# Patient Record
Sex: Female | Born: 1935 | Race: White | Hispanic: No | Smoking: Never smoker
Health system: Southern US, Community
[De-identification: ages and names within clinical notes are randomized; demographics above are authoritative.]

## PROBLEM LIST (undated history)

## (undated) DIAGNOSIS — R350 Frequency of micturition: Secondary | ICD-10-CM

## (undated) DIAGNOSIS — M51379 Other intervertebral disc degeneration, lumbosacral region without mention of lumbar back pain or lower extremity pain: Secondary | ICD-10-CM

## (undated) DIAGNOSIS — M810 Age-related osteoporosis without current pathological fracture: Secondary | ICD-10-CM

## (undated) DIAGNOSIS — R42 Dizziness and giddiness: Secondary | ICD-10-CM

## (undated) DIAGNOSIS — IMO0001 Reserved for inherently not codable concepts without codable children: Secondary | ICD-10-CM

## (undated) DIAGNOSIS — J309 Allergic rhinitis, unspecified: Secondary | ICD-10-CM

## (undated) DIAGNOSIS — M549 Dorsalgia, unspecified: Secondary | ICD-10-CM

## (undated) DIAGNOSIS — R55 Syncope and collapse: Secondary | ICD-10-CM

## (undated) DIAGNOSIS — I10 Essential (primary) hypertension: Secondary | ICD-10-CM

## (undated) DIAGNOSIS — R51 Headache: Secondary | ICD-10-CM

## (undated) DIAGNOSIS — K5732 Diverticulitis of large intestine without perforation or abscess without bleeding: Secondary | ICD-10-CM

## (undated) DIAGNOSIS — M6530 Trigger finger, unspecified finger: Secondary | ICD-10-CM

## (undated) DIAGNOSIS — E785 Hyperlipidemia, unspecified: Secondary | ICD-10-CM

## (undated) DIAGNOSIS — N3289 Other specified disorders of bladder: Secondary | ICD-10-CM

## (undated) DIAGNOSIS — M1990 Unspecified osteoarthritis, unspecified site: Secondary | ICD-10-CM

## (undated) DIAGNOSIS — R778 Other specified abnormalities of plasma proteins: Secondary | ICD-10-CM

## (undated) DIAGNOSIS — G4730 Sleep apnea, unspecified: Secondary | ICD-10-CM

## (undated) DIAGNOSIS — I700 Atherosclerosis of aorta: Secondary | ICD-10-CM

## (undated) DIAGNOSIS — M5137 Other intervertebral disc degeneration, lumbosacral region: Secondary | ICD-10-CM

## (undated) DIAGNOSIS — J3489 Other specified disorders of nose and nasal sinuses: Secondary | ICD-10-CM

## (undated) DIAGNOSIS — F341 Dysthymic disorder: Secondary | ICD-10-CM

## (undated) HISTORY — DX: Headache: R51

## (undated) HISTORY — DX: Reserved for inherently not codable concepts without codable children: IMO0001

## (undated) HISTORY — PX: CARDIAC CATHETERIZATION: SHX172

## (undated) HISTORY — PX: ADENOIDECTOMY: SUR15

## (undated) HISTORY — PX: CHOLECYSTECTOMY: SHX55

## (undated) HISTORY — PX: CATARACT EXTRACTION W/ INTRAOCULAR LENS IMPLANT & ANTERIOR VITRECTOMY, BILATERAL: SHX1310

## (undated) HISTORY — PX: OTHER SURGICAL HISTORY: SHX169

## (undated) HISTORY — DX: Other specified abnormalities of plasma proteins: R77.8

## (undated) HISTORY — DX: Other specified disorders of nose and nasal sinuses: J34.89

## (undated) HISTORY — PX: BREAST BIOPSY: SHX20

## (undated) HISTORY — PX: ROTATOR CUFF REPAIR: SHX139

## (undated) HISTORY — PX: TONSILLECTOMY: SUR1361

## (undated) HISTORY — PX: CARPAL TUNNEL RELEASE: SHX101

## (undated) HISTORY — DX: Diverticulitis of large intestine without perforation or abscess without bleeding: K57.32

## (undated) HISTORY — DX: Sleep apnea, unspecified: G47.30

## (undated) HISTORY — DX: Allergic rhinitis, unspecified: J30.9

## (undated) HISTORY — PX: TEMPORAL ARTERY BIOPSY / LIGATION: SUR132

## (undated) HISTORY — DX: Dizziness and giddiness: R42

## (undated) HISTORY — DX: Hyperlipidemia, unspecified: E78.5

## (undated) HISTORY — DX: Other specified disorders of bladder: N32.89

## (undated) HISTORY — DX: Other intervertebral disc degeneration, lumbosacral region: M51.37

## (undated) HISTORY — PX: ABDOMINAL HYSTERECTOMY: SHX81

## (undated) HISTORY — DX: Trigger finger, unspecified finger: M65.30

## (undated) HISTORY — DX: Other intervertebral disc degeneration, lumbosacral region without mention of lumbar back pain or lower extremity pain: M51.379

## (undated) HISTORY — DX: Dysthymic disorder: F34.1

## (undated) HISTORY — DX: Essential (primary) hypertension: I10

## (undated) HISTORY — PX: BREAST EXCISIONAL BIOPSY: SUR124

## (undated) HISTORY — DX: Frequency of micturition: R35.0

## (undated) HISTORY — PX: TUBAL LIGATION: SHX77

## (undated) HISTORY — DX: Atherosclerosis of aorta: I70.0

## (undated) HISTORY — PX: APPENDECTOMY: SHX54

## (undated) HISTORY — DX: Age-related osteoporosis without current pathological fracture: M81.0

---

## 2004-04-18 ENCOUNTER — Other Ambulatory Visit: Payer: Self-pay

## 2005-05-07 ENCOUNTER — Ambulatory Visit: Payer: Self-pay | Admitting: Anesthesiology

## 2005-06-27 ENCOUNTER — Ambulatory Visit: Payer: Self-pay | Admitting: Unknown Physician Specialty

## 2005-06-27 DIAGNOSIS — Z86010 Personal history of colonic polyps: Secondary | ICD-10-CM | POA: Insufficient documentation

## 2005-08-12 ENCOUNTER — Ambulatory Visit: Payer: Self-pay | Admitting: Anesthesiology

## 2005-10-31 ENCOUNTER — Ambulatory Visit: Payer: Self-pay | Admitting: Internal Medicine

## 2005-11-04 ENCOUNTER — Ambulatory Visit: Payer: Self-pay | Admitting: Anesthesiology

## 2006-01-28 ENCOUNTER — Ambulatory Visit: Payer: Self-pay | Admitting: Anesthesiology

## 2006-02-03 ENCOUNTER — Ambulatory Visit: Payer: Self-pay | Admitting: Anesthesiology

## 2006-02-12 ENCOUNTER — Ambulatory Visit: Payer: Self-pay | Admitting: Anesthesiology

## 2006-02-26 ENCOUNTER — Ambulatory Visit: Payer: Self-pay | Admitting: Anesthesiology

## 2006-04-22 ENCOUNTER — Ambulatory Visit: Payer: Self-pay | Admitting: Anesthesiology

## 2006-07-14 ENCOUNTER — Ambulatory Visit: Payer: Self-pay | Admitting: Anesthesiology

## 2006-09-07 ENCOUNTER — Ambulatory Visit: Payer: Self-pay | Admitting: Anesthesiology

## 2006-10-22 ENCOUNTER — Ambulatory Visit: Payer: Self-pay | Admitting: Anesthesiology

## 2006-11-03 ENCOUNTER — Ambulatory Visit: Payer: Self-pay | Admitting: Anesthesiology

## 2007-02-18 ENCOUNTER — Ambulatory Visit: Payer: Self-pay | Admitting: Anesthesiology

## 2007-04-13 ENCOUNTER — Ambulatory Visit: Payer: Self-pay | Admitting: Anesthesiology

## 2007-07-22 ENCOUNTER — Ambulatory Visit: Payer: Self-pay | Admitting: Urology

## 2007-09-20 ENCOUNTER — Ambulatory Visit: Payer: Self-pay | Admitting: Anesthesiology

## 2007-10-12 ENCOUNTER — Ambulatory Visit: Payer: Self-pay | Admitting: Anesthesiology

## 2007-11-03 ENCOUNTER — Ambulatory Visit: Payer: Self-pay | Admitting: Anesthesiology

## 2007-12-01 ENCOUNTER — Ambulatory Visit: Payer: Self-pay | Admitting: Anesthesiology

## 2007-12-16 ENCOUNTER — Ambulatory Visit: Payer: Self-pay | Admitting: Anesthesiology

## 2007-12-22 ENCOUNTER — Ambulatory Visit: Payer: Self-pay | Admitting: Unknown Physician Specialty

## 2008-01-17 ENCOUNTER — Ambulatory Visit: Payer: Self-pay | Admitting: Anesthesiology

## 2008-02-09 ENCOUNTER — Encounter: Payer: Self-pay | Admitting: Anesthesiology

## 2008-02-16 ENCOUNTER — Encounter: Payer: Self-pay | Admitting: Anesthesiology

## 2008-04-18 ENCOUNTER — Ambulatory Visit: Payer: Self-pay | Admitting: Anesthesiology

## 2008-08-22 ENCOUNTER — Encounter: Payer: Self-pay | Admitting: General Practice

## 2008-09-17 ENCOUNTER — Encounter: Payer: Self-pay | Admitting: General Practice

## 2008-09-27 ENCOUNTER — Ambulatory Visit: Payer: Self-pay | Admitting: Anesthesiology

## 2009-01-10 ENCOUNTER — Ambulatory Visit: Payer: Self-pay | Admitting: Unknown Physician Specialty

## 2009-01-23 ENCOUNTER — Ambulatory Visit: Payer: Self-pay | Admitting: Anesthesiology

## 2009-01-24 ENCOUNTER — Ambulatory Visit: Payer: Self-pay | Admitting: Unknown Physician Specialty

## 2009-04-10 ENCOUNTER — Encounter: Payer: Self-pay | Admitting: General Practice

## 2009-04-17 ENCOUNTER — Encounter: Payer: Self-pay | Admitting: General Practice

## 2009-05-29 ENCOUNTER — Ambulatory Visit: Payer: Self-pay | Admitting: Anesthesiology

## 2009-07-04 ENCOUNTER — Ambulatory Visit: Payer: Self-pay | Admitting: Anesthesiology

## 2009-08-14 ENCOUNTER — Ambulatory Visit: Payer: Self-pay | Admitting: Anesthesiology

## 2009-09-11 ENCOUNTER — Ambulatory Visit: Payer: Self-pay | Admitting: Anesthesiology

## 2009-12-06 ENCOUNTER — Ambulatory Visit: Payer: Self-pay | Admitting: Anesthesiology

## 2010-01-09 ENCOUNTER — Ambulatory Visit: Payer: Self-pay | Admitting: Unknown Physician Specialty

## 2010-01-17 ENCOUNTER — Ambulatory Visit: Payer: Self-pay | Admitting: Unknown Physician Specialty

## 2010-01-31 ENCOUNTER — Ambulatory Visit: Payer: Self-pay | Admitting: General Practice

## 2010-02-08 ENCOUNTER — Ambulatory Visit: Payer: Self-pay | Admitting: General Practice

## 2010-12-25 ENCOUNTER — Ambulatory Visit: Payer: Self-pay | Admitting: Anesthesiology

## 2011-01-23 ENCOUNTER — Ambulatory Visit: Payer: Self-pay | Admitting: Anesthesiology

## 2011-01-31 ENCOUNTER — Ambulatory Visit: Payer: Self-pay | Admitting: Unknown Physician Specialty

## 2011-02-12 ENCOUNTER — Ambulatory Visit: Payer: Self-pay | Admitting: Anesthesiology

## 2011-02-27 ENCOUNTER — Ambulatory Visit: Payer: Self-pay | Admitting: Anesthesiology

## 2011-07-26 ENCOUNTER — Ambulatory Visit: Payer: Self-pay | Admitting: Neurology

## 2011-08-27 ENCOUNTER — Emergency Department: Payer: Self-pay | Admitting: Internal Medicine

## 2011-09-10 ENCOUNTER — Ambulatory Visit: Payer: Self-pay | Admitting: Pain Medicine

## 2011-09-14 ENCOUNTER — Ambulatory Visit: Payer: Self-pay | Admitting: Pain Medicine

## 2011-09-18 ENCOUNTER — Ambulatory Visit: Payer: Self-pay | Admitting: Pain Medicine

## 2011-10-06 ENCOUNTER — Ambulatory Visit: Payer: Self-pay | Admitting: Pain Medicine

## 2011-10-22 ENCOUNTER — Ambulatory Visit: Payer: Self-pay | Admitting: Pain Medicine

## 2011-10-23 ENCOUNTER — Ambulatory Visit: Payer: Self-pay | Admitting: Pain Medicine

## 2011-11-05 ENCOUNTER — Ambulatory Visit: Payer: Self-pay | Admitting: Pain Medicine

## 2011-11-06 ENCOUNTER — Ambulatory Visit: Payer: Self-pay | Admitting: Pain Medicine

## 2011-11-24 ENCOUNTER — Ambulatory Visit: Payer: Self-pay | Admitting: Pain Medicine

## 2011-11-24 DIAGNOSIS — G8929 Other chronic pain: Secondary | ICD-10-CM | POA: Diagnosis not present

## 2011-11-24 DIAGNOSIS — M4120 Other idiopathic scoliosis, site unspecified: Secondary | ICD-10-CM | POA: Diagnosis not present

## 2011-11-24 DIAGNOSIS — K219 Gastro-esophageal reflux disease without esophagitis: Secondary | ICD-10-CM | POA: Diagnosis not present

## 2011-11-24 DIAGNOSIS — Z7982 Long term (current) use of aspirin: Secondary | ICD-10-CM | POA: Diagnosis not present

## 2011-11-24 DIAGNOSIS — M19019 Primary osteoarthritis, unspecified shoulder: Secondary | ICD-10-CM | POA: Diagnosis not present

## 2011-11-24 DIAGNOSIS — D649 Anemia, unspecified: Secondary | ICD-10-CM | POA: Diagnosis not present

## 2011-11-24 DIAGNOSIS — G4733 Obstructive sleep apnea (adult) (pediatric): Secondary | ICD-10-CM | POA: Diagnosis not present

## 2011-11-24 DIAGNOSIS — M5106 Intervertebral disc disorders with myelopathy, lumbar region: Secondary | ICD-10-CM | POA: Diagnosis not present

## 2011-11-24 DIAGNOSIS — M79609 Pain in unspecified limb: Secondary | ICD-10-CM | POA: Diagnosis not present

## 2011-11-24 DIAGNOSIS — M13819 Other specified arthritis, unspecified shoulder: Secondary | ICD-10-CM | POA: Diagnosis not present

## 2011-11-24 DIAGNOSIS — Z9889 Other specified postprocedural states: Secondary | ICD-10-CM | POA: Diagnosis not present

## 2011-11-24 DIAGNOSIS — F329 Major depressive disorder, single episode, unspecified: Secondary | ICD-10-CM | POA: Diagnosis not present

## 2011-11-24 DIAGNOSIS — K279 Peptic ulcer, site unspecified, unspecified as acute or chronic, without hemorrhage or perforation: Secondary | ICD-10-CM | POA: Diagnosis not present

## 2011-11-24 DIAGNOSIS — M5000 Cervical disc disorder with myelopathy, unspecified cervical region: Secondary | ICD-10-CM | POA: Diagnosis not present

## 2011-11-24 DIAGNOSIS — I10 Essential (primary) hypertension: Secondary | ICD-10-CM | POA: Diagnosis not present

## 2011-11-24 DIAGNOSIS — IMO0002 Reserved for concepts with insufficient information to code with codable children: Secondary | ICD-10-CM | POA: Diagnosis not present

## 2011-11-24 DIAGNOSIS — Z6372 Alcoholism and drug addiction in family: Secondary | ICD-10-CM | POA: Diagnosis not present

## 2011-11-24 DIAGNOSIS — M545 Low back pain, unspecified: Secondary | ICD-10-CM | POA: Diagnosis not present

## 2011-11-24 DIAGNOSIS — M25519 Pain in unspecified shoulder: Secondary | ICD-10-CM | POA: Diagnosis not present

## 2011-11-24 DIAGNOSIS — Z79899 Other long term (current) drug therapy: Secondary | ICD-10-CM | POA: Diagnosis not present

## 2011-11-24 DIAGNOSIS — M542 Cervicalgia: Secondary | ICD-10-CM | POA: Diagnosis not present

## 2011-12-09 DIAGNOSIS — R1310 Dysphagia, unspecified: Secondary | ICD-10-CM | POA: Diagnosis not present

## 2011-12-09 DIAGNOSIS — J029 Acute pharyngitis, unspecified: Secondary | ICD-10-CM | POA: Diagnosis not present

## 2011-12-09 DIAGNOSIS — M129 Arthropathy, unspecified: Secondary | ICD-10-CM | POA: Diagnosis not present

## 2011-12-09 DIAGNOSIS — I10 Essential (primary) hypertension: Secondary | ICD-10-CM | POA: Diagnosis not present

## 2011-12-15 DIAGNOSIS — M531 Cervicobrachial syndrome: Secondary | ICD-10-CM | POA: Diagnosis not present

## 2012-02-20 ENCOUNTER — Ambulatory Visit: Payer: Self-pay | Admitting: Family Medicine

## 2012-02-20 DIAGNOSIS — Z1231 Encounter for screening mammogram for malignant neoplasm of breast: Secondary | ICD-10-CM | POA: Diagnosis not present

## 2012-02-20 DIAGNOSIS — R928 Other abnormal and inconclusive findings on diagnostic imaging of breast: Secondary | ICD-10-CM | POA: Diagnosis not present

## 2012-03-10 DIAGNOSIS — E559 Vitamin D deficiency, unspecified: Secondary | ICD-10-CM | POA: Diagnosis not present

## 2012-03-10 DIAGNOSIS — E785 Hyperlipidemia, unspecified: Secondary | ICD-10-CM | POA: Diagnosis not present

## 2012-03-10 DIAGNOSIS — I10 Essential (primary) hypertension: Secondary | ICD-10-CM | POA: Diagnosis not present

## 2012-03-10 DIAGNOSIS — IMO0001 Reserved for inherently not codable concepts without codable children: Secondary | ICD-10-CM | POA: Diagnosis not present

## 2012-03-10 DIAGNOSIS — R252 Cramp and spasm: Secondary | ICD-10-CM | POA: Diagnosis not present

## 2012-03-10 DIAGNOSIS — R51 Headache: Secondary | ICD-10-CM | POA: Diagnosis not present

## 2012-03-10 DIAGNOSIS — R9389 Abnormal findings on diagnostic imaging of other specified body structures: Secondary | ICD-10-CM | POA: Diagnosis not present

## 2012-03-22 ENCOUNTER — Encounter: Payer: Self-pay | Admitting: Family Medicine

## 2012-03-22 DIAGNOSIS — M542 Cervicalgia: Secondary | ICD-10-CM | POA: Diagnosis not present

## 2012-03-22 DIAGNOSIS — IMO0001 Reserved for inherently not codable concepts without codable children: Secondary | ICD-10-CM | POA: Diagnosis not present

## 2012-03-22 DIAGNOSIS — M2560 Stiffness of unspecified joint, not elsewhere classified: Secondary | ICD-10-CM | POA: Diagnosis not present

## 2012-03-23 ENCOUNTER — Ambulatory Visit: Payer: Self-pay | Admitting: Family Medicine

## 2012-03-23 DIAGNOSIS — M47812 Spondylosis without myelopathy or radiculopathy, cervical region: Secondary | ICD-10-CM | POA: Diagnosis not present

## 2012-03-23 DIAGNOSIS — M5030 Other cervical disc degeneration, unspecified cervical region: Secondary | ICD-10-CM | POA: Diagnosis not present

## 2012-03-26 DIAGNOSIS — M19049 Primary osteoarthritis, unspecified hand: Secondary | ICD-10-CM | POA: Diagnosis not present

## 2012-03-26 DIAGNOSIS — G5600 Carpal tunnel syndrome, unspecified upper limb: Secondary | ICD-10-CM | POA: Diagnosis not present

## 2012-03-26 DIAGNOSIS — M19039 Primary osteoarthritis, unspecified wrist: Secondary | ICD-10-CM | POA: Diagnosis not present

## 2012-03-30 ENCOUNTER — Ambulatory Visit: Payer: Self-pay | Admitting: Family Medicine

## 2012-03-30 DIAGNOSIS — R937 Abnormal findings on diagnostic imaging of other parts of musculoskeletal system: Secondary | ICD-10-CM | POA: Diagnosis not present

## 2012-03-30 DIAGNOSIS — G5630 Lesion of radial nerve, unspecified upper limb: Secondary | ICD-10-CM | POA: Diagnosis not present

## 2012-03-30 DIAGNOSIS — E559 Vitamin D deficiency, unspecified: Secondary | ICD-10-CM | POA: Diagnosis not present

## 2012-03-30 DIAGNOSIS — G5600 Carpal tunnel syndrome, unspecified upper limb: Secondary | ICD-10-CM | POA: Diagnosis not present

## 2012-03-30 DIAGNOSIS — M899 Disorder of bone, unspecified: Secondary | ICD-10-CM | POA: Diagnosis not present

## 2012-03-30 DIAGNOSIS — M542 Cervicalgia: Secondary | ICD-10-CM | POA: Diagnosis not present

## 2012-03-30 DIAGNOSIS — M810 Age-related osteoporosis without current pathological fracture: Secondary | ICD-10-CM | POA: Diagnosis not present

## 2012-04-08 DIAGNOSIS — G4733 Obstructive sleep apnea (adult) (pediatric): Secondary | ICD-10-CM | POA: Diagnosis not present

## 2012-04-17 ENCOUNTER — Encounter: Payer: Self-pay | Admitting: Family Medicine

## 2012-04-22 DIAGNOSIS — Z961 Presence of intraocular lens: Secondary | ICD-10-CM | POA: Diagnosis not present

## 2012-04-22 DIAGNOSIS — H2510 Age-related nuclear cataract, unspecified eye: Secondary | ICD-10-CM | POA: Diagnosis not present

## 2012-05-26 DIAGNOSIS — R142 Eructation: Secondary | ICD-10-CM | POA: Diagnosis not present

## 2012-05-26 DIAGNOSIS — I10 Essential (primary) hypertension: Secondary | ICD-10-CM | POA: Diagnosis not present

## 2012-05-26 DIAGNOSIS — Z86010 Personal history of colonic polyps: Secondary | ICD-10-CM | POA: Diagnosis not present

## 2012-05-26 DIAGNOSIS — R198 Other specified symptoms and signs involving the digestive system and abdomen: Secondary | ICD-10-CM | POA: Diagnosis not present

## 2012-05-26 DIAGNOSIS — R141 Gas pain: Secondary | ICD-10-CM | POA: Diagnosis not present

## 2012-05-26 DIAGNOSIS — E785 Hyperlipidemia, unspecified: Secondary | ICD-10-CM | POA: Diagnosis not present

## 2012-05-26 DIAGNOSIS — R143 Flatulence: Secondary | ICD-10-CM | POA: Diagnosis not present

## 2012-05-31 ENCOUNTER — Ambulatory Visit: Payer: Self-pay | Admitting: Family Medicine

## 2012-05-31 DIAGNOSIS — R141 Gas pain: Secondary | ICD-10-CM | POA: Diagnosis not present

## 2012-05-31 DIAGNOSIS — R143 Flatulence: Secondary | ICD-10-CM | POA: Diagnosis not present

## 2012-08-13 DIAGNOSIS — M76899 Other specified enthesopathies of unspecified lower limb, excluding foot: Secondary | ICD-10-CM | POA: Diagnosis not present

## 2012-08-30 DIAGNOSIS — F341 Dysthymic disorder: Secondary | ICD-10-CM | POA: Diagnosis not present

## 2012-08-30 DIAGNOSIS — M5137 Other intervertebral disc degeneration, lumbosacral region: Secondary | ICD-10-CM | POA: Diagnosis not present

## 2012-08-30 DIAGNOSIS — I10 Essential (primary) hypertension: Secondary | ICD-10-CM | POA: Diagnosis not present

## 2012-08-30 DIAGNOSIS — E785 Hyperlipidemia, unspecified: Secondary | ICD-10-CM | POA: Diagnosis not present

## 2012-08-31 DIAGNOSIS — M25559 Pain in unspecified hip: Secondary | ICD-10-CM | POA: Diagnosis not present

## 2012-08-31 DIAGNOSIS — IMO0001 Reserved for inherently not codable concepts without codable children: Secondary | ICD-10-CM | POA: Diagnosis not present

## 2012-08-31 DIAGNOSIS — M6281 Muscle weakness (generalized): Secondary | ICD-10-CM | POA: Diagnosis not present

## 2012-08-31 DIAGNOSIS — R262 Difficulty in walking, not elsewhere classified: Secondary | ICD-10-CM | POA: Diagnosis not present

## 2012-09-02 DIAGNOSIS — L821 Other seborrheic keratosis: Secondary | ICD-10-CM | POA: Diagnosis not present

## 2012-09-02 DIAGNOSIS — D1800 Hemangioma unspecified site: Secondary | ICD-10-CM | POA: Diagnosis not present

## 2012-09-02 DIAGNOSIS — L723 Sebaceous cyst: Secondary | ICD-10-CM | POA: Diagnosis not present

## 2012-09-09 DIAGNOSIS — Z23 Encounter for immunization: Secondary | ICD-10-CM | POA: Diagnosis not present

## 2012-11-30 DIAGNOSIS — E785 Hyperlipidemia, unspecified: Secondary | ICD-10-CM | POA: Diagnosis not present

## 2012-11-30 DIAGNOSIS — M542 Cervicalgia: Secondary | ICD-10-CM | POA: Diagnosis not present

## 2012-11-30 DIAGNOSIS — J309 Allergic rhinitis, unspecified: Secondary | ICD-10-CM | POA: Diagnosis not present

## 2012-11-30 DIAGNOSIS — I10 Essential (primary) hypertension: Secondary | ICD-10-CM | POA: Diagnosis not present

## 2012-11-30 DIAGNOSIS — R198 Other specified symptoms and signs involving the digestive system and abdomen: Secondary | ICD-10-CM | POA: Diagnosis not present

## 2012-12-07 DIAGNOSIS — M542 Cervicalgia: Secondary | ICD-10-CM | POA: Diagnosis not present

## 2012-12-07 DIAGNOSIS — M899 Disorder of bone, unspecified: Secondary | ICD-10-CM | POA: Diagnosis not present

## 2012-12-07 DIAGNOSIS — E785 Hyperlipidemia, unspecified: Secondary | ICD-10-CM | POA: Diagnosis not present

## 2012-12-07 DIAGNOSIS — F341 Dysthymic disorder: Secondary | ICD-10-CM | POA: Diagnosis not present

## 2012-12-07 DIAGNOSIS — M949 Disorder of cartilage, unspecified: Secondary | ICD-10-CM | POA: Diagnosis not present

## 2012-12-13 ENCOUNTER — Ambulatory Visit: Payer: Self-pay | Admitting: Family Medicine

## 2012-12-13 DIAGNOSIS — M47812 Spondylosis without myelopathy or radiculopathy, cervical region: Secondary | ICD-10-CM | POA: Diagnosis not present

## 2012-12-13 DIAGNOSIS — M542 Cervicalgia: Secondary | ICD-10-CM | POA: Diagnosis not present

## 2012-12-13 DIAGNOSIS — M5020 Other cervical disc displacement, unspecified cervical region: Secondary | ICD-10-CM | POA: Diagnosis not present

## 2012-12-21 DIAGNOSIS — R110 Nausea: Secondary | ICD-10-CM | POA: Diagnosis not present

## 2012-12-21 DIAGNOSIS — K5900 Constipation, unspecified: Secondary | ICD-10-CM | POA: Diagnosis not present

## 2012-12-21 DIAGNOSIS — Z86010 Personal history of colonic polyps: Secondary | ICD-10-CM | POA: Diagnosis not present

## 2012-12-29 ENCOUNTER — Ambulatory Visit: Payer: Self-pay | Admitting: Pain Medicine

## 2012-12-29 DIAGNOSIS — M5030 Other cervical disc degeneration, unspecified cervical region: Secondary | ICD-10-CM | POA: Diagnosis not present

## 2012-12-29 DIAGNOSIS — M4712 Other spondylosis with myelopathy, cervical region: Secondary | ICD-10-CM | POA: Diagnosis not present

## 2012-12-29 DIAGNOSIS — G8929 Other chronic pain: Secondary | ICD-10-CM | POA: Diagnosis not present

## 2012-12-29 DIAGNOSIS — Z6379 Other stressful life events affecting family and household: Secondary | ICD-10-CM | POA: Diagnosis not present

## 2012-12-29 DIAGNOSIS — G894 Chronic pain syndrome: Secondary | ICD-10-CM | POA: Diagnosis not present

## 2012-12-29 DIAGNOSIS — K589 Irritable bowel syndrome without diarrhea: Secondary | ICD-10-CM | POA: Diagnosis not present

## 2012-12-29 DIAGNOSIS — G4733 Obstructive sleep apnea (adult) (pediatric): Secondary | ICD-10-CM | POA: Diagnosis not present

## 2012-12-29 DIAGNOSIS — M5412 Radiculopathy, cervical region: Secondary | ICD-10-CM | POA: Diagnosis not present

## 2012-12-29 DIAGNOSIS — K219 Gastro-esophageal reflux disease without esophagitis: Secondary | ICD-10-CM | POA: Diagnosis not present

## 2012-12-29 DIAGNOSIS — Z7982 Long term (current) use of aspirin: Secondary | ICD-10-CM | POA: Diagnosis not present

## 2012-12-29 DIAGNOSIS — K279 Peptic ulcer, site unspecified, unspecified as acute or chronic, without hemorrhage or perforation: Secondary | ICD-10-CM | POA: Diagnosis not present

## 2012-12-29 DIAGNOSIS — M1990 Unspecified osteoarthritis, unspecified site: Secondary | ICD-10-CM | POA: Diagnosis not present

## 2012-12-29 DIAGNOSIS — M5137 Other intervertebral disc degeneration, lumbosacral region: Secondary | ICD-10-CM | POA: Diagnosis not present

## 2012-12-29 DIAGNOSIS — M4120 Other idiopathic scoliosis, site unspecified: Secondary | ICD-10-CM | POA: Diagnosis not present

## 2012-12-29 DIAGNOSIS — M4716 Other spondylosis with myelopathy, lumbar region: Secondary | ICD-10-CM | POA: Diagnosis not present

## 2012-12-29 DIAGNOSIS — Z862 Personal history of diseases of the blood and blood-forming organs and certain disorders involving the immune mechanism: Secondary | ICD-10-CM | POA: Diagnosis not present

## 2012-12-29 DIAGNOSIS — F329 Major depressive disorder, single episode, unspecified: Secondary | ICD-10-CM | POA: Diagnosis not present

## 2012-12-29 DIAGNOSIS — I10 Essential (primary) hypertension: Secondary | ICD-10-CM | POA: Diagnosis not present

## 2012-12-29 DIAGNOSIS — M542 Cervicalgia: Secondary | ICD-10-CM | POA: Diagnosis not present

## 2012-12-29 DIAGNOSIS — Z9889 Other specified postprocedural states: Secondary | ICD-10-CM | POA: Diagnosis not present

## 2013-01-04 DIAGNOSIS — M531 Cervicobrachial syndrome: Secondary | ICD-10-CM | POA: Diagnosis not present

## 2013-01-06 ENCOUNTER — Ambulatory Visit: Payer: Self-pay | Admitting: Pain Medicine

## 2013-01-06 DIAGNOSIS — F411 Generalized anxiety disorder: Secondary | ICD-10-CM | POA: Diagnosis not present

## 2013-01-06 DIAGNOSIS — M5412 Radiculopathy, cervical region: Secondary | ICD-10-CM | POA: Diagnosis not present

## 2013-01-06 DIAGNOSIS — F331 Major depressive disorder, recurrent, moderate: Secondary | ICD-10-CM | POA: Diagnosis not present

## 2013-01-18 DIAGNOSIS — M531 Cervicobrachial syndrome: Secondary | ICD-10-CM | POA: Diagnosis not present

## 2013-01-24 ENCOUNTER — Ambulatory Visit: Payer: Self-pay | Admitting: Unknown Physician Specialty

## 2013-01-24 DIAGNOSIS — Z8371 Family history of colonic polyps: Secondary | ICD-10-CM | POA: Diagnosis not present

## 2013-01-24 DIAGNOSIS — K2970 Gastritis, unspecified, without bleeding: Secondary | ICD-10-CM | POA: Diagnosis not present

## 2013-01-24 DIAGNOSIS — Z79899 Other long term (current) drug therapy: Secondary | ICD-10-CM | POA: Diagnosis not present

## 2013-01-24 DIAGNOSIS — Z885 Allergy status to narcotic agent status: Secondary | ICD-10-CM | POA: Diagnosis not present

## 2013-01-24 DIAGNOSIS — D649 Anemia, unspecified: Secondary | ICD-10-CM | POA: Diagnosis not present

## 2013-01-24 DIAGNOSIS — E785 Hyperlipidemia, unspecified: Secondary | ICD-10-CM | POA: Diagnosis not present

## 2013-01-24 DIAGNOSIS — Z7982 Long term (current) use of aspirin: Secondary | ICD-10-CM | POA: Diagnosis not present

## 2013-01-24 DIAGNOSIS — Z860100 Personal history of colon polyps, unspecified: Secondary | ICD-10-CM | POA: Diagnosis not present

## 2013-01-24 DIAGNOSIS — K449 Diaphragmatic hernia without obstruction or gangrene: Secondary | ICD-10-CM | POA: Diagnosis not present

## 2013-01-24 DIAGNOSIS — G4730 Sleep apnea, unspecified: Secondary | ICD-10-CM | POA: Diagnosis not present

## 2013-01-24 DIAGNOSIS — D126 Benign neoplasm of colon, unspecified: Secondary | ICD-10-CM | POA: Diagnosis not present

## 2013-01-24 DIAGNOSIS — Z881 Allergy status to other antibiotic agents status: Secondary | ICD-10-CM | POA: Diagnosis not present

## 2013-01-24 DIAGNOSIS — K219 Gastro-esophageal reflux disease without esophagitis: Secondary | ICD-10-CM | POA: Diagnosis not present

## 2013-01-24 DIAGNOSIS — I10 Essential (primary) hypertension: Secondary | ICD-10-CM | POA: Diagnosis not present

## 2013-01-24 DIAGNOSIS — M549 Dorsalgia, unspecified: Secondary | ICD-10-CM | POA: Diagnosis not present

## 2013-01-24 DIAGNOSIS — R198 Other specified symptoms and signs involving the digestive system and abdomen: Secondary | ICD-10-CM | POA: Diagnosis not present

## 2013-01-24 DIAGNOSIS — F329 Major depressive disorder, single episode, unspecified: Secondary | ICD-10-CM | POA: Diagnosis not present

## 2013-01-24 DIAGNOSIS — K5900 Constipation, unspecified: Secondary | ICD-10-CM | POA: Diagnosis not present

## 2013-01-24 DIAGNOSIS — Z09 Encounter for follow-up examination after completed treatment for conditions other than malignant neoplasm: Secondary | ICD-10-CM | POA: Diagnosis not present

## 2013-01-24 DIAGNOSIS — Z888 Allergy status to other drugs, medicaments and biological substances status: Secondary | ICD-10-CM | POA: Diagnosis not present

## 2013-01-24 DIAGNOSIS — K589 Irritable bowel syndrome without diarrhea: Secondary | ICD-10-CM | POA: Diagnosis not present

## 2013-01-24 DIAGNOSIS — R110 Nausea: Secondary | ICD-10-CM | POA: Diagnosis not present

## 2013-01-24 DIAGNOSIS — F411 Generalized anxiety disorder: Secondary | ICD-10-CM | POA: Diagnosis not present

## 2013-01-24 DIAGNOSIS — K5730 Diverticulosis of large intestine without perforation or abscess without bleeding: Secondary | ICD-10-CM | POA: Diagnosis not present

## 2013-01-24 DIAGNOSIS — K2940 Chronic atrophic gastritis without bleeding: Secondary | ICD-10-CM | POA: Diagnosis not present

## 2013-01-24 DIAGNOSIS — Z886 Allergy status to analgesic agent status: Secondary | ICD-10-CM | POA: Diagnosis not present

## 2013-01-26 LAB — PATHOLOGY REPORT

## 2013-01-27 ENCOUNTER — Ambulatory Visit: Payer: Self-pay | Admitting: Pain Medicine

## 2013-01-27 DIAGNOSIS — IMO0002 Reserved for concepts with insufficient information to code with codable children: Secondary | ICD-10-CM | POA: Diagnosis not present

## 2013-01-28 DIAGNOSIS — F411 Generalized anxiety disorder: Secondary | ICD-10-CM | POA: Diagnosis not present

## 2013-01-28 DIAGNOSIS — F331 Major depressive disorder, recurrent, moderate: Secondary | ICD-10-CM | POA: Diagnosis not present

## 2013-02-09 ENCOUNTER — Ambulatory Visit: Payer: Self-pay | Admitting: Pain Medicine

## 2013-02-09 DIAGNOSIS — M5030 Other cervical disc degeneration, unspecified cervical region: Secondary | ICD-10-CM | POA: Diagnosis not present

## 2013-02-09 DIAGNOSIS — M542 Cervicalgia: Secondary | ICD-10-CM | POA: Diagnosis not present

## 2013-02-09 DIAGNOSIS — M4716 Other spondylosis with myelopathy, lumbar region: Secondary | ICD-10-CM | POA: Diagnosis not present

## 2013-02-09 DIAGNOSIS — F329 Major depressive disorder, single episode, unspecified: Secondary | ICD-10-CM | POA: Diagnosis not present

## 2013-02-09 DIAGNOSIS — G894 Chronic pain syndrome: Secondary | ICD-10-CM | POA: Diagnosis not present

## 2013-02-09 DIAGNOSIS — I10 Essential (primary) hypertension: Secondary | ICD-10-CM | POA: Diagnosis not present

## 2013-02-09 DIAGNOSIS — M5137 Other intervertebral disc degeneration, lumbosacral region: Secondary | ICD-10-CM | POA: Diagnosis not present

## 2013-02-09 DIAGNOSIS — M4712 Other spondylosis with myelopathy, cervical region: Secondary | ICD-10-CM | POA: Diagnosis not present

## 2013-02-09 DIAGNOSIS — M4120 Other idiopathic scoliosis, site unspecified: Secondary | ICD-10-CM | POA: Diagnosis not present

## 2013-02-09 DIAGNOSIS — K219 Gastro-esophageal reflux disease without esophagitis: Secondary | ICD-10-CM | POA: Diagnosis not present

## 2013-02-09 DIAGNOSIS — G8929 Other chronic pain: Secondary | ICD-10-CM | POA: Diagnosis not present

## 2013-02-09 DIAGNOSIS — Z862 Personal history of diseases of the blood and blood-forming organs and certain disorders involving the immune mechanism: Secondary | ICD-10-CM | POA: Diagnosis not present

## 2013-02-09 DIAGNOSIS — Z6379 Other stressful life events affecting family and household: Secondary | ICD-10-CM | POA: Diagnosis not present

## 2013-02-09 DIAGNOSIS — M5412 Radiculopathy, cervical region: Secondary | ICD-10-CM | POA: Diagnosis not present

## 2013-02-09 DIAGNOSIS — K279 Peptic ulcer, site unspecified, unspecified as acute or chronic, without hemorrhage or perforation: Secondary | ICD-10-CM | POA: Diagnosis not present

## 2013-02-09 DIAGNOSIS — G4733 Obstructive sleep apnea (adult) (pediatric): Secondary | ICD-10-CM | POA: Diagnosis not present

## 2013-02-14 DIAGNOSIS — K2940 Chronic atrophic gastritis without bleeding: Secondary | ICD-10-CM | POA: Diagnosis not present

## 2013-02-14 DIAGNOSIS — Z86010 Personal history of colonic polyps: Secondary | ICD-10-CM | POA: Diagnosis not present

## 2013-02-14 DIAGNOSIS — F411 Generalized anxiety disorder: Secondary | ICD-10-CM | POA: Diagnosis not present

## 2013-02-14 DIAGNOSIS — F331 Major depressive disorder, recurrent, moderate: Secondary | ICD-10-CM | POA: Diagnosis not present

## 2013-02-25 ENCOUNTER — Ambulatory Visit: Payer: Self-pay | Admitting: Family Medicine

## 2013-02-25 DIAGNOSIS — Z1231 Encounter for screening mammogram for malignant neoplasm of breast: Secondary | ICD-10-CM | POA: Diagnosis not present

## 2013-02-25 DIAGNOSIS — R928 Other abnormal and inconclusive findings on diagnostic imaging of breast: Secondary | ICD-10-CM | POA: Diagnosis not present

## 2013-02-28 DIAGNOSIS — G44229 Chronic tension-type headache, not intractable: Secondary | ICD-10-CM | POA: Diagnosis not present

## 2013-02-28 DIAGNOSIS — G4733 Obstructive sleep apnea (adult) (pediatric): Secondary | ICD-10-CM | POA: Diagnosis not present

## 2013-02-28 DIAGNOSIS — M531 Cervicobrachial syndrome: Secondary | ICD-10-CM | POA: Diagnosis not present

## 2013-03-07 DIAGNOSIS — I10 Essential (primary) hypertension: Secondary | ICD-10-CM | POA: Diagnosis not present

## 2013-03-07 DIAGNOSIS — K219 Gastro-esophageal reflux disease without esophagitis: Secondary | ICD-10-CM | POA: Diagnosis not present

## 2013-03-07 DIAGNOSIS — E785 Hyperlipidemia, unspecified: Secondary | ICD-10-CM | POA: Diagnosis not present

## 2013-03-18 DIAGNOSIS — F331 Major depressive disorder, recurrent, moderate: Secondary | ICD-10-CM | POA: Diagnosis not present

## 2013-03-18 DIAGNOSIS — F411 Generalized anxiety disorder: Secondary | ICD-10-CM | POA: Diagnosis not present

## 2013-04-12 DIAGNOSIS — J309 Allergic rhinitis, unspecified: Secondary | ICD-10-CM | POA: Diagnosis not present

## 2013-04-12 DIAGNOSIS — F341 Dysthymic disorder: Secondary | ICD-10-CM | POA: Diagnosis not present

## 2013-04-12 DIAGNOSIS — R6884 Jaw pain: Secondary | ICD-10-CM | POA: Diagnosis not present

## 2013-04-14 DIAGNOSIS — J3489 Other specified disorders of nose and nasal sinuses: Secondary | ICD-10-CM | POA: Diagnosis not present

## 2013-04-14 DIAGNOSIS — J301 Allergic rhinitis due to pollen: Secondary | ICD-10-CM | POA: Diagnosis not present

## 2013-04-14 DIAGNOSIS — K1120 Sialoadenitis, unspecified: Secondary | ICD-10-CM | POA: Diagnosis not present

## 2013-04-21 DIAGNOSIS — L509 Urticaria, unspecified: Secondary | ICD-10-CM | POA: Diagnosis not present

## 2013-04-21 DIAGNOSIS — K1120 Sialoadenitis, unspecified: Secondary | ICD-10-CM | POA: Diagnosis not present

## 2013-04-22 DIAGNOSIS — F411 Generalized anxiety disorder: Secondary | ICD-10-CM | POA: Diagnosis not present

## 2013-04-22 DIAGNOSIS — F331 Major depressive disorder, recurrent, moderate: Secondary | ICD-10-CM | POA: Diagnosis not present

## 2013-05-02 DIAGNOSIS — K1120 Sialoadenitis, unspecified: Secondary | ICD-10-CM | POA: Diagnosis not present

## 2013-05-02 DIAGNOSIS — J301 Allergic rhinitis due to pollen: Secondary | ICD-10-CM | POA: Diagnosis not present

## 2013-05-23 DIAGNOSIS — K1120 Sialoadenitis, unspecified: Secondary | ICD-10-CM | POA: Diagnosis not present

## 2013-05-23 DIAGNOSIS — J301 Allergic rhinitis due to pollen: Secondary | ICD-10-CM | POA: Diagnosis not present

## 2013-06-01 ENCOUNTER — Ambulatory Visit: Payer: Self-pay | Admitting: Otolaryngology

## 2013-06-01 DIAGNOSIS — K1120 Sialoadenitis, unspecified: Secondary | ICD-10-CM | POA: Diagnosis not present

## 2013-06-03 DIAGNOSIS — H43819 Vitreous degeneration, unspecified eye: Secondary | ICD-10-CM | POA: Diagnosis not present

## 2013-06-15 DIAGNOSIS — F329 Major depressive disorder, single episode, unspecified: Secondary | ICD-10-CM | POA: Diagnosis not present

## 2013-06-15 DIAGNOSIS — F331 Major depressive disorder, recurrent, moderate: Secondary | ICD-10-CM | POA: Diagnosis not present

## 2013-06-15 DIAGNOSIS — F411 Generalized anxiety disorder: Secondary | ICD-10-CM | POA: Diagnosis not present

## 2013-06-15 DIAGNOSIS — M531 Cervicobrachial syndrome: Secondary | ICD-10-CM | POA: Diagnosis not present

## 2013-06-17 DIAGNOSIS — K1120 Sialoadenitis, unspecified: Secondary | ICD-10-CM | POA: Diagnosis not present

## 2013-06-17 DIAGNOSIS — J301 Allergic rhinitis due to pollen: Secondary | ICD-10-CM | POA: Diagnosis not present

## 2013-06-21 DIAGNOSIS — N3289 Other specified disorders of bladder: Secondary | ICD-10-CM | POA: Diagnosis not present

## 2013-06-21 DIAGNOSIS — R300 Dysuria: Secondary | ICD-10-CM | POA: Diagnosis not present

## 2013-06-21 DIAGNOSIS — R350 Frequency of micturition: Secondary | ICD-10-CM | POA: Diagnosis not present

## 2013-06-21 DIAGNOSIS — N390 Urinary tract infection, site not specified: Secondary | ICD-10-CM | POA: Diagnosis not present

## 2013-07-04 DIAGNOSIS — R350 Frequency of micturition: Secondary | ICD-10-CM | POA: Diagnosis not present

## 2013-07-06 DIAGNOSIS — K219 Gastro-esophageal reflux disease without esophagitis: Secondary | ICD-10-CM | POA: Diagnosis not present

## 2013-07-06 DIAGNOSIS — I10 Essential (primary) hypertension: Secondary | ICD-10-CM | POA: Diagnosis not present

## 2013-07-06 DIAGNOSIS — M542 Cervicalgia: Secondary | ICD-10-CM | POA: Diagnosis not present

## 2013-07-06 DIAGNOSIS — E785 Hyperlipidemia, unspecified: Secondary | ICD-10-CM | POA: Diagnosis not present

## 2013-08-11 DIAGNOSIS — G5600 Carpal tunnel syndrome, unspecified upper limb: Secondary | ICD-10-CM | POA: Diagnosis not present

## 2013-08-11 DIAGNOSIS — R51 Headache: Secondary | ICD-10-CM | POA: Diagnosis not present

## 2013-08-11 DIAGNOSIS — M531 Cervicobrachial syndrome: Secondary | ICD-10-CM | POA: Diagnosis not present

## 2013-08-29 DIAGNOSIS — R350 Frequency of micturition: Secondary | ICD-10-CM | POA: Diagnosis not present

## 2013-09-01 DIAGNOSIS — Z23 Encounter for immunization: Secondary | ICD-10-CM | POA: Diagnosis not present

## 2013-09-02 DIAGNOSIS — M67919 Unspecified disorder of synovium and tendon, unspecified shoulder: Secondary | ICD-10-CM | POA: Diagnosis not present

## 2013-09-02 DIAGNOSIS — M19049 Primary osteoarthritis, unspecified hand: Secondary | ICD-10-CM | POA: Diagnosis not present

## 2013-09-02 DIAGNOSIS — M1710 Unilateral primary osteoarthritis, unspecified knee: Secondary | ICD-10-CM | POA: Diagnosis not present

## 2013-09-02 DIAGNOSIS — IMO0002 Reserved for concepts with insufficient information to code with codable children: Secondary | ICD-10-CM | POA: Diagnosis not present

## 2013-09-14 ENCOUNTER — Ambulatory Visit: Payer: Self-pay | Admitting: Pain Medicine

## 2013-09-14 DIAGNOSIS — M4716 Other spondylosis with myelopathy, lumbar region: Secondary | ICD-10-CM | POA: Diagnosis not present

## 2013-09-14 DIAGNOSIS — M5030 Other cervical disc degeneration, unspecified cervical region: Secondary | ICD-10-CM | POA: Diagnosis not present

## 2013-09-14 DIAGNOSIS — K279 Peptic ulcer, site unspecified, unspecified as acute or chronic, without hemorrhage or perforation: Secondary | ICD-10-CM | POA: Diagnosis not present

## 2013-09-14 DIAGNOSIS — M5137 Other intervertebral disc degeneration, lumbosacral region: Secondary | ICD-10-CM | POA: Diagnosis not present

## 2013-09-14 DIAGNOSIS — M542 Cervicalgia: Secondary | ICD-10-CM | POA: Diagnosis not present

## 2013-09-14 DIAGNOSIS — M4712 Other spondylosis with myelopathy, cervical region: Secondary | ICD-10-CM | POA: Diagnosis not present

## 2013-09-14 DIAGNOSIS — IMO0001 Reserved for inherently not codable concepts without codable children: Secondary | ICD-10-CM | POA: Diagnosis not present

## 2013-09-14 DIAGNOSIS — Z862 Personal history of diseases of the blood and blood-forming organs and certain disorders involving the immune mechanism: Secondary | ICD-10-CM | POA: Diagnosis not present

## 2013-09-14 DIAGNOSIS — K589 Irritable bowel syndrome without diarrhea: Secondary | ICD-10-CM | POA: Diagnosis not present

## 2013-09-14 DIAGNOSIS — Z79899 Other long term (current) drug therapy: Secondary | ICD-10-CM | POA: Diagnosis not present

## 2013-09-14 DIAGNOSIS — Z6379 Other stressful life events affecting family and household: Secondary | ICD-10-CM | POA: Diagnosis not present

## 2013-09-14 DIAGNOSIS — G894 Chronic pain syndrome: Secondary | ICD-10-CM | POA: Diagnosis not present

## 2013-09-14 DIAGNOSIS — K219 Gastro-esophageal reflux disease without esophagitis: Secondary | ICD-10-CM | POA: Diagnosis not present

## 2013-09-14 DIAGNOSIS — G8929 Other chronic pain: Secondary | ICD-10-CM | POA: Diagnosis not present

## 2013-09-14 DIAGNOSIS — M4120 Other idiopathic scoliosis, site unspecified: Secondary | ICD-10-CM | POA: Diagnosis not present

## 2013-09-14 DIAGNOSIS — G4733 Obstructive sleep apnea (adult) (pediatric): Secondary | ICD-10-CM | POA: Diagnosis not present

## 2013-09-14 DIAGNOSIS — M19019 Primary osteoarthritis, unspecified shoulder: Secondary | ICD-10-CM | POA: Diagnosis not present

## 2013-09-14 DIAGNOSIS — M5412 Radiculopathy, cervical region: Secondary | ICD-10-CM | POA: Diagnosis not present

## 2013-09-14 DIAGNOSIS — I10 Essential (primary) hypertension: Secondary | ICD-10-CM | POA: Diagnosis not present

## 2013-09-14 DIAGNOSIS — Z7982 Long term (current) use of aspirin: Secondary | ICD-10-CM | POA: Diagnosis not present

## 2013-09-14 DIAGNOSIS — F329 Major depressive disorder, single episode, unspecified: Secondary | ICD-10-CM | POA: Diagnosis not present

## 2013-09-28 DIAGNOSIS — D485 Neoplasm of uncertain behavior of skin: Secondary | ICD-10-CM | POA: Diagnosis not present

## 2013-09-28 DIAGNOSIS — L819 Disorder of pigmentation, unspecified: Secondary | ICD-10-CM | POA: Diagnosis not present

## 2013-09-28 DIAGNOSIS — D234 Other benign neoplasm of skin of scalp and neck: Secondary | ICD-10-CM | POA: Diagnosis not present

## 2013-09-28 DIAGNOSIS — L570 Actinic keratosis: Secondary | ICD-10-CM | POA: Diagnosis not present

## 2013-09-28 DIAGNOSIS — L821 Other seborrheic keratosis: Secondary | ICD-10-CM | POA: Diagnosis not present

## 2013-11-04 DIAGNOSIS — I10 Essential (primary) hypertension: Secondary | ICD-10-CM | POA: Diagnosis not present

## 2013-11-04 DIAGNOSIS — K219 Gastro-esophageal reflux disease without esophagitis: Secondary | ICD-10-CM | POA: Diagnosis not present

## 2013-11-04 DIAGNOSIS — E785 Hyperlipidemia, unspecified: Secondary | ICD-10-CM | POA: Diagnosis not present

## 2013-11-14 DIAGNOSIS — J040 Acute laryngitis: Secondary | ICD-10-CM | POA: Diagnosis not present

## 2013-11-14 DIAGNOSIS — J0100 Acute maxillary sinusitis, unspecified: Secondary | ICD-10-CM | POA: Diagnosis not present

## 2013-11-18 DIAGNOSIS — L570 Actinic keratosis: Secondary | ICD-10-CM | POA: Diagnosis not present

## 2013-12-08 ENCOUNTER — Ambulatory Visit: Payer: Self-pay | Admitting: Urology

## 2013-12-08 DIAGNOSIS — N952 Postmenopausal atrophic vaginitis: Secondary | ICD-10-CM | POA: Diagnosis not present

## 2013-12-08 DIAGNOSIS — N3289 Other specified disorders of bladder: Secondary | ICD-10-CM | POA: Diagnosis not present

## 2013-12-08 DIAGNOSIS — K5900 Constipation, unspecified: Secondary | ICD-10-CM | POA: Diagnosis not present

## 2014-01-11 DIAGNOSIS — J0100 Acute maxillary sinusitis, unspecified: Secondary | ICD-10-CM | POA: Diagnosis not present

## 2014-01-18 ENCOUNTER — Ambulatory Visit: Payer: Self-pay | Admitting: Pain Medicine

## 2014-01-18 DIAGNOSIS — Z7982 Long term (current) use of aspirin: Secondary | ICD-10-CM | POA: Diagnosis not present

## 2014-01-18 DIAGNOSIS — Z862 Personal history of diseases of the blood and blood-forming organs and certain disorders involving the immune mechanism: Secondary | ICD-10-CM | POA: Diagnosis not present

## 2014-01-18 DIAGNOSIS — G5600 Carpal tunnel syndrome, unspecified upper limb: Secondary | ICD-10-CM | POA: Diagnosis not present

## 2014-01-18 DIAGNOSIS — M542 Cervicalgia: Secondary | ICD-10-CM | POA: Diagnosis not present

## 2014-01-18 DIAGNOSIS — F3289 Other specified depressive episodes: Secondary | ICD-10-CM | POA: Diagnosis not present

## 2014-01-18 DIAGNOSIS — G8929 Other chronic pain: Secondary | ICD-10-CM | POA: Diagnosis not present

## 2014-01-18 DIAGNOSIS — M5030 Other cervical disc degeneration, unspecified cervical region: Secondary | ICD-10-CM | POA: Diagnosis not present

## 2014-01-18 DIAGNOSIS — K589 Irritable bowel syndrome without diarrhea: Secondary | ICD-10-CM | POA: Diagnosis not present

## 2014-01-18 DIAGNOSIS — K279 Peptic ulcer, site unspecified, unspecified as acute or chronic, without hemorrhage or perforation: Secondary | ICD-10-CM | POA: Diagnosis not present

## 2014-01-18 DIAGNOSIS — K219 Gastro-esophageal reflux disease without esophagitis: Secondary | ICD-10-CM | POA: Diagnosis not present

## 2014-01-18 DIAGNOSIS — G894 Chronic pain syndrome: Secondary | ICD-10-CM | POA: Diagnosis not present

## 2014-01-18 DIAGNOSIS — R51 Headache: Secondary | ICD-10-CM | POA: Diagnosis not present

## 2014-01-18 DIAGNOSIS — M5412 Radiculopathy, cervical region: Secondary | ICD-10-CM | POA: Diagnosis not present

## 2014-01-18 DIAGNOSIS — IMO0001 Reserved for inherently not codable concepts without codable children: Secondary | ICD-10-CM | POA: Diagnosis not present

## 2014-01-18 DIAGNOSIS — M4712 Other spondylosis with myelopathy, cervical region: Secondary | ICD-10-CM | POA: Diagnosis not present

## 2014-01-18 DIAGNOSIS — M4120 Other idiopathic scoliosis, site unspecified: Secondary | ICD-10-CM | POA: Diagnosis not present

## 2014-01-18 DIAGNOSIS — I10 Essential (primary) hypertension: Secondary | ICD-10-CM | POA: Diagnosis not present

## 2014-01-18 DIAGNOSIS — F329 Major depressive disorder, single episode, unspecified: Secondary | ICD-10-CM | POA: Diagnosis not present

## 2014-01-18 DIAGNOSIS — M4716 Other spondylosis with myelopathy, lumbar region: Secondary | ICD-10-CM | POA: Diagnosis not present

## 2014-01-18 DIAGNOSIS — Z79899 Other long term (current) drug therapy: Secondary | ICD-10-CM | POA: Diagnosis not present

## 2014-01-18 DIAGNOSIS — M1990 Unspecified osteoarthritis, unspecified site: Secondary | ICD-10-CM | POA: Diagnosis not present

## 2014-01-18 DIAGNOSIS — M5137 Other intervertebral disc degeneration, lumbosacral region: Secondary | ICD-10-CM | POA: Diagnosis not present

## 2014-01-18 DIAGNOSIS — G4733 Obstructive sleep apnea (adult) (pediatric): Secondary | ICD-10-CM | POA: Diagnosis not present

## 2014-01-18 DIAGNOSIS — Z6379 Other stressful life events affecting family and household: Secondary | ICD-10-CM | POA: Diagnosis not present

## 2014-01-18 DIAGNOSIS — M19019 Primary osteoarthritis, unspecified shoulder: Secondary | ICD-10-CM | POA: Diagnosis not present

## 2014-01-31 ENCOUNTER — Ambulatory Visit: Payer: Self-pay | Admitting: Pain Medicine

## 2014-01-31 DIAGNOSIS — M5137 Other intervertebral disc degeneration, lumbosacral region: Secondary | ICD-10-CM | POA: Diagnosis not present

## 2014-01-31 DIAGNOSIS — M48061 Spinal stenosis, lumbar region without neurogenic claudication: Secondary | ICD-10-CM | POA: Diagnosis not present

## 2014-01-31 DIAGNOSIS — M5126 Other intervertebral disc displacement, lumbar region: Secondary | ICD-10-CM | POA: Diagnosis not present

## 2014-02-08 DIAGNOSIS — L678 Other hair color and hair shaft abnormalities: Secondary | ICD-10-CM | POA: Diagnosis not present

## 2014-02-08 DIAGNOSIS — L738 Other specified follicular disorders: Secondary | ICD-10-CM | POA: Diagnosis not present

## 2014-02-08 DIAGNOSIS — L819 Disorder of pigmentation, unspecified: Secondary | ICD-10-CM | POA: Diagnosis not present

## 2014-02-08 DIAGNOSIS — L570 Actinic keratosis: Secondary | ICD-10-CM | POA: Diagnosis not present

## 2014-02-08 DIAGNOSIS — R21 Rash and other nonspecific skin eruption: Secondary | ICD-10-CM | POA: Diagnosis not present

## 2014-02-09 ENCOUNTER — Ambulatory Visit: Payer: Self-pay | Admitting: Pain Medicine

## 2014-02-09 DIAGNOSIS — IMO0002 Reserved for concepts with insufficient information to code with codable children: Secondary | ICD-10-CM | POA: Diagnosis not present

## 2014-02-27 ENCOUNTER — Ambulatory Visit: Payer: Self-pay | Admitting: Family Medicine

## 2014-02-27 DIAGNOSIS — R51 Headache: Secondary | ICD-10-CM | POA: Diagnosis not present

## 2014-02-27 DIAGNOSIS — F430 Acute stress reaction: Secondary | ICD-10-CM | POA: Diagnosis not present

## 2014-02-27 DIAGNOSIS — Z1231 Encounter for screening mammogram for malignant neoplasm of breast: Secondary | ICD-10-CM | POA: Diagnosis not present

## 2014-03-01 ENCOUNTER — Ambulatory Visit: Payer: Self-pay | Admitting: Pain Medicine

## 2014-03-01 DIAGNOSIS — F3289 Other specified depressive episodes: Secondary | ICD-10-CM | POA: Diagnosis not present

## 2014-03-01 DIAGNOSIS — Z862 Personal history of diseases of the blood and blood-forming organs and certain disorders involving the immune mechanism: Secondary | ICD-10-CM | POA: Diagnosis not present

## 2014-03-01 DIAGNOSIS — M5137 Other intervertebral disc degeneration, lumbosacral region: Secondary | ICD-10-CM | POA: Diagnosis not present

## 2014-03-01 DIAGNOSIS — G8929 Other chronic pain: Secondary | ICD-10-CM | POA: Diagnosis not present

## 2014-03-01 DIAGNOSIS — Z5181 Encounter for therapeutic drug level monitoring: Secondary | ICD-10-CM | POA: Diagnosis not present

## 2014-03-01 DIAGNOSIS — M5030 Other cervical disc degeneration, unspecified cervical region: Secondary | ICD-10-CM | POA: Diagnosis not present

## 2014-03-01 DIAGNOSIS — M4716 Other spondylosis with myelopathy, lumbar region: Secondary | ICD-10-CM | POA: Diagnosis not present

## 2014-03-01 DIAGNOSIS — M5412 Radiculopathy, cervical region: Secondary | ICD-10-CM | POA: Diagnosis not present

## 2014-03-01 DIAGNOSIS — M1610 Unilateral primary osteoarthritis, unspecified hip: Secondary | ICD-10-CM | POA: Diagnosis not present

## 2014-03-01 DIAGNOSIS — Z6379 Other stressful life events affecting family and household: Secondary | ICD-10-CM | POA: Diagnosis not present

## 2014-03-01 DIAGNOSIS — M4712 Other spondylosis with myelopathy, cervical region: Secondary | ICD-10-CM | POA: Diagnosis not present

## 2014-03-01 DIAGNOSIS — K219 Gastro-esophageal reflux disease without esophagitis: Secondary | ICD-10-CM | POA: Diagnosis not present

## 2014-03-01 DIAGNOSIS — G894 Chronic pain syndrome: Secondary | ICD-10-CM | POA: Diagnosis not present

## 2014-03-01 DIAGNOSIS — M169 Osteoarthritis of hip, unspecified: Secondary | ICD-10-CM | POA: Diagnosis not present

## 2014-03-01 DIAGNOSIS — M542 Cervicalgia: Secondary | ICD-10-CM | POA: Diagnosis not present

## 2014-03-01 DIAGNOSIS — Z7982 Long term (current) use of aspirin: Secondary | ICD-10-CM | POA: Diagnosis not present

## 2014-03-01 DIAGNOSIS — K279 Peptic ulcer, site unspecified, unspecified as acute or chronic, without hemorrhage or perforation: Secondary | ICD-10-CM | POA: Diagnosis not present

## 2014-03-01 DIAGNOSIS — Z79899 Other long term (current) drug therapy: Secondary | ICD-10-CM | POA: Diagnosis not present

## 2014-03-01 DIAGNOSIS — K589 Irritable bowel syndrome without diarrhea: Secondary | ICD-10-CM | POA: Diagnosis not present

## 2014-03-01 DIAGNOSIS — G4733 Obstructive sleep apnea (adult) (pediatric): Secondary | ICD-10-CM | POA: Diagnosis not present

## 2014-03-01 DIAGNOSIS — F329 Major depressive disorder, single episode, unspecified: Secondary | ICD-10-CM | POA: Diagnosis not present

## 2014-03-01 DIAGNOSIS — IMO0001 Reserved for inherently not codable concepts without codable children: Secondary | ICD-10-CM | POA: Diagnosis not present

## 2014-03-01 DIAGNOSIS — M4120 Other idiopathic scoliosis, site unspecified: Secondary | ICD-10-CM | POA: Diagnosis not present

## 2014-03-02 ENCOUNTER — Ambulatory Visit: Payer: Self-pay | Admitting: Pain Medicine

## 2014-03-02 DIAGNOSIS — M5137 Other intervertebral disc degeneration, lumbosacral region: Secondary | ICD-10-CM | POA: Diagnosis not present

## 2014-03-02 DIAGNOSIS — M47817 Spondylosis without myelopathy or radiculopathy, lumbosacral region: Secondary | ICD-10-CM | POA: Diagnosis not present

## 2014-03-02 DIAGNOSIS — G894 Chronic pain syndrome: Secondary | ICD-10-CM | POA: Diagnosis not present

## 2014-03-02 LAB — BASIC METABOLIC PANEL
Anion Gap: 3 — ABNORMAL LOW (ref 7–16)
BUN: 19 mg/dL — AB (ref 7–18)
CALCIUM: 9 mg/dL (ref 8.5–10.1)
CHLORIDE: 104 mmol/L (ref 98–107)
CO2: 30 mmol/L (ref 21–32)
Creatinine: 0.72 mg/dL (ref 0.60–1.30)
EGFR (African American): >60
EGFR (Non-African Amer.): >60
GLUCOSE: 97 mg/dL (ref 65–99)
OSMOLALITY: 276 (ref 275–301)
Potassium: 4.1 mmol/L (ref 3.5–5.1)
Sodium: 137 mmol/L (ref 136–145)

## 2014-03-02 LAB — HEPATIC FUNCTION PANEL A (ARMC)
ALBUMIN: 3.4 g/dL (ref 3.4–5.0)
ALK PHOS: 80 U/L
BILIRUBIN TOTAL: 0.3 mg/dL (ref 0.2–1.0)
SGOT(AST): 29 U/L (ref 15–37)
SGPT (ALT): 32 U/L (ref 12–78)
Total Protein: 7 g/dL (ref 6.4–8.2)

## 2014-03-02 LAB — MAGNESIUM: Magnesium: 1.9 mg/dL

## 2014-03-06 DIAGNOSIS — I10 Essential (primary) hypertension: Secondary | ICD-10-CM | POA: Diagnosis not present

## 2014-03-06 DIAGNOSIS — E785 Hyperlipidemia, unspecified: Secondary | ICD-10-CM | POA: Diagnosis not present

## 2014-03-06 DIAGNOSIS — K219 Gastro-esophageal reflux disease without esophagitis: Secondary | ICD-10-CM | POA: Diagnosis not present

## 2014-03-24 DIAGNOSIS — Z6832 Body mass index (BMI) 32.0-32.9, adult: Secondary | ICD-10-CM | POA: Diagnosis not present

## 2014-03-24 DIAGNOSIS — M549 Dorsalgia, unspecified: Secondary | ICD-10-CM | POA: Diagnosis not present

## 2014-05-26 ENCOUNTER — Ambulatory Visit: Payer: Self-pay | Admitting: Pain Medicine

## 2014-05-26 DIAGNOSIS — Z7982 Long term (current) use of aspirin: Secondary | ICD-10-CM | POA: Diagnosis not present

## 2014-05-26 DIAGNOSIS — G4733 Obstructive sleep apnea (adult) (pediatric): Secondary | ICD-10-CM | POA: Diagnosis not present

## 2014-05-26 DIAGNOSIS — K219 Gastro-esophageal reflux disease without esophagitis: Secondary | ICD-10-CM | POA: Diagnosis not present

## 2014-05-26 DIAGNOSIS — Z6379 Other stressful life events affecting family and household: Secondary | ICD-10-CM | POA: Diagnosis not present

## 2014-05-26 DIAGNOSIS — K279 Peptic ulcer, site unspecified, unspecified as acute or chronic, without hemorrhage or perforation: Secondary | ICD-10-CM | POA: Diagnosis not present

## 2014-05-26 DIAGNOSIS — M542 Cervicalgia: Secondary | ICD-10-CM | POA: Diagnosis not present

## 2014-05-26 DIAGNOSIS — M4716 Other spondylosis with myelopathy, lumbar region: Secondary | ICD-10-CM | POA: Diagnosis not present

## 2014-05-26 DIAGNOSIS — M5412 Radiculopathy, cervical region: Secondary | ICD-10-CM | POA: Diagnosis not present

## 2014-05-26 DIAGNOSIS — M5137 Other intervertebral disc degeneration, lumbosacral region: Secondary | ICD-10-CM | POA: Diagnosis not present

## 2014-05-26 DIAGNOSIS — F3289 Other specified depressive episodes: Secondary | ICD-10-CM | POA: Diagnosis not present

## 2014-05-26 DIAGNOSIS — G894 Chronic pain syndrome: Secondary | ICD-10-CM | POA: Diagnosis not present

## 2014-05-26 DIAGNOSIS — M4120 Other idiopathic scoliosis, site unspecified: Secondary | ICD-10-CM | POA: Diagnosis not present

## 2014-05-26 DIAGNOSIS — K589 Irritable bowel syndrome without diarrhea: Secondary | ICD-10-CM | POA: Diagnosis not present

## 2014-05-26 DIAGNOSIS — Z79899 Other long term (current) drug therapy: Secondary | ICD-10-CM | POA: Diagnosis not present

## 2014-05-26 DIAGNOSIS — M4712 Other spondylosis with myelopathy, cervical region: Secondary | ICD-10-CM | POA: Diagnosis not present

## 2014-05-26 DIAGNOSIS — G5600 Carpal tunnel syndrome, unspecified upper limb: Secondary | ICD-10-CM | POA: Diagnosis not present

## 2014-05-26 DIAGNOSIS — M5030 Other cervical disc degeneration, unspecified cervical region: Secondary | ICD-10-CM | POA: Diagnosis not present

## 2014-05-26 DIAGNOSIS — I10 Essential (primary) hypertension: Secondary | ICD-10-CM | POA: Diagnosis not present

## 2014-05-26 DIAGNOSIS — Z862 Personal history of diseases of the blood and blood-forming organs and certain disorders involving the immune mechanism: Secondary | ICD-10-CM | POA: Diagnosis not present

## 2014-05-26 DIAGNOSIS — F329 Major depressive disorder, single episode, unspecified: Secondary | ICD-10-CM | POA: Diagnosis not present

## 2014-05-26 DIAGNOSIS — G8929 Other chronic pain: Secondary | ICD-10-CM | POA: Diagnosis not present

## 2014-05-26 DIAGNOSIS — M19019 Primary osteoarthritis, unspecified shoulder: Secondary | ICD-10-CM | POA: Diagnosis not present

## 2014-05-31 DIAGNOSIS — F411 Generalized anxiety disorder: Secondary | ICD-10-CM | POA: Diagnosis not present

## 2014-06-07 DIAGNOSIS — F411 Generalized anxiety disorder: Secondary | ICD-10-CM | POA: Diagnosis not present

## 2014-06-09 DIAGNOSIS — H2510 Age-related nuclear cataract, unspecified eye: Secondary | ICD-10-CM | POA: Diagnosis not present

## 2014-06-09 DIAGNOSIS — Z961 Presence of intraocular lens: Secondary | ICD-10-CM | POA: Diagnosis not present

## 2014-06-15 DIAGNOSIS — F411 Generalized anxiety disorder: Secondary | ICD-10-CM | POA: Diagnosis not present

## 2014-06-21 ENCOUNTER — Emergency Department: Payer: Self-pay | Admitting: Emergency Medicine

## 2014-06-21 DIAGNOSIS — Z880 Allergy status to penicillin: Secondary | ICD-10-CM | POA: Diagnosis not present

## 2014-06-21 DIAGNOSIS — F411 Generalized anxiety disorder: Secondary | ICD-10-CM | POA: Diagnosis not present

## 2014-06-21 DIAGNOSIS — F3289 Other specified depressive episodes: Secondary | ICD-10-CM | POA: Diagnosis not present

## 2014-06-21 DIAGNOSIS — I10 Essential (primary) hypertension: Secondary | ICD-10-CM | POA: Diagnosis not present

## 2014-06-21 DIAGNOSIS — R42 Dizziness and giddiness: Secondary | ICD-10-CM | POA: Diagnosis not present

## 2014-06-21 DIAGNOSIS — F329 Major depressive disorder, single episode, unspecified: Secondary | ICD-10-CM | POA: Diagnosis not present

## 2014-06-21 DIAGNOSIS — R51 Headache: Secondary | ICD-10-CM | POA: Diagnosis not present

## 2014-06-21 LAB — CBC
HCT: 36.7 % (ref 35.0–47.0)
HGB: 12.7 g/dL (ref 12.0–16.0)
MCH: 30.6 pg (ref 26.0–34.0)
MCHC: 34.7 g/dL (ref 32.0–36.0)
MCV: 88 fL (ref 80–100)
Platelet: 205 10*3/uL (ref 150–440)
RBC: 4.16 10*6/uL (ref 3.80–5.20)
RDW: 13.3 % (ref 11.5–14.5)
WBC: 6.5 10*3/uL (ref 3.6–11.0)

## 2014-06-21 LAB — TROPONIN I: Troponin-I: <0.02 ng/mL

## 2014-06-21 LAB — COMPREHENSIVE METABOLIC PANEL
ALBUMIN: 3.5 g/dL (ref 3.4–5.0)
ALK PHOS: 69 U/L
ANION GAP: 8 (ref 7–16)
BUN: 19 mg/dL — ABNORMAL HIGH (ref 7–18)
Bilirubin,Total: 0.4 mg/dL (ref 0.2–1.0)
CHLORIDE: 107 mmol/L (ref 98–107)
CREATININE: 0.83 mg/dL (ref 0.60–1.30)
Calcium, Total: 8.8 mg/dL (ref 8.5–10.1)
Co2: 25 mmol/L (ref 21–32)
EGFR (Non-African Amer.): >60
GLUCOSE: 142 mg/dL — AB (ref 65–99)
OSMOLALITY: 284 (ref 275–301)
POTASSIUM: 3.7 mmol/L (ref 3.5–5.1)
SGOT(AST): 23 U/L (ref 15–37)
SGPT (ALT): 33 U/L
Sodium: 140 mmol/L (ref 136–145)
Total Protein: 7.1 g/dL (ref 6.4–8.2)

## 2014-06-27 DIAGNOSIS — J309 Allergic rhinitis, unspecified: Secondary | ICD-10-CM | POA: Diagnosis not present

## 2014-06-27 DIAGNOSIS — R350 Frequency of micturition: Secondary | ICD-10-CM | POA: Diagnosis not present

## 2014-06-27 DIAGNOSIS — I700 Atherosclerosis of aorta: Secondary | ICD-10-CM | POA: Diagnosis not present

## 2014-06-27 DIAGNOSIS — R42 Dizziness and giddiness: Secondary | ICD-10-CM | POA: Diagnosis not present

## 2014-06-29 DIAGNOSIS — F411 Generalized anxiety disorder: Secondary | ICD-10-CM | POA: Diagnosis not present

## 2014-07-06 ENCOUNTER — Encounter: Payer: Self-pay | Admitting: *Deleted

## 2014-07-13 ENCOUNTER — Encounter (INDEPENDENT_AMBULATORY_CARE_PROVIDER_SITE_OTHER): Payer: Self-pay

## 2014-07-13 ENCOUNTER — Ambulatory Visit (INDEPENDENT_AMBULATORY_CARE_PROVIDER_SITE_OTHER): Payer: Medicare Other | Admitting: Cardiovascular Disease

## 2014-07-13 ENCOUNTER — Encounter: Payer: Self-pay | Admitting: Cardiovascular Disease

## 2014-07-13 VITALS — BP 121/71 | HR 76 | Ht 60.0 in | Wt 166.5 lb

## 2014-07-13 DIAGNOSIS — M79609 Pain in unspecified limb: Secondary | ICD-10-CM | POA: Diagnosis not present

## 2014-07-13 DIAGNOSIS — I700 Atherosclerosis of aorta: Secondary | ICD-10-CM | POA: Diagnosis not present

## 2014-07-13 DIAGNOSIS — I739 Peripheral vascular disease, unspecified: Secondary | ICD-10-CM

## 2014-07-13 DIAGNOSIS — M79606 Pain in leg, unspecified: Secondary | ICD-10-CM

## 2014-07-13 DIAGNOSIS — R0602 Shortness of breath: Secondary | ICD-10-CM | POA: Diagnosis not present

## 2014-07-13 DIAGNOSIS — M79604 Pain in right leg: Secondary | ICD-10-CM | POA: Insufficient documentation

## 2014-07-13 NOTE — Assessment & Plan Note (Signed)
She was found to have visible aortic calcifications on plain x-ray. Thus, it is important to ensure that she does not have abdominal aortic aneurysm. I requested duplex for evaluation.

## 2014-07-13 NOTE — Patient Instructions (Signed)
Your physician has requested that you have a lower or upper extremity arterial duplex. This test is an ultrasound of the arteries in the legs or arms. It looks at arterial blood flow in the legs and arms. Allow one hour for Lower and Upper Arterial scans. There are no restrictions or special instructions  Your physician has requested that you have an abdominal aorta duplex. During this test, an ultrasound is used to evaluate the aorta. Allow 30 minutes for this exam. Do not eat after midnight the day before and avoid carbonated beverages   Your physician recommends that you schedule a follow-up appointment in:  As needed with Dr. Fletcher Anon

## 2014-07-13 NOTE — Assessment & Plan Note (Signed)
She describes cramping in both legs worse on the right side. Pulses are relatively normal and his symptoms are overall atypical for claudication. I requested lower extremity arterial Doppler. If she has no aneurysm and no evidence of obstructive disease, no further workup is recommended. She is already on good medical therapy for her risk factors. She has no symptoms suggestive of angina.

## 2014-07-13 NOTE — Progress Notes (Signed)
Primary care physician: Dr. Jacqualine Code  HPI  This is a pleasant 80 year old female who was referred for evaluation of an abnormal x-ray which showed calcifications of the abdominal aorta. She has no previous cardiac history and no previous history of aortic aneurysm or peripheral arterial disease. She has chronic medical conditions that include hyperlipidemia, hypertension and GERD. She reports having cardiac catheterization in 2013 which was unremarkable. She has strong family history of coronary artery disease but not aortic aneurysm. She denies any chest discomfort although she endorses chronic exertional dyspnea with no recent worsening. She reports chronic bilateral leg cramps both at rest and with activities. Sometimes it forces her to stop after a few blocks. She has significant knee pain.  Allergies  Allergen Reactions  . Amoxicillin   . Clindamycin/Lincomycin   . Doxycycline   . Levaquin [Levofloxacin In D5w]   . Phenergan [Promethazine Hcl]   . Singulair [Montelukast]   . Vicodin [Hydrocodone-Acetaminophen]   . Zocor [Simvastatin]      Current Outpatient Prescriptions on File Prior to Visit  Medication Sig Dispense Refill  . acetaminophen (TYLENOL) 500 MG tablet Take 500 mg by mouth 3 (three) times daily as needed.      Marland Kitchen aspirin 81 MG tablet Take 81 mg by mouth daily.      Marland Kitchen atorvastatin (LIPITOR) 20 MG tablet Take 20 mg by mouth daily.      . Calcium Carbonate-Vitamin D 600-200 MG-UNIT CAPS Take by mouth 2 (two) times daily.      . cholecalciferol (VITAMIN D) 1000 UNITS tablet Take 1,000 Units by mouth daily.      Marland Kitchen docusate sodium (COLACE) 100 MG capsule Take 100 mg by mouth as needed for mild constipation.      . DULoxetine (CYMBALTA) 60 MG capsule Take 60 mg by mouth daily.      . fluticasone (FLONASE) 50 MCG/ACT nasal spray Place 2 sprays into both nostrils daily.      Marland Kitchen gabapentin (NEURONTIN) 300 MG capsule Take 300 mg by mouth at bedtime.      Marland Kitchen  lisinopril-hydrochlorothiazide (PRINZIDE,ZESTORETIC) 20-25 MG per tablet Take 1 tablet by mouth daily.      . Magnesium Gluconate 250 MG TABS Take by mouth daily.      . Omega-3 Fatty Acids (FISH OIL) 1200 MG CAPS Take by mouth daily.      Marland Kitchen omeprazole (PRILOSEC) 20 MG capsule Take 20 mg by mouth daily.      . phenazopyridine (PYRIDIUM) 200 MG tablet Take 200 mg by mouth as needed for pain.      Marland Kitchen Potassium Gluconate 595 MG CAPS Take by mouth daily.      . ranitidine (ZANTAC) 150 MG tablet Take 150 mg by mouth as needed for heartburn.      . traMADol (ULTRAM) 50 MG tablet Take 50 mg by mouth 2 (two) times daily as needed.       No current facility-administered medications on file prior to visit.     Past Medical History  Diagnosis Date  . Allergic rhinitis, cause unspecified   . Urinary frequency   . Dizziness and giddiness   . Atherosclerosis of aorta   . Unspecified essential hypertension   . Other and unspecified hyperlipidemia   . Degeneration of lumbar or lumbosacral intervertebral disc   . Unspecified sleep apnea   . Allergic rhinitis, cause unspecified   . Headache(784.0)   . Dysthymic disorder   . Myalgia and myositis, unspecified   . Osteoporosis, unspecified   .  Trigger finger (acquired)   . Other specified disorders of bladder   . Diverticulitis of colon (without mention of hemorrhage)   . Other diseases of nasal cavity and sinuses(478.19)      Past Surgical History  Procedure Laterality Date  . Carpal tunnel release    . Tonsillectomy    . Abdominal hysterectomy    . Temporal artery biopsy / ligation    . Adenoidectomy    . R/o lymph nodes; right axilla    . Arthroscopic knee surgery    . Cardiac catheterization      [facility]     Family History  Problem Relation Age of Onset  . Heart disease Mother   . Heart disease Father   . Heart disease Brother      History   Social History  . Marital Status: Married    Spouse Name: N/A    Number of Children:  N/A  . Years of Education: N/A   Occupational History  . Not on file.   Social History Main Topics  . Smoking status: Never Smoker   . Smokeless tobacco: Not on file  . Alcohol Use: No  . Drug Use: No  . Sexual Activity: Not on file   Other Topics Concern  . Not on file   Social History Narrative  . No narrative on file     ROS A 10 point review of system was performed. It is negative other than that mentioned in the history of present illness.   PHYSICAL EXAM   BP 121/71  Pulse 76  Ht 5' (1.524 m)  Wt 166 lb 8 oz (75.524 kg)  BMI 32.52 kg/m2 Constitutional: She is oriented to person, place, and time. She appears well-developed and well-nourished. No distress.  HENT: No nasal discharge.  Head: Normocephalic and atraumatic.  Eyes: Pupils are equal and round. No discharge.  Neck: Normal range of motion. Neck supple. No JVD present. No thyromegaly present.  Cardiovascular: Normal rate, regular rhythm, normal heart sounds. Exam reveals no gallop and no friction rub. No murmur heard.  Pulmonary/Chest: Effort normal and breath sounds normal. No stridor. No respiratory distress. She has no wheezes. She has no rales. She exhibits no tenderness.  Abdominal: Soft. Bowel sounds are normal. She exhibits no distension. There is no tenderness. There is no rebound and no guarding.  Musculoskeletal: Normal range of motion. She exhibits no edema and no tenderness.  Neurological: She is alert and oriented to person, place, and time. Coordination normal.  Skin: Skin is warm and dry. No rash noted. She is not diaphoretic. No erythema. No pallor.  Psychiatric: She has a normal mood and affect. Her behavior is normal. Judgment and thought content normal.  Vascular: Femoral pulses are normal bilaterally. Dorsalis pedis: Palpable on both sides. Posterior tibial is nonpalpable.   [id number]  Rhythm  WITHIN NORMAL LIMITS   ASSESSMENT AND PLAN

## 2014-07-31 DIAGNOSIS — F411 Generalized anxiety disorder: Secondary | ICD-10-CM | POA: Diagnosis not present

## 2014-08-01 DIAGNOSIS — K219 Gastro-esophageal reflux disease without esophagitis: Secondary | ICD-10-CM | POA: Diagnosis not present

## 2014-08-01 DIAGNOSIS — E785 Hyperlipidemia, unspecified: Secondary | ICD-10-CM | POA: Diagnosis not present

## 2014-08-01 DIAGNOSIS — Z23 Encounter for immunization: Secondary | ICD-10-CM | POA: Diagnosis not present

## 2014-08-01 DIAGNOSIS — I10 Essential (primary) hypertension: Secondary | ICD-10-CM | POA: Diagnosis not present

## 2014-08-10 ENCOUNTER — Encounter (INDEPENDENT_AMBULATORY_CARE_PROVIDER_SITE_OTHER): Payer: Medicare Other

## 2014-08-10 DIAGNOSIS — I70219 Atherosclerosis of native arteries of extremities with intermittent claudication, unspecified extremity: Secondary | ICD-10-CM | POA: Diagnosis not present

## 2014-08-10 DIAGNOSIS — I700 Atherosclerosis of aorta: Secondary | ICD-10-CM | POA: Diagnosis not present

## 2014-08-10 DIAGNOSIS — I70229 Atherosclerosis of native arteries of extremities with rest pain, unspecified extremity: Secondary | ICD-10-CM | POA: Diagnosis not present

## 2014-08-10 DIAGNOSIS — I739 Peripheral vascular disease, unspecified: Secondary | ICD-10-CM

## 2014-08-18 ENCOUNTER — Ambulatory Visit: Payer: Self-pay | Admitting: Pain Medicine

## 2014-08-18 DIAGNOSIS — M542 Cervicalgia: Secondary | ICD-10-CM | POA: Diagnosis not present

## 2014-08-18 DIAGNOSIS — Z79899 Other long term (current) drug therapy: Secondary | ICD-10-CM | POA: Diagnosis not present

## 2014-08-18 DIAGNOSIS — G894 Chronic pain syndrome: Secondary | ICD-10-CM | POA: Diagnosis not present

## 2014-08-18 DIAGNOSIS — M5137 Other intervertebral disc degeneration, lumbosacral region: Secondary | ICD-10-CM | POA: Diagnosis not present

## 2014-08-18 DIAGNOSIS — M5412 Radiculopathy, cervical region: Secondary | ICD-10-CM | POA: Diagnosis not present

## 2014-08-29 DIAGNOSIS — R301 Vesical tenesmus: Secondary | ICD-10-CM | POA: Diagnosis not present

## 2014-08-29 DIAGNOSIS — R350 Frequency of micturition: Secondary | ICD-10-CM | POA: Diagnosis not present

## 2014-10-18 DIAGNOSIS — M5481 Occipital neuralgia: Secondary | ICD-10-CM | POA: Diagnosis not present

## 2014-11-03 DIAGNOSIS — M5481 Occipital neuralgia: Secondary | ICD-10-CM | POA: Diagnosis not present

## 2014-12-14 DIAGNOSIS — M545 Low back pain: Secondary | ICD-10-CM | POA: Diagnosis not present

## 2014-12-14 DIAGNOSIS — Z79899 Other long term (current) drug therapy: Secondary | ICD-10-CM | POA: Diagnosis not present

## 2014-12-19 DIAGNOSIS — E785 Hyperlipidemia, unspecified: Secondary | ICD-10-CM | POA: Diagnosis not present

## 2014-12-19 DIAGNOSIS — I10 Essential (primary) hypertension: Secondary | ICD-10-CM | POA: Diagnosis not present

## 2014-12-19 DIAGNOSIS — K219 Gastro-esophageal reflux disease without esophagitis: Secondary | ICD-10-CM | POA: Diagnosis not present

## 2015-01-08 DIAGNOSIS — H65111 Acute and subacute allergic otitis media (mucoid) (sanguinous) (serous), right ear: Secondary | ICD-10-CM | POA: Diagnosis not present

## 2015-01-08 DIAGNOSIS — R42 Dizziness and giddiness: Secondary | ICD-10-CM | POA: Diagnosis not present

## 2015-01-08 DIAGNOSIS — H9201 Otalgia, right ear: Secondary | ICD-10-CM | POA: Diagnosis not present

## 2015-01-08 DIAGNOSIS — J3489 Other specified disorders of nose and nasal sinuses: Secondary | ICD-10-CM | POA: Diagnosis not present

## 2015-01-08 DIAGNOSIS — H6981 Other specified disorders of Eustachian tube, right ear: Secondary | ICD-10-CM | POA: Diagnosis not present

## 2015-01-08 DIAGNOSIS — J019 Acute sinusitis, unspecified: Secondary | ICD-10-CM | POA: Diagnosis not present

## 2015-01-08 DIAGNOSIS — R51 Headache: Secondary | ICD-10-CM | POA: Diagnosis not present

## 2015-01-30 DIAGNOSIS — R42 Dizziness and giddiness: Secondary | ICD-10-CM | POA: Diagnosis not present

## 2015-01-30 DIAGNOSIS — H906 Mixed conductive and sensorineural hearing loss, bilateral: Secondary | ICD-10-CM | POA: Diagnosis not present

## 2015-02-02 DIAGNOSIS — R42 Dizziness and giddiness: Secondary | ICD-10-CM | POA: Diagnosis not present

## 2015-02-05 DIAGNOSIS — F4542 Pain disorder with related psychological factors: Secondary | ICD-10-CM | POA: Diagnosis not present

## 2015-02-09 ENCOUNTER — Encounter
Admit: 2015-02-09 | Disposition: A | Discharge status: Home / Self Care | Payer: Self-pay | Attending: Otolaryngology | Admitting: Otolaryngology

## 2015-02-09 DIAGNOSIS — R262 Difficulty in walking, not elsewhere classified: Secondary | ICD-10-CM | POA: Diagnosis not present

## 2015-02-09 DIAGNOSIS — R42 Dizziness and giddiness: Secondary | ICD-10-CM | POA: Diagnosis not present

## 2015-02-16 ENCOUNTER — Encounter
Admit: 2015-02-16 | Disposition: A | Discharge status: Home / Self Care | Payer: Self-pay | Attending: Otolaryngology | Admitting: Otolaryngology

## 2015-02-16 DIAGNOSIS — R42 Dizziness and giddiness: Secondary | ICD-10-CM | POA: Diagnosis not present

## 2015-02-16 DIAGNOSIS — R262 Difficulty in walking, not elsewhere classified: Secondary | ICD-10-CM | POA: Diagnosis not present

## 2015-02-23 DIAGNOSIS — R262 Difficulty in walking, not elsewhere classified: Secondary | ICD-10-CM | POA: Diagnosis not present

## 2015-02-23 DIAGNOSIS — R42 Dizziness and giddiness: Secondary | ICD-10-CM | POA: Diagnosis not present

## 2015-03-01 DIAGNOSIS — M4800 Spinal stenosis, site unspecified: Secondary | ICD-10-CM | POA: Diagnosis not present

## 2015-03-01 DIAGNOSIS — G4733 Obstructive sleep apnea (adult) (pediatric): Secondary | ICD-10-CM | POA: Diagnosis not present

## 2015-03-01 DIAGNOSIS — R413 Other amnesia: Secondary | ICD-10-CM | POA: Diagnosis not present

## 2015-03-01 DIAGNOSIS — M5481 Occipital neuralgia: Secondary | ICD-10-CM | POA: Diagnosis not present

## 2015-03-02 DIAGNOSIS — R42 Dizziness and giddiness: Secondary | ICD-10-CM | POA: Diagnosis not present

## 2015-03-02 DIAGNOSIS — R262 Difficulty in walking, not elsewhere classified: Secondary | ICD-10-CM | POA: Diagnosis not present

## 2015-03-07 ENCOUNTER — Ambulatory Visit
Admit: 2015-03-07 | Disposition: A | Discharge status: Home / Self Care | Payer: Self-pay | Attending: Family Medicine | Admitting: Family Medicine

## 2015-03-07 DIAGNOSIS — M8589 Other specified disorders of bone density and structure, multiple sites: Secondary | ICD-10-CM | POA: Diagnosis not present

## 2015-03-07 DIAGNOSIS — Z13820 Encounter for screening for osteoporosis: Secondary | ICD-10-CM | POA: Diagnosis not present

## 2015-03-07 DIAGNOSIS — M8580 Other specified disorders of bone density and structure, unspecified site: Secondary | ICD-10-CM | POA: Diagnosis not present

## 2015-03-07 DIAGNOSIS — Z1231 Encounter for screening mammogram for malignant neoplasm of breast: Secondary | ICD-10-CM | POA: Diagnosis not present

## 2015-03-07 LAB — HM MAMMOGRAPHY

## 2015-03-07 LAB — HM DEXA SCAN

## 2015-03-15 DIAGNOSIS — M4800 Spinal stenosis, site unspecified: Secondary | ICD-10-CM | POA: Diagnosis not present

## 2015-03-15 DIAGNOSIS — E859 Amyloidosis, unspecified: Secondary | ICD-10-CM | POA: Diagnosis not present

## 2015-03-15 DIAGNOSIS — M810 Age-related osteoporosis without current pathological fracture: Secondary | ICD-10-CM | POA: Diagnosis not present

## 2015-03-15 DIAGNOSIS — E559 Vitamin D deficiency, unspecified: Secondary | ICD-10-CM | POA: Diagnosis not present

## 2015-03-23 ENCOUNTER — Encounter: Payer: Private Health Insurance - Indemnity | Admitting: Physical Therapy

## 2015-03-28 DIAGNOSIS — D649 Anemia, unspecified: Secondary | ICD-10-CM | POA: Insufficient documentation

## 2015-03-28 DIAGNOSIS — F419 Anxiety disorder, unspecified: Secondary | ICD-10-CM

## 2015-03-28 DIAGNOSIS — M1990 Unspecified osteoarthritis, unspecified site: Secondary | ICD-10-CM | POA: Insufficient documentation

## 2015-03-28 DIAGNOSIS — K219 Gastro-esophageal reflux disease without esophagitis: Secondary | ICD-10-CM | POA: Insufficient documentation

## 2015-03-28 DIAGNOSIS — M8580 Other specified disorders of bone density and structure, unspecified site: Secondary | ICD-10-CM | POA: Insufficient documentation

## 2015-03-28 DIAGNOSIS — I6350 Cerebral infarction due to unspecified occlusion or stenosis of unspecified cerebral artery: Secondary | ICD-10-CM | POA: Insufficient documentation

## 2015-03-28 DIAGNOSIS — M542 Cervicalgia: Secondary | ICD-10-CM | POA: Insufficient documentation

## 2015-03-28 DIAGNOSIS — K598 Other specified functional intestinal disorders: Secondary | ICD-10-CM

## 2015-03-28 DIAGNOSIS — G8929 Other chronic pain: Secondary | ICD-10-CM | POA: Insufficient documentation

## 2015-03-28 DIAGNOSIS — K5730 Diverticulosis of large intestine without perforation or abscess without bleeding: Secondary | ICD-10-CM | POA: Insufficient documentation

## 2015-03-28 DIAGNOSIS — F329 Major depressive disorder, single episode, unspecified: Secondary | ICD-10-CM | POA: Insufficient documentation

## 2015-03-28 DIAGNOSIS — M810 Age-related osteoporosis without current pathological fracture: Secondary | ICD-10-CM | POA: Insufficient documentation

## 2015-03-28 DIAGNOSIS — F324 Major depressive disorder, single episode, in partial remission: Secondary | ICD-10-CM | POA: Insufficient documentation

## 2015-03-28 DIAGNOSIS — E7849 Other hyperlipidemia: Secondary | ICD-10-CM | POA: Insufficient documentation

## 2015-03-28 DIAGNOSIS — M5136 Other intervertebral disc degeneration, lumbar region: Secondary | ICD-10-CM | POA: Insufficient documentation

## 2015-03-28 DIAGNOSIS — M51369 Other intervertebral disc degeneration, lumbar region without mention of lumbar back pain or lower extremity pain: Secondary | ICD-10-CM | POA: Insufficient documentation

## 2015-03-28 DIAGNOSIS — Z86010 Personal history of colonic polyps: Secondary | ICD-10-CM | POA: Insufficient documentation

## 2015-03-28 DIAGNOSIS — I10 Essential (primary) hypertension: Secondary | ICD-10-CM | POA: Insufficient documentation

## 2015-03-28 DIAGNOSIS — J3489 Other specified disorders of nose and nasal sinuses: Secondary | ICD-10-CM | POA: Insufficient documentation

## 2015-03-28 DIAGNOSIS — F32A Depression, unspecified: Secondary | ICD-10-CM | POA: Insufficient documentation

## 2015-03-28 DIAGNOSIS — R9389 Abnormal findings on diagnostic imaging of other specified body structures: Secondary | ICD-10-CM | POA: Insufficient documentation

## 2015-03-28 DIAGNOSIS — M9979 Connective tissue and disc stenosis of intervertebral foramina of abdomen and other regions: Secondary | ICD-10-CM | POA: Insufficient documentation

## 2015-03-28 DIAGNOSIS — Z8739 Personal history of other diseases of the musculoskeletal system and connective tissue: Secondary | ICD-10-CM | POA: Insufficient documentation

## 2015-03-28 DIAGNOSIS — M6530 Trigger finger, unspecified finger: Secondary | ICD-10-CM | POA: Insufficient documentation

## 2015-03-28 DIAGNOSIS — G4730 Sleep apnea, unspecified: Secondary | ICD-10-CM | POA: Insufficient documentation

## 2015-03-28 DIAGNOSIS — E559 Vitamin D deficiency, unspecified: Secondary | ICD-10-CM | POA: Insufficient documentation

## 2015-03-28 DIAGNOSIS — K589 Irritable bowel syndrome without diarrhea: Secondary | ICD-10-CM | POA: Insufficient documentation

## 2015-03-28 DIAGNOSIS — J329 Chronic sinusitis, unspecified: Secondary | ICD-10-CM | POA: Insufficient documentation

## 2015-03-28 DIAGNOSIS — J309 Allergic rhinitis, unspecified: Secondary | ICD-10-CM | POA: Insufficient documentation

## 2015-03-28 DIAGNOSIS — K259 Gastric ulcer, unspecified as acute or chronic, without hemorrhage or perforation: Secondary | ICD-10-CM | POA: Insufficient documentation

## 2015-03-28 DIAGNOSIS — R519 Headache, unspecified: Secondary | ICD-10-CM | POA: Insufficient documentation

## 2015-03-28 DIAGNOSIS — M4800 Spinal stenosis, site unspecified: Secondary | ICD-10-CM | POA: Insufficient documentation

## 2015-03-28 DIAGNOSIS — R51 Headache: Secondary | ICD-10-CM

## 2015-03-28 DIAGNOSIS — K5989 Other specified functional intestinal disorders: Secondary | ICD-10-CM | POA: Insufficient documentation

## 2015-05-09 ENCOUNTER — Ambulatory Visit (INDEPENDENT_AMBULATORY_CARE_PROVIDER_SITE_OTHER): Payer: Medicare Other | Admitting: Family Medicine

## 2015-05-09 ENCOUNTER — Telehealth: Payer: Self-pay | Admitting: Family Medicine

## 2015-05-09 ENCOUNTER — Encounter: Payer: Self-pay | Admitting: Family Medicine

## 2015-05-09 VITALS — BP 114/68 | HR 84 | Temp 98.4°F | Resp 16 | Wt 176.0 lb

## 2015-05-09 DIAGNOSIS — G2581 Restless legs syndrome: Secondary | ICD-10-CM | POA: Insufficient documentation

## 2015-05-09 DIAGNOSIS — N3000 Acute cystitis without hematuria: Secondary | ICD-10-CM | POA: Diagnosis not present

## 2015-05-09 DIAGNOSIS — D126 Benign neoplasm of colon, unspecified: Secondary | ICD-10-CM | POA: Insufficient documentation

## 2015-05-09 DIAGNOSIS — E7849 Other hyperlipidemia: Secondary | ICD-10-CM | POA: Insufficient documentation

## 2015-05-09 DIAGNOSIS — E782 Mixed hyperlipidemia: Secondary | ICD-10-CM | POA: Insufficient documentation

## 2015-05-09 MED ORDER — CIPROFLOXACIN HCL 500 MG PO TABS
500.0000 mg | ORAL_TABLET | Freq: Two times a day (BID) | ORAL | Status: DC
Start: 2015-05-09 — End: 2015-05-30

## 2015-05-09 NOTE — Telephone Encounter (Signed)
Pt would like to speak with a nurse about her bladder spasms. Thanks TNP

## 2015-05-09 NOTE — Progress Notes (Addendum)
Todays Provider: Lelon Huh, MD Todays Date: 05/09/2015   Patient ID: Helen Ramsey, female    DOB: [birth year], 81 y.o.   MRN: [id number]   Subjective:     Urinary Tract Infection  This is a recurrent problem. The current episode started in the past 7 days. The problem occurs every urination. The quality of the pain is described as burning. There has been no fever. Associated symptoms include urgency. Pertinent negatives include no chills, discharge, frequency, hematuria, hesitancy, nausea or vomiting. Her past medical history is significant for recurrent UTIs.    Patient Active Problem List   Diagnosis Date Noted  . Adenomatous colon polyp 05/09/2015  . HLD (hyperlipidemia) 05/09/2015  . Restless leg 05/09/2015  . Abnormal magnetic resonance imaging study 03/28/2015  . Allergic rhinitis 03/28/2015  . Absolute anemia 03/28/2015  . Anxiety and depression 03/28/2015  . Apnea, sleep 03/28/2015  . Arthritis 03/28/2015  . Cervical pain 03/28/2015  . Chronic headache 03/28/2015  . History of colonic polyps 03/28/2015  . H/O: osteoarthritis 03/28/2015  . Clinical depression 03/28/2015  . Diverticulosis of colon 03/28/2015  . DDD (degenerative disc disease), lumbar 03/28/2015  . Acid reflux 03/28/2015  . 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 Essential hypertension 03/28/2015  . Adaptive colitis 03/28/2015  . Narrowing of intervertebral disc space 03/28/2015  . Osteopenia 03/28/2015  . OP (osteoporosis) 03/28/2015  . Nasal septal perforation 03/28/2015  . Recurrent sinus infections 03/28/2015  . Spinal stenosis 03/28/2015  . Gastric ulcer 03/28/2015  . Cerebral artery occlusion with cerebral infarction 03/28/2015  . Acquired trigger finger 03/28/2015  . Avitaminosis D 03/28/2015  . Aortic atherosclerosis 07/13/2014  . Leg pain 07/13/2014  . H/O adenomatous polyp of colon 06/27/2005   Family History  Problem Relation Age of Onset  . Heart  disease Mother   . Depression Mother   . Colon polyps Mother   . Heart disease Father   . Heart disease Brother   . Heart disease Brother    History   Social History  . Marital Status: Married    Spouse Name: N/A  . Number of Children: N/A  . Years of Education: N/A   Occupational History  . Not on file.   Social History Main Topics  . Smoking status: Never Smoker   . Smokeless tobacco: Never Used  . Alcohol Use: No  . Drug Use: No  . Sexual Activity: Not on file   Other Topics Concern  . Not on file   Social History Narrative   Past Surgical History  Procedure Laterality Date  . Carpal tunnel release    . Tonsillectomy    . Abdominal hysterectomy    . Temporal artery biopsy / ligation    . Adenoidectomy    . R/o lymph nodes; right axilla    . Arthroscopic knee surgery    . Cardiac catheterization      [facility]  . Appendectomy    . Cholecystectomy     Previous Medications   ACETAMINOPHEN (TYLENOL) 500 MG TABLET    Take 500 mg by mouth 3 (three) times daily as needed.   ALENDRONATE (FOSAMAX) 70 MG TABLET    Take by mouth.   ASPIRIN 81 MG TABLET    Take 81 mg by mouth daily.   ATORVASTATIN (LIPITOR) 20 MG TABLET    Take 20 mg by mouth daily.   AZELASTINE (ASTELIN) 0.1 % NASAL SPRAY    Place 1 spray into both nostrils as needed  for rhinitis. Use in each nostril as directed   CALCIUM CARBONATE-VITAMIN D 600-200 MG-UNIT CAPS    Take by mouth 2 (two) times daily.   CETIRIZINE (ZYRTEC) 10 MG TABLET    Take by mouth.   CHOLECALCIFEROL (VITAMIN D) 1000 UNITS TABLET    Take 1,000 Units by mouth daily.   CRANBERRY 425 MG CAPS    Take by mouth 2 (two) times daily.   DOCUSATE SODIUM (COLACE) 100 MG CAPSULE    Take 100 mg by mouth as needed for mild constipation.   DULOXETINE (CYMBALTA) 60 MG CAPSULE    Take 60 mg by mouth daily.   FLUTICASONE (FLONASE) 50 MCG/ACT NASAL SPRAY    Place 2 sprays into both nostrils daily.   GABAPENTIN (NEURONTIN) 300 MG CAPSULE    Take 300 mg by  mouth at bedtime.   LISINOPRIL-HYDROCHLOROTHIAZIDE (PRINZIDE,ZESTORETIC) 20-25 MG PER TABLET    Take 1 tablet by mouth daily.   MAGNESIUM GLUCONATE 250 MG TABS    Take by mouth daily.   MECLIZINE (ANTIVERT) 25 MG TABLET    Take 25 mg by mouth as needed.    NITROFURANTOIN, MACROCRYSTAL-MONOHYDRATE, (MACROBID) 100 MG CAPSULE    Take 100 mg by mouth as needed.    OMEGA-3 FATTY ACIDS (FISH OIL) 1200 MG CAPS    Take by mouth daily.   OMEPRAZOLE (PRILOSEC) 20 MG CAPSULE    Take 20 mg by mouth daily.   PHENAZOPYRIDINE (PYRIDIUM) 200 MG TABLET    Take 200 mg by mouth as needed for pain.   POTASSIUM GLUCONATE 595 MG CAPS    Take by mouth daily.   RANITIDINE (ZANTAC) 150 MG TABLET    Take 150 mg by mouth as needed for heartburn.   SALINE NASAL SPRAY NA    Place into the nose as needed.   TRAMADOL (ULTRAM) 50 MG TABLET    Take 50 mg by mouth 2 (two) times daily as needed.    Allergies  Allergen Reactions  . Amoxicillin   . Clindamycin/Lincomycin   . Doxycycline   . Levaquin [Levofloxacin In D5w]   . Mirabegron Nausea And Vomiting  . Nsaids Other (See Comments)    GI upset  . Phenergan [Promethazine Hcl]   . Singulair [Montelukast]   . Vicodin [Hydrocodone-Acetaminophen]   . Zocor [Simvastatin]   . Zoloft  [Sertraline] Other (See Comments)  . Ampicillin Rash  . Penicillins Rash    Ampicillin, Clindamycin, Doxycycline Levaquin-severe headache, muscle and joint aches, weak   BP 114/68 mmHg  Pulse 84  Temp(Src) 98.4 F (36.9 C) (Oral)  Resp 16  Wt 176 lb (79.833 kg)    Review of Systems  Constitutional: Positive for fatigue. Negative for fever, chills, diaphoresis, activity change, appetite change and unexpected weight change.  Gastrointestinal: Negative for nausea, vomiting, abdominal pain, diarrhea, constipation, blood in stool, abdominal distention, anal bleeding and rectal pain.  Endocrine: Positive for polyuria. Negative for cold intolerance, heat intolerance, polydipsia and  polyphagia.  Genitourinary: Positive for dysuria, urgency and pelvic pain. Negative for hesitancy, frequency, hematuria, vaginal bleeding, vaginal discharge, difficulty urinating and vaginal pain.  Musculoskeletal: Positive for back pain.       Objective:   Physical Exam   General Appearance:    Alert, cooperative, no distress  Eyes:    PERRL, conjunctiva/corneas clear, EOM's intact       Lungs:     Clear to auscultation bilaterally, respirations unlabored  Heart:    Regular rate and rhythm  Neurologic:  Awake, alert, oriented x 3. No apparent focal neurological           defect.   Abd:   Mild suprapubic tenderness. Nov CVAT     U/a Moderate LE. Nitrate Negatice   Results for orders placed or performed in visit on 05/09/15  Urine culture  Result Value Ref Range   Urine Culture, Routine Final report (A)    Result 1 Escherichia coli (A)    ANTIMICROBIAL SUSCEPTIBILITY Comment        Assessment & Plan:   1. Acute cystitis without hematuria  - Urine culture - ciprofloxacin (CIPRO) 500 MG tablet; Take 1 tablet (500 mg total) by mouth 2 (two) times daily.  Dispense: 14 tablet; Refill: 0

## 2015-05-09 NOTE — Telephone Encounter (Signed)
Pt made appointment for today.   Thanks,   -Mickel Baas

## 2015-05-11 ENCOUNTER — Telehealth: Payer: Self-pay | Admitting: *Deleted

## 2015-05-11 LAB — URINE CULTURE

## 2015-05-11 MED ORDER — NITROFURANTOIN MACROCRYSTAL 100 MG PO CAPS: 100.0000 mg | ORAL_CAPSULE | Freq: Two times a day (BID) | ORAL | Status: DC

## 2015-05-11 NOTE — Telephone Encounter (Signed)
-----   Message from Birdie Sons, MD sent at 05/11/2015 11:36 AM EDT ----- Please advise patient UTI is resistant to cipro. Need to change to nitrofurantoin 100mg  one tablet twice a day. X 10 days

## 2015-05-30 ENCOUNTER — Encounter: Payer: Self-pay | Admitting: Family Medicine

## 2015-05-30 ENCOUNTER — Ambulatory Visit (INDEPENDENT_AMBULATORY_CARE_PROVIDER_SITE_OTHER): Payer: Medicare Other | Admitting: Family Medicine

## 2015-05-30 VITALS — BP 112/50 | HR 80 | Temp 98.0°F | Resp 16 | Ht 60.25 in | Wt 174.0 lb

## 2015-05-30 DIAGNOSIS — F329 Major depressive disorder, single episode, unspecified: Secondary | ICD-10-CM

## 2015-05-30 DIAGNOSIS — M5136 Other intervertebral disc degeneration, lumbar region: Secondary | ICD-10-CM | POA: Diagnosis not present

## 2015-05-30 DIAGNOSIS — G4730 Sleep apnea, unspecified: Secondary | ICD-10-CM | POA: Diagnosis not present

## 2015-05-30 DIAGNOSIS — M4800 Spinal stenosis, site unspecified: Secondary | ICD-10-CM

## 2015-05-30 DIAGNOSIS — F32A Depression, unspecified: Secondary | ICD-10-CM

## 2015-05-30 MED ORDER — GABAPENTIN 300 MG PO CAPS: 300.0000 mg | ORAL_CAPSULE | Freq: Two times a day (BID) | ORAL | Status: DC

## 2015-05-30 MED ORDER — OMEPRAZOLE 20 MG PO CPDR: 20.0000 mg | DELAYED_RELEASE_CAPSULE | Freq: Every day | ORAL | Status: DC

## 2015-05-30 MED ORDER — LISINOPRIL-HYDROCHLOROTHIAZIDE 20-25 MG PO TABS: 1.0000 | ORAL_TABLET | Freq: Every day | ORAL | Status: DC

## 2015-05-30 MED ORDER — DULOXETINE HCL 30 MG PO CPEP: 30.0000 mg | ORAL_CAPSULE | Freq: Every day | ORAL | Status: DC

## 2015-05-30 MED ORDER — ATORVASTATIN CALCIUM 20 MG PO TABS: 20.0000 mg | ORAL_TABLET | Freq: Every day | ORAL | Status: DC

## 2015-05-30 NOTE — Progress Notes (Signed)
Subjective:    Patient ID: Helen Ramsey, female    DOB: 06-30-1933, 81 y.o.   MRN: [id number]  Hypertension This is a chronic problem. The problem is unchanged. The problem is controlled (120s/ 50-70). Associated symptoms include anxiety, blurred vision (has eye appointment scheduled), headaches, malaise/fatigue, neck pain, orthopnea (sleep apnea, being F/B neurology), peripheral edema and shortness of breath. Pertinent negatives include no chest pain or palpitations. Risk factors for coronary artery disease include dyslipidemia, obesity and stress. Treatments tried: Lisinopril-HCTZ 20-25 mg. The current treatment provides moderate improvement. Compliance problems include exercise (Pt has difficulty exercising to chronic pain).   Hyperlipidemia This is a chronic problem. The problem is controlled. Associated symptoms include shortness of breath. Pertinent negatives include no chest pain. Current antihyperlipidemic treatment includes statins (Atorvastatin 20 mg). The current treatment provides moderate improvement of lipids.   Labs checked on 12/19/2014 by Dr. Jacqualine Code. Results: Total: 186 Triglycerides: 125 HDL: 48 LDL: 113  Back Pain Pt reports that she was put on Cymbalta by the pain clinic for back pain. Pt decreased the dose on this medication since LOV, and pt is requesting to D/C Cymbalta altogether. But is not sure she should. Is having more crying spells. Will stay at current does for now.   As for her magnesium,  Thinks it has helped her cramps.   Having some trouble with her CPAP machine.  Does not use it. Has talked to Dr. Manuella Ghazi about it. May need another sleep study. Last was in 2003.  Waiting to have another set up. Will let me know if does not work out.   As for headaches, has occipital neuralgia. Treated by Dr. Manuella Ghazi.   Review of Systems  Constitutional: Positive for malaise/fatigue, activity change and fatigue. Negative for fever, chills, diaphoresis, appetite change and  unexpected weight change.  Eyes: Positive for blurred vision (has eye appointment scheduled).  Respiratory: Positive for apnea and shortness of breath.   Cardiovascular: Positive for orthopnea (sleep apnea, being F/B neurology) and leg swelling. Negative for chest pain and palpitations.  Musculoskeletal: Positive for back pain and neck pain.  Neurological: Positive for headaches.  Psychiatric/Behavioral: Positive for dysphoric mood. The patient is nervous/anxious.    BP 112/50 mmHg  Pulse 80  Temp(Src) 98 F (36.7 C) (Oral)  Resp 16  Ht 5' 0.25" (1.53 m)  Wt 174 lb (78.926 kg)  BMI 33.72 kg/m2   Patient Active Problem List   Diagnosis Date Noted  . Adenomatous colon polyp 05/09/2015  . HLD (hyperlipidemia) 05/09/2015  . Restless leg 05/09/2015  . Abnormal magnetic resonance imaging study 03/28/2015  . Allergic rhinitis 03/28/2015  . Absolute anemia 03/28/2015  . Anxiety and depression 03/28/2015  . Apnea, sleep 03/28/2015  . Arthritis 03/28/2015  . Cervical pain 03/28/2015  . Chronic headache 03/28/2015  . History of colonic polyps 03/28/2015  . H/O: osteoarthritis 03/28/2015  . Clinical depression 03/28/2015  . Diverticulosis of colon 03/28/2015  . DDD (degenerative disc disease), lumbar 03/28/2015  . Acid reflux 03/28/2015  . 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 Essential hypertension 03/28/2015  . Adaptive colitis 03/28/2015  . Narrowing of intervertebral disc space 03/28/2015  . Osteopenia 03/28/2015  . OP (osteoporosis) 03/28/2015  . Nasal septal perforation 03/28/2015  . Recurrent sinus infections 03/28/2015  . Spinal stenosis 03/28/2015  . Gastric ulcer 03/28/2015  . Cerebral artery occlusion with cerebral infarction 03/28/2015  . Acquired trigger finger 03/28/2015  . Avitaminosis D 03/28/2015  . Aortic atherosclerosis 07/13/2014  .  Leg pain 07/13/2014  . H/O adenomatous polyp of colon 06/27/2005   Past Medical History  Diagnosis  Date  . Allergic rhinitis, cause unspecified   . Urinary frequency   . Dizziness and giddiness   . Atherosclerosis of aorta   . Unspecified essential hypertension   . Other and unspecified hyperlipidemia   . Degeneration of lumbar or lumbosacral intervertebral disc   . Unspecified sleep apnea   . Allergic rhinitis, cause unspecified   . Headache(784.0)   . Dysthymic disorder   . Myalgia and myositis, unspecified   . Osteoporosis, unspecified   . Trigger finger (acquired)   . Other specified disorders of bladder   . Diverticulitis of colon (without mention of hemorrhage)   . Other diseases of nasal cavity and sinuses(478.19)    Current Outpatient Prescriptions on File Prior to Visit  Medication Sig  . acetaminophen (TYLENOL) 500 MG tablet Take 500 mg by mouth 3 (three) times daily as needed.  Marland Kitchen alendronate (FOSAMAX) 70 MG tablet Take by mouth.  Marland Kitchen aspirin 81 MG tablet Take 81 mg by mouth daily.  Marland Kitchen atorvastatin (LIPITOR) 20 MG tablet Take 20 mg by mouth daily.  Marland Kitchen azelastine (ASTELIN) 0.1 % nasal spray Place 1 spray into both nostrils as needed for rhinitis. Use in each nostril as directed  . Calcium Carbonate-Vitamin D 600-200 MG-UNIT CAPS Take by mouth 2 (two) times daily.  . cetirizine (ZYRTEC) 10 MG tablet Take by mouth.  . cholecalciferol (VITAMIN D) 1000 UNITS tablet Take 1,000 Units by mouth daily.  . Cranberry 425 MG CAPS Take by mouth 2 (two) times daily.  Marland Kitchen docusate sodium (COLACE) 100 MG capsule Take 100 mg by mouth as needed for mild constipation.  . DULoxetine (CYMBALTA) 60 MG capsule Take 60 mg by mouth daily.  . fluticasone (FLONASE) 50 MCG/ACT nasal spray Place 2 sprays into both nostrils daily.  Marland Kitchen gabapentin (NEURONTIN) 300 MG capsule Take 300 mg by mouth at bedtime.  Marland Kitchen lisinopril-hydrochlorothiazide (PRINZIDE,ZESTORETIC) 20-25 MG per tablet Take 1 tablet by mouth daily.  . Magnesium Gluconate 250 MG TABS Take by mouth daily.  . meclizine (ANTIVERT) 25 MG tablet Take  25 mg by mouth as needed.   . Omega-3 Fatty Acids (FISH OIL) 1200 MG CAPS Take by mouth daily.  Marland Kitchen omeprazole (PRILOSEC) 20 MG capsule Take 20 mg by mouth daily.  . Potassium Gluconate 595 MG CAPS Take by mouth daily.  . ranitidine (ZANTAC) 150 MG tablet Take 150 mg by mouth as needed for heartburn.  . traMADol (ULTRAM) 50 MG tablet Take 50 mg by mouth 2 (two) times daily as needed.   No current facility-administered medications on file prior to visit.   Allergies  Allergen Reactions  . Amoxicillin   . Clindamycin/Lincomycin   . Doxycycline   . Levaquin [Levofloxacin In D5w]   . Mirabegron Nausea And Vomiting  . Nsaids Other (See Comments)    GI upset  . Phenergan [Promethazine Hcl]   . Singulair [Montelukast]   . Vicodin [Hydrocodone-Acetaminophen]   . Zocor [Simvastatin]   . Zoloft  [Sertraline] Other (See Comments)  . Ampicillin Rash  . Penicillins Rash    Ampicillin, Clindamycin, Doxycycline Levaquin-severe headache, muscle and joint aches, weak   Past Surgical History  Procedure Laterality Date  . Carpal tunnel release    . Tonsillectomy    . Abdominal hysterectomy    . Temporal artery biopsy / ligation    . Adenoidectomy    . R/o lymph nodes; right  axilla    . Arthroscopic knee surgery    . Cardiac catheterization      [facility]  . Appendectomy    . Cholecystectomy     History   Social History  . Marital Status: Widowed    Spouse Name: N/A  . Number of Children: N/A  . Years of Education: N/A   Occupational History  . Not on file.   Social History Main Topics  . Smoking status: Never Smoker   . Smokeless tobacco: Never Used  . Alcohol Use: No  . Drug Use: No  . Sexual Activity: Not on file   Other Topics Concern  . Not on file   Social History Narrative   Family History  Problem Relation Age of Onset  . Heart disease Mother   . Depression Mother   . Colon polyps Mother   . Heart disease Father   . Heart disease Brother   . Heart disease Brother        Objective:   Physical Exam  Constitutional: She is oriented to person, place, and time. She appears well-developed and well-nourished.  Neurological: She is alert and oriented to person, place, and time.  Psychiatric: She has a normal mood and affect. Her behavior is normal. Judgment and thought content normal.  BP 112/50 mmHg  Pulse 80  Temp(Src) 98 F (36.7 C) (Oral)  Resp 16  Ht 5' 0.25" (1.53 m)  Wt 174 lb (78.926 kg)  BMI 33.72 kg/m2     Assessment & Plan:   1. Sleep apnea Awaiting sleep study. Needs to restart CPAP.   2. Spinal stenosis, unspecified spinal region Continue Ultram and Duloxetine at lower dose.   3. DDD (degenerative disc disease), lumbar Improved some. Thinking about Dr. Sharlet Salina.  4. Clinical depression Will continue Duloxetine at 30 mg a day. More emotional.  Will not decrease.   5. Refilled several meds to day and reviewed medication at length today.   Margarita Rana, MD

## 2015-06-06 ENCOUNTER — Ambulatory Visit: Payer: Medicare Other | Attending: Neurology

## 2015-06-06 DIAGNOSIS — G4733 Obstructive sleep apnea (adult) (pediatric): Secondary | ICD-10-CM | POA: Diagnosis not present

## 2015-06-08 DIAGNOSIS — G4733 Obstructive sleep apnea (adult) (pediatric): Secondary | ICD-10-CM | POA: Diagnosis not present

## 2015-06-27 ENCOUNTER — Other Ambulatory Visit: Payer: Self-pay

## 2015-06-27 DIAGNOSIS — M1990 Unspecified osteoarthritis, unspecified site: Secondary | ICD-10-CM

## 2015-06-27 DIAGNOSIS — J309 Allergic rhinitis, unspecified: Secondary | ICD-10-CM

## 2015-06-27 MED ORDER — FLUTICASONE PROPIONATE 50 MCG/ACT NA SUSP: 2.0000 | Freq: Every day | NASAL | Status: DC

## 2015-06-27 MED ORDER — TRAMADOL HCL 50 MG PO TABS: 50.0000 mg | ORAL_TABLET | Freq: Two times a day (BID) | ORAL | Status: DC | PRN

## 2015-06-27 NOTE — Telephone Encounter (Signed)
Please call in prescription for Flonase. Thanks.

## 2015-07-03 ENCOUNTER — Ambulatory Visit: Payer: Medicare Other | Attending: Neurology

## 2015-07-03 DIAGNOSIS — G4733 Obstructive sleep apnea (adult) (pediatric): Secondary | ICD-10-CM | POA: Insufficient documentation

## 2015-07-11 DIAGNOSIS — G4733 Obstructive sleep apnea (adult) (pediatric): Secondary | ICD-10-CM | POA: Diagnosis not present

## 2015-07-12 DIAGNOSIS — Z961 Presence of intraocular lens: Secondary | ICD-10-CM | POA: Diagnosis not present

## 2015-08-29 ENCOUNTER — Ambulatory Visit (INDEPENDENT_AMBULATORY_CARE_PROVIDER_SITE_OTHER): Payer: Medicare Other | Admitting: Family Medicine

## 2015-08-29 ENCOUNTER — Encounter: Payer: Self-pay | Admitting: Family Medicine

## 2015-08-29 VITALS — BP 110/68 | HR 74 | Temp 98.3°F | Resp 16 | Ht 60.0 in | Wt 177.0 lb

## 2015-08-29 DIAGNOSIS — F32A Depression, unspecified: Secondary | ICD-10-CM

## 2015-08-29 DIAGNOSIS — M25561 Pain in right knee: Secondary | ICD-10-CM | POA: Insufficient documentation

## 2015-08-29 DIAGNOSIS — G4730 Sleep apnea, unspecified: Secondary | ICD-10-CM | POA: Diagnosis not present

## 2015-08-29 DIAGNOSIS — M25562 Pain in left knee: Secondary | ICD-10-CM

## 2015-08-29 DIAGNOSIS — F329 Major depressive disorder, single episode, unspecified: Secondary | ICD-10-CM

## 2015-08-29 DIAGNOSIS — I10 Essential (primary) hypertension: Secondary | ICD-10-CM | POA: Diagnosis not present

## 2015-08-29 DIAGNOSIS — Z23 Encounter for immunization: Secondary | ICD-10-CM | POA: Diagnosis not present

## 2015-08-29 DIAGNOSIS — M1990 Unspecified osteoarthritis, unspecified site: Secondary | ICD-10-CM | POA: Diagnosis not present

## 2015-08-29 DIAGNOSIS — M5136 Other intervertebral disc degeneration, lumbar region: Secondary | ICD-10-CM

## 2015-08-29 MED ORDER — GABAPENTIN 300 MG PO CAPS: 300.0000 mg | ORAL_CAPSULE | Freq: Two times a day (BID) | ORAL | Status: DC

## 2015-08-29 MED ORDER — DULOXETINE HCL 30 MG PO CPEP: 30.0000 mg | ORAL_CAPSULE | Freq: Every day | ORAL | Status: DC

## 2015-08-29 MED ORDER — TRAMADOL HCL 50 MG PO TABS: 50.0000 mg | ORAL_TABLET | Freq: Two times a day (BID) | ORAL | Status: DC | PRN

## 2015-08-29 NOTE — Progress Notes (Signed)
Patient ID: Helen Ramsey, female   DOB: July 02, 1933, 81 y.o.   MRN: [id number]        Patient: Helen Ramsey Female    DOB: 1933-09-23   81 y.o.   MRN: [id number] Visit Date: 08/29/2015  Today's Provider: Margarita Rana, MD   Chief Complaint  Patient presents with  . Depression  . Hypertension   Subjective:    HPI   Depression, Follow-up  She  was last seen for this 3 months ago. Changes made at last visit include decreasing Cymbalta to 30 mg daily.    She reports excellent compliance with treatment. She is not having side effects.   She reports good tolerance of treatment. Current symptoms include: depressed mood She feels she is Improved since last visit.  Is wondering if should stop but has had ups and downs.   ------------------------------------------------------------------------   Hypertension, follow-up:  BP Readings from Last 3 Encounters:  08/29/15 110/68  05/30/15 112/50  05/09/15 114/68    She was last seen for hypertension 3 months ago.  BP at that visit was 112/50. Management changes since that visit include none. She reports excellent compliance with treatment. She is not having side effects.  She is not exercising. She is adherent to low salt diet.   Outside blood pressures are WNL. She is experiencing none.  Patient denies chest pain.   Cardiovascular risk factors include hypertension.  Use of agents associated with hypertension: none.   Blood pressure was up at health screening.   Weight trend: stable Wt Readings from Last 3 Encounters:  08/29/15 177 lb (80.287 kg)  05/30/15 174 lb (78.926 kg)  05/09/15 176 lb (79.833 kg)    Current diet: in general, a "healthy" diet     Concerned that may have an abdominal hernia.  Is also concerned that back exercises make her worse. Tai chi makes her dizzy.  Thinking about trying it at home.  Does get SOB with walking.   Does not do much. Has not tried to increase it.    Is using biPAP  machine regularly. Trying to adjust it.    ------------------------------------------------------------------------        Allergies  Allergen Reactions  . Amoxicillin   . Clindamycin/Lincomycin   . Doxycycline   . Levaquin [Levofloxacin In D5w]   . Mirabegron Nausea And Vomiting  . Nsaids Other (See Comments)    GI upset  . Phenergan [Promethazine Hcl]   . Singulair [Montelukast]   . Vicodin [Hydrocodone-Acetaminophen]   . Zocor [Simvastatin]   . Zoloft  [Sertraline] Other (See Comments)  . Ampicillin Rash  . Penicillins Rash    Ampicillin, Clindamycin, Doxycycline Levaquin-severe headache, muscle and joint aches, weak   Previous Medications   ACETAMINOPHEN (TYLENOL) 500 MG TABLET    Take 500 mg by mouth 3 (three) times daily as needed.   ALENDRONATE (FOSAMAX) 70 MG TABLET    Take 70 mg by mouth once a week.    ASPIRIN 81 MG TABLET    Take 81 mg by mouth daily.   ATORVASTATIN (LIPITOR) 20 MG TABLET    Take 1 tablet (20 mg total) by mouth daily.   AZELASTINE (ASTELIN) 0.1 % NASAL SPRAY    Place 1 spray into both nostrils as needed for rhinitis. Use in each nostril as directed   CALCIUM CARBONATE-VITAMIN D 600-200 MG-UNIT CAPS    Take by mouth 2 (two) times daily.   CETIRIZINE (ZYRTEC) 10 MG TABLET    Take by mouth.  CHOLECALCIFEROL (VITAMIN D) 1000 UNITS TABLET    Take 1,000 Units by mouth daily.   DOCUSATE SODIUM (COLACE) 100 MG CAPSULE    Take 100 mg by mouth as needed for mild constipation.   DULOXETINE (CYMBALTA) 30 MG CAPSULE    Take 1 capsule (30 mg total) by mouth daily.   FLUTICASONE (FLONASE) 50 MCG/ACT NASAL SPRAY    Place 2 sprays into both nostrils daily.   GABAPENTIN (NEURONTIN) 300 MG CAPSULE    Take 1 capsule (300 mg total) by mouth 2 (two) times daily.   LISINOPRIL-HYDROCHLOROTHIAZIDE (PRINZIDE,ZESTORETIC) 20-25 MG PER TABLET    Take 1 tablet by mouth daily.   MAGNESIUM GLUCONATE 250 MG TABS    Take by mouth daily.   OMEGA-3 FATTY ACIDS (FISH OIL) 1200 MG  CAPS    Take by mouth daily.   OMEPRAZOLE (PRILOSEC) 20 MG CAPSULE    Take 1 capsule (20 mg total) by mouth daily.   POTASSIUM GLUCONATE 595 MG CAPS    Take by mouth daily.   RANITIDINE (ZANTAC) 150 MG TABLET    Take 150 mg by mouth as needed for heartburn.   TRAMADOL (ULTRAM) 50 MG TABLET    Take 1 tablet (50 mg total) by mouth 2 (two) times daily as needed.    Review of Systems  Constitutional: Negative.   HENT: Positive for ear pain and sinus pressure.   Eyes: Negative.   Respiratory: Positive for shortness of breath.   Cardiovascular: Negative.   Neurological: Positive for headaches.    Social History  Substance Use Topics  . Smoking status: Never Smoker   . Smokeless tobacco: Never Used  . Alcohol Use: No   Objective:   BP 110/68 mmHg  Pulse 74  Temp(Src) 98.3 F (36.8 C) (Oral)  Resp 16  Ht 5' (1.524 m)  Wt 177 lb (80.287 kg)  BMI 34.57 kg/m2  SpO2 97%  Physical Exam  Constitutional: She is oriented to person, place, and time. She appears well-developed and well-nourished.  Cardiovascular: Normal rate and regular rhythm.   Pulmonary/Chest: Effort normal and breath sounds normal.  Neurological: She is alert and oriented to person, place, and time.  Psychiatric: She has a normal mood and affect. Her behavior is normal. Judgment and thought content normal.      Assessment & Plan:     1. Clinical depression Stable. Will continue current dose.  - DULoxetine (CYMBALTA) 30 MG capsule; Take 1 capsule (30 mg total) by mouth daily.  Dispense: 90 capsule; Refill: 0  2. Essential hypertension Stable. Continue medication.   3. DDD (degenerative disc disease), lumbar Stable on current medication.  Encouraged patient to improve walking.   - gabapentin (NEURONTIN) 300 MG capsule; Take 1 capsule (300 mg total) by mouth 2 (two) times daily.  Dispense: 180 capsule; Refill: 1  4. Arthritis Will continue medication. Has had no trouble with current dose of Cymbalta and Ultram.  Has been approved by pain clinic,  But told her to be very careful not to increase. - traMADol (ULTRAM) 50 MG tablet; Take 1 tablet (50 mg total) by mouth 2 (two) times daily as needed.  Dispense: 180 tablet; Refill: 1  5. Need for influenza vaccination Given today.  - Flu vaccine HIGH DOSE PF  6. Need for pneumococcal vaccination Given today.  - Pneumococcal conjugate vaccine 13-valent IM  7. Sleep apnea Stable. Continue BiPAP.   8. Knee pain, bilateral Will refer for injections. Has helped in the past.  Is going  to check on Voltaren gel.  - AMB referral to orthopedics       Margarita Rana, MD  [facility]

## 2015-08-31 ENCOUNTER — Other Ambulatory Visit: Payer: Self-pay | Admitting: Family Medicine

## 2015-08-31 DIAGNOSIS — F329 Major depressive disorder, single episode, unspecified: Secondary | ICD-10-CM

## 2015-08-31 DIAGNOSIS — F32A Depression, unspecified: Secondary | ICD-10-CM

## 2015-09-18 DIAGNOSIS — M25562 Pain in left knee: Secondary | ICD-10-CM | POA: Diagnosis not present

## 2015-09-18 DIAGNOSIS — M170 Bilateral primary osteoarthritis of knee: Secondary | ICD-10-CM | POA: Diagnosis not present

## 2015-09-18 DIAGNOSIS — M25561 Pain in right knee: Secondary | ICD-10-CM | POA: Diagnosis not present

## 2015-10-15 DIAGNOSIS — M4806 Spinal stenosis, lumbar region: Secondary | ICD-10-CM | POA: Diagnosis not present

## 2015-10-15 DIAGNOSIS — M5136 Other intervertebral disc degeneration, lumbar region: Secondary | ICD-10-CM | POA: Diagnosis not present

## 2015-10-15 DIAGNOSIS — M5416 Radiculopathy, lumbar region: Secondary | ICD-10-CM | POA: Diagnosis not present

## 2015-10-18 DIAGNOSIS — R42 Dizziness and giddiness: Secondary | ICD-10-CM | POA: Diagnosis not present

## 2015-10-18 DIAGNOSIS — J019 Acute sinusitis, unspecified: Secondary | ICD-10-CM | POA: Diagnosis not present

## 2015-10-23 ENCOUNTER — Telehealth: Payer: Self-pay | Admitting: Family Medicine

## 2015-10-23 NOTE — Telephone Encounter (Signed)
Ok to put in same day. Thanks.

## 2015-10-23 NOTE — Telephone Encounter (Signed)
Pt is coming in tomorrow afternoon at 245. Thanks TNP

## 2015-10-23 NOTE — Telephone Encounter (Signed)
Pt called saying she has an appt in [place] for a CPE.  Her daughter thinks she need to be seen sooner about her leg pain and dizziness from possible mediations.  She does have a sinus infectionwhich she is being treated for by Dr. Richardson Landry.  Can we get her in this afternoon or this week.  Her call back is  [phone]  Thanks teri

## 2015-10-24 ENCOUNTER — Encounter: Payer: Self-pay | Admitting: Family Medicine

## 2015-10-24 ENCOUNTER — Ambulatory Visit (INDEPENDENT_AMBULATORY_CARE_PROVIDER_SITE_OTHER): Payer: Medicare Other | Admitting: Family Medicine

## 2015-10-24 VITALS — BP 132/38 | HR 96 | Temp 97.7°F | Resp 16 | Wt 178.0 lb

## 2015-10-24 DIAGNOSIS — R252 Cramp and spasm: Secondary | ICD-10-CM | POA: Diagnosis not present

## 2015-10-24 DIAGNOSIS — R519 Headache, unspecified: Secondary | ICD-10-CM

## 2015-10-24 DIAGNOSIS — I10 Essential (primary) hypertension: Secondary | ICD-10-CM

## 2015-10-24 DIAGNOSIS — R51 Headache: Secondary | ICD-10-CM | POA: Diagnosis not present

## 2015-10-24 NOTE — Progress Notes (Signed)
Subjective:    Patient ID: Helen Ramsey, female    DOB: 11-Dec-1932, 81 y.o.   MRN: [id number]  Dizziness This is a recurrent problem. The current episode started more than 1 month ago (4-6 weeks. Has had it previously. Has seen Dr. Richardson Landry for this in the past.  Also, saw neurologist .   Was diagnosed  occiipital nerve neurralgia.   ). Associated symptoms include abdominal pain, anorexia, a change in bowel habit, diaphoresis, fatigue and nausea. Pertinent negatives include no chest pain, rash or vertigo. Headaches: has cervical headaches.   Myalgias: leg cramps. Associated symptoms comments: Fall from her unsteadiness.  . Nothing aggravates the symptoms. She has tried nothing for the symptoms.  Leg Pain  The incident occurred more than 1 week ago. There was no injury mechanism. The pain is present in the left leg and right leg. The quality of the pain is described as cramping. The pain is at a severity of 5/10. The pain is moderate. The pain has been intermittent since onset. Exacerbated by: stopped the gatorade, much worse since stopped it.  Now trying mustard.      Also saw Dr. Richardson Landry for sinus infection.  Is on Bactrim.  Has helped some but not a lot.    Is on blood pressure medication.  Taking it daily. Unclear if dropping.   Patient is difficult historian.    Review of Systems  Constitutional: Positive for diaphoresis and fatigue.  Respiratory: Positive for shortness of breath (pt's baseline).   Cardiovascular: Negative for chest pain.  Gastrointestinal: Positive for nausea, abdominal pain, anorexia and change in bowel habit.  Musculoskeletal: Myalgias: leg cramps.  Skin: Negative for rash.  Neurological: Positive for dizziness. Negative for vertigo. Headaches: has cervical headaches.     BP 132/38 mmHg  Pulse 96  Temp(Src) 97.7 F (36.5 C) (Oral)  Resp 16  Wt 178 lb (80.74 kg)   Patient Active Problem List   Diagnosis Date Noted  . Knee pain, bilateral 08/29/2015  .  Sleep apnea 05/30/2015  . Adenomatous colon polyp 05/09/2015  . HLD (hyperlipidemia) 05/09/2015  . Restless leg 05/09/2015  . Abnormal magnetic resonance imaging study 03/28/2015  . Allergic rhinitis 03/28/2015  . Absolute anemia 03/28/2015  . Anxiety and depression 03/28/2015  . Arthritis 03/28/2015  . Cervical pain 03/28/2015  . Chronic headache 03/28/2015  . History of colonic polyps 03/28/2015  . H/O: osteoarthritis 03/28/2015  . Clinical depression 03/28/2015  . Diverticulosis of colon 03/28/2015  . DDD (degenerative disc disease), lumbar 03/28/2015  . Acid reflux 03/28/2015  . 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 Essential hypertension 03/28/2015  . Adaptive colitis 03/28/2015  . Narrowing of intervertebral disc space 03/28/2015  . OP (osteoporosis) 03/28/2015  . Nasal septal perforation 03/28/2015  . Recurrent sinus infections 03/28/2015  . Spinal stenosis 03/28/2015  . Gastric ulcer 03/28/2015  . Cerebral artery occlusion with cerebral infarction ([place]) 03/28/2015  . Acquired trigger finger 03/28/2015  . Avitaminosis D 03/28/2015  . Aortic atherosclerosis ([place]) 07/13/2014  . Leg pain 07/13/2014  . H/O adenomatous polyp of colon 06/27/2005   Past Medical History  Diagnosis Date  . Allergic rhinitis, cause unspecified   . Urinary frequency   . Dizziness and giddiness   . Atherosclerosis of aorta ([place])   . Unspecified essential hypertension   . Other and unspecified hyperlipidemia   . Degeneration of lumbar or lumbosacral intervertebral disc   . Unspecified sleep apnea   . Allergic rhinitis, cause unspecified   .  Headache(784.0)   . Dysthymic disorder   . Myalgia and myositis, unspecified   . Osteoporosis, unspecified   . Trigger finger (acquired)   . Other specified disorders of bladder   . Diverticulitis of colon (without mention of hemorrhage)   . Other diseases of nasal cavity and sinuses(478.19)    Current Outpatient  Prescriptions on File Prior to Visit  Medication Sig  . acetaminophen (TYLENOL) 500 MG tablet Take 500 mg by mouth 3 (three) times daily as needed.  Marland Kitchen alendronate (FOSAMAX) 70 MG tablet Take 70 mg by mouth once a week.   Marland Kitchen aspirin 81 MG tablet Take 81 mg by mouth daily.  Marland Kitchen atorvastatin (LIPITOR) 20 MG tablet Take 1 tablet (20 mg total) by mouth daily.  Marland Kitchen azelastine (ASTELIN) 0.1 % nasal spray Place 1 spray into both nostrils as needed for rhinitis. Use in each nostril as directed  . Calcium Carbonate-Vitamin D 600-200 MG-UNIT CAPS Take by mouth 2 (two) times daily.  . cetirizine (ZYRTEC) 10 MG tablet Take by mouth.  . cholecalciferol (VITAMIN D) 1000 UNITS tablet Take 1,000 Units by mouth daily.  Marland Kitchen docusate sodium (COLACE) 100 MG capsule Take 100 mg by mouth as needed for mild constipation.  . DULoxetine (CYMBALTA) 30 MG capsule TAKE ONE (1) CAPSULE EACH DAY  . fluticasone (FLONASE) 50 MCG/ACT nasal spray Place 2 sprays into both nostrils daily.  Marland Kitchen gabapentin (NEURONTIN) 300 MG capsule Take 1 capsule (300 mg total) by mouth 2 (two) times daily.  Marland Kitchen lisinopril-hydrochlorothiazide (PRINZIDE,ZESTORETIC) 20-25 MG per tablet Take 1 tablet by mouth daily.  . Magnesium Gluconate 250 MG TABS Take by mouth daily.  . Omega-3 Fatty Acids (FISH OIL) 1200 MG CAPS Take by mouth daily.  Marland Kitchen omeprazole (PRILOSEC) 20 MG capsule Take 1 capsule (20 mg total) by mouth daily.  . Potassium Gluconate 595 MG CAPS Take by mouth daily.  . ranitidine (ZANTAC) 150 MG tablet Take 150 mg by mouth as needed for heartburn.  . traMADol (ULTRAM) 50 MG tablet Take 1 tablet (50 mg total) by mouth 2 (two) times daily as needed.   No current facility-administered medications on file prior to visit.   Allergies  Allergen Reactions  . Amoxicillin   . Clindamycin/Lincomycin   . Doxycycline   . Levaquin [Levofloxacin In D5w]   . Mirabegron Nausea And Vomiting  . Nsaids Other (See Comments)    GI upset  . Phenergan [Promethazine  Hcl]   . Singulair [Montelukast]   . Vicodin [Hydrocodone-Acetaminophen]   . Zocor [Simvastatin]   . Zoloft  [Sertraline] Other (See Comments)  . Ampicillin Rash  . Penicillins Rash    Ampicillin, Clindamycin, Doxycycline Levaquin-severe headache, muscle and joint aches, weak   Past Surgical History  Procedure Laterality Date  . Carpal tunnel release    . Tonsillectomy    . Abdominal hysterectomy    . Temporal artery biopsy / ligation    . Adenoidectomy    . R/o lymph nodes; right axilla    . Arthroscopic knee surgery    . Cardiac catheterization      [facility]  . Appendectomy    . Cholecystectomy     Social History   Social History  . Marital Status: Widowed    Spouse Name: N/A  . Number of Children: N/A  . Years of Education: N/A   Occupational History  . Not on file.   Social History Main Topics  . Smoking status: Never Smoker   . Smokeless tobacco: Never Used  .  Alcohol Use: No  . Drug Use: No  . Sexual Activity: Not on file   Other Topics Concern  . Not on file   Social History Narrative   Family History  Problem Relation Age of Onset  . Heart disease Mother   . Depression Mother   . Colon polyps Mother   . Heart disease Father   . Heart disease Brother   . Heart disease Brother       Objective:   Physical Exam  Constitutional: She is oriented to person, place, and time. She appears well-developed and well-nourished.  Cardiovascular: Normal rate and regular rhythm.   Pulmonary/Chest: Effort normal and breath sounds normal.  Neurological: She is alert and oriented to person, place, and time.  Psychiatric: She has a normal mood and affect. Her behavior is normal. Judgment and thought content normal.    BP 132/38 mmHg  Pulse 96  Temp(Src) 97.7 F (36.5 C) (Oral)  Resp 16  Wt 178 lb (80.74 kg)     Assessment & Plan:  1. Cramps of lower extremity, unspecified laterality New problem.Unclear etiology. No acute changes. Will check labs.   - CBC with  Differential/Platelet - Comprehensive Metabolic Panel (CMET) - Magnesium  2. Headache, unspecified headache type Has seen neurology for this in the past. Will call them back for follow up.    3. Essential hypertension Stable.  Lower number is quite low today. Will monitor. Check labs as above. May need BP change if running low. Will also stop Bactrim as she thinks she may be having a reaction to it.     Margarita Rana, MD

## 2015-10-25 ENCOUNTER — Telehealth: Payer: Self-pay

## 2015-10-25 LAB — COMPREHENSIVE METABOLIC PANEL
ALBUMIN: 4.2 g/dL (ref 3.5–4.7)
ALK PHOS: 79 IU/L (ref 39–117)
ALT: 44 IU/L — ABNORMAL HIGH (ref 0–32)
AST: 42 IU/L — ABNORMAL HIGH (ref 0–40)
Albumin/Globulin Ratio: 1.5 (ref 1.1–2.5)
BUN / CREAT RATIO: 16 (ref 11–26)
BUN: 20 mg/dL (ref 8–27)
CHLORIDE: 98 mmol/L (ref 97–106)
CO2: 25 mmol/L (ref 18–29)
Calcium: 9.7 mg/dL (ref 8.7–10.3)
Creatinine, Ser: 1.22 mg/dL — ABNORMAL HIGH (ref 0.57–1.00)
GFR calc Af Amer: 48 mL/min/{1.73_m2} — ABNORMAL LOW (ref >59)
GFR calc non Af Amer: 42 mL/min/{1.73_m2} — ABNORMAL LOW (ref >59)
GLOBULIN, TOTAL: 2.8 g/dL (ref 1.5–4.5)
Glucose: 95 mg/dL (ref 65–99)
POTASSIUM: 4.5 mmol/L (ref 3.5–5.2)
SODIUM: 137 mmol/L (ref 136–144)
Total Protein: 7 g/dL (ref 6.0–8.5)

## 2015-10-25 LAB — CBC WITH DIFFERENTIAL/PLATELET
BASOS ABS: 0 10*3/uL (ref 0.0–0.2)
Basos: 1 %
EOS (ABSOLUTE): 0.2 10*3/uL (ref 0.0–0.4)
EOS: 3 %
HEMATOCRIT: 38.7 % (ref 34.0–46.6)
HEMOGLOBIN: 13.1 g/dL (ref 11.1–15.9)
Immature Grans (Abs): 0 10*3/uL (ref 0.0–0.1)
Immature Granulocytes: 0 %
LYMPHS ABS: 1.8 10*3/uL (ref 0.7–3.1)
Lymphs: 21 %
MCH: 29.5 pg (ref 26.6–33.0)
MCHC: 33.9 g/dL (ref 31.5–35.7)
MCV: 87 fL (ref 79–97)
MONOCYTES: 11 %
MONOS ABS: 0.9 10*3/uL (ref 0.1–0.9)
NEUTROS ABS: 5.3 10*3/uL (ref 1.4–7.0)
Neutrophils: 64 %
Platelets: 314 10*3/uL (ref 150–379)
RBC: 4.44 x10E6/uL (ref 3.77–5.28)
RDW: 14.7 % (ref 12.3–15.4)
WBC: 8.3 10*3/uL (ref 3.4–10.8)

## 2015-10-25 LAB — MAGNESIUM: MAGNESIUM: 2.1 mg/dL (ref 1.6–2.3)

## 2015-10-25 NOTE — Telephone Encounter (Signed)
-----   Message from Margarita Rana, MD sent at 10/25/2015  1:23 PM EST ----- Labs stable except for very  Mildly elevated liver enzymes and decreased kidney function. Often improves on it's own.   Please see how patient is feeling.  Thanks.

## 2015-10-25 NOTE — Telephone Encounter (Signed)
Advised pt of lab results. Pt verbally acknowledges understanding. Pt states she feels about the same, and is monitoring BP. Renaldo Fiddler, CMA

## 2015-10-26 ENCOUNTER — Telehealth: Payer: Self-pay | Admitting: Family Medicine

## 2015-10-26 NOTE — Telephone Encounter (Addendum)
Pt states she was in on Wednesday and Dr Venia Minks advised pt to call back today to give an update.  Pt states today her blood pressure is 155/50 and yesterday it was 166/59.    Pt states she still has a headache.  [id number]

## 2015-10-26 NOTE — Telephone Encounter (Signed)
Please review. Thanks!  

## 2015-10-26 NOTE — Telephone Encounter (Signed)
Please check how patient is doing and see if she called and scheduled with her neurologist.. Thanks.

## 2015-10-27 NOTE — Telephone Encounter (Signed)
Spoke with patient she states that b/p last night before bed was 155/50, she reports feeling fine other wise but she reports having frequent headaches. Patient wants to know is she needs to come in and discuss with you about changing blood pressure medication? She states that she did not know that her blood pressure had been elevated. Encouraged patient to keep record of blood pressure readings. Patient states that she did call neurology office Thursday and is awaiting a call back from there office. Patient would like a call back from Dr. Venia Minks or her nurse on Monday to clarify if blood pressure medication needs to be changed. KW

## 2015-10-28 DIAGNOSIS — R519 Headache, unspecified: Secondary | ICD-10-CM | POA: Insufficient documentation

## 2015-10-28 DIAGNOSIS — R51 Headache: Secondary | ICD-10-CM

## 2015-10-29 NOTE — Telephone Encounter (Signed)
Patient reports that she is still having headaches and dizziness. She reports that she scheduled an appt with Dr. Manuella Ghazi on Thursday (11/01/15) to discuss symptoms. She reports that she has been taking her BP and it goes as follows (yesterday):  123/47- around 8am 145/48- around 12n 166/52- around 7pm   Advised patient that you recommended that we hold off on adjusting her BP meds due to diastolic number being so low. Patient reports that she will continue to monitor and will call if BP continues to be out of range.

## 2015-10-29 NOTE — Telephone Encounter (Signed)
Please check on patient. Can not adjust BP meds as bottom number is low.  Think elevated systolic related to headache. See when she has appointment and how her head pain is. Thanks.

## 2015-11-01 DIAGNOSIS — M5481 Occipital neuralgia: Secondary | ICD-10-CM | POA: Diagnosis not present

## 2015-11-14 ENCOUNTER — Encounter: Payer: Self-pay | Admitting: Physical Therapy

## 2015-11-14 ENCOUNTER — Ambulatory Visit: Payer: Medicare Other | Attending: Neurology

## 2015-11-14 VITALS — BP 127/97 | HR 81

## 2015-11-14 DIAGNOSIS — R262 Difficulty in walking, not elsewhere classified: Secondary | ICD-10-CM | POA: Insufficient documentation

## 2015-11-14 DIAGNOSIS — R531 Weakness: Secondary | ICD-10-CM

## 2015-11-14 NOTE — Therapy (Signed)
[place] MAIN [facility] SERVICES [address] [place], [place], [address] Phone: [phone]   Fax:  [phone]  Physical Therapy Evaluation  Patient Details  Name: Helen Ramsey MRN: [id number] Date of Birth: 05/23/1933 Referring Provider: Jennings Books  Encounter Date: 11/14/2015      PT End of Session - 11/14/15 0927    Visit Number 1   Number of Visits 17   Date for PT Re-Evaluation 01/09/16   PT Start Time 0910   PT Stop Time 1010   PT Time Calculation (min) 60 min   Equipment Utilized During Treatment Gait belt   Activity Tolerance Patient tolerated treatment well   Behavior During Therapy [facility] for tasks assessed/performed      Past Medical History  Diagnosis Date  . Allergic rhinitis, cause unspecified   . Urinary frequency   . Dizziness and giddiness   . Atherosclerosis of aorta ([place])   . Unspecified essential hypertension   . Other and unspecified hyperlipidemia   . Degeneration of lumbar or lumbosacral intervertebral disc   . Unspecified sleep apnea   . Allergic rhinitis, cause unspecified   . Headache(784.0)   . Dysthymic disorder   . Myalgia and myositis, unspecified   . Osteoporosis, unspecified   . Trigger finger (acquired)   . Other specified disorders of bladder   . Diverticulitis of colon (without mention of hemorrhage)   . Other diseases of nasal cavity and sinuses(478.19)     Past Surgical History  Procedure Laterality Date  . Carpal tunnel release    . Tonsillectomy    . Abdominal hysterectomy    . Temporal artery biopsy / ligation    . Adenoidectomy    . R/o lymph nodes; right axilla    . Arthroscopic knee surgery    . Cardiac catheterization      [facility]  . Appendectomy    . Cholecystectomy      Filed Vitals:   11/14/15 0928  BP: 127/97  Pulse: 81  SpO2: 98%    Visit Diagnosis:  Difficulty walking - Plan: PT plan of care cert/re-cert  Generalized weakness - Plan: PT plan of care  cert/re-cert      Subjective Assessment - 11/14/15 1137    Subjective Dizziness and imbalance   Pertinent History Pt reports intermittent dizziness for multiple months, worsening. She reports that she always has a headache whenever she feels dizzy. She has never been formally diagnosed with migraines. Pt reports prior episodes but is unable to recall whether this is similar or not. Pt went to Dr. Virgia Land (12/1) first and he put her on an antibiotic due to a slight sinus infection. She was told to go to see Dr. Manuella Ghazi but couldn't get in for a while so went to see her PCP, Dr. Venia Minks who took her of the Bactrim due to headaches and low energy. Pt went on to see Dr. Manuella Ghazi on 11/01/15 and she had an occipital nerve block on both sides. Dr. Manuella Ghazi reported that she may be starting to have migraines and has told her she has occipital and cervical headaches. Pt reports that the dizziness and headaches improved after the occipital nerve block. Pt reports that the symptoms of headache and dizziness started back again last week. Pt denies vertigo. She describes symptoms as "fear of falling." She feels like she is going to stumble. Pt had therapy previously with improvement in symptoms. She believes that the therapy focused on her neck pain. She also complains  of "weakness" and decreased energy.   Currently in Pain? No/denies        VESTIBULAR AND BALANCE EVALUATION  HISTORY:  Description of dizziness: (vertigo, unsteadiness, lightheadedness, falling, general unsteadiness, aural fullness): unsteadiness, falling Frequency: Daily to every other day Duration: "an hour or so." Symptom nature: (motion provoked/positional/spontaneous/constant, variable, intermittent): spontaneous with headaches   Provocative Factors: looking down otherwise unknown.  Easing Factors: sitting still and resting, tramadol  Progression of symptoms: (better, worse, no change since onset): worsening  History of similar episodes: Yes in  the past  Falls (yes/no): No Number of falls in past 6 months: No  Prior Functional Level: Independent  Auditory complaints (tinnitus, pain, drainage): Denies Vision (last eye exam, diplopia, recent changes): Denies  Current Symptoms: (vertigo, vomiting, dysarthria, dysphagia, drop attacks, bowel and bladder changes, recent weight loss/gain, lightheadedness, headache, rocking, general unsteadiness, imbalance, tilting, swaying, veering, dizzy, oscillopsia, migraines): Nausea, headaches, imbalance, otherwise negative  EXAMINATION   NEUROLOGICAL SCREEN: (2+ unless otherwise noted.) N=normal  Ab=abnormal  Level Dermatome R L Myotome R L Reflex R L  C3 Anterior Neck N N Sidebend C2-3 N N Jaw CN V    C4 Top of Shoulder N N Shoulder Shrug C4 N N Hoffman's UMN    C5 Lateral Upper Arm N N Shoulder ABD C4-5 N N Biceps C5-6    C6 Lateral Arm/ Thumb A N Arm Flex/ Wrist Ext C5-6 N N Brachiorad. C5-6    C7 Middle Finger N N Arm Ext//Wrist Flex C6-7 N N Triceps C7    C8 4th & 5th Finger N N Flex/ Ext Carpi Ulnaris C8   Patella    T1 Medial Arm N N Interossei T1   Gastrocnemius    L2 Medial thigh/groin N N Illiopsoas (L2-3) N N     L3 Lower thigh/med.knee N N Quadriceps (L3-4) N N Patellar (L3-4)    L4 Medial leg/lat thigh N N Tibialis Ant (L4-5) N N     L5 Lat. leg & dorsal foot A N EHL (L5)       S1 post/lat foot/thigh/leg A N Gastrocnemius (S1-2) N N Gastrocnemius (S1)    S2 Post./med. thigh & leg N N Hamstrings (L4-S3) N N Babinski     Deferred reflex testing  SOMATOSENSORY:         Sensation           Intact      Diminished         Absent  Light touch Abnormal    Any N & T in extremities or weakness:       MUSCULOSKELETAL SCREEN: Cervical Spine ROM: Painful with OP at end range in all planes. Increasingly tender to CPA C2-C7. No reported dizziness   ROM: No gross deficits identified  MMT:  Grossly WFL, 4+/5 throughout without focal weakness  Functional Mobility:  Gait:No  assistive device required for mobility. Gait speed is decreased but functional for full household mobility Scanning of visual environment with gait is: limited but adequate  POSTURAL CONTROL TESTS:   Clinical Test of Sensory Interaction for Balance    (CTSIB):  CONDITION TIME STRATEGY SWAY  Eyes open, firm surface 30  1+  Eyes closed, firm surface 30  2+  Eyes open, foam surface 30  2+  Eyes closed, foam surface 5.25 seconds  3+    OCULOMOTOR / VESTIBULAR TESTING:  Oculomotor Exam- Room Light  Normal Abnormal Comments  Ocular Alignment N    Ocular ROM N  Spontaneous Nystagmus N    End-Gaze Nystagmus N    Smooth Pursuit  A Dizziness reported  Saccades N    VOR  A Pt reports dizziness  VOR Cancellation  A Pt reports dizziness  Left Head Thrust   Deferred, unable to tolerate due to neck pain  Right Head Thrust     Head Shaking Nystagmus     Static Acuity     Dynamic Acuity        FUNCTIONAL OUTCOME MEASURES:  Results Comments  DHI    ABC Scale    DGI 18/24 Increased fall risk  10 meter Walking Speed 11.52 seconds=0.87 m/s Full household, limited community ambulator                     [facility] PT Assessment - 11/14/15 0001    Assessment   Medical Diagnosis Imbalance   Referring Provider Manuella Ghazi, [place]   Onset Date/Surgical Date 09/14/15   Hand Dominance Right   Next MD Visit 12/03/15 Dr. Manuella Ghazi   Prior Therapy Yes   Precautions   Precautions Fall   Restrictions   Weight Bearing Restrictions No   Balance Screen   Has the patient fallen in the past 6 months No   Has the patient had a decrease in activity level because of a fear of falling?  Yes   Is the patient reluctant to leave their home because of a fear of falling?  No   Home Environment   Living Environment Private residence   Living Arrangements Alone   Available Help at Discharge Family   Type of [place] Access Level entry   Home Layout One level   Prior Function   Level of  [place]   Overall Cognitive Status Within Functional Limits for tasks assessed   Observation/Other Assessments   Observations Overweight, rounded shoulders and mild thoracic kyphosis   Standardized Balance Assessment   Standardized Balance Assessment Dynamic Gait Index   Dynamic Gait Index   Level Surface Mild Impairment   Change in Gait Speed Mild Impairment   Gait with Horizontal Head Turns Mild Impairment   Gait with Vertical Head Turns Mild Impairment   Gait and Pivot Turn Normal   Step Over Obstacle Mild Impairment   Step Around Obstacles Normal   Steps Mild Impairment   Total Score 18                           PT Education - 11/14/15 1139    Education provided Yes   Education Details Plan of care, no HEP issued due to time constraints   Person(s) Educated Patient             PT Long Term Goals - 11/14/15 1201    PT LONG TERM GOAL #1   Title Pt will be independent with HEP in order to manage imbalance and decrease risk for falls   Time 8   Period Weeks   Status New   PT LONG TERM GOAL #2   Title Pt will improve gait speed by at least 0.13 m/s to demonstrate improved household and community mobility.    Baseline 11/14/15: 0.87 m/s   Time 8   Period Weeks   Status New   PT LONG TERM GOAL #3   Title Pt will improve DGI by at least 3 points in order to demonstrate decreased risk for falls    Baseline  Dec 02, 2015: 18/24   Time 8   Period Weeks   Status New               Plan - 12/02/2015 1142    Clinical Impression Statement Pt is a pleasant 81 year-old Caucasian female who was referred for imbalance. Unfortunately she arrived late for her appointment without paperwork so evaluation is mildly limited today. PT evaluation reveals decreased gait speed and balance with increased fall risk inidicated with DGI score of 18/24. She has impaired balance with loss of balance in narrow stance with eyes closed on foam in 5  seconds. Pt with multiple comorbid conditions and she reports intermittent dizziness which appear to coincide with her headaches. Pt will benefit from skilled PT services to address deficits in strength and balance in order to reduce fall risk and improve function at home.    Pt will benefit from skilled therapeutic intervention in order to improve on the following deficits Abnormal gait   Rehab Potential Fair   Clinical Impairments Affecting Rehab Potential Positive: motivation, Negative: comorbid conditions   PT Frequency 2x / week   PT Duration 8 weeks   PT Treatment/Interventions ADLs/Self Care Home Management;Canalith Repostioning;DME Instruction;Gait training;Stair training;Functional mobility training;Therapeutic activities;Therapeutic exercise;Balance training;Neuromuscular re-education;Patient/family education;Manual techniques   PT Next Visit Plan BERG or miniBest, 6MWT, TUG, 5TSTS, issue HEP   PT Home Exercise Plan None issued due to time constraints   Consulted and Agree with Plan of Care Patient          G-Codes - 12-02-15 1529    Functional Assessment Tool Used clinical judgement, 105m gait speed, DGI   Functional Limitation Mobility: Walking and moving around   Mobility: Walking and Moving Around Current Status [phone]) At least 20 percent but less than 40 percent impaired, limited or restricted   Mobility: Walking and Moving Around Goal Status [phone]) At least 20 percent but less than 40 percent impaired, limited or restricted       Problem List Patient Active Problem List   Diagnosis Date Noted  . Headache 10/28/2015  . Leg cramps 10/24/2015  . Knee pain, bilateral 08/29/2015  . Sleep apnea 05/30/2015  . Adenomatous colon polyp 05/09/2015  . HLD (hyperlipidemia) 05/09/2015  . Restless leg 05/09/2015  . Abnormal magnetic resonance imaging study 03/28/2015  . Allergic rhinitis 03/28/2015  . Absolute anemia 03/28/2015  . Anxiety and depression 03/28/2015  . Arthritis  03/28/2015  . Cervical pain 03/28/2015  . Chronic headache 03/28/2015  . History of colonic polyps 03/28/2015  . H/O: osteoarthritis 03/28/2015  . Clinical depression 03/28/2015  . Diverticulosis of colon 03/28/2015  . DDD (degenerative disc disease), lumbar 03/28/2015  . Acid reflux 03/28/2015  . 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 Essential hypertension 03/28/2015  . Adaptive colitis 03/28/2015  . Narrowing of intervertebral disc space 03/28/2015  . OP (osteoporosis) 03/28/2015  . Nasal septal perforation 03/28/2015  . Recurrent sinus infections 03/28/2015  . Spinal stenosis 03/28/2015  . Gastric ulcer 03/28/2015  . Cerebral artery occlusion with cerebral infarction ([place]) 03/28/2015  . Acquired trigger finger 03/28/2015  . Avitaminosis D 03/28/2015  . Aortic atherosclerosis ([place]) 07/13/2014  . Leg pain 07/13/2014  . H/O adenomatous polyp of colon 06/27/2005   Phillips Grout PT, DPT   Huprich,Jason 02-Dec-2015, 3:37 PM  [place] MAIN [facility] SERVICES [address] [place], [place], [address] Phone: [phone]   Fax:  [phone]  Name: Helen Ramsey MRN: [id number] Date of Birth:  04/27/1933   

## 2015-11-21 ENCOUNTER — Ambulatory Visit: Payer: Medicare Other | Attending: Neurology | Admitting: Physical Therapy

## 2015-11-21 ENCOUNTER — Encounter: Payer: Self-pay | Admitting: Physical Therapy

## 2015-11-21 DIAGNOSIS — R531 Weakness: Secondary | ICD-10-CM | POA: Diagnosis not present

## 2015-11-21 DIAGNOSIS — R262 Difficulty in walking, not elsewhere classified: Secondary | ICD-10-CM | POA: Diagnosis not present

## 2015-11-21 NOTE — Therapy (Signed)
[place] MAIN [facility] SERVICES [address] [place], [place], [address] Phone: [phone]   Fax:  [phone]  Physical Therapy Treatment  Patient Details  Name: Helen Ramsey MRN: [id number] Date of Birth: 1933-09-11 Referring Provider: Jennings Books  Encounter Date: 11/21/2015      PT End of Session - 11/21/15 1158    Visit Number 2   Number of Visits 17   Date for PT Re-Evaluation 01/09/16   PT Start Time 1145   PT Stop Time 1225   PT Time Calculation (min) 40 min   Equipment Utilized During Treatment Gait belt   Activity Tolerance Patient tolerated treatment well   Behavior During Therapy [facility] for tasks assessed/performed      Past Medical History  Diagnosis Date  . Allergic rhinitis, cause unspecified   . Urinary frequency   . Dizziness and giddiness   . Atherosclerosis of aorta ([place])   . Unspecified essential hypertension   . Other and unspecified hyperlipidemia   . Degeneration of lumbar or lumbosacral intervertebral disc   . Unspecified sleep apnea   . Allergic rhinitis, cause unspecified   . Headache(784.0)   . Dysthymic disorder   . Myalgia and myositis, unspecified   . Osteoporosis, unspecified   . Trigger finger (acquired)   . Other specified disorders of bladder   . Diverticulitis of colon (without mention of hemorrhage)   . Other diseases of nasal cavity and sinuses(478.19)     Past Surgical History  Procedure Laterality Date  . Carpal tunnel release    . Tonsillectomy    . Abdominal hysterectomy    . Temporal artery biopsy / ligation    . Adenoidectomy    . R/o lymph nodes; right axilla    . Arthroscopic knee surgery    . Cardiac catheterization      [facility]  . Appendectomy    . Cholecystectomy      There were no vitals filed for this visit.  Visit Diagnosis:  Difficulty walking  Generalized weakness      Subjective Assessment - 11/21/15 1152    Subjective Patient has head ache, low back pain  and knee pain. She has a headache everyday.    Pertinent History Pt reports intermittent dizziness for multiple months, worsening. She reports that she always has a headache whenever she feels dizzy. She has never been formally diagnosed with migraines. Pt reports prior episodes but is unable to recall whether this is similar or not. Pt went to Dr. Virgia Land (12/1) first and he put her on an antibiotic due to a slight sinus infection. She was told to go to see Dr. Manuella Ghazi but couldn't get in for a while so went to see her PCP, Dr. Venia Minks who took her of the Bactrim due to headaches and low energy. Pt went on to see Dr. Manuella Ghazi on 11/01/15 and she had an occipital nerve block on both sides. Dr. Manuella Ghazi reported that she may be starting to have migraines and has told her she has occipital and cervical headaches. Pt reports that the dizziness and headaches improved after the occipital nerve block. Pt reports that the symptoms of headache and dizziness started back again last week. Pt denies vertigo. She describes symptoms as "fear of falling." She feels like she is going to stumble. Pt had therapy previously with improvement in symptoms. She believes that the therapy focused on her neck pain. She also complains of "weakness" and decreased energy.   Currently in Pain?  Yes   Pain Score 5    Pain Location Knee   Pain Orientation Right;Left      THER-EX Standing exercises YTB : 4 way BLE SLR 2 x 10; Abduction 2 x 10; Extension 2 x 10; Knee flexion 2 x 10; Heel raises 2 x 10;  Resisted side-steeping YTB 4 lengths x 2; Standing mini squats 2 x 10 with RTB around knees to encourage abduction; Sit to stand with UE support 2 x 10; Step-ups to 6" step x 10 bilateral; Quantum leg press 105# x 10, 120# x 10;  NEUROMUSCULAR RE-EDUCATION Airex NBOS eyes open/closed x 30 seconds each; Airex NBOS eyes open horizontal and vertical head turns x 30 seconds; Airex cone taps alternating LE x 60 seconds; Tandem gait in //  bars x 4 laps  Loss of balance often to the R side.Patient performs beginning level exercises with pain behaviors and needs verbal cuing for postural alignment and head positioning.                           PT Education - 11/21/15 1156    Education provided Yes   Education Details HEP   Person(s) Educated Patient   Methods Explanation   Comprehension Verbalized understanding             PT Long Term Goals - 11/14/15 1201    PT LONG TERM GOAL #1   Title Pt will be independent with HEP in order to manage imbalance and decrease risk for falls   Time 8   Period Weeks   Status New   PT LONG TERM GOAL #2   Title Pt will improve gait speed by at least 0.13 m/s to demonstrate improved household and community mobility.    Baseline 11/14/15: 0.87 m/s   Time 8   Period Weeks   Status New   PT LONG TERM GOAL #3   Title Pt will improve DGI by at least 3 points in order to demonstrate decreased risk for falls    Baseline 11/14/15: 18/24   Time 8   Period Weeks   Status New               Plan - 11/21/15 1158    Clinical Impression Statement Patients pain in her low back and knees limits her ability to participate fully in therapy. She is able to perform  basic beginning LE exercises and basic beginning standing balance training that requires min assist and cuing for posture.    Pt will benefit from skilled therapeutic intervention in order to improve on the following deficits Abnormal gait;Decreased activity tolerance;Decreased mobility;Decreased balance;Pain;Obesity;Difficulty walking;Decreased strength   Rehab Potential Fair   Clinical Impairments Affecting Rehab Potential Positive: motivation, Negative: comorbid conditions   PT Frequency 2x / week   PT Duration 8 weeks   PT Treatment/Interventions ADLs/Self Care Home Management;Canalith Repostioning;DME Instruction;Gait training;Stair training;Functional mobility training;Therapeutic  activities;Therapeutic exercise;Balance training;Neuromuscular re-education;Patient/family education;Manual techniques   PT Next Visit Plan BERG or miniBest, 6MWT, TUG, 5TSTS, issue HEP   PT Home Exercise Plan None issued due to time constraints   Consulted and Agree with Plan of Care Patient        Problem List Patient Active Problem List   Diagnosis Date Noted  . Headache 10/28/2015  . Leg cramps 10/24/2015  . Knee pain, bilateral 08/29/2015  . Sleep apnea 05/30/2015  . Adenomatous colon polyp 05/09/2015  . HLD (hyperlipidemia) 05/09/2015  . Restless leg 05/09/2015  .  Abnormal magnetic resonance imaging study 03/28/2015  . Allergic rhinitis 03/28/2015  . Absolute anemia 03/28/2015  . Anxiety and depression 03/28/2015  . Arthritis 03/28/2015  . Cervical pain 03/28/2015  . Chronic headache 03/28/2015  . History of colonic polyps 03/28/2015  . H/O: osteoarthritis 03/28/2015  . Clinical depression 03/28/2015  . Diverticulosis of colon 03/28/2015  . DDD (degenerative disc disease), lumbar 03/28/2015  . Acid reflux 03/28/2015  . 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 Essential hypertension 03/28/2015  . Adaptive colitis 03/28/2015  . Narrowing of intervertebral disc space 03/28/2015  . OP (osteoporosis) 03/28/2015  . Nasal septal perforation 03/28/2015  . Recurrent sinus infections 03/28/2015  . Spinal stenosis 03/28/2015  . Gastric ulcer 03/28/2015  . Cerebral artery occlusion with cerebral infarction ([place]) 03/28/2015  . Acquired trigger finger 03/28/2015  . Avitaminosis D 03/28/2015  . Aortic atherosclerosis ([place]) 07/13/2014  . Leg pain 07/13/2014  . H/O adenomatous polyp of colon 06/27/2005    Alanson Puls 11/21/2015, 12:02 PM  [place] MAIN [facility] SERVICES [address] [place], [place], [address] Phone: [phone]   Fax:  [phone]  Name: Helen Ramsey MRN: [id number] Date of Birth:  1933-07-29

## 2015-11-26 ENCOUNTER — Ambulatory Visit: Payer: Medicare Other | Admitting: Physical Therapy

## 2015-11-28 ENCOUNTER — Ambulatory Visit: Payer: Medicare Other | Admitting: Physical Therapy

## 2015-11-29 ENCOUNTER — Encounter: Payer: Self-pay | Admitting: Family Medicine

## 2015-11-29 ENCOUNTER — Ambulatory Visit (INDEPENDENT_AMBULATORY_CARE_PROVIDER_SITE_OTHER): Payer: Medicare Other | Admitting: Family Medicine

## 2015-11-29 VITALS — BP 116/64 | HR 68 | Temp 98.1°F | Resp 16 | Ht 60.0 in | Wt 185.0 lb

## 2015-11-29 DIAGNOSIS — Z91018 Allergy to other foods: Secondary | ICD-10-CM | POA: Insufficient documentation

## 2015-11-29 DIAGNOSIS — F329 Major depressive disorder, single episode, unspecified: Secondary | ICD-10-CM

## 2015-11-29 DIAGNOSIS — E559 Vitamin D deficiency, unspecified: Secondary | ICD-10-CM

## 2015-11-29 DIAGNOSIS — J309 Allergic rhinitis, unspecified: Secondary | ICD-10-CM | POA: Diagnosis not present

## 2015-11-29 DIAGNOSIS — R519 Headache, unspecified: Secondary | ICD-10-CM

## 2015-11-29 DIAGNOSIS — I10 Essential (primary) hypertension: Secondary | ICD-10-CM | POA: Diagnosis not present

## 2015-11-29 DIAGNOSIS — F419 Anxiety disorder, unspecified: Secondary | ICD-10-CM

## 2015-11-29 DIAGNOSIS — F32A Depression, unspecified: Secondary | ICD-10-CM

## 2015-11-29 DIAGNOSIS — F418 Other specified anxiety disorders: Secondary | ICD-10-CM

## 2015-11-29 DIAGNOSIS — R51 Headache: Secondary | ICD-10-CM | POA: Diagnosis not present

## 2015-11-29 MED ORDER — VITAMIN D 1000 UNITS PO TABS: 1000.0000 [IU] | ORAL_TABLET | Freq: Every day | ORAL | Status: DC

## 2015-11-29 MED ORDER — CALCIUM CARBONATE-VITAMIN D 600-200 MG-UNIT PO CAPS: 1.0000 | ORAL_CAPSULE | Freq: Two times a day (BID) | ORAL | Status: DC

## 2015-11-29 NOTE — Progress Notes (Signed)
Patient ID: Helen Ramsey, female   DOB: 1932-12-28, 82 y.o.   MRN: [id number]       Patient: Helen Ramsey Female    DOB: November 20, 1932   82 y.o.   MRN: [id number] Visit Date: 11/29/2015  Today's Provider: Margarita Rana, MD   Chief Complaint  Patient presents with  . Hypertension  . Hyperlipidemia   Subjective:    HPI  Hypertension, follow-up:  BP Readings from Last 3 Encounters:  11/29/15 116/64  11/14/15 127/97  10/24/15 132/38   She was last seen for hypertension 1 months ago.  BP at that visit was 132/38. Management changes since that visit include none. She reports excellent compliance with treatment. She is not having side effects. She is exercising. She is adherent to low salt diet.   Outside blood pressures are stable. She is experiencing dyspnea.  Patient denies chest pain.   Cardiovascular risk factors include advanced age (older than 62 for men, 35 for women) and obesity (BMI >= 30 kg/m2).  Use of agents associated with hypertension: none.    Weight trend: increasing steadily Wt Readings from Last 3 Encounters:  11/29/15 185 lb (83.915 kg)  10/24/15 178 lb (80.74 kg)  08/29/15 177 lb (80.287 kg)   Current diet: in general, a "healthy" diet    ------------------------------------------------------------------------   Depression, Follow-up  She  was last seen for this 3 months ago. Changes made at last visit include none.   She reports excellent compliance with treatment. She is not having side effects.   She reports fair tolerance of treatment. Current symptoms include: depressed mood, difficulty concentrating, feelings of worthlessness/guilt and insomnia She feels she is Worse since last visit. Patient reports she has been having more crying spells than in the past.   Follow up for headache  The patient was last seen for this 1 months ago. Changes made at last visit include none.  She reports good compliance with treatment. She feels  that condition is Unchanged. She is not having side effects. Patient reports she has been taking Tramadol at night for headache and joint pain. Patient reports mild improvement. Patient reports she has been seen by neurology. Patient reports she had nerve block to help with headaches. Patient reports nerve blocked helped with headache for several days.  ------------------------------------------------------------------------------------     Allergies  Allergen Reactions  . Amoxicillin   . Clindamycin/Lincomycin   . Doxycycline   . Levaquin [Levofloxacin In D5w]   . Mirabegron Nausea And Vomiting  . Nsaids Other (See Comments)    GI upset  . Phenergan [Promethazine Hcl]   . Singulair [Montelukast]   . Vicodin [Hydrocodone-Acetaminophen]   . Zocor [Simvastatin]   . Zoloft  [Sertraline] Other (See Comments)  . Ampicillin Rash  . Penicillins Rash    Ampicillin, Clindamycin, Doxycycline Levaquin-severe headache, muscle and joint aches, weak   Previous Medications   ACETAMINOPHEN (TYLENOL) 500 MG TABLET    Take 500 mg by mouth 3 (three) times daily as needed.   ALENDRONATE (FOSAMAX) 70 MG TABLET    Take 70 mg by mouth once a week.    ASPIRIN 81 MG TABLET    Take 81 mg by mouth daily.   ATORVASTATIN (LIPITOR) 20 MG TABLET    Take 1 tablet (20 mg total) by mouth daily.   AZELASTINE (ASTELIN) 0.1 % NASAL SPRAY    Place 1 spray into both nostrils as needed for rhinitis. Use in each nostril as directed   CALCIUM CARBONATE-VITAMIN D 600-200  MG-UNIT CAPS    Take by mouth 2 (two) times daily.   CETIRIZINE (ZYRTEC) 10 MG TABLET    Take 10 mg by mouth as needed.    CHOLECALCIFEROL (VITAMIN D) 1000 UNITS TABLET    Take 1,000 Units by mouth daily.   DOCUSATE SODIUM (COLACE) 100 MG CAPSULE    Take 100 mg by mouth as needed for mild constipation.   DULOXETINE (CYMBALTA) 30 MG CAPSULE    TAKE ONE (1) CAPSULE EACH DAY   FLUTICASONE (FLONASE) 50 MCG/ACT NASAL SPRAY    Place 2 sprays into both nostrils  daily.   GABAPENTIN (NEURONTIN) 300 MG CAPSULE    Take 1 capsule (300 mg total) by mouth 2 (two) times daily.   LISINOPRIL-HYDROCHLOROTHIAZIDE (PRINZIDE,ZESTORETIC) 20-25 MG PER TABLET    Take 1 tablet by mouth daily.   MAGNESIUM GLUCONATE PO    Take 400 mg by mouth daily.   OMEGA-3 FATTY ACIDS (FISH OIL) 1200 MG CAPS    Take 2 capsules by mouth daily.    OMEPRAZOLE (PRILOSEC) 20 MG CAPSULE    Take 1 capsule (20 mg total) by mouth daily.   POTASSIUM GLUCONATE 595 MG CAPS    Take 1 capsule by mouth daily.    RANITIDINE (ZANTAC) 150 MG TABLET    Take 150 mg by mouth as needed for heartburn.   SODIUM CHLORIDE (OCEAN) 0.65 % NASAL SPRAY    1 spray by Nasal route once daily.   TRAMADOL (ULTRAM) 50 MG TABLET    Take 1 tablet (50 mg total) by mouth 2 (two) times daily as needed.    Review of Systems  Constitutional: Positive for activity change and fatigue.  Cardiovascular: Negative.   Musculoskeletal: Positive for arthralgias.  Neurological: Positive for headaches.  Psychiatric/Behavioral: Positive for sleep disturbance and decreased concentration. The patient is nervous/anxious.     Social History  Substance Use Topics  . Smoking status: Never Smoker   . Smokeless tobacco: Never Used  . Alcohol Use: No   Objective:   BP 116/64 mmHg  Pulse 68  Temp(Src) 98.1 F (36.7 C) (Oral)  Resp 16  Ht 5' (1.524 m)  Wt 185 lb (83.915 kg)  BMI 36.13 kg/m2  SpO2 96%  Physical Exam  Constitutional: She is oriented to person, place, and time. She appears well-developed and well-nourished.  Cardiovascular: Normal rate and regular rhythm.   Pulmonary/Chest: Effort normal and breath sounds normal.  Neurological: She is alert and oriented to person, place, and time.  Psychiatric: She has a normal mood and affect. Her behavior is normal. Judgment and thought content normal.      Assessment & Plan:     1. Essential hypertension Improved with hydration.  Continue current medication and plan of  care.   2. Clinical depression Not at goal secondary to headache and just not feeling well. Does not really want to go back to psychiatry, but will go if does not start feeling better.   3. Headache, unspecified headache type Follow up with neurologist.    4. Vitamin D deficiency Continue current medication and plan of care.   - cholecalciferol (VITAMIN D) 1000 units tablet; Take 1 tablet (1,000 Units total) by mouth daily.  Dispense: 1 tablet; Refill: 0 - Calcium Carbonate-Vitamin D 600-200 MG-UNIT CAPS; Take 1 capsule by mouth 2 (two) times daily.  Dispense: 1 each; Refill: 0  5. Allergic rhinitis, unspecified allergic rhinitis type Restart Zyrtec daily   6. Multiple food allergies Wondering if this could be  cause of headaches. Will refer to evaluate and treat  - Ambulatory referral to ENT  7. Anxiety and depression Worsening. Will refer to psychiatry.   - Ambulatory referral to Psychiatry     Patient was seen and examined by Jerrell Belfast, MD, and note scribed by Lynford Humphrey, [place].   I have reviewed the document for accuracy and completeness and I agree with above. - Jerrell Belfast, MD   Margarita Rana, MD  [facility]

## 2015-11-30 ENCOUNTER — Ambulatory Visit: Payer: Medicare Other | Admitting: Physical Therapy

## 2015-11-30 ENCOUNTER — Encounter: Payer: Self-pay | Admitting: Physical Therapy

## 2015-11-30 DIAGNOSIS — R531 Weakness: Secondary | ICD-10-CM

## 2015-11-30 DIAGNOSIS — R262 Difficulty in walking, not elsewhere classified: Secondary | ICD-10-CM | POA: Diagnosis not present

## 2015-11-30 NOTE — Therapy (Signed)
[place] MAIN [facility] SERVICES [address] [place], [place], [address] Phone: [phone]   Fax:  [phone]  Physical Therapy Treatment  Patient Details  Name: Helen Ramsey MRN: [id number] Date of Birth: 06-27-1933 Referring Provider: Jennings Books  Encounter Date: 11/30/2015      PT End of Session - 11/30/15 1108    Visit Number 3   Number of Visits 17   Date for PT Re-Evaluation 01/09/16   PT Start Time 1105   PT Stop Time 1145   PT Time Calculation (min) 40 min   Equipment Utilized During Treatment Gait belt   Activity Tolerance Patient tolerated treatment well   Behavior During Therapy [facility] for tasks assessed/performed      Past Medical History  Diagnosis Date  . Allergic rhinitis, cause unspecified   . Urinary frequency   . Dizziness and giddiness   . Atherosclerosis of aorta ([place])   . Unspecified essential hypertension   . Other and unspecified hyperlipidemia   . Degeneration of lumbar or lumbosacral intervertebral disc   . Unspecified sleep apnea   . Allergic rhinitis, cause unspecified   . Headache(784.0)   . Dysthymic disorder   . Myalgia and myositis, unspecified   . Osteoporosis, unspecified   . Trigger finger (acquired)   . Other specified disorders of bladder   . Diverticulitis of colon (without mention of hemorrhage)   . Other diseases of nasal cavity and sinuses(478.19)     Past Surgical History  Procedure Laterality Date  . Carpal tunnel release    . Tonsillectomy    . Abdominal hysterectomy    . Temporal artery biopsy / ligation    . Adenoidectomy    . R/o lymph nodes; right axilla    . Arthroscopic knee surgery    . Cardiac catheterization      [facility]  . Appendectomy    . Cholecystectomy      There were no vitals filed for this visit.  Visit Diagnosis:  Difficulty walking  Generalized weakness      Subjective Assessment - 11/30/15 1107    Subjective Patient has head ache, low back pain  and knee pain. She has a headache everyday. She continues to be unsteady on her feet.    Pertinent History Pt reports intermittent dizziness for multiple months, worsening. She reports that she always has a headache whenever she feels dizzy. She has never been formally diagnosed with migraines. Pt reports prior episodes but is unable to recall whether this is similar or not. Pt went to Dr. Virgia Land (12/1) first and he put her on an antibiotic due to a slight sinus infection. She was told to go to see Dr. Manuella Ghazi but couldn't get in for a while so went to see her PCP, Dr. Venia Minks who took her of the Bactrim due to headaches and low energy. Pt went on to see Dr. Manuella Ghazi on 11/01/15 and she had an occipital nerve block on both sides. Dr. Manuella Ghazi reported that she may be starting to have migraines and has told her she has occipital and cervical headaches. Pt reports that the dizziness and headaches improved after the occipital nerve block. Pt reports that the symptoms of headache and dizziness started back again last week. Pt denies vertigo. She describes symptoms as "fear of falling." She feels like she is going to stumble. Pt had therapy previously with improvement in symptoms. She believes that the therapy focused on her neck pain. She also complains of "weakness"  and decreased energy.      THER-EX Standing exercises YTB : 4 way BLE SLR 2 x 10; Abduction 2 x 10; Extension 2 x 10; Knee flexion 2 x 10; Heel raises 2 x 10;  Resisted side-steeping YTB 4 lengths x 2; Standing mini squats 2 x 10 with RTB around knees to encourage abduction; Sit to stand with UE support 2 x 10; Step-ups to 6" step x 10 bilateral; Quantum leg press 105# x 10, 120# x 10;  NEUROMUSCULAR RE-EDUCATION Airex NBOS eyes open/closed x 30 seconds each; Airex NBOS eyes open horizontal and vertical head turns x 30 seconds; Airex cone taps alternating LE x 60 seconds; Tandem gait in // bars x 4 laps Loss of balance often to the R  side.Patient performs beginning level exercises with pain behaviors and needs verbal cuing for postural alignment and head positioning.                                     PT Education - 11/30/15 1108    Education provided Yes   Education Details HEP   Person(s) Educated Patient   Methods Explanation   Comprehension Verbalized understanding             PT Long Term Goals - 11/14/15 1201    PT LONG TERM GOAL #1   Title Pt will be independent with HEP in order to manage imbalance and decrease risk for falls   Time 8   Period Weeks   Status New   PT LONG TERM GOAL #2   Title Pt will improve gait speed by at least 0.13 m/s to demonstrate improved household and community mobility.    Baseline 11/14/15: 0.87 m/s   Time 8   Period Weeks   Status New   PT LONG TERM GOAL #3   Title Pt will improve DGI by at least 3 points in order to demonstrate decreased risk for falls    Baseline 11/14/15: 18/24   Time 8   Period Weeks   Status New               Plan - 11/30/15 1108    Clinical Impression Statement Continues to have balance deficits typical with diagnosis. Patient performs intermediate level exercises without pain behaviors and needs verbal cuing for postural alignment and head positioning.   Pt will benefit from skilled therapeutic intervention in order to improve on the following deficits Abnormal gait;Decreased activity tolerance;Decreased mobility;Decreased balance;Pain;Obesity;Difficulty walking;Decreased strength   Rehab Potential Fair   Clinical Impairments Affecting Rehab Potential Positive: motivation, Negative: comorbid conditions   PT Frequency 2x / week   PT Duration 8 weeks   PT Treatment/Interventions ADLs/Self Care Home Management;Canalith Repostioning;DME Instruction;Gait training;Stair training;Functional mobility training;Therapeutic activities;Therapeutic exercise;Balance training;Neuromuscular re-education;Patient/family  education;Manual techniques   PT Next Visit Plan BERG or miniBest, 6MWT, TUG, 5TSTS, issue HEP   PT Home Exercise Plan None issued due to time constraints   Consulted and Agree with Plan of Care Patient        Problem List Patient Active Problem List   Diagnosis Date Noted  . Vitamin D deficiency 11/29/2015  . Multiple food allergies 11/29/2015  . Headache 10/28/2015  . Leg cramps 10/24/2015  . Knee pain, bilateral 08/29/2015  . Sleep apnea 05/30/2015  . Adenomatous colon polyp 05/09/2015  . HLD (hyperlipidemia) 05/09/2015  . Restless leg 05/09/2015  . Allergic rhinitis 03/28/2015  . Absolute  anemia 03/28/2015  . Anxiety and depression 03/28/2015  . Arthritis 03/28/2015  . Cervical pain 03/28/2015  . Chronic headache 03/28/2015  . History of colonic polyps 03/28/2015  . H/O: osteoarthritis 03/28/2015  . Clinical depression 03/28/2015  . Diverticulosis of colon 03/28/2015  . DDD (degenerative disc disease), lumbar 03/28/2015  . Acid reflux 03/28/2015  . 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 Essential hypertension 03/28/2015  . Adaptive colitis 03/28/2015  . Narrowing of intervertebral disc space 03/28/2015  . Nasal septal perforation 03/28/2015  . Recurrent sinus infections 03/28/2015  . Spinal stenosis 03/28/2015  . Gastric ulcer 03/28/2015  . Cerebral artery occlusion with cerebral infarction ([place]) 03/28/2015  . Aortic atherosclerosis ([place]) 07/13/2014  . H/O adenomatous polyp of colon 06/27/2005    Alanson Puls 11/30/2015, 11:11 AM  [place] MAIN [facility] SERVICES [address] [place], [place], [address] Phone: [phone]   Fax:  [phone]  Name: BROOKLAN DUSEK MRN: [id number] Date of Birth: May 21, 1933

## 2015-12-03 ENCOUNTER — Encounter: Payer: Self-pay | Admitting: Physical Therapy

## 2015-12-03 ENCOUNTER — Ambulatory Visit: Payer: Medicare Other | Admitting: Physical Therapy

## 2015-12-03 DIAGNOSIS — R262 Difficulty in walking, not elsewhere classified: Secondary | ICD-10-CM

## 2015-12-03 DIAGNOSIS — G4733 Obstructive sleep apnea (adult) (pediatric): Secondary | ICD-10-CM | POA: Diagnosis not present

## 2015-12-03 DIAGNOSIS — R2689 Other abnormalities of gait and mobility: Secondary | ICD-10-CM | POA: Diagnosis not present

## 2015-12-03 DIAGNOSIS — R531 Weakness: Secondary | ICD-10-CM | POA: Diagnosis not present

## 2015-12-03 DIAGNOSIS — R4189 Other symptoms and signs involving cognitive functions and awareness: Secondary | ICD-10-CM | POA: Diagnosis not present

## 2015-12-03 DIAGNOSIS — M5481 Occipital neuralgia: Secondary | ICD-10-CM | POA: Diagnosis not present

## 2015-12-03 DIAGNOSIS — F411 Generalized anxiety disorder: Secondary | ICD-10-CM | POA: Diagnosis not present

## 2015-12-03 DIAGNOSIS — E669 Obesity, unspecified: Secondary | ICD-10-CM | POA: Diagnosis not present

## 2015-12-03 NOTE — Therapy (Signed)
[place] MAIN [facility] SERVICES [address] [place], [place], [address] Phone: [phone]   Fax:  [phone]  Physical Therapy Treatment  Patient Details  Name: Helen Ramsey MRN: [id number] Date of Birth: Mar 22, 1933 Referring Provider: Jennings Books  Encounter Date: 12/03/2015      PT End of Session - 12/03/15 1147    Visit Number 4   Number of Visits 17   Date for PT Re-Evaluation January 28, 2016   Authorization Type g codes   PT Start Time [id number]   PT Stop Time 1225   PT Time Calculation (min) 40 min   Equipment Utilized During Treatment Gait belt   Activity Tolerance Patient tolerated treatment well   Behavior During Therapy [facility] for tasks assessed/performed      Past Medical History  Diagnosis Date  . Allergic rhinitis, cause unspecified   . Urinary frequency   . Dizziness and giddiness   . Atherosclerosis of aorta ([place])   . Unspecified essential hypertension   . Other and unspecified hyperlipidemia   . Degeneration of lumbar or lumbosacral intervertebral disc   . Unspecified sleep apnea   . Allergic rhinitis, cause unspecified   . Headache(784.0)   . Dysthymic disorder   . Myalgia and myositis, unspecified   . Osteoporosis, unspecified   . Trigger finger (acquired)   . Other specified disorders of bladder   . Diverticulitis of colon (without mention of hemorrhage)   . Other diseases of nasal cavity and sinuses(478.19)     Past Surgical History  Procedure Laterality Date  . Carpal tunnel release    . Tonsillectomy    . Abdominal hysterectomy    . Temporal artery biopsy / ligation    . Adenoidectomy    . R/o lymph nodes; right axilla    . Arthroscopic knee surgery    . Cardiac catheterization      [facility]  . Appendectomy    . Cholecystectomy      There were no vitals filed for this visit.  Visit Diagnosis:  Difficulty walking  Generalized weakness      Subjective Assessment - 12/03/15 1146    Subjective Patient  says that she is having a tearful day today and is sad about her husbands death.    Pertinent History Pt reports intermittent dizziness for multiple months, worsening. She reports that she always has a headache whenever she feels dizzy. She has never been formally diagnosed with migraines. Pt reports prior episodes but is unable to recall whether this is similar or not. Pt went to Dr. Virgia Land (12/1) first and he put her on an antibiotic due to a slight sinus infection. She was told to go to see Dr. Manuella Ghazi but couldn't get in for a while so went to see her PCP, Dr. Venia Minks who took her of the Bactrim due to headaches and low energy. Pt went on to see Dr. Manuella Ghazi on 11/01/15 and she had an occipital nerve block on both sides. Dr. Manuella Ghazi reported that she may be starting to have migraines and has told her she has occipital and cervical headaches. Pt reports that the dizziness and headaches improved after the occipital nerve block. Pt reports that the symptoms of headache and dizziness started back again last week. Pt denies vertigo. She describes symptoms as "fear of falling." She feels like she is going to stumble. Pt had therapy previously with improvement in symptoms. She believes that the therapy focused on her neck pain. She also complains of "weakness"  and decreased energy.     Therapeutic exercise:  Leg press x 75 lbs, 90 lbs  X 20 x 2 sets standing hip abd with YTB , hip extension with YTB x 20  side stepping left and right in parallel bars 10 feet x 3 standing on blue foam with tapping on step  x 20 across midline step ups from floor to 6 inch x 20 bilateral sit to stand x 10 marching in parallel bars x 20  Min cueing needed to appropriately perform balance and strengthening tasks with leg, hand, and head position. Decreased coordination demonstrated requiring consistent verbal cueing to correct form.  Patient responds well to verbal and tactile cues to correct form and technique.  CGA to SBA for safety  with activities.  Uses to increase intensity and amplitude of movements throughout session.                           PT Education - 12/03/15 1147    Education provided Yes   Education Details HEP   Person(s) Educated Patient   Methods Explanation   Comprehension Verbalized understanding             PT Long Term Goals - 11/14/15 1201    PT LONG TERM GOAL #1   Title Pt will be independent with HEP in order to manage imbalance and decrease risk for falls   Time 8   Period Weeks   Status New   PT LONG TERM GOAL #2   Title Pt will improve gait speed by at least 0.13 m/s to demonstrate improved household and community mobility.    Baseline 11/14/15: 0.87 m/s   Time 8   Period Weeks   Status New   PT LONG TERM GOAL #3   Title Pt will improve DGI by at least 3 points in order to demonstrate decreased risk for falls    Baseline 11/14/15: 18/24   Time 8   Period Weeks   Status New               Plan - 12/03/15 1149    Clinical Impression Statement CGA to SBA for safety with activities.  Uses to increase intensity and amplitude of movements throughout session.   Pt will benefit from skilled therapeutic intervention in order to improve on the following deficits Abnormal gait;Decreased activity tolerance;Decreased mobility;Decreased balance;Pain;Obesity;Difficulty walking;Decreased strength   Rehab Potential Fair   Clinical Impairments Affecting Rehab Potential Positive: motivation, Negative: comorbid conditions   PT Frequency 2x / week   PT Duration 8 weeks   PT Treatment/Interventions ADLs/Self Care Home Management;Canalith Repostioning;DME Instruction;Gait training;Stair training;Functional mobility training;Therapeutic activities;Therapeutic exercise;Balance training;Neuromuscular re-education;Patient/family education;Manual techniques   PT Next Visit Plan BERG or miniBest, 6MWT, TUG, 5TSTS, issue HEP   PT Home Exercise Plan None issued due to time  constraints   Consulted and Agree with Plan of Care Patient        Problem List Patient Active Problem List   Diagnosis Date Noted  . Vitamin D deficiency 11/29/2015  . Multiple food allergies 11/29/2015  . Headache 10/28/2015  . Leg cramps 10/24/2015  . Knee pain, bilateral 08/29/2015  . Sleep apnea 05/30/2015  . Adenomatous colon polyp 05/09/2015  . HLD (hyperlipidemia) 05/09/2015  . Restless leg 05/09/2015  . Allergic rhinitis 03/28/2015  . Absolute anemia 03/28/2015  . Anxiety and depression 03/28/2015  . Arthritis 03/28/2015  . Cervical pain 03/28/2015  . Chronic headache 03/28/2015  .  History of colonic polyps 03/28/2015  . H/O: osteoarthritis 03/28/2015  . Clinical depression 03/28/2015  . Diverticulosis of colon 03/28/2015  . DDD (degenerative disc disease), lumbar 03/28/2015  . Acid reflux 03/28/2015  . 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 Essential hypertension 03/28/2015  . Adaptive colitis 03/28/2015  . Narrowing of intervertebral disc space 03/28/2015  . Nasal septal perforation 03/28/2015  . Recurrent sinus infections 03/28/2015  . Spinal stenosis 03/28/2015  . Gastric ulcer 03/28/2015  . Cerebral artery occlusion with cerebral infarction ([place]) 03/28/2015  . Aortic atherosclerosis ([place]) 07/13/2014  . H/O adenomatous polyp of colon 06/27/2005    Alanson Puls 12/03/2015, 11:51 AM  [place] MAIN [facility] SERVICES [address] [place], [place], [address] Phone: [phone]   Fax:  [phone]  Name: HAZELY BREUNINGER MRN: [id number] Date of Birth: 12-30-1932

## 2015-12-04 ENCOUNTER — Other Ambulatory Visit: Payer: Self-pay | Admitting: Neurology

## 2015-12-04 DIAGNOSIS — R519 Headache, unspecified: Secondary | ICD-10-CM

## 2015-12-04 DIAGNOSIS — R413 Other amnesia: Secondary | ICD-10-CM

## 2015-12-04 DIAGNOSIS — R51 Headache: Principal | ICD-10-CM

## 2015-12-06 ENCOUNTER — Ambulatory Visit: Payer: Medicare Other | Admitting: Physical Therapy

## 2015-12-06 DIAGNOSIS — M5481 Occipital neuralgia: Secondary | ICD-10-CM | POA: Diagnosis not present

## 2015-12-06 DIAGNOSIS — R51 Headache: Secondary | ICD-10-CM | POA: Diagnosis not present

## 2015-12-11 ENCOUNTER — Encounter: Payer: Private Health Insurance - Indemnity | Admitting: Physical Therapy

## 2015-12-12 DIAGNOSIS — F331 Major depressive disorder, recurrent, moderate: Secondary | ICD-10-CM | POA: Diagnosis not present

## 2015-12-12 DIAGNOSIS — F411 Generalized anxiety disorder: Secondary | ICD-10-CM | POA: Diagnosis not present

## 2015-12-13 ENCOUNTER — Telehealth: Payer: Self-pay | Admitting: Family Medicine

## 2015-12-13 ENCOUNTER — Encounter: Payer: Private Health Insurance - Indemnity | Admitting: Physical Therapy

## 2015-12-13 ENCOUNTER — Emergency Department
Admission: EM | Admit: 2015-12-13 | Discharge: 2015-12-13 | Disposition: A | Discharge status: Home / Self Care | Payer: Medicare Other | Attending: Emergency Medicine | Admitting: Emergency Medicine

## 2015-12-13 ENCOUNTER — Emergency Department: Payer: Medicare Other

## 2015-12-13 DIAGNOSIS — R51 Headache: Secondary | ICD-10-CM | POA: Diagnosis not present

## 2015-12-13 DIAGNOSIS — Z79899 Other long term (current) drug therapy: Secondary | ICD-10-CM | POA: Insufficient documentation

## 2015-12-13 DIAGNOSIS — Z96652 Presence of left artificial knee joint: Secondary | ICD-10-CM | POA: Diagnosis not present

## 2015-12-13 DIAGNOSIS — R55 Syncope and collapse: Secondary | ICD-10-CM | POA: Diagnosis not present

## 2015-12-13 DIAGNOSIS — M25561 Pain in right knee: Secondary | ICD-10-CM | POA: Diagnosis not present

## 2015-12-13 DIAGNOSIS — I10 Essential (primary) hypertension: Secondary | ICD-10-CM | POA: Diagnosis not present

## 2015-12-13 DIAGNOSIS — Y9289 Other specified places as the place of occurrence of the external cause: Secondary | ICD-10-CM | POA: Diagnosis not present

## 2015-12-13 DIAGNOSIS — Y998 Other external cause status: Secondary | ICD-10-CM | POA: Diagnosis not present

## 2015-12-13 DIAGNOSIS — Z7982 Long term (current) use of aspirin: Secondary | ICD-10-CM | POA: Insufficient documentation

## 2015-12-13 DIAGNOSIS — S8001XA Contusion of right knee, initial encounter: Secondary | ICD-10-CM

## 2015-12-13 DIAGNOSIS — S199XXA Unspecified injury of neck, initial encounter: Secondary | ICD-10-CM | POA: Insufficient documentation

## 2015-12-13 DIAGNOSIS — S7001XA Contusion of right hip, initial encounter: Secondary | ICD-10-CM

## 2015-12-13 DIAGNOSIS — Y9389 Activity, other specified: Secondary | ICD-10-CM | POA: Diagnosis not present

## 2015-12-13 DIAGNOSIS — W1839XA Other fall on same level, initial encounter: Secondary | ICD-10-CM | POA: Diagnosis not present

## 2015-12-13 DIAGNOSIS — S80212A Abrasion, left knee, initial encounter: Secondary | ICD-10-CM | POA: Insufficient documentation

## 2015-12-13 DIAGNOSIS — M25551 Pain in right hip: Secondary | ICD-10-CM | POA: Diagnosis not present

## 2015-12-13 DIAGNOSIS — Z880 Allergy status to penicillin: Secondary | ICD-10-CM | POA: Insufficient documentation

## 2015-12-13 DIAGNOSIS — Z7951 Long term (current) use of inhaled steroids: Secondary | ICD-10-CM | POA: Diagnosis not present

## 2015-12-13 LAB — BASIC METABOLIC PANEL
Anion gap: 5 (ref 5–15)
BUN: 19 mg/dL (ref 6–20)
CO2: 26 mmol/L (ref 22–32)
Calcium: 9.2 mg/dL (ref 8.9–10.3)
Chloride: 104 mmol/L (ref 101–111)
Creatinine, Ser: 0.66 mg/dL (ref 0.44–1.00)
Glucose, Bld: 102 mg/dL — ABNORMAL HIGH (ref 65–99)
POTASSIUM: 3.8 mmol/L (ref 3.5–5.1)
SODIUM: 135 mmol/L (ref 135–145)

## 2015-12-13 LAB — CBC
HEMATOCRIT: 36.1 % (ref 35.0–47.0)
Hemoglobin: 12.2 g/dL (ref 12.0–16.0)
MCH: 29.9 pg (ref 26.0–34.0)
MCHC: 33.8 g/dL (ref 32.0–36.0)
MCV: 88.4 fL (ref 80.0–100.0)
PLATELETS: 189 10*3/uL (ref 150–440)
RBC: 4.09 MIL/uL (ref 3.80–5.20)
RDW: 14.7 % — AB (ref 11.5–14.5)
WBC: 5.8 10*3/uL (ref 3.6–11.0)

## 2015-12-13 NOTE — ED Notes (Signed)
Pt family reports she fell this am at about 5. Pt unsure how she fell or if she passed out. Pt c/o pain to left knee. Pt family reports pt has been falling recently and has a brain MRI scheduled next Friday.

## 2015-12-13 NOTE — Telephone Encounter (Signed)
Pt fell and went the ED today and was advised to follow up with Dr. Venia Minks next week. When can I work her in? Pt's daughter in law Tye [place] would like to bring her Tuesday morning if possible b/c pt already has several appts the rest of next. Please advise. Thanks TNP

## 2015-12-13 NOTE — ED Notes (Signed)
Pt c/o pain to right knee and hip and reports a scrape on her left knew.

## 2015-12-13 NOTE — ED Provider Notes (Signed)
Medical screening examination/treatment/procedure(s) were performed by non-physician practitioner and as supervising physician I was immediately available for consultation/collaboration.    Earleen Newport, MD 12/13/15 [phone]

## 2015-12-13 NOTE — Telephone Encounter (Signed)
Spoke with pt's daughter in law and scheduled pt for ED f/u on 12/19/15 @ 12 pm. The appt was ok per [place]. Thanks TNP

## 2015-12-13 NOTE — Discharge Instructions (Signed)
Contusion A contusion is a deep bruise. Contusions happen when an injury causes bleeding under the skin. Symptoms of bruising include pain, swelling, and discolored skin. The skin may turn blue, purple, or yellow. HOME CARE   Rest the injured area.  If told, put ice on the injured area.  Put ice in a plastic bag.  Place a towel between your skin and the bag.  Leave the ice on for 20 minutes, 2-3 times per day.  If told, put light pressure (compression) on the injured area using an elastic bandage. Make sure the bandage is not too tight. Remove it and put it back on as told by your doctor.  If possible, raise (elevate) the injured area above the level of your heart while you are sitting or lying down.  Take over-the-counter and prescription medicines only as told by your doctor. GET HELP IF:  Your symptoms do not get better after several days of treatment.  Your symptoms get worse.  You have trouble moving the injured area. GET HELP RIGHT AWAY IF:   You have very bad pain.  You have a loss of feeling (numbness) in a hand or foot.  Your hand or foot turns pale or cold.   This information is not intended to replace advice given to you by your health care provider. Make sure you discuss any questions you have with your health care provider.   Document Released: 04/21/2008 Document Revised: 07/25/2015 Document Reviewed: 03/21/2015 Elsevier Interactive Patient Education 2016 [employer]  Cryotherapy Cryotherapy is when you put ice on your injury. Ice helps lessen pain and puffiness (swelling) after an injury. Ice works the best when you start using it in the first 24 to 48 hours after an injury. HOME CARE  Put a dry or damp towel between the ice pack and your skin.  You may press gently on the ice pack.  Leave the ice on for no more than 10 to 20 minutes at a time.  Check your skin after 5 minutes to make sure your skin is okay.  Rest at least 20 minutes between ice  pack uses.  Stop using ice when your skin loses feeling (numbness).  Do not use ice on someone who cannot tell you when it hurts. This includes small children and people with memory problems (dementia). GET HELP RIGHT AWAY IF:  You have white spots on your skin.  Your skin turns blue or pale.  Your skin feels waxy or hard.  Your puffiness gets worse. MAKE SURE YOU:   Understand these instructions.  Will watch your condition.  Will get help right away if you are not doing well or get worse.   This information is not intended to replace advice given to you by your health care provider. Make sure you discuss any questions you have with your health care provider.   Document Released: 04/21/2008 Document Revised: 01/26/2012 Document Reviewed: 06/26/2011 Elsevier Interactive Patient Education 2016 [employer].  Syncope Syncope is a medical term for fainting or passing out. This means you lose consciousness and drop to the ground. People are generally unconscious for less than 5 minutes. You may have some muscle twitches for up to 15 seconds before waking up and returning to normal. Syncope occurs more often in older adults, but it can happen to anyone. While most causes of syncope are not dangerous, syncope can be a sign of a serious medical problem. It is important to seek medical care.  CAUSES  Syncope is  caused by a sudden drop in blood flow to the brain. The specific cause is often not determined. Factors that can bring on syncope include:  Taking medicines that lower blood pressure.  Sudden changes in posture, such as standing up quickly.  Taking more medicine than prescribed.  Standing in one place for too long.  Seizure disorders.  Dehydration and excessive exposure to heat.  Low blood sugar (hypoglycemia).  Straining to have a bowel movement.  Heart disease, irregular heartbeat, or other circulatory problems.  Fear, emotional distress, seeing blood, or severe  pain. SYMPTOMS  Right before fainting, you may:  Feel dizzy or light-headed.  Feel nauseous.  See all white or all black in your field of vision.  Have cold, clammy skin. DIAGNOSIS  Your health care provider will ask about your symptoms, perform a physical exam, and perform an electrocardiogram (ECG) to record the electrical activity of your heart. Your health care provider may also perform other heart or blood tests to determine the cause of your syncope which may include:  Transthoracic echocardiogram (TTE). During echocardiography, sound waves are used to evaluate how blood flows through your heart.  Transesophageal echocardiogram (TEE).  Cardiac monitoring. This allows your health care provider to monitor your heart rate and rhythm in real time.  Holter monitor. This is a portable device that records your heartbeat and can help diagnose heart arrhythmias. It allows your health care provider to track your heart activity for several days, if needed.  Stress tests by exercise or by giving medicine that makes the heart beat faster. TREATMENT  In most cases, no treatment is needed. Depending on the cause of your syncope, your health care provider may recommend changing or stopping some of your medicines. HOME CARE INSTRUCTIONS  Have someone stay with you until you feel stable.  Do not drive, use machinery, or play sports until your health care provider says it is okay.  Keep all follow-up appointments as directed by your health care provider.  Lie down right away if you start feeling like you might faint. Breathe deeply and steadily. Wait until all the symptoms have passed.  Drink enough fluids to keep your urine clear or pale yellow.  If you are taking blood pressure or heart medicine, get up slowly and take several minutes to sit and then stand. This can reduce dizziness. SEEK IMMEDIATE MEDICAL CARE IF:   You have a severe headache.  You have unusual pain in the chest,  abdomen, or back.  You are bleeding from your mouth or rectum, or you have black or tarry stool.  You have an irregular or very fast heartbeat.  You have pain with breathing.  You have repeated fainting or seizure-like jerking during an episode.  You faint when sitting or lying down.  You have confusion.  You have trouble walking.  You have severe weakness.  You have vision problems. If you fainted, call your local emergency services (911 in [place] Do not drive yourself to the hospital.    This information is not intended to replace advice given to you by your health care provider. Make sure you discuss any questions you have with your health care provider.   Document Released: 11/03/2005 Document Revised: 03/20/2015 Document Reviewed: 01/02/2012 Elsevier Interactive Patient Education [employer].

## 2015-12-13 NOTE — ED Provider Notes (Signed)
EKG: Interpreted by me, normal sinus rhythm with a rate of 70 bpm, normal PR interval, normal QRS, normal QT interval. Normal axis, no evidence of acute infarction  Earleen Newport, MD 12/13/15 1206

## 2015-12-13 NOTE — ED Provider Notes (Signed)
[facility] Emergency Department Provider Note  ____________________________________________  Time seen: Approximately 11:32 AM  I have reviewed the triage vital signs and the nursing notes.   HISTORY  Chief Complaint Fall and Knee Pain    HPI Helen Ramsey is a 81 y.o. female, NAD, presents to the emergency department this morning accompanied by her family after a fall early this morning. States she was in the bathroom, sitting on the toilet after a bowel movement and fell to the floor. Does not remember if she fell asleep or passed out. Was able to continue morning activities normally. Has had one other episode of syncope in December 2016 which was reported to her primary care physician. She is scheduled for an MRI on 12/20/2014. 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 Denies headache, changes in speech. Is able to ambulate with a cane but notes significant pain in left hip and left knee. No she has significant arthritis of the left knee and is awaiting total knee replacement is also being monitored by orthopedist. Currently denies chest pain, shortness of breath, headache, palpitations, neck or back pain.   Past Medical History  Diagnosis Date  . Allergic rhinitis, cause unspecified   . Urinary frequency   . Dizziness and giddiness   . Atherosclerosis of aorta ([place])   . Unspecified essential hypertension   . Other and unspecified hyperlipidemia   . Degeneration of lumbar or lumbosacral intervertebral disc   . Unspecified sleep apnea   . Allergic rhinitis, cause unspecified   . Headache(784.0)   . Dysthymic disorder   . Myalgia and myositis, unspecified   . Osteoporosis, unspecified   . Trigger finger (acquired)   . Other specified disorders of bladder   . Diverticulitis of colon (without mention of hemorrhage)   . Other diseases of nasal cavity and  sinuses(478.19)     Patient Active Problem List   Diagnosis Date Noted  . Vitamin D deficiency 11/29/2015  . Multiple food allergies 11/29/2015  . Headache 10/28/2015  . Leg cramps 10/24/2015  . Knee pain, bilateral 08/29/2015  . Sleep apnea 05/30/2015  . Adenomatous colon polyp 05/09/2015  . HLD (hyperlipidemia) 05/09/2015  . Restless leg 05/09/2015  . Allergic rhinitis 03/28/2015  . Absolute anemia 03/28/2015  . Anxiety and depression 03/28/2015  . Arthritis 03/28/2015  . Cervical pain 03/28/2015  . Chronic headache 03/28/2015  . History of colonic polyps 03/28/2015  . H/O: osteoarthritis 03/28/2015  . Clinical depression 03/28/2015  . Diverticulosis of colon 03/28/2015  . DDD (degenerative disc disease), lumbar 03/28/2015  . Acid reflux 03/28/2015  . 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 Essential hypertension 03/28/2015  . Adaptive colitis 03/28/2015  . Narrowing of intervertebral disc space 03/28/2015  . Nasal septal perforation 03/28/2015  . Recurrent sinus infections 03/28/2015  . Spinal stenosis 03/28/2015  . Gastric ulcer 03/28/2015  . Cerebral artery occlusion with cerebral infarction ([place]) 03/28/2015  . Aortic atherosclerosis ([place]) 07/13/2014  . H/O adenomatous polyp of colon 06/27/2005    Past Surgical History  Procedure Laterality Date  . Carpal tunnel release    . Tonsillectomy    . Abdominal hysterectomy    . Temporal artery biopsy / ligation    . Adenoidectomy    . R/o lymph nodes; right axilla    . Arthroscopic knee surgery    . Cardiac catheterization      [facility]  .  Appendectomy    . Cholecystectomy      Current Outpatient Rx  Name  Route  Sig  Dispense  Refill  . acetaminophen (TYLENOL) 500 MG tablet   Oral   Take 500 mg by mouth 3 (three) times daily as needed.         Marland Kitchen alendronate (FOSAMAX) 70 MG tablet   Oral   Take 70 mg by mouth once a week.          Marland Kitchen aspirin 81 MG tablet   Oral   Take 81 mg by  mouth daily.         Marland Kitchen atorvastatin (LIPITOR) 20 MG tablet   Oral   Take 1 tablet (20 mg total) by mouth daily.   90 tablet   3   . azelastine (ASTELIN) 0.1 % nasal spray   Each Nare   Place 1 spray into both nostrils as needed for rhinitis. Use in each nostril as directed         . Calcium Carbonate-Vitamin D 600-200 MG-UNIT CAPS   Oral   Take 1 capsule by mouth 2 (two) times daily.   1 each   0   . cetirizine (ZYRTEC) 10 MG tablet   Oral   Take 10 mg by mouth daily.          . cholecalciferol (VITAMIN D) 1000 UNITS tablet   Oral   Take 1,000 Units by mouth daily.         . cholecalciferol (VITAMIN D) 1000 units tablet   Oral   Take 1 tablet (1,000 Units total) by mouth daily.   1 tablet   0   . docusate sodium (COLACE) 100 MG capsule   Oral   Take 100 mg by mouth as needed for mild constipation.         . DULoxetine (CYMBALTA) 30 MG capsule      TAKE ONE (1) CAPSULE EACH DAY   90 capsule   3   . fluticasone (FLONASE) 50 MCG/ACT nasal spray   Each Nare   Place 2 sprays into both nostrils daily.   48 g   3   . gabapentin (NEURONTIN) 300 MG capsule   Oral   Take 1 capsule (300 mg total) by mouth 2 (two) times daily.   180 capsule   1   . lisinopril-hydrochlorothiazide (PRINZIDE,ZESTORETIC) 20-25 MG per tablet   Oral   Take 1 tablet by mouth daily.   90 tablet   3   . MAGNESIUM GLUCONATE PO   Oral   Take 400 mg by mouth daily.         . Omega-3 Fatty Acids (FISH OIL) 1200 MG CAPS   Oral   Take 2 capsules by mouth daily.          Marland Kitchen omeprazole (PRILOSEC) 20 MG capsule   Oral   Take 1 capsule (20 mg total) by mouth daily.   90 capsule   3   . Potassium Gluconate 595 MG CAPS   Oral   Take 1 capsule by mouth daily.          . ranitidine (ZANTAC) 150 MG tablet   Oral   Take 150 mg by mouth as needed for heartburn.         . sodium chloride (OCEAN) 0.65 % nasal spray      1 spray by Nasal route once daily.         .  traMADol (ULTRAM) 50 MG tablet  Oral   Take 1 tablet (50 mg total) by mouth 2 (two) times daily as needed. Patient taking differently: Take 50 mg by mouth 2 (two) times daily.    180 tablet   1     Allergies Amoxicillin; Clindamycin/lincomycin; Doxycycline; Levaquin; Mirabegron; Nsaids; Phenergan; Singulair; Vicodin; Zocor; Zoloft ; Ampicillin; Bactrim; and Penicillins  Family History  Problem Relation Age of Onset  . Heart disease Mother   . Depression Mother   . Colon polyps Mother   . Heart disease Father   . Heart disease Brother   . Heart disease Brother     Social History Social History  Substance Use Topics  . Smoking status: Never Smoker   . Smokeless tobacco: Never Used  . Alcohol Use: No     Review of Systems  Constitutional: No fever/chills. No fatigue. Eyes: No visual changes.  ENT:  No tinnitus Cardiovascular: No chest pain, palpitations. Respiratory: No shortness of breath. Gastrointestinal: No abdominal pain.  No nausea, no vomiting.   Musculoskeletal: Positive for right hip pain and right knee pain. No back pain Skin: Negative for rash. Abrasion to the left knee Neurological: Negative for headaches, focal weakness or numbness. Positive for syncope 10-point ROS otherwise negative.  ____________________________________________   PHYSICAL EXAM:  VITAL SIGNS: ED Triage Vitals  Enc Vitals Group     BP 12/13/15 0949 121/91 mmHg     Pulse Rate 12/13/15 0949 69     Resp 12/13/15 0949 20     Temp 12/13/15 0949 98.2 F (36.8 C)     Temp Source 12/13/15 0949 Oral     SpO2 12/13/15 0949 97 %     Weight 12/13/15 0949 186 lb (84.369 kg)     Height 12/13/15 0949 5' (1.524 m)     Head Cir --      Peak Flow --      Pain Score 12/13/15 0950 8     Pain Loc --      Pain Edu? --      Excl. in [place]? --     Constitutional: Alert and oriented. Well appearing and in no acute distress. Eyes: Conjunctivae are normal. PERRL. EOMI. Head: Atraumatic. ENT:       Ears:       Nose: No congestion/rhinnorhea.      Mouth/Throat: Mucous membranes are moist.  Neck: Cervical spine tenderness. Neck is supple with full range of motion Hematological/Lymphatic/Immunilogical: No cervical lymphadenopathy. Cardiovascular: Normal rate, regular rhythm.  Good peripheral circulation. Respiratory: Normal respiratory effort without tachypnea or retractions. Lungs CTAB. Musculoskeletal: Full range of motion of right hip and right knee but with pain with full extension and flexion. Some crepitus noted to the right knee with full range of motion. No joint effusions. Neurologic:  Normal speech and language. No gross focal neurologic deficits are appreciated.  Skin:  Skin is warm, dry. No rash. No ecchymosis. Superficial abrasion to the anterior left knee. Psychiatric: Mood and affect are normal. Speech and behavior are normal. Patient exhibits appropriate insight and judgement. Able to follow verbal commands with ease.   ____________________________________________   LABS (all labs ordered are listed, but only abnormal results are displayed)  Labs Reviewed  BASIC METABOLIC PANEL - Abnormal; Notable for the following:    Glucose, Bld 102 (*)    All other components within normal limits  CBC - Abnormal; Notable for the following:    RDW 14.7 (*)    All other components within normal limits   ____________________________________________  EKG  EKG shows sinus rhythm without evidence of STEMI. EKG was reviewed and signed by Dr. Lenise Arena. ____________________________________________  RADIOLOGY I, [place], personally viewed and evaluated these images (plain radiographs) as part of my medical decision making, as well as reviewing the written report by the radiologist.  Dg Knee Complete 4 Views Right  12/13/2015  CLINICAL DATA:  Pain after trauma. EXAM: RIGHT KNEE - COMPLETE 4+ VIEW COMPARISON:  None. FINDINGS: Degenerative changes are seen in the medial  and patellofemoral compartments. No fractures identified. No significant joint effusion. IMPRESSION: No acute abnormality. Electronically Signed   By: Dorise Bullion III M.D   On: 12/13/2015 11:42   Dg Hip Unilat With Pelvis 2-3 Views Right  12/13/2015  CLINICAL DATA:  Pain following fall EXAM: DG HIP (WITH OR WITHOUT PELVIS) 2-3V RIGHT COMPARISON:  None. FINDINGS: Frontal pelvis as well as frontal and lateral right hip images were obtained. There is no demonstrable fracture or dislocation. There is slight symmetric narrowing of both hip joints. There is a surgical clip in the right pelvis. No erosive changes. IMPRESSION: Slight symmetric narrowing both hip joints. No fracture or dislocation. Electronically Signed   By: Lowella Grip III M.D.   On: 12/13/2015 11:42    ____________________________________________    PROCEDURES  Procedure(s) performed: None    Medications - No data to display   ____________________________________________   INITIAL IMPRESSION / ASSESSMENT AND PLAN / ED COURSE  Pertinent labs & imaging results that were available during my care of the patient were reviewed by me and considered in my medical decision making (see chart for details).  Patient's diagnosis is consistent with vasovagal syncopal episode resulting in right-sided hip and knee contusions. Patient will be discharged home with instructions to take Tylenol as needed for pain. May also ice right hip and knee as needed. Patient is to follow up with her primary care physician to discuss her visit in the emergency department today. I do advise follow-up with cardiology to further evaluate syncopal episodes. The patient patient and family have requested to see Dr. Clayborn Bigness with [facility] and plan to stop by the office to schedule an appointment today. The patient and family agree with current impression and plan. Patient and family present is given ED precautions to return to the ED for any  worsening or new symptoms.     ____________________________________________  FINAL CLINICAL IMPRESSION(S) / ED DIAGNOSES  Final diagnoses:  Vasovagal syncope  Contusion of right hip, initial encounter  Contusion of right knee, initial encounter      NEW MEDICATIONS STARTED DURING THIS VISIT:  Discharge Medication List as of 12/13/2015 12:04 PM          Braxton Feathers, PA-C 12/13/15 1231  Earleen Newport, MD 12/13/15 1252

## 2015-12-17 DIAGNOSIS — J301 Allergic rhinitis due to pollen: Secondary | ICD-10-CM | POA: Diagnosis not present

## 2015-12-17 DIAGNOSIS — J305 Allergic rhinitis due to food: Secondary | ICD-10-CM | POA: Diagnosis not present

## 2015-12-18 ENCOUNTER — Encounter: Payer: Private Health Insurance - Indemnity | Admitting: Physical Therapy

## 2015-12-19 ENCOUNTER — Ambulatory Visit (INDEPENDENT_AMBULATORY_CARE_PROVIDER_SITE_OTHER): Payer: Medicare Other | Admitting: Family Medicine

## 2015-12-19 ENCOUNTER — Encounter: Payer: Self-pay | Admitting: Family Medicine

## 2015-12-19 VITALS — BP 112/56 | HR 80 | Temp 98.3°F | Resp 16 | Wt 185.0 lb

## 2015-12-19 DIAGNOSIS — S7001XD Contusion of right hip, subsequent encounter: Secondary | ICD-10-CM

## 2015-12-19 DIAGNOSIS — I10 Essential (primary) hypertension: Secondary | ICD-10-CM

## 2015-12-19 DIAGNOSIS — R55 Syncope and collapse: Secondary | ICD-10-CM

## 2015-12-19 DIAGNOSIS — S8001XD Contusion of right knee, subsequent encounter: Secondary | ICD-10-CM | POA: Diagnosis not present

## 2015-12-19 DIAGNOSIS — F418 Other specified anxiety disorders: Secondary | ICD-10-CM | POA: Insufficient documentation

## 2015-12-19 MED ORDER — CALCIUM CARBONATE-VITAMIN D 600-400 MG-UNIT PO TABS: 1.0000 | ORAL_TABLET | Freq: Two times a day (BID) | ORAL | Status: DC

## 2015-12-19 MED ORDER — LISINOPRIL-HYDROCHLOROTHIAZIDE 10-12.5 MG PO TABS: 1.0000 | ORAL_TABLET | Freq: Every day | ORAL | Status: DC

## 2015-12-19 NOTE — Progress Notes (Signed)
Patient ID: Helen Ramsey, female   DOB: March 11, 1933, 82 y.o.   MRN: [id number]        Patient: Helen Ramsey Female    DOB: 02/25/1933   82 y.o.   MRN: [id number] Visit Date: 12/19/2015  Today's Provider: Margarita Rana, MD   Chief Complaint  Patient presents with  . Hospitalization Follow-up    Vasovagal Syncope  . Hip Pain  . Knee Pain   Subjective:      Follow up Hospitalization  Patient was admitted to [facility] ED on 12/13/2015 She was treated for Vasovagal Syncope, Contusion to Right Knee and Hip. Treatment for this included Tylenol, Tramadol. She reports good compliance with treatment. She reports this condition is About the same. ------------------------------------------------------------------------------------    Hip Pain  The incident occurred more than 1 week ago. The incident occurred at home. The injury mechanism was a fall. The pain is present in the right hip. Associated symptoms include numbness.  Knee Pain  The incident occurred more than 1 week ago. The incident occurred at home. The injury mechanism was a fall. The pain is present in the right knee. Associated symptoms include numbness. She has tried acetaminophen for the symptoms. The treatment provided mild relief.  Loss of Consciousness This is a new problem. The current episode started in the past 7 days. She lost consciousness for a period of less than 1 minute. The symptoms are aggravated by defecation. Associated symptoms include headaches, nausea and weakness (In her right leg; has a orthopedic referal tomorrow.). Pertinent negatives include no abdominal pain, back pain, chest pain, diaphoresis, dizziness, fever, light-headedness, palpitations or vomiting.       Allergies  Allergen Reactions  . Amoxicillin   . Clindamycin/Lincomycin   . Doxycycline   . Levaquin [Levofloxacin In D5w]   . Mirabegron Nausea And Vomiting  . Nsaids Other (See Comments)    GI upset  . Phenergan [Promethazine Hcl]    . Singulair [Montelukast]   . Vicodin [Hydrocodone-Acetaminophen]   . Zocor [Simvastatin]   . Zoloft  [Sertraline] Other (See Comments)  . Ampicillin Rash  . Bactrim [Sulfamethoxazole-Trimethoprim] Rash  . Penicillins Rash    Ampicillin, Clindamycin, Doxycycline Levaquin-severe headache, muscle and joint aches, weak   Previous Medications   ACETAMINOPHEN (TYLENOL) 500 MG TABLET    Take 500 mg by mouth 3 (three) times daily as needed.   ALENDRONATE (FOSAMAX) 70 MG TABLET    Take 70 mg by mouth once a week.    ASPIRIN 81 MG TABLET    Take 81 mg by mouth daily.   ATORVASTATIN (LIPITOR) 20 MG TABLET    Take 1 tablet (20 mg total) by mouth daily.   AZELASTINE (ASTELIN) 0.1 % NASAL SPRAY    Place 1 spray into both nostrils as needed for rhinitis. Use in each nostril as directed   CETIRIZINE (ZYRTEC) 10 MG TABLET    Take 10 mg by mouth daily.    CHOLECALCIFEROL (VITAMIN D) 1000 UNITS TABLET    Take 1 tablet (1,000 Units total) by mouth daily.   CITALOPRAM (CELEXA) 10 MG TABLET       DOCUSATE SODIUM (COLACE) 100 MG CAPSULE    Take 100 mg by mouth as needed for mild constipation.   FLUTICASONE (FLONASE) 50 MCG/ACT NASAL SPRAY    Place 2 sprays into both nostrils daily.   GABAPENTIN (NEURONTIN) 300 MG CAPSULE    Take 1 capsule (300 mg total) by mouth 2 (two) times daily.   MAGNESIUM GLUCONATE  PO    Take 400 mg by mouth daily.   MELATONIN 1 MG TABS    Take 1 tablet by mouth at bedtime.   OMEGA-3 FATTY ACIDS (FISH OIL) 1200 MG CAPS    Take 2 capsules by mouth daily.    OMEPRAZOLE (PRILOSEC) 20 MG CAPSULE    Take 1 capsule (20 mg total) by mouth daily.   POTASSIUM GLUCONATE 595 MG CAPS    Take 1 capsule by mouth daily.    RANITIDINE (ZANTAC) 150 MG TABLET    Take 150 mg by mouth as needed for heartburn.   SODIUM CHLORIDE (OCEAN) 0.65 % NASAL SPRAY    1 spray by Nasal route once daily.   TRAMADOL (ULTRAM) 50 MG TABLET    Take 1 tablet (50 mg total) by mouth 2 (two) times daily as needed.     Review of Systems  Constitutional: Negative for fever, chills, diaphoresis, activity change, appetite change, fatigue and unexpected weight change.  Respiratory: Positive for shortness of breath (Chronic issue). Negative for apnea, cough, choking, chest tightness, wheezing and stridor.   Cardiovascular: Positive for syncope. Negative for chest pain, palpitations and leg swelling.  Gastrointestinal: Positive for nausea and diarrhea. Negative for vomiting, abdominal pain, constipation, blood in stool, abdominal distention, anal bleeding and rectal pain.       Pt has a history of IBS.   Musculoskeletal: Positive for arthralgias (Right knee and hip pain). Negative for myalgias, back pain, joint swelling, gait problem, neck pain and neck stiffness.  Neurological: Positive for syncope, weakness (In her right leg; has a orthopedic referal tomorrow.), numbness and headaches. Negative for dizziness, seizures and light-headedness.    Social History  Substance Use Topics  . Smoking status: Never Smoker   . Smokeless tobacco: Never Used  . Alcohol Use: No   Objective:   BP 112/56 mmHg  Pulse 80  Temp(Src) 98.3 F (36.8 C) (Oral)  Resp 16  Wt 185 lb (83.915 kg)  Physical Exam  Constitutional: She is oriented to person, place, and time. She appears well-developed and well-nourished.  Neurological: She is alert and oriented to person, place, and time.  Psychiatric: She has a normal mood and affect. Her behavior is normal. Judgment and thought content normal.        Assessment & Plan:      1. Vasovagal syncope Pt had a syncope episode on 12/13/2015.  Pt will follow up with Cardiology tomorrow.   2. Contusion, hip, right, subsequent encounter Secondary to syncope episode on 12/13/2015, will follow up with orthopedic on Friday.   3. Contusion, knee, right, subsequent encounter Secondary to syncope episode on 12/13/2015, will follow up with orthopedic on Friday.   4. Essential  hypertension Blood pressure is running low, will reduce Lisinopril/HCTZ in half.  Pt has a follow up appointment with Dr. Clayborn Bigness tomorrow.   - lisinopril-hydrochlorothiazide (PRINZIDE,ZESTORETIC) 10-12.5 MG tablet; Take 1 tablet by mouth daily.  Dispense: 90 tablet; Refill: 1     Patient was seen and examined by Jerrell Belfast, MD, and note scribed by Ashley Royalty, CMA.  I have reviewed the document for accuracy and completeness and I agree with above. - Jerrell Belfast, MD   Margarita Rana, MD  [facility]

## 2015-12-20 ENCOUNTER — Encounter: Payer: Private Health Insurance - Indemnity | Admitting: Physical Therapy

## 2015-12-20 DIAGNOSIS — K219 Gastro-esophageal reflux disease without esophagitis: Secondary | ICD-10-CM | POA: Diagnosis not present

## 2015-12-20 DIAGNOSIS — R55 Syncope and collapse: Secondary | ICD-10-CM | POA: Diagnosis not present

## 2015-12-20 DIAGNOSIS — Z01818 Encounter for other preprocedural examination: Secondary | ICD-10-CM | POA: Diagnosis not present

## 2015-12-20 DIAGNOSIS — R0602 Shortness of breath: Secondary | ICD-10-CM | POA: Diagnosis not present

## 2015-12-20 DIAGNOSIS — M1990 Unspecified osteoarthritis, unspecified site: Secondary | ICD-10-CM | POA: Diagnosis not present

## 2015-12-20 DIAGNOSIS — R011 Cardiac murmur, unspecified: Secondary | ICD-10-CM | POA: Diagnosis not present

## 2015-12-20 DIAGNOSIS — E669 Obesity, unspecified: Secondary | ICD-10-CM | POA: Diagnosis not present

## 2015-12-20 DIAGNOSIS — E785 Hyperlipidemia, unspecified: Secondary | ICD-10-CM | POA: Diagnosis not present

## 2015-12-20 DIAGNOSIS — R51 Headache: Secondary | ICD-10-CM | POA: Diagnosis not present

## 2015-12-20 DIAGNOSIS — I10 Essential (primary) hypertension: Secondary | ICD-10-CM | POA: Diagnosis not present

## 2015-12-20 DIAGNOSIS — M5481 Occipital neuralgia: Secondary | ICD-10-CM | POA: Diagnosis not present

## 2015-12-21 ENCOUNTER — Ambulatory Visit
Admission: RE | Admit: 2015-12-21 | Discharge: 2015-12-21 | Disposition: A | Discharge status: Home / Self Care | Payer: Medicare Other | Source: Ambulatory Visit | Attending: Neurology | Admitting: Neurology

## 2015-12-21 DIAGNOSIS — G3184 Mild cognitive impairment, so stated: Secondary | ICD-10-CM | POA: Diagnosis not present

## 2015-12-21 DIAGNOSIS — R9082 White matter disease, unspecified: Secondary | ICD-10-CM | POA: Insufficient documentation

## 2015-12-21 DIAGNOSIS — R42 Dizziness and giddiness: Secondary | ICD-10-CM | POA: Diagnosis not present

## 2015-12-21 DIAGNOSIS — R51 Headache: Secondary | ICD-10-CM | POA: Insufficient documentation

## 2015-12-21 DIAGNOSIS — R413 Other amnesia: Secondary | ICD-10-CM

## 2015-12-21 DIAGNOSIS — R519 Headache, unspecified: Secondary | ICD-10-CM

## 2015-12-21 DIAGNOSIS — M170 Bilateral primary osteoarthritis of knee: Secondary | ICD-10-CM | POA: Diagnosis not present

## 2015-12-24 DIAGNOSIS — R55 Syncope and collapse: Secondary | ICD-10-CM | POA: Diagnosis not present

## 2015-12-25 ENCOUNTER — Encounter: Payer: Private Health Insurance - Indemnity | Admitting: Physical Therapy

## 2015-12-27 ENCOUNTER — Encounter: Payer: Private Health Insurance - Indemnity | Admitting: Physical Therapy

## 2015-12-27 DIAGNOSIS — F331 Major depressive disorder, recurrent, moderate: Secondary | ICD-10-CM | POA: Diagnosis not present

## 2015-12-27 DIAGNOSIS — F411 Generalized anxiety disorder: Secondary | ICD-10-CM | POA: Diagnosis not present

## 2015-12-31 ENCOUNTER — Encounter: Payer: Private Health Insurance - Indemnity | Admitting: Physical Therapy

## 2015-12-31 DIAGNOSIS — R0602 Shortness of breath: Secondary | ICD-10-CM | POA: Diagnosis not present

## 2015-12-31 DIAGNOSIS — Z01818 Encounter for other preprocedural examination: Secondary | ICD-10-CM | POA: Diagnosis not present

## 2015-12-31 DIAGNOSIS — R011 Cardiac murmur, unspecified: Secondary | ICD-10-CM | POA: Diagnosis not present

## 2015-12-31 DIAGNOSIS — I6523 Occlusion and stenosis of bilateral carotid arteries: Secondary | ICD-10-CM | POA: Diagnosis not present

## 2015-12-31 DIAGNOSIS — R55 Syncope and collapse: Secondary | ICD-10-CM | POA: Diagnosis not present

## 2016-01-01 DIAGNOSIS — M1711 Unilateral primary osteoarthritis, right knee: Secondary | ICD-10-CM | POA: Diagnosis not present

## 2016-01-01 DIAGNOSIS — G8929 Other chronic pain: Secondary | ICD-10-CM | POA: Diagnosis not present

## 2016-01-01 DIAGNOSIS — M25561 Pain in right knee: Secondary | ICD-10-CM | POA: Diagnosis not present

## 2016-01-02 ENCOUNTER — Encounter: Payer: Private Health Insurance - Indemnity | Admitting: Physical Therapy

## 2016-01-04 ENCOUNTER — Telehealth: Payer: Self-pay | Admitting: Family Medicine

## 2016-01-04 NOTE — Telephone Encounter (Signed)
Opened messaged to send a message to nurse but after discussing more details with pt I had a nurse triage the call. Thanks TNP

## 2016-01-07 ENCOUNTER — Encounter: Payer: Private Health Insurance - Indemnity | Admitting: Physical Therapy

## 2016-01-07 ENCOUNTER — Encounter: Payer: Self-pay | Admitting: Family Medicine

## 2016-01-07 ENCOUNTER — Ambulatory Visit (INDEPENDENT_AMBULATORY_CARE_PROVIDER_SITE_OTHER): Payer: Medicare Other | Admitting: Family Medicine

## 2016-01-07 VITALS — BP 118/54 | HR 68 | Temp 98.2°F | Resp 16 | Wt 186.0 lb

## 2016-01-07 DIAGNOSIS — R011 Cardiac murmur, unspecified: Secondary | ICD-10-CM | POA: Insufficient documentation

## 2016-01-07 DIAGNOSIS — M9979 Connective tissue and disc stenosis of intervertebral foramina of abdomen and other regions: Secondary | ICD-10-CM

## 2016-01-07 DIAGNOSIS — I10 Essential (primary) hypertension: Secondary | ICD-10-CM

## 2016-01-07 MED ORDER — LISINOPRIL 5 MG PO TABS: 5.0000 mg | ORAL_TABLET | Freq: Every day | ORAL | Status: DC

## 2016-01-07 MED ORDER — GABAPENTIN 300 MG PO CAPS: ORAL_CAPSULE | ORAL | Status: DC

## 2016-01-07 MED ORDER — HYDROCHLOROTHIAZIDE 12.5 MG PO TABS: 12.5000 mg | ORAL_TABLET | Freq: Every day | ORAL | Status: DC

## 2016-01-07 NOTE — Progress Notes (Signed)
Subjective:    Patient ID: Helen Ramsey, female    DOB: 12/04/1932, 82 y.o.   MRN: [id number]  Hypertension This is a chronic problem. Progression since onset: Pt is concerned because pt has been having low BP. Condition status: Systolic BP ranges from [id number] and Diastolic BP ranges from [id number], according to at home BP cuff. Associated symptoms include anxiety ("occasional"), headaches, neck pain (arthritis of neck), peripheral edema (occasional) and shortness of breath (pt's baseline). Pertinent negatives include no blurred vision, chest pain, malaise/fatigue, orthopnea (sleeps in recliner due to back pain), palpitations or sweats. Treatments tried: Lisinopril-HCTZ 10-12.5 mg po qd. There are no compliance problems.   Muscle Pain Associated symptoms include headaches and shortness of breath (pt's baseline). Pertinent negatives include no chest pain.  This problem occurs at night time. Pt is requesting to increase Gabapentin 300 mg to 1 tab q am, and 2 tabs qhs.     Review of Systems  Constitutional: Negative for malaise/fatigue.  Eyes: Negative for blurred vision.  Respiratory: Positive for shortness of breath (pt's baseline).   Cardiovascular: Negative for chest pain, palpitations and orthopnea (sleeps in recliner due to back pain).  Musculoskeletal: Positive for myalgias and neck pain (arthritis of neck).  Neurological: Positive for headaches.   BP 118/54 mmHg  Pulse 68  Temp(Src) 98.2 F (36.8 C) (Oral)  Resp 16  Wt 186 lb (84.369 kg)   Patient Active Problem List   Diagnosis Date Noted  . Anxiety, generalized 12/19/2015  . Vitamin D deficiency 11/29/2015  . Multiple food allergies 11/29/2015  . Headache 10/28/2015  . Leg cramps 10/24/2015  . Knee pain, bilateral 08/29/2015  . Sleep apnea 05/30/2015  . Adenomatous colon polyp 05/09/2015  . HLD (hyperlipidemia) 05/09/2015  . Restless leg 05/09/2015  . Allergic rhinitis 03/28/2015  . Absolute anemia 03/28/2015  .  Anxiety and depression 03/28/2015  . Arthritis 03/28/2015  . Cervical pain 03/28/2015  . Chronic headache 03/28/2015  . History of colonic polyps 03/28/2015  . H/O: osteoarthritis 03/28/2015  . Clinical depression 03/28/2015  . Diverticulosis of colon 03/28/2015  . DDD (degenerative disc disease), lumbar 03/28/2015  . Acid reflux 03/28/2015  . 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 Essential hypertension 03/28/2015  . Adaptive colitis 03/28/2015  . Narrowing of intervertebral disc space 03/28/2015  . Nasal septal perforation 03/28/2015  . Recurrent sinus infections 03/28/2015  . Spinal stenosis 03/28/2015  . Gastric ulcer 03/28/2015  . Cerebral artery occlusion with cerebral infarction ([place]) 03/28/2015  . Aortic atherosclerosis ([place]) 07/13/2014  . H/O adenomatous polyp of colon 06/27/2005   Past Medical History  Diagnosis Date  . Allergic rhinitis, cause unspecified   . Urinary frequency   . Dizziness and giddiness   . Atherosclerosis of aorta ([place])   . Unspecified essential hypertension   . Other and unspecified hyperlipidemia   . Degeneration of lumbar or lumbosacral intervertebral disc   . Unspecified sleep apnea   . Allergic rhinitis, cause unspecified   . Headache(784.0)   . Dysthymic disorder   . Myalgia and myositis, unspecified   . Osteoporosis, unspecified   . Trigger finger (acquired)   . Other specified disorders of bladder   . Diverticulitis of colon (without mention of hemorrhage)   . Other diseases of nasal cavity and sinuses(478.19)    Current Outpatient Prescriptions on File Prior to Visit  Medication Sig  . acetaminophen (TYLENOL) 500 MG tablet Take 500 mg by mouth 3 (three) times daily as needed.  Marland Kitchen  alendronate (FOSAMAX) 70 MG tablet Take 70 mg by mouth once a week.   Marland Kitchen aspirin 81 MG tablet Take 81 mg by mouth daily.  Marland Kitchen atorvastatin (LIPITOR) 20 MG tablet Take 1 tablet (20 mg total) by mouth daily.  Marland Kitchen azelastine (ASTELIN) 0.1  % nasal spray Place 1 spray into both nostrils as needed for rhinitis. Use in each nostril as directed  . Calcium Carbonate-Vitamin D 600-400 MG-UNIT tablet Take 1 tablet by mouth 2 (two) times daily.  . cetirizine (ZYRTEC) 10 MG tablet Take 10 mg by mouth daily.   . cholecalciferol (VITAMIN D) 1000 units tablet Take 1 tablet (1,000 Units total) by mouth daily.  . citalopram (CELEXA) 10 MG tablet   . docusate sodium (COLACE) 100 MG capsule Take 100 mg by mouth as needed for mild constipation.  . fluticasone (FLONASE) 50 MCG/ACT nasal spray Place 2 sprays into both nostrils daily.  Marland Kitchen gabapentin (NEURONTIN) 300 MG capsule Take 1 capsule (300 mg total) by mouth 2 (two) times daily.  Marland Kitchen lisinopril-hydrochlorothiazide (PRINZIDE,ZESTORETIC) 10-12.5 MG tablet Take 1 tablet by mouth daily.  Marland Kitchen MAGNESIUM GLUCONATE PO Take 400 mg by mouth daily.  . Melatonin 1 MG TABS Take 1 tablet by mouth at bedtime.  . Omega-3 Fatty Acids (FISH OIL) 1200 MG CAPS Take 2 capsules by mouth daily.   Marland Kitchen omeprazole (PRILOSEC) 20 MG capsule Take 1 capsule (20 mg total) by mouth daily.  . Potassium Gluconate 595 MG CAPS Take 1 capsule by mouth daily.   . sodium chloride (OCEAN) 0.65 % nasal spray 1 spray by Nasal route once daily.  . traMADol (ULTRAM) 50 MG tablet Take 1 tablet (50 mg total) by mouth 2 (two) times daily as needed. (Patient taking differently: Take 50 mg by mouth 2 (two) times daily. )  . ranitidine (ZANTAC) 150 MG tablet Take 150 mg by mouth as needed for heartburn. Reported on 01/07/2016   No current facility-administered medications on file prior to visit.   Allergies  Allergen Reactions  . Amoxicillin   . Clindamycin/Lincomycin   . Doxycycline   . Levaquin [Levofloxacin In D5w]   . Mirabegron Nausea And Vomiting  . Nsaids Other (See Comments)    GI upset  . Phenergan [Promethazine Hcl]   . Singulair [Montelukast]   . Vicodin [Hydrocodone-Acetaminophen]   . Zocor [Simvastatin]   . Zoloft   [Sertraline] Other (See Comments)  . Ampicillin Rash  . Bactrim [Sulfamethoxazole-Trimethoprim] Rash  . Penicillins Rash    Ampicillin, Clindamycin, Doxycycline Levaquin-severe headache, muscle and joint aches, weak   Past Surgical History  Procedure Laterality Date  . Carpal tunnel release    . Tonsillectomy    . Abdominal hysterectomy    . Temporal artery biopsy / ligation    . Adenoidectomy    . R/o lymph nodes; right axilla    . Arthroscopic knee surgery    . Cardiac catheterization      [facility]  . Appendectomy    . Cholecystectomy     Social History   Social History  . Marital Status: Widowed    Spouse Name: N/A  . Number of Children: N/A  . Years of Education: N/A   Occupational History  . Not on file.   Social History Main Topics  . Smoking status: Never Smoker   . Smokeless tobacco: Never Used  . Alcohol Use: No  . Drug Use: No  . Sexual Activity: Not on file   Other Topics Concern  . Not on file  Social History Narrative   Family History  Problem Relation Age of Onset  . Heart disease Mother   . Depression Mother   . Colon polyps Mother   . Heart disease Father   . Heart disease Brother   . Heart disease Brother         Objective:   Physical Exam  Constitutional: She appears well-developed and well-nourished.  Cardiovascular: Normal rate and regular rhythm.   Murmur ( 2+ systolic ejection murmur) heard. Pulmonary/Chest: Effort normal and breath sounds normal. No respiratory distress.  Psychiatric: She has a normal mood and affect. Her behavior is normal.  BP 118/54 mmHg  Pulse 68  Temp(Src) 98.2 F (36.8 C) (Oral)  Resp 16  Wt 186 lb (84.369 kg)      Assessment & Plan:  1. Essential hypertension Not to goal. Decrease Lisinopril as below for hypotension. FU 2 weeks. Will need to perform pre-op exam at that time. - hydrochlorothiazide (HYDRODIURIL) 12.5 MG tablet; Take 1 tablet (12.5 mg total) by mouth daily.  Dispense: 30 tablet; Refill:  1 - lisinopril (PRINIVIL,ZESTRIL) 5 MG tablet; Take 1 tablet (5 mg total) by mouth daily.  Dispense: 30 tablet; Refill: 1  2. Narrowing of intervertebral disc space Will increase Gabapentin as below. - gabapentin (NEURONTIN) 300 MG capsule; 1 capsule q morning, and 2 capsules qhs  Dispense: 270 capsule; Refill: 3  3. Systolic ejection murmur Found in Dr. Etta Quill report. Also heard on physical exam. Workup was normal.  Patient seen and examined by Jerrell Belfast, MD, and note scribed by Renaldo Fiddler, [place].  I have reviewed the document for accuracy and completeness and I agree with above. Jerrell Belfast, MD   Margarita Rana, MD

## 2016-01-09 ENCOUNTER — Encounter: Payer: Private Health Insurance - Indemnity | Admitting: Physical Therapy

## 2016-01-16 ENCOUNTER — Encounter
Admission: RE | Admit: 2016-01-16 | Discharge: 2016-01-16 | Disposition: A | Discharge status: Home / Self Care | Payer: Medicare Other | Source: Ambulatory Visit | Attending: Orthopedic Surgery | Admitting: Orthopedic Surgery

## 2016-01-16 DIAGNOSIS — E785 Hyperlipidemia, unspecified: Secondary | ICD-10-CM | POA: Insufficient documentation

## 2016-01-16 DIAGNOSIS — M1711 Unilateral primary osteoarthritis, right knee: Secondary | ICD-10-CM | POA: Diagnosis not present

## 2016-01-16 DIAGNOSIS — I10 Essential (primary) hypertension: Secondary | ICD-10-CM | POA: Diagnosis not present

## 2016-01-16 DIAGNOSIS — K219 Gastro-esophageal reflux disease without esophagitis: Secondary | ICD-10-CM | POA: Diagnosis not present

## 2016-01-16 DIAGNOSIS — D649 Anemia, unspecified: Secondary | ICD-10-CM | POA: Insufficient documentation

## 2016-01-16 DIAGNOSIS — Z01812 Encounter for preprocedural laboratory examination: Secondary | ICD-10-CM | POA: Diagnosis not present

## 2016-01-16 HISTORY — DX: Syncope and collapse: R55

## 2016-01-16 HISTORY — DX: Dorsalgia, unspecified: M54.9

## 2016-01-16 LAB — TYPE AND SCREEN
ABO/RH(D): O POS
ANTIBODY SCREEN: NEGATIVE

## 2016-01-16 LAB — CBC
HCT: 38 % (ref 35.0–47.0)
Hemoglobin: 13.1 g/dL (ref 12.0–16.0)
MCH: 30.4 pg (ref 26.0–34.0)
MCHC: 34.4 g/dL (ref 32.0–36.0)
MCV: 88.3 fL (ref 80.0–100.0)
PLATELETS: 201 10*3/uL (ref 150–440)
RBC: 4.3 MIL/uL (ref 3.80–5.20)
RDW: 13.8 % (ref 11.5–14.5)
WBC: 6.2 10*3/uL (ref 3.6–11.0)

## 2016-01-16 LAB — BASIC METABOLIC PANEL
ANION GAP: 6 (ref 5–15)
BUN: 20 mg/dL (ref 6–20)
CO2: 26 mmol/L (ref 22–32)
Calcium: 9.3 mg/dL (ref 8.9–10.3)
Chloride: 104 mmol/L (ref 101–111)
Creatinine, Ser: 0.68 mg/dL (ref 0.44–1.00)
Glucose, Bld: 107 mg/dL — ABNORMAL HIGH (ref 65–99)
POTASSIUM: 3.9 mmol/L (ref 3.5–5.1)
SODIUM: 136 mmol/L (ref 135–145)

## 2016-01-16 LAB — URINALYSIS COMPLETE WITH MICROSCOPIC (ARMC ONLY)
Bacteria, UA: NONE SEEN
Bilirubin Urine: NEGATIVE
Glucose, UA: NEGATIVE mg/dL
Hgb urine dipstick: NEGATIVE
KETONES UR: NEGATIVE mg/dL
Leukocytes, UA: NEGATIVE
Nitrite: NEGATIVE
PROTEIN: NEGATIVE mg/dL
Specific Gravity, Urine: 1.008 (ref 1.005–1.030)
pH: 6 (ref 5.0–8.0)

## 2016-01-16 LAB — SURGICAL PCR SCREEN
MRSA, PCR: POSITIVE — AB
STAPHYLOCOCCUS AUREUS: POSITIVE — AB

## 2016-01-16 LAB — PROTIME-INR
INR: 1
Prothrombin Time: 13.4 seconds (ref 11.4–15.0)

## 2016-01-16 LAB — ABO/RH: ABO/RH(D): O POS

## 2016-01-16 LAB — APTT: APTT: 31 s (ref 24–36)

## 2016-01-16 LAB — SEDIMENTATION RATE: SED RATE: 21 mm/h (ref 0–30)

## 2016-01-16 NOTE — Pre-Procedure Instructions (Signed)
Call and faxed positive MRSA result to Dr Clydell Hakim office.

## 2016-01-16 NOTE — Pre-Procedure Instructions (Signed)
NM myocardial perfusion SPECT multiple (stress and rest) (12/31/2015 2:00 PM)  Impressions  Normal myocardial perfusion scan no evidence of stress-induced   myocardial ischemia ejection fraction 74% conclusion negative scan          NM myocardial perfusion SPECT multiple (stress and rest) (12/31/2015 2:00 PM)  Narrative  Procedure: Pharmacologic Myocardial Perfusion Imaging  ONE day procedure    Indication: SOB (shortness of breath) - Plan: NM myocardial perfusion   SPECT multiple (stress and rest), ECG stress test only    Pre-operative examination - Plan: NM myocardial perfusion SPECT multiple   (stress and rest), ECG stress test only  Ordering Physician:     Dr. Lujean Amel      Clinical History:  82 y.o. year old female recent anginal symptoms preop  Vitals: Height: 60 in Weight: 185 lb  Cardiac risk factors include:    Hyperlipidemia and HTN       Procedure:    Pharmacologic stress testing was performed with Regadenoson using a single   use 0.4mg /73ml (0.08 mg/ml) prefilled syringe intravenously infused as a   bolus dose. The stress test was stopped due to Infusion completion.Blood   pressure response was normal.    Rest HR: 58bpm  Rest BP: 122/89mmHg  Max HR: 83bpm  Min BP: 120/37mmHg    Stress Test Administered by: Oswald Hillock, CMA    ECG Interpretation:  Rest [id number] sinus rhythm, none  Stress [id number] sinus rhythm,   Recovery [id number] sinus rhythm  ECG Interpretation:non-diagnostic due to pharmacologic testing.      Administrations This Visit  regadenoson (LEXISCAN) 0.4 mg/5 mL inj syringe 0.4 mg  Admin Date Action Dose Route Administered By         12/31/2015 Given 0.4 mg Intravenous Kingsley Callander, CNMT          technetium Tc8m sestamibi (CARDIOLITE) injection [id number] millicurie  Admin Date Action Dose Route Administered By          123456 Given [id number] millicurie Intravenous Kingsley Callander, CNMT          technetium Tc71m sestamibi (CARDIOLITE) injection 123456 millicurie  Admin Date Action Dose Route Administered By         123456 Given 123456 millicurie Intravenous Kingsley Callander, CNMT                Gated post-stress perfusion imaging was performed 30 minutes after stress.   Rest images were performed 30 minutes after injection.    Gated LV Analysis:   TID Ratio: 1.10    LVEF= 74 %    FINDINGS:  Regional wall motion:reveals normal myocardial thickening and wall   motion.  The overall quality of the study is good.  Artifacts noted: no  Left ventricular cavity: normal.    Perfusion Analysis:SPECT images demonstrate homogeneous tracer   distribution throughout the myocardium.

## 2016-01-16 NOTE — Pre-Procedure Instructions (Signed)
Called Dr Clydell Hakim office regarding allergy to clindamycin.  Hope to call back.

## 2016-01-16 NOTE — Patient Instructions (Addendum)
  Your procedure is scheduled on: 01/30/16 Wed Report to Day Surgery.2nd floor medical mall To find out your arrival time please call [phone] between 1PM - 3PM on 01/29/16 [place].  Remember: Instructions that are not followed completely may result in serious medical risk, up to and including death, or upon the discretion of your surgeon and anesthesiologist your surgery may need to be rescheduled.    _x___ 1. Do not eat food or drink liquids after midnight. No gum chewing or hard candies.     ____ 2. No Alcohol for 24 hours before or after surgery.   ____ 3. Bring all medications with you on the day of surgery if instructed.    __x__ 4. Notify your doctor if there is any change in your medical condition     (cold, fever, infections).     Do not wear jewelry, make-up, hairpins, clips or nail polish.  Do not wear lotions, powders, or perfumes. You may wear deodorant.  Do not shave 48 hours prior to surgery. Men may shave face and neck.  Do not bring valuables to the hospital.    [facility] is not responsible for any belongings or valuables.               Contacts, dentures or bridgework may not be worn into surgery.  Leave your suitcase in the car. After surgery it may be brought to your room.  For patients admitted to the hospital, discharge time is determined by your                treatment team.   Patients discharged the day of surgery will not be allowed to drive home.   Please read over the following fact sheets that you were given:   MRSA Information   _x___ Take these medicines the morning of surgery with A SIP OF WATER:    1. citalopram (CELEXA) 10 MG tablet  2. lisinopril (PRINIVIL,ZESTRIL) 5 MG tablet  3. gabapentin (NEURONTIN) 300 MG capsule  4.omeprazole (PRILOSEC) 20 MG capsule  5.  6.  ____ Fleet Enema (as directed)   __x__ Use CHG Soap as directed  ____ Use inhalers on the day of surgery  ____ Stop metformin 2 days prior to surgery    ____ Take 1/2 of  usual insulin dose the night before surgery and none on the morning of surgery.   _x___ Stop Coumadin/Plavix/aspirin on stop aspirin 1 week before surgery  ____ Stop Anti-inflammatories on    __x__ Stop supplements until after surgery.  Stop fish oil 1 week before surgery  ____ Bring C-Pap to the hospital.

## 2016-01-16 NOTE — Pre-Procedure Instructions (Signed)
Ref Range   LV Ejection Fraction (%) 55   Aortic Valve Stenosis Grade none   Aortic Valve Regurgitation Grade mild   Aortic Valve Max Velocity (m/s) 1.2 m/sec  Mitral Valve Stenosis Grade none   Mitral Valve Regurgitation Grade mild   Tricuspid Valve Regurgitation Grade mild   Tricuspid Valve Regurgitation Max Velocity (m/s) 2.5 m/sec  Right Ventricle Systolic Pressure (mmHg) XX123456 mmHg  LV End Diastolic Diameter (cm) 4.1 cm  LV End Systolic Diameter (cm) 2.6 cm  LV Septum Wall Thickness (cm) 1.3 cm  LV Posterior Wall Thickness (cm) 0.94 cm  Left Atrium Diameter (cm) 3.8 cm   Echocardiogram 2D complete (12/31/2015 9:47 AM)  Specimen Performing Laboratory   [facility] OTHER ORDERS    Echocardiogram 2D complete (12/31/2015 9:47 AM)  Narrative  CARDIOLOGY [facility], [facility] [id number]  [name] #: [id number]  [address] [name], [place] [address] Date: 12/31/2015 08:51 AM   Adult Female Age: 82 yrs  ECHOCARDIOGRAM REPORTOutpatient  STUDY:CHEST WALL TAPE: KC::KCWC   ECHO:Yes DOPPLER:YesFILE: MD1:CALLWOOD, DWAYNE DENNIS  COLOR:YesCONTRAST:Yes MACHINE:[place]  RV BIOPSY:No 3D:No SOUND QLTY:Moderate   MEDIUM:None  ___________________________________________________________________________________________     HISTORY:Chest pain  REASON:Assess, LV function  INDICATION:SOB (shortness of breath) [R06.02 (ICD-10-CM)]  ___________________________________________________________________________________________    ECHOCARDIOGRAPHIC MEASUREMENTS  2D  DIMENSIONS  AORTA ValuesNormal RangeMAIN PAValuesNormal Range  Annulus:1.7 cm[2.1 - 2.5]PA Main:nm* [1.5 - 2.1]  Aorta Sin:nm* [2.7 - 3.3] RIGHT VENTRICLE  ST Junction:nm* [2.3 - 2.9]RV Base:nm* [ < 4.2]  Asc.Aorta:nm* [2.3 - 3.1] RV Mid:nm* [ < 3.5]  LEFT VENTRICLERV Length:nm* [ < 8.6]  LVIDd:4.1 cm[3.9 - 5.3] INFERIOR VENA CAVA  LVIDs:2.6 cmMax. IVC:nm* [ <= 2.1]   FS:35.9 %[> 25]Min. IVC:nm*  SWT:1.3 cm[0.5 - 0.9] ------------------  PWT:0.94 cm [0.5 - 0.9] nm* - not measured  LEFT ATRIUM  LA Diam:3.8 cm[2.7 - 3.8]  LA A4C Area:nm* [ < 20]  LA Volume:nm* [22 - 52]  ___________________________________________________________________________________________    ECHOCARDIOGRAPHIC DESCRIPTIONS    AORTIC ROOT  Size:Normal  Dissection:INDETERM FOR DISSECTION    AORTIC VALVE  Leaflets:Tricuspid Morphology:Normal  Mobility:Fully mobile    LEFT VENTRICLE  Size:NormalAnterior:Normal  Contraction:Normal Lateral:Normal  Closest EF:>55% (Estimated)Septal:Normal   LV Masses:No Masses Apical:Normal   Inferior:Normal  Posterior:Normal  Dias.FxClass:N/A   [place]:[id number] LVH    MITRAL VALVE  Leaflets:NormalMobility:Fully mobile  Morphology:THICKENED LEAFLET(S)    LEFT ATRIUM   Size:Normal LA Masses:No masses    MAIN PA  Size:Normal    PULMONIC VALVE  Leaflets:N/A Morphology:Normal  Mobility:Fully mobile    RIGHT VENTRICLE   RV Masses:No Masses Size:Normal   Free Wall:Normal Contraction:Normal    TRICUSPID VALVE  Leaflets:NormalMobility:Fully mobile  Morphology:Normal    RIGHT ATRIUM  Size:NormalRA Other:None   RA Mass:No masses    PERICARDIUM   Fluid:No effusion    INFERIOR VENACAVA  Size:Normal Normal respiratory collapse    ____________________________________________________________________    DOPPLER ECHO and OTHER SPECIAL PROCEDURES   Aortic:MILD AR No AS  124.0 cm/sec peak vel 6.2 mmHg peak grad     Mitral:MILD MR No MS  3.1 cm^2 by DOPPLER  MV Inflow E Vel=79.5 cm/sec MV Annulus E'Vel=5.5 cm/sec  E/E'Ratio=14.5    Tricuspid:MILD TR No TS  246.5 cm/sec peak TR vel34.3 mmHg peak RV pressure    Pulmonary:No PR No PS  75.2 cm/sec peak vel2.3 mmHg peak grad        ___________________________________________________________________________________________  INTERPRETATION  NORMAL LEFT VENTRICULAR SYSTOLIC FUNCTION WITH  MILD LVH  MILD VALVULAR REGURGITATION (See above)  NO VALVULAR STENOSIS  EF >55%      ___________________________________________________________________________________________  Electronically signed by: Lujean Amel, MD on 01/01/2016 11:14 AM  Performed By: Maurilio Lovely, [place]  Ordering Physician: Lujean Amel     ___________________________________________________________________________________________

## 2016-01-18 LAB — URINE CULTURE

## 2016-01-18 NOTE — Pre-Procedure Instructions (Signed)
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  Helen Ramsey TO BE CALLED AND INFORMED THAT SURGEON IS WANTING HER TO COME BACK IN FOR LAB WORK IN PAT

## 2016-01-21 ENCOUNTER — Ambulatory Visit (INDEPENDENT_AMBULATORY_CARE_PROVIDER_SITE_OTHER): Payer: Medicare Other | Admitting: Family Medicine

## 2016-01-21 ENCOUNTER — Encounter
Admission: RE | Admit: 2016-01-21 | Discharge: 2016-01-21 | Disposition: A | Discharge status: Home / Self Care | Payer: Medicare Other | Source: Ambulatory Visit | Attending: Orthopedic Surgery | Admitting: Orthopedic Surgery

## 2016-01-21 ENCOUNTER — Encounter: Payer: Self-pay | Admitting: Family Medicine

## 2016-01-21 VITALS — BP 128/68 | HR 72 | Temp 98.4°F | Resp 16 | Wt 187.0 lb

## 2016-01-21 DIAGNOSIS — I10 Essential (primary) hypertension: Secondary | ICD-10-CM

## 2016-01-21 DIAGNOSIS — M1990 Unspecified osteoarthritis, unspecified site: Secondary | ICD-10-CM | POA: Diagnosis not present

## 2016-01-21 DIAGNOSIS — Z01812 Encounter for preprocedural laboratory examination: Secondary | ICD-10-CM | POA: Diagnosis not present

## 2016-01-21 DIAGNOSIS — F329 Major depressive disorder, single episode, unspecified: Secondary | ICD-10-CM | POA: Diagnosis not present

## 2016-01-21 DIAGNOSIS — F32A Depression, unspecified: Secondary | ICD-10-CM

## 2016-01-21 NOTE — Progress Notes (Signed)
Patient ID: Helen Ramsey, female   DOB: 04-05-1933, 82 y.o.   MRN: [id number]       Patient: Helen Ramsey Female    DOB: 06/11/1933   82 y.o.   MRN: [id number] Visit Date: 01/21/2016  Today's Provider: Margarita Rana, MD   Chief Complaint  Patient presents with  . Hypotension    2 week F/U   Subjective:    HPI Hypotension, Follow up:  Patient comes in today to follow up on low blood pressure. On last visit, Lisinopril was decreased to 5mg  daily. She reports that she has been tolerating medication changes well. Has not had a much dizziness.  Still has headaches but not changed from baseline. Takes Tramadol as needed.  She reports that her BP ranges in the 120s/60s when checked at home.  Does have surgery coming up. Also Dr. Nicolasa Ducking changed her  medication dose.  Also had  Nasal swab at hospital.  Has had some procedures done at the neurologist.  Is concerned about this.      Allergies  Allergen Reactions  . Amoxicillin   . Clindamycin/Lincomycin   . Doxycycline   . Levaquin [Levofloxacin In D5w]   . Mirabegron Nausea And Vomiting  . Nsaids Other (See Comments)    GI upset  . Phenergan [Promethazine Hcl]   . Singulair [Montelukast]   . Vicodin [Hydrocodone-Acetaminophen]   . Zocor [Simvastatin]   . Zoloft  [Sertraline] Other (See Comments)  . Ampicillin Rash  . Bactrim [Sulfamethoxazole-Trimethoprim] Rash  . Penicillins Rash    Ampicillin, Clindamycin, Doxycycline Levaquin-severe headache, muscle and joint aches, weak   Previous Medications   ACETAMINOPHEN (TYLENOL) 500 MG TABLET    Take 500 mg by mouth 3 (three) times daily as needed.   ALENDRONATE (FOSAMAX) 70 MG TABLET    Take 70 mg by mouth once a week.    ASPIRIN 81 MG TABLET    Take 81 mg by mouth daily.   ATORVASTATIN (LIPITOR) 20 MG TABLET    Take 1 tablet (20 mg total) by mouth daily.   AZELASTINE (ASTELIN) 0.1 % NASAL SPRAY    Place 1 spray into both nostrils as needed for rhinitis. Use in each nostril as  directed   CALCIUM CARBONATE (TUMS - DOSED IN MG ELEMENTAL CALCIUM) 500 MG CHEWABLE TABLET    Chew 1 tablet by mouth daily.   CALCIUM CARBONATE-VITAMIN D 600-400 MG-UNIT TABLET    Take 1 tablet by mouth 2 (two) times daily.   CETIRIZINE (ZYRTEC) 10 MG TABLET    Take 10 mg by mouth daily.    CHOLECALCIFEROL (VITAMIN D) 1000 UNITS TABLET    Take 1 tablet (1,000 Units total) by mouth daily.   CITALOPRAM (CELEXA) 10 MG TABLET       DOCUSATE SODIUM (COLACE) 100 MG CAPSULE    Take 100 mg by mouth as needed for mild constipation.   FLUTICASONE (FLONASE) 50 MCG/ACT NASAL SPRAY    Place 2 sprays into both nostrils daily.   GABAPENTIN (NEURONTIN) 300 MG CAPSULE    1 capsule q morning, and 2 capsules qhs   HYDROCHLOROTHIAZIDE (HYDRODIURIL) 12.5 MG TABLET    Take 1 tablet (12.5 mg total) by mouth daily.   LISINOPRIL (PRINIVIL,ZESTRIL) 5 MG TABLET    Take 1 tablet (5 mg total) by mouth daily.   LOPERAMIDE (IMODIUM) 2 MG CAPSULE    Take 2 mg by mouth as needed for diarrhea or loose stools.   LORATADINE (CLARITIN) 10 MG TABLET  Take 10 mg by mouth daily.   MAGNESIUM OXIDE, ANTACID, 500 MG CAPS    Take 1 tablet by mouth daily with supper.   MELATONIN 1 MG TABS    Take 1 tablet by mouth at bedtime.   OMEGA-3 FATTY ACIDS (FISH OIL) 1200 MG CAPS    Take 2 capsules by mouth daily.    OMEPRAZOLE (PRILOSEC) 20 MG CAPSULE    Take 1 capsule (20 mg total) by mouth daily.   PHENAZOPYRIDINE HCL (AZO TABS PO)    Take 1 tablet by mouth as needed.   POTASSIUM GLUCONATE PO    Take 1 tablet by mouth daily with supper.   SIMETHICONE (GAS RELIEF PO)    Take by mouth.   SODIUM CHLORIDE (OCEAN) 0.65 % NASAL SPRAY    1 spray by Nasal route once daily.   TRAMADOL (ULTRAM) 50 MG TABLET    Take 1 tablet (50 mg total) by mouth 2 (two) times daily as needed.    Review of Systems  Constitutional: Negative for activity change.  Respiratory: Negative.  Negative for cough.   Cardiovascular: Negative.  Negative for chest pain.    Neurological: Positive for weakness and headaches. Negative for light-headedness.  Psychiatric/Behavioral:       Followed by Dr. Nicolasa Ducking.      Social History  Substance Use Topics  . Smoking status: Never Smoker   . Smokeless tobacco: Never Used  . Alcohol Use: No   Objective:   BP 128/68 mmHg  Pulse 72  Temp(Src) 98.4 F (36.9 C)  Resp 16  Wt 187 lb (84.823 kg)  Physical Exam  Constitutional: She is oriented to person, place, and time. She appears well-developed and well-nourished.  Cardiovascular: Normal rate and regular rhythm.   Murmur heard. Neurological: She is alert and oriented to person, place, and time.  Psychiatric: She has a normal mood and affect. Her behavior is normal. Judgment and thought content normal.      Assessment & Plan:     1. Essential hypertension Condition is stable. Please continue current medication and  plan of care as noted.    2. Arthritis Condition is stable. Please continue current medication and  plan of care as noted.    3. Clinical depression Improved on Celexa and off Cymbalta. Continue current medication.       Margarita Rana, MD  [facility]

## 2016-01-22 DIAGNOSIS — F411 Generalized anxiety disorder: Secondary | ICD-10-CM | POA: Diagnosis not present

## 2016-01-22 DIAGNOSIS — F331 Major depressive disorder, recurrent, moderate: Secondary | ICD-10-CM | POA: Diagnosis not present

## 2016-01-22 LAB — IGE: IgE (Immunoglobulin E), Serum: 28 IU/mL (ref 0–100)

## 2016-01-24 NOTE — Pre-Procedure Instructions (Signed)
SPOKE WITH BETSIE AT DR CALLWOODS RE NEED STRESS/ECHO/CLEARANCE NOTE

## 2016-01-30 ENCOUNTER — Inpatient Hospital Stay: Payer: Medicare Other | Admitting: Certified Registered"

## 2016-01-30 ENCOUNTER — Encounter
Admission: RE | Disposition: A | Discharge status: Skilled Nursing Facility | Payer: Self-pay | Source: Ambulatory Visit | Attending: Orthopedic Surgery

## 2016-01-30 ENCOUNTER — Inpatient Hospital Stay
Admission: RE | Admit: 2016-01-30 | Discharge: 2016-02-02 | DRG: 470 | Disposition: A | Discharge status: Skilled Nursing Facility | Payer: Medicare Other | Source: Ambulatory Visit | Attending: Orthopedic Surgery | Admitting: Orthopedic Surgery

## 2016-01-30 ENCOUNTER — Encounter: Payer: Self-pay | Admitting: Orthopedic Surgery

## 2016-01-30 ENCOUNTER — Inpatient Hospital Stay: Payer: Medicare Other

## 2016-01-30 DIAGNOSIS — E785 Hyperlipidemia, unspecified: Secondary | ICD-10-CM | POA: Diagnosis present

## 2016-01-30 DIAGNOSIS — D649 Anemia, unspecified: Secondary | ICD-10-CM | POA: Diagnosis not present

## 2016-01-30 DIAGNOSIS — Z881 Allergy status to other antibiotic agents status: Secondary | ICD-10-CM

## 2016-01-30 DIAGNOSIS — G4730 Sleep apnea, unspecified: Secondary | ICD-10-CM | POA: Diagnosis present

## 2016-01-30 DIAGNOSIS — I10 Essential (primary) hypertension: Secondary | ICD-10-CM | POA: Diagnosis present

## 2016-01-30 DIAGNOSIS — Z880 Allergy status to penicillin: Secondary | ICD-10-CM | POA: Diagnosis not present

## 2016-01-30 DIAGNOSIS — M1711 Unilateral primary osteoarthritis, right knee: Secondary | ICD-10-CM | POA: Diagnosis not present

## 2016-01-30 DIAGNOSIS — Z888 Allergy status to other drugs, medicaments and biological substances status: Secondary | ICD-10-CM | POA: Diagnosis not present

## 2016-01-30 DIAGNOSIS — L03115 Cellulitis of right lower limb: Secondary | ICD-10-CM | POA: Diagnosis not present

## 2016-01-30 DIAGNOSIS — K219 Gastro-esophageal reflux disease without esophagitis: Secondary | ICD-10-CM | POA: Diagnosis present

## 2016-01-30 DIAGNOSIS — F329 Major depressive disorder, single episode, unspecified: Secondary | ICD-10-CM | POA: Diagnosis present

## 2016-01-30 DIAGNOSIS — Z96651 Presence of right artificial knee joint: Secondary | ICD-10-CM | POA: Diagnosis not present

## 2016-01-30 DIAGNOSIS — M810 Age-related osteoporosis without current pathological fracture: Secondary | ICD-10-CM | POA: Diagnosis present

## 2016-01-30 DIAGNOSIS — I700 Atherosclerosis of aorta: Secondary | ICD-10-CM | POA: Diagnosis present

## 2016-01-30 DIAGNOSIS — T148 Other injury of unspecified body region: Secondary | ICD-10-CM | POA: Diagnosis not present

## 2016-01-30 DIAGNOSIS — E784 Other hyperlipidemia: Secondary | ICD-10-CM | POA: Diagnosis not present

## 2016-01-30 DIAGNOSIS — G2581 Restless legs syndrome: Secondary | ICD-10-CM | POA: Diagnosis present

## 2016-01-30 DIAGNOSIS — R112 Nausea with vomiting, unspecified: Secondary | ICD-10-CM | POA: Diagnosis not present

## 2016-01-30 DIAGNOSIS — I739 Peripheral vascular disease, unspecified: Secondary | ICD-10-CM | POA: Diagnosis not present

## 2016-01-30 DIAGNOSIS — E559 Vitamin D deficiency, unspecified: Secondary | ICD-10-CM | POA: Diagnosis not present

## 2016-01-30 DIAGNOSIS — Z79899 Other long term (current) drug therapy: Secondary | ICD-10-CM | POA: Diagnosis not present

## 2016-01-30 DIAGNOSIS — R262 Difficulty in walking, not elsewhere classified: Secondary | ICD-10-CM | POA: Diagnosis not present

## 2016-01-30 DIAGNOSIS — J309 Allergic rhinitis, unspecified: Secondary | ICD-10-CM | POA: Diagnosis present

## 2016-01-30 DIAGNOSIS — M179 Osteoarthritis of knee, unspecified: Secondary | ICD-10-CM | POA: Diagnosis not present

## 2016-01-30 DIAGNOSIS — Z9181 History of falling: Secondary | ICD-10-CM | POA: Diagnosis not present

## 2016-01-30 DIAGNOSIS — M6281 Muscle weakness (generalized): Secondary | ICD-10-CM | POA: Diagnosis not present

## 2016-01-30 DIAGNOSIS — Z471 Aftercare following joint replacement surgery: Secondary | ICD-10-CM | POA: Diagnosis not present

## 2016-01-30 DIAGNOSIS — M5136 Other intervertebral disc degeneration, lumbar region: Secondary | ICD-10-CM | POA: Diagnosis present

## 2016-01-30 DIAGNOSIS — F419 Anxiety disorder, unspecified: Secondary | ICD-10-CM | POA: Diagnosis not present

## 2016-01-30 DIAGNOSIS — Z885 Allergy status to narcotic agent status: Secondary | ICD-10-CM

## 2016-01-30 DIAGNOSIS — R279 Unspecified lack of coordination: Secondary | ICD-10-CM | POA: Diagnosis not present

## 2016-01-30 DIAGNOSIS — Z96659 Presence of unspecified artificial knee joint: Secondary | ICD-10-CM

## 2016-01-30 DIAGNOSIS — T814XXD Infection following a procedure, subsequent encounter: Secondary | ICD-10-CM | POA: Diagnosis not present

## 2016-01-30 HISTORY — PX: KNEE ARTHROPLASTY: SHX992

## 2016-01-30 SURGERY — ARTHROPLASTY, KNEE, TOTAL, USING IMAGELESS COMPUTER-ASSISTED NAVIGATION
Anesthesia: General | Site: Knee | Laterality: Right | Wound class: Clean

## 2016-01-30 MED ORDER — SODIUM CHLORIDE 0.9 % IV SOLN
INTRAVENOUS | Status: DC | PRN
  Administered 2016-01-30: 60 mL

## 2016-01-30 MED ORDER — MENTHOL 3 MG MT LOZG: 1.0000 | LOZENGE | OROMUCOSAL | Status: DC | PRN

## 2016-01-30 MED ORDER — TRANEXAMIC ACID 1000 MG/10ML IV SOLN
1000.0000 mg | INTRAVENOUS | Status: DC | PRN
  Administered 2016-01-30: 1000 mg via INTRAVENOUS

## 2016-01-30 MED ORDER — BUPIVACAINE LIPOSOME 1.3 % IJ SUSP
INTRAMUSCULAR | Status: AC
  Filled 2016-01-30: qty 20

## 2016-01-30 MED ORDER — VANCOMYCIN HCL IN DEXTROSE 1-5 GM/200ML-% IV SOLN
1000.0000 mg | Freq: Once | INTRAVENOUS | Status: DC
  Administered 2016-01-30: 1000 mg via INTRAVENOUS

## 2016-01-30 MED ORDER — NEOMYCIN-POLYMYXIN B GU 40-200000 IR SOLN
Status: AC
  Filled 2016-01-30: qty 20

## 2016-01-30 MED ORDER — GABAPENTIN 300 MG PO CAPS
300.0000 mg | ORAL_CAPSULE | Freq: Every day | ORAL | Status: DC
  Administered 2016-01-31 – 2016-02-02 (×3): 300 mg via ORAL
  Filled 2016-01-30 (×3): qty 1

## 2016-01-30 MED ORDER — CITALOPRAM HYDROBROMIDE 20 MG PO TABS: 10.0000 mg | ORAL_TABLET | Freq: Every day | ORAL | Status: DC

## 2016-01-30 MED ORDER — ONDANSETRON HCL 4 MG/2ML IJ SOLN: 4.0000 mg | Freq: Once | INTRAMUSCULAR | Status: DC | PRN

## 2016-01-30 MED ORDER — POTASSIUM GLUCONATE 550 (90 K) MG PO TABS: ORAL_TABLET | Freq: Every day | ORAL | Status: DC

## 2016-01-30 MED ORDER — SIMETHICONE 80 MG PO CHEW: 160.0000 mg | CHEWABLE_TABLET | Freq: Four times a day (QID) | ORAL | Status: DC | PRN

## 2016-01-30 MED ORDER — SODIUM CHLORIDE 0.9 % IV SOLN
INTRAVENOUS | Status: DC
  Administered 2016-01-30 – 2016-01-31 (×2): via INTRAVENOUS

## 2016-01-30 MED ORDER — METAXALONE 800 MG PO TABS
800.0000 mg | ORAL_TABLET | Freq: Three times a day (TID) | ORAL | Status: DC | PRN
  Administered 2016-01-30: 800 mg via ORAL
  Filled 2016-01-30 (×2): qty 1

## 2016-01-30 MED ORDER — TRANEXAMIC ACID 1000 MG/10ML IV SOLN: 1000.0000 mg | INTRAVENOUS | Status: DC | PRN

## 2016-01-30 MED ORDER — OMEGA-3-ACID ETHYL ESTERS 1 G PO CAPS
2.0000 g | ORAL_CAPSULE | Freq: Every day | ORAL | Status: DC
  Administered 2016-01-31 – 2016-02-02 (×3): 2 g via ORAL
  Filled 2016-01-30 (×3): qty 2

## 2016-01-30 MED ORDER — TRANEXAMIC ACID 1000 MG/10ML IV SOLN
1000.0000 mg | Freq: Once | INTRAVENOUS | Status: AC
  Administered 2016-01-30: 1000 mg via INTRAVENOUS
  Filled 2016-01-30: qty 10

## 2016-01-30 MED ORDER — SENNOSIDES-DOCUSATE SODIUM 8.6-50 MG PO TABS
1.0000 | ORAL_TABLET | Freq: Two times a day (BID) | ORAL | Status: DC
  Administered 2016-01-30 – 2016-02-02 (×7): 1 via ORAL
  Filled 2016-01-30 (×7): qty 1

## 2016-01-30 MED ORDER — ONDANSETRON HCL 4 MG/2ML IJ SOLN
4.0000 mg | Freq: Four times a day (QID) | INTRAMUSCULAR | Status: DC | PRN
  Administered 2016-01-30 – 2016-01-31 (×2): 4 mg via INTRAVENOUS
  Filled 2016-01-30 (×2): qty 2

## 2016-01-30 MED ORDER — ALUM & MAG HYDROXIDE-SIMETH 200-200-20 MG/5ML PO SUSP: 30.0000 mL | ORAL | Status: DC | PRN

## 2016-01-30 MED ORDER — MUPIROCIN CALCIUM 2 % EX CREA
TOPICAL_CREAM | Freq: Two times a day (BID) | CUTANEOUS | Status: DC
  Administered 2016-01-30 – 2016-01-31 (×3): via TOPICAL
  Administered 2016-02-01: 1 via TOPICAL
  Administered 2016-02-01 – 2016-02-02 (×2): via TOPICAL
  Filled 2016-01-30: qty 15

## 2016-01-30 MED ORDER — PHENOL 1.4 % MT LIQD: 1.0000 | OROMUCOSAL | Status: DC | PRN

## 2016-01-30 MED ORDER — TRAMADOL HCL 50 MG PO TABS
50.0000 mg | ORAL_TABLET | ORAL | Status: DC | PRN
  Administered 2016-01-30: 50 mg via ORAL
  Administered 2016-01-30: 100 mg via ORAL
  Administered 2016-01-31 (×2): 50 mg via ORAL
  Administered 2016-01-31 – 2016-02-02 (×4): 100 mg via ORAL
  Administered 2016-02-02: 50 mg via ORAL
  Filled 2016-01-30: qty 1
  Filled 2016-01-30 (×2): qty 2
  Filled 2016-01-30 (×2): qty 1
  Filled 2016-01-30: qty 2
  Filled 2016-01-30: qty 1
  Filled 2016-01-30 (×2): qty 2

## 2016-01-30 MED ORDER — MIDAZOLAM HCL 5 MG/5ML IJ SOLN
INTRAMUSCULAR | Status: DC | PRN
  Administered 2016-01-30: 1 mg via INTRAVENOUS

## 2016-01-30 MED ORDER — SODIUM CHLORIDE 0.65 % NA SOLN
2.0000 | NASAL | Status: DC | PRN
  Filled 2016-01-30: qty 45

## 2016-01-30 MED ORDER — POTASSIUM CHLORIDE CRYS ER 20 MEQ PO TBCR
20.0000 meq | EXTENDED_RELEASE_TABLET | Freq: Every day | ORAL | Status: DC
  Administered 2016-01-30 – 2016-02-01 (×3): 20 meq via ORAL
  Filled 2016-01-30 (×3): qty 1

## 2016-01-30 MED ORDER — ALENDRONATE SODIUM 70 MG PO TABS: 70.0000 mg | ORAL_TABLET | ORAL | Status: DC

## 2016-01-30 MED ORDER — BISACODYL 10 MG RE SUPP: 10.0000 mg | Freq: Every day | RECTAL | Status: DC | PRN

## 2016-01-30 MED ORDER — PROPOFOL 500 MG/50ML IV EMUL
INTRAVENOUS | Status: DC | PRN
  Administered 2016-01-30: 50 ug/kg/min via INTRAVENOUS

## 2016-01-30 MED ORDER — FLEET ENEMA 7-19 GM/118ML RE ENEM: 1.0000 | ENEMA | Freq: Once | RECTAL | Status: DC | PRN

## 2016-01-30 MED ORDER — HYDROCHLOROTHIAZIDE 25 MG PO TABS
12.5000 mg | ORAL_TABLET | Freq: Every day | ORAL | Status: DC
  Administered 2016-01-31 – 2016-02-02 (×3): 12.5 mg via ORAL
  Filled 2016-01-30 (×3): qty 1

## 2016-01-30 MED ORDER — CEFAZOLIN SODIUM-DEXTROSE 2-3 GM-% IV SOLR
INTRAVENOUS | Status: AC
  Administered 2016-01-30: 2 g via INTRAVENOUS
  Filled 2016-01-30: qty 50

## 2016-01-30 MED ORDER — MAGNESIUM HYDROXIDE 400 MG/5ML PO SUSP
30.0000 mL | Freq: Every day | ORAL | Status: DC | PRN
  Administered 2016-02-01 – 2016-02-02 (×2): 30 mL via ORAL
  Filled 2016-01-30 (×2): qty 30

## 2016-01-30 MED ORDER — ACETAMINOPHEN 650 MG RE SUPP: 650.0000 mg | Freq: Four times a day (QID) | RECTAL | Status: DC | PRN

## 2016-01-30 MED ORDER — LISINOPRIL 5 MG PO TABS
5.0000 mg | ORAL_TABLET | Freq: Every day | ORAL | Status: DC
  Administered 2016-01-31 – 2016-02-02 (×3): 5 mg via ORAL
  Filled 2016-01-30 (×3): qty 1

## 2016-01-30 MED ORDER — FERROUS SULFATE 325 (65 FE) MG PO TABS
325.0000 mg | ORAL_TABLET | Freq: Two times a day (BID) | ORAL | Status: DC
  Administered 2016-01-30 – 2016-02-02 (×6): 325 mg via ORAL
  Filled 2016-01-30 (×6): qty 1

## 2016-01-30 MED ORDER — TRANEXAMIC ACID 1000 MG/10ML IV SOLN
1000.0000 mg | INTRAVENOUS | Status: DC
  Filled 2016-01-30: qty 10

## 2016-01-30 MED ORDER — SODIUM CHLORIDE 0.9 % IJ SOLN
INTRAMUSCULAR | Status: AC
  Filled 2016-01-30: qty 50

## 2016-01-30 MED ORDER — ENOXAPARIN SODIUM 30 MG/0.3ML ~~LOC~~ SOLN
30.0000 mg | Freq: Two times a day (BID) | SUBCUTANEOUS | Status: DC
  Administered 2016-01-31 – 2016-02-02 (×5): 30 mg via SUBCUTANEOUS
  Filled 2016-01-30 (×4): qty 0.3

## 2016-01-30 MED ORDER — LACTATED RINGERS IV SOLN
INTRAVENOUS | Status: DC | PRN
  Administered 2016-01-30 (×2): via INTRAVENOUS

## 2016-01-30 MED ORDER — CEFAZOLIN SODIUM-DEXTROSE 2-3 GM-% IV SOLR
2.0000 g | Freq: Four times a day (QID) | INTRAVENOUS | Status: AC
Start: 2016-01-30 — End: 2016-01-31
  Administered 2016-01-30 – 2016-01-31 (×4): 2 g via INTRAVENOUS
  Filled 2016-01-30 (×4): qty 50

## 2016-01-30 MED ORDER — ACETAMINOPHEN 325 MG PO TABS: 650.0000 mg | ORAL_TABLET | Freq: Four times a day (QID) | ORAL | Status: DC | PRN

## 2016-01-30 MED ORDER — NEOMYCIN-POLYMYXIN B GU 40-200000 IR SOLN
Status: DC | PRN
  Administered 2016-01-30: 14 mL

## 2016-01-30 MED ORDER — MAGNESIUM OXIDE 400 (241.3 MG) MG PO TABS
400.0000 mg | ORAL_TABLET | Freq: Every day | ORAL | Status: DC
  Administered 2016-01-30 – 2016-02-01 (×3): 400 mg via ORAL
  Filled 2016-01-30 (×3): qty 1

## 2016-01-30 MED ORDER — ONDANSETRON HCL 4 MG PO TABS: 4.0000 mg | ORAL_TABLET | Freq: Four times a day (QID) | ORAL | Status: DC | PRN

## 2016-01-30 MED ORDER — AZELASTINE HCL 0.1 % NA SOLN
1.0000 | NASAL | Status: DC | PRN
  Filled 2016-01-30: qty 30

## 2016-01-30 MED ORDER — OXYCODONE HCL 5 MG PO TABS
5.0000 mg | ORAL_TABLET | ORAL | Status: DC | PRN
  Administered 2016-01-30: 5 mg via ORAL
  Administered 2016-01-30: 10 mg via ORAL
  Administered 2016-01-31: 5 mg via ORAL
  Administered 2016-01-31: 10 mg via ORAL
  Administered 2016-01-31: 5 mg via ORAL
  Administered 2016-01-31: 10 mg via ORAL
  Administered 2016-02-01 (×2): 5 mg via ORAL
  Filled 2016-01-30 (×4): qty 1
  Filled 2016-01-30: qty 2
  Filled 2016-01-30 (×3): qty 1
  Filled 2016-01-30: qty 2

## 2016-01-30 MED ORDER — FLUTICASONE PROPIONATE 50 MCG/ACT NA SUSP
2.0000 | Freq: Every day | NASAL | Status: DC
  Administered 2016-01-30 – 2016-02-02 (×4): 2 via NASAL
  Filled 2016-01-30: qty 16

## 2016-01-30 MED ORDER — VANCOMYCIN HCL IN DEXTROSE 1-5 GM/200ML-% IV SOLN
INTRAVENOUS | Status: AC
  Administered 2016-01-30: 1000 mg via INTRAVENOUS
  Filled 2016-01-30: qty 200

## 2016-01-30 MED ORDER — ACETAMINOPHEN 10 MG/ML IV SOLN
1000.0000 mg | Freq: Four times a day (QID) | INTRAVENOUS | Status: AC
  Administered 2016-01-30 – 2016-01-31 (×4): 1000 mg via INTRAVENOUS
  Filled 2016-01-30 (×4): qty 100

## 2016-01-30 MED ORDER — PANTOPRAZOLE SODIUM 40 MG PO TBEC
40.0000 mg | DELAYED_RELEASE_TABLET | Freq: Two times a day (BID) | ORAL | Status: DC
  Administered 2016-01-30 – 2016-02-02 (×6): 40 mg via ORAL
  Filled 2016-01-30 (×7): qty 1

## 2016-01-30 MED ORDER — MORPHINE SULFATE (PF) 2 MG/ML IV SOLN
2.0000 mg | INTRAVENOUS | Status: DC | PRN
  Administered 2016-01-30: 2 mg via INTRAVENOUS
  Filled 2016-01-30: qty 1

## 2016-01-30 MED ORDER — CALCIUM CARBONATE ANTACID 500 MG PO CHEW
1.0000 | CHEWABLE_TABLET | Freq: Every day | ORAL | Status: DC
  Administered 2016-02-01 – 2016-02-02 (×2): 200 mg via ORAL
  Filled 2016-01-30 (×3): qty 1

## 2016-01-30 MED ORDER — ACETAMINOPHEN 10 MG/ML IV SOLN
INTRAVENOUS | Status: AC
  Filled 2016-01-30: qty 100

## 2016-01-30 MED ORDER — BUPIVACAINE-EPINEPHRINE (PF) 0.25% -1:200000 IJ SOLN
INTRAMUSCULAR | Status: AC
  Filled 2016-01-30: qty 30

## 2016-01-30 MED ORDER — ATORVASTATIN CALCIUM 20 MG PO TABS
20.0000 mg | ORAL_TABLET | Freq: Every day | ORAL | Status: DC
  Administered 2016-01-31 – 2016-02-02 (×3): 20 mg via ORAL
  Filled 2016-01-30 (×3): qty 1

## 2016-01-30 MED ORDER — LOPERAMIDE HCL 2 MG PO CAPS: 2.0000 mg | ORAL_CAPSULE | ORAL | Status: DC | PRN

## 2016-01-30 MED ORDER — LORATADINE 10 MG PO TABS
10.0000 mg | ORAL_TABLET | Freq: Every day | ORAL | Status: DC
  Administered 2016-01-31 – 2016-02-02 (×3): 10 mg via ORAL
  Filled 2016-01-30 (×3): qty 1

## 2016-01-30 MED ORDER — DIPHENHYDRAMINE HCL 12.5 MG/5ML PO ELIX: 12.5000 mg | ORAL_SOLUTION | ORAL | Status: DC | PRN

## 2016-01-30 MED ORDER — METOCLOPRAMIDE HCL 10 MG PO TABS
10.0000 mg | ORAL_TABLET | Freq: Three times a day (TID) | ORAL | Status: AC
  Administered 2016-01-30 – 2016-02-01 (×8): 10 mg via ORAL
  Filled 2016-01-30 (×9): qty 1

## 2016-01-30 MED ORDER — GABAPENTIN 300 MG PO CAPS
600.0000 mg | ORAL_CAPSULE | Freq: Every day | ORAL | Status: DC
  Administered 2016-01-30 – 2016-02-01 (×3): 600 mg via ORAL
  Filled 2016-01-30 (×3): qty 2

## 2016-01-30 MED ORDER — CALCIUM CARBONATE-VITAMIN D 500-200 MG-UNIT PO TABS
1.0000 | ORAL_TABLET | Freq: Two times a day (BID) | ORAL | Status: DC
  Administered 2016-01-30 – 2016-02-02 (×6): 1 via ORAL
  Filled 2016-01-30 (×6): qty 1

## 2016-01-30 MED ORDER — FENTANYL CITRATE (PF) 100 MCG/2ML IJ SOLN: 25.0000 ug | INTRAMUSCULAR | Status: DC | PRN

## 2016-01-30 MED ORDER — ACETAMINOPHEN 10 MG/ML IV SOLN
INTRAVENOUS | Status: DC | PRN
  Administered 2016-01-30: 1000 mg via INTRAVENOUS

## 2016-01-30 MED ORDER — FENTANYL CITRATE (PF) 100 MCG/2ML IJ SOLN
INTRAMUSCULAR | Status: DC | PRN
  Administered 2016-01-30: 1 ug via INTRAVENOUS

## 2016-01-30 MED ORDER — BUPIVACAINE-EPINEPHRINE 0.25% -1:200000 IJ SOLN
INTRAMUSCULAR | Status: DC | PRN
  Administered 2016-01-30: 30 mL

## 2016-01-30 MED ORDER — VITAMIN D 1000 UNITS PO TABS
1000.0000 [IU] | ORAL_TABLET | Freq: Every day | ORAL | Status: DC
  Administered 2016-01-31 – 2016-02-02 (×3): 1000 [IU] via ORAL
  Filled 2016-01-30 (×3): qty 1

## 2016-01-30 MED ORDER — PHENAZOPYRIDINE HCL 100 MG PO TABS
100.0000 mg | ORAL_TABLET | ORAL | Status: DC | PRN
  Filled 2016-01-30: qty 1

## 2016-01-30 MED ORDER — LORATADINE 10 MG PO TABS: 10.0000 mg | ORAL_TABLET | Freq: Every day | ORAL | Status: DC

## 2016-01-30 MED ORDER — LACTATED RINGERS IV SOLN
INTRAVENOUS | Status: DC
  Administered 2016-01-30: 10:00:00 via INTRAVENOUS

## 2016-01-30 SURGICAL SUPPLY — 57 items
AUTOTRANSFUS HAS 1/8 (MISCELLANEOUS) ×2
BATTERY INSTRU NAVIGATION (MISCELLANEOUS) ×8 IMPLANT
BLADE SAW 1 (BLADE) ×2 IMPLANT
BLADE SAW 1/2 (BLADE) ×2 IMPLANT
BONE CEMENT GENTAMICIN (Cement) ×4 IMPLANT
CANISTER SUCT 1200ML W/VALVE (MISCELLANEOUS) ×2 IMPLANT
CANISTER SUCT 3000ML (MISCELLANEOUS) ×4 IMPLANT
CAP KNEE TOTAL 3 SIGMA ×2 IMPLANT
CATH TRAY METER 16FR LF (MISCELLANEOUS) ×2 IMPLANT
CEMENT BONE GENTAMICIN 40 (Cement) ×2 IMPLANT
COOLER POLAR GLACIER W/PUMP (MISCELLANEOUS) ×2 IMPLANT
DRAPE SHEET LG 3/4 BI-LAMINATE (DRAPES) ×2 IMPLANT
DRSG DERMACEA 8X12 NADH (GAUZE/BANDAGES/DRESSINGS) ×2 IMPLANT
DRSG OPSITE POSTOP 4X14 (GAUZE/BANDAGES/DRESSINGS) ×2 IMPLANT
DRSG TEGADERM 4X4.75 (GAUZE/BANDAGES/DRESSINGS) ×2 IMPLANT
DURAPREP 26ML APPLICATOR (WOUND CARE) ×2 IMPLANT
ELECT CAUTERY BLADE 6.4 (BLADE) ×2 IMPLANT
ELECT REM PT RETURN 9FT ADLT (ELECTROSURGICAL) ×2
ELECTRODE REM PT RTRN 9FT ADLT (ELECTROSURGICAL) ×1 IMPLANT
EX-PIN ORTHOLOCK NAV 4X150 (PIN) ×4 IMPLANT
GLOVE BIOGEL M STRL SZ7.5 (GLOVE) ×4 IMPLANT
GLOVE INDICATOR 8.0 STRL GRN (GLOVE) ×2 IMPLANT
GLOVE SURG 9.0 ORTHO LTXF (GLOVE) ×2 IMPLANT
GLOVE SURG ORTHO 9.0 STRL STRW (GLOVE) ×2 IMPLANT
GOWN STRL REUS W/ TWL LRG LVL3 (GOWN DISPOSABLE) ×2 IMPLANT
GOWN STRL REUS W/TWL 2XL LVL3 (GOWN DISPOSABLE) ×2 IMPLANT
GOWN STRL REUS W/TWL LRG LVL3 (GOWN DISPOSABLE) ×2
HANDPIECE SUCTION TUBG SURGILV (MISCELLANEOUS) ×2 IMPLANT
HOLDER FOLEY CATH W/STRAP (MISCELLANEOUS) ×2 IMPLANT
HOOD PEEL AWAY FLYTE STAYCOOL (MISCELLANEOUS) ×4 IMPLANT
KIT RM TURNOVER STRD PROC AR (KITS) ×2 IMPLANT
KNIFE SCULPS 14X20 (INSTRUMENTS) ×2 IMPLANT
NDL SAFETY 18GX1.5 (NEEDLE) ×2 IMPLANT
NEEDLE SPNL 20GX3.5 QUINCKE YW (NEEDLE) ×2 IMPLANT
NS IRRIG 500ML POUR BTL (IV SOLUTION) ×2 IMPLANT
PACK TOTAL KNEE (MISCELLANEOUS) ×2 IMPLANT
PAD WRAPON POLAR KNEE (MISCELLANEOUS) ×1 IMPLANT
PIN DRILL QUICK PACK ×2 IMPLANT
PIN FIXATION 1/8DIA X 3INL (PIN) ×2 IMPLANT
SOL .9 NS 3000ML IRR  AL (IV SOLUTION) ×1
SOL .9 NS 3000ML IRR UROMATIC (IV SOLUTION) ×1 IMPLANT
SOL PREP PVP 2OZ (MISCELLANEOUS) ×2
SOLUTION PREP PVP 2OZ (MISCELLANEOUS) ×1 IMPLANT
SPONGE DRAIN TRACH 4X4 STRL 2S (GAUZE/BANDAGES/DRESSINGS) ×2 IMPLANT
STAPLER SKIN PROX 35W (STAPLE) ×2 IMPLANT
SUCTION FRAZIER HANDLE 10FR (MISCELLANEOUS) ×1
SUCTION TUBE FRAZIER 10FR DISP (MISCELLANEOUS) ×1 IMPLANT
SUT VIC AB 0 CT1 36 (SUTURE) ×2 IMPLANT
SUT VIC AB 1 CT1 36 (SUTURE) ×4 IMPLANT
SUT VIC AB 2-0 CT2 27 (SUTURE) ×2 IMPLANT
SYR 20CC LL (SYRINGE) ×2 IMPLANT
SYR 30ML LL (SYRINGE) ×2 IMPLANT
SYR 50ML LL SCALE MARK (SYRINGE) ×2 IMPLANT
SYSTEM AUTOTRANSFUS DUAL TROCR (MISCELLANEOUS) ×1 IMPLANT
TOWEL OR 17X26 4PK STRL BLUE (TOWEL DISPOSABLE) ×2 IMPLANT
TOWER CARTRIDGE SMART MIX (DISPOSABLE) ×2 IMPLANT
WRAPON POLAR PAD KNEE (MISCELLANEOUS) ×2

## 2016-01-30 NOTE — Progress Notes (Signed)

## 2016-01-30 NOTE — Op Note (Signed)
OPERATIVE NOTE  DATE OF SURGERY:  01/30/2016  PATIENT NAME:  Helen Ramsey   DOB: Jun 07, 1933  MRN: [id number]  PRE-OPERATIVE DIAGNOSIS: Degenerative arthrosis of the right knee, primary  POST-OPERATIVE DIAGNOSIS:  Same  PROCEDURE:  Right total knee arthroplasty using computer-assisted navigation  SURGEON:  Jena Gauss. M.D.  ASSISTANT:  Van Clines, PA (present and scrubbed throughout the case, critical for assistance with exposure, retraction, instrumentation, and closure)  ANESTHESIA: spinal  ESTIMATED BLOOD LOSS: 50 mL  FLUIDS REPLACED: 1600 mL of crystalloid  TOURNIQUET TIME: 74 minutes  DRAINS: 2 medium drains to a reinfusion system  SOFT TISSUE RELEASES: Anterior cruciate ligament, posterior cruciate ligament, deep medial collateral ligament, patellofemoral ligament   IMPLANTS UTILIZED: DePuy PFC Sigma size 3 posterior stabilized femoral component (cemented), size 3 MBT tibial component (cemented), 32 mm 3 peg oval dome patella (cemented), and a 10 mm stabilized rotating platform polyethylene insert.  INDICATIONS FOR SURGERY: Helen Ramsey is a 82 y.o. year old female with a long history of progressive knee pain. X-rays demonstrated severe degenerative changes in tricompartmental fashion. The patient had not seen any significant improvement despite conservative nonsurgical intervention. After discussion of the risks and benefits of surgical intervention, the patient expressed understanding of the risks benefits and agree with plans for total knee arthroplasty.   The risks, benefits, and alternatives were discussed at length including but not limited to the risks of infection, bleeding, nerve injury, stiffness, blood clots, the need for revision surgery, cardiopulmonary complications, among others, and they were willing to proceed.  PROCEDURE IN DETAIL: The patient was brought into the operating room and, after adequate spinal anesthesia was achieved, a tourniquet was  placed on the patient's upper thigh. The patient's knee and leg were cleaned and prepped with alcohol and DuraPrep and draped in the usual sterile fashion. A "timeout" was performed as per usual protocol. The lower extremity was exsanguinated using an Esmarch, and the tourniquet was inflated to 300 mmHg. An anterior longitudinal incision was made followed by a standard mid vastus approach. The deep fibers of the medial collateral ligament were elevated in a subperiosteal fashion off of the medial flare of the tibia so as to maintain a continuous soft tissue sleeve. The patella was subluxed laterally and the patellofemoral ligament was incised. Inspection of the knee demonstrated severe degenerative changes with full-thickness loss of articular cartilage. Osteophytes were debrided using a rongeur. Anterior and posterior cruciate ligaments were excised. Two 4.0 mm Schanz pins were inserted in the femur and into the tibia for attachment of the array of trackers used for computer-assisted navigation. Hip center was identified using a circumduction technique. Distal landmarks were mapped using the computer. The distal femur and proximal tibia were mapped using the computer. The distal femoral cutting guide was positioned using computer-assisted navigation so as to achieve a 5 distal valgus cut. The femur was sized and it was felt that a size 3 femoral component was appropriate. A size 3 femoral cutting guide was positioned and the anterior cut was performed and verified using the computer. This was followed by completion of the posterior and chamfer cuts. Femoral cutting guide for the central box was then positioned in the center box cut was performed.  Attention was then directed to the proximal tibia. Medial and lateral menisci were excised. The extramedullary tibial cutting guide was positioned using computer-assisted navigation so as to achieve a 0 varus-valgus alignment and 0 posterior slope. The cut was  performed and verified  using the computer. The proximal tibia was sized and it was felt that a size 3 tibial tray was appropriate. Tibial and femoral trials were inserted followed by insertion of a 10 mm polyethylene insert.This allowed for excellent mediolateral soft tissue balancing both in flexion and in full extension. Finally, the patella was cut and prepared so as to accommodate a 32 mm 3 peg oval dome patella. A patella trial was placed and the knee was placed through a range of motion with excellent patellar tracking appreciated. The femoral trial was removed after debridement of posterior osteophytes. The central post-hole for the tibial component was reamed followed by insertion of a keel punch. Tibial trials were then removed. Cut surfaces of bone were irrigated with copious amounts of normal saline with antibiotic solution using pulsatile lavage and then suctioned dry. Polymethylmethacrylate cement with gentamicin was prepared in the usual fashion using a vacuum mixer. Cement was applied to the cut surface of the proximal tibia as well as along the undersurface of a size 3 MBT tibial component. Tibial component was positioned and impacted into place. Excess cement was removed using Personal assistant. Cement was then applied to the cut surfaces of the femur as well as along the posterior flanges of the size 3 femoral component. The femoral component was positioned and impacted into place. Excess cement was removed using Personal assistant. A 10 mm polyethylene trial was inserted and the knee was brought into full extension with steady axial compression applied. Finally, cement was applied to the backside of a 32 mm 3 peg oval dome patella and the patellar component was positioned and patellar clamp applied. Excess cement was removed using Personal assistant. After adequate curing of the cement, the tourniquet was deflated after a total tourniquet time of 74 minutes. Hemostasis was achieved using electrocautery.  The knee was irrigated with copious amounts of normal saline with antibiotic solution using pulsatile lavage and then suctioned dry. 20 mL of 1.3% Exparel in 40 mL of normal saline was injected along the posterior capsule, medial and lateral gutters, and along the arthrotomy site. A 10 mm stabilized rotating platform polyethylene insert was inserted and the knee was placed through a range of motion with excellent mediolateral soft tissue balancing appreciated and excellent patellar tracking noted. 2 medium drains were placed in the wound bed and brought out through separate stab incisions to be attached to a reinfusion system. The medial parapatellar portion of the incision was reapproximated using interrupted sutures of #1 Vicryl. Subcutaneous tissue was then injected with a total of 30 cc of 0.25% Marcaine with epinephrine. Subcutaneous tissue was approximated in layers using first #0 Vicryl followed #2-0 Vicryl. The skin was approximated with skin staples. A sterile dressing was applied.  The patient tolerated the procedure well and was transported to the recovery room in stable condition.    Helen Ramsey., M.D.

## 2016-01-30 NOTE — Transfer of Care (Signed)
Immediate Anesthesia Transfer of Care Note  Patient: Helen Ramsey  Procedure(s) Performed: Procedure(s): COMPUTER ASSISTED TOTAL KNEE ARTHROPLASTY (Right)  Patient Location: PACU  Anesthesia Type:Spinal  Level of Consciousness: awake, alert  and oriented  Airway & Oxygen Therapy: Patient Spontanous Breathing and Patient connected to face mask oxygen  Post-op Assessment: Report given to RN and Post -op Vital signs reviewed and stable  Post vital signs: Reviewed and stable  Last Vitals:  Filed Vitals:   01/30/16 1010  BP: 149/50  Pulse: 63  Temp: 36.7 C  Resp: 18    Complications: No apparent anesthesia complications

## 2016-01-30 NOTE — NC FL2 (Signed)
[place] LEVEL OF CARE SCREENING TOOL     IDENTIFICATION  Patient Name: Helen Ramsey Birthdate: 1933-02-18 Sex: female Admission Date (Current Location): 01/30/2016  [facility] and [place] Number:  Engineering geologist and Address:  [facility], [address], [place], [place] [address]      Provider Number: [id number]  Attending Physician Name and Address:  Dereck Leep, MD  Relative Name and Phone Number:       Current Level of Care: Hospital Recommended Level of Care: [place] Prior Approval Number:    Date Approved/Denied:   PASRR Number:    Discharge Plan: SNF    Current Diagnoses: Patient Active Problem List   Diagnosis Date Noted  . S/P total knee arthroplasty 01/30/2016  . Systolic ejection murmur [id number]  . Anxiety, generalized 12/19/2015  . Vitamin D deficiency 11/29/2015  . Multiple food allergies 11/29/2015  . Headache 10/28/2015  . Leg cramps 10/24/2015  . Knee pain, bilateral 08/29/2015  . Sleep apnea 05/30/2015  . Adenomatous colon polyp 05/09/2015  . HLD (hyperlipidemia) 05/09/2015  . Restless leg 05/09/2015  . Allergic rhinitis 03/28/2015  . Absolute anemia 03/28/2015  . Anxiety and depression 03/28/2015  . Arthritis 03/28/2015  . Cervical pain 03/28/2015  . Chronic headache 03/28/2015  . History of colonic polyps 03/28/2015  . H/O: osteoarthritis 03/28/2015  . Clinical depression 03/28/2015  . Diverticulosis of colon 03/28/2015  . DDD (degenerative disc disease), lumbar 03/28/2015  . Acid reflux 03/28/2015  . 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 Essential hypertension 03/28/2015  . Adaptive colitis 03/28/2015  . Narrowing of intervertebral disc space 03/28/2015  . Nasal septal perforation 03/28/2015  . Recurrent sinus infections 03/28/2015  . Spinal stenosis 03/28/2015  . Gastric ulcer 03/28/2015  . Cerebral artery occlusion with cerebral  infarction ([place]) 03/28/2015  . Aortic atherosclerosis ([place]) 07/13/2014  . H/O adenomatous polyp of colon 06/27/2005    Orientation RESPIRATION BLADDER Height & Weight     Self, Time, Situation, Place  Normal Continent Weight: 201 lb 14.4 oz (91.581 kg) Height:  (!) 5" (12.7 cm)  BEHAVIORAL SYMPTOMS/MOOD NEUROLOGICAL BOWEL NUTRITION STATUS   (None)  (None) Continent Diet (clear liquid )  AMBULATORY STATUS COMMUNICATION OF NEEDS Skin   Extensive Assist Verbally Other (Comment) (Closed Incision Right Knee & Closed System Drain Right Knee Autovac)                       Personal Care Assistance Level of Assistance  Bathing, Feeding, Dressing Bathing Assistance: Limited assistance Feeding assistance: Independent Dressing Assistance: Limited assistance     Functional Limitations Info  Sight, Hearing, Speech Sight Info: Adequate Hearing Info: Adequate Speech Info: Adequate    SPECIAL CARE FACTORS FREQUENCY  PT (By licensed PT)     PT Frequency:  (5)              Contractures      Additional Factors Info  Code Status, Allergies, Isolation Precautions Code Status Info:  (Full Code) Allergies Info:  (Amoxicillin, Clindamycin/lincomycin, Doxycycline, Levaquin, Mirabegron, Nsaids, Phenergan, Singulair, Vicodin, Zocor, Zoloft , Ampicillin, Bactrim, Penicillins)     Isolation Precautions Info:  (Contact precautions-MRSA)     Current Medications (01/30/2016):  This is the current hospital active medication list Current Facility-Administered Medications  Medication Dose Route Frequency Provider Last Rate Last Dose  . 0.9 %  sodium chloride infusion   Intravenous Continuous Dereck Leep, MD 100 mL/hr at  01/30/16 1553    . acetaminophen (OFIRMEV) IV 1,000 mg  1,000 mg Intravenous 4 times per day Dereck Leep, MD      . acetaminophen (TYLENOL) tablet 650 mg  650 mg Oral Q6H PRN Dereck Leep, MD       Or  . acetaminophen (TYLENOL) suppository 650 mg  650 mg Rectal Q6H  PRN Dereck Leep, MD      . alum & mag hydroxide-simeth (MAALOX/MYLANTA) 200-200-20 MG/5ML suspension 30 mL  30 mL Oral Q4H PRN Dereck Leep, MD      . atorvastatin (LIPITOR) tablet 20 mg  20 mg Oral Daily Dereck Leep, MD   20 mg at 01/30/16 1554  . azelastine (ASTELIN) 0.1 % nasal spray 1 spray  1 spray Each Nare PRN Dereck Leep, MD      . bisacodyl (DULCOLAX) suppository 10 mg  10 mg Rectal Daily PRN Dereck Leep, MD      . calcium carbonate (TUMS - dosed in mg elemental calcium) chewable tablet 200 mg of elemental calcium  1 tablet Oral Daily Dereck Leep, MD   200 mg of elemental calcium at 01/30/16 1554  . calcium-vitamin D (OSCAL WITH D) 500-200 MG-UNIT per tablet 1 tablet  1 tablet Oral BID Dereck Leep, MD   1 tablet at 01/30/16 1555  . ceFAZolin (ANCEF) IVPB 2 g/50 mL premix  2 g Intravenous Q6H Dereck Leep, MD      . cholecalciferol (VITAMIN D) tablet 1,000 Units  1,000 Units Oral Daily Dereck Leep, MD   1,000 Units at 01/30/16 1556  . citalopram (CELEXA) tablet 10 mg  10 mg Oral Daily Dereck Leep, MD   10 mg at 01/30/16 1556  . diphenhydrAMINE (BENADRYL) 12.5 MG/5ML elixir 12.5-25 mg  12.5-25 mg Oral Q4H PRN Dereck Leep, MD      . Derrill Memo ON 01/31/2016] enoxaparin (LOVENOX) injection 30 mg  30 mg Subcutaneous Q12H Dereck Leep, MD      . ferrous sulfate tablet 325 mg  325 mg Oral BID WC Dereck Leep, MD      . fluticasone (FLONASE) 50 MCG/ACT nasal spray 2 spray  2 spray Each Nare Daily Dereck Leep, MD      . Derrill Memo ON 01/31/2016] gabapentin (NEURONTIN) capsule 300 mg  300 mg Oral Q breakfast Dereck Leep, MD      . gabapentin (NEURONTIN) capsule 600 mg  600 mg Oral QHS Dereck Leep, MD      . hydrochlorothiazide (HYDRODIURIL) tablet 12.5 mg  12.5 mg Oral Daily Dereck Leep, MD   12.5 mg at 01/30/16 1557  . lisinopril (PRINIVIL,ZESTRIL) tablet 5 mg  5 mg Oral Daily Dereck Leep, MD   5 mg at 01/30/16 1556  . loperamide (IMODIUM) capsule 2 mg  2 mg  Oral PRN Dereck Leep, MD      . loratadine (CLARITIN) tablet 10 mg  10 mg Oral Daily Dereck Leep, MD   10 mg at 01/30/16 1553  . magnesium hydroxide (MILK OF MAGNESIA) suspension 30 mL  30 mL Oral Daily PRN Dereck Leep, MD      . magnesium oxide (MAG-OX) tablet 400 mg  400 mg Oral Q supper Dereck Leep, MD      . menthol-cetylpyridinium (CEPACOL) lozenge 3 mg  1 lozenge Oral PRN Dereck Leep, MD       Or  . phenol (CHLORASEPTIC)  mouth spray 1 spray  1 spray Mouth/Throat PRN Dereck Leep, MD      . metoCLOPramide (REGLAN) tablet 10 mg  10 mg Oral TID AC & HS Dereck Leep, MD      . morphine 2 MG/ML injection 2 mg  2 mg Intravenous Q2H PRN Dereck Leep, MD      . omega-3 acid ethyl esters (LOVAZA) capsule 2 g  2 g Oral Daily Dereck Leep, MD   2 g at 01/30/16 1558  . ondansetron (ZOFRAN) tablet 4 mg  4 mg Oral Q6H PRN Dereck Leep, MD       Or  . ondansetron (ZOFRAN) injection 4 mg  4 mg Intravenous Q6H PRN Dereck Leep, MD      . oxyCODONE (Oxy IR/ROXICODONE) immediate release tablet 5-10 mg  5-10 mg Oral Q4H PRN Dereck Leep, MD   5 mg at 01/30/16 1606  . pantoprazole (PROTONIX) EC tablet 40 mg  40 mg Oral BID Dereck Leep, MD   40 mg at 01/30/16 1605  . phenazopyridine (PYRIDIUM) tablet 100 mg  100 mg Oral PRN Dereck Leep, MD      . potassium chloride SA (K-DUR,KLOR-CON) CR tablet 20 mEq  20 mEq Oral Q supper Dereck Leep, MD      . senna-docusate (Senokot-S) tablet 1 tablet  1 tablet Oral BID Dereck Leep, MD   1 tablet at 01/30/16 1606  . simethicone (MYLICON) chewable tablet 160 mg  160 mg Oral QID PRN Dereck Leep, MD      . sodium chloride (OCEAN) 0.65 % nasal spray 2 spray  2 spray Each Nare PRN Dereck Leep, MD      . sodium phosphate (FLEET) 7-19 GM/118ML enema 1 enema  1 enema Rectal Once PRN Dereck Leep, MD      . traMADol Veatrice Bourbon) tablet 50-100 mg  50-100 mg Oral Q4H PRN Dereck Leep, MD         Discharge Medications: Please see  discharge summary for a list of discharge medications.  Relevant Imaging Results:  Relevant Lab Results:   Additional Information  (SSN [id number])  Lorenso Quarry Sunkins, LCSW

## 2016-01-30 NOTE — H&P (Signed)
The patient has been re-examined, and the chart reviewed, and there have been no interval changes to the documented history and physical.    The risks, benefits, and alternatives have been discussed at length. The patient expressed understanding of the risks benefits and agreed with plans for surgical intervention.  Reyden Smith P. Manvi Guilliams, Jr. M.D.    

## 2016-01-30 NOTE — Anesthesia Preprocedure Evaluation (Signed)
Anesthesia Evaluation  Patient identified by MRN, date of birth, ID band Patient awake    Reviewed: Allergy & Precautions, H&P , NPO status , Patient's Chart, lab work & pertinent test results, reviewed documented beta blocker date and time   Airway Mallampati: III   Neck ROM: full    Dental  (+) Poor Dentition, Teeth Intact   Pulmonary neg pulmonary ROS, sleep apnea ,    Pulmonary exam normal        Cardiovascular hypertension, + Peripheral Vascular Disease  negative cardio ROS Normal cardiovascular exam Rate:Normal     Neuro/Psych  Headaches, PSYCHIATRIC DISORDERS  Neuromuscular disease CVA, No Residual Symptoms negative neurological ROS  negative psych ROS   GI/Hepatic negative GI ROS, Neg liver ROS, PUD, GERD  ,  Endo/Other  negative endocrine ROS  Renal/GU negative Renal ROS  negative genitourinary   Musculoskeletal   Abdominal   Peds  Hematology negative hematology ROS (+) anemia ,   Anesthesia Other Findings Past Medical History:   Allergic rhinitis, cause unspecified                         Urinary frequency                                            Dizziness and giddiness                                      Atherosclerosis of aorta (HCC)                               Unspecified essential hypertension                           Other and unspecified hyperlipidemia                         Degeneration of lumbar or lumbosacral interver*              Unspecified sleep apnea                                      Allergic rhinitis, cause unspecified                         Headache(784.0)                                              Dysthymic disorder                                           Myalgia and myositis, unspecified                            Osteoporosis, unspecified  Trigger finger (acquired)                                    Other specified disorders of  bladder                         Diverticulitis of colon (without mention of he*              Other diseases of nasal cavity and sinuses(478*              Back pain                                                      Comment:lumbar   Syncope                                                    Past Surgical History:   CARPAL TUNNEL RELEASE                                         TONSILLECTOMY                                                 ABDOMINAL HYSTERECTOMY                                        TEMPORAL ARTERY BIOPSY / LIGATION                             ADENOIDECTOMY                                                 R/O Lymph Nodes; Right Axilla                                 ARTHROSCOPIC KNEE SURGERY                                     CARDIAC CATHETERIZATION                                         Comment:[facility]   APPENDECTOMY  CHOLECYSTECTOMY                                               TUBAL LIGATION                                                ROTATOR CUFF REPAIR                             Right              CATARACT EXTRACTION W/ INTRAOCULAR LENS IMPLAN*             BMI    Body Mass Index   36.52 kg/m 2     Reproductive/Obstetrics                             Anesthesia Physical Anesthesia Plan  ASA: III  Anesthesia Plan: General and Spinal   Post-op Pain Management:    Induction:   Airway Management Planned:   Additional Equipment:   Intra-op Plan:   Post-operative Plan:   Informed Consent: I have reviewed the patients History and Physical, chart, labs and discussed the procedure including the risks, benefits and alternatives for the proposed anesthesia with the patient or authorized representative who has indicated his/her understanding and acceptance.   Dental Advisory Given  Plan Discussed with: CRNA  Anesthesia Plan Comments:         Anesthesia Quick Evaluation

## 2016-01-30 NOTE — Brief Op Note (Signed)
01/30/2016  2:15 PM  PATIENT:  Redmond Pulling  82 y.o. female  PRE-OPERATIVE DIAGNOSIS:  Degenerative arthrosis of the right knee  POST-OPERATIVE DIAGNOSIS:  Same  PROCEDURE:  Procedure(s): COMPUTER ASSISTED TOTAL KNEE ARTHROPLASTY (Right)  SURGEON:  Surgeon(s) and Role:    * Dereck Leep, MD - Primary  ASSISTANTS: Vance Peper, PA   ANESTHESIA:   spinal  EBL:  Total I/O In: 1600 [I.V.:1600] Out: 350 [Urine:300; Blood:50]  BLOOD ADMINISTERED:none  DRAINS: 2 medium drains to a reinfusion system   LOCAL MEDICATIONS USED:  MARCAINE    and OTHER Exparel  SPECIMEN:  No Specimen  DISPOSITION OF SPECIMEN:  N/A  COUNTS:  YES  TOURNIQUET:   74 minutes  DICTATION: .Dragon Dictation  PLAN OF CARE: Admit to inpatient   PATIENT DISPOSITION:  PACU - hemodynamically stable.   Delay start of Pharmacological VTE agent (>24hrs) due to surgical blood loss or risk of bleeding: yes

## 2016-01-30 NOTE — Anesthesia Procedure Notes (Signed)
Spinal Patient location during procedure: OR Start time: 01/30/2016 11:10 AM End time: 01/30/2016 11:18 AM Staffing Anesthesiologist: Molli Barrows Resident/CRNA: Lance Muss Performed by: anesthesiologist and resident/CRNA  Preanesthetic Checklist Completed: patient identified, site marked, surgical consent, pre-op evaluation, timeout performed, IV checked, risks and benefits discussed and monitors and equipment checked Spinal Block Patient position: sitting Prep: Betadine Patient monitoring: blood pressure and continuous pulse ox Approach: midline Location: L4-5 Injection technique: single-shot Needle Needle gauge: 24 G Assessment Sensory level: T4 Additional Notes Unable to obtain spinal by [facility] CRNA with first attempt with 24 g spinal needle 3.5 in. Dr Andree Elk at bedside, switched to 22 g spinal needle. Dr. Andree Elk able to obtain spinal with only attempt. Positive CSF confirmation.

## 2016-01-30 NOTE — H&P (Signed)
The patient has been re-examined, and the chart reviewed, and there have been no interval changes to the documented history and physical.    The risks, benefits, and alternatives have been discussed at length. The patient expressed understanding of the risks benefits and agreed with plans for surgical intervention.  Aleese Kamps P. Brecklynn Jian, Jr. M.D.    

## 2016-01-31 LAB — CBC
HCT: 31 % — ABNORMAL LOW (ref 35.0–47.0)
Hemoglobin: 10.8 g/dL — ABNORMAL LOW (ref 12.0–16.0)
MCH: 30.5 pg (ref 26.0–34.0)
MCHC: 34.7 g/dL (ref 32.0–36.0)
MCV: 87.9 fL (ref 80.0–100.0)
PLATELETS: 149 10*3/uL — AB (ref 150–440)
RBC: 3.53 MIL/uL — ABNORMAL LOW (ref 3.80–5.20)
RDW: 13.8 % (ref 11.5–14.5)
WBC: 7 10*3/uL (ref 3.6–11.0)

## 2016-01-31 LAB — BASIC METABOLIC PANEL
Anion gap: 4 — ABNORMAL LOW (ref 5–15)
BUN: 14 mg/dL (ref 6–20)
CALCIUM: 7.9 mg/dL — AB (ref 8.9–10.3)
CO2: 25 mmol/L (ref 22–32)
CREATININE: 0.57 mg/dL (ref 0.44–1.00)
Chloride: 104 mmol/L (ref 101–111)
GFR calc Af Amer: >60 mL/min (ref >60)
GLUCOSE: 118 mg/dL — AB (ref 65–99)
POTASSIUM: 4.1 mmol/L (ref 3.5–5.1)
Sodium: 133 mmol/L — ABNORMAL LOW (ref 135–145)

## 2016-01-31 MED ORDER — CITALOPRAM HYDROBROMIDE 20 MG PO TABS
20.0000 mg | ORAL_TABLET | Freq: Every day | ORAL | Status: DC
  Administered 2016-01-31 – 2016-02-02 (×3): 20 mg via ORAL
  Filled 2016-01-31 (×3): qty 1

## 2016-01-31 MED ORDER — OXYCODONE HCL 5 MG PO TABS: 5.0000 mg | ORAL_TABLET | ORAL | Status: DC | PRN

## 2016-01-31 MED ORDER — ENOXAPARIN SODIUM 30 MG/0.3ML ~~LOC~~ SOLN: 30.0000 mg | Freq: Two times a day (BID) | SUBCUTANEOUS | Status: DC

## 2016-01-31 MED ORDER — TRAMADOL HCL 50 MG PO TABS: 50.0000 mg | ORAL_TABLET | ORAL | Status: DC | PRN

## 2016-01-31 NOTE — Care Management Note (Signed)
Case Management Note  Patient Details  Name: Helen Ramsey MRN: [id number] Date of Birth: 05-18-1933  Subjective/Objective:                  Spoke with patient regarding discharge planning. She lives alone in a senior apartment off [address]. She has a daughter but states that "no one is available to help her with this ice thing on my leg". She is concerned about going home alone and doesn't have a back up plan if Medicare declines SNF. She wants to go to [facility] or [facility] for rehab. She uses [facility] for [facility]. She has a front wheeled walker in the home and an elevated toilet seat.   Action/Plan:   List of home health agencies left with her yesterday for review. I explained Lovenox treatment at discharge if she is able to go home. She does not have money to pay privately for SNF/rehab. RNCM will continue to follow. PT pending.  Expected Discharge Date:                  Expected Discharge Plan:     In-House Referral:     Discharge planning Services  CM Consult  Post Acute Care Choice:  Home Health Choice offered to:  Patient  DME Arranged:    DME Agency:     HH Arranged:  PT HH Agency:     Status of Service:     Medicare Important Message Given:    Date Medicare IM Given:    Medicare IM give by:    Date Additional Medicare IM Given:    Additional Medicare Important Message give by:     If discussed at [place] of Stay Meetings, dates discussed:    Additional Comments:  Marshell Garfinkel, RN 01/31/2016, 8:24 AM

## 2016-01-31 NOTE — Evaluation (Signed)
Physical Therapy Evaluation Patient Details Name: Helen Ramsey MRN: [id number] DOB: [birth year] Today's Date: 01/31/2016   History of Present Illness  Pt. is an 82 y.o. female with PMH of osteoporosis, dizziness, diverticulitis, HTN and lumbar degeneration.  Pt underwent a Right TKR (01-30-16) following a progressive decline in functioning since January. Pt. has had 2 falls in the past 6 months.    Clinical Impression  Prior to admission pt was independent with rollator; however pt reported that she has had 2 falls in the last 6 months.  Pt lives alone.  Pt was in chair upon arrival.  Pt complained of being "sleepy" at beginning of session.  Pt attempted AROM straight leg raises on R LE; however she could not perform them independently.  As a result, knee immobilizer was donned.  Pt was min to mod assist with sit to stand with RW.  Upon standing, pt became dizzy, her knees buckled and pt was sat back down.  Pt BP upon sitting was 109/37.  Pt's HR was 70 bpm and O2 saturation was 89% on RA.  Pt was instructed in diaphragmatic breathing.  Pt's O2 saturation increased to 94% O2 on RA.  Pt reported that her dizziness went away after sitting and diaphragmatic breathing.  Pt stood again and reported dizziness again (nursing notified).  Pt's BP on standing was 114/54, HR and O2 saturation did not change from sitting.  Pt was sat back down and instructed through diaphragmatic breathing.  Pt then performed in chair AROM LE exercises.  Due to aforementioned function and strength deficits, pt is in need of skilled physical therapy.  It is recommended that pt is discharged to SNF when medically appropriate.     Follow Up Recommendations SNF    Equipment Recommendations       Recommendations for Other Services       Precautions / Restrictions Precautions Precautions: Fall Precaution Comments: Knee Imomobilizer if pt cannot perform 10 AROM straight leg raises on surgical LE. Restrictions Weight Bearing  Restrictions: Yes Other Position/Activity Restrictions: WBAT      Mobility  Bed Mobility               General bed mobility comments: pt was in chair at beginning and end of session  Transfers Overall transfer level: Needs assistance Equipment used: Rolling walker (2 wheeled) Transfers: Sit to/from Stand 2 sets Sit to Stand: Min assist;Mod assist         General transfer comment: increased time, pt became dizzy upon standing, and required assistance to maintain balance during dizziness.  Ambulation/Gait             General Gait Details: not performed due to dizziness.  Stairs            Wheelchair Mobility    Modified Rankin (Stroke Patients Only)       Balance Overall balance assessment: Needs assistance Sitting-balance support: Feet supported Sitting balance-Leahy Scale: Fair     Standing balance support: Bilateral upper extremity supported (RW) Standing balance-Leahy Scale: Poor                               Pertinent Vitals/Pain Pain Assessment: 0-10 Pain Score: 5  Pain Location: R kne  Pain Descriptors / Indicators: Aching;Discomfort;Operative site guarding Pain Intervention(s): Limited activity within patient's tolerance;Monitored during session;Repositioned    Home Living Family/patient expects to be discharged to:: Private residence Living Arrangements: Alone Available Help at  Discharge: Family Type of Home: Apartment Home Access: Level entry     Home Layout: One level Home Equipment: Walker - 4 wheels      Prior Function Level of Independence: Independent         Comments: Pt reported she has had 2 falls in the last 6 months     Hand Dominance   Dominant Hand: Right    Extremity/Trunk Assessment   Upper Extremity Assessment: Overall WFL for tasks assessed           Lower Extremity Assessment: Generalized weakness      Cervical / Trunk Assessment: Normal  Communication   Communication: No  difficulties  Cognition Arousal/Alertness: Lethargic;Awake/alert Behavior During Therapy: WFL for tasks assessed/performed Overall Cognitive Status: Within Functional Limits for tasks assessed                      General Comments   Nursing was contacted and cleared pt for physical therapy.  Pt was agreeable and session was modified due to dizziness.  Nursing was notified of dizziness and blood pressure.  Pt's two daughter in laws were present during session.    Exercises Total Joint Exercises Ankle Circles/Pumps: AROM;Both;20 reps (2x10) Quad Sets: AROM;Right;10 reps Short Arc Quad: AROM;Right;10 reps Heel Slides: AAROM;Right;10 reps Hip ABduction/ADduction: AROM;Right;10 reps Straight Leg Raises: AROM;Right (could not perform 1 full rep) Goniometric ROM: R knee Flexion and extension assess sitting in chair: R knee flexion: 60 degrees; R Knee extension: -10 degrees  All exercises were performed sitting in chair.      Assessment/Plan    PT Assessment Patient needs continued PT services  PT Diagnosis Difficulty walking   PT Problem List Decreased strength;Decreased range of motion;Decreased activity tolerance;Decreased balance;Decreased mobility;Decreased coordination;Decreased knowledge of use of DME;Pain  PT Treatment Interventions DME instruction;Gait training;Functional mobility training;Therapeutic activities;Therapeutic exercise;Balance training;Patient/family education   PT Goals (Current goals can be found in the Care Plan section) Acute Rehab PT Goals Patient Stated Goal: To go home  PT Goal Formulation: With patient Time For Goal Achievement: 02/14/16 Potential to Achieve Goals: Fair    Frequency BID   Barriers to discharge        Co-evaluation               End of Session Equipment Utilized During Treatment: Gait belt Activity Tolerance: Patient limited by lethargy;Other (comment) (limited due to dizziness) Patient left: in chair;with call  bell/phone within reach;with chair alarm set;with SCD's reapplied;with family/visitor present (B towel rolls under heels, polarcare activated ) Nurse Communication: Mobility status         Time: [id number] PT Time Calculation (min) (ACUTE ONLY): 58 min   Charges:         PT G Codes:       Mittie Bodo, SPT Mittie Bodo 01/31/2016, 2:37 PM

## 2016-01-31 NOTE — Progress Notes (Signed)
Foley d/c'd at 54 with 50cc urine

## 2016-01-31 NOTE — Discharge Summary (Signed)
Physician Discharge Summary  Patient ID: Helen Ramsey MRN: [id number] DOB/AGE: [birth year] 82 y.o.  Admit date: 01/30/2016 Discharge date: 02/02/2016  Admission Diagnoses:  RIGHT KNEE ARTHRODESIS   Discharge Diagnoses: Patient Active Problem List   Diagnosis Date Noted  . S/P total knee arthroplasty 01/30/2016  . Systolic ejection murmur [id number]  . Anxiety, generalized 12/19/2015  . Vitamin D deficiency 11/29/2015  . Multiple food allergies 11/29/2015  . Headache 10/28/2015  . Leg cramps 10/24/2015  . Knee pain, bilateral 08/29/2015  . Sleep apnea 05/30/2015  . Adenomatous colon polyp 05/09/2015  . HLD (hyperlipidemia) 05/09/2015  . Restless leg 05/09/2015  . Allergic rhinitis 03/28/2015  . Absolute anemia 03/28/2015  . Anxiety and depression 03/28/2015  . Arthritis 03/28/2015  . Cervical pain 03/28/2015  . Chronic headache 03/28/2015  . History of colonic polyps 03/28/2015  . H/O: osteoarthritis 03/28/2015  . Clinical depression 03/28/2015  . Diverticulosis of colon 03/28/2015  . DDD (degenerative disc disease), lumbar 03/28/2015  . Acid reflux 03/28/2015  . 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 Essential hypertension 03/28/2015  . Adaptive colitis 03/28/2015  . Narrowing of intervertebral disc space 03/28/2015  . Nasal septal perforation 03/28/2015  . Recurrent sinus infections 03/28/2015  . Spinal stenosis 03/28/2015  . Gastric ulcer 03/28/2015  . Cerebral artery occlusion with cerebral infarction ([place]) 03/28/2015  . Aortic atherosclerosis ([place]) 07/13/2014  . H/O adenomatous polyp of colon 06/27/2005    Past Medical History  Diagnosis Date  . Allergic rhinitis, cause unspecified   . Urinary frequency   . Dizziness and giddiness   . Atherosclerosis of aorta ([place])   . Unspecified essential hypertension   . Other and unspecified hyperlipidemia   . Degeneration of lumbar or lumbosacral intervertebral disc   . Unspecified sleep  apnea   . Allergic rhinitis, cause unspecified   . Headache(784.0)   . Dysthymic disorder   . Myalgia and myositis, unspecified   . Osteoporosis, unspecified   . Trigger finger (acquired)   . Other specified disorders of bladder   . Diverticulitis of colon (without mention of hemorrhage)   . Other diseases of nasal cavity and sinuses(478.19)   . Back pain     lumbar  . Syncope      Transfusion: Autovac transfusion given first 6 hours postoperatively.   Consultants (if any):   case management for placement  Discharged Condition: Improved  Hospital Course: Helen Ramsey is an 82 y.o. female who was admitted 01/30/2016 with a diagnosis of degenerative arthrosis right knee and went to the operating room on 01/30/2016 and underwent the above named procedures.    Surgeries:Procedure(s): COMPUTER ASSISTED TOTAL KNEE ARTHROPLASTY on 01/30/2016  PRE-OPERATIVE DIAGNOSIS: Degenerative arthrosis of the right knee, primary  POST-OPERATIVE DIAGNOSIS: Same  PROCEDURE: Right total knee arthroplasty using computer-assisted navigation  SURGEON: Marciano Sequin. M.D.  ASSISTANT: Vance Peper, PA (present and scrubbed throughout the case, critical for assistance with exposure, retraction, instrumentation, and closure)  ANESTHESIA: spinal  ESTIMATED BLOOD LOSS: 50 mL  FLUIDS REPLACED: 1600 mL of crystalloid  TOURNIQUET TIME: 74 minutes  DRAINS: 2 medium drains to a reinfusion system  SOFT TISSUE RELEASES: Anterior cruciate ligament, posterior cruciate ligament, deep medial collateral ligament, patellofemoral ligament   IMPLANTS UTILIZED: DePuy PFC Sigma size 3 posterior stabilized femoral component (cemented), size 3 MBT tibial component (cemented), 32 mm 3 peg oval dome patella (cemented), and a 10 mm stabilized rotating platform polyethylene insert.  INDICATIONS FOR SURGERY: Helen Ramsey is  a 82 y.o. year old female with a long history of progressive knee pain. X-rays  demonstrated severe degenerative changes in tricompartmental fashion. The patient had not seen any significant improvement despite conservative nonsurgical intervention. After discussion of the risks and benefits of surgical intervention, the patient expressed understanding of the risks benefits and agree with plans for total knee arthroplasty.   The risks, benefits, and alternatives were discussed at length including but not limited to the risks of infection, bleeding, nerve injury, stiffness, blood clots, the need for revision surgery, cardiopulmonary complications, among others, and they were willing to proceed. Patient tolerated the surgery well. No complications .Patient was taken to PACU where she was stabilized and then transferred to the orthopedic floor.  Patient started on Lovenox 30 q 12 hrs. Foot pumps applied bilaterally at 80 mm hg. Heels elevated off bed with rolled towels. No evidence of DVT. Calves non tender. Negative Homan. Physical therapy started on day #1 for gait training and transfer with OT starting on  day #1 for ADL and assisted devices. Patient has done well with therapy. Ambulated 50 feet upon being discharged.  Patient's IV and Foley were discontinued on day #1 with the Hemovac being discontinued on day #2. Dressing also changed on day #2.   She was given perioperative antibiotics:  Anti-infectives    Start     Dose/Rate Route Frequency Ordered Stop   01/30/16 1730  ceFAZolin (ANCEF) IVPB 2 g/50 mL premix     2 g 100 mL/hr over 30 Minutes Intravenous Every 6 hours 01/30/16 1530 01/31/16 1729   01/30/16 1049  ceFAZolin (ANCEF) 2-3 GM-% IVPB SOLR    Comments:  Ronnell Freshwater: cabinet override      01/30/16 1049 01/30/16 1128   01/30/16 0330  vancomycin (VANCOCIN) IVPB 1000 mg/200 mL premix  Status:  Discontinued     1,000 mg 200 mL/hr over 60 Minutes Intravenous  Once 01/30/16 0327 01/30/16 1108    .  She was fitted with AV 1 compression foot pumps devices  bilaterally, instructed on heel pumps, early ambulation, and fitted with TED stockings bilaterally for DVT prophylaxis.  She benefited maximally from the hospital stay and there were no complications.    Recent vital signs:  Filed Vitals:   01/30/16 2143 01/31/16 0446  BP: 125/44 123/42  Pulse: 57 57  Temp: 98.2 F (36.8 C) 97.9 F (36.6 C)  Resp: 18 18    Recent laboratory studies:  Lab Results  Component Value Date   HGB 10.8* 01/31/2016   HGB 13.1 01/16/2016   HGB 12.2 12/13/2015   Lab Results  Component Value Date   WBC 7.0 01/31/2016   PLT 149* 01/31/2016   Lab Results  Component Value Date   INR 1.00 01/16/2016   Lab Results  Component Value Date   NA 133* 01/31/2016   K 4.1 01/31/2016   CL 104 01/31/2016   CO2 25 01/31/2016   BUN 14 01/31/2016   CREATININE 0.57 01/31/2016   GLUCOSE 118* 01/31/2016    Discharge Medications:     Medication List    STOP taking these medications        aspirin 81 MG tablet      TAKE these medications        acetaminophen 500 MG tablet  Commonly known as:  TYLENOL  Take 500 mg by mouth 3 (three) times daily as needed.     alendronate 70 MG tablet  Commonly known as:  FOSAMAX  Take 70  mg by mouth once a week.     atorvastatin 20 MG tablet  Commonly known as:  LIPITOR  Take 1 tablet (20 mg total) by mouth daily.     azelastine 0.1 % nasal spray  Commonly known as:  ASTELIN  Place 1 spray into both nostrils as needed for rhinitis. Use in each nostril as directed     AZO TABS PO  Take 1 tablet by mouth as needed.     calcium carbonate 500 MG chewable tablet  Commonly known as:  TUMS - dosed in mg elemental calcium  Chew 1 tablet by mouth daily.     Calcium Carbonate-Vitamin D 600-400 MG-UNIT tablet  Take 1 tablet by mouth 2 (two) times daily.     cetirizine 10 MG tablet  Commonly known as:  ZYRTEC  Take 10 mg by mouth daily.     cholecalciferol 1000 units tablet  Commonly known as:  VITAMIN D  Take 1  tablet (1,000 Units total) by mouth daily.     citalopram 10 MG tablet  Commonly known as:  CELEXA  Take 20 mg by mouth daily. Reported on 01/21/2016     docusate sodium 100 MG capsule  Commonly known as:  COLACE  Take 100 mg by mouth as needed for mild constipation.     enoxaparin 30 MG/0.3ML injection  Commonly known as:  LOVENOX  Inject 0.3 mLs (30 mg total) into the skin every 12 (twelve) hours.     Fish Oil 1200 MG Caps  Take 2 capsules by mouth daily.     fluticasone 50 MCG/ACT nasal spray  Commonly known as:  FLONASE  Place 2 sprays into both nostrils daily.     gabapentin 300 MG capsule  Commonly known as:  NEURONTIN  1 capsule q morning, and 2 capsules qhs     GAS RELIEF PO  Take by mouth.     hydrochlorothiazide 12.5 MG tablet  Commonly known as:  HYDRODIURIL  Take 1 tablet (12.5 mg total) by mouth daily.     lisinopril 5 MG tablet  Commonly known as:  PRINIVIL,ZESTRIL  Take 1 tablet (5 mg total) by mouth daily.     loperamide 2 MG capsule  Commonly known as:  IMODIUM  Take 2 mg by mouth as needed for diarrhea or loose stools.     loratadine 10 MG tablet  Commonly known as:  CLARITIN  Take 10 mg by mouth daily. Reported on 01/30/2016     Magnesium Oxide (Antacid) 500 MG Caps  Take 1 tablet by mouth daily with supper.     Melatonin 1 MG Tabs  Take 1 tablet by mouth at bedtime.     omeprazole 20 MG capsule  Commonly known as:  PRILOSEC  Take 1 capsule (20 mg total) by mouth daily.     oxyCODONE 5 MG immediate release tablet  Commonly known as:  Oxy IR/ROXICODONE  Take 1-2 tablets (5-10 mg total) by mouth every 4 (four) hours as needed for severe pain or breakthrough pain.     POTASSIUM GLUCONATE PO  Take 1 tablet by mouth daily with supper.     sodium chloride 0.65 % nasal spray  Commonly known as:  OCEAN  Reported on 01/30/2016     traMADol 50 MG tablet  Commonly known as:  ULTRAM  Take 1 tablet (50 mg total) by mouth 2 (two) times daily as  needed.     traMADol 50 MG tablet  Commonly known as:  Veatrice Bourbon  Take 1-2 tablets (50-100 mg total) by mouth every 4 (four) hours as needed for moderate pain.        Diagnostic Studies: Dg Knee Right Port  01/30/2016  CLINICAL DATA:  82 year old female with a history of right knee replacement EXAM: PORTABLE RIGHT KNEE - 1-2 VIEW COMPARISON:  12/13/2015 FINDINGS: Interval surgical changes of right knee arthroplasty, with surgical drains in place, surgical staples over the midline, soft tissue swelling. No complicating features identified.  Alignment maintained. IMPRESSION: Early postop changes of right knee arthroplasty, with no complicating features. Signed, Dulcy Fanny. Earleen Newport, DO [facility] Radiology Electronically Signed   By: Corrie Mckusick D.O.   On: 01/30/2016 14:59    Disposition: 01-Home or Self Care      Discharge Instructions    Diet - low sodium heart healthy    Complete by:  As directed      Increase activity slowly    Complete by:  As directed           1. Weight-bear as tolerated 2. Change dressing as needed 3. Polar Care to be worn around the clock. Maintain a temperature 55F 4. TED stockings are to be worn bilaterally around the clock but may be removed one hour per 8 hour shift 5. Do not get the wound wet. 6. Staples will be removed in the office in 2 weeks. 7. Continue Lovenox 30 mg every week every 12 hours for 14 days     Signed: WOLFE,JON R. 01/31/2016, 8:02 AM

## 2016-01-31 NOTE — Discharge Instructions (Signed)

## 2016-01-31 NOTE — Evaluation (Signed)
Occupational Therapy Evaluation Patient Details Name: SHALAYAH NICOTERA MRN: [id number] DOB: Dec 02, 1932 Today's Date: 01/31/2016    History of Present Illness Pt. is an 82 y.o. female who underwent a Right TKR following a progressive decline in functioning since January. Pt. has had 2 falls in the past 6 months.   Clinical Impression   Pt. Is an 82 y.o. female Who was admitted to [facility] for a right TKR. Pt. Presented with lethargy and intermittently closed eyes throughout session requiring cues to attend to session. Pt. Presents with limitations in ADL and IADL functioing, requires skilled OT services to improve LE dressing skills, toileting, and standing ADL/IADL tasks as BP allows.         Follow Up Recommendations       Equipment Recommendations       Recommendations for Other Services       Precautions / Restrictions Precautions Precautions: None Precaution Comments: Knee Imomobilizer Restrictions Weight Bearing Restrictions: Yes, WBAT      Mobility Bed Mobility                  Transfers      Pt. With low BP upon standing per PT                  Balance                                            ADL                                         General ADL Comments: Pt. is lethargic with intermittent  levels of alertness during tx which pt. and family attribute to medicine. Limted attention to task as pt. closes eyes intermittently throughout session. Pt. and Family ed. was provided about general A/E use to assist with LE dressing.      Vision     Perception     Praxis      Pertinent Vitals/Pain Pain Assessment: 0-10 Pain Score: 4      Hand Dominance Right   Extremity/Trunk Assessment Upper Extremity Assessment Upper Extremity Assessment: Overall WFL for tasks assessed           Communication     Cognition Arousal/Alertness: Lethargic;Awake/alert                         General  Comments       Exercises       Shoulder Instructions      Home Living Family/patient expects to be discharged to:: Private residence Living Arrangements: Alone Available Help at Discharge: Family Type of Home: Apartment Home Access: Level entry     Home Layout: One level     Bathroom Shower/Tub: Teacher, early years/pre: Standard     Home Equipment: Tub bench          Prior Functioning/Environment Level of Independence: Independent             OT Diagnosis: Generalized weakness;Acute pain   OT Problem List:     OT Treatment/Interventions:      OT Goals(Current goals can be found in the care plan section) Acute Rehab OT Goals Patient Stated Goal: To get better, and do more for herself.  Potential to Achieve Goals: Good ADL Goals Pt Will Perform Upper Body Dressing: with min guard assist;with adaptive equipment  OT Frequency:     Barriers to D/C:            Co-evaluation              End of Session    Activity Tolerance:  limited Patient left:  Sitting in chair, with Family present, and call bell in place.   Time: 1040-1100 OT Time Calculation (min): 20 min Charges:  OT General Charges $OT Visit: 1 Procedure OT Evaluation $OT Eval Low Complexity: 1 Procedure G-Codes:    Harrel Carina, MS, OTR/L  Harrel Carina 01/31/2016, 1:13 PM

## 2016-01-31 NOTE — Progress Notes (Signed)
Physical Therapy Treatment Patient Details Name: ZERELDA PTACEK MRN: [id number] DOB: 10/13/1933 Today's Date: 01/31/2016    History of Present Illness Pt. is an 82 y.o. female with PMH of osteoporosis, dizziness, diverticulitis, HTN and lumbar degeneration.  Pt underwent a Right TKR (01-30-16) following a progressive decline in functioning since January. Pt. has had 2 falls in the past 6 months.    PT Comments    Pt was in chair at beginning of session.  Pt performed 10 AROM straight leg raises with R LE and thus knee immobilizer was not utilized.  Pt performed further sitting in chair LE AROM exercises.  Pt was min assist for sit to stand with RW.  Pt was min assist for ambulation with RW for 12 feet.  Pt complained of fatigue and was sat in chair that was following.  Pt was asked repeatedly and did not complain of dizziness.  Pt was instructed in diaphragmatic breathing.  Pt then was min assist for stand pivot transfer from chair to bed.  Pt was min assist for bed mobility for R LE navigation.  Pt's HR and O2 saturation were WFL during session.  Next session PT will attempt increased ambulation distance.   Follow Up Recommendations  SNF     Equipment Recommendations       Recommendations for Other Services       Precautions / Restrictions Precautions Precautions: Fall Precaution Comments: Knee Imomobilizer if pt cannot perform 10 AROM straight leg raises on surgical LE. Restrictions Weight Bearing Restrictions: Yes Other Position/Activity Restrictions: WBAT    Mobility  Bed Mobility Overal bed mobility: Needs Assistance Bed Mobility: Rolling;Sit to Sidelying Rolling: Min guard       Sit to sidelying: Min assist (R LE navigation) General bed mobility comments: pt was in chair at beginning of session.  Transfers Overall transfer level: Needs assistance Equipment used: Rolling walker (2 wheeled) Transfers: Stand Pivot Transfers;Sit to/from Stand Sit to Stand: Min  assist Stand pivot transfers: Min assist       General transfer comment: increased time, occasional VC's for foot and walker placement  Ambulation/Gait Ambulation/Gait assistance: Min assist Ambulation Distance (Feet): 12 Feet Assistive device: Rolling walker (2 wheeled) Gait Pattern/deviations: Step-to pattern;Decreased step length - right;Decreased stance time - right;Decreased weight shift to right Gait velocity: decreased   General Gait Details: intermittent VC's for UE loading during ambulation   Stairs            Wheelchair Mobility    Modified Rankin (Stroke Patients Only)       Balance Overall balance assessment: Needs assistance Sitting-balance support: Feet supported Sitting balance-Leahy Scale: Fair     Standing balance support: Bilateral upper extremity supported (RW) Standing balance-Leahy Scale: Fair                      Cognition Arousal/Alertness: Lethargic;Awake/alert Behavior During Therapy: WFL for tasks assessed/performed Overall Cognitive Status: Within Functional Limits for tasks assessed                      Exercises Total Joint Exercises Ankle Circles/Pumps: AROM;Both;20 reps Hip ABduction/ADduction: AROM;Right;10 reps Straight Leg Raises: AROM;Right;10 reps General Exercises - Lower Extremity Hip Flexion/Marching: AROM;Both;20 reps (2x10)    General Comments  Pt was agreeable and session was modified due to fatigue.  Nursing was notified of ambulation distance and no dizziness symptoms.  Pt's daughter in law was present for session.       Pertinent  Vitals/Pain Pain Assessment: 0-10 Pain Score: 4  Pain Location: R knee Pain Descriptors / Indicators: Aching;Discomfort;Operative site guarding Pain Intervention(s): Limited activity within patient's tolerance;Monitored during session;Repositioned;Ice applied  See flow sheet for vitals.     Home Living      Prior Function    PT Goals (current goals can now  be found in the care plan section) Acute Rehab PT Goals Patient Stated Goal: To go home  PT Goal Formulation: With patient Time For Goal Achievement: 02/14/16 Potential to Achieve Goals: Fair Progress towards PT goals: Progressing toward goals    Frequency  BID    PT Plan Current plan remains appropriate    Co-evaluation             End of Session Equipment Utilized During Treatment: Gait belt Activity Tolerance: Patient limited by fatigue Patient left: in bed;with call bell/phone within reach;with bed alarm set;with family/visitor present;with SCD's reapplied (B towel rolls under heels, polarcare activated)     Time: [id number] PT Time Calculation (min) (ACUTE ONLY): 25 min  Charges:                       G Codes:      Mittie Bodo, SPT Mittie Bodo 01/31/2016, 3:21 PM

## 2016-01-31 NOTE — Clinical Social Work Placement (Signed)
   CLINICAL SOCIAL WORK PLACEMENT  NOTE  Date:  01/31/2016  Patient Details  Name: Helen Ramsey MRN: [id number] Date of Birth: October 22, 1933  Clinical Social Work is seeking post-discharge placement for this patient at the [place] level of care (*CSW will initial, date and re-position this form in  chart as items are completed):  No   Patient/family provided with [place] Work Department's list of facilities offering this level of care within the geographic area requested by the patient (or if unable, by the patient's family).  Yes   Patient/family informed of their freedom to choose among providers that offer the needed level of care, that participate in Medicare, Medicaid or managed care program needed by the patient, have an available bed and are willing to accept the patient.  Yes   Patient/family informed of [place]'s ownership interest in [facility] and [facility], as well as of the fact that they are under no obligation to receive care at these facilities.  PASRR submitted to EDS on 01/30/16     PASRR number received on 01/31/16     Existing PASRR number confirmed on       FL2 transmitted to all facilities in geographic area requested by pt/family on 01/31/16     FL2 transmitted to all facilities within larger geographic area on       Patient informed that his/her managed care company has contracts with or will negotiate with certain facilities, including the following:        Yes   Patient/family informed of bed offers received.  Patient chooses bed at  [facility])     Physician recommends and patient chooses bed at      Patient to be transferred to   on  .  Patient to be transferred to facility by       Patient family notified on   of transfer.  Name of family member notified:        PHYSICIAN       Additional Comment:    _______________________________________________ Loralyn Freshwater, LCSW 01/31/2016, 4:42  PM

## 2016-01-31 NOTE — Clinical Social Work Note (Signed)
Clinical Social Work Assessment  Patient Details  Name: Helen Ramsey MRN: [id number] Date of Birth: 09-03-1933  Date of referral:  01/31/16               Reason for consult:  Discharge Planning                Permission sought to share information with:  Family Supports Permission granted to share information::  Yes, Verbal Permission Granted  Name::        Agency::     Relationship::   ([name]-Son)  Contact Information:     Housing/Transportation Living arrangements for the past 2 months:  Single Family Home Source of Information:  Patient Patient Interpreter Needed:  None Criminal Activity/Legal Involvement Pertinent to Current Situation/Hospitalization:  No - Comment as needed Significant Relationships:  Adult Children Lives with:  Self Do you feel safe going back to the place where you live?  Yes Need for family participation in patient care:  Yes (Comment) (Adult Children )  Care giving concerns:  Patient reports that she cannot go home in her current state. She feels it will be unsafe.    Social Worker assessment / plan:  CSW was consulted by PT stating that it's recommended for SNF placement. CSW met with patient and her daughter at bedside. CSW explained her role. Patient granted CSW verbal permission to speak about discharge plans in front of family. She reports that she really wants to go to SNF at discharge. She reports that she's fearful to return home in her current condition. Patient reports that she would prefer [facility] if they have a bed available. She reports it's more "convient" for her family. Patient discussed her experience with PT and how she was not able to walk as much as she used to. Patient granted CSW verbal permission to send a SNF referral to SNFs in [facility]. CSW explained Medicare benefits to patient. Patient reports she understands that she has to be admitted at [facility] as inpatient until 02/02/16 in order for Medicare to pay for her SNF placement.  Patient reported that MD Hooten already explained her Medicare benefits to her and that she understands them.   FL2/PASRR completed and faxed to SNFs in [facility]. Awaiting bed offers.   CSW presented bed offers. Patient accepted bed at [facility]. Patient was excited about receiving a bed at [facility]. CSW contacted Maudie Mercury- admissions coordinator at [facility] to confirm bed and projected discharge date. CSW will continue to follow and assist.   Employment status:  Retired Forensic scientist:  Medicare PT Recommendations:  [facility] / Referral to community resources:  [place]  Patient/Family's Response to care:  Patient and her family are agreeable for patient to go to [place] at discharge. Patient reports that she's pleased with her care at [facility].   Patient/Family's Understanding of and Emotional Response to Diagnosis, Current Treatment, and Prognosis:  Patient understands why she's been admitted into [facility] and the care that she may need at discharge. She reports that she's appreciative of CSW explaining her benefits and arranging SNF placement at her preferred facility. Reports that she feels confident in her ability to become stronger and reports that she's motivated.   Emotional Assessment Appearance:  Appears stated age Attitude/Demeanor/Rapport:   (None) Affect (typically observed):  Accepting, Calm, Pleasant Orientation:  Oriented to Self, Oriented to Place, Oriented to  Time, Oriented to Situation Alcohol / Substance use:  Not Applicable Psych involvement (Current and /or in the  community):  No (Comment)  Discharge Needs  Concerns to be addressed:  Discharge Planning Concerns Readmission within the last 30 days:  No Current discharge risk:  Chronically ill Barriers to Discharge:  Continued Medical Work up   [employer], LCSW 01/31/2016, 4:15 PM

## 2016-01-31 NOTE — Progress Notes (Signed)
   Subjective: 1 Day Post-Op Procedure(s) (LRB): COMPUTER ASSISTED TOTAL KNEE ARTHROPLASTY (Right) Patient reports pain as moderate.   Patient is well, and has had no acute complaints or problems We will start therapy today.  Plan is to go Rehab after hospital stay. nausea and vomiting Patient denies any chest pains or shortness of breath. Objective: Vital signs in last 24 hours: Temp:  [94.9 F (34.9 C)-98.3 F (36.8 C)] 97.9 F (36.6 C) (03/16 0446) Pulse Rate:  [55-64] 57 (03/16 0446) Resp:  [12-18] 18 (03/16 0446) BP: (109-149)/(42-63) 123/42 mmHg (03/16 0446) SpO2:  [92 %-99 %] 96 % (03/16 0446) Weight:  [84.823 kg (187 lb)-91.581 kg (201 lb 14.4 oz)] 91.581 kg (201 lb 14.4 oz) (03/15 1606) Unable to evaluate wound on today's visit secondary to the original dressing still being in place. Heels are non tender and elevated off the bed using rolled towels Intake/Output from previous day: 03/15 0701 - 03/16 0700 In: 3623.3 [I.V.:3173.3; IV Piggyback:450] Out: 1291 [Urine:950; Emesis/NG output:1; Drains:290; Blood:50] Intake/Output this shift:     Recent Labs  01/31/16 0539  HGB 10.8*    Recent Labs  01/31/16 0539  WBC 7.0  RBC 3.53*  HCT 31.0*  PLT 149*    Recent Labs  01/31/16 0539  NA 133*  K 4.1  CL 104  CO2 25  BUN 14  CREATININE 0.57  GLUCOSE 118*  CALCIUM 7.9*   No results for input(s): LABPT, INR in the last 72 hours.  EXAM General - Patient is Alert, Appropriate and Oriented Extremity - Neurologically intact Neurovascular intact Sensation intact distally Intact pulses distally Dorsiflexion/Plantar flexion intact Dressing - dressing C/D/I Motor Function - intact, moving foot and toes well on exam. Able to do straight leg raise with minimal assistance.  Past Medical History  Diagnosis Date  . Allergic rhinitis, cause unspecified   . Urinary frequency   . Dizziness and giddiness   . Atherosclerosis of aorta ([place])   . Unspecified  essential hypertension   . Other and unspecified hyperlipidemia   . Degeneration of lumbar or lumbosacral intervertebral disc   . Unspecified sleep apnea   . Allergic rhinitis, cause unspecified   . Headache(784.0)   . Dysthymic disorder   . Myalgia and myositis, unspecified   . Osteoporosis, unspecified   . Trigger finger (acquired)   . Other specified disorders of bladder   . Diverticulitis of colon (without mention of hemorrhage)   . Other diseases of nasal cavity and sinuses(478.19)   . Back pain     lumbar  . Syncope     Assessment/Plan: 1 Day Post-Op Procedure(s) (LRB): COMPUTER ASSISTED TOTAL KNEE ARTHROPLASTY (Right) Active Problems:   S/P total knee arthroplasty  Estimated body mass index is 5,678.03 kg/(m^2) as calculated from the following:   Height as of this encounter: 5" (0.127 m).   Weight as of this encounter: 91.581 kg (201 lb 14.4 oz). Advance diet Up with therapy D/C IV fluids Plan for discharge tomorrow Discharge to SNF  Labs: Were reviewed. Has a very mild hypo-kalemia DVT Prophylaxis - Lovenox, Foot Pumps and TED hose Weight-Bearing as tolerated to right leg D/C O2 and Pulse OX and try on Room Air Begin working on bowel movement Labs in a.m.  Jillyn Ledger. Natoma Garrison 01/31/2016, 7:55 AM

## 2016-01-31 NOTE — Progress Notes (Signed)
Pt. Alert and oriented. VSS. Pain controlled with meds per MAR. Pt. Dangled at the bedside last PM and did great with moving. No c/o muscle spasms. Tolerating PO's. Neurochecks WDL. IS at the bedside and pt. Encouraged to use it. Foley patent and will be removed this AM. Resting quietly at this time. Will continue to monitor.

## 2016-02-01 ENCOUNTER — Encounter
Admission: RE | Admit: 2016-02-01 | Discharge: 2016-02-01 | Disposition: A | Discharge status: Home / Self Care | Payer: Medicare Other | Source: Ambulatory Visit | Attending: Internal Medicine | Admitting: Internal Medicine

## 2016-02-01 LAB — CBC
HCT: 31.7 % — ABNORMAL LOW (ref 35.0–47.0)
Hemoglobin: 10.7 g/dL — ABNORMAL LOW (ref 12.0–16.0)
MCH: 29.7 pg (ref 26.0–34.0)
MCHC: 33.9 g/dL (ref 32.0–36.0)
MCV: 87.7 fL (ref 80.0–100.0)
PLATELETS: 141 10*3/uL — AB (ref 150–440)
RBC: 3.62 MIL/uL — AB (ref 3.80–5.20)
RDW: 13.9 % (ref 11.5–14.5)
WBC: 6.7 10*3/uL (ref 3.6–11.0)

## 2016-02-01 NOTE — Progress Notes (Addendum)
CSW contacted Maudie Mercury- admissions coordinator from [facility]. She reports that she's able to accept patient on Saturday 02-01-16 requested CSW send discharge information today 01-31-16 to [facility]. All discharge information send to [facility] via [place]. Received room and report Room 215 A Report [phone]. CSW will continue to follow and assist.   Ernest Pine, MSW, [place] Work Department [phone]

## 2016-02-01 NOTE — Progress Notes (Signed)
   Subjective: 2 Days Post-Op Procedure(s) (LRB): COMPUTER ASSISTED TOTAL KNEE ARTHROPLASTY (Right) Patient reports pain as moderate.   Patient is well, and has had no acute complaints or problems Continue with physical therapy today.  Plan is to go Rehab after hospital stay. no nausea and no vomiting Patient denies any chest pains or shortness of breath. Objective: Vital signs in last 24 hours: Temp:  [97.6 F (36.4 C)-98.4 F (36.9 C)] 98.3 F (36.8 C) (03/17 0448) Pulse Rate:  [63-70] 67 (03/17 0448) Resp:  [18] 18 (03/17 0448) BP: (109-128)/(37-77) 128/42 mmHg (03/17 0448) SpO2:  [89 %-96 %] 92 % (03/17 0448) well approximated incision Heels are non tender and elevated off the bed using rolled towels Intake/Output from previous day: 03/16 0701 - 03/17 0700 In: 716.7 [I.V.:676.7] Out: 2025 [Urine:1775; Drains:250] Intake/Output this shift:     Recent Labs  01/31/16 0539 02/01/16 0605  HGB 10.8* 10.7*    Recent Labs  01/31/16 0539 02/01/16 0605  WBC 7.0 6.7  RBC 3.53* 3.62*  HCT 31.0* 31.7*  PLT 149* 141*    Recent Labs  01/31/16 0539  NA 133*  K 4.1  CL 104  CO2 25  BUN 14  CREATININE 0.57  GLUCOSE 118*  CALCIUM 7.9*   No results for input(s): LABPT, INR in the last 72 hours.  EXAM General - Patient is Alert, Appropriate and Oriented Extremity - Neurologically intact Neurovascular intact Sensation intact distally Intact pulses distally Dorsiflexion/Plantar flexion intact Dressing - dressing C/D/I Motor Function - intact, moving foot and toes well on exam.    Past Medical History  Diagnosis Date  . Allergic rhinitis, cause unspecified   . Urinary frequency   . Dizziness and giddiness   . Atherosclerosis of aorta ([place])   . Unspecified essential hypertension   . Other and unspecified hyperlipidemia   . Degeneration of lumbar or lumbosacral intervertebral disc   . Unspecified sleep apnea   . Allergic rhinitis, cause unspecified   .  Headache(784.0)   . Dysthymic disorder   . Myalgia and myositis, unspecified   . Osteoporosis, unspecified   . Trigger finger (acquired)   . Other specified disorders of bladder   . Diverticulitis of colon (without mention of hemorrhage)   . Other diseases of nasal cavity and sinuses(478.19)   . Back pain     lumbar  . Syncope     Assessment/Plan: 2 Days Post-Op Procedure(s) (LRB): COMPUTER ASSISTED TOTAL KNEE ARTHROPLASTY (Right) Active Problems:   S/P total knee arthroplasty  Estimated body mass index is 5,678.03 kg/(m^2) as calculated from the following:   Height as of this encounter: 5" (0.127 m).   Weight as of this encounter: 91.581 kg (201 lb 14.4 oz). Up with therapy Plan for discharge tomorrow Discharge to SNF  Labs: Were reviewed DVT Prophylaxis - Lovenox, Foot Pumps and TED hose Weight-Bearing as tolerated to right leg Patient needs a bowel movement today Hemovac was discontinued on today's visit  Jon R. Courtland Harrison 02/01/2016, 7:51 AM

## 2016-02-01 NOTE — Progress Notes (Signed)
Physical Therapy Treatment Patient Details Name: Helen Ramsey MRN: [id number] DOB: 12/28/1932 Today's Date: 02/01/2016    History of Present Illness Pt. is an 82 y.o. female with PMH of osteoporosis, dizziness, diverticulitis, HTN and lumbar degeneration.  Pt underwent a Right TKR (01-30-16) following a progressive decline in functioning since January. Pt. has had 2 falls in the past 6 months.    PT Comments    Pt performed in chair LE AROM and AAROM exercises.  Pt was min assist for sit to stand (2 sets: one from chair and one from raised toilet) with RW.  Pt was min assist for ambulation with RW for 15 feet to bathroom.  After pt voided in toilet, pt was min assist for ambulation with RW for 10 feet.  Pt reported she was getting tired and sat in chair that was following.  Pt did not complain of dizziness during session.  Next session PT will attempt increased ambulation.     Follow Up Recommendations  SNF     Equipment Recommendations       Recommendations for Other Services       Precautions / Restrictions Precautions Precautions: Fall Precaution Comments: Knee Imomobilizer if pt cannot perform 10 AROM straight leg raises on surgical LE. Restrictions Weight Bearing Restrictions: Yes Other Position/Activity Restrictions: WBAT    Mobility  Bed Mobility               General bed mobility comments: pt was in chair at beginning and end of session  Transfers Overall transfer level: Needs assistance Equipment used: Rolling walker (2 wheeled) Transfers: Sit to/from Stand (2 sets: one set from chair and one set from raised toilet) Sit to Stand: Min assist         General transfer comment: increased time, occasional VC's for foot and walker placement  Ambulation/Gait Ambulation/Gait assistance: Min assist Ambulation Distance (Feet): 1 set of 15 feet, 1 set of 10 feet (total 25 feet). Assistive device: Rolling walker (2 wheeled) Gait Pattern/deviations: Step-to  pattern;Decreased step length - right;Decreased stance time - right Gait velocity: decreased       Stairs            Wheelchair Mobility    Modified Rankin (Stroke Patients Only)       Balance Overall balance assessment: Needs assistance Sitting-balance support: Feet supported Sitting balance-Leahy Scale: Fair     Standing balance support: Bilateral upper extremity supported (RW) Standing balance-Leahy Scale: Fair                      Cognition Arousal/Alertness: Lethargic;Awake/alert Behavior During Therapy: WFL for tasks assessed/performed Overall Cognitive Status: Within Functional Limits for tasks assessed                      Exercises Total Joint Exercises Ankle Circles/Pumps: AROM;Both;20 reps Quad Sets: AROM;Right;10 reps Short Arc Quad: AROM;Right;10 reps Heel Slides: AAROM;10 reps;Both Hip ABduction/ADduction: AROM;10 reps;Both Straight Leg Raises: AROM;Right;AAROM (AAROM for 2 reps, AROM for 10 reps) Goniometric ROM: R Knee flexion and extension assessed sitting in chair: R Knee flexion: 75 degrees; R knee extension: -9 degrees.  All exercises performed sitting in chair.    General Comments   Nursing was contacted and cleared pt for physical therapy.  Pt was agreeable and session was modified due to fatigue.        Pertinent Vitals/Pain Pain Assessment: 0-10 Pain Score: 5  Pain Location: R Knee and head Pain Descriptors /  Indicators: Aching;Discomfort;Operative site guarding Pain Intervention(s): Limited activity within patient's tolerance;Monitored during session;Premedicated before session;Repositioned  See flow sheet for vitals.     Home Living                      Prior Function            PT Goals (current goals can now be found in the care plan section) Acute Rehab PT Goals Patient Stated Goal: To go home  PT Goal Formulation: With patient Time For Goal Achievement: 02/14/16 Potential to Achieve Goals:  Fair Progress towards PT goals: Progressing toward goals    Frequency  BID    PT Plan Current plan remains appropriate    Co-evaluation             End of Session Equipment Utilized During Treatment: Gait belt Activity Tolerance: Patient limited by fatigue Patient left: in chair;with call bell/phone within reach;with chair alarm set;with SCD's reapplied (B towel rolls under heels; polarcare activated)     Time: [id number] PT Time Calculation (min) (ACUTE ONLY): 25 min  Charges:                       G Codes:      Mittie Bodo, SPT Mittie Bodo 02/01/2016, 12:38 PM

## 2016-02-01 NOTE — Progress Notes (Signed)
CSW spoke to patient. Informed her of her room number. Inqured about transport. Per patient she would like to be transported by EMS to [facility]. CSW will continue to follow and assist.  Ernest Pine, MSW, [place] Work Department [phone]

## 2016-02-01 NOTE — Anesthesia Postprocedure Evaluation (Signed)
Anesthesia Post Note  Patient: Helen Ramsey  Procedure(s) Performed: Procedure(s) (LRB): COMPUTER ASSISTED TOTAL KNEE ARTHROPLASTY (Right)  Patient location during evaluation: PACU Anesthesia Type: General Level of consciousness: awake and alert Pain management: pain level controlled Vital Signs Assessment: post-procedure vital signs reviewed and stable Respiratory status: spontaneous breathing, nonlabored ventilation, respiratory function stable and patient connected to nasal cannula oxygen Cardiovascular status: blood pressure returned to baseline and stable Postop Assessment: no signs of nausea or vomiting Anesthetic complications: no    Last Vitals:  Filed Vitals:   02/01/16 1356 02/01/16 1506  BP:  144/62  Pulse: 75 69  Temp:  37.5 C  Resp:  18    Last Pain:  Filed Vitals:   02/01/16 1855  PainSc: [place]

## 2016-02-01 NOTE — Progress Notes (Signed)
Physical Therapy Treatment Patient Details Name: Helen Ramsey MRN: [id number] DOB: February 21, 1933 Today's Date: 02/01/2016    History of Present Illness Pt. is an 82 y.o. female with PMH of osteoporosis, dizziness, diverticulitis, HTN and lumbar degeneration.  Pt underwent a Right TKR (01-30-16) following a progressive decline in functioning since January. Pt. has had 2 falls in the past 6 months.    PT Comments    Pt reported that she was tired this afternoon.  Pt was CGA for rolling and min assist for sidelying to sit and sit to sidelying.  Pt required increased time and intermittent VC's for LE placement during bed mobility.  Pt was min assist for ambulation with RW for 40 feet.  Pt required increased time and occasional VC's and tactile cues for walker placement during ambulation.  Pt performed AROM exercises sitting EOB.  Next session PT will attempt to increase ambulation distance.   Follow Up Recommendations  SNF     Equipment Recommendations       Recommendations for Other Services       Precautions / Restrictions Precautions Precautions: Fall Precaution Comments: Knee Imomobilizer if pt cannot perform 10 AROM straight leg raises on surgical LE. Restrictions Weight Bearing Restrictions: Yes Other Position/Activity Restrictions: WBAT    Mobility  Bed Mobility Overal bed mobility: Needs Assistance Bed Mobility: Rolling;Sit to Sidelying;Sidelying to Sit Rolling: Min guard Sidelying to sit: Min assist     Sit to sidelying: Min assist General bed mobility comments: Required extra time, intermittent VC's for LE placement   Transfers Overall transfer level: Needs assistance Equipment used: Rolling walker (2 wheeled) Transfers: Sit to/from Stand Sit to Stand: Min assist         General transfer comment: increased time, occasional VC's for foot and walker placement  Ambulation/Gait Ambulation/Gait assistance: Min assist Ambulation Distance (Feet): 40  Feet Assistive device: Rolling walker (2 wheeled) Gait Pattern/deviations: Step-to pattern;Decreased step length - right;Decreased stance time - right Gait velocity: decreased       Stairs            Wheelchair Mobility    Modified Rankin (Stroke Patients Only)       Balance Overall balance assessment: Needs assistance Sitting-balance support: Feet supported Sitting balance-Leahy Scale: Fair     Standing balance support: Bilateral upper extremity supported (RW) Standing balance-Leahy Scale: Fair                      Cognition Arousal/Alertness: Lethargic;Awake/alert Behavior During Therapy: WFL for tasks assessed/performed Overall Cognitive Status: Within Functional Limits for tasks assessed                      Exercises Total Joint Exercises Ankle Circles/Pumps: AROM;Both;20 reps Hip ABduction/ADduction: AROM;10 reps;Both Long Arc Quad: AROM;Both;10 reps (R LE was able to AROM 75% of total AROM compared to L LE )     General Comments   Nursing was contacted and cleared pt for physical therapy.  Pt was agreeable and session was modified due to fatigue.  Nursing was present near the end of session. Pt's family member was present at the end of the session.         Pertinent Vitals/Pain Pain Assessment: 0-10 Pain Score: 5  Pain Location: R Knee Pain Descriptors / Indicators: Aching;Discomfort;Operative site guarding Pain Intervention(s): Limited activity within patient's tolerance;Monitored during session;Repositioned;RN gave pain meds during session  See flow sheet for vitals.     Home Living  Prior Function            PT Goals (current goals can now be found in the care plan section) Acute Rehab PT Goals Patient Stated Goal: To go home  PT Goal Formulation: With patient Time For Goal Achievement: 02/14/16 Potential to Achieve Goals: Fair Additional Goals Additional Goal #1: Pt will achieve PROM 0-90  degrees on R knee by discharge  Progress towards PT goals: Progressing toward goals    Frequency  BID    PT Plan Current plan remains appropriate    Co-evaluation             End of Session Equipment Utilized During Treatment: Gait belt Activity Tolerance: Patient limited by fatigue Patient left: in bed;with call bell/phone within reach;with bed alarm set;with family/visitor present;with nursing/sitter in room;with SCD's reapplied (B towel rolls under heels, polarcare activated)     Time: [id number] PT Time Calculation (min) (ACUTE ONLY): 24 min  Charges:                     G Codes:      Mittie Bodo, SPT Mittie Bodo 02/01/2016, 2:13 PM

## 2016-02-01 NOTE — Care Management Important Message (Signed)
Important Message  Patient Details  Name: Helen Ramsey MRN: [id number] Date of Birth: Sep 05, 1933   Medicare Important Message Given:  Yes    Marshell Garfinkel, RN 02/01/2016, 10:29 AM

## 2016-02-02 DIAGNOSIS — F419 Anxiety disorder, unspecified: Secondary | ICD-10-CM | POA: Diagnosis not present

## 2016-02-02 DIAGNOSIS — Z96651 Presence of right artificial knee joint: Secondary | ICD-10-CM | POA: Diagnosis not present

## 2016-02-02 DIAGNOSIS — J309 Allergic rhinitis, unspecified: Secondary | ICD-10-CM | POA: Diagnosis not present

## 2016-02-02 DIAGNOSIS — M6281 Muscle weakness (generalized): Secondary | ICD-10-CM | POA: Diagnosis not present

## 2016-02-02 DIAGNOSIS — K219 Gastro-esophageal reflux disease without esophagitis: Secondary | ICD-10-CM | POA: Diagnosis not present

## 2016-02-02 DIAGNOSIS — L0390 Cellulitis, unspecified: Secondary | ICD-10-CM | POA: Diagnosis not present

## 2016-02-02 DIAGNOSIS — E784 Other hyperlipidemia: Secondary | ICD-10-CM | POA: Diagnosis not present

## 2016-02-02 DIAGNOSIS — E559 Vitamin D deficiency, unspecified: Secondary | ICD-10-CM | POA: Diagnosis not present

## 2016-02-02 DIAGNOSIS — M25561 Pain in right knee: Secondary | ICD-10-CM | POA: Diagnosis not present

## 2016-02-02 DIAGNOSIS — Z9181 History of falling: Secondary | ICD-10-CM | POA: Diagnosis not present

## 2016-02-02 DIAGNOSIS — R262 Difficulty in walking, not elsewhere classified: Secondary | ICD-10-CM | POA: Diagnosis not present

## 2016-02-02 DIAGNOSIS — R279 Unspecified lack of coordination: Secondary | ICD-10-CM | POA: Diagnosis not present

## 2016-02-02 DIAGNOSIS — T148 Other injury of unspecified body region: Secondary | ICD-10-CM | POA: Diagnosis not present

## 2016-02-02 DIAGNOSIS — L03115 Cellulitis of right lower limb: Secondary | ICD-10-CM | POA: Diagnosis not present

## 2016-02-02 DIAGNOSIS — M810 Age-related osteoporosis without current pathological fracture: Secondary | ICD-10-CM | POA: Diagnosis not present

## 2016-02-02 DIAGNOSIS — M159 Polyosteoarthritis, unspecified: Secondary | ICD-10-CM | POA: Diagnosis not present

## 2016-02-02 DIAGNOSIS — Z471 Aftercare following joint replacement surgery: Secondary | ICD-10-CM | POA: Diagnosis not present

## 2016-02-02 DIAGNOSIS — T814XXD Infection following a procedure, subsequent encounter: Secondary | ICD-10-CM | POA: Diagnosis not present

## 2016-02-02 DIAGNOSIS — I10 Essential (primary) hypertension: Secondary | ICD-10-CM | POA: Diagnosis not present

## 2016-02-02 DIAGNOSIS — F329 Major depressive disorder, single episode, unspecified: Secondary | ICD-10-CM | POA: Diagnosis not present

## 2016-02-02 NOTE — Progress Notes (Signed)
Patient has orders to d/c to [facility]. Nurse already notified.  [facility] is aware of discharge and is expecting patient. Discharge orders completed and faxed to facility. Discharge packet completed and placed next to patients chart. Patient will be leaving by EMS. Patient room number is 215A, contact number for report is [phone].  CSW notified patient. Patient notified family of d/c via phone. Patient agreed to d/c.     Anitra Lauth, BSW, MSW, [place] Work Dept [phone]

## 2016-02-02 NOTE — Clinical Social Work Placement (Signed)
   CLINICAL SOCIAL WORK PLACEMENT  NOTE  Date:  02/02/2016  Patient Details  Name: Helen Ramsey MRN: [id number] Date of Birth: [birth year]  Clinical Social Work is seeking post-discharge placement for this patient at the [place] level of care (*CSW will initial, date and re-position this form in  chart as items are completed):  No   Patient/family provided with [place] Work Department's list of facilities offering this level of care within the geographic area requested by the patient (or if unable, by the patient's family).  Yes   Patient/family informed of their freedom to choose among providers that offer the needed level of care, that participate in Medicare, Medicaid or managed care program needed by the patient, have an available bed and are willing to accept the patient.  Yes   Patient/family informed of [place]'s ownership interest in [facility] and [facility], as well as of the fact that they are under no obligation to receive care at these facilities.  PASRR submitted to EDS on 01/30/16     PASRR number received on 01/31/16     Existing PASRR number confirmed on       FL2 transmitted to all facilities in geographic area requested by pt/family on 01/31/16     FL2 transmitted to all facilities within larger geographic area on       Patient informed that his/her managed care company has contracts with or will negotiate with certain facilities, including the following:        Yes   Patient/family informed of bed offers received.  Patient [facility] bed at  [facility]     Physician recommends and patient chooses bed at      Patient to be transferred to  [facility] ) on 02/02/16.  Patient to be transferred to facility by  (EMS)     Patient family notified on 02/02/16 of transfer.  Name of family member notified:   (Patient notified family via phone)     PHYSICIAN       Additional Comment:     _______________________________________________ Micah Flesher, LCSW 02/02/2016, 11:04 AM

## 2016-02-02 NOTE — Progress Notes (Signed)
   Subjective: 3 Days Post-Op Procedure(s) (LRB): COMPUTER ASSISTED TOTAL KNEE ARTHROPLASTY (Right) Patient reports pain as 4 on 0-10 scale.   Patient is well, and has had no acute complaints or problems Continue with physical therapy today.  Plan is to go Rehab after hospital stay. no nausea and no vomiting Patient denies any chest pains or shortness of breath. Objective: Vital signs in last 24 hours: Temp:  [97.9 F (36.6 C)-99.8 F (37.7 C)] 97.9 F (36.6 C) (03/18 0750) Pulse Rate:  [63-75] 70 (03/18 0750) Resp:  [16-18] 18 (03/18 0750) BP: (144-166)/(50-62) 155/50 mmHg (03/18 0750) SpO2:  [92 %-97 %] 97 % (03/18 0750) well approximated incision Heels are non tender and elevated off the bed using rolled towels Intake/Output from previous day: 03/17 0701 - 03/18 0700 In: -  Out: 1775 [Urine:1775] Intake/Output this shift:     Recent Labs  01/31/16 0539 02/01/16 0605  HGB 10.8* 10.7*    Recent Labs  01/31/16 0539 02/01/16 0605  WBC 7.0 6.7  RBC 3.53* 3.62*  HCT 31.0* 31.7*  PLT 149* 141*    Recent Labs  01/31/16 0539  NA 133*  K 4.1  CL 104  CO2 25  BUN 14  CREATININE 0.57  GLUCOSE 118*  CALCIUM 7.9*   No results for input(s): LABPT, INR in the last 72 hours.  EXAM General - Patient is Alert, Appropriate and Oriented Extremity - Neurologically intact Neurovascular intact Sensation intact distally Intact pulses distally Dorsiflexion/Plantar flexion intact Dressing - dressing C/D/I Motor Function - intact, moving foot and toes well on exam.    Past Medical History  Diagnosis Date  . Allergic rhinitis, cause unspecified   . Urinary frequency   . Dizziness and giddiness   . Atherosclerosis of aorta ([place])   . Unspecified essential hypertension   . Other and unspecified hyperlipidemia   . Degeneration of lumbar or lumbosacral intervertebral disc   . Unspecified sleep apnea   . Allergic rhinitis, cause unspecified   . Headache(784.0)   .  Dysthymic disorder   . Myalgia and myositis, unspecified   . Osteoporosis, unspecified   . Trigger finger (acquired)   . Other specified disorders of bladder   . Diverticulitis of colon (without mention of hemorrhage)   . Other diseases of nasal cavity and sinuses(478.19)   . Back pain     lumbar  . Syncope     Assessment/Plan: 3 Days Post-Op Procedure(s) (LRB): COMPUTER ASSISTED TOTAL KNEE ARTHROPLASTY (Right) Active Problems:   S/P total knee arthroplasty  Estimated body mass index is 5,678.03 kg/(m^2) as calculated from the following:   Height as of this encounter: 5" (0.127 m).   Weight as of this encounter: 91.581 kg (201 lb 14.4 oz). Up with therapy Discharge to SNF today  Labs: No labs to review DVT Prophylaxis - Lovenox, Foot Pumps and TED hose Weight-Bearing as tolerated to right leg Change dressing as needed. Polar care to be worn at all times. TED stockings are to be worn 24 hours a day but may be removed one hour per 8 hour shift. Lovenox 30 mg subcutaneous every 12 hours 14 days Do not get the dressing wet until the staples are removed at 2 weeks. Patient will follow-up in [facility] in 2 weeks for which she already has an appointment. Staples were removed in [facility] in 2 weeks  Jillyn Ledger. Albin [place] 02/02/2016, 8:56 AM

## 2016-02-02 NOTE — Progress Notes (Signed)
Physical Therapy Treatment Patient Details Name: Helen Ramsey MRN: [id number] DOB: Nov 07, 1933 Today's Date: 02/02/2016    History of Present Illness Pt. is an 82 y.o. female with PMH of osteoporosis, dizziness, diverticulitis, HTN and lumbar degeneration.  Pt underwent a Right TKR (01-30-16) following a progressive decline in functioning since January. Pt. has had 2 falls in the past 6 months.    PT Comments    Pt is making progress toward goals today as evident in improved gait distance, high level of indep with mobility, and improved tolerance to ambulation. Pt remains most limited by pain, and is having difficulty with improving her flexion ROM. Will continue with POC as previously indicated. Pt expected to DC today. Given education on performing sustained (15-30 min) knee flexion stretch 3-4x daily while seated.   Follow Up Recommendations  SNF     Equipment Recommendations       Recommendations for Other Services       Precautions / Restrictions Precautions Precaution Comments: Knee Imomobilizer if pt cannot perform 10 AROM straight leg raises on surgical LE. Restrictions Weight Bearing Restrictions: Yes RLE Weight Bearing: Weight bearing as tolerated    Mobility  Bed Mobility               General bed mobility comments: Pt received in chair.   Transfers Overall transfer level: Needs assistance Equipment used: Rolling walker (2 wheeled) Transfers: Sit to/from Stand Sit to Stand: Supervision            Ambulation/Gait Ambulation/Gait assistance: Min guard Ambulation Distance (Feet): 60 Feet Assistive device: Rolling walker (2 wheeled)     Gait velocity interpretation: <1.8 ft/sec, indicative of risk for recurrent falls     Stairs            Wheelchair Mobility    Modified Rankin (Stroke Patients Only)       Balance Overall balance assessment: Modified Independent;History of Falls                                   Cognition Arousal/Alertness: Awake/alert Behavior During Therapy: WFL for tasks assessed/performed Overall Cognitive Status: Within Functional Limits for tasks assessed                      Exercises Total Joint Exercises Ankle Circles/Pumps: AROM;Both;20 reps Short Arc Quad: AROM;Right;15 reps Knee Flexion: AAROM;Right;15 reps Goniometric ROM: R knee flexion: 0-75 degrees.     General Comments        Pertinent Vitals/Pain Pain Assessment: 0-10 Pain Score: 5  Pain Location: R knee  Pain Intervention(s): Limited activity within patient's tolerance;Monitored during session;Premedicated before session;Repositioned;Ice applied    Home Living                      Prior Function            PT Goals (current goals can now be found in the care plan section) Acute Rehab PT Goals Patient Stated Goal: To go home  PT Goal Formulation: With patient Time For Goal Achievement: 02/14/16 Potential to Achieve Goals: Poor Additional Goals Additional Goal #1: Pt will achieve PROM 0-90 degrees on R knee by discharge  Progress towards PT goals: Progressing toward goals    Frequency  BID    PT Plan Current plan remains appropriate    Co-evaluation  End of Session Equipment Utilized During Treatment: Gait belt Activity Tolerance: Patient tolerated treatment well;Patient limited by pain Patient left: with call bell/phone within reach;with nursing/sitter in room;with SCD's reapplied;in chair;with chair alarm set     Time: [id number] PT Time Calculation (min) (ACUTE ONLY): 15 min  Charges:  $Therapeutic Activity: 8-22 mins                    G Codes:     9:25 AM, 2016-02-22 Etta Grandchild, PT, DPT PRN Physical Therapist - [place] License # [id number] ([phone] (mobile)

## 2016-02-05 DIAGNOSIS — I10 Essential (primary) hypertension: Secondary | ICD-10-CM | POA: Diagnosis not present

## 2016-02-05 DIAGNOSIS — M159 Polyosteoarthritis, unspecified: Secondary | ICD-10-CM | POA: Diagnosis not present

## 2016-02-05 DIAGNOSIS — L0390 Cellulitis, unspecified: Secondary | ICD-10-CM | POA: Diagnosis not present

## 2016-02-08 DIAGNOSIS — L0390 Cellulitis, unspecified: Secondary | ICD-10-CM | POA: Diagnosis not present

## 2016-02-12 LAB — COMPREHENSIVE METABOLIC PANEL
ALK PHOS: 54 U/L (ref 38–126)
ALT: 19 U/L (ref 14–54)
ANION GAP: 7 (ref 5–15)
AST: 23 U/L (ref 15–41)
Albumin: 3.9 g/dL (ref 3.5–5.0)
BUN: 13 mg/dL (ref 6–20)
CHLORIDE: 101 mmol/L (ref 101–111)
CO2: 24 mmol/L (ref 22–32)
CREATININE: 0.69 mg/dL (ref 0.44–1.00)
Calcium: 9.3 mg/dL (ref 8.9–10.3)
Glucose, Bld: 98 mg/dL (ref 65–99)
Potassium: 4.3 mmol/L (ref 3.5–5.1)
Sodium: 132 mmol/L — ABNORMAL LOW (ref 135–145)
Total Bilirubin: 0.4 mg/dL (ref 0.3–1.2)
Total Protein: 7.6 g/dL (ref 6.5–8.1)

## 2016-02-12 LAB — CBC WITH DIFFERENTIAL/PLATELET
Basophils Absolute: 0.1 10*3/uL (ref 0–0.1)
Basophils Relative: 1 %
EOS PCT: 3 %
Eosinophils Absolute: 0.3 10*3/uL (ref 0–0.7)
HCT: 36.8 % (ref 35.0–47.0)
Hemoglobin: 12.5 g/dL (ref 12.0–16.0)
LYMPHS ABS: 1.6 10*3/uL (ref 1.0–3.6)
LYMPHS PCT: 17 %
MCH: 29.3 pg (ref 26.0–34.0)
MCHC: 33.9 g/dL (ref 32.0–36.0)
MCV: 86.6 fL (ref 80.0–100.0)
MONOS PCT: 11 %
Monocytes Absolute: 1 10*3/uL — ABNORMAL HIGH (ref 0.2–0.9)
NEUTROS PCT: 68 %
Neutro Abs: 6.4 10*3/uL (ref 1.4–6.5)
PLATELETS: 312 10*3/uL (ref 150–440)
RBC: 4.25 MIL/uL (ref 3.80–5.20)
RDW: 13.7 % (ref 11.5–14.5)
WBC: 9.3 10*3/uL (ref 3.6–11.0)

## 2016-02-14 DIAGNOSIS — Z96659 Presence of unspecified artificial knee joint: Secondary | ICD-10-CM | POA: Insufficient documentation

## 2016-02-16 ENCOUNTER — Encounter
Admission: RE | Admit: 2016-02-16 | Discharge: 2016-02-16 | Disposition: A | Discharge status: Home / Self Care | Payer: Medicare Other | Source: Ambulatory Visit | Attending: Internal Medicine | Admitting: Internal Medicine

## 2016-02-18 LAB — CBC WITH DIFFERENTIAL/PLATELET
BASOS ABS: 0.1 10*3/uL (ref 0–0.1)
BASOS PCT: 1 %
EOS ABS: 0.3 10*3/uL (ref 0–0.7)
EOS PCT: 5 %
HCT: 37.3 % (ref 35.0–47.0)
Hemoglobin: 12.6 g/dL (ref 12.0–16.0)
LYMPHS ABS: 1.4 10*3/uL (ref 1.0–3.6)
Lymphocytes Relative: 25 %
MCH: 29.4 pg (ref 26.0–34.0)
MCHC: 33.9 g/dL (ref 32.0–36.0)
MCV: 86.6 fL (ref 80.0–100.0)
Monocytes Absolute: 0.8 10*3/uL (ref 0.2–0.9)
Monocytes Relative: 13 %
Neutro Abs: 3.2 10*3/uL (ref 1.4–6.5)
Neutrophils Relative %: 56 %
PLATELETS: 289 10*3/uL (ref 150–440)
RBC: 4.3 MIL/uL (ref 3.80–5.20)
RDW: 14.2 % (ref 11.5–14.5)
WBC: 5.7 10*3/uL (ref 3.6–11.0)

## 2016-02-18 LAB — COMPREHENSIVE METABOLIC PANEL
ALT: 17 U/L (ref 14–54)
AST: 20 U/L (ref 15–41)
Albumin: 3.9 g/dL (ref 3.5–5.0)
Alkaline Phosphatase: 66 U/L (ref 38–126)
Anion gap: 5 (ref 5–15)
BUN: 15 mg/dL (ref 6–20)
CHLORIDE: 101 mmol/L (ref 101–111)
CO2: 30 mmol/L (ref 22–32)
CREATININE: 0.61 mg/dL (ref 0.44–1.00)
Calcium: 9.7 mg/dL (ref 8.9–10.3)
GFR calc non Af Amer: >60 mL/min (ref >60)
Glucose, Bld: 105 mg/dL — ABNORMAL HIGH (ref 65–99)
Potassium: 3.7 mmol/L (ref 3.5–5.1)
SODIUM: 136 mmol/L (ref 135–145)
Total Bilirubin: 0.3 mg/dL (ref 0.3–1.2)
Total Protein: 7.5 g/dL (ref 6.5–8.1)

## 2016-02-20 ENCOUNTER — Encounter: Payer: Medicare Other | Admitting: Family Medicine

## 2016-02-20 DIAGNOSIS — M25561 Pain in right knee: Secondary | ICD-10-CM | POA: Diagnosis not present

## 2016-02-22 DIAGNOSIS — M25561 Pain in right knee: Secondary | ICD-10-CM | POA: Diagnosis not present

## 2016-02-25 DIAGNOSIS — M25661 Stiffness of right knee, not elsewhere classified: Secondary | ICD-10-CM | POA: Diagnosis not present

## 2016-02-27 DIAGNOSIS — M25661 Stiffness of right knee, not elsewhere classified: Secondary | ICD-10-CM | POA: Diagnosis not present

## 2016-03-03 DIAGNOSIS — M25661 Stiffness of right knee, not elsewhere classified: Secondary | ICD-10-CM | POA: Diagnosis not present

## 2016-03-04 DIAGNOSIS — R2689 Other abnormalities of gait and mobility: Secondary | ICD-10-CM | POA: Diagnosis not present

## 2016-03-04 DIAGNOSIS — R4189 Other symptoms and signs involving cognitive functions and awareness: Secondary | ICD-10-CM | POA: Diagnosis not present

## 2016-03-04 DIAGNOSIS — G4733 Obstructive sleep apnea (adult) (pediatric): Secondary | ICD-10-CM | POA: Diagnosis not present

## 2016-03-04 DIAGNOSIS — M5481 Occipital neuralgia: Secondary | ICD-10-CM | POA: Diagnosis not present

## 2016-03-05 ENCOUNTER — Other Ambulatory Visit: Payer: Self-pay | Admitting: Family Medicine

## 2016-03-05 DIAGNOSIS — M810 Age-related osteoporosis without current pathological fracture: Secondary | ICD-10-CM

## 2016-03-05 DIAGNOSIS — M25661 Stiffness of right knee, not elsewhere classified: Secondary | ICD-10-CM | POA: Diagnosis not present

## 2016-03-07 DIAGNOSIS — M25661 Stiffness of right knee, not elsewhere classified: Secondary | ICD-10-CM | POA: Diagnosis not present

## 2016-03-10 DIAGNOSIS — M25661 Stiffness of right knee, not elsewhere classified: Secondary | ICD-10-CM | POA: Diagnosis not present

## 2016-03-12 DIAGNOSIS — R42 Dizziness and giddiness: Secondary | ICD-10-CM | POA: Diagnosis not present

## 2016-03-13 DIAGNOSIS — M25661 Stiffness of right knee, not elsewhere classified: Secondary | ICD-10-CM | POA: Diagnosis not present

## 2016-03-13 DIAGNOSIS — Z96651 Presence of right artificial knee joint: Secondary | ICD-10-CM | POA: Diagnosis not present

## 2016-03-13 DIAGNOSIS — F331 Major depressive disorder, recurrent, moderate: Secondary | ICD-10-CM | POA: Diagnosis not present

## 2016-03-13 DIAGNOSIS — F411 Generalized anxiety disorder: Secondary | ICD-10-CM | POA: Diagnosis not present

## 2016-03-19 ENCOUNTER — Other Ambulatory Visit: Payer: Self-pay | Admitting: Family Medicine

## 2016-03-19 DIAGNOSIS — I10 Essential (primary) hypertension: Secondary | ICD-10-CM

## 2016-03-20 DIAGNOSIS — R51 Headache: Secondary | ICD-10-CM | POA: Diagnosis not present

## 2016-03-20 HISTORY — PX: OTHER SURGICAL HISTORY: SHX169

## 2016-03-31 ENCOUNTER — Encounter: Payer: Self-pay | Admitting: Family Medicine

## 2016-03-31 ENCOUNTER — Ambulatory Visit (INDEPENDENT_AMBULATORY_CARE_PROVIDER_SITE_OTHER): Payer: Medicare Other | Admitting: Family Medicine

## 2016-03-31 VITALS — BP 120/66 | HR 80 | Temp 98.4°F | Resp 16 | Ht 59.5 in | Wt 183.0 lb

## 2016-03-31 DIAGNOSIS — M1990 Unspecified osteoarthritis, unspecified site: Secondary | ICD-10-CM | POA: Diagnosis not present

## 2016-03-31 DIAGNOSIS — S93402A Sprain of unspecified ligament of left ankle, initial encounter: Secondary | ICD-10-CM

## 2016-03-31 DIAGNOSIS — F329 Major depressive disorder, single episode, unspecified: Secondary | ICD-10-CM

## 2016-03-31 DIAGNOSIS — F419 Anxiety disorder, unspecified: Secondary | ICD-10-CM

## 2016-03-31 DIAGNOSIS — Z1231 Encounter for screening mammogram for malignant neoplasm of breast: Secondary | ICD-10-CM | POA: Diagnosis not present

## 2016-03-31 DIAGNOSIS — K219 Gastro-esophageal reflux disease without esophagitis: Secondary | ICD-10-CM | POA: Diagnosis not present

## 2016-03-31 DIAGNOSIS — E785 Hyperlipidemia, unspecified: Secondary | ICD-10-CM

## 2016-03-31 DIAGNOSIS — F418 Other specified anxiety disorders: Secondary | ICD-10-CM

## 2016-03-31 DIAGNOSIS — M549 Dorsalgia, unspecified: Secondary | ICD-10-CM | POA: Insufficient documentation

## 2016-03-31 DIAGNOSIS — F32A Depression, unspecified: Secondary | ICD-10-CM

## 2016-03-31 DIAGNOSIS — Z0000 Encounter for general adult medical examination without abnormal findings: Secondary | ICD-10-CM

## 2016-03-31 MED ORDER — ATORVASTATIN CALCIUM 20 MG PO TABS: 20.0000 mg | ORAL_TABLET | Freq: Every day | ORAL | Status: DC

## 2016-03-31 MED ORDER — OMEPRAZOLE 20 MG PO CPDR: 20.0000 mg | DELAYED_RELEASE_CAPSULE | Freq: Every day | ORAL | Status: DC

## 2016-03-31 MED ORDER — TRAMADOL HCL 50 MG PO TABS
50.0000 mg | ORAL_TABLET | Freq: Two times a day (BID) | ORAL | Status: DC | PRN
Start: 2016-03-31 — End: 2016-05-09

## 2016-03-31 MED ORDER — TRAMADOL HCL 50 MG PO TABS: 50.0000 mg | ORAL_TABLET | Freq: Two times a day (BID) | ORAL | Status: DC | PRN

## 2016-03-31 NOTE — Patient Instructions (Signed)
Generic Ankle Exercises EXERCISES RANGE OF MOTION (ROM) AND STRETCHING EXERCISES These exercises may help you when beginning to rehabilitate your injury. Your symptoms may resolve with or without further involvement from your physician, physical therapist or athletic trainer. While completing these exercises, remember:   Restoring tissue flexibility helps normal motion to return to the joints. This allows healthier, less painful movement and activity.  An effective stretch should be held for at least 30 seconds.  A stretch should never be painful. You should only feel a gentle lengthening or release in the stretched tissue. RANGE OF MOTION - Dorsi/Plantar Flexion  While sitting with your right / left knee straight, draw the top of your foot upwards by flexing your ankle. Then reverse the motion, pointing your toes downward.  Hold each position for __________ seconds.  After completing your first set of exercises, repeat this exercise with your knee bent. Repeat __________ times. Complete this exercise __________ times per day.  RANGE OF MOTION - Ankle Alphabet  Imagine your right / left big toe is a pen.  Keeping your hip and knee still, write out the entire alphabet with your "pen." Make the letters as large as you can without increasing any discomfort. Repeat __________ times. Complete this exercise __________ times per day.  RANGE OF MOTION - Ankle Dorsiflexion, Active Assisted   Remove shoes and sit on a chair that is preferably not on a carpeted surface.  Place right / left foot under knee. Extend your opposite leg for support.  Keeping your heel down, slide your right / left foot back toward the chair until you feel a stretch at your ankle or calf. If you do not feel a stretch, slide your bottom forward to the edge of the chair while still keeping your heel down.  Hold this stretch for __________ seconds. Repeat __________ times. Complete this stretch __________ times per day.    STRENGTHENING EXERCISES  These exercises may help you when beginning to rehabilitate your injury. They may resolve your symptoms with or without further involvement from your physician, physical therapist or athletic trainer. While completing these exercises, remember:   Muscles can gain both the endurance and the strength needed for everyday activities through controlled exercises.  Complete these exercises as instructed by your physician, physical therapist or athletic trainer. Progress the resistance and repetitions only as guided.  You may experience muscle soreness or fatigue, but the pain or discomfort you are trying to eliminate should never worsen during these exercises. If this pain does worsen, stop and make certain you are following the directions exactly. If the pain is still present after adjustments, discontinue the exercise until you can discuss the trouble with your clinician. STRENGTH - Dorsiflexors  Secure a rubber exercise band/tubing to a fixed object (table, pole) and loop the other end around your right / left foot.  Sit on the floor facing the fixed object. The band/tubing should be slightly tense when your foot is relaxed.  Slowly draw your foot back toward you using your ankle and toes.  Hold this position for __________ seconds. Slowly release the tension in the band and return your foot to the starting position. Repeat __________ times. Complete this exercise __________ times per day.  STRENGTH - Plantar-flexors  Sit with your right / left leg extended. Holding onto both ends of a rubber exercise band/tubing, loop it around the ball of your foot. Keep a slight tension in the band.  Slowly push your toes away from you, pointing  them downward.  Hold this position for __________ seconds. Return slowly, controlling the tension in the band/tubing. Repeat __________ times. Complete this exercise __________ times per day.  STRENGTH - Ankle Eversion  Secure one end of  a rubber exercise band/tubing to a fixed object (table, pole). Loop the other end around your foot just before your toes.  Place your fists between your knees. This will focus your strengthening at your ankle.  Drawing the band/tubing across your opposite foot, slowly, pull your little toe out and up. Make sure the band/tubing is positioned to resist the entire motion.  Hold this position for __________ seconds.  Have your muscles resist the band/tubing as it slowly pulls your foot back to the starting position. Repeat __________ times. Complete this exercise __________ times per day.  STRENGTH - Ankle Inversion  Secure one end of a rubber exercise band/tubing to a fixed object (table, pole). Loop the other end around your foot just before your toes.  Place your fists between your knees. This will focus your strengthening at your ankle.  Slowly, pull your big toe up and in, making sure the band/tubing is positioned to resist the entire motion.  Hold this position for __________ seconds.  Have your muscles resist the band/tubing as it slowly pulls your foot back to the starting position. Repeat __________ times. Complete this exercises __________ times per day.  STRENGTH - Towel Curls  Sit in a chair positioned on a non-carpeted surface.  Place your foot on a towel, keeping your heel on the floor.  Pull the towel toward your heel by only curling your toes. Keep your heel on the floor. If instructed by your physician, physical therapist or athletic trainer, add weight to the end of the towel. Repeat __________ times. Complete this exercise __________ times per day. STRENGTH - Plantar-flexors, Standing  Stand with your feet shoulder width apart. Steady yourself with a wall or table using as little support as needed.  Keeping your weight evenly spread over the width of your feet, rise up on your toes.*  Hold this position for __________ seconds. Repeat __________ times. Complete  this exercise __________ times per day.  *If this is too easy, shift your weight toward your right / left leg until you feel challenged. Ultimately, you may be asked to do this exercise with your right / left foot only. BALANCE - Tandem Walking  Place your uninjured foot on a line 2-4 inches wide and at least 10 feet long.  Keeping your balance without using anything for extra support, place your right / left heel directly in front of your other foot.  Slowly raise your back foot up, lifting from the heel to the toes, and place it directly in front of the right / left foot.  Continue to walk along the line slowly. Walk for ____________________ feet. Repeat ____________________ times. Complete ____________________ times per day.   This information is not intended to replace advice given to you by your health care provider. Make sure you discuss any questions you have with your health care provider.   Document Released: 09/17/2005 Document Revised: 11/24/2014 Document Reviewed: 02/15/2009 Elsevier Interactive Patient Education [employer].

## 2016-03-31 NOTE — Progress Notes (Signed)
Patient ID: Helen Ramsey, female   DOB: 11-19-1932, 82 y.o.   MRN: [id number]       Patient: Helen Ramsey, Female    DOB: 1933/01/11, 82 y.o.   MRN: [id number] Visit Date: 03/31/2016  Today's Provider: Margarita Rana, MD   Chief Complaint  Patient presents with  . Medicare Wellness   Subjective:    Annual wellness visit Helen Ramsey is a 82 y.o. female. She feels well. She reports exercising daily. She reports she is sleeping well.  03/07/15 Mammogram-BI-RADS 1 03/07/15 BMD-borderline osteoporsis -----------------------------------------------------------   Review of Systems  Constitutional: Positive for fatigue.  HENT: Positive for drooling, rhinorrhea, sinus pressure and sneezing.   Eyes: Positive for photophobia.  Respiratory: Negative.   Cardiovascular: Positive for leg swelling.  Gastrointestinal: Positive for constipation.  Endocrine: Positive for polyuria.  Genitourinary: Positive for urgency and enuresis.  Musculoskeletal: Positive for back pain, arthralgias and neck pain.  Skin: Negative.   Allergic/Immunologic: Negative.   Neurological: Positive for dizziness, tremors, syncope, light-headedness, numbness and headaches.  Hematological: Bruises/bleeds easily.  Psychiatric/Behavioral: Positive for confusion, sleep disturbance and decreased concentration. The patient is nervous/anxious.     Social History   Social History  . Marital Status: Widowed    Spouse Name: N/A  . Number of Children: N/A  . Years of Education: N/A   Occupational History  . Not on file.   Social History Main Topics  . Smoking status: Never Smoker   . Smokeless tobacco: Never Used  . Alcohol Use: No  . Drug Use: No  . Sexual Activity: Not on file   Other Topics Concern  . Not on file   Social History Narrative    Past Medical History  Diagnosis Date  . Allergic rhinitis, cause unspecified   . Urinary frequency   . Dizziness and giddiness   . Atherosclerosis of  aorta ([place])   . Unspecified essential hypertension   . Other and unspecified hyperlipidemia   . Degeneration of lumbar or lumbosacral intervertebral disc   . Unspecified sleep apnea   . Allergic rhinitis, cause unspecified   . Headache(784.0)   . Dysthymic disorder   . Myalgia and myositis, unspecified   . Osteoporosis, unspecified   . Trigger finger (acquired)   . Other specified disorders of bladder   . Diverticulitis of colon (without mention of hemorrhage)   . Other diseases of nasal cavity and sinuses(478.19)   . Back pain     lumbar  . Syncope      Patient Active Problem List   Diagnosis Date Noted  . Back ache 03/31/2016  . H/O total knee replacement 02/14/2016  . S/P total knee arthroplasty 01/30/2016  . Systolic ejection murmur 123456  . Anxiety, generalized 12/19/2015  . Vitamin D deficiency 11/29/2015  . Multiple food allergies 11/29/2015  . Headache 10/28/2015  . Leg cramps 10/24/2015  . Knee pain, bilateral 08/29/2015  . Sleep apnea 05/30/2015  . Adenomatous colon polyp 05/09/2015  . HLD (hyperlipidemia) 05/09/2015  . Restless leg 05/09/2015  . Allergic rhinitis 03/28/2015  . Absolute anemia 03/28/2015  . Anxiety and depression 03/28/2015  . Arthritis 03/28/2015  . Cervical pain 03/28/2015  . Chronic headache 03/28/2015  . History of colonic polyps 03/28/2015  . H/O: osteoarthritis 03/28/2015  . Clinical depression 03/28/2015  . Diverticulosis of colon 03/28/2015  . DDD (degenerative disc disease), lumbar 03/28/2015  . Acid reflux 03/28/2015  . 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 Essential hypertension 03/28/2015  .  Adaptive colitis 03/28/2015  . Narrowing of intervertebral disc space 03/28/2015  . Nasal septal perforation 03/28/2015  . Recurrent sinus infections 03/28/2015  . Spinal stenosis 03/28/2015  . Gastric ulcer 03/28/2015  . Cerebral artery occlusion with cerebral infarction ([place]) 03/28/2015  . Aortic  atherosclerosis ([place]) 07/13/2014  . H/O adenomatous polyp of colon 06/27/2005    Past Surgical History  Procedure Laterality Date  . Carpal tunnel release    . Tonsillectomy    . Abdominal hysterectomy    . Temporal artery biopsy / ligation    . Adenoidectomy    . R/o lymph nodes; right axilla    . Arthroscopic knee surgery    . Cardiac catheterization      [facility]  . Appendectomy    . Cholecystectomy    . Tubal ligation    . Rotator cuff repair Right   . Cataract extraction w/ intraocular lens implant & anterior vitrectomy, bilateral    . Knee arthroplasty Right 01/30/2016    Procedure: COMPUTER ASSISTED TOTAL KNEE ARTHROPLASTY;  Surgeon: Dereck Leep, MD;  Location: [facility] ORS;  Service: Orthopedics;  Laterality: Right;  . Spg block  03/20/2016    sphenopalatine ganglion and maxillary division of trigeminal nerve block block Dr. Manuella Ghazi    Her family history includes Colon polyps in her mother; Dementia in her father and mother; Depression in her mother; Heart disease in her brother, brother, father, and mother; Hypertension in her brother and brother.    Previous Medications   ACETAMINOPHEN (TYLENOL) 500 MG TABLET    Take 500 mg by mouth 3 (three) times daily.    ALENDRONATE (FOSAMAX) 70 MG TABLET    TAKE ONE TABLET EVERY WEEK AS DIRECTED   ATORVASTATIN (LIPITOR) 20 MG TABLET    Take 1 tablet (20 mg total) by mouth daily.   AZELASTINE (ASTELIN) 0.1 % NASAL SPRAY    Place 1 spray into both nostrils as needed for rhinitis. Use in each nostril as directed   CALCIUM CARBONATE (TUMS - DOSED IN MG ELEMENTAL CALCIUM) 500 MG CHEWABLE TABLET    Chew 1 tablet by mouth as needed.    CALCIUM CARBONATE-VITAMIN D 600-400 MG-UNIT TABLET    Take 1 tablet by mouth 2 (two) times daily.   CHOLECALCIFEROL (VITAMIN D) 1000 UNITS TABLET    Take 1 tablet (1,000 Units total) by mouth daily.   CITALOPRAM (CELEXA) 10 MG TABLET    Take 20 mg by mouth daily. Reported on 01/21/2016   DIPHENHYDRAMINE (BENADRYL) 25 MG  CAPSULE    Take 25 mg by mouth at bedtime.    DOCUSATE SODIUM (COLACE) 100 MG CAPSULE    Take 100 mg by mouth as needed for mild constipation.   FEXOFENADINE (ALLEGRA) 180 MG TABLET    Take 180 mg by mouth daily.   FLUTICASONE (FLONASE) 50 MCG/ACT NASAL SPRAY    Place 2 sprays into both nostrils daily.   GABAPENTIN (NEURONTIN) 300 MG CAPSULE    1 capsule q morning, and 2 capsules qhs   HYDROCHLOROTHIAZIDE (HYDRODIURIL) 12.5 MG TABLET    TAKE ONE (1) TABLET EACH DAY   LISINOPRIL (PRINIVIL,ZESTRIL) 5 MG TABLET    TAKE ONE (1) TABLET EACH DAY   LOPERAMIDE (IMODIUM) 2 MG CAPSULE    Take 2 mg by mouth as needed for diarrhea or loose stools.   MAGNESIUM OXIDE, ANTACID, 500 MG CAPS    Take 1 tablet by mouth daily with supper.   MELATONIN 1 MG TABS    Take  1 tablet by mouth at bedtime.   OMEGA-3 FATTY ACIDS (FISH OIL) 1200 MG CAPS    Take 2 capsules by mouth daily.    OMEPRAZOLE (PRILOSEC) 20 MG CAPSULE    Take 1 capsule (20 mg total) by mouth daily.   PHENAZOPYRIDINE HCL (AZO TABS PO)    Take 1 tablet by mouth as needed.   POTASSIUM GLUCONATE PO    Take 1 tablet by mouth daily with supper.   SIMETHICONE (GAS RELIEF PO)    Take by mouth as needed.    SODIUM CHLORIDE (OCEAN) 0.65 % NASAL SPRAY    Reported on 01/30/2016   TRAMADOL (ULTRAM) 50 MG TABLET    Take 1 tablet (50 mg total) by mouth 2 (two) times daily as needed.    Patient Care Team: Margarita Rana, MD as PCP - General (Family Medicine)     Objective:   Vitals: BP 120/66 mmHg  Pulse 80  Temp(Src) 98.4 F (36.9 C) (Oral)  Resp 16  Ht 4' 11.5" (1.511 m)  Wt 183 lb (83.008 kg)  BMI 36.36 kg/m2  Physical Exam  Constitutional: She is oriented to person, place, and time. She appears well-developed and well-nourished.  HENT:  Head: Normocephalic and atraumatic.  Right Ear: Tympanic membrane, external ear and ear canal normal.  Left Ear: Tympanic membrane, external ear and ear canal normal.  Nose: Nose normal.  Mouth/Throat: Uvula is  midline, oropharynx is clear and moist and mucous membranes are normal.  Eyes: Conjunctivae, EOM and lids are normal. Pupils are equal, round, and reactive to light.  Neck: Trachea normal and normal range of motion. Neck supple. Carotid bruit is not present. No thyroid mass and no thyromegaly present.  Cardiovascular: Normal rate, regular rhythm and normal heart sounds.   Pulmonary/Chest: Effort normal and breath sounds normal.  Abdominal: Soft. Normal appearance and bowel sounds are normal. There is no hepatosplenomegaly. There is no tenderness.  Musculoskeletal: Normal range of motion. She exhibits edema.  Lymphadenopathy:    She has no cervical adenopathy.    She has no axillary adenopathy.  Neurological: She is alert and oriented to person, place, and time. She has normal strength. No cranial nerve deficit.  Skin: Skin is warm, dry and intact.  Psychiatric: She has a normal mood and affect. Her speech is normal and behavior is normal. Judgment and thought content normal. Cognition and memory are normal.    Activities of Daily Living In your present state of health, do you have any difficulty performing the following activities: 03/31/2016 01/30/2016  Hearing? N -  Vision? N -  Difficulty concentrating or making decisions? Y -  Walking or climbing stairs? N -  Dressing or bathing? N -  Doing errands, shopping? [place]    Fall Risk Assessment Fall Risk  03/31/2016 11/29/2015 05/30/2015  Falls in the past year? Yes Yes No  Number falls in past yr: 2 or more 1 -  Injury with Fall? No No -     Depression Screen PHQ 2/9 Scores 03/31/2016 11/29/2015 05/30/2015  PHQ - 2 Score 1 3 -  PHQ- 9 Score - 15 -  Exception Documentation - - Other- indicate reason in comment box  Not completed - - Pt recently lost husband    Cognitive Testing - 6-CIT  Correct? Score   What year is it? yes 0 0 or 4  What month is it? yes 0 0 or 3  Memorize:    Pia Mau,  44,  [address],  [place],      What time  is it? (within 1 hour) yes 0 0 or 3  Count backwards from 20 yes 0 0, 2, or 4  Name the months of the year yes 0 0, 2, or 4  Repeat name & address above yes 0 0, 2, 4, 6, 8, or 10       TOTAL SCORE  0/28   Interpretation:  Normal  Normal (0-7) Abnormal (8-28)       Assessment & Plan:     Annual Wellness Visit  Reviewed patient's Family Medical History Reviewed and updated list of patient's medical providers Assessment of cognitive impairment was done Assessed patient's functional ability Established a written schedule for health screening [place] Completed and Reviewed  Exercise Activities and Dietary recommendations Goals    None      Immunization History  Administered Date(s) Administered  . Influenza, High Dose Seasonal PF 08/29/2015  . Pneumococcal Conjugate-13 08/29/2015      1. Medicare annual wellness visit, subsequent Stable. Patient advised to continue eating healthy and exercise daily.  2. Encounter for screening mammogram for breast cancer - MM DIGITAL SCREENING BILATERAL; Future  3. Ankle sprain, left, initial encounter New problem. Patient given handout on exercises for ankle.  4. Arthritis Stable. Patient advised to continue current medication. - traMADol (ULTRAM) 50 MG tablet; Take 1 tablet (50 mg total) by mouth 2 (two) times daily as needed.  Dispense: 60 tablet; Refill: 5  5. Anxiety and depression - TSH  6. HLD (hyperlipidemia) - Lipid Panel With LDL/HDL Ratio - atorvastatin (LIPITOR) 20 MG tablet; Take 1 tablet (20 mg total) by mouth daily at 6 PM.  Dispense: 90 tablet; Refill: 3  7. Gastroesophageal reflux disease, esophagitis presence not specified - omeprazole (PRILOSEC) 20 MG capsule; Take 1 capsule (20 mg total) by mouth daily.  Dispense: 90 capsule; Refill: 3     Patient seen and examined by Dr. Jerrell Belfast, and note scribed by Philbert Riser. Dimas, CMA.  I have reviewed the document for accuracy and  completeness and I agree with above. Jerrell Belfast, MD   Margarita Rana, MD   ------------------------------------------------------------------------------------------------------------

## 2016-04-07 DIAGNOSIS — E785 Hyperlipidemia, unspecified: Secondary | ICD-10-CM | POA: Diagnosis not present

## 2016-04-07 DIAGNOSIS — F418 Other specified anxiety disorders: Secondary | ICD-10-CM | POA: Diagnosis not present

## 2016-04-07 DIAGNOSIS — F331 Major depressive disorder, recurrent, moderate: Secondary | ICD-10-CM | POA: Diagnosis not present

## 2016-04-07 DIAGNOSIS — F411 Generalized anxiety disorder: Secondary | ICD-10-CM | POA: Diagnosis not present

## 2016-04-08 ENCOUNTER — Telehealth: Payer: Self-pay

## 2016-04-08 LAB — LIPID PANEL WITH LDL/HDL RATIO
CHOLESTEROL TOTAL: 161 mg/dL (ref 100–199)
HDL: 40 mg/dL (ref >39)
LDL Calculated: 87 mg/dL (ref 0–99)
LDl/HDL Ratio: 2.2 ratio units (ref 0.0–3.2)
TRIGLYCERIDES: 169 mg/dL — AB (ref 0–149)
VLDL CHOLESTEROL CAL: 34 mg/dL (ref 5–40)

## 2016-04-08 LAB — TSH: TSH: 3.75 u[IU]/mL (ref 0.450–4.500)

## 2016-04-08 NOTE — Telephone Encounter (Signed)
-----   Message from Margarita Rana, MD sent at 04/08/2016  7:11 AM EDT ----- Labs stable. Please notify patient. Thanks.

## 2016-04-08 NOTE — Telephone Encounter (Signed)
Pt advised.   Thanks,   -Mikhail Hallenbeck  

## 2016-04-09 DIAGNOSIS — M722 Plantar fascial fibromatosis: Secondary | ICD-10-CM | POA: Diagnosis not present

## 2016-05-09 ENCOUNTER — Other Ambulatory Visit: Payer: Self-pay

## 2016-05-09 DIAGNOSIS — M1990 Unspecified osteoarthritis, unspecified site: Secondary | ICD-10-CM

## 2016-05-09 DIAGNOSIS — M810 Age-related osteoporosis without current pathological fracture: Secondary | ICD-10-CM

## 2016-05-09 DIAGNOSIS — I10 Essential (primary) hypertension: Secondary | ICD-10-CM

## 2016-05-09 MED ORDER — ALENDRONATE SODIUM 70 MG PO TABS: ORAL_TABLET | ORAL | Status: DC

## 2016-05-09 MED ORDER — LISINOPRIL 5 MG PO TABS: ORAL_TABLET | ORAL | Status: DC

## 2016-05-09 MED ORDER — HYDROCHLOROTHIAZIDE 12.5 MG PO TABS: ORAL_TABLET | ORAL | Status: DC

## 2016-05-09 MED ORDER — TRAMADOL HCL 50 MG PO TABS: 50.0000 mg | ORAL_TABLET | Freq: Two times a day (BID) | ORAL | Status: DC | PRN

## 2016-05-09 NOTE — Telephone Encounter (Signed)
Printed, please fax or call in to pharmacy. Thank you.   

## 2016-05-16 ENCOUNTER — Other Ambulatory Visit: Payer: Self-pay | Admitting: Family Medicine

## 2016-05-16 ENCOUNTER — Ambulatory Visit
Admission: RE | Admit: 2016-05-16 | Discharge: 2016-05-16 | Disposition: A | Discharge status: Home / Self Care | Payer: Medicare Other | Source: Ambulatory Visit | Attending: Family Medicine | Admitting: Family Medicine

## 2016-05-16 DIAGNOSIS — Z1231 Encounter for screening mammogram for malignant neoplasm of breast: Secondary | ICD-10-CM

## 2016-05-29 ENCOUNTER — Ambulatory Visit (INDEPENDENT_AMBULATORY_CARE_PROVIDER_SITE_OTHER): Payer: Medicare Other | Admitting: Physician Assistant

## 2016-05-29 ENCOUNTER — Encounter: Payer: Self-pay | Admitting: Physician Assistant

## 2016-05-29 VITALS — BP 116/58 | HR 62 | Temp 98.5°F | Resp 16 | Wt 172.8 lb

## 2016-05-29 DIAGNOSIS — N3946 Mixed incontinence: Secondary | ICD-10-CM | POA: Diagnosis not present

## 2016-05-29 DIAGNOSIS — I10 Essential (primary) hypertension: Secondary | ICD-10-CM

## 2016-05-29 DIAGNOSIS — R600 Localized edema: Secondary | ICD-10-CM

## 2016-05-29 DIAGNOSIS — N3289 Other specified disorders of bladder: Secondary | ICD-10-CM | POA: Diagnosis not present

## 2016-05-29 MED ORDER — PHENAZOPYRIDINE HCL 95 MG PO TABS: 95.0000 mg | ORAL_TABLET | ORAL | Status: AC | PRN

## 2016-05-29 MED ORDER — HYDROCHLOROTHIAZIDE 25 MG PO TABS: 25.0000 mg | ORAL_TABLET | Freq: Every day | ORAL | Status: DC

## 2016-05-29 NOTE — Progress Notes (Signed)
Patient: Helen Ramsey Female    DOB: [birth year]   82 y.o.   MRN: [id number] Visit Date: 05/29/2016  Today's Provider: Mar Daring, PA-C   Chief Complaint  Patient presents with  . Urinary Incontinence   Subjective:    HPI Patient here today with c/o increase urine leakage. She has had this issue for many years, reports since her hysterectomy in her 36s. She did have a bladder tact at that time and reports no issues with it. States her last pelvic with Dr. Davis Gourd was a couple of years ago and she was told she did not have a cystocele and it was holding up good. She has seen Tonye Pearson, PA-C for this issue a few years back. She has been placed on Myrbetriq but did not tolerate well. She has also been through pelvic floor therapy many years ago and reports trying to do Kegel exercises on her own still.   Of recent his has been getting worse over the last 4-6 weeks. She leaks urine with lifting, standing, walking, laughing, sneezing. Patient describes the symptoms as  urine leaking unpredictably. She states that she has had 3 episodes of complete emptying of her bladder unexpectedly.      Allergies  Allergen Reactions  . Amoxicillin   . Clindamycin/Lincomycin   . Doxycycline   . Levaquin [Levofloxacin In D5w]   . Mirabegron Nausea And Vomiting  . Nsaids Other (See Comments)    GI upset  . Oxycodone Nausea Only    Other reaction(s): Hallucination  . Phenergan [Promethazine Hcl]   . Singulair [Montelukast]   . Vicodin [Hydrocodone-Acetaminophen]   . Zocor [Simvastatin]   . Zoloft  [Sertraline] Other (See Comments)  . Ampicillin Rash  . Bactrim [Sulfamethoxazole-Trimethoprim] Rash  . Penicillins Rash    Ampicillin, Clindamycin, Doxycycline Levaquin-severe headache, muscle and joint aches, weak   Current Meds  Medication Sig  . acetaminophen (TYLENOL) 500 MG tablet Take 500 mg by mouth 3 (three) times daily.   Marland Kitchen alendronate (FOSAMAX) 70 MG tablet TAKE  ONE TABLET EVERY WEEK AS DIRECTED  . atorvastatin (LIPITOR) 20 MG tablet Take 1 tablet (20 mg total) by mouth daily at 6 PM.  . azelastine (ASTELIN) 0.1 % nasal spray Place 1 spray into both nostrils as needed for rhinitis. Use in each nostril as directed  . calcium carbonate (TUMS - DOSED IN MG ELEMENTAL CALCIUM) 500 MG chewable tablet Chew 1 tablet by mouth as needed.   . Calcium Carbonate-Vitamin D 600-400 MG-UNIT tablet Take 1 tablet by mouth 2 (two) times daily. (Patient taking differently: Take 1 tablet by mouth daily with supper. )  . cholecalciferol (VITAMIN D) 1000 units tablet Take 1 tablet (1,000 Units total) by mouth daily.  . citalopram (CELEXA) 10 MG tablet Take 40 mg by mouth daily. Reported on 01/21/2016  . diphenhydrAMINE (BENADRYL) 25 mg capsule Take 25 mg by mouth at bedtime.   . docusate sodium (COLACE) 100 MG capsule Take 100 mg by mouth as needed for mild constipation.  . fexofenadine (ALLEGRA) 180 MG tablet Take 180 mg by mouth daily.  Marland Kitchen gabapentin (NEURONTIN) 300 MG capsule 1 capsule q morning, and 2 capsules qhs  . hydrochlorothiazide (HYDRODIURIL) 12.5 MG tablet TAKE ONE (1) TABLET EACH DAY  . lisinopril (PRINIVIL,ZESTRIL) 5 MG tablet TAKE ONE (1) TABLET EACH DAY  . loperamide (IMODIUM) 2 MG capsule Take 2 mg by mouth as needed for diarrhea or loose stools.  . Magnesium Oxide,  Antacid, 500 MG CAPS Take 1 tablet by mouth daily with supper.  . Melatonin 1 MG TABS Take 1 tablet by mouth at bedtime.  . Omega-3 Fatty Acids (FISH OIL) 1200 MG CAPS Take 2 capsules by mouth daily.   Marland Kitchen omeprazole (PRILOSEC) 20 MG capsule Take 1 capsule (20 mg total) by mouth daily.  . Phenazopyridine HCl (AZO TABS PO) Take 1 tablet by mouth as needed.  Marland Kitchen POTASSIUM GLUCONATE PO Take 1 tablet by mouth daily with supper.  . Simethicone (GAS RELIEF PO) Take by mouth as needed.   . sodium chloride (OCEAN) 0.65 % nasal spray Reported on 01/30/2016  . traMADol (ULTRAM) 50 MG tablet Take 1 tablet (50 mg  total) by mouth 2 (two) times daily as needed.    Review of Systems  Constitutional: Negative.   Respiratory: Negative.   Cardiovascular: Positive for leg swelling. Negative for chest pain and palpitations.  Gastrointestinal: Positive for diarrhea (Zpak Tuesday before the dental appointment). Negative for abdominal pain.  Genitourinary: Positive for urgency, frequency and enuresis. Negative for dysuria, hematuria, flank pain, decreased urine volume, vaginal bleeding, vaginal discharge, vaginal pain and pelvic pain.  Musculoskeletal: Negative for back pain.    Social History  Substance Use Topics  . Smoking status: Never Smoker   . Smokeless tobacco: Never Used  . Alcohol Use: No   Objective:   BP 116/58 mmHg  Pulse 62  Temp(Src) 98.5 F (36.9 C) (Oral)  Resp 16  Wt 172 lb 12.8 oz (78.382 kg)  Physical Exam  Constitutional: She is oriented to person, place, and time. She appears well-developed and well-nourished. No distress.  Cardiovascular: Normal rate, regular rhythm and normal heart sounds.  Exam reveals no gallop and no friction rub.   No murmur heard. Pulmonary/Chest: Effort normal and breath sounds normal. No respiratory distress. She has no wheezes. She has no rales.  Abdominal: Soft. Normal appearance and bowel sounds are normal. She exhibits no distension and no mass. There is no hepatosplenomegaly. There is no tenderness. There is no rebound, no guarding and no CVA tenderness.  Neurological: She is alert and oriented to person, place, and time.  Skin: Skin is warm and dry. She is not diaphoretic.      Assessment & Plan:     1. Mixed incontinence Will refer back to Tonye Pearson, PA-C at [facility] for further evaluation and treatment. Discussed timed toileting to try in the meantime. - Ambulatory referral to Urology  2. Bladder spasm Stable. Diagnosis pulled for medication refill. Continue current medical treatment plan. See above medical treatment  plan. - phenazopyridine (AZO-TABS) 95 MG tablet; Take 1 tablet (95 mg total) by mouth as needed.  Dispense: 30 tablet; Refill: 3 - Ambulatory referral to Urology  3. Bilateral edema of lower extremity Increased HCTZ to 25mg  daily. Will recheck labs when she returns in late August. - hydrochlorothiazide (HYDRODIURIL) 25 MG tablet; Take 1 tablet (25 mg total) by mouth daily.  Dispense: 90 tablet; Refill: 3  4. Essential hypertension Will discontinue Lisinopril 5mg  due to hypotension. She is to check her BP at home. If BP increases and starts running 140s/90s consistently will start lisinopril back.       Mar Daring, PA-C  [facility]

## 2016-05-29 NOTE — Patient Instructions (Signed)
Urinary Incontinence Urinary incontinence is the involuntary loss of urine from your bladder. CAUSES  There are many causes of urinary incontinence. They include:  Medicines.  Infections.  Prostatic enlargement, leading to overflow of urine from your bladder.  Surgery.  Neurological diseases.  Emotional factors. SIGNS AND SYMPTOMS Urinary Incontinence can be divided into four types: 1. Urge incontinence. Urge incontinence is the involuntary loss of urine before you have the opportunity to go to the bathroom. There is a sudden urge to void but not enough time to reach a bathroom. 2. Stress incontinence. Stress incontinence is the sudden loss of urine with any activity that forces urine to pass. It is commonly caused by anatomical changes to the pelvis and sphincter areas of your body. 3. Overflow incontinence. Overflow incontinence is the loss of urine from an obstructed opening to your bladder. This results in a backup of urine and a resultant buildup of pressure within the bladder. When the pressure within the bladder exceeds the closing pressure of the sphincter, the urine overflows, which causes incontinence, similar to water overflowing a dam. 4. Total incontinence. Total incontinence is the loss of urine as a result of the inability to store urine within your bladder. DIAGNOSIS  Evaluating the cause of incontinence may require:  A thorough and complete medical and obstetric history.  A complete physical exam.  Laboratory tests such as a urine culture and sensitivities. When additional tests are indicated, they can include:  An ultrasound exam.  Kidney and bladder X-rays.  Cystoscopy. This is an exam of the bladder using a narrow scope.  Urodynamic testing to test the nerve function to the bladder and sphincter areas. TREATMENT  Treatment for urinary incontinence depends on the cause:  For urge incontinence caused by a bacterial infection, antibiotics will be prescribed.  If the urge incontinence is related to medicines you take, your health care provider may have you change the medicine.  For stress incontinence, surgery to re-establish anatomical support to the bladder or sphincter, or both, will often correct the condition.  For overflow incontinence caused by an enlarged prostate, an operation to open the channel through the enlarged prostate will allow the flow of urine out of the bladder. In women with fibroids, a hysterectomy may be recommended.  For total incontinence, surgery on your urinary sphincter may help. An artificial urinary sphincter (an inflatable cuff placed around the urethra) may be required. In women who have developed a hole-like passage between their bladder and vagina (vesicovaginal fistula), surgery to close the fistula often is required. HOME CARE INSTRUCTIONS  Normal daily hygiene and the use of pads or adult diapers that are changed regularly will help prevent odors and skin damage.  Avoid caffeine. It can overstimulate your bladder.  Use the bathroom regularly. Try about every 2-3 hours to go to the bathroom, even if you do not feel the need to do so. Take time to empty your bladder completely. After urinating, wait a minute. Then try to urinate again.  For causes involving nerve dysfunction, keep a log of the medicines you take and a journal of the times you go to the bathroom. SEEK MEDICAL CARE IF:  You experience worsening of pain instead of improvement in pain after your procedure.  Your incontinence becomes worse instead of better. SEE IMMEDIATE MEDICAL CARE IF:  You experience fever or shaking chills.  You are unable to pass your urine.  You have redness spreading into your groin or down into your thighs. MAKE SURE   YOU:   Understand these instructions.   Will watch your condition.  Will get help right away if you are not doing well or get worse.   This information is not intended to replace advice given to you  by your health care provider. Make sure you discuss any questions you have with your health care provider.   Document Released: 12/11/2004 Document Revised: 11/24/2014 Document Reviewed: 04/12/2013 Elsevier Interactive Patient Education 2016 [employer]  

## 2016-06-10 DIAGNOSIS — M5481 Occipital neuralgia: Secondary | ICD-10-CM | POA: Diagnosis not present

## 2016-06-18 ENCOUNTER — Encounter: Payer: Self-pay | Admitting: Urology

## 2016-06-18 ENCOUNTER — Ambulatory Visit (INDEPENDENT_AMBULATORY_CARE_PROVIDER_SITE_OTHER): Payer: Medicare Other | Admitting: Urology

## 2016-06-18 VITALS — BP 114/55 | HR 59 | Ht 59.5 in | Wt 168.0 lb

## 2016-06-18 DIAGNOSIS — N3946 Mixed incontinence: Secondary | ICD-10-CM

## 2016-06-18 NOTE — Progress Notes (Signed)
06/18/2016 11:25 AM   Helen Ramsey 1933/09/18 [id number]  Referring provider: Margarita Rana, MD [address] [place], [place] [address]  Chief Complaint  Patient presents with  . Follow-up    incontinece    HPI: The patient for years has urinary incontinence. She time voids every 2-3 hours but she really does not complain of a lot of urge incontinence. 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 She might have some enuresis but sometimes her history was a little bit difficult to sort out. She leaks a small amount when she coughs and sneezes and benzoin list. She wears 1-2 heavy pads a day and sometimes or less wet and other times so. When she has an episode  She voids every every 3 hours with time voiding and gets up twice a night and reports a good flow  She describes a hysterectomy and bladder suspension and on commonly gets urinary tract infections. She's been having mini strokes by history.  She denies a history of kidney stones and tends toward constipation. He failed Mirabella trait with side effects  Modifying factors: There are no other modifying factors  Associated signs and symptoms: There are no other associated signs and symptoms Aggravating and relieving factors: There are no other aggravating or relieving factors Severity: Moderate Duration: Persistent     PMH: Past Medical History:  Diagnosis Date  . Allergic rhinitis, cause unspecified   . Allergic rhinitis, cause unspecified   . Atherosclerosis of aorta ([place])   . Back pain    lumbar  . Degeneration of lumbar or lumbosacral intervertebral disc   . Diverticulitis of colon (without mention of hemorrhage)   . Dizziness and giddiness   . Dysthymic disorder   . Headache(784.0)   . Myalgia and myositis, unspecified   . Osteoporosis, unspecified   . Other and unspecified hyperlipidemia   . Other diseases of nasal cavity and sinuses(478.19)   . Other specified  disorders of bladder   . Syncope   . Trigger finger (acquired)   . Unspecified essential hypertension   . Unspecified sleep apnea   . Urinary frequency     Surgical History: Past Surgical History:  Procedure Laterality Date  . ABDOMINAL HYSTERECTOMY    . ADENOIDECTOMY    . APPENDECTOMY    . ARTHROSCOPIC KNEE SURGERY    . CARDIAC CATHETERIZATION     [facility]  . CARPAL TUNNEL RELEASE    . CATARACT EXTRACTION W/ INTRAOCULAR LENS IMPLANT & ANTERIOR VITRECTOMY, BILATERAL    . CHOLECYSTECTOMY    . KNEE ARTHROPLASTY Right 01/30/2016   Procedure: COMPUTER ASSISTED TOTAL KNEE ARTHROPLASTY;  Surgeon: Dereck Leep, MD;  Location: [facility] ORS;  Service: Orthopedics;  Laterality: Right;  . R/O Lymph Nodes; Right Axilla    . ROTATOR CUFF REPAIR Right   . SPG block  03/20/2016   sphenopalatine ganglion and maxillary division of trigeminal nerve block block Dr. Manuella Ghazi  . TEMPORAL ARTERY BIOPSY / LIGATION    . TONSILLECTOMY    . TUBAL LIGATION      Home Medications:    Medication List       Accurate as of 06/18/16 11:25 AM. Always use your most recent med list.          acetaminophen 500 MG tablet Commonly known as:  TYLENOL Take 500 mg by mouth 3 (three) times daily.   alendronate 70 MG tablet Commonly known as:  FOSAMAX TAKE ONE TABLET EVERY  WEEK AS DIRECTED   atorvastatin 20 MG tablet Commonly known as:  LIPITOR Take 1 tablet (20 mg total) by mouth daily at 6 PM.   azelastine 0.1 % nasal spray Commonly known as:  ASTELIN Place 1 spray into both nostrils as needed for rhinitis. Use in each nostril as directed   BENADRYL 25 mg capsule Generic drug:  diphenhydrAMINE Take 25 mg by mouth at bedtime.   calcium carbonate 500 MG chewable tablet Commonly known as:  TUMS - dosed in mg elemental calcium Chew 1 tablet by mouth as needed.   Calcium Carbonate-Vitamin D 600-400 MG-UNIT tablet Take 1 tablet by mouth 2 (two) times daily.   cholecalciferol 1000 units tablet Commonly known  as:  VITAMIN D Take 1 tablet (1,000 Units total) by mouth daily.   citalopram 10 MG tablet Commonly known as:  CELEXA Take 40 mg by mouth daily. Reported on 01/21/2016   docusate sodium 100 MG capsule Commonly known as:  COLACE Take 100 mg by mouth as needed for mild constipation.   fexofenadine 180 MG tablet Commonly known as:  ALLEGRA Take 180 mg by mouth daily.   Fish Oil 1200 MG Caps Take 2 capsules by mouth daily.   fluticasone 50 MCG/ACT nasal spray Commonly known as:  FLONASE Place 2 sprays into both nostrils daily.   gabapentin 300 MG capsule Commonly known as:  NEURONTIN 1 capsule q morning, and 2 capsules qhs   GAS RELIEF PO Take by mouth as needed.   hydrochlorothiazide 25 MG tablet Commonly known as:  HYDRODIURIL Take 1 tablet (25 mg total) by mouth daily.   loperamide 2 MG capsule Commonly known as:  IMODIUM Take 2 mg by mouth as needed for diarrhea or loose stools.   Magnesium Oxide (Antacid) 500 MG Caps Take 1 tablet by mouth daily with supper.   Melatonin 1 MG Tabs Take 1 tablet by mouth at bedtime.   omeprazole 20 MG capsule Commonly known as:  PRILOSEC Take 1 capsule (20 mg total) by mouth daily.   phenazopyridine 95 MG tablet Commonly known as:  AZO-TABS Take 1 tablet (95 mg total) by mouth as needed.   POTASSIUM GLUCONATE PO Take 1 tablet by mouth daily with supper.   sodium chloride 0.65 % nasal spray Commonly known as:  OCEAN Reported on 01/30/2016   traMADol 50 MG tablet Commonly known as:  ULTRAM Take 1 tablet (50 mg total) by mouth 2 (two) times daily as needed.       Allergies:  Allergies  Allergen Reactions  . Amoxicillin   . Clindamycin/Lincomycin   . Doxycycline   . Levaquin [Levofloxacin In D5w]   . Mirabegron Nausea And Vomiting  . Nsaids Other (See Comments)    GI upset  . Oxycodone Nausea Only    Other reaction(s): Hallucination  . Phenergan [Promethazine Hcl]   . Singulair [Montelukast]   . Vicodin  [Hydrocodone-Acetaminophen]   . Zocor [Simvastatin]   . Zoloft  [Sertraline] Other (See Comments)  . Ampicillin Rash  . Bactrim [Sulfamethoxazole-Trimethoprim] Rash  . Penicillins Rash    Ampicillin, Clindamycin, Doxycycline Levaquin-severe headache, muscle and joint aches, weak    Family History: Family History  Problem Relation Age of Onset  . Heart disease Mother   . Depression Mother   . Colon polyps Mother   . Dementia Mother   . Heart disease Father   . Dementia Father   . Heart disease Brother   . Hypertension Brother   . Heart disease Brother   .  Hypertension Brother   . Kidney cancer Neg Hx   . Bladder Cancer Neg Hx     Social History:  reports that she has never smoked. She has never used smokeless tobacco. She reports that she does not drink alcohol or use drugs.  ROS: UROLOGY Frequent Urination?: Yes Hard to postpone urination?: Yes Burning/pain with urination?: No Get up at night to urinate?: Yes Leakage of urine?: Yes Urine stream starts and stops?: Yes Trouble starting stream?: No Do you have to strain to urinate?: No Blood in urine?: No Urinary tract infection?: No Sexually transmitted disease?: No Injury to kidneys or bladder?: No Painful intercourse?: No Weak stream?: No Currently pregnant?: No Vaginal bleeding?: No Last menstrual period?: n  Gastrointestinal Nausea?: No Vomiting?: No Indigestion/heartburn?: No Diarrhea?: No Constipation?: Yes  Constitutional Fever: No Night sweats?: No Weight loss?: No Fatigue?: Yes  Skin Skin rash/lesions?: No Itching?: Yes  Eyes Blurred vision?: No Double vision?: No  Ears/Nose/Throat Sore throat?: No Sinus problems?: Yes  Hematologic/Lymphatic Swollen glands?: No Easy bruising?: Yes  Cardiovascular Leg swelling?: Yes Chest pain?: No  Respiratory Cough?: No Shortness of breath?: Yes  Endocrine Excessive thirst?: No  Musculoskeletal Back pain?: Yes Joint pain?:  Yes  Neurological Headaches?: Yes Dizziness?: Yes  Psychologic Depression?: Yes Anxiety?: Yes  Physical Exam: BP (!) 114/55   Pulse (!) 59   Ht 4' 11.5" (1.511 m)   Wt 168 lb (76.2 kg)   BMI 33.36 kg/m   Constitutional:  Alert and oriented, No acute distress. HEENT: [place] AT, moist mucus membranes.  Trachea midline, no masses. Cardiovascular: No clubbing, cyanosis, or edema. Respiratory: Normal respiratory effort, no increased work of breathing. GI: Abdomen is soft, nontender, nondistended, no abdominal masses GU: No CVA tenderness. Well supported bladder neck with no stress incontinence. High grade 1 cystocele and grade 1 rectocele Skin: No rashes, bruises or suspicious lesions. Lymph: No cervical or inguinal adenopathy. Neurologic: Grossly intact, no focal deficits, moving all 4 extremities. Psychiatric: Normal mood and affect.  Laboratory Data: Lab Results  Component Value Date   WBC 5.7 02/18/2016   HGB 12.6 02/18/2016   HCT 37.3 02/18/2016   MCV 86.6 02/18/2016   PLT 289 02/18/2016    Lab Results  Component Value Date   CREATININE 0.61 02/18/2016    No results found for: PSA  No results found for: TESTOSTERONE  No results found for: HGBA1C  Urinalysis    Component Value Date/Time   COLORURINE STRAW (A) 01/16/2016 1337   APPEARANCEUR CLEAR (A) 01/16/2016 1337   LABSPEC 1.008 01/16/2016 1337   PHURINE 6.0 01/16/2016 1337   GLUCOSEU NEGATIVE 01/16/2016 1337   HGBUR NEGATIVE 01/16/2016 1337   BILIRUBINUR NEGATIVE 01/16/2016 [place] 01/16/2016 1337   PROTEINUR NEGATIVE 01/16/2016 1337   NITRITE NEGATIVE 01/16/2016 1337   LEUKOCYTESUR NEGATIVE 01/16/2016 1337    Pertinent Imaging: None none  Assessment & Plan:  Clinically the patient has mixed incontinence and likely high-volume urge incontinence with foot on the floor syndrome. She has risk factors for neurogenic bladder. The role of urodynamics and cystoscopy described   I decided  to see the patient in 4 weeks for cystoscopy and I gave her 5 weeks of Toviaz 8 mg  There are no diagnoses linked to this encounter.  No Follow-up on file.  Reece Packer, MD  [facility] [address] [place], [place] [address] [phone]

## 2016-07-11 DIAGNOSIS — Z86010 Personal history of colonic polyps: Secondary | ICD-10-CM | POA: Diagnosis not present

## 2016-07-14 DIAGNOSIS — R51 Headache: Secondary | ICD-10-CM | POA: Diagnosis not present

## 2016-07-14 DIAGNOSIS — M5481 Occipital neuralgia: Secondary | ICD-10-CM | POA: Diagnosis not present

## 2016-07-14 DIAGNOSIS — F331 Major depressive disorder, recurrent, moderate: Secondary | ICD-10-CM | POA: Diagnosis not present

## 2016-07-14 DIAGNOSIS — F411 Generalized anxiety disorder: Secondary | ICD-10-CM | POA: Diagnosis not present

## 2016-07-15 ENCOUNTER — Ambulatory Visit (INDEPENDENT_AMBULATORY_CARE_PROVIDER_SITE_OTHER): Payer: Medicare Other | Admitting: Urology

## 2016-07-15 VITALS — BP 157/64 | HR 75 | Ht 59.0 in | Wt 170.0 lb

## 2016-07-15 DIAGNOSIS — Z961 Presence of intraocular lens: Secondary | ICD-10-CM | POA: Diagnosis not present

## 2016-07-15 DIAGNOSIS — N3946 Mixed incontinence: Secondary | ICD-10-CM | POA: Diagnosis not present

## 2016-07-15 MED ORDER — LIDOCAINE HCL 2 % EX GEL
1.0000 "application " | Freq: Once | CUTANEOUS | Status: AC
  Administered 2016-07-15: 1 via URETHRAL

## 2016-07-15 MED ORDER — CIPROFLOXACIN HCL 500 MG PO TABS
500.0000 mg | ORAL_TABLET | Freq: Once | ORAL | Status: AC
  Administered 2016-07-15: 500 mg via ORAL

## 2016-07-15 NOTE — Progress Notes (Signed)
   07/15/16  CC: Mixed incontinence  HPI: 82 year old female with mixed urinary incontinence who presents today per Dr. McDiarmid for cystoscopy.  She is currently on Toviaz which is improved her symptoms but is having some issues with dry mouth and worsening confusion and dizziness.  Cystoscopy Procedure Note  Patient identification was confirmed, informed consent was obtained, and patient was prepped using Betadine solution.  Lidocaine jelly was administered per urethral meatus.    Preoperative abx where received prior to procedure.    Procedure: - Flexible cystoscope introduced, without any difficulty.   - Thorough search of the bladder revealed:    normal urethral meatus    normal urothelium    no stones    no ulcers     no tumors    no urethral polyps    no trabeculation  - Ureteral orifices were normal in position and appearance.  Post-Procedure: - Patient tolerated the procedure well   Assessment/plan:  #1. Mixed incontinence- advised the patient to stop Toviaz and see how her urinary symptoms to as well as note any improvement in dizziness or confusion. We'll have her follow up with Dr. McDiarmid in 1 month.  No bladder pathology identified on cystoscopy today.

## 2016-07-16 LAB — URINALYSIS, COMPLETE
BILIRUBIN UA: NEGATIVE
GLUCOSE, UA: NEGATIVE
KETONES UA: NEGATIVE
Nitrite, UA: NEGATIVE
PROTEIN UA: NEGATIVE
RBC UA: NEGATIVE
SPEC GRAV UA: 1.015 (ref 1.005–1.030)
Urobilinogen, Ur: 0.2 mg/dL (ref 0.2–1.0)
pH, UA: 7 (ref 5.0–7.5)

## 2016-07-16 LAB — MICROSCOPIC EXAMINATION
BACTERIA UA: NONE SEEN
RBC MICROSCOPIC, UA: NONE SEEN /HPF (ref 0–?)

## 2016-07-18 ENCOUNTER — Other Ambulatory Visit: Payer: Medicare Other | Admitting: Urology

## 2016-07-23 ENCOUNTER — Encounter: Payer: Self-pay | Admitting: Family Medicine

## 2016-07-23 ENCOUNTER — Ambulatory Visit (INDEPENDENT_AMBULATORY_CARE_PROVIDER_SITE_OTHER): Payer: Medicare Other | Admitting: Family Medicine

## 2016-07-23 VITALS — BP 110/56 | HR 64 | Temp 98.0°F | Resp 15 | Wt 170.0 lb

## 2016-07-23 DIAGNOSIS — R42 Dizziness and giddiness: Secondary | ICD-10-CM | POA: Diagnosis not present

## 2016-07-23 DIAGNOSIS — G44209 Tension-type headache, unspecified, not intractable: Secondary | ICD-10-CM

## 2016-07-23 NOTE — Progress Notes (Signed)
Subjective:     Patient ID: Helen Ramsey, female   DOB: 01-11-1933, 82 y.o.   MRN: [id number]  HPI  Chief Complaint  Patient presents with  . Dizziness    Patient comes into office today accompanied by her daughter with concerns of dizziness and headache overr a period of 7 years. Daughter reports over the past several months symptoms of dizziness have have increased. Daughter reports Friday 9/1 Patient fell wasll walking with walker, paitent states that she lost her balance due to dizziness. Patient reports seeing ENT who stated that patient did not suffer from vertigo.   States she was turning, lost balance and landed on her buttocks without injury. Has had two medication changes recently which may be contributing: HCTZ increased at last o.v. 7/13 and Toviaz added.She has discontinued the [place] and feels her vision is no longer blurred. She reports she is primarily dizzy when up. She also followed by neurology, Dr. Manuella Ghazi, for occipital neuralgia and is pending f/u in October. Has received occipital nerve blocks in the past.   Review of Systems     Objective:   Physical Exam  Constitutional: She appears well-developed and well-nourished. No distress (using seated walker to ambulate).  Eyes: EOM are normal.  Pupils equal and aphakic with diminished reactivity  Neck: Carotid bruit is not present.  Cardiovascular: Normal rate and regular rhythm.   Pulmonary/Chest: Breath sounds normal.  Musculoskeletal: She exhibits no edema (of lower extremties).  Neurological: Coordination (finger to nose and Romberg WNL) normal.       Assessment:    1. Dizziness: will decrease medication esp. Diuretic and gabapentin. - Comprehensive metabolic panel - CBC with Differential/Platelet  2. Tension headache: per neurology    Plan:    Further f/u pending labs (call daughter, Tye [place], [phone]). She has appointment with Joette Catching next week.

## 2016-07-23 NOTE — Patient Instructions (Addendum)
Decrease HCTZ to 1/2 pill daily. We will call you with the lab results. Try to decrease gabapentin to one pill twice daily. Follow up with Dr. Manuella Ghazi as scheduled.

## 2016-07-24 LAB — COMPREHENSIVE METABOLIC PANEL
ALBUMIN: 4.2 g/dL (ref 3.5–4.7)
ALK PHOS: 53 IU/L (ref 39–117)
ALT: 21 IU/L (ref 0–32)
AST: 21 IU/L (ref 0–40)
Albumin/Globulin Ratio: 1.8 (ref 1.2–2.2)
BUN / CREAT RATIO: 24 (ref 12–28)
BUN: 17 mg/dL (ref 8–27)
Bilirubin Total: 0.4 mg/dL (ref 0.0–1.2)
CO2: 25 mmol/L (ref 18–29)
CREATININE: 0.71 mg/dL (ref 0.57–1.00)
Calcium: 9.5 mg/dL (ref 8.7–10.3)
Chloride: 98 mmol/L (ref 96–106)
GFR, EST AFRICAN AMERICAN: 92 mL/min/{1.73_m2} (ref >59)
GFR, EST NON AFRICAN AMERICAN: 80 mL/min/{1.73_m2} (ref >59)
GLOBULIN, TOTAL: 2.3 g/dL (ref 1.5–4.5)
GLUCOSE: 86 mg/dL (ref 65–99)
Potassium: 4.5 mmol/L (ref 3.5–5.2)
SODIUM: 141 mmol/L (ref 134–144)
TOTAL PROTEIN: 6.5 g/dL (ref 6.0–8.5)

## 2016-07-24 LAB — CBC WITH DIFFERENTIAL/PLATELET
BASOS: 0 %
Basophils Absolute: 0 10*3/uL (ref 0.0–0.2)
EOS (ABSOLUTE): 0.2 10*3/uL (ref 0.0–0.4)
EOS: 2 %
HEMATOCRIT: 38.3 % (ref 34.0–46.6)
HEMOGLOBIN: 12.7 g/dL (ref 11.1–15.9)
Immature Grans (Abs): 0 10*3/uL (ref 0.0–0.1)
Immature Granulocytes: 1 %
LYMPHS ABS: 1.6 10*3/uL (ref 0.7–3.1)
Lymphs: 23 %
MCH: 28.8 pg (ref 26.6–33.0)
MCHC: 33.2 g/dL (ref 31.5–35.7)
MCV: 87 fL (ref 79–97)
MONOCYTES: 9 %
MONOS ABS: 0.7 10*3/uL (ref 0.1–0.9)
NEUTROS ABS: 4.6 10*3/uL (ref 1.4–7.0)
Neutrophils: 65 %
Platelets: 245 10*3/uL (ref 150–379)
RBC: 4.41 x10E6/uL (ref 3.77–5.28)
RDW: 13.9 % (ref 12.3–15.4)
WBC: 7.1 10*3/uL (ref 3.4–10.8)

## 2016-07-29 DIAGNOSIS — R42 Dizziness and giddiness: Secondary | ICD-10-CM | POA: Diagnosis not present

## 2016-07-29 DIAGNOSIS — Z96651 Presence of right artificial knee joint: Secondary | ICD-10-CM | POA: Diagnosis not present

## 2016-08-01 ENCOUNTER — Telehealth: Payer: Self-pay | Admitting: Urology

## 2016-08-01 NOTE — Telephone Encounter (Signed)
I have not seen this patient.  According to Dr. Cherrie Gauze note, she was to stop the [place] and returned to see Dr. Matilde Sprang.  He may want to pursue urodynamics with the patient.

## 2016-08-01 NOTE — Telephone Encounter (Signed)
Called to reschd appt for 08-11-16 with Macdiarmid because he is not going to be here. He will be out of the office until 09-29-16. The patient stated that she doesn't think she needs to see him because she has not been taking the medication you gave her and she has just been using pads. She wants to know if she needs to follow up with you or him? Please advise   Helen Ramsey

## 2016-08-11 ENCOUNTER — Ambulatory Visit: Payer: Medicare Other

## 2016-08-13 DIAGNOSIS — G9349 Other encephalopathy: Secondary | ICD-10-CM | POA: Diagnosis not present

## 2016-08-13 DIAGNOSIS — R42 Dizziness and giddiness: Secondary | ICD-10-CM | POA: Diagnosis not present

## 2016-08-13 DIAGNOSIS — M5481 Occipital neuralgia: Secondary | ICD-10-CM | POA: Diagnosis not present

## 2016-08-13 DIAGNOSIS — R2689 Other abnormalities of gait and mobility: Secondary | ICD-10-CM | POA: Diagnosis not present

## 2016-08-13 DIAGNOSIS — G4733 Obstructive sleep apnea (adult) (pediatric): Secondary | ICD-10-CM | POA: Diagnosis not present

## 2016-09-01 ENCOUNTER — Encounter: Payer: Self-pay | Admitting: Physician Assistant

## 2016-09-01 ENCOUNTER — Ambulatory Visit (INDEPENDENT_AMBULATORY_CARE_PROVIDER_SITE_OTHER): Payer: Medicare Other | Admitting: Physician Assistant

## 2016-09-01 VITALS — BP 150/72 | HR 64 | Temp 98.4°F | Resp 16 | Wt 165.0 lb

## 2016-09-01 DIAGNOSIS — R42 Dizziness and giddiness: Secondary | ICD-10-CM

## 2016-09-01 DIAGNOSIS — Z23 Encounter for immunization: Secondary | ICD-10-CM

## 2016-09-01 DIAGNOSIS — I10 Essential (primary) hypertension: Secondary | ICD-10-CM

## 2016-09-01 NOTE — Progress Notes (Signed)
Patient: Helen Ramsey Female    DOB: 1932/11/18   82 y.o.   MRN: [id number] Visit Date: 09/01/2016  Today's Provider: Mar Daring, PA-C   Chief Complaint  Patient presents with  . Hypertension   Subjective:    HPI  Hypertension, follow-up:  BP Readings from Last 3 Encounters:  09/01/16 (!) 150/72  07/23/16 (!) 110/56  07/15/16 (!) 157/64    She was last seen for hypertension 1 months ago.  BP at that visit was 110/56. Management changes since that visit include [place] decreased to 12.5 mg and discontinue Gabapentin. She reports excellent compliance with treatment. She is not having side effects.  She is not exercising. She is adherent to low salt diet.   Outside blood pressures are fluctuating. She is experiencing dizziness.  Patient denies chest pain.   Cardiovascular risk factors include advanced age (older than 43 for men, 48 for women), dyslipidemia and hypertension.  Use of agents associated with hypertension: none.     Weight trend: stable Wt Readings from Last 3 Encounters:  09/01/16 165 lb (74.8 kg)  07/23/16 170 lb (77.1 kg)  07/15/16 170 lb (77.1 kg)    Current diet: in general, a "healthy" diet    Dizziness continues after discontinuation of gabapentin and decreasing HCTZ to 12.5mg . Patient does ambulate with her rolling walker with seat. No falls recently. She does report that dizziness is improved but does still occur. ------------------------------------------------------------------------      Allergies  Allergen Reactions  . Amoxicillin   . Clindamycin/Lincomycin   . Doxycycline   . Levaquin [Levofloxacin In D5w]   . Mirabegron Nausea And Vomiting  . Nsaids Other (See Comments)    GI upset  . Oxycodone Nausea Only    Other reaction(s): Hallucination  . Phenergan [Promethazine Hcl]   . Singulair [Montelukast]   . Vicodin [Hydrocodone-Acetaminophen]   . Zocor [Simvastatin]   . Zoloft  [Sertraline] Other (See Comments)    . Ampicillin Rash  . Bactrim [Sulfamethoxazole-Trimethoprim] Rash  . Penicillins Rash    Ampicillin, Clindamycin, Doxycycline Levaquin-severe headache, muscle and joint aches, weak     Current Outpatient Prescriptions:  .  acetaminophen (TYLENOL) 500 MG tablet, Take 500 mg by mouth 3 (three) times daily. , Disp: , Rfl:  .  alendronate (FOSAMAX) 70 MG tablet, TAKE ONE TABLET EVERY WEEK AS DIRECTED, Disp: 12 tablet, Rfl: 1 .  atorvastatin (LIPITOR) 20 MG tablet, Take 1 tablet (20 mg total) by mouth daily at 6 PM., Disp: 90 tablet, Rfl: 3 .  azelastine (ASTELIN) 0.1 % nasal spray, Place 1 spray into both nostrils as needed for rhinitis. Use in each nostril as directed, Disp: , Rfl:  .  calcium carbonate (TUMS - DOSED IN MG ELEMENTAL CALCIUM) 500 MG chewable tablet, Chew 1 tablet by mouth as needed. , Disp: , Rfl:  .  Calcium Carbonate-Vitamin D 600-400 MG-UNIT tablet, Take 1 tablet by mouth 2 (two) times daily. (Patient taking differently: Take 1 tablet by mouth daily with supper. ), Disp: 180 tablet, Rfl: 1 .  cholecalciferol (VITAMIN D) 1000 units tablet, Take 1 tablet (1,000 Units total) by mouth daily., Disp: 1 tablet, Rfl: 0 .  docusate sodium (COLACE) 100 MG capsule, Take 100 mg by mouth as needed for mild constipation., Disp: , Rfl:  .  hydrochlorothiazide (HYDRODIURIL) 25 MG tablet, Take 1 tablet (25 mg total) by mouth daily. (Patient taking differently: Take 25 mg by mouth daily. 1/2 tablet), Disp: 90 tablet,  Rfl: 3 .  Magnesium Oxide, Antacid, 500 MG CAPS, Take 1 tablet by mouth daily with supper., Disp: , Rfl:  .  Omega-3 Fatty Acids (FISH OIL) 1200 MG CAPS, Take 2 capsules by mouth daily. , Disp: , Rfl:  .  omeprazole (PRILOSEC) 20 MG capsule, Take 1 capsule (20 mg total) by mouth daily., Disp: 90 capsule, Rfl: 3 .  phenazopyridine (AZO-TABS) 95 MG tablet, Take 1 tablet (95 mg total) by mouth as needed., Disp: 30 tablet, Rfl: 3 .  POTASSIUM GLUCONATE PO, Take 1 tablet by mouth daily  with supper., Disp: , Rfl:  .  Simethicone (GAS RELIEF PO), Take by mouth as needed. , Disp: , Rfl:  .  sodium chloride (OCEAN) 0.65 % nasal spray, Reported on 01/30/2016, Disp: , Rfl:  .  traMADol (ULTRAM) 50 MG tablet, Take 1 tablet (50 mg total) by mouth 2 (two) times daily as needed., Disp: 180 tablet, Rfl: 1 .  citalopram (CELEXA) 40 MG tablet, Take 1 tablet by mouth daily., Disp: , Rfl:  .  Melatonin 1 MG TABS, Take 1 tablet by mouth at bedtime., Disp: , Rfl:   Review of Systems  Constitutional: Negative.   Respiratory: Negative.   Cardiovascular: Negative.   Gastrointestinal: Negative.   Neurological: Positive for dizziness and headaches. Negative for weakness and numbness.  Psychiatric/Behavioral: Negative for agitation, decreased concentration, dysphoric mood and sleep disturbance. The patient is nervous/anxious.     Social History  Substance Use Topics  . Smoking status: Never Smoker  . Smokeless tobacco: Never Used  . Alcohol use No   Objective:   BP (!) 150/72 (BP Location: Left Arm, Patient Position: Sitting, Cuff Size: Large)   Pulse 64   Temp 98.4 F (36.9 C)   Resp 16   Wt 165 lb (74.8 kg)   SpO2 96%   BMI 33.33 kg/m   Physical Exam  Constitutional: She is oriented to person, place, and time. She appears well-developed and well-nourished. No distress.  Neck: Normal range of motion. Neck supple. No JVD present. No tracheal deviation present. No thyromegaly present.  Cardiovascular: Normal rate and regular rhythm.  Exam reveals no gallop and no friction rub.   Murmur heard. Pulmonary/Chest: Effort normal and breath sounds normal. No respiratory distress. She has no wheezes. She has no rales.  Musculoskeletal: She exhibits no edema.  Lymphadenopathy:    She has no cervical adenopathy.  Neurological: She is alert and oriented to person, place, and time.  Skin: She is not diaphoretic.  Psychiatric: She has a normal mood and affect. Her behavior is normal.  Judgment and thought content normal.  Vitals reviewed.     Assessment & Plan:     1. Essential (primary) hypertension Elevated today but at home readings have been ok. Will continue HCTZ 12.5mg . She is to call if readings become consistently elevated and will consider adding lisinopril 5mg , may discontinue HCTZ completely if BP lowers too much if Lisinopril has to be added. I will see her back in 3 months to recheck labs and BP.  2. Dizziness Improved some but still occurs. Continue to use walker for ambulation. She is to call if symptoms worsen.  3. Need for influenza vaccination Flu vaccine given today without complication. Patient sat upright for 15 minutes to check for adverse reaction before being released. - Flu vaccine HIGH DOSE PF       Mar Daring, PA-C  [facility]

## 2016-09-01 NOTE — Patient Instructions (Signed)
DASH Eating Plan  DASH stands for "Dietary Approaches to Stop Hypertension." The DASH eating plan is a healthy eating plan that has been shown to reduce high blood pressure (hypertension). Additional health benefits may include reducing the risk of type 2 diabetes mellitus, heart disease, and stroke. The DASH eating plan may also help with weight loss.  WHAT DO I NEED TO KNOW ABOUT THE DASH EATING PLAN?  For the DASH eating plan, you will follow these general guidelines:  · Choose foods with a percent daily value for sodium of less than 5% (as listed on the food label).  · Use salt-free seasonings or herbs instead of table salt or sea salt.  · Check with your health care provider or pharmacist before using salt substitutes.  · Eat lower-sodium products, often labeled as "lower sodium" or "no salt added."  · Eat fresh foods.  · Eat more vegetables, fruits, and low-fat dairy products.  · Choose whole grains. Look for the word "whole" as the first word in the ingredient list.  · Choose fish and skinless chicken or turkey more often than red meat. Limit fish, poultry, and meat to 6 oz (170 g) each day.  · Limit sweets, desserts, sugars, and sugary drinks.  · Choose heart-healthy fats.  · Limit cheese to 1 oz (28 g) per day.  · Eat more home-cooked food and less restaurant, buffet, and fast food.  · Limit fried foods.  · Cook foods using methods other than frying.  · Limit canned vegetables. If you do use them, rinse them well to decrease the sodium.  · When eating at a restaurant, ask that your food be prepared with less salt, or no salt if possible.  WHAT FOODS CAN I EAT?  Seek help from a dietitian for individual calorie needs.  Grains  Whole grain or whole wheat bread. Brown rice. Whole grain or whole wheat pasta. Quinoa, bulgur, and whole grain cereals. Low-sodium cereals. Corn or whole wheat flour tortillas. Whole grain cornbread. Whole grain crackers. Low-sodium crackers.  Vegetables  Fresh or frozen vegetables  (raw, steamed, roasted, or grilled). Low-sodium or reduced-sodium tomato and vegetable juices. Low-sodium or reduced-sodium tomato sauce and paste. Low-sodium or reduced-sodium canned vegetables.   Fruits  All fresh, canned (in natural juice), or frozen fruits.  Meat and Other Protein Products  Ground beef (85% or leaner), grass-fed beef, or beef trimmed of fat. Skinless chicken or turkey. Ground chicken or turkey. Pork trimmed of fat. All fish and seafood. Eggs. Dried beans, peas, or lentils. Unsalted nuts and seeds. Unsalted canned beans.  Dairy  Low-fat dairy products, such as skim or 1% milk, 2% or reduced-fat cheeses, low-fat ricotta or cottage cheese, or plain low-fat yogurt. Low-sodium or reduced-sodium cheeses.  Fats and Oils  Tub margarines without trans fats. Light or reduced-fat mayonnaise and salad dressings (reduced sodium). Avocado. Safflower, olive, or canola oils. Natural peanut or almond butter.  Other  Unsalted popcorn and pretzels.  The items listed above may not be a complete list of recommended foods or beverages. Contact your dietitian for more options.  WHAT FOODS ARE NOT RECOMMENDED?  Grains  White bread. White pasta. White rice. Refined cornbread. Bagels and croissants. Crackers that contain trans fat.  Vegetables  Creamed or fried vegetables. Vegetables in a cheese sauce. Regular canned vegetables. Regular canned tomato sauce and paste. Regular tomato and vegetable juices.  Fruits  Dried fruits. Canned fruit in light or heavy syrup. Fruit juice.  Meat and Other Protein   Products  Fatty cuts of meat. Ribs, chicken wings, bacon, sausage, bologna, salami, chitterlings, fatback, hot dogs, bratwurst, and packaged luncheon meats. Salted nuts and seeds. Canned beans with salt.  Dairy  Whole or 2% milk, cream, half-and-half, and cream cheese. Whole-fat or sweetened yogurt. Full-fat cheeses or blue cheese. Nondairy creamers and whipped toppings. Processed cheese, cheese spreads, or cheese  curds.  Condiments  Onion and garlic salt, seasoned salt, table salt, and sea salt. Canned and packaged gravies. Worcestershire sauce. Tartar sauce. Barbecue sauce. Teriyaki sauce. Soy sauce, including reduced sodium. Steak sauce. Fish sauce. Oyster sauce. Cocktail sauce. Horseradish. Ketchup and mustard. Meat flavorings and tenderizers. Bouillon cubes. Hot sauce. Tabasco sauce. Marinades. Taco seasonings. Relishes.  Fats and Oils  Butter, stick margarine, lard, shortening, ghee, and bacon fat. Coconut, palm kernel, or palm oils. Regular salad dressings.  Other  Pickles and olives. Salted popcorn and pretzels.  The items listed above may not be a complete list of foods and beverages to avoid. Contact your dietitian for more information.  WHERE CAN I FIND MORE INFORMATION?  National Heart, Lung, and [employer] Institute: www.nhlbi.nih.gov/health/health-topics/topics/dash/     This information is not intended to replace advice given to you by your health care provider. Make sure you discuss any questions you have with your health care provider.     Document Released: 10/23/2011 Document Revised: 11/24/2014 Document Reviewed: 09/07/2013  Elsevier Interactive Patient Education ©2016 [employer] Inc.

## 2016-09-02 ENCOUNTER — Other Ambulatory Visit: Payer: Self-pay | Admitting: Physician Assistant

## 2016-09-02 NOTE — Telephone Encounter (Signed)
Pt will need a refill on her citalopram 40 mg  Pt's call back [phone]  She uses [facility].  Thanks Con Memos

## 2016-09-03 MED ORDER — CITALOPRAM HYDROBROMIDE 40 MG PO TABS: 40.0000 mg | ORAL_TABLET | Freq: Every day | ORAL | 3 refills | Status: DC

## 2016-09-16 ENCOUNTER — Telehealth: Payer: Self-pay | Admitting: Physician Assistant

## 2016-09-16 MED ORDER — LISINOPRIL 5 MG PO TABS: 5.0000 mg | ORAL_TABLET | Freq: Every day | ORAL | 1 refills | Status: DC

## 2016-09-16 NOTE — Telephone Encounter (Signed)
Sent in lisinopril 5mg  to [facility] for her to take with HCTZ 12.5mg . Keep check on BP and if gets too low she needs to call the office.

## 2016-09-16 NOTE — Telephone Encounter (Signed)
Pt was in two weeks ago about her blood pressure running a little high.  She is calling back to report that her blood pressure is still running a little high.  Her last bp reading 145/69 that was this am.  She is still having headaches especially in the last couple of days.  Her call back [phone]  Thanks Con Memos

## 2016-09-16 NOTE — Telephone Encounter (Signed)
Patient advised as below. Patient verbalizes understanding and is in agreement with treatment plan.  

## 2016-09-29 ENCOUNTER — Ambulatory Visit (INDEPENDENT_AMBULATORY_CARE_PROVIDER_SITE_OTHER): Payer: Medicare Other | Admitting: Urology

## 2016-09-29 VITALS — BP 147/70 | HR 66 | Ht 60.0 in | Wt 163.7 lb

## 2016-09-29 DIAGNOSIS — N3946 Mixed incontinence: Secondary | ICD-10-CM | POA: Diagnosis not present

## 2016-09-29 NOTE — Progress Notes (Signed)
09/29/2016 10:58 AM   Helen Ramsey 04-23-1933 [id number]  Referring provider: Margarita Rana, MD No address on file  Chief Complaint  Patient presents with  . Follow-up    mixed incontinence     HPI: The patient for years has urinary incontinence. She time voids every 2-3 hours but she really does not complain of a lot of urge incontinence. 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 She might have some enuresis but sometimes her history was a little bit difficult to sort out. She leaks a small amount when she coughs and sneezes and Ends and less. She wears 1-2 heavy pads a day and sometimes or less wet and other times so.   She voids every every 3 hours with time voiding and gets up twice a night and reports a good flow    Well supported bladder neck with no stress incontinence. 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 She has risk factors for neurogenic bladder. Last visit I recommended Toviaz and cystoscopy. Voiding dynamics were discussed but not performed  Dr. Erlene Quan noted Lisbeth Ply was helping some but she may have had some dizziness or confusion and dry mouth. Her cystoscopic examination was normal  She still has baseline dizziness. Now that she is time voiding every 2 hours she is down to 1 pad a day and asked to very pleased. Clinically she is not infected and her frequency is stable          PMH: Past Medical History:  Diagnosis Date  . Allergic rhinitis, cause unspecified   . Allergic rhinitis, cause unspecified   . Atherosclerosis of aorta ([place])   . Back pain    lumbar  . Degeneration of lumbar or lumbosacral intervertebral disc   . Diverticulitis of colon (without mention of hemorrhage)(562.11)   . Dizziness and giddiness   . Dysthymic disorder   . Headache(784.0)   . Myalgia and myositis, unspecified   .  Osteoporosis, unspecified   . Other and unspecified hyperlipidemia   . Other diseases of nasal cavity and sinuses(478.19)   . Other specified disorders of bladder   . Syncope   . Trigger finger (acquired)   . Unspecified essential hypertension   . Unspecified sleep apnea   . Urinary frequency     Surgical History: Past Surgical History:  Procedure Laterality Date  . ABDOMINAL HYSTERECTOMY    . ADENOIDECTOMY    . APPENDECTOMY    . ARTHROSCOPIC KNEE SURGERY    . CARDIAC CATHETERIZATION     [facility]  . CARPAL TUNNEL RELEASE    . CATARACT EXTRACTION W/ INTRAOCULAR LENS IMPLANT & ANTERIOR VITRECTOMY, BILATERAL    . CHOLECYSTECTOMY    . KNEE ARTHROPLASTY Right 01/30/2016   Procedure: COMPUTER ASSISTED TOTAL KNEE ARTHROPLASTY;  Surgeon: Dereck Leep, MD;  Location: [facility] ORS;  Service: Orthopedics;  Laterality: Right;  . R/O Lymph Nodes; Right Axilla    . ROTATOR CUFF REPAIR Right   . SPG block  03/20/2016   sphenopalatine ganglion and maxillary division of trigeminal nerve block block Dr. Manuella Ghazi  . TEMPORAL ARTERY BIOPSY / LIGATION    . TONSILLECTOMY    . TUBAL LIGATION      Home Medications:    Medication List       Accurate as of 09/29/16 10:58 AM. Always use your most recent med list.  acetaminophen 500 MG tablet Commonly known as:  TYLENOL Take 500 mg by mouth 3 (three) times daily.   alendronate 70 MG tablet Commonly known as:  FOSAMAX TAKE ONE TABLET EVERY WEEK AS DIRECTED   atorvastatin 20 MG tablet Commonly known as:  LIPITOR Take 1 tablet (20 mg total) by mouth daily at 6 PM.   azelastine 0.1 % nasal spray Commonly known as:  ASTELIN Place 1 spray into both nostrils as needed for rhinitis. Use in each nostril as directed   calcium carbonate 500 MG chewable tablet Commonly known as:  TUMS - dosed in mg elemental calcium Chew 1 tablet by mouth as needed.   Calcium Carbonate-Vitamin D 600-400 MG-UNIT tablet Take 1 tablet by mouth 2 (two) times daily.    cholecalciferol 1000 units tablet Commonly known as:  VITAMIN D Take 1 tablet (1,000 Units total) by mouth daily.   citalopram 40 MG tablet Commonly known as:  CELEXA Take 1 tablet (40 mg total) by mouth daily.   docusate sodium 100 MG capsule Commonly known as:  COLACE Take 100 mg by mouth as needed for mild constipation.   Fish Oil 1200 MG Caps Take 2 capsules by mouth daily.   GAS RELIEF PO Take by mouth as needed.   hydrochlorothiazide 25 MG tablet Commonly known as:  HYDRODIURIL Take 1 tablet (25 mg total) by mouth daily.   lisinopril 5 MG tablet Commonly known as:  PRINIVIL,ZESTRIL Take 1 tablet (5 mg total) by mouth daily.   Magnesium Oxide (Antacid) 500 MG Caps Take 1 tablet by mouth daily with supper.   Melatonin 1 MG Tabs Take 1 tablet by mouth at bedtime.   omeprazole 20 MG capsule Commonly known as:  PRILOSEC Take 1 capsule (20 mg total) by mouth daily.   phenazopyridine 95 MG tablet Commonly known as:  AZO-TABS Take 1 tablet (95 mg total) by mouth as needed.   POTASSIUM GLUCONATE PO Take 1 tablet by mouth daily with supper.   sodium chloride 0.65 % nasal spray Commonly known as:  OCEAN Reported on 01/30/2016   traMADol 50 MG tablet Commonly known as:  ULTRAM Take 1 tablet (50 mg total) by mouth 2 (two) times daily as needed.       Allergies:  Allergies  Allergen Reactions  . Amoxicillin   . Clindamycin/Lincomycin   . Doxycycline   . Levaquin [Levofloxacin In D5w]   . Mirabegron Nausea And Vomiting  . Nsaids Other (See Comments)    GI upset  . Oxycodone Nausea Only    Other reaction(s): Hallucination  . Phenergan [Promethazine Hcl]   . Singulair [Montelukast]   . Vicodin [Hydrocodone-Acetaminophen]   . Zocor [Simvastatin]   . Zoloft  [Sertraline] Other (See Comments)  . Ampicillin Rash  . Bactrim [Sulfamethoxazole-Trimethoprim] Rash  . Penicillins Rash    Ampicillin, Clindamycin, Doxycycline Levaquin-severe headache, muscle and  joint aches, weak    Family History: Family History  Problem Relation Age of Onset  . Heart disease Mother   . Depression Mother   . Colon polyps Mother   . Dementia Mother   . Heart disease Father   . Dementia Father   . Heart disease Brother   . Hypertension Brother   . Heart disease Brother   . Hypertension Brother   . Kidney cancer Neg Hx   . Bladder Cancer Neg Hx     Social History:  reports that she has never smoked. She has never used smokeless tobacco. She reports that she  does not drink alcohol or use drugs.  ROS: UROLOGY Frequent Urination?: No Hard to postpone urination?: No Burning/pain with urination?: No Get up at night to urinate?: Yes Leakage of urine?: Yes Urine stream starts and stops?: No Trouble starting stream?: No Do you have to strain to urinate?: No Blood in urine?: No Urinary tract infection?: No Sexually transmitted disease?: No Injury to kidneys or bladder?: No Painful intercourse?: No Weak stream?: No Currently pregnant?: No Vaginal bleeding?: No Last menstrual period?: n  Gastrointestinal Nausea?: No Vomiting?: No Indigestion/heartburn?: No Diarrhea?: Yes Constipation?: Yes  Constitutional Fever: No Night sweats?: No Weight loss?: No Fatigue?: No  Skin Skin rash/lesions?: No Itching?: Yes  Eyes Blurred vision?: No Double vision?: No  Ears/Nose/Throat Sore throat?: No Sinus problems?: Yes  Hematologic/Lymphatic Swollen glands?: No Easy bruising?: Yes  Cardiovascular Leg swelling?: No Chest pain?: No  Respiratory Cough?: No Shortness of breath?: Yes  Endocrine Excessive thirst?: No  Musculoskeletal Back pain?: Yes Joint pain?: Yes  Neurological Headaches?: Yes Dizziness?: Yes  Psychologic Depression?: Yes Anxiety?: Yes  Physical Exam: BP (!) 147/70   Pulse 66   Ht 5' (1.524 m)   Wt 163 lb 11.2 oz (74.3 kg)   BMI 31.97 kg/m     Laboratory Data: Lab Results  Component Value Date   WBC  7.1 07/23/2016   HGB 12.6 02/18/2016   HCT 38.3 07/23/2016   MCV 87 07/23/2016   PLT 245 07/23/2016    Lab Results  Component Value Date   CREATININE 0.71 07/23/2016    No results found for: PSA  No results found for: TESTOSTERONE  No results found for: HGBA1C  Urinalysis    Component Value Date/Time   COLORURINE STRAW (A) 01/16/2016 1337   APPEARANCEUR Hazy (A) 07/15/2016 1548   LABSPEC 1.008 01/16/2016 1337   PHURINE 6.0 01/16/2016 1337   GLUCOSEU Negative 07/15/2016 1548   HGBUR NEGATIVE 01/16/2016 1337   BILIRUBINUR Negative 07/15/2016 1548   KETONESUR NEGATIVE 01/16/2016 1337   PROTEINUR Negative 07/15/2016 1548   PROTEINUR NEGATIVE 01/16/2016 1337   NITRITE Negative 07/15/2016 1548   NITRITE NEGATIVE 01/16/2016 1337   LEUKOCYTESUR Trace (A) 07/15/2016 1548    Pertinent Imaging: none  Assessment & Plan:  The patient's urgency incontinence frequency are much improved. I will see her when necessary. We agree to stay off all medication  1. Urgency incontinence 2. Urinary frequency  There are no diagnoses linked to this encounter.  Return if symptoms worsen or fail to improve.  Reece Packer, MD  [facility] [address], [place], [place] [address] [phone]

## 2016-10-01 ENCOUNTER — Other Ambulatory Visit: Payer: Self-pay

## 2016-10-01 DIAGNOSIS — M1990 Unspecified osteoarthritis, unspecified site: Secondary | ICD-10-CM

## 2016-10-01 MED ORDER — TRAMADOL HCL 50 MG PO TABS: 50.0000 mg | ORAL_TABLET | Freq: Two times a day (BID) | ORAL | 1 refills | Status: DC | PRN

## 2016-10-01 NOTE — Telephone Encounter (Signed)
Last refilled 05/09/16

## 2016-10-30 ENCOUNTER — Ambulatory Visit (INDEPENDENT_AMBULATORY_CARE_PROVIDER_SITE_OTHER): Payer: Medicare Other | Admitting: Physician Assistant

## 2016-10-30 ENCOUNTER — Encounter: Payer: Self-pay | Admitting: Physician Assistant

## 2016-10-30 ENCOUNTER — Other Ambulatory Visit: Payer: Self-pay | Admitting: Family Medicine

## 2016-10-30 VITALS — BP 138/72 | HR 76 | Temp 98.5°F | Resp 16 | Wt 158.0 lb

## 2016-10-30 DIAGNOSIS — J069 Acute upper respiratory infection, unspecified: Secondary | ICD-10-CM | POA: Diagnosis not present

## 2016-10-30 DIAGNOSIS — J309 Allergic rhinitis, unspecified: Secondary | ICD-10-CM

## 2016-10-30 DIAGNOSIS — R059 Cough, unspecified: Secondary | ICD-10-CM

## 2016-10-30 DIAGNOSIS — R05 Cough: Secondary | ICD-10-CM

## 2016-10-30 MED ORDER — BENZONATATE 100 MG PO CAPS: 100.0000 mg | ORAL_CAPSULE | Freq: Three times a day (TID) | ORAL | 0 refills | Status: DC | PRN

## 2016-10-30 MED ORDER — AZITHROMYCIN 250 MG PO TABS: ORAL_TABLET | ORAL | 1 refills | Status: DC

## 2016-10-30 MED ORDER — HYDROCOD POLST-CPM POLST ER 10-8 MG/5ML PO SUER: 5.0000 mL | Freq: Every evening | ORAL | 0 refills | Status: DC | PRN

## 2016-10-30 NOTE — Patient Instructions (Signed)
Upper Respiratory Infection, Adult Most upper respiratory infections (URIs) are a viral infection of the air passages leading to the lungs. A URI affects the nose, throat, and upper air passages. The most common type of URI is nasopharyngitis and is typically referred to as "the common cold." URIs run their course and usually go away on their own. Most of the time, a URI does not require medical attention, but sometimes a bacterial infection in the upper airways can follow a viral infection. This is called a secondary infection. Sinus and middle ear infections are common types of secondary upper respiratory infections. Bacterial pneumonia can also complicate a URI. A URI can worsen asthma and chronic obstructive pulmonary disease (COPD). Sometimes, these complications can require emergency medical care and may be life threatening. What are the causes? Almost all URIs are caused by viruses. A virus is a type of germ and can spread from one person to another. What increases the risk? You may be at risk for a URI if:  You smoke.  You have chronic heart or lung disease.  You have a weakened defense (immune) system.  You are very young or very old.  You have nasal allergies or asthma.  You work in crowded or poorly ventilated areas.  You work in health care facilities or schools.  What are the signs or symptoms? Symptoms typically develop 2-3 days after you come in contact with a cold virus. Most viral URIs last 7-10 days. However, viral URIs from the influenza virus (flu virus) can last 14-18 days and are typically more severe. Symptoms may include:  Runny or stuffy (congested) nose.  Sneezing.  Cough.  Sore throat.  Headache.  Fatigue.  Fever.  Loss of appetite.  Pain in your forehead, behind your eyes, and over your cheekbones (sinus pain).  Muscle aches.  How is this diagnosed? Your health care provider may diagnose a URI by:  Physical exam.  Tests to check that your  symptoms are not due to another condition such as: ? Strep throat. ? Sinusitis. ? Pneumonia. ? Asthma.  How is this treated? A URI goes away on its own with time. It cannot be cured with medicines, but medicines may be prescribed or recommended to relieve symptoms. Medicines may help:  Reduce your fever.  Reduce your cough.  Relieve nasal congestion.  Follow these instructions at home:  Take medicines only as directed by your health care provider.  Gargle warm saltwater or take cough drops to comfort your throat as directed by your health care provider.  Use a warm mist humidifier or inhale steam from a shower to increase air moisture. This may make it easier to breathe.  Drink enough fluid to keep your urine clear or pale yellow.  Eat soups and other clear broths and maintain good nutrition.  Rest as needed.  Return to work when your temperature has returned to normal or as your health care provider advises. You may need to stay home longer to avoid infecting others. You can also use a face mask and careful hand washing to prevent spread of the virus.  Increase the usage of your inhaler if you have asthma.  Do not use any tobacco products, including cigarettes, chewing tobacco, or electronic cigarettes. If you need help quitting, ask your health care provider. How is this prevented? The best way to protect yourself from getting a cold is to practice good hygiene.  Avoid oral or hand contact with people with cold symptoms.  Wash your   hands often if contact occurs.  There is no clear evidence that vitamin C, vitamin E, echinacea, or exercise reduces the chance of developing a cold. However, it is always recommended to get plenty of rest, exercise, and practice good nutrition. Contact a health care provider if:  You are getting worse rather than better.  Your symptoms are not controlled by medicine.  You have chills.  You have worsening shortness of breath.  You have  brown or red mucus.  You have yellow or brown nasal discharge.  You have pain in your face, especially when you bend forward.  You have a fever.  You have swollen neck glands.  You have pain while swallowing.  You have white areas in the back of your throat. Get help right away if:  You have severe or persistent: ? Headache. ? Ear pain. ? Sinus pain. ? Chest pain.  You have chronic lung disease and any of the following: ? Wheezing. ? Prolonged cough. ? Coughing up blood. ? A change in your usual mucus.  You have a stiff neck.  You have changes in your: ? Vision. ? Hearing. ? Thinking. ? Mood. This information is not intended to replace advice given to you by your health care provider. Make sure you discuss any questions you have with your health care provider. Document Released: 04/29/2001 Document Revised: 07/06/2016 Document Reviewed: 02/08/2014 Elsevier Interactive Patient Education  2017 [employer].  

## 2016-10-30 NOTE — Progress Notes (Signed)
Patient: Helen Ramsey Female    DOB: [birth year]   82 y.o.   MRN: [id number] Visit Date: 10/30/2016  Today's Provider: Mar Daring, PA-C   Chief Complaint  Patient presents with  . URI   Subjective:    URI   This is a new problem. Episode onset: x 2 weeks. Progression since onset: pt reports she was feeling better, but then got worse. Maximum temperature: around 99 per pt. Associated symptoms include chest pain (tightness), congestion, coughing (productive with green sputum), headaches, nausea (slight), a plugged ear sensation ("popping"), rhinorrhea, sinus pain and a sore throat. Pertinent negatives include no abdominal pain, dysuria, ear pain, sneezing, swollen glands, vomiting or wheezing. Treatments tried: Mucinex, Mucinex D, Benadryl. The treatment provided mild relief.       Allergies  Allergen Reactions  . Amoxicillin   . Clindamycin/Lincomycin   . Doxycycline   . Levaquin [Levofloxacin In D5w]   . Mirabegron Nausea And Vomiting  . Nsaids Other (See Comments)    GI upset  . Oxycodone Nausea Only    Other reaction(s): Hallucination  . Phenergan [Promethazine Hcl]   . Singulair [Montelukast]   . Vicodin [Hydrocodone-Acetaminophen]   . Zocor [Simvastatin]   . Zoloft  [Sertraline] Other (See Comments)  . Ampicillin Rash  . Bactrim [Sulfamethoxazole-Trimethoprim] Rash  . Penicillins Rash    Ampicillin, Clindamycin, Doxycycline Levaquin-severe headache, muscle and joint aches, weak     Current Outpatient Prescriptions:  .  acetaminophen (TYLENOL) 500 MG tablet, Take 500 mg by mouth 3 (three) times daily. , Disp: , Rfl:  .  alendronate (FOSAMAX) 70 MG tablet, TAKE ONE TABLET EVERY WEEK AS DIRECTED, Disp: 12 tablet, Rfl: 1 .  atorvastatin (LIPITOR) 20 MG tablet, Take 1 tablet (20 mg total) by mouth daily at 6 PM., Disp: 90 tablet, Rfl: 3 .  azelastine (ASTELIN) 0.1 % nasal spray, Place 1 spray into both nostrils as needed for rhinitis. Use in each  nostril as directed, Disp: , Rfl:  .  calcium carbonate (TUMS - DOSED IN MG ELEMENTAL CALCIUM) 500 MG chewable tablet, Chew 1 tablet by mouth as needed. , Disp: , Rfl:  .  Calcium Carbonate-Vitamin D 600-400 MG-UNIT tablet, Take 1 tablet by mouth 2 (two) times daily. (Patient taking differently: Take 1 tablet by mouth daily with supper. ), Disp: 180 tablet, Rfl: 1 .  cholecalciferol (VITAMIN D) 1000 units tablet, Take 1 tablet (1,000 Units total) by mouth daily., Disp: 1 tablet, Rfl: 0 .  citalopram (CELEXA) 40 MG tablet, Take 1 tablet (40 mg total) by mouth daily., Disp: 90 tablet, Rfl: 3 .  docusate sodium (COLACE) 100 MG capsule, Take 100 mg by mouth as needed for mild constipation., Disp: , Rfl:  .  hydrochlorothiazide (HYDRODIURIL) 25 MG tablet, Take 1 tablet (25 mg total) by mouth daily. (Patient taking differently: Take 25 mg by mouth daily. 1/2 tablet), Disp: 90 tablet, Rfl: 3 .  lisinopril (PRINIVIL,ZESTRIL) 5 MG tablet, Take 1 tablet (5 mg total) by mouth daily., Disp: 30 tablet, Rfl: 1 .  Magnesium Oxide, Antacid, 500 MG CAPS, Take 1 tablet by mouth daily with supper., Disp: , Rfl:  .  Melatonin 1 MG TABS, Take 1 tablet by mouth at bedtime., Disp: , Rfl:  .  Omega-3 Fatty Acids (FISH OIL) 1200 MG CAPS, Take 2 capsules by mouth daily. , Disp: , Rfl:  .  omeprazole (PRILOSEC) 20 MG capsule, Take 1 capsule (20 mg total) by mouth  daily., Disp: 90 capsule, Rfl: 3 .  phenazopyridine (AZO-TABS) 95 MG tablet, Take 1 tablet (95 mg total) by mouth as needed., Disp: 30 tablet, Rfl: 3 .  POTASSIUM GLUCONATE PO, Take 1 tablet by mouth daily with supper., Disp: , Rfl:  .  Simethicone (GAS RELIEF PO), Take by mouth as needed. , Disp: , Rfl:  .  sodium chloride (OCEAN) 0.65 % nasal spray, Reported on 01/30/2016, Disp: , Rfl:  .  traMADol (ULTRAM) 50 MG tablet, Take 1 tablet (50 mg total) by mouth 2 (two) times daily as needed., Disp: 180 tablet, Rfl: 1  Review of Systems  Constitutional: Positive for  fatigue and fever (low grade; 99.4). Negative for chills.  HENT: Positive for congestion, postnasal drip, rhinorrhea, sinus pain and sore throat. Negative for ear pain, sinus pressure, sneezing, tinnitus and trouble swallowing.   Respiratory: Positive for cough (productive with green sputum). Negative for chest tightness, shortness of breath and wheezing.   Cardiovascular: Positive for chest pain (tightness). Negative for palpitations and leg swelling.  Gastrointestinal: Positive for nausea (slight). Negative for abdominal pain and vomiting.  Genitourinary: Negative for dysuria.  Neurological: Positive for headaches. Negative for dizziness.    Social History  Substance Use Topics  . Smoking status: Never Smoker  . Smokeless tobacco: Never Used  . Alcohol use No   Objective:   BP 138/72 (BP Location: Left Arm, Patient Position: Sitting, Cuff Size: Normal)   Pulse 76   Temp 98.5 F (36.9 C) (Oral)   Resp 16   Wt 158 lb (71.7 kg)   SpO2 95%   BMI 30.86 kg/m   Physical Exam  Constitutional: She appears well-developed and well-nourished. No distress.  HENT:  Head: Normocephalic and atraumatic.  Right Ear: Hearing, tympanic membrane, external ear and ear canal normal.  Left Ear: Hearing, tympanic membrane, external ear and ear canal normal.  Nose: Mucosal edema and rhinorrhea present. Right sinus exhibits no maxillary sinus tenderness and no frontal sinus tenderness. Left sinus exhibits no maxillary sinus tenderness and no frontal sinus tenderness.  Mouth/Throat: Uvula is midline and mucous membranes are normal. Posterior oropharyngeal erythema (cobblestoning) present. No oropharyngeal exudate or posterior oropharyngeal edema.  Eyes: Conjunctivae are normal. Pupils are equal, round, and reactive to light. Right eye exhibits no discharge. Left eye exhibits no discharge. No scleral icterus.  Neck: Normal range of motion. Neck supple. No tracheal deviation present. No thyromegaly present.    Cardiovascular: Normal rate and regular rhythm.  Exam reveals no gallop and no friction rub.   Murmur heard. Pulmonary/Chest: Effort normal and breath sounds normal. No stridor. No respiratory distress. She has no wheezes. She has no rales.  Lymphadenopathy:    She has no cervical adenopathy.  Skin: Skin is warm and dry. She is not diaphoretic.  Vitals reviewed.      Assessment & Plan:     1. Upper respiratory tract infection, unspecified type Worsening symptoms. I will give her Z-Pak as below with 1 refill. She may continue Mucinex for congestion. I have also given her Tessalon Perles for daytime cough and Tussionex cough syrup for nighttime cough. Patient was advised of drowsiness precautions with Tussionex. She needs to make sure to stay well-hydrated and get plenty of rest. She is to call the office if her symptoms worsen and do not improve. - benzonatate (TESSALON) 100 MG capsule; Take 1 capsule (100 mg total) by mouth 3 (three) times daily as needed for cough.  Dispense: 30 capsule; Refill: 0 -  chlorpheniramine-HYDROcodone (TUSSIONEX PENNKINETIC ER) 10-8 MG/5ML SUER; Take 5 mLs by mouth at bedtime as needed for cough.  Dispense: 140 mL; Refill: 0 - azithromycin (ZITHROMAX) 250 MG tablet; Take 2 tablets PO on day one, and one tablet PO daily thereafter until completed.  Dispense: 6 tablet; Refill: 1  2. Cough See above medical treatment plan. - benzonatate (TESSALON) 100 MG capsule; Take 1 capsule (100 mg total) by mouth 3 (three) times daily as needed for cough.  Dispense: 30 capsule; Refill: 0 - chlorpheniramine-HYDROcodone (TUSSIONEX PENNKINETIC ER) 10-8 MG/5ML SUER; Take 5 mLs by mouth at bedtime as needed for cough.  Dispense: 140 mL; Refill: 0 - azithromycin (ZITHROMAX) 250 MG tablet; Take 2 tablets PO on day one, and one tablet PO daily thereafter until completed.  Dispense: 6 tablet; Refill: 1     Patient seen and examined by Mar Daring, PA-C, and note scribed by  Renaldo Fiddler, CMA.   Mar Daring, PA-C  [facility]

## 2016-10-31 NOTE — Telephone Encounter (Signed)
Please review-aa 

## 2016-11-03 DIAGNOSIS — L821 Other seborrheic keratosis: Secondary | ICD-10-CM | POA: Diagnosis not present

## 2016-11-03 DIAGNOSIS — D1801 Hemangioma of skin and subcutaneous tissue: Secondary | ICD-10-CM | POA: Diagnosis not present

## 2016-11-03 DIAGNOSIS — D485 Neoplasm of uncertain behavior of skin: Secondary | ICD-10-CM | POA: Diagnosis not present

## 2016-11-24 DIAGNOSIS — G609 Hereditary and idiopathic neuropathy, unspecified: Secondary | ICD-10-CM | POA: Diagnosis not present

## 2016-11-24 DIAGNOSIS — R269 Unspecified abnormalities of gait and mobility: Secondary | ICD-10-CM | POA: Diagnosis not present

## 2016-12-01 ENCOUNTER — Ambulatory Visit (INDEPENDENT_AMBULATORY_CARE_PROVIDER_SITE_OTHER): Payer: Medicare Other | Admitting: Physician Assistant

## 2016-12-01 ENCOUNTER — Encounter: Payer: Self-pay | Admitting: Physician Assistant

## 2016-12-01 VITALS — BP 132/62 | HR 68 | Temp 98.2°F | Resp 16 | Wt 163.0 lb

## 2016-12-01 DIAGNOSIS — K219 Gastro-esophageal reflux disease without esophagitis: Secondary | ICD-10-CM | POA: Diagnosis not present

## 2016-12-01 DIAGNOSIS — J324 Chronic pansinusitis: Secondary | ICD-10-CM

## 2016-12-01 DIAGNOSIS — I10 Essential (primary) hypertension: Secondary | ICD-10-CM

## 2016-12-01 DIAGNOSIS — D472 Monoclonal gammopathy: Secondary | ICD-10-CM | POA: Diagnosis not present

## 2016-12-01 DIAGNOSIS — K449 Diaphragmatic hernia without obstruction or gangrene: Secondary | ICD-10-CM | POA: Diagnosis not present

## 2016-12-01 MED ORDER — AZELASTINE HCL 0.1 % NA SOLN: 1.0000 | NASAL | 1 refills | Status: DC | PRN

## 2016-12-01 MED ORDER — RANITIDINE HCL 150 MG PO TABS: 150.0000 mg | ORAL_TABLET | Freq: Two times a day (BID) | ORAL | 1 refills | Status: DC

## 2016-12-01 NOTE — Progress Notes (Addendum)
Patient: Helen Ramsey Female    DOB: Jan 07, 1933   83 y.o.   MRN: [id number] Visit Date: 12/01/2016  Today's Provider: Mar Daring, PA-C   Chief Complaint  Patient presents with  . Hypertension   Subjective:    HPI  Hypertension, follow-up:  BP Readings from Last 3 Encounters:  12/01/16 132/62  10/30/16 138/72  09/29/16 (!) 147/70    She was last seen for hypertension 3 months ago.  BP at that visit was 150/72. Management changes since that visit include add Lisinopril 5 mg. She reports excellent compliance with treatment. She is not having side effects.  She is exercising. She is adherent to low salt diet.   Outside blood pressures are stable. She is experiencing none.  Patient denies chest pain.   Cardiovascular risk factors include advanced age (older than 11 for men, 6 for women) and hypertension.  Use of agents associated with hypertension: none.     Weight trend: stable Wt Readings from Last 3 Encounters:  12/01/16 163 lb (73.9 kg)  10/30/16 158 lb (71.7 kg)  09/29/16 163 lb 11.2 oz (74.3 kg)    Current diet: in general, a "healthy" diet    ------------------------------------------------------------------------     Allergies  Allergen Reactions  . Amoxicillin   . Clindamycin/Lincomycin   . Doxycycline   . Levaquin [Levofloxacin In D5w]   . Mirabegron Nausea And Vomiting  . Nsaids Other (See Comments)    GI upset  . Oxycodone Nausea Only    Other reaction(s): Hallucination  . Phenergan [Promethazine Hcl]   . Singulair [Montelukast]   . Vicodin [Hydrocodone-Acetaminophen]   . Zocor [Simvastatin]   . Zoloft  [Sertraline] Other (See Comments)  . Ampicillin Rash  . Bactrim [Sulfamethoxazole-Trimethoprim] Rash  . Penicillins Rash    Ampicillin, Clindamycin, Doxycycline Levaquin-severe headache, muscle and joint aches, weak   Patient Active Problem List   Diagnosis Date Noted  . Dorsalgia 03/31/2016  . H/O total knee  replacement 02/14/2016  . S/P total knee arthroplasty 01/30/2016  . Systolic ejection murmur 30/07/2329  . Cardiac murmur 01/07/2016  . Other specified anxiety disorders 12/19/2015  . Vitamin D deficiency 11/29/2015  . Allergy to other foods 11/29/2015  . Headache 10/28/2015  . Leg cramps 10/24/2015  . Cramp and spasm 10/24/2015  . Knee pain, bilateral 08/29/2015  . Sleep apnea 05/30/2015  . Benign neoplasm of colon 05/09/2015  . Other hyperlipidemia 05/09/2015  . Restless legs syndrome 05/09/2015  . Allergic rhinitis 03/28/2015  . Anemia, unspecified 03/28/2015  . Anxiety and depression 03/28/2015  . Arthritis 03/28/2015  . Neck pain 03/28/2015  . Chronic headache 03/28/2015  . History of colonic polyps 03/28/2015  . H/O: osteoarthritis 03/28/2015  . Depression 03/28/2015  . Diverticulosis of colon 03/28/2015  . DDD (degenerative disc disease), lumbar 03/28/2015  . Gastro-esophageal reflux disease without esophagitis 03/28/2015  . 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 Essential (primary) hypertension 03/28/2015  . Adaptive colitis 03/28/2015  . Narrowing of intervertebral disc space 03/28/2015  . Nasal septal perforation 03/28/2015  . Recurrent sinus infections 03/28/2015  . Spinal stenosis 03/28/2015  . Gastric ulcer without hemorrhage or perforation 03/28/2015  . Cerebral artery occlusion with cerebral infarction ([place]) 03/28/2015  . Cerebral infarction due to unspecified occlusion or stenosis of unspecified cerebral artery ([place]) 03/28/2015  . Chronic sinusitis 03/28/2015  . Connective tissue and disc stenosis of intervertebral foramina of abdomen and other regions 03/28/2015  . Diverticulosis of large intestine  without perforation or abscess without bleeding 03/28/2015  . Major depressive disorder, single episode 03/28/2015  . Osteoarthritis 03/28/2015  . Other specified disorders of nose and nasal sinuses 03/28/2015  . Other specified  functional intestinal disorders 03/28/2015  . Personal history of other diseases of the musculoskeletal system and connective tissue 03/28/2015  . Hardening of the aorta (main artery of the heart) ([place]) 07/13/2014  . H/O adenomatous polyp of colon 06/27/2005      Current Outpatient Prescriptions:  .  acetaminophen (TYLENOL) 500 MG tablet, Take 500 mg by mouth 3 (three) times daily. , Disp: , Rfl:  .  alendronate (FOSAMAX) 70 MG tablet, TAKE ONE TABLET EVERY WEEK AS DIRECTED, Disp: 12 tablet, Rfl: 1 .  atorvastatin (LIPITOR) 20 MG tablet, Take 1 tablet (20 mg total) by mouth daily at 6 PM., Disp: 90 tablet, Rfl: 3 .  azelastine (ASTELIN) 0.1 % nasal spray, Place 1 spray into both nostrils as needed for rhinitis. Use in each nostril as directed, Disp: , Rfl:  .  calcium carbonate (TUMS - DOSED IN MG ELEMENTAL CALCIUM) 500 MG chewable tablet, Chew 1 tablet by mouth as needed. , Disp: , Rfl:  .  Calcium Carbonate-Vitamin D 600-400 MG-UNIT tablet, Take 1 tablet by mouth 2 (two) times daily. (Patient taking differently: Take 1 tablet by mouth daily with supper. ), Disp: 180 tablet, Rfl: 1 .  cholecalciferol (VITAMIN D) 1000 units tablet, Take 1 tablet (1,000 Units total) by mouth daily., Disp: 1 tablet, Rfl: 0 .  citalopram (CELEXA) 40 MG tablet, Take 1 tablet (40 mg total) by mouth daily., Disp: 90 tablet, Rfl: 3 .  docusate sodium (COLACE) 100 MG capsule, Take 100 mg by mouth as needed for mild constipation., Disp: , Rfl:  .  hydrochlorothiazide (HYDRODIURIL) 25 MG tablet, Take 1 tablet (25 mg total) by mouth daily. (Patient taking differently: Take 12.5 mg by mouth daily. 1/2 tablet), Disp: 90 tablet, Rfl: 3 .  lisinopril (PRINIVIL,ZESTRIL) 5 MG tablet, Take 1 tablet (5 mg total) by mouth daily., Disp: 30 tablet, Rfl: 1 .  Magnesium Oxide, Antacid, 500 MG CAPS, Take 1 tablet by mouth daily with supper., Disp: , Rfl:  .  Melatonin 1 MG TABS, Take 1 tablet by mouth at bedtime., Disp: , Rfl:  .   Omega-3 Fatty Acids (FISH OIL) 1200 MG CAPS, Take 2 capsules by mouth daily. , Disp: , Rfl:  .  omeprazole (PRILOSEC) 20 MG capsule, Take 1 capsule (20 mg total) by mouth daily., Disp: 90 capsule, Rfl: 3 .  phenazopyridine (AZO-TABS) 95 MG tablet, Take 1 tablet (95 mg total) by mouth as needed., Disp: 30 tablet, Rfl: 3 .  POTASSIUM GLUCONATE PO, Take 1 tablet by mouth daily with supper., Disp: , Rfl:  .  Simethicone (GAS RELIEF PO), Take by mouth as needed. , Disp: , Rfl:  .  sodium chloride (OCEAN) 0.65 % nasal spray, Reported on 01/30/2016, Disp: , Rfl:  .  traMADol (ULTRAM) 50 MG tablet, Take 1 tablet (50 mg total) by mouth 2 (two) times daily as needed., Disp: 180 tablet, Rfl: 1  Review of Systems  Constitutional: Negative.   Respiratory: Negative.   Cardiovascular: Negative.   Gastrointestinal: Negative.   Neurological: Negative.     Social History  Substance Use Topics  . Smoking status: Never Smoker  . Smokeless tobacco: Never Used  . Alcohol use No   Objective:   BP 132/62 (BP Location: Left Arm, Patient Position: Sitting, Cuff Size: Large)  Pulse 68   Temp 98.2 F (36.8 C) (Oral)   Resp 16   Wt 163 lb (73.9 kg)   SpO2 99%   BMI 31.83 kg/m   Physical Exam  Constitutional: She appears well-developed and well-nourished. No distress.  Neck: Normal range of motion. Neck supple.  Cardiovascular: Normal rate and regular rhythm.  Exam reveals no gallop and no friction rub.   Murmur heard.  Systolic murmur is present  Pulmonary/Chest: Effort normal and breath sounds normal. No respiratory distress. She has no wheezes. She has no rales.  Skin: She is not diaphoretic.  Vitals reviewed.      Assessment & Plan:     1. Essential (primary) hypertension Stable. Continue lisinopril 5mg  and HCTZ 12.5mg . I will see her back in 4 months for CPE/AWV.  2. Gastroesophageal reflux disease, esophagitis presence not specified Stable. Diagnosis pulled for medication refill. Continue  current medical treatment plan. - ranitidine (ZANTAC) 150 MG tablet; Take 1 tablet (150 mg total) by mouth 2 (two) times daily.  Dispense: 180 tablet; Refill: 1  3. Hiatal hernia Stable. Diagnosis pulled for medication refill. Continue current medical treatment plan. - ranitidine (ZANTAC) 150 MG tablet; Take 1 tablet (150 mg total) by mouth 2 (two) times daily.  Dispense: 180 tablet; Refill: 1  4. Chronic pansinusitis Stable. Diagnosis pulled for medication refill. Continue current medical treatment plan. - azelastine (ASTELIN) 0.1 % nasal spray; Place 1 spray into both nostrils as needed for rhinitis. Use in each nostril as directed  Dispense: 30 mL; Refill: 1  5. Monoclonal gammopathy of unknown significance (MGUS) Just recently found on labs done by her Neurologist, Dr. Ernst Bowler. He has referred her already to [facility] for further evaluation. This appt is scheduled in the coming weeks.        Mar Daring, PA-C  [facility]

## 2016-12-01 NOTE — Patient Instructions (Signed)
DASH Eating Plan DASH stands for "Dietary Approaches to Stop Hypertension." The DASH eating plan is a healthy eating plan that has been shown to reduce high blood pressure (hypertension). Additional health benefits may include reducing the risk of type 2 diabetes mellitus, heart disease, and stroke. The DASH eating plan may also help with weight loss. What do I need to know about the DASH eating plan? For the DASH eating plan, you will follow these general guidelines:  Choose foods with less than 150 milligrams of sodium per serving (as listed on the food label).  Use salt-free seasonings or herbs instead of table salt or sea salt.  Check with your health care provider or pharmacist before using salt substitutes.  Eat lower-sodium products. These are often labeled as "low-sodium" or "no salt added."  Eat fresh foods. Avoid eating a lot of canned foods.  Eat more vegetables, fruits, and low-fat dairy products.  Choose whole grains. Look for the word "whole" as the first word in the ingredient list.  Choose fish and skinless chicken or turkey more often than red meat. Limit fish, poultry, and meat to 6 oz (170 g) each day.  Limit sweets, desserts, sugars, and sugary drinks.  Choose heart-healthy fats.  Eat more home-cooked food and less restaurant, buffet, and fast food.  Limit fried foods.  Do not fry foods. Cook foods using methods such as baking, boiling, grilling, and broiling instead.  When eating at a restaurant, ask that your food be prepared with less salt, or no salt if possible. What foods can I eat? Seek help from a dietitian for individual calorie needs. Grains  Whole grain or whole wheat bread. Brown rice. Whole grain or whole wheat pasta. Quinoa, bulgur, and whole grain cereals. Low-sodium cereals. Corn or whole wheat flour tortillas. Whole grain cornbread. Whole grain crackers. Low-sodium crackers. Vegetables  Fresh or frozen vegetables (raw, steamed, roasted, or  grilled). Low-sodium or reduced-sodium tomato and vegetable juices. Low-sodium or reduced-sodium tomato sauce and paste. Low-sodium or reduced-sodium canned vegetables. Fruits  All fresh, canned (in natural juice), or frozen fruits. Meat and Other Protein Products  Ground beef (85% or leaner), grass-fed beef, or beef trimmed of fat. Skinless chicken or turkey. Ground chicken or turkey. Pork trimmed of fat. All fish and seafood. Eggs. Dried beans, peas, or lentils. Unsalted nuts and seeds. Unsalted canned beans. Dairy  Low-fat dairy products, such as skim or 1% milk, 2% or reduced-fat cheeses, low-fat ricotta or cottage cheese, or plain low-fat yogurt. Low-sodium or reduced-sodium cheeses. Fats and Oils  Tub margarines without trans fats. Light or reduced-fat mayonnaise and salad dressings (reduced sodium). Avocado. Safflower, olive, or canola oils. Natural peanut or almond butter. Other  Unsalted popcorn and pretzels. The items listed above may not be a complete list of recommended foods or beverages. Contact your dietitian for more options.  What foods are not recommended? Grains  White bread. White pasta. White rice. Refined cornbread. Bagels and croissants. Crackers that contain trans fat. Vegetables  Creamed or fried vegetables. Vegetables in a cheese sauce. Regular canned vegetables. Regular canned tomato sauce and paste. Regular tomato and vegetable juices. Fruits  Canned fruit in light or heavy syrup. Fruit juice. Meat and Other Protein Products  Fatty cuts of meat. Ribs, chicken wings, bacon, sausage, bologna, salami, chitterlings, fatback, hot dogs, bratwurst, and packaged luncheon meats. Salted nuts and seeds. Canned beans with salt. Dairy  Whole or 2% milk, cream, half-and-half, and cream cheese. Whole-fat or sweetened yogurt. Full-fat cheeses   or blue cheese. Nondairy creamers and whipped toppings. Processed cheese, cheese spreads, or cheese curds. Condiments  Onion and garlic  salt, seasoned salt, table salt, and sea salt. Canned and packaged gravies. Worcestershire sauce. Tartar sauce. Barbecue sauce. Teriyaki sauce. Soy sauce, including reduced sodium. Steak sauce. Fish sauce. Oyster sauce. Cocktail sauce. Horseradish. Ketchup and mustard. Meat flavorings and tenderizers. Bouillon cubes. Hot sauce. Tabasco sauce. Marinades. Taco seasonings. Relishes. Fats and Oils  Butter, stick margarine, lard, shortening, ghee, and bacon fat. Coconut, palm kernel, or palm oils. Regular salad dressings. Other  Pickles and olives. Salted popcorn and pretzels. The items listed above may not be a complete list of foods and beverages to avoid. Contact your dietitian for more information.  Where can I find more information? National Heart, Lung, and Blood Institute: www.nhlbi.nih.gov/health/health-topics/topics/dash/ This information is not intended to replace advice given to you by your health care provider. Make sure you discuss any questions you have with your health care provider. Document Released: 10/23/2011 Document Revised: 04/10/2016 Document Reviewed: 09/07/2013 Elsevier Interactive Patient Education  2017 [employer] Inc.  

## 2016-12-29 DIAGNOSIS — M5481 Occipital neuralgia: Secondary | ICD-10-CM | POA: Diagnosis not present

## 2016-12-29 DIAGNOSIS — Z862 Personal history of diseases of the blood and blood-forming organs and certain disorders involving the immune mechanism: Secondary | ICD-10-CM | POA: Diagnosis not present

## 2016-12-29 DIAGNOSIS — M48061 Spinal stenosis, lumbar region without neurogenic claudication: Secondary | ICD-10-CM | POA: Diagnosis not present

## 2016-12-29 DIAGNOSIS — D472 Monoclonal gammopathy: Secondary | ICD-10-CM | POA: Diagnosis not present

## 2016-12-29 DIAGNOSIS — R2689 Other abnormalities of gait and mobility: Secondary | ICD-10-CM | POA: Diagnosis not present

## 2017-01-03 IMAGING — CR DG HIP (WITH OR WITHOUT PELVIS) 2-3V*R*
3 series · 3 of 3 positions shown · non-contrast
Comparison: None.

CLINICAL DATA: Pain following fall

EXAM:
DG HIP (WITH OR WITHOUT PELVIS) 2-3V RIGHT

[pelvis ap]
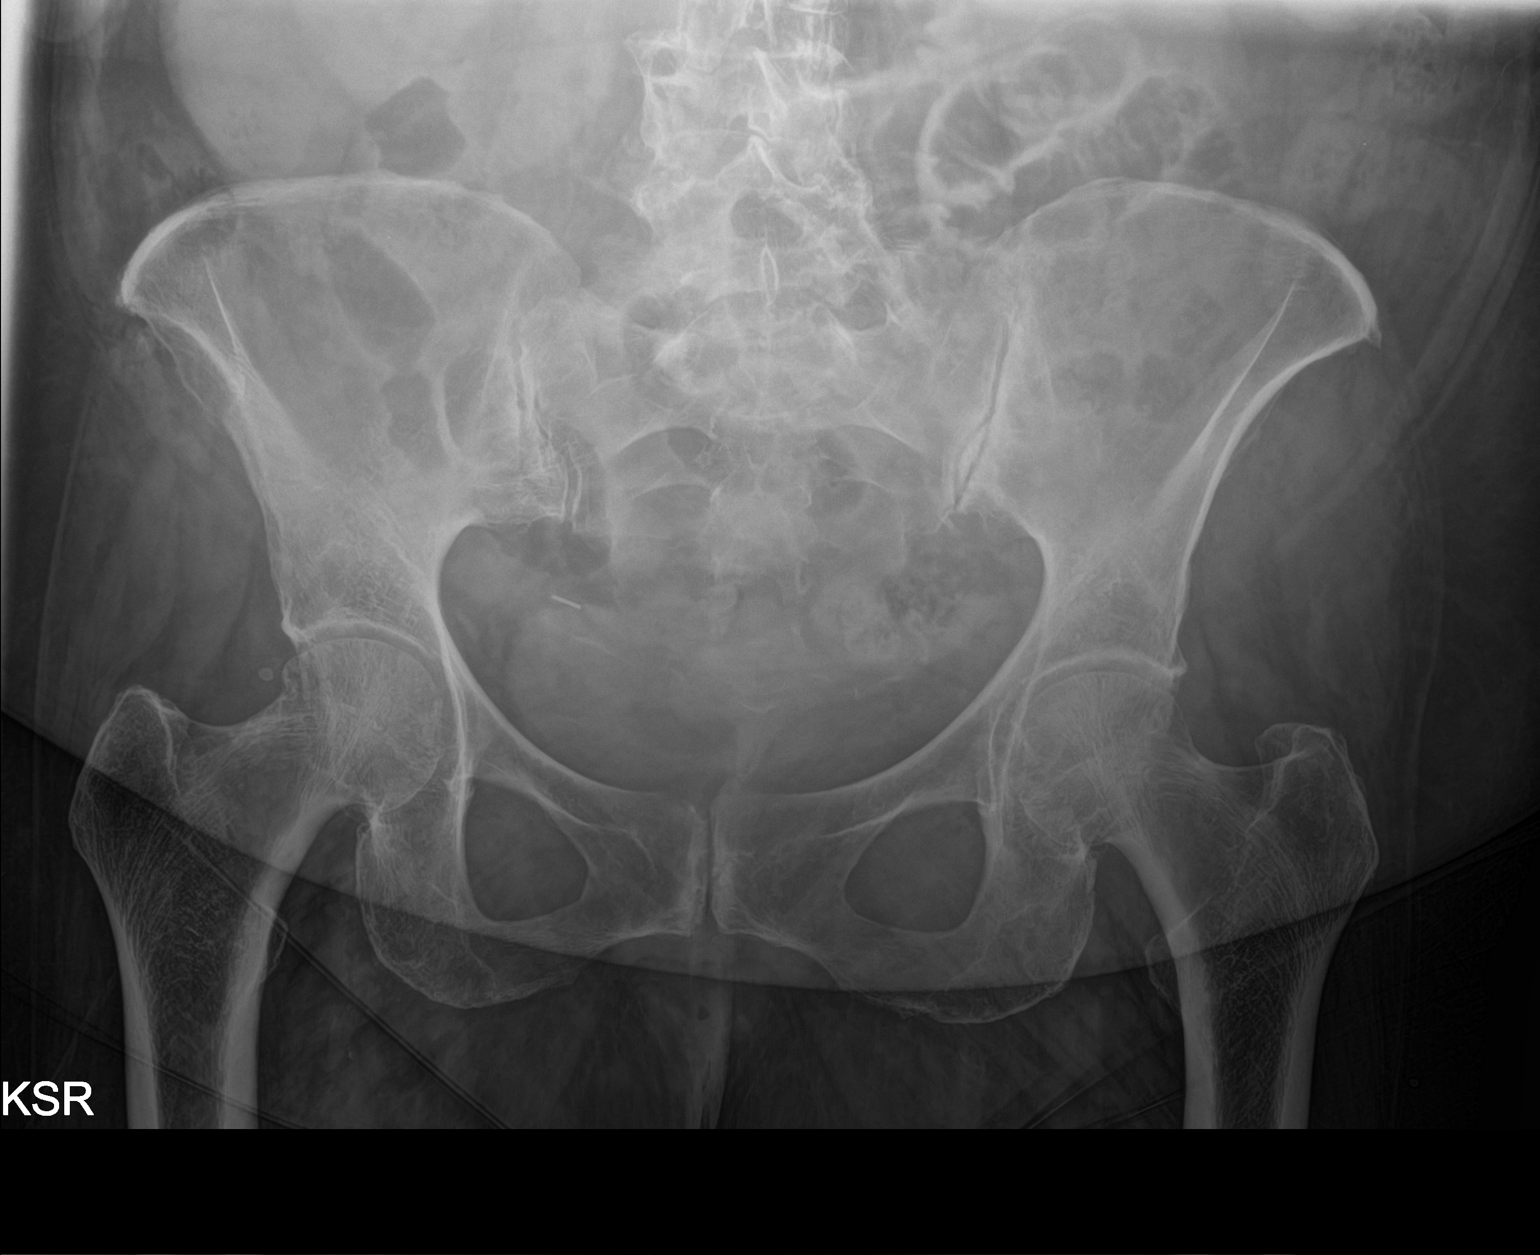

[hip ap]
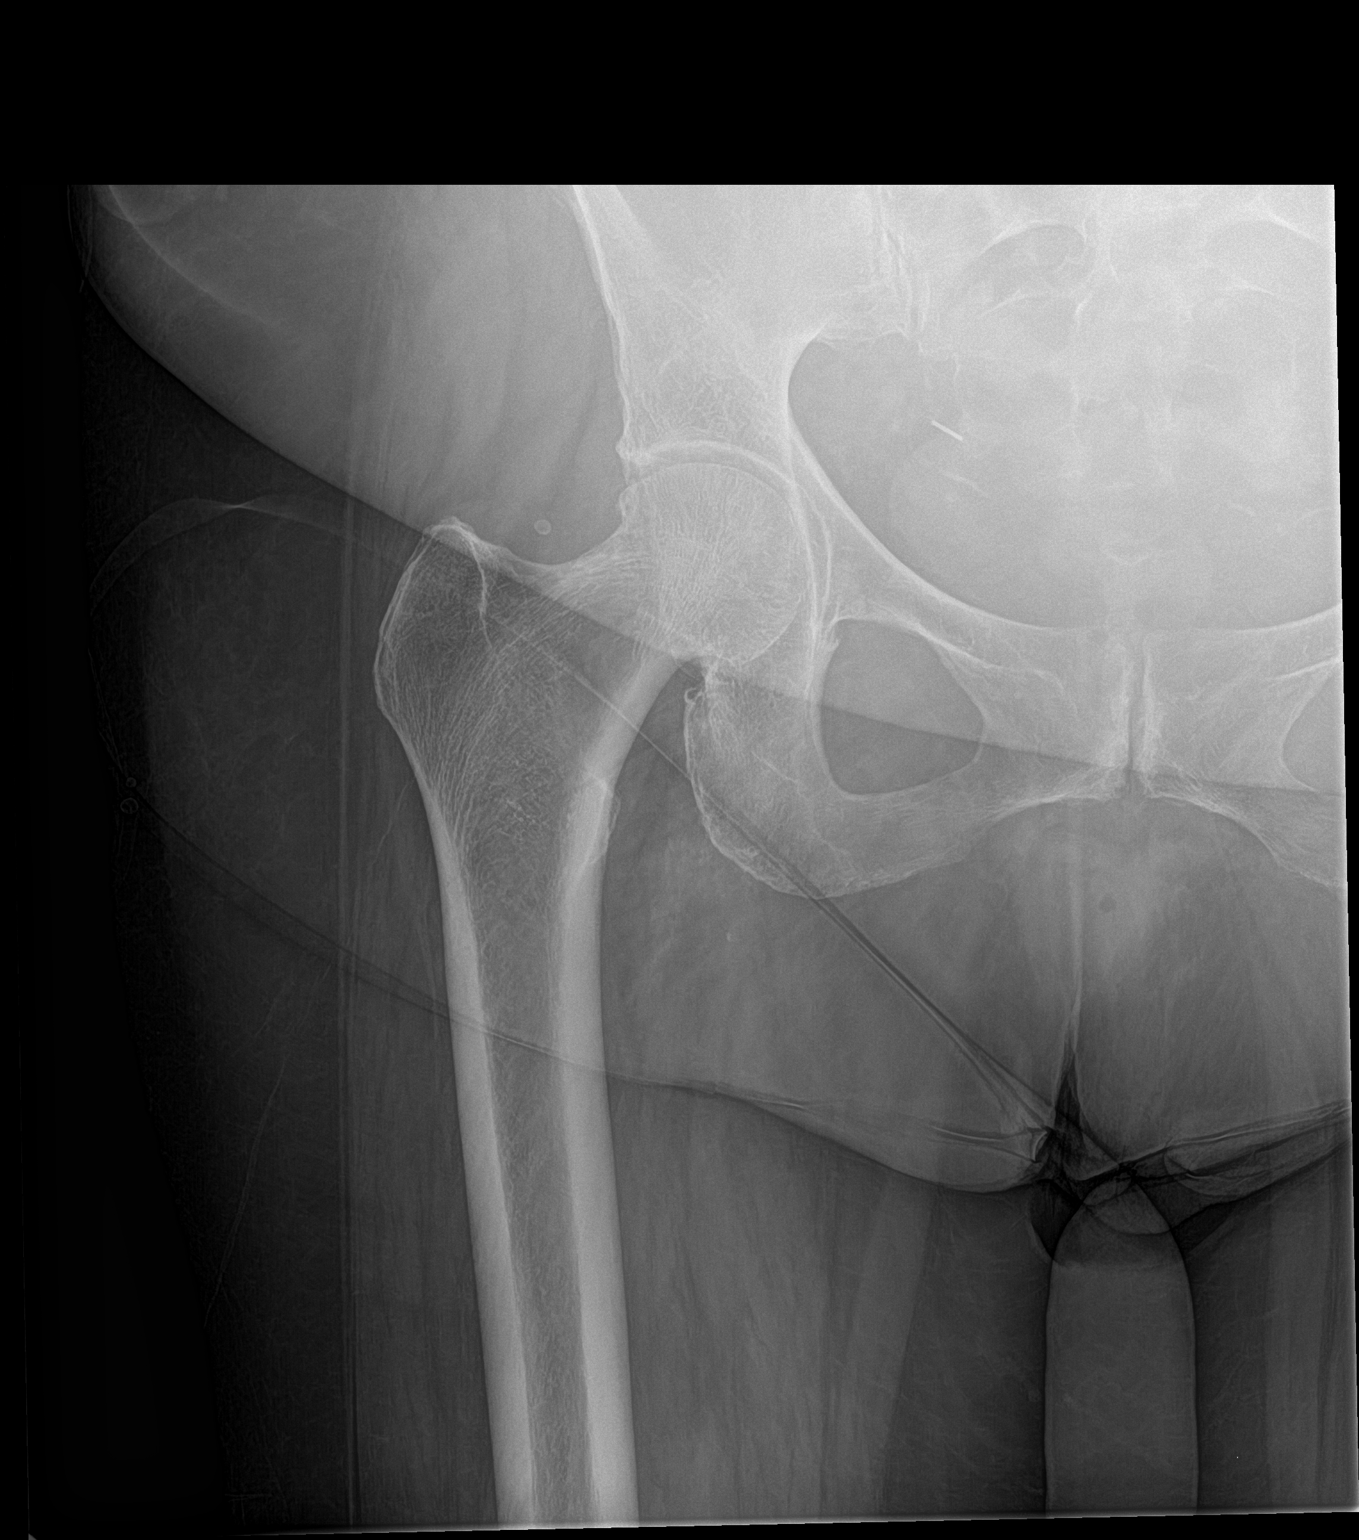

[hip lat]
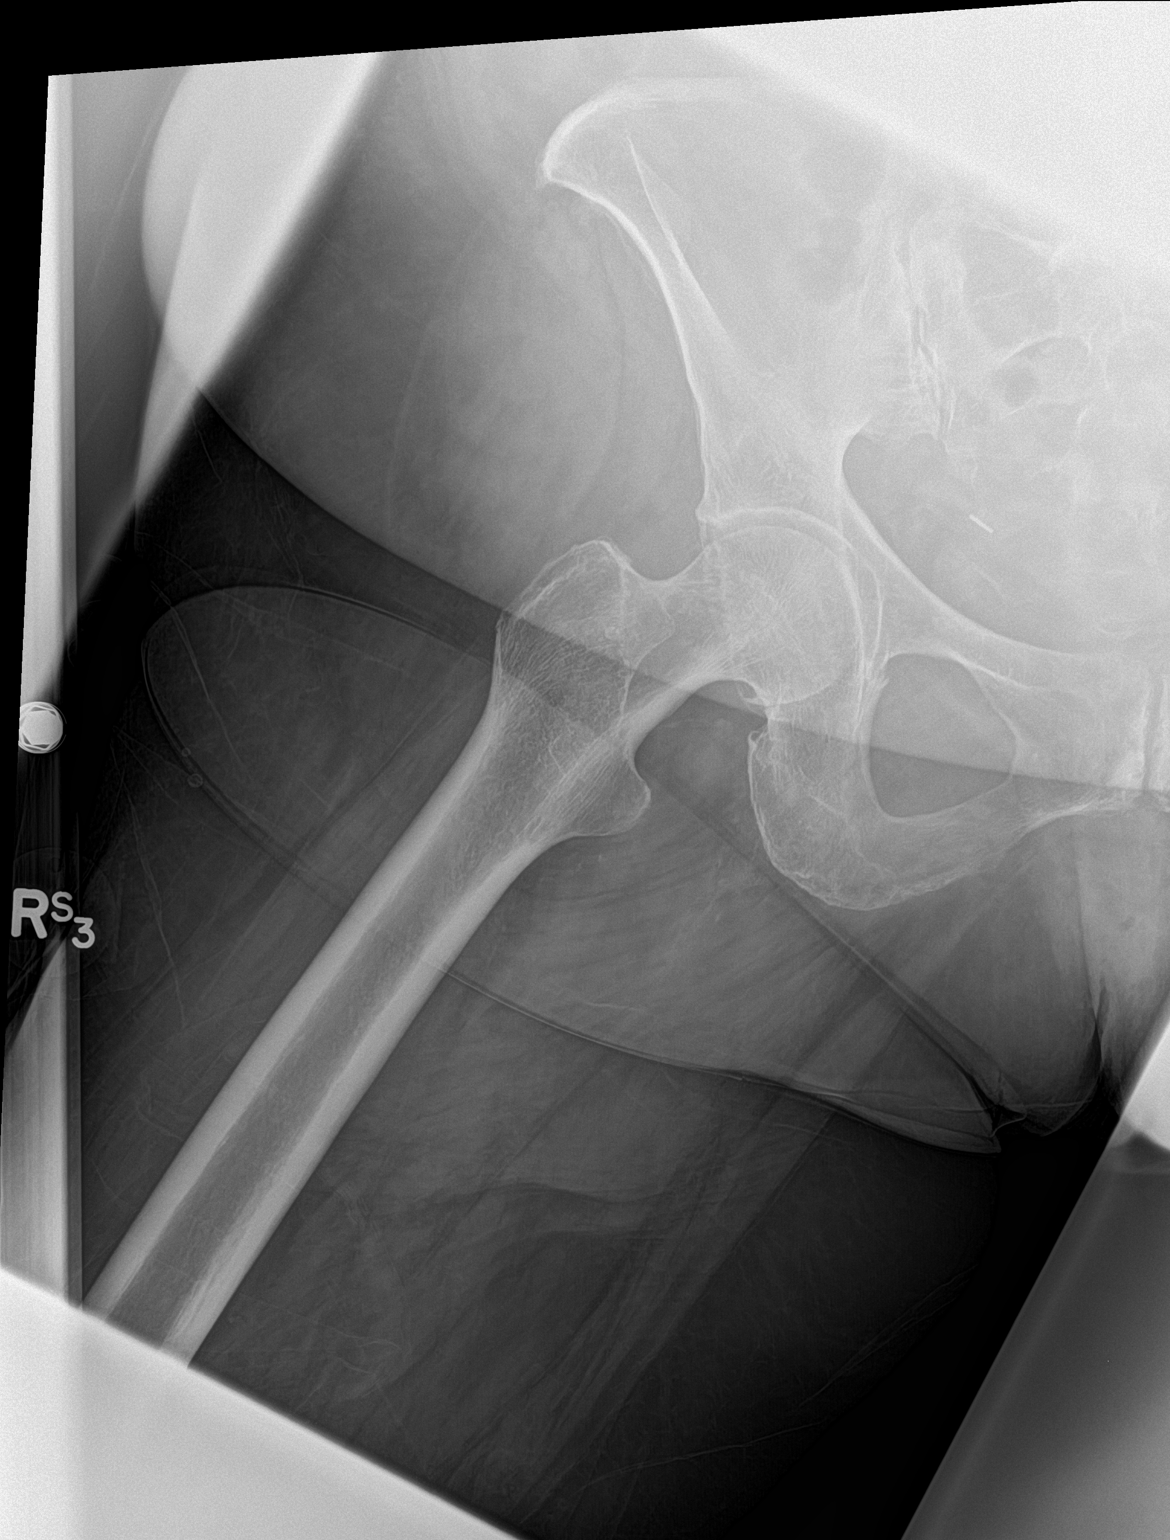

[3 of 3 positions shown; findings below may reference images not displayed]

FINDINGS: Frontal pelvis as well as frontal and lateral right hip images were
obtained. There is no demonstrable fracture or dislocation. There is
slight symmetric narrowing of both hip joints. There is a surgical
clip in the right pelvis. No erosive changes.
IMPRESSION: Slight symmetric narrowing both hip joints. No fracture or
dislocation.

## 2017-01-08 ENCOUNTER — Telehealth: Payer: Self-pay | Admitting: Physician Assistant

## 2017-01-08 DIAGNOSIS — K5900 Constipation, unspecified: Secondary | ICD-10-CM

## 2017-01-08 MED ORDER — POLYETHYLENE GLYCOL 3350 17 GM/SCOOP PO POWD: 17.0000 g | Freq: Two times a day (BID) | ORAL | 1 refills | Status: DC | PRN

## 2017-01-08 NOTE — Telephone Encounter (Signed)
Please review.  Thanks,  -Ranell Finelli 

## 2017-01-08 NOTE — Telephone Encounter (Signed)
Miralax sent in to [facility]. She should start with 2 capfuls daily.

## 2017-01-08 NOTE — Telephone Encounter (Signed)
Patient advised as directed below.  Thanks,  -Joseline 

## 2017-01-08 NOTE — Telephone Encounter (Signed)
Please advise  ED?

## 2017-01-08 NOTE — Telephone Encounter (Signed)
Pt stated that her constipation has gotten worse in the last 2 to 3 weeks. Pt stated that she has been taking 4 stool softeners a day and eating prunes but she is having a BM every other day and she said it is a painful struggle b/c the stool is so hard, large, & compacted. Pt is requesting something to help with constipation sent to [facility]. Please advise. Thanks TNP

## 2017-01-15 DIAGNOSIS — D472 Monoclonal gammopathy: Secondary | ICD-10-CM | POA: Diagnosis not present

## 2017-01-26 ENCOUNTER — Other Ambulatory Visit: Payer: Self-pay | Admitting: Physician Assistant

## 2017-01-26 DIAGNOSIS — M810 Age-related osteoporosis without current pathological fracture: Secondary | ICD-10-CM

## 2017-02-05 DIAGNOSIS — M5481 Occipital neuralgia: Secondary | ICD-10-CM | POA: Diagnosis not present

## 2017-02-20 IMAGING — DX DG KNEE 1-2V PORT*R*
2 series · 2 of 2 positions shown · non-contrast
Comparison: 12/13/2015

CLINICAL DATA: 82-year-old female with a history of right knee
replacement

EXAM:
PORTABLE RIGHT KNEE - 1-2 VIEW

[knee ap]
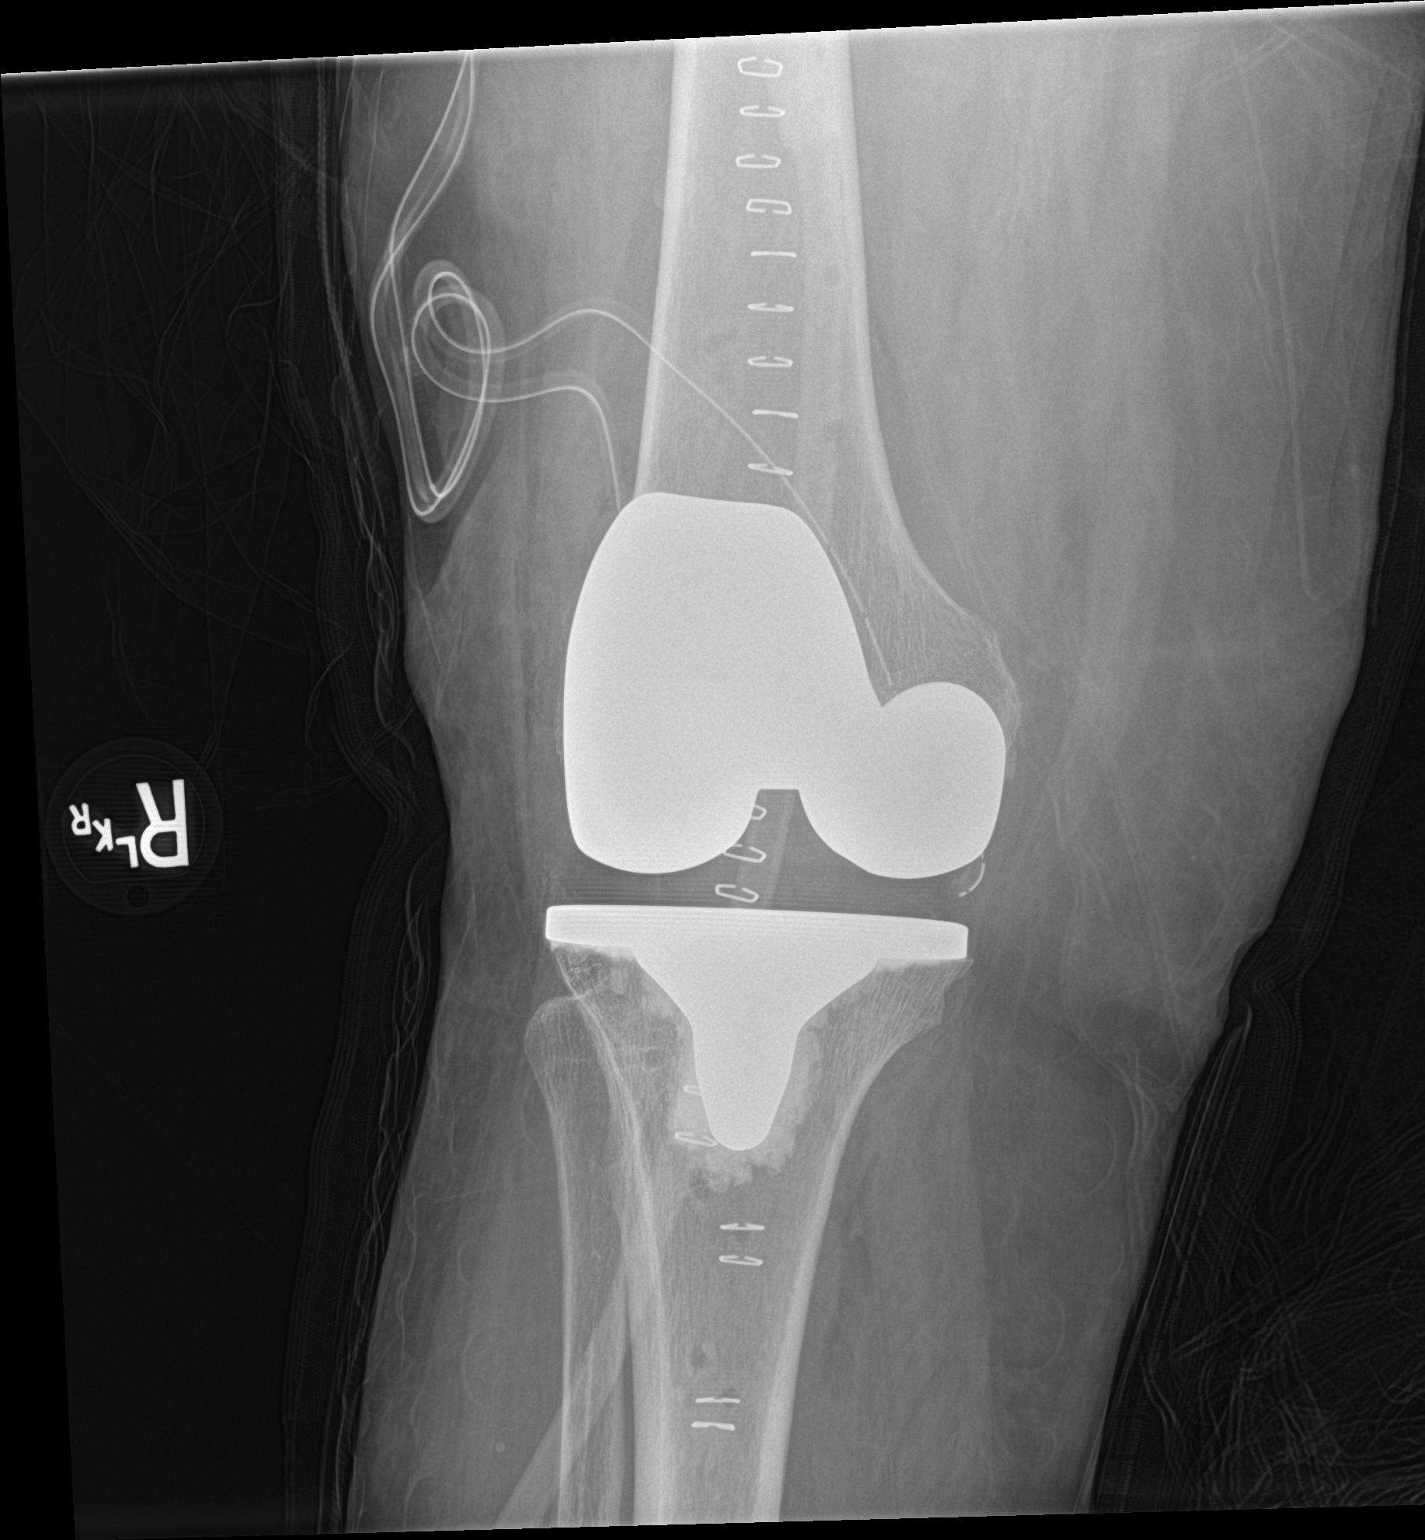

[knee lat]
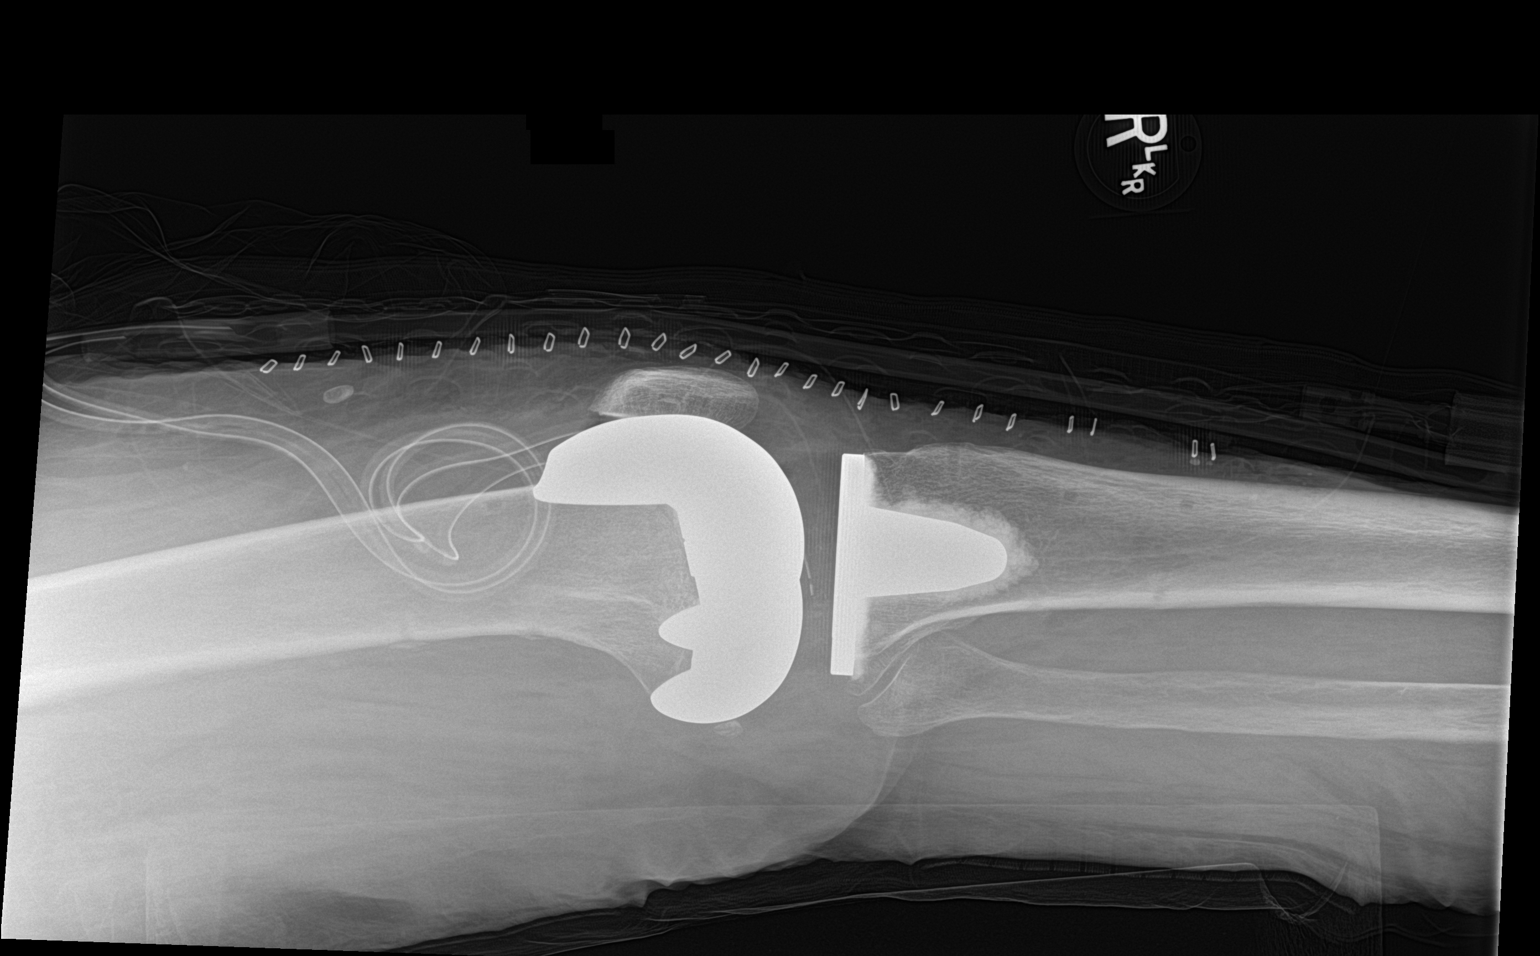

[2 of 2 positions shown; findings below may reference images not displayed]

FINDINGS: Interval surgical changes of right knee arthroplasty, with surgical
drains in place, surgical staples over the midline, soft tissue
swelling.

No complicating features identified.  Alignment maintained.
IMPRESSION: Early postop changes of right knee arthroplasty, with no
complicating features.

## 2017-03-04 DIAGNOSIS — R2689 Other abnormalities of gait and mobility: Secondary | ICD-10-CM | POA: Diagnosis not present

## 2017-03-04 DIAGNOSIS — G4733 Obstructive sleep apnea (adult) (pediatric): Secondary | ICD-10-CM | POA: Diagnosis not present

## 2017-03-04 DIAGNOSIS — M5481 Occipital neuralgia: Secondary | ICD-10-CM | POA: Diagnosis not present

## 2017-03-04 DIAGNOSIS — R42 Dizziness and giddiness: Secondary | ICD-10-CM | POA: Diagnosis not present

## 2017-03-04 DIAGNOSIS — R778 Other specified abnormalities of plasma proteins: Secondary | ICD-10-CM | POA: Diagnosis not present

## 2017-03-04 DIAGNOSIS — R251 Tremor, unspecified: Secondary | ICD-10-CM | POA: Diagnosis not present

## 2017-03-05 ENCOUNTER — Ambulatory Visit (INDEPENDENT_AMBULATORY_CARE_PROVIDER_SITE_OTHER): Payer: Medicare Other | Admitting: Physician Assistant

## 2017-03-05 ENCOUNTER — Encounter: Payer: Self-pay | Admitting: Physician Assistant

## 2017-03-05 VITALS — BP 106/60 | HR 60 | Temp 98.1°F | Resp 16 | Wt 155.2 lb

## 2017-03-05 DIAGNOSIS — J029 Acute pharyngitis, unspecified: Secondary | ICD-10-CM | POA: Diagnosis not present

## 2017-03-05 DIAGNOSIS — R1310 Dysphagia, unspecified: Secondary | ICD-10-CM

## 2017-03-05 MED ORDER — AZITHROMYCIN 250 MG PO TABS: ORAL_TABLET | ORAL | 1 refills | Status: DC

## 2017-03-05 MED ORDER — PREDNISONE 10 MG (21) PO TBPK: ORAL_TABLET | ORAL | 0 refills | Status: DC

## 2017-03-05 NOTE — Patient Instructions (Signed)
Pharyngitis Pharyngitis is redness, pain, and swelling (inflammation) of your pharynx. What are the causes? Pharyngitis is usually caused by infection. Most of the time, these infections are from viruses (viral) and are part of a cold. However, sometimes pharyngitis is caused by bacteria (bacterial). Pharyngitis can also be caused by allergies. Viral pharyngitis may be spread from person to person by coughing, sneezing, and personal items or utensils (cups, forks, spoons, toothbrushes). Bacterial pharyngitis may be spread from person to person by more intimate contact, such as kissing. What are the signs or symptoms? Symptoms of pharyngitis include:  Sore throat.  Tiredness (fatigue).  Low-grade fever.  Headache.  Joint pain and muscle aches.  Skin rashes.  Swollen lymph nodes.  Plaque-like film on throat or tonsils (often seen with bacterial pharyngitis). How is this diagnosed? Your health care provider will ask you questions about your illness and your symptoms. Your medical history, along with a physical exam, is often all that is needed to diagnose pharyngitis. Sometimes, a rapid strep test is done. Other lab tests may also be done, depending on the suspected cause. How is this treated? Viral pharyngitis will usually get better in 3-4 days without the use of medicine. Bacterial pharyngitis is treated with medicines that kill germs (antibiotics). Follow these instructions at home:  Drink enough water and fluids to keep your urine clear or pale yellow.  Only take over-the-counter or prescription medicines as directed by your health care provider:  If you are prescribed antibiotics, make sure you finish them even if you start to feel better.  Do not take aspirin.  Get lots of rest.  Gargle with 8 oz of salt water ( tsp of salt per 1 qt of water) as often as every 1-2 hours to soothe your throat.  Throat lozenges (if you are not at risk for choking) or sprays may be used to  soothe your throat. Contact a health care provider if:  You have large, tender lumps in your neck.  You have a rash.  You cough up green, yellow-brown, or bloody spit. Get help right away if:  Your neck becomes stiff.  You drool or are unable to swallow liquids.  You vomit or are unable to keep medicines or liquids down.  You have severe pain that does not go away with the use of recommended medicines.  You have trouble breathing (not caused by a stuffy nose). This information is not intended to replace advice given to you by your health care provider. Make sure you discuss any questions you have with your health care provider. Document Released: 11/03/2005 Document Revised: 04/10/2016 Document Reviewed: 07/11/2013 Elsevier Interactive Patient Education  2017 Elsevier Inc[employer]  

## 2017-03-05 NOTE — Progress Notes (Signed)
Patient: Helen Ramsey Female    DOB: 19-Aug-1933   83 y.o.   MRN: [id number] Visit Date: 03/05/2017  Today's Provider: Mar Daring, PA-C   Chief Complaint  Patient presents with  . Sore Throat   Subjective:    HPI Patient here C/O sore throat, nasal congestion and headache x's a few weeks. Patient reports that about 2 weeks ago she had some dental work done and thought it was related to that. Patient reports that she is able to swallow soft foods and drinking well. Patient denies fever, cough or shortness of breath. Patient reports she takes Zyrtec daily and cough syrup as needed.     Allergies  Allergen Reactions  . Amoxicillin   . Clindamycin/Lincomycin   . Doxycycline   . Levaquin [Levofloxacin In D5w]   . Mirabegron Nausea And Vomiting  . Nsaids Other (See Comments)    GI upset  . Oxycodone Nausea Only    Other reaction(s): Hallucination  . Phenergan [Promethazine Hcl]   . Singulair [Montelukast]   . Vicodin [Hydrocodone-Acetaminophen]   . Zocor [Simvastatin]   . Zoloft  [Sertraline] Other (See Comments)  . Ampicillin Rash  . Bactrim [Sulfamethoxazole-Trimethoprim] Rash  . Penicillins Rash    Ampicillin, Clindamycin, Doxycycline Levaquin-severe headache, muscle and joint aches, weak     Current Outpatient Prescriptions:  .  acetaminophen (TYLENOL) 500 MG tablet, Take 500 mg by mouth 3 (three) times daily. , Disp: , Rfl:  .  alendronate (FOSAMAX) 70 MG tablet, TAKE ONE TABLET BY MOUTH EVERY WEEK AS DIRECTED, Disp: 12 tablet, Rfl: 1 .  atorvastatin (LIPITOR) 20 MG tablet, Take 1 tablet (20 mg total) by mouth daily at 6 PM., Disp: 90 tablet, Rfl: 3 .  azelastine (ASTELIN) 0.1 % nasal spray, Place 1 spray into both nostrils as needed for rhinitis. Use in each nostril as directed, Disp: 30 mL, Rfl: 1 .  calcium carbonate (TUMS - DOSED IN MG ELEMENTAL CALCIUM) 500 MG chewable tablet, Chew 1 tablet by mouth as needed. , Disp: , Rfl:  .  Calcium  Carbonate-Vitamin D 600-400 MG-UNIT tablet, Take 1 tablet by mouth 2 (two) times daily. (Patient taking differently: Take 1 tablet by mouth daily with supper. ), Disp: 180 tablet, Rfl: 1 .  cholecalciferol (VITAMIN D) 1000 units tablet, Take 1 tablet (1,000 Units total) by mouth daily., Disp: 1 tablet, Rfl: 0 .  citalopram (CELEXA) 40 MG tablet, Take 1 tablet (40 mg total) by mouth daily., Disp: 90 tablet, Rfl: 3 .  docusate sodium (COLACE) 100 MG capsule, Take 100 mg by mouth as needed for mild constipation., Disp: , Rfl:  .  hydrochlorothiazide (HYDRODIURIL) 25 MG tablet, Take 1 tablet (25 mg total) by mouth daily. (Patient taking differently: Take 12.5 mg by mouth daily. 1/2 tablet), Disp: 90 tablet, Rfl: 3 .  lisinopril (PRINIVIL,ZESTRIL) 5 MG tablet, Take 1 tablet (5 mg total) by mouth daily., Disp: 30 tablet, Rfl: 1 .  Magnesium Oxide, Antacid, 500 MG CAPS, Take 1 tablet by mouth as needed., Disp: , Rfl:  .  Melatonin 1 MG TABS, Take 1 tablet by mouth as needed. , Disp: , Rfl:  .  Omega-3 Fatty Acids (FISH OIL) 1200 MG CAPS, Take 2 capsules by mouth daily. , Disp: , Rfl:  .  phenazopyridine (AZO-TABS) 95 MG tablet, Take 1 tablet (95 mg total) by mouth as needed., Disp: 30 tablet, Rfl: 3 .  polyethylene glycol powder (GLYCOLAX/MIRALAX) powder, Take 17 g  by mouth 2 (two) times daily as needed., Disp: 3350 g, Rfl: 1 .  POTASSIUM GLUCONATE PO, Take 1 tablet by mouth daily with supper., Disp: , Rfl:  .  ranitidine (ZANTAC) 150 MG tablet, Take 1 tablet (150 mg total) by mouth 2 (two) times daily., Disp: 180 tablet, Rfl: 1 .  Simethicone (GAS RELIEF PO), Take by mouth as needed. , Disp: , Rfl:  .  sodium chloride (OCEAN) 0.65 % nasal spray, Reported on 01/30/2016, Disp: , Rfl:  .  traMADol (ULTRAM) 50 MG tablet, Take 1 tablet (50 mg total) by mouth 2 (two) times daily as needed., Disp: 180 tablet, Rfl: 1 .  azithromycin (ZITHROMAX) 250 MG tablet, Take 2 tablets prior to procedures, Disp: , Rfl:    Review of Systems  Constitutional: Negative.   HENT: Positive for congestion, postnasal drip, rhinorrhea, sinus pressure, sore throat and trouble swallowing. Negative for ear pain, facial swelling, hearing loss, sinus pain, sneezing and tinnitus.   Respiratory: Negative.   Cardiovascular: Negative.   Gastrointestinal: Negative for abdominal pain.  Neurological: Positive for dizziness and headaches.    Social History  Substance Use Topics  . Smoking status: Never Smoker  . Smokeless tobacco: Never Used  . Alcohol use No   Objective:   BP 106/60 (BP Location: Left Arm, Patient Position: Sitting, Cuff Size: Large)   Pulse 60   Temp 98.1 F (36.7 C) (Oral)   Resp 16   Wt 155 lb 3.2 oz (70.4 kg)   SpO2 97%   BMI 30.31 kg/m  Vitals:   03/05/17 0836  BP: 106/60  Pulse: 60  Resp: 16  Temp: 98.1 F (36.7 C)  TempSrc: Oral  SpO2: 97%  Weight: 155 lb 3.2 oz (70.4 kg)     Physical Exam  Constitutional: She appears well-developed and well-nourished. No distress.  HENT:  Head: Normocephalic and atraumatic.  Right Ear: Hearing, external ear and ear canal normal. Tympanic membrane is not erythematous and not bulging. A middle ear effusion is present.  Left Ear: Hearing, external ear and ear canal normal. Tympanic membrane is not erythematous and not bulging. A middle ear effusion is present.  Nose: Mucosal edema and rhinorrhea present. Right sinus exhibits no maxillary sinus tenderness and no frontal sinus tenderness. Left sinus exhibits no maxillary sinus tenderness and no frontal sinus tenderness.  Mouth/Throat: Uvula is midline and mucous membranes are normal. Posterior oropharyngeal edema and posterior oropharyngeal erythema present. No oropharyngeal exudate.  Eyes: Conjunctivae are normal. Pupils are equal, round, and reactive to light. Right eye exhibits no discharge. Left eye exhibits no discharge. No scleral icterus.  Neck: Normal range of motion. Neck supple. No tracheal  deviation present. No thyromegaly present.  Cardiovascular: Normal rate, regular rhythm and normal heart sounds.  Exam reveals no gallop and no friction rub.   No murmur heard. Pulmonary/Chest: Effort normal and breath sounds normal. No stridor. No respiratory distress. She has no wheezes. She has no rales.  Lymphadenopathy:    She has no cervical adenopathy.  Skin: Skin is warm and dry. She is not diaphoretic.  Vitals reviewed.      Assessment & Plan:     1. Pharyngitis, unspecified etiology Will treat with zpak due to multiple antibiotic intolerances and see if she gets improvement. Prednisone given for inflammation. Salt water gargles encouraged. Chloraseptic spray for pain. She is to call if symptoms or swallowing worsens. - predniSONE (STERAPRED UNI-PAK 21 TAB) 10 MG (21) TBPK tablet; Take as directed on package  instructions  Dispense: 21 tablet; Refill: 0 - azithromycin (ZITHROMAX) 250 MG tablet; Take 2 tablets PO on day one, and one tablet PO daily thereafter until completed.  Dispense: 6 tablet; Refill: 1  2. Dysphagia, unspecified type See above medical treatment plan. - predniSONE (STERAPRED UNI-PAK 21 TAB) 10 MG (21) TBPK tablet; Take as directed on package instructions  Dispense: 21 tablet; Refill: 0       Mar Daring, PA-C  [facility]

## 2017-03-16 DIAGNOSIS — R2681 Unsteadiness on feet: Secondary | ICD-10-CM | POA: Diagnosis not present

## 2017-03-16 DIAGNOSIS — R296 Repeated falls: Secondary | ICD-10-CM | POA: Diagnosis not present

## 2017-03-16 DIAGNOSIS — R293 Abnormal posture: Secondary | ICD-10-CM | POA: Diagnosis not present

## 2017-03-25 DIAGNOSIS — R296 Repeated falls: Secondary | ICD-10-CM | POA: Diagnosis not present

## 2017-03-25 DIAGNOSIS — R293 Abnormal posture: Secondary | ICD-10-CM | POA: Diagnosis not present

## 2017-03-25 DIAGNOSIS — R2681 Unsteadiness on feet: Secondary | ICD-10-CM | POA: Diagnosis not present

## 2017-03-26 ENCOUNTER — Inpatient Hospital Stay: Payer: Medicare Other | Attending: Oncology | Admitting: Oncology

## 2017-03-26 ENCOUNTER — Encounter: Payer: Self-pay | Admitting: Oncology

## 2017-03-26 ENCOUNTER — Inpatient Hospital Stay: Payer: Medicare Other

## 2017-03-26 VITALS — BP 127/65 | HR 66 | Temp 98.2°F | Wt 156.2 lb

## 2017-03-26 DIAGNOSIS — G4730 Sleep apnea, unspecified: Secondary | ICD-10-CM | POA: Diagnosis not present

## 2017-03-26 DIAGNOSIS — J3489 Other specified disorders of nose and nasal sinuses: Secondary | ICD-10-CM | POA: Diagnosis not present

## 2017-03-26 DIAGNOSIS — K219 Gastro-esophageal reflux disease without esophagitis: Secondary | ICD-10-CM

## 2017-03-26 DIAGNOSIS — H9209 Otalgia, unspecified ear: Secondary | ICD-10-CM | POA: Diagnosis not present

## 2017-03-26 DIAGNOSIS — J029 Acute pharyngitis, unspecified: Secondary | ICD-10-CM | POA: Diagnosis not present

## 2017-03-26 DIAGNOSIS — R5383 Other fatigue: Secondary | ICD-10-CM | POA: Insufficient documentation

## 2017-03-26 DIAGNOSIS — R531 Weakness: Secondary | ICD-10-CM | POA: Diagnosis not present

## 2017-03-26 DIAGNOSIS — I10 Essential (primary) hypertension: Secondary | ICD-10-CM | POA: Insufficient documentation

## 2017-03-26 DIAGNOSIS — M5481 Occipital neuralgia: Secondary | ICD-10-CM | POA: Diagnosis not present

## 2017-03-26 DIAGNOSIS — E559 Vitamin D deficiency, unspecified: Secondary | ICD-10-CM | POA: Diagnosis not present

## 2017-03-26 DIAGNOSIS — M4800 Spinal stenosis, site unspecified: Secondary | ICD-10-CM | POA: Diagnosis not present

## 2017-03-26 DIAGNOSIS — R42 Dizziness and giddiness: Secondary | ICD-10-CM | POA: Insufficient documentation

## 2017-03-26 DIAGNOSIS — M25562 Pain in left knee: Secondary | ICD-10-CM | POA: Insufficient documentation

## 2017-03-26 DIAGNOSIS — D472 Monoclonal gammopathy: Secondary | ICD-10-CM

## 2017-03-26 DIAGNOSIS — G2581 Restless legs syndrome: Secondary | ICD-10-CM | POA: Diagnosis not present

## 2017-03-26 DIAGNOSIS — K259 Gastric ulcer, unspecified as acute or chronic, without hemorrhage or perforation: Secondary | ICD-10-CM | POA: Diagnosis not present

## 2017-03-26 DIAGNOSIS — Z79899 Other long term (current) drug therapy: Secondary | ICD-10-CM | POA: Insufficient documentation

## 2017-03-26 DIAGNOSIS — M549 Dorsalgia, unspecified: Secondary | ICD-10-CM | POA: Insufficient documentation

## 2017-03-26 DIAGNOSIS — F419 Anxiety disorder, unspecified: Secondary | ICD-10-CM

## 2017-03-26 DIAGNOSIS — Z8673 Personal history of transient ischemic attack (TIA), and cerebral infarction without residual deficits: Secondary | ICD-10-CM | POA: Insufficient documentation

## 2017-03-26 DIAGNOSIS — M25561 Pain in right knee: Secondary | ICD-10-CM

## 2017-03-26 DIAGNOSIS — R51 Headache: Secondary | ICD-10-CM

## 2017-03-26 DIAGNOSIS — R011 Cardiac murmur, unspecified: Secondary | ICD-10-CM

## 2017-03-26 DIAGNOSIS — F329 Major depressive disorder, single episode, unspecified: Secondary | ICD-10-CM | POA: Diagnosis not present

## 2017-03-26 DIAGNOSIS — D649 Anemia, unspecified: Secondary | ICD-10-CM | POA: Insufficient documentation

## 2017-03-26 DIAGNOSIS — Z86010 Personal history of colonic polyps: Secondary | ICD-10-CM | POA: Insufficient documentation

## 2017-03-26 DIAGNOSIS — Z8719 Personal history of other diseases of the digestive system: Secondary | ICD-10-CM | POA: Insufficient documentation

## 2017-03-26 DIAGNOSIS — M5136 Other intervertebral disc degeneration, lumbar region: Secondary | ICD-10-CM | POA: Insufficient documentation

## 2017-03-26 LAB — COMPREHENSIVE METABOLIC PANEL
ALT: 27 U/L (ref 14–54)
AST: 24 U/L (ref 15–41)
Albumin: 3.9 g/dL (ref 3.5–5.0)
Alkaline Phosphatase: 58 U/L (ref 38–126)
Anion gap: 6 (ref 5–15)
BILIRUBIN TOTAL: 0.6 mg/dL (ref 0.3–1.2)
BUN: 20 mg/dL (ref 6–20)
CO2: 29 mmol/L (ref 22–32)
CREATININE: 0.78 mg/dL (ref 0.44–1.00)
Calcium: 9.6 mg/dL (ref 8.9–10.3)
Chloride: 99 mmol/L — ABNORMAL LOW (ref 101–111)
GFR calc Af Amer: >60 mL/min (ref >60)
Glucose, Bld: 90 mg/dL (ref 65–99)
Potassium: 4.1 mmol/L (ref 3.5–5.1)
Sodium: 134 mmol/L — ABNORMAL LOW (ref 135–145)
TOTAL PROTEIN: 7.3 g/dL (ref 6.5–8.1)

## 2017-03-26 LAB — CBC
HEMATOCRIT: 38.4 % (ref 35.0–47.0)
Hemoglobin: 13.3 g/dL (ref 12.0–16.0)
MCH: 30.5 pg (ref 26.0–34.0)
MCHC: 34.5 g/dL (ref 32.0–36.0)
MCV: 88.4 fL (ref 80.0–100.0)
Platelets: 195 10*3/uL (ref 150–440)
RBC: 4.34 MIL/uL (ref 3.80–5.20)
RDW: 14.8 % — AB (ref 11.5–14.5)
WBC: 4.9 10*3/uL (ref 3.6–11.0)

## 2017-03-26 NOTE — Progress Notes (Signed)
Patient here for initial visit. She has chronic headaches and is being seen by a neurologist.

## 2017-03-26 NOTE — Progress Notes (Deleted)
Hematology/Oncology Consult note [facility]  [phone] [phone]  Patient Care Team: Rubye Beach as PCP - General (Family Medicine)   Name of the patient: Helen Ramsey  [id number]  1933/04/29   Date of visit: 03/26/17  Diagnosis- ***  Chief complaint/ Reason for visit- ***  Heme/Onc history: ***  Interval history- ***  ECOG PS- *** Pain scale- *** Opioid associated constipation- ***  Review of systems- ROS   Current treatment- ***  Allergies  Allergen Reactions  . Amoxicillin   . Clindamycin/Lincomycin   . Doxycycline   . Levaquin [Levofloxacin In D5w]   . Mirabegron Nausea And Vomiting  . Nsaids Other (See Comments)    GI upset  . Oxycodone Nausea Only    Other reaction(s): Hallucination  . Phenergan [Promethazine Hcl]   . Singulair [Montelukast]   . Vicodin [Hydrocodone-Acetaminophen]   . Zocor [Simvastatin]   . Zoloft  [Sertraline] Other (See Comments)  . Ampicillin Rash  . Bactrim [Sulfamethoxazole-Trimethoprim] Rash  . Penicillins Rash    Ampicillin, Clindamycin, Doxycycline Levaquin-severe headache, muscle and joint aches, weak     Past Medical History:  Diagnosis Date  . Allergic rhinitis, cause unspecified   . Allergic rhinitis, cause unspecified   . Atherosclerosis of aorta ([place])   . Back pain    lumbar  . Degeneration of lumbar or lumbosacral intervertebral disc   . Diverticulitis of colon (without mention of hemorrhage)(562.11)   . Dizziness and giddiness   . Dysthymic disorder   . Headache(784.0)   . Myalgia and myositis, unspecified   . Osteoporosis, unspecified   . Other and unspecified hyperlipidemia   . Other diseases of nasal cavity and sinuses(478.19)   . Other specified disorders of bladder   . Syncope   . Trigger finger (acquired)   . Unspecified essential hypertension   . Unspecified sleep apnea   . Urinary frequency      Past Surgical History:    Procedure Laterality Date  . ABDOMINAL HYSTERECTOMY    . ADENOIDECTOMY    . APPENDECTOMY    . ARTHROSCOPIC KNEE SURGERY    . CARDIAC CATHETERIZATION     [facility]  . CARPAL TUNNEL RELEASE    . CATARACT EXTRACTION W/ INTRAOCULAR LENS IMPLANT & ANTERIOR VITRECTOMY, BILATERAL    . CHOLECYSTECTOMY    . KNEE ARTHROPLASTY Right 01/30/2016   Procedure: COMPUTER ASSISTED TOTAL KNEE ARTHROPLASTY;  Surgeon: Dereck Leep, MD;  Location: [facility] ORS;  Service: Orthopedics;  Laterality: Right;  . R/O Lymph Nodes; Right Axilla    . ROTATOR CUFF REPAIR Right   . SPG block  03/20/2016   sphenopalatine ganglion and maxillary division of trigeminal nerve block block Dr. Manuella Ghazi  . TEMPORAL ARTERY BIOPSY / LIGATION    . TONSILLECTOMY    . TUBAL LIGATION      Social History   Social History  . Marital status: Widowed    Spouse name: N/A  . Number of children: N/A  . Years of education: N/A   Occupational History  . Not on file.   Social History Main Topics  . Smoking status: Never Smoker  . Smokeless tobacco: Never Used  . Alcohol use No  . Drug use: No  . Sexual activity: Not on file   Other Topics Concern  . Not on file   Social History Narrative  . No narrative on file    Family History  Problem Relation Age of Onset  . Heart disease  Mother   . Depression Mother   . Colon polyps Mother   . Dementia Mother   . Heart disease Father   . Dementia Father   . Heart disease Brother   . Hypertension Brother   . Heart disease Brother   . Hypertension Brother   . Kidney cancer Neg Hx   . Bladder Cancer Neg Hx      Current Outpatient Prescriptions:  .  acetaminophen (TYLENOL) 500 MG tablet, Take 500 mg by mouth 3 (three) times daily. , Disp: , Rfl:  .  alendronate (FOSAMAX) 70 MG tablet, TAKE ONE TABLET BY MOUTH EVERY WEEK AS DIRECTED, Disp: 12 tablet, Rfl: 1 .  atorvastatin (LIPITOR) 20 MG tablet, Take 1 tablet (20 mg total) by mouth daily at 6 PM., Disp: 90 tablet, Rfl: 3 .  azelastine  (ASTELIN) 0.1 % nasal spray, Place 1 spray into both nostrils as needed for rhinitis. Use in each nostril as directed, Disp: 30 mL, Rfl: 1 .  azithromycin (ZITHROMAX) 250 MG tablet, Take 2 tablets prior to procedures, Disp: , Rfl:  .  azithromycin (ZITHROMAX) 250 MG tablet, Take 2 tablets PO on day one, and one tablet PO daily thereafter until completed., Disp: 6 tablet, Rfl: 1 .  calcium carbonate (TUMS - DOSED IN MG ELEMENTAL CALCIUM) 500 MG chewable tablet, Chew 1 tablet by mouth as needed. , Disp: , Rfl:  .  Calcium Carbonate-Vitamin D 600-400 MG-UNIT tablet, Take 1 tablet by mouth 2 (two) times daily. (Patient taking differently: Take 1 tablet by mouth daily with supper. ), Disp: 180 tablet, Rfl: 1 .  cholecalciferol (VITAMIN D) 1000 units tablet, Take 1 tablet (1,000 Units total) by mouth daily., Disp: 1 tablet, Rfl: 0 .  citalopram (CELEXA) 40 MG tablet, Take 1 tablet (40 mg total) by mouth daily., Disp: 90 tablet, Rfl: 3 .  docusate sodium (COLACE) 100 MG capsule, Take 100 mg by mouth as needed for mild constipation., Disp: , Rfl:  .  hydrochlorothiazide (HYDRODIURIL) 25 MG tablet, Take 1 tablet (25 mg total) by mouth daily. (Patient taking differently: Take 12.5 mg by mouth daily. 1/2 tablet), Disp: 90 tablet, Rfl: 3 .  lisinopril (PRINIVIL,ZESTRIL) 5 MG tablet, Take 1 tablet (5 mg total) by mouth daily., Disp: 30 tablet, Rfl: 1 .  Magnesium Oxide, Antacid, 500 MG CAPS, Take 1 tablet by mouth as needed., Disp: , Rfl:  .  Melatonin 1 MG TABS, Take 1 tablet by mouth as needed. , Disp: , Rfl:  .  Omega-3 Fatty Acids (FISH OIL) 1200 MG CAPS, Take 2 capsules by mouth daily. , Disp: , Rfl:  .  phenazopyridine (AZO-TABS) 95 MG tablet, Take 1 tablet (95 mg total) by mouth as needed., Disp: 30 tablet, Rfl: 3 .  polyethylene glycol powder (GLYCOLAX/MIRALAX) powder, Take 17 g by mouth 2 (two) times daily as needed., Disp: 3350 g, Rfl: 1 .  POTASSIUM GLUCONATE PO, Take 1 tablet by mouth daily with  supper., Disp: , Rfl:  .  predniSONE (STERAPRED UNI-PAK 21 TAB) 10 MG (21) TBPK tablet, Take as directed on package instructions, Disp: 21 tablet, Rfl: 0 .  ranitidine (ZANTAC) 150 MG tablet, Take 1 tablet (150 mg total) by mouth 2 (two) times daily., Disp: 180 tablet, Rfl: 1 .  Simethicone (GAS RELIEF PO), Take by mouth as needed. , Disp: , Rfl:  .  sodium chloride (OCEAN) 0.65 % nasal spray, Reported on 01/30/2016, Disp: , Rfl:  .  traMADol (ULTRAM) 50 MG tablet, Take  1 tablet (50 mg total) by mouth 2 (two) times daily as needed., Disp: 180 tablet, Rfl: 1  Physical exam: There were no vitals filed for this visit. Physical Exam   CMP Latest Ref Rng & Units 07/23/2016  Glucose 65 - 99 mg/dL 86  BUN 8 - 27 mg/dL 17  Creatinine 0.57 - 1.00 mg/dL 0.71  Sodium 134 - 144 mmol/L 141  Potassium 3.5 - 5.2 mmol/L 4.5  Chloride 96 - 106 mmol/L 98  CO2 18 - 29 mmol/L 25  Calcium 8.7 - 10.3 mg/dL 9.5  Total Protein 6.0 - 8.5 g/dL 6.5  Total Bilirubin 0.0 - 1.2 mg/dL 0.4  Alkaline Phos 39 - 117 IU/L 53  AST 0 - 40 IU/L 21  ALT 0 - 32 IU/L 21   CBC Latest Ref Rng & Units 07/23/2016  WBC 3.4 - 10.8 x10E3/uL 7.1  Hemoglobin 12.0 - 16.0 g/dL -  Hematocrit 34.0 - 46.6 % 38.3  Platelets 150 - 379 x10E3/uL 245    No images are attached to the encounter.  No results found.   Assessment and plan- Patient is a 83 y.o. female ***   Visit Diagnosis No diagnosis found.   Dr. Randa Evens, MD, MPH [place] [facility] Pager- [phone] 03/26/2017 8:47 AM

## 2017-03-26 NOTE — Progress Notes (Signed)
Hematology/Oncology Consult note [facility] [phone] [phone]  Patient Care Team: Rubye Beach as PCP - General (Family Medicine)   Name of the patient: Helen Ramsey  [id number]  [birth year]    Reason for referral- abnormal spep   Referring physician- Dr. Jennings Books  Date of visit: 03/26/17   History of presenting illness- Patient is a 83 year old female who was seen by Dr. Jennings Books from urology for symptoms of occipital neuralgia as well as dizziness. As a part of the workup patient had blood work done which showed abnormal paraprotein in her serum and hence has been referred to [place]. The patient is already been due hematology who repeated a workup for her SPEP as mentioned below. They also repeat skeletal survey which showed: 1.Mildly expansile rounded lucency within the mid C2 vertebral body on lateral view is favored to represent a normal lateral mass en face. A lytic lesion is felt less likely, however cannot be excluded. 2.Transitional anatomy at the lumbosacral junction with 6 nonrib-bearing bearing lumbar type vertebral bodies. For purposes of this exam the most inferior will be labeled as L6. There is of the L6 vertebral body which has a fused assimilation joint on the left. Grade 1 anterolisthesis of L5 on L6 with severe secondary degenerative disc disease. Recommend close radiographic correlation as to vertebral body level prior to any fluoroscopically-guided procedure.    Most recent CBC from 12/29/2016 showed white count of 4.6, H&H of 12.7/38.1 and a platelet count of 208. CMP was within normal limits including serum calcium of 9.2 and a normal total protein of 7.1. LDH was normal at 138. SPEP showed normal quantitative immunoglobulins and no M spike was observed but serum immunofixation showed free monoclonal gammopathy. Off note patient has had monoclonal gammopathy seen on immunofixation in January  2018 IgG lambda as well  Patient currently continues to have problems with headaches and dizziness for which she is seeing Dr. Manuella Ghazi. She is also currently recovering from sore throat and ear pain. Reports fatigue  ECOG PS- 1-2  Pain scale- 3- chronic headaches   Review of systems- Review of Systems  Constitutional: Positive for malaise/fatigue. Negative for chills, fever and weight loss.  HENT: Positive for ear pain. Negative for congestion, ear discharge and nosebleeds.   Eyes: Negative for blurred vision.  Respiratory: Negative for cough, hemoptysis, sputum production, shortness of breath and wheezing.   Cardiovascular: Negative for chest pain, palpitations, orthopnea and claudication.  Gastrointestinal: Negative for abdominal pain, blood in stool, constipation, diarrhea, heartburn, melena, nausea and vomiting.  Genitourinary: Negative for dysuria, flank pain, frequency, hematuria and urgency.  Musculoskeletal: Negative for back pain, joint pain and myalgias.  Skin: Negative for rash.  Neurological: Positive for dizziness and headaches. Negative for tingling, focal weakness, seizures and weakness.  Endo/Heme/Allergies: Does not bruise/bleed easily.  Psychiatric/Behavioral: Negative for depression and suicidal ideas. The patient does not have insomnia.     Allergies  Allergen Reactions  . Amoxicillin   . Clindamycin/Lincomycin   . Doxycycline   . Levaquin [Levofloxacin In D5w]   . Mirabegron Nausea And Vomiting  . Nsaids Other (See Comments)    GI upset  . Oxycodone Nausea Only    Other reaction(s): Hallucination  . Phenergan [Promethazine Hcl]   . Singulair [Montelukast]   . Vicodin [Hydrocodone-Acetaminophen]   . Zocor [Simvastatin]   . Zoloft  [Sertraline] Other (See Comments)  . Ampicillin Rash  . Bactrim [Sulfamethoxazole-Trimethoprim] Rash  . Penicillins Rash  Ampicillin, Clindamycin, Doxycycline Levaquin-severe headache, muscle and joint aches, weak    Patient  Active Problem List   Diagnosis Date Noted  . Dorsalgia 03/31/2016  . H/O total knee replacement 02/14/2016  . Systolic ejection murmur 01/07/2016  . Vitamin D deficiency 11/29/2015  . Allergy to other foods 11/29/2015  . Headache 10/28/2015  . Leg cramps 10/24/2015  . Cramp and spasm 10/24/2015  . Knee pain, bilateral 08/29/2015  . Sleep apnea 05/30/2015  . Restless legs syndrome 05/09/2015  . Allergic rhinitis 03/28/2015  . Anemia, unspecified 03/28/2015  . Anxiety and depression 03/28/2015  . Neck pain 03/28/2015  . Chronic headache 03/28/2015  . Depression 03/28/2015  . DDD (degenerative disc disease), lumbar 03/28/2015  . Gastro-esophageal reflux disease without esophagitis 03/28/2015  . 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 Essential (primary) hypertension 03/28/2015  . Adaptive colitis 03/28/2015  . Narrowing of intervertebral disc space 03/28/2015  . Nasal septal perforation 03/28/2015  . Recurrent sinus infections 03/28/2015  . Spinal stenosis 03/28/2015  . Gastric ulcer without hemorrhage or perforation 03/28/2015  . Cerebral artery occlusion with cerebral infarction (HCC) 03/28/2015  . Chronic sinusitis 03/28/2015  . Connective tissue and disc stenosis of intervertebral foramina of abdomen and other regions 03/28/2015  . Diverticulosis of large intestine without perforation or abscess without bleeding 03/28/2015  . Major depressive disorder, single episode 03/28/2015  . Osteoarthritis 03/28/2015  . Other specified functional intestinal disorders 03/28/2015  . Hardening of the aorta (main artery of the heart) (HCC) 07/13/2014  . H/O adenomatous polyp of colon 06/27/2005     Past Medical History:  Diagnosis Date  . Allergic rhinitis, cause unspecified   . Allergic rhinitis, cause unspecified   . Atherosclerosis of aorta (HCC)   . Back pain    lumbar  . Degeneration of lumbar or lumbosacral intervertebral disc   . Diverticulitis of  colon (without mention of hemorrhage)(562.11)   . Dizziness and giddiness   . Dysthymic disorder   . Headache(784.0)   . Myalgia and myositis, unspecified   . Osteoporosis, unspecified   . Other and unspecified hyperlipidemia   . Other diseases of nasal cavity and sinuses(478.19)   . Other specified disorders of bladder   . Syncope   . Trigger finger (acquired)   . Unspecified essential hypertension   . Unspecified sleep apnea   . Urinary frequency      Past Surgical History:  Procedure Laterality Date  . ABDOMINAL HYSTERECTOMY    . ADENOIDECTOMY    . APPENDECTOMY    . ARTHROSCOPIC KNEE SURGERY    . CARDIAC CATHETERIZATION     [facility]  . CARPAL TUNNEL RELEASE    . CATARACT EXTRACTION W/ INTRAOCULAR LENS IMPLANT & ANTERIOR VITRECTOMY, BILATERAL    . CHOLECYSTECTOMY    . KNEE ARTHROPLASTY Right 01/30/2016   Procedure: COMPUTER ASSISTED TOTAL KNEE ARTHROPLASTY;  Surgeon: Donato Heinz, MD;  Location: [facility] ORS;  Service: Orthopedics;  Laterality: Right;  . R/O Lymph Nodes; Right Axilla    . ROTATOR CUFF REPAIR Right   . SPG block  03/20/2016   sphenopalatine ganglion and maxillary division of trigeminal nerve block block Dr. Sherryll Burger  . TEMPORAL ARTERY BIOPSY / LIGATION    . TONSILLECTOMY    . TUBAL LIGATION      Social History   Social History  . Marital status: Widowed    Spouse name: N/A  . Number of children: N/A  . Years of education: N/A   Occupational History  .  Not on file.   Social History Main Topics  . Smoking status: Never Smoker  . Smokeless tobacco: Never Used  . Alcohol use No  . Drug use: No  . Sexual activity: Not on file   Other Topics Concern  . Not on file   Social History Narrative  . No narrative on file     Family History  Problem Relation Age of Onset  . Heart disease Mother   . Depression Mother   . Colon polyps Mother   . Dementia Mother   . Heart disease Father   . Dementia Father   . Heart disease Brother   . Hypertension Brother    . Heart disease Brother   . Hypertension Brother   . Kidney cancer Neg Hx   . Bladder Cancer Neg Hx      Current Outpatient Prescriptions:  .  acetaminophen (TYLENOL) 500 MG tablet, Take 500 mg by mouth 3 (three) times daily. , Disp: , Rfl:  .  alendronate (FOSAMAX) 70 MG tablet, TAKE ONE TABLET BY MOUTH EVERY WEEK AS DIRECTED, Disp: 12 tablet, Rfl: 1 .  atorvastatin (LIPITOR) 20 MG tablet, Take 1 tablet (20 mg total) by mouth daily at 6 PM., Disp: 90 tablet, Rfl: 3 .  azelastine (ASTELIN) 0.1 % nasal spray, Place 1 spray into both nostrils as needed for rhinitis. Use in each nostril as directed, Disp: 30 mL, Rfl: 1 .  azithromycin (ZITHROMAX) 250 MG tablet, Take 2 tablets prior to procedures, Disp: , Rfl:  .  azithromycin (ZITHROMAX) 250 MG tablet, Take 2 tablets PO on day one, and one tablet PO daily thereafter until completed., Disp: 6 tablet, Rfl: 1 .  calcium carbonate (TUMS - DOSED IN MG ELEMENTAL CALCIUM) 500 MG chewable tablet, Chew 1 tablet by mouth as needed. , Disp: , Rfl:  .  Calcium Carbonate-Vitamin D 600-400 MG-UNIT tablet, Take 1 tablet by mouth 2 (two) times daily. (Patient taking differently: Take 1 tablet by mouth daily with supper. ), Disp: 180 tablet, Rfl: 1 .  cholecalciferol (VITAMIN D) 1000 units tablet, Take 1 tablet (1,000 Units total) by mouth daily., Disp: 1 tablet, Rfl: 0 .  citalopram (CELEXA) 40 MG tablet, Take 1 tablet (40 mg total) by mouth daily., Disp: 90 tablet, Rfl: 3 .  docusate sodium (COLACE) 100 MG capsule, Take 100 mg by mouth as needed for mild constipation., Disp: , Rfl:  .  hydrochlorothiazide (HYDRODIURIL) 25 MG tablet, Take 1 tablet (25 mg total) by mouth daily. (Patient taking differently: Take 12.5 mg by mouth daily. 1/2 tablet), Disp: 90 tablet, Rfl: 3 .  lisinopril (PRINIVIL,ZESTRIL) 5 MG tablet, Take 1 tablet (5 mg total) by mouth daily., Disp: 30 tablet, Rfl: 1 .  Magnesium Oxide, Antacid, 500 MG CAPS, Take 1 tablet by mouth as needed.,  Disp: , Rfl:  .  Melatonin 1 MG TABS, Take 1 tablet by mouth as needed. , Disp: , Rfl:  .  Omega-3 Fatty Acids (FISH OIL) 1200 MG CAPS, Take 2 capsules by mouth daily. , Disp: , Rfl:  .  phenazopyridine (AZO-TABS) 95 MG tablet, Take 1 tablet (95 mg total) by mouth as needed., Disp: 30 tablet, Rfl: 3 .  polyethylene glycol powder (GLYCOLAX/MIRALAX) powder, Take 17 g by mouth 2 (two) times daily as needed., Disp: 3350 g, Rfl: 1 .  POTASSIUM GLUCONATE PO, Take 1 tablet by mouth daily with supper., Disp: , Rfl:  .  predniSONE (STERAPRED UNI-PAK 21 TAB) 10 MG (21) TBPK tablet,  Take as directed on package instructions, Disp: 21 tablet, Rfl: 0 .  ranitidine (ZANTAC) 150 MG tablet, Take 1 tablet (150 mg total) by mouth 2 (two) times daily., Disp: 180 tablet, Rfl: 1 .  Simethicone (GAS RELIEF PO), Take by mouth as needed. , Disp: , Rfl:  .  sodium chloride (OCEAN) 0.65 % nasal spray, Reported on 01/30/2016, Disp: , Rfl:  .  traMADol (ULTRAM) 50 MG tablet, Take 1 tablet (50 mg total) by mouth 2 (two) times daily as needed., Disp: 180 tablet, Rfl: 1   Physical exam:  Vitals:   03/26/17 0958  BP: 127/65  Pulse: 66  Temp: 98.2 F (36.8 C)  TempSrc: Tympanic  Weight: 156 lb 3.2 oz (70.9 kg)   Physical Exam  Constitutional: She is oriented to person, place, and time and well-developed, well-nourished, and in no distress.  HENT:  Head: Normocephalic and atraumatic.  Eyes: EOM are normal. Pupils are equal, round, and reactive to light.  Neck: Normal range of motion.  Cardiovascular: Normal rate, regular rhythm and normal heart sounds.   Pulmonary/Chest: Effort normal and breath sounds normal.  Abdominal: Soft. Bowel sounds are normal.  Neurological: She is alert and oriented to person, place, and time.  Skin: Skin is warm and dry.       CMP Latest Ref Rng & Units 07/23/2016  Glucose 65 - 99 mg/dL 86  BUN 8 - 27 mg/dL 17  Creatinine 0.57 - 1.00 mg/dL 0.71  Sodium 134 - 144 mmol/L 141  Potassium  3.5 - 5.2 mmol/L 4.5  Chloride 96 - 106 mmol/L 98  CO2 18 - 29 mmol/L 25  Calcium 8.7 - 10.3 mg/dL 9.5  Total Protein 6.0 - 8.5 g/dL 6.5  Total Bilirubin 0.0 - 1.2 mg/dL 0.4  Alkaline Phos 39 - 117 IU/L 53  AST 0 - 40 IU/L 21  ALT 0 - 32 IU/L 21   CBC Latest Ref Rng & Units 07/23/2016  WBC 3.4 - 10.8 x10E3/uL 7.1  Hemoglobin 12.0 - 16.0 g/dL -  Hematocrit 34.0 - 46.6 % 38.3  Platelets 150 - 379 x10E3/uL 245     Assessment and plan- Patient is a 83 y.o. female referred to [place] for MGUS  The patient has had recent blood work as well as Marine scientist from February 2018 which was consistent with MGUS. I discussed the results of the skeletal survey which did show was seen her C3 vertebra and lytic lesion could not be ruled out. However patient does not have any evidence of lytic lesions elsewhere and she has a very small amount of paraprotein in her serum. I do not think that the C3 lesion is characteristic for multiple myeloma and I will hold off on a PET CT scan at this point given her age and other ongoing symptoms.   Today I will obtain repeat CBC, CMP, myeloma panel, kappa lambda free light chains as well as random UPEP and urine Helen. If repeat immunofixation shows M protein to be less than 1.5 g I will see the patient back in 6 months time with repeat CBCs CMP and myeloma panel. I explained to the patient is natural history of MGUS and the evolution to over multiple myeloma being 1% per year which may not be significant for her age.     Thank you for this kind referral and the opportunity to participate in the care of this patient   Visit Diagnosis 1. MGUS (monoclonal gammopathy of unknown significance)  Dr. Randa Evens, MD, MPH [place] [facility] Pager- [phone] 03/26/2017  10:59 AM

## 2017-03-27 DIAGNOSIS — R251 Tremor, unspecified: Secondary | ICD-10-CM | POA: Diagnosis not present

## 2017-03-27 LAB — PROTEIN ELECTRO, RANDOM URINE
ALBUMIN ELP UR: 100 %
ALPHA-1-GLOBULIN, U: 0 %
Alpha-2-Globulin, U: 0 %
BETA GLOBULIN, U: 0 %
GAMMA GLOBULIN, U: 0 %
TOTAL PROTEIN, URINE-UPE24: 6.1 mg/dL

## 2017-03-27 LAB — KAPPA/LAMBDA LIGHT CHAINS
KAPPA FREE LGHT CHN: 22 mg/L — AB (ref 3.3–19.4)
Kappa, lambda light chain ratio: 1.19 (ref 0.26–1.65)
Lambda free light chains: 18.5 mg/L (ref 5.7–26.3)

## 2017-03-30 LAB — MULTIPLE MYELOMA PANEL, SERUM
ALBUMIN SERPL ELPH-MCNC: 3.8 g/dL (ref 2.9–4.4)
Albumin/Glob SerPl: 1.4 (ref 0.7–1.7)
Alpha 1: 0.2 g/dL (ref 0.0–0.4)
Alpha2 Glob SerPl Elph-Mcnc: 0.7 g/dL (ref 0.4–1.0)
B-Globulin SerPl Elph-Mcnc: 1.2 g/dL (ref 0.7–1.3)
Gamma Glob SerPl Elph-Mcnc: 0.7 g/dL (ref 0.4–1.8)
Globulin, Total: 2.8 g/dL (ref 2.2–3.9)
IGA: 211 mg/dL (ref 64–422)
IgG (Immunoglobin G), Serum: 783 mg/dL (ref 700–1600)
IgM, Serum: 71 mg/dL (ref 26–217)
TOTAL PROTEIN ELP: 6.6 g/dL (ref 6.0–8.5)

## 2017-03-31 DIAGNOSIS — Z96651 Presence of right artificial knee joint: Secondary | ICD-10-CM | POA: Diagnosis not present

## 2017-03-31 LAB — IMMUNOFIXATION, URINE

## 2017-04-02 ENCOUNTER — Ambulatory Visit (INDEPENDENT_AMBULATORY_CARE_PROVIDER_SITE_OTHER): Payer: Medicare Other | Admitting: Physician Assistant

## 2017-04-02 ENCOUNTER — Encounter: Payer: Self-pay | Admitting: Physician Assistant

## 2017-04-02 VITALS — BP 140/62 | HR 64 | Temp 98.5°F | Resp 16 | Ht 59.0 in | Wt 158.0 lb

## 2017-04-02 DIAGNOSIS — K5900 Constipation, unspecified: Secondary | ICD-10-CM

## 2017-04-02 DIAGNOSIS — F411 Generalized anxiety disorder: Secondary | ICD-10-CM | POA: Diagnosis not present

## 2017-04-02 DIAGNOSIS — J320 Chronic maxillary sinusitis: Secondary | ICD-10-CM | POA: Diagnosis not present

## 2017-04-02 MED ORDER — ALPRAZOLAM 0.25 MG PO TABS: 0.2500 mg | ORAL_TABLET | Freq: Two times a day (BID) | ORAL | 5 refills | Status: DC | PRN

## 2017-04-02 MED ORDER — POLYETHYLENE GLYCOL 3350 17 GM/SCOOP PO POWD: 17.0000 g | Freq: Two times a day (BID) | ORAL | 5 refills | Status: DC | PRN

## 2017-04-02 NOTE — Patient Instructions (Signed)
Pelvic Organ Prolapse Pelvic organ prolapse is the stretching, bulging, or dropping of pelvic organs into an abnormal position. It happens when the muscles and tissues that surround and support pelvic structures are stretched or weak. Pelvic organ prolapse can involve:  Vagina (vaginal prolapse).  Uterus (uterine prolapse).  Bladder (cystocele).  Rectum (rectocele).  Intestines (enterocele). When organs other than the vagina are involved, they often bulge into the vagina or protrude from the vagina, depending on how severe the prolapse is. What are the causes? Causes of this condition include:  Pregnancy, labor, and childbirth.  Long-lasting (chronic) cough.  Chronic constipation.  Obesity.  Past pelvic surgery.  Aging. During and after menopause, a decreased production of the hormone estrogen can weaken pelvic ligaments and muscles.  Consistently lifting more than 50 lb (23 kg).  Buildup of fluid in the abdomen due to certain diseases and other conditions. What are the signs or symptoms? Symptoms of this condition include:  Loss of bladder control when you cough, sneeze, strain, and exercise (stress incontinence). This may be worse immediately following childbirth, and it may gradually improve over time.  Feeling pressure in your pelvis or vagina. This pressure may increase when you cough or when you are having a bowel movement.  A bulge that protrudes from the opening of your vagina or against your vaginal wall. If your uterus protrudes through the opening of your vagina and rubs against your clothing, you may also experience soreness, ulcers, infection, pain, and bleeding.  Increased effort to have a bowel movement or urinate.  Pain in your low back.  Pain, discomfort, or disinterest in sexual intercourse.  Repeated bladder infections (urinary tract infections).  Difficulty inserting or inability to insert a tampon or applicator. In some people, this condition does  not cause any symptoms. How is this diagnosed? Your health care provider may perform an internal and external vaginal and rectal exam. During the exam, you may be asked to cough and strain while you are lying down, sitting, and standing up. Your health care provider will determine if other tests are required, such as bladder function tests. How is this treated? In most cases, this condition needs to be treated only if it produces symptoms. No treatment is guaranteed to correct the prolapse or relieve the symptoms completely. Treatment may include:  Lifestyle changes, such as:  Avoiding drinking beverages that contain caffeine.  Increasing your intake of high-fiber foods. This can help to decrease constipation and straining during bowel movements.  Emptying your bladder at scheduled times (bladder training therapy). This can help to reduce or avoid urinary incontinence.  Losing weight if you are overweight or obese.  Estrogen. Estrogen may help mild prolapse by increasing the strength and tone of pelvic floor muscles.  Kegel exercises. These may help mild cases of prolapse by strengthening and tightening the muscles of the pelvic floor.  Pessary insertion. A pessary is a soft, flexible device that is placed into your vagina by your health care provider to help support the vaginal walls and keep pelvic organs in place.  Surgery. This is often the only form of treatment for severe prolapse. Different types of surgeries are available. Follow these instructions at home:  Wear a sanitary pad or absorbent product if you have urinary incontinence.  Avoid heavy lifting and straining with exercise and work. Do not hold your breath when you perform mild to moderate lifting and exercise activities. Limit your activities as directed by your health care provider.  Take medicines  only as directed by your health care provider.  Perform Kegel exercises as directed by your health care provider.  If  you have a pessary, take care of it as directed by your health care provider. Contact a health care provider if:  Your symptoms interfere with your daily activities or sex life.  You need medicine to help with the discomfort.  You notice bleeding from the vagina that is not related to your period.  You have a fever.  You have pain or bleeding when you urinate.  You have bleeding when you have a bowel movement.  You lose urine when you have sex.  You have chronic constipation.  You have a pessary that falls out.  You have vaginal discharge that has a bad smell.  You have low abdominal pain or cramping that is unusual for you. This information is not intended to replace advice given to you by your health care provider. Make sure you discuss any questions you have with your health care provider. Document Released: 05/31/2014 Document Revised: 04/10/2016 Document Reviewed: 01/16/2014 Elsevier Interactive Patient Education  2017 [employer].

## 2017-04-02 NOTE — Progress Notes (Signed)
Patient: Helen Ramsey, Female    DOB: [birth year], 83 y.o.   MRN: [id number] Visit Date: 04/02/2017  Today's Provider: Mar Daring, PA-C   Chief Complaint  Patient presents with  . Panic Attack  . Fatigue   Subjective:   Patient scheduled today for AWE, pt is C/O of several symptoms and is requesting to do wellness at a another day.  Patient is C/O headache, sore throat, ear pain and panic attacks. Patient is here with her daughter in law [name]). She feels a lot of her symptoms are coming from uncontrolled anxiety and depression.   Review of Systems  Constitutional: Positive for activity change and fatigue.  HENT: Positive for congestion, dental problem, drooling, ear pain, postnasal drip, rhinorrhea, sinus pain and sore throat.   Eyes: Positive for photophobia and itching.  Respiratory: Positive for apnea, chest tightness and shortness of breath.   Cardiovascular: Negative.   Gastrointestinal: Positive for abdominal distention, anal bleeding, constipation and rectal pain.  Endocrine: Positive for cold intolerance, heat intolerance and polyuria.  Genitourinary: Positive for frequency and urgency.  Musculoskeletal: Positive for arthralgias, back pain, myalgias, neck pain and neck stiffness.  Skin: Negative.   Allergic/Immunologic: Positive for environmental allergies.  Neurological: Positive for dizziness, tremors, weakness, light-headedness, numbness and headaches.  Hematological: Bruises/bleeds easily.  Psychiatric/Behavioral: Positive for confusion, decreased concentration and sleep disturbance. The patient is nervous/anxious.     Social History   Social History  . Marital status: Widowed    Spouse name: N/A  . Number of children: N/A  . Years of education: N/A   Occupational History  . Not on file.   Social History Main Topics  . Smoking status: Never Smoker  . Smokeless tobacco: Never Used  . Alcohol use No  . Drug use: No  . Sexual activity:  Not on file   Other Topics Concern  . Not on file   Social History Narrative  . No narrative on file    Past Medical History:  Diagnosis Date  . Allergic rhinitis, cause unspecified   . Allergic rhinitis, cause unspecified   . Atherosclerosis of aorta ([place])   . Back pain    lumbar  . Degeneration of lumbar or lumbosacral intervertebral disc   . Diverticulitis of colon (without mention of hemorrhage)(562.11)   . Dizziness and giddiness   . Dysthymic disorder   . Headache(784.0)   . Myalgia and myositis, unspecified   . Osteoporosis, unspecified   . Other and unspecified hyperlipidemia   . Other diseases of nasal cavity and sinuses(478.19)   . Other specified disorders of bladder   . Syncope   . Trigger finger (acquired)   . Unspecified essential hypertension   . Unspecified sleep apnea   . Urinary frequency      Patient Active Problem List   Diagnosis Date Noted  . Dorsalgia 03/31/2016  . H/O total knee replacement 02/14/2016  . Systolic ejection murmur 27/01/5008  . Vitamin D deficiency 11/29/2015  . Allergy to other foods 11/29/2015  . Headache 10/28/2015  . Leg cramps 10/24/2015  . Cramp and spasm 10/24/2015  . Knee pain, bilateral 08/29/2015  . Sleep apnea 05/30/2015  . Restless legs syndrome 05/09/2015  . Allergic rhinitis 03/28/2015  . Anemia, unspecified 03/28/2015  . Anxiety and depression 03/28/2015  . Neck pain 03/28/2015  . Chronic headache 03/28/2015  . Depression 03/28/2015  . DDD (degenerative disc disease), lumbar 03/28/2015  . Gastro-esophageal reflux disease without esophagitis 03/28/2015  .  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 Essential (primary) hypertension 03/28/2015  . Adaptive colitis 03/28/2015  . Narrowing of intervertebral disc space 03/28/2015  . Nasal septal perforation 03/28/2015  . Recurrent sinus infections 03/28/2015  . Spinal stenosis 03/28/2015  . Gastric ulcer without hemorrhage or perforation  03/28/2015  . Cerebral artery occlusion with cerebral infarction ([place]) 03/28/2015  . Chronic sinusitis 03/28/2015  . Connective tissue and disc stenosis of intervertebral foramina of abdomen and other regions 03/28/2015  . Diverticulosis of large intestine without perforation or abscess without bleeding 03/28/2015  . Major depressive disorder, single episode 03/28/2015  . Osteoarthritis 03/28/2015  . Other specified functional intestinal disorders 03/28/2015  . Hardening of the aorta (main artery of the heart) ([place]) 07/13/2014  . H/O adenomatous polyp of colon 06/27/2005    Past Surgical History:  Procedure Laterality Date  . ABDOMINAL HYSTERECTOMY    . ADENOIDECTOMY    . APPENDECTOMY    . ARTHROSCOPIC KNEE SURGERY    . CARDIAC CATHETERIZATION     [facility]  . CARPAL TUNNEL RELEASE    . CATARACT EXTRACTION W/ INTRAOCULAR LENS IMPLANT & ANTERIOR VITRECTOMY, BILATERAL    . CHOLECYSTECTOMY    . KNEE ARTHROPLASTY Right 01/30/2016   Procedure: COMPUTER ASSISTED TOTAL KNEE ARTHROPLASTY;  Surgeon: Dereck Leep, MD;  Location: [facility] ORS;  Service: Orthopedics;  Laterality: Right;  . R/O Lymph Nodes; Right Axilla    . ROTATOR CUFF REPAIR Right   . SPG block  03/20/2016   sphenopalatine ganglion and maxillary division of trigeminal nerve block block Dr. Manuella Ghazi  . TEMPORAL ARTERY BIOPSY / LIGATION    . TONSILLECTOMY    . TUBAL LIGATION      Her family history includes Colon polyps in her mother; Dementia in her father and mother; Depression in her mother; Heart disease in her brother, brother, father, and mother; Hypertension in her brother and brother.      Current Outpatient Prescriptions:  .  acetaminophen (TYLENOL) 500 MG tablet, Take 500 mg by mouth 3 (three) times daily. , Disp: , Rfl:  .  alendronate (FOSAMAX) 70 MG tablet, TAKE ONE TABLET BY MOUTH EVERY WEEK AS DIRECTED, Disp: 12 tablet, Rfl: 1 .  atorvastatin (LIPITOR) 20 MG tablet, Take 1 tablet (20 mg total) by mouth daily at 6 PM.,  Disp: 90 tablet, Rfl: 3 .  azelastine (ASTELIN) 0.1 % nasal spray, Place 1 spray into both nostrils as needed for rhinitis. Use in each nostril as directed, Disp: 30 mL, Rfl: 1 .  calcium carbonate (TUMS - DOSED IN MG ELEMENTAL CALCIUM) 500 MG chewable tablet, Chew 1 tablet by mouth as needed. , Disp: , Rfl:  .  Calcium Carbonate-Vitamin D 600-400 MG-UNIT tablet, Take 1 tablet by mouth 2 (two) times daily. (Patient taking differently: Take 1 tablet by mouth daily. D-3 800iu), Disp: 180 tablet, Rfl: 1 .  cholecalciferol (VITAMIN D) 1000 units tablet, Take 1 tablet (1,000 Units total) by mouth daily., Disp: 1 tablet, Rfl: 0 .  citalopram (CELEXA) 40 MG tablet, Take 1 tablet (40 mg total) by mouth daily., Disp: 90 tablet, Rfl: 3 .  docusate sodium (COLACE) 100 MG capsule, Take 200 mg by mouth 2 (two) times daily. , Disp: , Rfl:  .  hydrochlorothiazide (HYDRODIURIL) 25 MG tablet, Take 1 tablet (25 mg total) by mouth daily. (Patient taking differently: Take 12.5 mg by mouth daily. 1/2 tablet), Disp: 90 tablet, Rfl: 3 .  lisinopril (PRINIVIL,ZESTRIL) 5 MG tablet, Take 1 tablet (5  mg total) by mouth daily., Disp: 30 tablet, Rfl: 1 .  Magnesium Oxide, Antacid, 500 MG CAPS, Take 1 tablet by mouth as needed., Disp: , Rfl:  .  Melatonin 1 MG TABS, Take 1 tablet by mouth as needed. , Disp: , Rfl:  .  Omega-3 Fatty Acids (FISH OIL) 1200 MG CAPS, Take 2 capsules by mouth daily. , Disp: , Rfl:  .  phenazopyridine (AZO-TABS) 95 MG tablet, Take 1 tablet (95 mg total) by mouth as needed., Disp: 30 tablet, Rfl: 3 .  polyethylene glycol powder (GLYCOLAX/MIRALAX) powder, Take 17 g by mouth 2 (two) times daily as needed., Disp: 3350 g, Rfl: 1 .  POTASSIUM GLUCONATE PO, Take 1 tablet by mouth daily with supper., Disp: , Rfl:  .  ranitidine (ZANTAC) 150 MG tablet, Take 1 tablet (150 mg total) by mouth 2 (two) times daily., Disp: 180 tablet, Rfl: 1 .  Simethicone (GAS RELIEF PO), Take by mouth as needed. , Disp: , Rfl:  .   sodium chloride (OCEAN) 0.65 % nasal spray, Reported on 01/30/2016, Disp: , Rfl:  .  traMADol (ULTRAM) 50 MG tablet, Take 1 tablet (50 mg total) by mouth 2 (two) times daily as needed., Disp: 180 tablet, Rfl: 1  Patient Care Team: Mar Daring, PA-C as PCP - General (Family Medicine)     Objective:   Vitals: BP 140/62 (BP Location: Left Arm, Patient Position: Sitting, Cuff Size: Large)   Pulse 64   Temp 98.5 F (36.9 C) (Oral)   Resp 16   Ht 4\' 11"  (1.499 m)   Wt 158 lb (71.7 kg)   BMI 31.91 kg/m   Physical Exam  Constitutional: She appears well-developed and well-nourished. No distress.  Neck: Normal range of motion. Neck supple. No JVD present. Carotid bruit is not present. No tracheal deviation present. No thyromegaly present.  Cardiovascular: Normal rate and regular rhythm.  Exam reveals no gallop and no friction rub.   Murmur heard.  Systolic murmur is present with a grade of 2/6  Pulmonary/Chest: Effort normal and breath sounds normal. No respiratory distress. She has no wheezes. She has no rales.  Lymphadenopathy:    She has no cervical adenopathy.  Skin: She is not diaphoretic.  Psychiatric: Her speech is normal and behavior is normal. Thought content normal. Her mood appears anxious. She expresses no homicidal and no suicidal ideation. She expresses no suicidal plans and no homicidal plans.  Vitals reviewed.   Activities of Daily Living In your present state of health, do you have any difficulty performing the following activities: 04/02/2017  Hearing? N  Vision? N  Difficulty concentrating or making decisions? Y  Walking or climbing stairs? Y  Dressing or bathing? N  Doing errands, shopping? Y  Some recent data might be hidden    Fall Risk Assessment Fall Risk  04/02/2017 03/31/2016 11/29/2015 05/30/2015  Falls in the past year? Yes Yes Yes No  Number falls in past yr: 2 or more 2 or more 1 -  Injury with Fall? No No No -     Depression Screen PHQ 2/9  Scores 04/02/2017 03/31/2016 11/29/2015 05/30/2015  PHQ - 2 Score 0 1 3 -  PHQ- 9 Score 3 - 15 -  Exception Documentation - - - Other- indicate reason in comment box  Not completed - - - Pt recently lost husband     Assessment & Plan:     Annual Wellness Visit  Reviewed patient's Family Medical History Reviewed and updated list  of patient's medical providers Assessment of cognitive impairment was done Assessed patient's functional ability Established a written schedule for health screening [place] Completed and Reviewed  Exercise Activities and Dietary recommendations Goals    None      Immunization History  Administered Date(s) Administered  . Influenza, High Dose Seasonal PF 08/29/2015, 09/01/2016  . Pneumococcal Conjugate-13 08/29/2015    Health Maintenance  Topic Date Due  . TETANUS/TDAP  07/09/1954  . PNA vac Low Risk Adult (2 of 2 - PPSV23) 08/28/2016  . INFLUENZA VACCINE  06/17/2017  . DEXA SCAN  Completed     Discussed health benefits of physical activity, and encouraged her to engage in regular exercise appropriate for her age and condition.   1. Constipation, unspecified constipation type Worsening constipation. Advised patient to discontinue fiber. Continue stool softener, add miralax. Advised to use miralax until stools are soft, but not loose or watery. I will see her back in 4 weeks to see if constipation has improved.  - polyethylene glycol powder (GLYCOLAX/MIRALAX) powder; Take 17 g by mouth 2 (two) times daily as needed.  Dispense: 3350 g; Refill: 5  2. GAD (generalized anxiety disorder) Worsening symptoms. Not sleeping well. Will add low dose alprazolam as below. Hesitant for higher doses due to increased fall risks. She is to continue Celexa 40mg  at this time. I will see her back in 4 weeks to recheck. - ALPRAZolam (XANAX) 0.25 MG tablet; Take 1 tablet (0.25 mg total) by mouth 2 (two) times daily as needed for anxiety.  Dispense:  30 tablet; Refill: 5  3. Chronic maxillary sinusitis Facial tenderness, upper tooth pain, ear pain and sore throat all seem to be associated with allergic rhinitis and chronic sinusitis. She reports symptoms are improving with her current allergy regimen of flonase sensimist, azelastine nasal spray and loratadine 10mg . She is also using saline nasal washes. She is to call if symptoms worsen.    I spent approximately 45 minutes with the patient today. Over 50% of this time was spent with counseling and educating the patient with anxiety and symptoms of anxiety.  ------------------------------------------------------------------------------------------------------------    Mar Daring, PA-C  [facility]

## 2017-04-06 ENCOUNTER — Other Ambulatory Visit: Payer: Self-pay | Admitting: Physician Assistant

## 2017-04-06 DIAGNOSIS — M1990 Unspecified osteoarthritis, unspecified site: Secondary | ICD-10-CM

## 2017-04-07 ENCOUNTER — Other Ambulatory Visit: Payer: Self-pay | Admitting: Family Medicine

## 2017-04-07 ENCOUNTER — Other Ambulatory Visit: Payer: Self-pay

## 2017-04-07 DIAGNOSIS — R600 Localized edema: Secondary | ICD-10-CM

## 2017-04-07 NOTE — Telephone Encounter (Signed)
Please review, Jennis patient-aa

## 2017-04-07 NOTE — Telephone Encounter (Signed)
May call in tramadol as previously prescribed 180 with 1 refill

## 2017-04-07 NOTE — Telephone Encounter (Signed)
Refill Authorization Request  Medication:Hydrochlorothiazide 12.5 Mg Tab   [facility]  Thanks,  -Muhamad Serano

## 2017-04-08 MED ORDER — HYDROCHLOROTHIAZIDE 12.5 MG PO TABS: 12.5000 mg | ORAL_TABLET | Freq: Every day | ORAL | 0 refills | Status: DC

## 2017-04-15 DIAGNOSIS — R293 Abnormal posture: Secondary | ICD-10-CM | POA: Diagnosis not present

## 2017-04-15 DIAGNOSIS — R296 Repeated falls: Secondary | ICD-10-CM | POA: Diagnosis not present

## 2017-04-15 DIAGNOSIS — R2681 Unsteadiness on feet: Secondary | ICD-10-CM | POA: Diagnosis not present

## 2017-04-16 ENCOUNTER — Other Ambulatory Visit: Payer: Medicare Other

## 2017-04-16 ENCOUNTER — Ambulatory Visit: Payer: Medicare Other | Admitting: Oncology

## 2017-04-30 ENCOUNTER — Encounter: Payer: Self-pay | Admitting: Physician Assistant

## 2017-04-30 ENCOUNTER — Ambulatory Visit (INDEPENDENT_AMBULATORY_CARE_PROVIDER_SITE_OTHER): Payer: Medicare Other | Admitting: Physician Assistant

## 2017-04-30 VITALS — BP 120/70 | HR 68 | Temp 98.3°F | Resp 16 | Wt 156.4 lb

## 2017-04-30 DIAGNOSIS — F419 Anxiety disorder, unspecified: Secondary | ICD-10-CM

## 2017-04-30 DIAGNOSIS — E7849 Other hyperlipidemia: Secondary | ICD-10-CM

## 2017-04-30 DIAGNOSIS — Z780 Asymptomatic menopausal state: Secondary | ICD-10-CM | POA: Diagnosis not present

## 2017-04-30 DIAGNOSIS — F339 Major depressive disorder, recurrent, unspecified: Secondary | ICD-10-CM

## 2017-04-30 DIAGNOSIS — E784 Other hyperlipidemia: Secondary | ICD-10-CM | POA: Diagnosis not present

## 2017-04-30 DIAGNOSIS — R7309 Other abnormal glucose: Secondary | ICD-10-CM

## 2017-04-30 DIAGNOSIS — Z1231 Encounter for screening mammogram for malignant neoplasm of breast: Secondary | ICD-10-CM | POA: Diagnosis not present

## 2017-04-30 DIAGNOSIS — Z1239 Encounter for other screening for malignant neoplasm of breast: Secondary | ICD-10-CM

## 2017-04-30 DIAGNOSIS — M8580 Other specified disorders of bone density and structure, unspecified site: Secondary | ICD-10-CM

## 2017-04-30 DIAGNOSIS — N8110 Cystocele, unspecified: Secondary | ICD-10-CM | POA: Diagnosis not present

## 2017-04-30 NOTE — Patient Instructions (Signed)

## 2017-04-30 NOTE — Progress Notes (Signed)
Patient: Helen Ramsey Female    DOB: [birth year]   83 y.o.   MRN: [id number] Visit Date: 04/30/2017  Today's Provider: Mar Daring, PA-C   Chief Complaint  Patient presents with  . Follow-up   Subjective:    HPI  Anxiety/Depression, Follow-up  She  was last seen for this 4 weeks ago. Patient is here today with her daughter in law [name]. Changes made at last visit include adding Xanax 0.25 mg.   She reports excellent compliance with treatment. She is not having side effects.   She reports excellent tolerance of treatment. Current symptoms include: patient denies any symptoms  She feels she is Improved since last visit. ------------------------------------------------------------------------ She is wanting to get her bone density and mammogram ordered. She does have known osteopenia and last BMD was in 02/2015. Last mammogram was in 05/16/2016. She does have personal history of fibrocystic changes.  She is also requesting a referral to Dr. Larey Days. She has a known cystocele that she feels has progressed and she is having worsening urine frequency. She is interested in considering a pessary. Her daughter-in-law sees Dr. Leonides Schanz.     Allergies  Allergen Reactions  . Amoxicillin   . Clindamycin/Lincomycin   . Doxycycline   . Levaquin [Levofloxacin In D5w]   . Mirabegron Nausea And Vomiting  . Nsaids Other (See Comments)    GI upset  . Oxycodone Nausea Only    Other reaction(s): Hallucination  . Phenergan [Promethazine Hcl]   . Singulair [Montelukast]   . Vicodin [Hydrocodone-Acetaminophen]   . Zocor [Simvastatin]   . Zoloft  [Sertraline] Other (See Comments)  . Ampicillin Rash  . Bactrim [Sulfamethoxazole-Trimethoprim] Rash  . Penicillins Rash    Ampicillin, Clindamycin, Doxycycline Levaquin-severe headache, muscle and joint aches, weak     Current Outpatient Prescriptions:  .  acetaminophen (TYLENOL) 500 MG tablet, Take 500 mg by mouth 3 (three)  times daily. , Disp: , Rfl:  .  alendronate (FOSAMAX) 70 MG tablet, TAKE ONE TABLET BY MOUTH EVERY WEEK AS DIRECTED, Disp: 12 tablet, Rfl: 1 .  ALPRAZolam (XANAX) 0.25 MG tablet, Take 1 tablet (0.25 mg total) by mouth 2 (two) times daily as needed for anxiety., Disp: 30 tablet, Rfl: 5 .  aspirin EC 81 MG tablet, Take 81 mg by mouth daily., Disp: , Rfl:  .  atorvastatin (LIPITOR) 20 MG tablet, Take 1 tablet (20 mg total) by mouth daily at 6 PM., Disp: 90 tablet, Rfl: 3 .  azelastine (ASTELIN) 0.1 % nasal spray, Place 1 spray into both nostrils as needed for rhinitis. Use in each nostril as directed, Disp: 30 mL, Rfl: 1 .  calcium carbonate (TUMS - DOSED IN MG ELEMENTAL CALCIUM) 500 MG chewable tablet, Chew 1 tablet by mouth as needed. , Disp: , Rfl:  .  Calcium Carbonate-Vitamin D 600-400 MG-UNIT tablet, Take 1 tablet by mouth 2 (two) times daily. (Patient taking differently: Take 1 tablet by mouth daily. D-3 800iu), Disp: 180 tablet, Rfl: 1 .  citalopram (CELEXA) 40 MG tablet, Take 1 tablet (40 mg total) by mouth daily., Disp: 90 tablet, Rfl: 3 .  docusate sodium (COLACE) 100 MG capsule, Take 200 mg by mouth 2 (two) times daily. , Disp: , Rfl:  .  hydrochlorothiazide (HYDRODIURIL) 12.5 MG tablet, Take 1 tablet (12.5 mg total) by mouth daily., Disp: 90 tablet, Rfl: 0 .  lisinopril (PRINIVIL,ZESTRIL) 5 MG tablet, Take 1 tablet (5 mg total) by mouth daily., Disp: 30  tablet, Rfl: 1 .  Magnesium Oxide, Antacid, 500 MG CAPS, Take 1 tablet by mouth as needed., Disp: , Rfl:  .  Melatonin 1 MG TABS, Take 1 tablet by mouth as needed. , Disp: , Rfl:  .  Omega-3 Fatty Acids (FISH OIL) 1200 MG CAPS, Take 2 capsules by mouth daily. , Disp: , Rfl:  .  phenazopyridine (AZO-TABS) 95 MG tablet, Take 1 tablet (95 mg total) by mouth as needed., Disp: 30 tablet, Rfl: 3 .  polyethylene glycol powder (GLYCOLAX/MIRALAX) powder, Take 17 g by mouth 2 (two) times daily as needed., Disp: 3350 g, Rfl: 5 .  POTASSIUM GLUCONATE  PO, Take 1 tablet by mouth daily with supper., Disp: , Rfl:  .  ranitidine (ZANTAC) 150 MG tablet, Take 1 tablet (150 mg total) by mouth 2 (two) times daily., Disp: 180 tablet, Rfl: 1 .  Simethicone (GAS RELIEF PO), Take by mouth as needed. , Disp: , Rfl:  .  sodium chloride (OCEAN) 0.65 % nasal spray, Reported on 01/30/2016, Disp: , Rfl:  .  traMADol (ULTRAM) 50 MG tablet, TAKE ONE TABLET TWICE A DAY AS NEEDED, Disp: 180 tablet, Rfl: 1  Review of Systems  Constitutional: Negative.   Respiratory: Negative.   Cardiovascular: Negative.   Gastrointestinal: Negative.   Genitourinary: Positive for enuresis (rarely), frequency and vaginal pain (pressure not pain). Negative for flank pain.  Psychiatric/Behavioral: Negative for agitation, decreased concentration, dysphoric mood, self-injury, sleep disturbance and suicidal ideas. The patient is nervous/anxious.     Social History  Substance Use Topics  . Smoking status: Never Smoker  . Smokeless tobacco: Never Used  . Alcohol use No   Objective:   BP 120/70 (BP Location: Left Arm, Patient Position: Sitting, Cuff Size: Large)   Pulse 68   Temp 98.3 F (36.8 C) (Oral)   Resp 16   Wt 156 lb 6.4 oz (70.9 kg)   BMI 31.59 kg/m  Vitals:   04/30/17 0837  BP: 120/70  Pulse: 68  Resp: 16  Temp: 98.3 F (36.8 C)  TempSrc: Oral  Weight: 156 lb 6.4 oz (70.9 kg)     Physical Exam  Constitutional: She appears well-developed and well-nourished. No distress.  HENT:  Head: Normocephalic and atraumatic.  Right Ear: Hearing, external ear and ear canal normal. Tympanic membrane is bulging. Tympanic membrane is not erythematous. A middle ear effusion (clear fluid) is present.  Left Ear: Hearing, tympanic membrane, external ear and ear canal normal.  Nose: Nose normal. Right sinus exhibits no maxillary sinus tenderness and no frontal sinus tenderness. Left sinus exhibits no maxillary sinus tenderness and no frontal sinus tenderness.  Mouth/Throat:  Uvula is midline, oropharynx is clear and moist and mucous membranes are normal. No oropharyngeal exudate, posterior oropharyngeal edema or posterior oropharyngeal erythema.  Eyes: Conjunctivae are normal. Pupils are equal, round, and reactive to light. Right eye exhibits no discharge. Left eye exhibits no discharge. No scleral icterus.  Neck: Normal range of motion. Neck supple. No JVD present. No tracheal deviation present. No thyromegaly present.  Cardiovascular: Normal rate, regular rhythm and normal heart sounds.  Exam reveals no gallop and no friction rub.   No murmur heard. Pulmonary/Chest: Effort normal and breath sounds normal. No stridor. No respiratory distress. She has no wheezes. She has no rales.  Lymphadenopathy:    She has no cervical adenopathy.  Skin: Skin is warm and dry. She is not diaphoretic.  Vitals reviewed.      Assessment & Plan:  1. Depression, recurrent ([place]) Improved. Continue citalopram 40mg  and xanax 0.25mg  prn.   2. Anxiety See above medical treatment plan.  3. Breast cancer screening There is no family history of breast cancer. She does not perform regular self breast exams any longer. Mammogram was ordered as below. Information for [facility] clinic was given to patient so she may schedule her mammogram at her convenience. - MM Digital Screening; Future  4. Osteopenia, unspecified location Bone density ordered as below. Will f/u pending results.  - DG Bone Density; Future  5. Postmenopausal estrogen deficiency See above medical treatment plan. - DG Bone Density; Future  6. Female cystocele Patient has a known cystocele that she feels has progressed as she is having increased urine frequency and some vaginal pressure. She wishes to defer pelvic exam at this time as "the gynecologist will have to do an exam anyway." She is interested in seeing if she is a candidate for a pessary.  - Ambulatory referral to Gynecology  7. Familial multiple  lipoprotein-type hyperlipidemia Will check labs as below and f/u pending results. - Lipid Profile  8. Elevated glucose Will check labs as below and f/u pending results. - HgB A1c       Mar Daring, PA-C  [facility]

## 2017-05-01 LAB — LIPID PANEL
CHOL/HDL RATIO: 3 ratio (ref 0.0–4.4)
Cholesterol, Total: 148 mg/dL (ref 100–199)
HDL: 49 mg/dL (ref >39)
LDL Calculated: 81 mg/dL (ref 0–99)
Triglycerides: 92 mg/dL (ref 0–149)
VLDL Cholesterol Cal: 18 mg/dL (ref 5–40)

## 2017-05-01 LAB — HEMOGLOBIN A1C
Est. average glucose Bld gHb Est-mCnc: 111 mg/dL
HEMOGLOBIN A1C: 5.5 % (ref 4.8–5.6)

## 2017-05-04 NOTE — Progress Notes (Signed)
Advised  ED 

## 2017-05-18 ENCOUNTER — Other Ambulatory Visit: Payer: Self-pay | Admitting: Physician Assistant

## 2017-05-18 DIAGNOSIS — K449 Diaphragmatic hernia without obstruction or gangrene: Secondary | ICD-10-CM

## 2017-05-18 DIAGNOSIS — K219 Gastro-esophageal reflux disease without esophagitis: Secondary | ICD-10-CM

## 2017-05-27 ENCOUNTER — Other Ambulatory Visit: Payer: Self-pay | Admitting: Physician Assistant

## 2017-05-27 DIAGNOSIS — E785 Hyperlipidemia, unspecified: Secondary | ICD-10-CM

## 2017-05-27 NOTE — Telephone Encounter (Signed)
Last ov 04/30/17  Please review. Thank you. sd

## 2017-06-17 ENCOUNTER — Ambulatory Visit
Admission: RE | Admit: 2017-06-17 | Discharge: 2017-06-17 | Disposition: A | Discharge status: Home / Self Care | Payer: Medicare Other | Source: Ambulatory Visit | Attending: Physician Assistant | Admitting: Physician Assistant

## 2017-06-17 DIAGNOSIS — M85852 Other specified disorders of bone density and structure, left thigh: Secondary | ICD-10-CM | POA: Insufficient documentation

## 2017-06-17 DIAGNOSIS — M8580 Other specified disorders of bone density and structure, unspecified site: Secondary | ICD-10-CM

## 2017-06-17 DIAGNOSIS — Z1231 Encounter for screening mammogram for malignant neoplasm of breast: Secondary | ICD-10-CM | POA: Insufficient documentation

## 2017-06-17 DIAGNOSIS — Z780 Asymptomatic menopausal state: Secondary | ICD-10-CM

## 2017-06-17 DIAGNOSIS — M8588 Other specified disorders of bone density and structure, other site: Secondary | ICD-10-CM | POA: Diagnosis not present

## 2017-06-17 DIAGNOSIS — Z1239 Encounter for other screening for malignant neoplasm of breast: Secondary | ICD-10-CM

## 2017-06-17 DIAGNOSIS — M810 Age-related osteoporosis without current pathological fracture: Secondary | ICD-10-CM | POA: Diagnosis not present

## 2017-06-18 ENCOUNTER — Telehealth: Payer: Self-pay

## 2017-06-18 NOTE — Telephone Encounter (Signed)
-----   Message from Mar Daring, [place] sent at 06/17/2017 12:55 PM EDT ----- BMD decreased from last year and is down to -2.4. Had been -1.7. Can we make sure patient is taking fosamax weekly? If she is not, or if she is not tolerating, and since BMD has decreased may be beneficial to refer to endocrinology for consideration of Prolia (6 month injectable for osteoporosis).

## 2017-06-18 NOTE — Telephone Encounter (Signed)
-----   Message from Mar Daring, [place] sent at 06/17/2017  5:39 PM EDT ----- Normal mammogram. Repeat screening in one year.

## 2017-06-18 NOTE — Telephone Encounter (Signed)
Patient advised as directed below.  Thanks,  -Joseline 

## 2017-06-18 NOTE — Telephone Encounter (Signed)
Patient advised as directed below. Pt reports that she is taking her Fosamax weekly. Patient wants the referral to Endocrine. She want Mickel Fuchs to be contact for this appointment since she is the one that takes her to her appoitments. Her number is [phone]  Thanks,  -Arles Rumbold

## 2017-06-19 ENCOUNTER — Telehealth: Payer: Self-pay | Admitting: Physician Assistant

## 2017-06-19 NOTE — Telephone Encounter (Signed)
Noted.  Parke Poisson please see below and d/c referral at this time.

## 2017-06-19 NOTE — Telephone Encounter (Signed)
Pt stated that she would like to wait on the endocrinology appointment until after she speaks with Tawanna Sat about it further at her OV on 07/28/17. Please advise. Thanks TNP

## 2017-06-19 NOTE — Telephone Encounter (Signed)
Please review

## 2017-06-26 DIAGNOSIS — N819 Female genital prolapse, unspecified: Secondary | ICD-10-CM | POA: Diagnosis not present

## 2017-06-27 ENCOUNTER — Other Ambulatory Visit: Payer: Self-pay | Admitting: Physician Assistant

## 2017-06-27 DIAGNOSIS — R600 Localized edema: Secondary | ICD-10-CM

## 2017-07-16 ENCOUNTER — Other Ambulatory Visit: Payer: Self-pay | Admitting: Physician Assistant

## 2017-07-16 DIAGNOSIS — M810 Age-related osteoporosis without current pathological fracture: Secondary | ICD-10-CM

## 2017-07-21 DIAGNOSIS — G4733 Obstructive sleep apnea (adult) (pediatric): Secondary | ICD-10-CM | POA: Diagnosis not present

## 2017-07-21 DIAGNOSIS — R251 Tremor, unspecified: Secondary | ICD-10-CM | POA: Diagnosis not present

## 2017-07-21 DIAGNOSIS — D472 Monoclonal gammopathy: Secondary | ICD-10-CM | POA: Diagnosis not present

## 2017-07-21 DIAGNOSIS — R2689 Other abnormalities of gait and mobility: Secondary | ICD-10-CM | POA: Diagnosis not present

## 2017-07-21 DIAGNOSIS — R42 Dizziness and giddiness: Secondary | ICD-10-CM | POA: Diagnosis not present

## 2017-07-21 DIAGNOSIS — M5481 Occipital neuralgia: Secondary | ICD-10-CM | POA: Diagnosis not present

## 2017-07-28 ENCOUNTER — Encounter: Payer: Self-pay | Admitting: Physician Assistant

## 2017-07-28 ENCOUNTER — Ambulatory Visit (INDEPENDENT_AMBULATORY_CARE_PROVIDER_SITE_OTHER): Payer: Medicare Other | Admitting: Physician Assistant

## 2017-07-28 VITALS — BP 132/78 | HR 59 | Temp 98.2°F | Resp 16 | Wt 162.0 lb

## 2017-07-28 DIAGNOSIS — Z23 Encounter for immunization: Secondary | ICD-10-CM | POA: Diagnosis not present

## 2017-07-28 DIAGNOSIS — F324 Major depressive disorder, single episode, in partial remission: Secondary | ICD-10-CM

## 2017-07-28 DIAGNOSIS — I10 Essential (primary) hypertension: Secondary | ICD-10-CM | POA: Diagnosis not present

## 2017-07-28 NOTE — Patient Instructions (Signed)
Major Depressive Disorder, Adult Major depressive disorder (MDD) is a mental health condition. It may also be called clinical depression or unipolar depression. MDD usually causes feelings of sadness, hopelessness, or helplessness. MDD can also cause physical symptoms. It can interfere with work, school, relationships, and other everyday activities. MDD may be mild, moderate, or severe. It may occur once (single episode major depressive disorder) or it may occur multiple times (recurrent major depressive disorder). What are the causes? The exact cause of this condition is not known. MDD is most likely caused by a combination of things, which may include:  Genetic factors. These are traits that are passed along from parent to child.  Individual factors. Your personality, your behavior, and the way you handle your thoughts and feelings may contribute to MDD. This includes personality traits and behaviors learned from others.  Physical factors, such as: ? Differences in the part of your brain that controls emotion. This part of your brain may be different than it is in people who do not have MDD. ? Long-term (chronic) medical or psychiatric illnesses.  Social factors. Traumatic experiences or major life changes may play a role in the development of MDD.  What increases the risk? This condition is more likely to develop in women. The following factors may also make you more likely to develop MDD:  A family history of depression.  Troubled family relationships.  Abnormally low levels of certain brain chemicals.  Traumatic events in childhood, especially abuse or the loss of a parent.  Being under a lot of stress, or long-term stress, especially from upsetting life experiences or losses.  A history of: ? Chronic physical illness. ? Other mental health disorders. ? Substance abuse.  Poor living conditions.  Experiencing social exclusion or discrimination on a regular basis.  What are  the signs or symptoms? The main symptoms of MDD typically include:  Constant depressed or irritable mood.  Loss of interest in things and activities.  MDD symptoms may also include:  Sleeping or eating too much or too little.  Unexplained weight change.  Fatigue or low energy.  Feelings of worthlessness or guilt.  Difficulty thinking clearly or making decisions.  Thoughts of suicide or of harming others.  Physical agitation or weakness.  Isolation.  Severe cases of MDD may also occur with other symptoms, such as:  Delusions or hallucinations, in which you imagine things that are not real (psychotic depression).  Low-level depression that lasts at least a year (chronic depression or persistent depressive disorder).  Extreme sadness and hopelessness (melancholic depression).  Trouble speaking and moving (catatonic depression).  How is this diagnosed? This condition may be diagnosed based on:  Your symptoms.  Your medical history, including your mental health history. This may involve tests to evaluate your mental health. You may be asked questions about your lifestyle, including any drug and alcohol use, and how long you have had symptoms of MDD.  A physical exam.  Blood tests to rule out other conditions.  You must have a depressed mood and at least four other MDD symptoms most of the day, nearly every day in the same 2-week timeframe before your health care provider can confirm a diagnosis of MDD. How is this treated? This condition is usually treated by mental health professionals, such as psychologists, psychiatrists, and clinical social workers. You may need more than one type of treatment. Treatment may include:  Psychotherapy. This is also called talk therapy or counseling. Types of psychotherapy include: ? Cognitive behavioral   therapy (CBT). This type of therapy teaches you to recognize unhealthy feelings, thoughts, and behaviors, and replace them with  positive thoughts and actions. ? Interpersonal therapy (IPT). This helps you to improve the way you relate to and communicate with others. ? Family therapy. This treatment includes members of your family.  Medicine to treat anxiety and depression, or to help you control certain emotions and behaviors.  Lifestyle changes, such as: ? Limiting alcohol and drug use. ? Exercising regularly. ? Getting plenty of sleep. ? Making healthy eating choices. ? Spending more time outdoors.  Treatments involving stimulation of the brain can be used in situations with extremely severe symptoms, or when medicine or other therapies do not work over time. These treatments include electroconvulsive therapy, transcranial magnetic stimulation, and vagal nerve stimulation. Follow these instructions at home: Activity  Return to your normal activities as told by your health care provider.  Exercise regularly and spend time outdoors as told by your health care provider. General instructions  Take over-the-counter and prescription medicines only as told by your health care provider.  Do not drink alcohol. If you drink alcohol, limit your alcohol intake to no more than 1 drink a day for nonpregnant women and 2 drinks a day for men. One drink equals 12 oz of beer, 5 oz of wine, or 1 oz of hard liquor. Alcohol can affect any antidepressant medicines you are taking. Talk to your health care provider about your alcohol use.  Eat a healthy diet and get plenty of sleep.  Find activities that you enjoy doing, and make time to do them.  Consider joining a support group. Your health care provider may be able to recommend a support group.  Keep all follow-up visits as told by your health care provider. This is important. Where to find more information: National Alliance on Mental Illness  www.nami.org  [place] National Institute of Mental Health  www.nimh.nih.gov  National Suicide Prevention  Lifeline  1-800-273-TALK (8255). This is free, 24-hour help.  Contact a health care provider if:  Your symptoms get worse.  You develop new symptoms. Get help right away if:  You self-harm.  You have serious thoughts about hurting yourself or others.  You see, hear, taste, smell, or feel things that are not present (hallucinate). This information is not intended to replace advice given to you by your health care provider. Make sure you discuss any questions you have with your health care provider. Document Released: 02/28/2013 Document Revised: 07/10/2016 Document Reviewed: 05/14/2016 Elsevier Interactive Patient Education  2017 [employer]  

## 2017-07-28 NOTE — Progress Notes (Signed)
Patient: Helen Ramsey Female    DOB: 1933-03-27   83 y.o.   MRN: [id number] Visit Date: 07/28/2017  Today's Provider: Mar Daring, PA-C   Chief Complaint  Patient presents with  . Follow-up    Depression and Anxiety   Subjective:    HPI  Depression/Anxiety, Follow up:  The patient was last seen for Depression/Anxiety 3 months ago.Patient is here today with her daughter in law [name]. Changes made since that visit include none. Continue Citalopram 40mg  and Xanax 0.25mg  prn  She complains of depressed mood, fatigue, feelings of worthlessness/guilt and crying.  She reports excellent compliance with treatment. She is not having side effects.  It was her anniversary at the end of August and that has worsened her mood. However, she is making a lot of quilts and this is helping distract her and feels she is overall doing better.   ------------------------------------------------------------------------ Depression screen [facility] 2/9 07/28/2017 04/02/2017 03/31/2016  Decreased Interest 1 0 1  Down, Depressed, Hopeless 1 0 0  PHQ - 2 Score 2 0 1  Altered sleeping 0 1 -  Tired, decreased energy 3 0 -  Change in appetite 0 0 -  Feeling bad or failure about yourself  3 0 -  Trouble concentrating 0 1 -  Moving slowly or fidgety/restless 0 1 -  Suicidal thoughts 0 0 -  PHQ-9 Score 8 3 -  Difficult doing work/chores Not difficult at all Somewhat difficult -    GAD 7 : Generalized Anxiety Score 07/28/2017  Nervous, Anxious, on Edge 0  Control/stop worrying 0  Worry too much - different things 0  Trouble relaxing 0  Restless 0  Easily annoyed or irritable 0  Afraid - awful might happen 0  Total GAD 7 Score 0  Anxiety Difficulty Not difficult at all    Allergies  Allergen Reactions  . Amoxicillin   . Clindamycin/Lincomycin   . Doxycycline   . Levaquin [Levofloxacin In D5w]   . Mirabegron Nausea And Vomiting  . Nsaids Other (See Comments)    GI upset  . Oxycodone  Nausea Only    Other reaction(s): Hallucination  . Phenergan [Promethazine Hcl]   . Singulair [Montelukast]   . Vicodin [Hydrocodone-Acetaminophen]   . Zocor [Simvastatin]   . Zoloft  [Sertraline] Other (See Comments)  . Ampicillin Rash  . Bactrim [Sulfamethoxazole-Trimethoprim] Rash  . Penicillins Rash    Ampicillin, Clindamycin, Doxycycline Levaquin-severe headache, muscle and joint aches, weak     Current Outpatient Prescriptions:  .  acetaminophen (TYLENOL) 500 MG tablet, Take 500 mg by mouth 3 (three) times daily. , Disp: , Rfl:  .  alendronate (FOSAMAX) 70 MG tablet, TAKE ONE TABLET BY MOUTH EVERY WEEK AS DIRECTED, Disp: 12 tablet, Rfl: 1 .  ALPRAZolam (XANAX) 0.25 MG tablet, Take 1 tablet (0.25 mg total) by mouth 2 (two) times daily as needed for anxiety., Disp: 30 tablet, Rfl: 5 .  aspirin EC 81 MG tablet, Take 81 mg by mouth daily., Disp: , Rfl:  .  atorvastatin (LIPITOR) 20 MG tablet, TAKE ONE (1) TABLET EACH DAY AT 6 IN THE EVENING, Disp: 90 tablet, Rfl: 3 .  azelastine (ASTELIN) 0.1 % nasal spray, Place 1 spray into both nostrils as needed for rhinitis. Use in each nostril as directed, Disp: 30 mL, Rfl: 1 .  calcium carbonate (TUMS - DOSED IN MG ELEMENTAL CALCIUM) 500 MG chewable tablet, Chew 1 tablet by mouth as needed. , Disp: , Rfl:  .  Calcium Carbonate-Vitamin D 600-400 MG-UNIT tablet, Take 1 tablet by mouth 2 (two) times daily. (Patient taking differently: Take 1 tablet by mouth daily. D-3 800iu), Disp: 180 tablet, Rfl: 1 .  citalopram (CELEXA) 40 MG tablet, Take 1 tablet (40 mg total) by mouth daily., Disp: 90 tablet, Rfl: 3 .  docusate sodium (COLACE) 100 MG capsule, Take 200 mg by mouth 2 (two) times daily. , Disp: , Rfl:  .  hydrochlorothiazide (HYDRODIURIL) 12.5 MG tablet, Take 1 tablet (12.5 mg total) by mouth daily., Disp: 90 tablet, Rfl: 1 .  lisinopril (PRINIVIL,ZESTRIL) 5 MG tablet, TAKE ONE (1) TABLET EACH DAY, Disp: 90 tablet, Rfl: 3 .  Magnesium Oxide,  Antacid, 500 MG CAPS, Take 1 tablet by mouth as needed., Disp: , Rfl:  .  Melatonin 1 MG TABS, Take 1 tablet by mouth as needed. , Disp: , Rfl:  .  Omega-3 Fatty Acids (FISH OIL) 1200 MG CAPS, Take 2 capsules by mouth daily. , Disp: , Rfl:  .  phenazopyridine (AZO-TABS) 95 MG tablet, Take 1 tablet (95 mg total) by mouth as needed., Disp: 30 tablet, Rfl: 3 .  polyethylene glycol powder (GLYCOLAX/MIRALAX) powder, Take 17 g by mouth 2 (two) times daily as needed., Disp: 3350 g, Rfl: 5 .  POTASSIUM GLUCONATE PO, Take 1 tablet by mouth daily with supper., Disp: , Rfl:  .  ranitidine (ZANTAC) 150 MG tablet, TAKE ONE TABLET TWICE DAILY, Disp: 180 tablet, Rfl: 1 .  Simethicone (GAS RELIEF PO), Take by mouth as needed. , Disp: , Rfl:  .  sodium chloride (OCEAN) 0.65 % nasal spray, Reported on 01/30/2016, Disp: , Rfl:  .  topiramate (TOPAMAX) 25 MG tablet, Take by mouth., Disp: , Rfl:  .  traMADol (ULTRAM) 50 MG tablet, TAKE ONE TABLET TWICE A DAY AS NEEDED, Disp: 180 tablet, Rfl: 1  Review of Systems  Constitutional: Negative.   Respiratory: Negative.   Cardiovascular: Negative.   Gastrointestinal: Negative.   Neurological: Negative.   Psychiatric/Behavioral: Positive for dysphoric mood. Negative for self-injury, sleep disturbance and suicidal ideas. The patient is nervous/anxious.     Social History  Substance Use Topics  . Smoking status: Never Smoker  . Smokeless tobacco: Never Used  . Alcohol use No   Objective:   BP 132/78 (BP Location: Left Arm, Patient Position: Sitting, Cuff Size: Normal)   Pulse (!) 59   Temp 98.2 F (36.8 C) (Oral)   Resp 16   Wt 162 lb (73.5 kg)   BMI 32.72 kg/m    Physical Exam  Constitutional: She appears well-developed and well-nourished. No distress.  Neck: Normal range of motion. Neck supple. No JVD present. No tracheal deviation present. No thyromegaly present.  Cardiovascular: Normal rate and regular rhythm.  Exam reveals no gallop and no friction  rub.   Murmur heard.  Systolic murmur is present with a grade of 2/6  Pulmonary/Chest: Effort normal and breath sounds normal. No respiratory distress. She has no wheezes. She has no rales.  Lymphadenopathy:    She has no cervical adenopathy.  Skin: She is not diaphoretic.  Vitals reviewed.     Assessment & Plan:     1. Major depressive disorder with single episode, in partial remission (HCC) Stable. Continue celexa 40mg  daily and alprazolam prn.   2. Essential (primary) hypertension Stable. Continue HCTZ 12.5mg , lisinopril 5mg .   3. Need for influenza vaccination Flu vaccine given today without complication. Patient sat upright for 15 minutes to check for adverse reaction before  being released. - Flu vaccine HIGH DOSE PF (Fluzone High dose)  4. Need for pneumococcal vaccination Pneumovax vaccine given to patient without complications. Patient sat for 15 minutes after administration and was tolerated well without adverse effects. - Pneumococcal polysaccharide vaccine 23-valent greater than or equal to 2yo subcutaneous/IM       Mar Daring, PA-C  [place]

## 2017-08-11 ENCOUNTER — Telehealth: Payer: Self-pay | Admitting: Physician Assistant

## 2017-08-11 NOTE — Telephone Encounter (Signed)
Please advised  

## 2017-08-11 NOTE — Telephone Encounter (Signed)
Pt blood pressure has been low today to 115/59 then 102/51  Should she be worried.  Call back is [phone]  Thanks Con Memos

## 2017-08-12 NOTE — Telephone Encounter (Signed)
With BP running low stop HCTZ and recheck BP. Call if still low.

## 2017-08-12 NOTE — Telephone Encounter (Signed)
Patient advised as directed below. Still take Lisinopril and push fluids per Tawanna Sat.  Thanks,  -Joseline

## 2017-09-15 DIAGNOSIS — H43813 Vitreous degeneration, bilateral: Secondary | ICD-10-CM | POA: Diagnosis not present

## 2017-09-25 ENCOUNTER — Inpatient Hospital Stay: Payer: Medicare Other

## 2017-09-25 ENCOUNTER — Inpatient Hospital Stay: Payer: Medicare Other | Attending: Oncology | Admitting: Oncology

## 2017-09-25 ENCOUNTER — Encounter: Payer: Self-pay | Admitting: Oncology

## 2017-09-25 VITALS — BP 111/60 | HR 58 | Temp 98.2°F | Resp 18 | Wt 160.0 lb

## 2017-09-25 DIAGNOSIS — M5136 Other intervertebral disc degeneration, lumbar region: Secondary | ICD-10-CM | POA: Diagnosis not present

## 2017-09-25 DIAGNOSIS — Z79899 Other long term (current) drug therapy: Secondary | ICD-10-CM

## 2017-09-25 DIAGNOSIS — Z7982 Long term (current) use of aspirin: Secondary | ICD-10-CM | POA: Insufficient documentation

## 2017-09-25 DIAGNOSIS — R350 Frequency of micturition: Secondary | ICD-10-CM | POA: Diagnosis not present

## 2017-09-25 DIAGNOSIS — G4730 Sleep apnea, unspecified: Secondary | ICD-10-CM | POA: Diagnosis not present

## 2017-09-25 DIAGNOSIS — M489 Spondylopathy, unspecified: Secondary | ICD-10-CM | POA: Diagnosis not present

## 2017-09-25 DIAGNOSIS — M810 Age-related osteoporosis without current pathological fracture: Secondary | ICD-10-CM | POA: Diagnosis not present

## 2017-09-25 DIAGNOSIS — R42 Dizziness and giddiness: Secondary | ICD-10-CM | POA: Insufficient documentation

## 2017-09-25 DIAGNOSIS — D472 Monoclonal gammopathy: Secondary | ICD-10-CM | POA: Insufficient documentation

## 2017-09-25 DIAGNOSIS — Z8619 Personal history of other infectious and parasitic diseases: Secondary | ICD-10-CM

## 2017-09-25 DIAGNOSIS — E785 Hyperlipidemia, unspecified: Secondary | ICD-10-CM | POA: Diagnosis not present

## 2017-09-25 DIAGNOSIS — I700 Atherosclerosis of aorta: Secondary | ICD-10-CM | POA: Insufficient documentation

## 2017-09-25 DIAGNOSIS — I10 Essential (primary) hypertension: Secondary | ICD-10-CM | POA: Diagnosis not present

## 2017-09-25 DIAGNOSIS — R55 Syncope and collapse: Secondary | ICD-10-CM | POA: Diagnosis not present

## 2017-09-25 LAB — COMPREHENSIVE METABOLIC PANEL
ALBUMIN: 3.9 g/dL (ref 3.5–5.0)
ALK PHOS: 48 U/L (ref 38–126)
ALT: 17 U/L (ref 14–54)
AST: 19 U/L (ref 15–41)
Anion gap: 6 (ref 5–15)
BILIRUBIN TOTAL: 0.5 mg/dL (ref 0.3–1.2)
BUN: 20 mg/dL (ref 6–20)
CALCIUM: 9.1 mg/dL (ref 8.9–10.3)
CO2: 25 mmol/L (ref 22–32)
Chloride: 103 mmol/L (ref 101–111)
Creatinine, Ser: 0.85 mg/dL (ref 0.44–1.00)
GFR calc Af Amer: >60 mL/min (ref >60)
GFR calc non Af Amer: >60 mL/min (ref >60)
GLUCOSE: 95 mg/dL (ref 65–99)
Potassium: 4.2 mmol/L (ref 3.5–5.1)
SODIUM: 134 mmol/L — AB (ref 135–145)
TOTAL PROTEIN: 6.7 g/dL (ref 6.5–8.1)

## 2017-09-25 LAB — CBC WITH DIFFERENTIAL/PLATELET
BASOS ABS: 0 10*3/uL (ref 0–0.1)
BASOS PCT: 1 %
EOS PCT: 5 %
Eosinophils Absolute: 0.2 10*3/uL (ref 0–0.7)
HEMATOCRIT: 36.2 % (ref 35.0–47.0)
Hemoglobin: 12.5 g/dL (ref 12.0–16.0)
LYMPHS PCT: 30 %
Lymphs Abs: 1.4 10*3/uL (ref 1.0–3.6)
MCH: 31.1 pg (ref 26.0–34.0)
MCHC: 34.6 g/dL (ref 32.0–36.0)
MCV: 89.7 fL (ref 80.0–100.0)
MONO ABS: 0.5 10*3/uL (ref 0.2–0.9)
MONOS PCT: 12 %
Neutro Abs: 2.5 10*3/uL (ref 1.4–6.5)
Neutrophils Relative %: 52 %
PLATELETS: 183 10*3/uL (ref 150–440)
RBC: 4.04 MIL/uL (ref 3.80–5.20)
RDW: 13.2 % (ref 11.5–14.5)
WBC: 4.7 10*3/uL (ref 3.6–11.0)

## 2017-09-25 NOTE — Progress Notes (Signed)
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 Jennings Books from urology for symptoms of occipital neuralgia as well as dizziness. As a part of the workup patient had blood work done which showed abnormal paraprotein in her serum and hence has been referred to [place]. The patient is already been due hematology who repeated a workup for her SPEP as mentioned below. They also repeat skeletal survey which showed: 1.Mildly expansile rounded lucency within the mid C2 vertebral body on lateral view is favored to represent a normal lateral mass en face. A lytic lesion is felt less likely, however cannot be excluded. 2.Transitional anatomy at the lumbosacral junction with 6 nonrib-bearing bearing lumbar type vertebral bodies. For purposes of this exam the most inferior will be labeled as L6. There is of the L6 vertebral body which has a fused assimilation joint on the left. Grade 1 anterolisthesis of L5 on L6 with severe secondary degenerative disc disease. Recommend close radiographic correlation as to vertebral body level prior to any fluoroscopically-guided procedure.  CBC from 12/29/2016 showed white count of 4.6, H&H of 12.7/38.1 and a platelet count of 208. CMP was within normal limits including serum calcium of 9.2 and a normal total protein of 7.1. LDH was normal at 138. SPEP showed normal quantitative immunoglobulins and no M spike was observed but serum immunofixation showed free monoclonal gammopathy. Off note patient has had monoclonal gammopathy seen on  immunofixation in January 2018 IgG lambda as well  Repeat myeloma panel done in may 2018 did not show monoclonal protein in SPEP and IFE. Free light chain ratio was normal although kappa light chains were mildly elevated  Interval history- she is doing well for her age. Mainly reports pain in her bilateral legs which is wore in the morning. Also has pain around lateral aspect of her thighs. Has baseline neuropathy which hasnt changed  ECOG PS- 2 Pain scale- 5   Review of systems- Review of Systems  Constitutional: Negative for chills, fever, malaise/fatigue and weight loss.  HENT: Negative for congestion, ear discharge and nosebleeds.   Eyes: Negative for blurred vision.  Respiratory: Negative for cough, hemoptysis, sputum production, shortness of breath and wheezing.   Cardiovascular: Negative for chest pain, palpitations, orthopnea and claudication.  Gastrointestinal: Negative for abdominal pain, blood in stool, constipation, diarrhea, heartburn, melena, nausea and vomiting.  Genitourinary: Negative for dysuria, flank pain, frequency, hematuria and urgency.  Musculoskeletal: Positive for joint pain. Negative for back pain and myalgias.  Skin: Negative for rash.  Neurological: Negative for dizziness, tingling, focal weakness, seizures, weakness and headaches.  Endo/Heme/Allergies: Does not bruise/bleed easily.  Psychiatric/Behavioral: Negative for depression and suicidal ideas. The patient does not have insomnia.       Allergies  Allergen Reactions  . Amoxicillin   . Clindamycin/Lincomycin   . Doxycycline   . Levaquin [Levofloxacin In D5w]   . Mirabegron Nausea And Vomiting  . Nsaids Other (See Comments)    GI upset  . Oxycodone Nausea Only    Other reaction(s): Hallucination  . Phenergan [Promethazine Hcl]   . Singulair [Montelukast]   .  Vicodin [Hydrocodone-Acetaminophen]   . Zocor [Simvastatin]   . Zoloft  [Sertraline] Other (See Comments)  . Ampicillin Rash  . Bactrim  [Sulfamethoxazole-Trimethoprim] Rash  . Penicillins Rash    Ampicillin, Clindamycin, Doxycycline Levaquin-severe headache, muscle and joint aches, weak     Past Medical History:  Diagnosis Date  . Allergic rhinitis, cause unspecified   . Allergic rhinitis, cause unspecified   . Atherosclerosis of aorta ([place])   . Back pain    lumbar  . Degeneration of lumbar or lumbosacral intervertebral disc   . Diverticulitis of colon (without mention of hemorrhage)(562.11)   . Dizziness and giddiness   . Dysthymic disorder   . Headache(784.0)   . Myalgia and myositis, unspecified   . Osteoporosis, unspecified   . Other and unspecified hyperlipidemia   . Other diseases of nasal cavity and sinuses(478.19)   . Other specified disorders of bladder   . Syncope   . Trigger finger (acquired)   . Unspecified essential hypertension   . Unspecified sleep apnea   . Urinary frequency      Past Surgical History:  Procedure Laterality Date  . ABDOMINAL HYSTERECTOMY    . ADENOIDECTOMY    . APPENDECTOMY    . ARTHROSCOPIC KNEE SURGERY    . CARDIAC CATHETERIZATION     [facility]  . CARPAL TUNNEL RELEASE    . CATARACT EXTRACTION W/ INTRAOCULAR LENS IMPLANT & ANTERIOR VITRECTOMY, BILATERAL    . CHOLECYSTECTOMY    . R/O Lymph Nodes; Right Axilla    . ROTATOR CUFF REPAIR Right   . SPG block  03/20/2016   sphenopalatine ganglion and maxillary division of trigeminal nerve block block Dr. Manuella Ghazi  . TEMPORAL ARTERY BIOPSY / LIGATION    . TONSILLECTOMY    . TUBAL LIGATION      Social History   Socioeconomic History  . Marital status: Widowed    Spouse name: Not on file  . Number of children: Not on file  . Years of education: Not on file  . Highest education level: Not on file  Social Needs  . Financial resource strain: Not on file  . Food insecurity - worry: Not on file  . Food insecurity - inability: Not on file  . Transportation needs - medical: Not on file  . Transportation needs - non-medical: Not  on file  Occupational History  . Not on file  Tobacco Use  . Smoking status: Never Smoker  . Smokeless tobacco: Never Used  Substance and Sexual Activity  . Alcohol use: No  . Drug use: No  . Sexual activity: Not on file  Other Topics Concern  . Not on file  Social History Narrative  . Not on file    Family History  Problem Relation Age of Onset  . Heart disease Mother   . Depression Mother   . Colon polyps Mother   . Dementia Mother   . Heart disease Father   . Dementia Father   . Heart disease Brother   . Hypertension Brother   . Heart disease Brother   . Hypertension Brother   . Kidney cancer Neg Hx   . Bladder Cancer Neg Hx      Current Outpatient Medications:  .  acetaminophen (TYLENOL) 500 MG tablet, Take 500 mg by mouth 3 (three) times daily. , Disp: , Rfl:  .  alendronate (FOSAMAX) 70 MG tablet, TAKE ONE TABLET BY MOUTH EVERY WEEK AS DIRECTED, Disp: 12 tablet, Rfl: 1 .  ALPRAZolam (XANAX) 0.25 MG tablet,  Take 1 tablet (0.25 mg total) by mouth 2 (two) times daily as needed for anxiety., Disp: 30 tablet, Rfl: 5 .  aspirin EC 81 MG tablet, Take 81 mg by mouth daily., Disp: , Rfl:  .  atorvastatin (LIPITOR) 20 MG tablet, TAKE ONE (1) TABLET EACH DAY AT 6 IN THE EVENING, Disp: 90 tablet, Rfl: 3 .  azelastine (ASTELIN) 0.1 % nasal spray, Place 1 spray into both nostrils as needed for rhinitis. Use in each nostril as directed, Disp: 30 mL, Rfl: 1 .  azithromycin (ZITHROMAX) 500 MG tablet, Take 500 mg once by mouth., Disp: , Rfl:  .  calcium carbonate (TUMS - DOSED IN MG ELEMENTAL CALCIUM) 500 MG chewable tablet, Chew 1 tablet by mouth as needed. , Disp: , Rfl:  .  Calcium Carbonate-Vitamin D 600-400 MG-UNIT tablet, Take 1 tablet by mouth 2 (two) times daily. (Patient taking differently: Take 1 tablet by mouth daily. D-3 800iu), Disp: 180 tablet, Rfl: 1 .  cetirizine (ZYRTEC) 10 MG tablet, Take 10 mg daily by mouth., Disp: , Rfl:  .  citalopram (CELEXA) 40 MG tablet, Take  1 tablet (40 mg total) by mouth daily., Disp: 90 tablet, Rfl: 3 .  diphenhydrAMINE (BENADRYL) 25 MG tablet, Take 25 mg at bedtime as needed by mouth., Disp: , Rfl:  .  docusate sodium (COLACE) 100 MG capsule, Take 200 mg by mouth 2 (two) times daily. , Disp: , Rfl:  .  lisinopril (PRINIVIL,ZESTRIL) 5 MG tablet, TAKE ONE (1) TABLET EACH DAY, Disp: 90 tablet, Rfl: 3 .  Magnesium Oxide, Antacid, 500 MG CAPS, Take 1 tablet by mouth as needed., Disp: , Rfl:  .  Melatonin 1 MG TABS, Take 1 tablet by mouth as needed. , Disp: , Rfl:  .  Omega-3 Fatty Acids (FISH OIL) 1200 MG CAPS, Take 2 capsules by mouth daily. , Disp: , Rfl:  .  phenazopyridine (AZO-TABS) 95 MG tablet, Take 1 tablet (95 mg total) by mouth as needed., Disp: 30 tablet, Rfl: 3 .  polyethylene glycol powder (GLYCOLAX/MIRALAX) powder, Take 17 g by mouth 2 (two) times daily as needed., Disp: 3350 g, Rfl: 5 .  POTASSIUM GLUCONATE PO, Take 1 tablet by mouth daily with supper., Disp: , Rfl:  .  ranitidine (ZANTAC) 150 MG tablet, TAKE ONE TABLET TWICE DAILY, Disp: 180 tablet, Rfl: 1 .  Simethicone (GAS RELIEF PO), Take by mouth as needed. , Disp: , Rfl:  .  sodium chloride (OCEAN) 0.65 % nasal spray, Reported on 01/30/2016, Disp: , Rfl:  .  traMADol (ULTRAM) 50 MG tablet, TAKE ONE TABLET TWICE A DAY AS NEEDED, Disp: 180 tablet, Rfl: 1 .  topiramate (TOPAMAX) 25 MG tablet, Take by mouth. , Disp: , Rfl:   Physical exam:  Vitals:   09/25/17 1001 09/25/17 1003  BP:  111/60  Pulse:  (!) 58  Resp:  18  Temp:  98.2 F (36.8 C)  TempSrc:  Tympanic  Weight: 160 lb (72.6 kg)    Physical Exam  Constitutional: She is oriented to person, place, and time.  Elderly woman who ambulates with a walker  HENT:  Head: Normocephalic and atraumatic.  Eyes: EOM are normal. Pupils are equal, round, and reactive to light.  Neck: Normal range of motion.  Cardiovascular: Normal rate, regular rhythm and normal heart sounds.  Pulmonary/Chest: Effort normal and  breath sounds normal.  Abdominal: Soft. Bowel sounds are normal.  Neurological: She is alert and oriented to person, place, and time.  Skin:  Skin is warm and dry.     CMP Latest Ref Rng & Units 09/25/2017  Glucose 65 - 99 mg/dL 95  BUN 6 - 20 mg/dL 20  Creatinine 0.44 - 1.00 mg/dL 0.85  Sodium 135 - 145 mmol/L 134(L)  Potassium 3.5 - 5.1 mmol/L 4.2  Chloride 101 - 111 mmol/L 103  CO2 22 - 32 mmol/L 25  Calcium 8.9 - 10.3 mg/dL 9.1  Total Protein 6.5 - 8.1 g/dL 6.7  Total Bilirubin 0.3 - 1.2 mg/dL 0.5  Alkaline Phos 38 - 126 U/L 48  AST 15 - 41 U/L 19  ALT 14 - 54 U/L 17   CBC Latest Ref Rng & Units 09/25/2017  WBC 3.6 - 11.0 K/uL 4.7  Hemoglobin 12.0 - 16.0 g/dL 12.5  Hematocrit 35.0 - 47.0 % 36.2  Platelets 150 - 440 K/uL 183    Assessment and plan- Patient is a 83 y.o. female with IgG MGUS  Patient noted to have IgG lambda monoclonal protein on IFE in the past but not on spep. Repeat check in may 2018 did not show monoclonal protein in both SPEP and IFE. todays bloodwork is pending. She does not have any evidence of end organ damage. This can be monitored conservatively and is not related to her neuropathy. I will see her back in 1 years time with myeloma labs and if no evidence seen in spep and iFE, she does not need hematology follow up at that time   Visit Diagnosis 1. MGUS (monoclonal gammopathy of unknown significance)      Dr. Randa Evens, MD, MPH [facility] Pager- [phone] 09/25/2017 11:51 AM

## 2017-09-25 NOTE — Progress Notes (Signed)
Patient here for follow up with labs. She states that she is feeling well except for the usual aches and pains.

## 2017-09-28 LAB — KAPPA/LAMBDA LIGHT CHAINS
KAPPA, LAMDA LIGHT CHAIN RATIO: 1.37 (ref 0.26–1.65)
Kappa free light chain: 27 mg/L — ABNORMAL HIGH (ref 3.3–19.4)
LAMDA FREE LIGHT CHAINS: 19.7 mg/L (ref 5.7–26.3)

## 2017-09-29 LAB — MULTIPLE MYELOMA PANEL, SERUM
ALBUMIN SERPL ELPH-MCNC: 3.5 g/dL (ref 2.9–4.4)
ALPHA 1: 0.2 g/dL (ref 0.0–0.4)
ALPHA2 GLOB SERPL ELPH-MCNC: 0.7 g/dL (ref 0.4–1.0)
Albumin/Glob SerPl: 1.3 (ref 0.7–1.7)
B-GLOBULIN SERPL ELPH-MCNC: 1 g/dL (ref 0.7–1.3)
GAMMA GLOB SERPL ELPH-MCNC: 0.9 g/dL (ref 0.4–1.8)
GLOBULIN, TOTAL: 2.8 g/dL (ref 2.2–3.9)
IGG (IMMUNOGLOBIN G), SERUM: 905 mg/dL (ref 700–1600)
IgA: 203 mg/dL (ref 64–422)
IgM (Immunoglobulin M), Srm: 73 mg/dL (ref 26–217)
TOTAL PROTEIN ELP: 6.3 g/dL (ref 6.0–8.5)

## 2017-10-05 ENCOUNTER — Other Ambulatory Visit: Payer: Self-pay | Admitting: Physician Assistant

## 2017-10-05 DIAGNOSIS — K5900 Constipation, unspecified: Secondary | ICD-10-CM

## 2017-10-05 NOTE — Telephone Encounter (Signed)
Pharmacy requesting refills. Thanks!  

## 2017-10-27 ENCOUNTER — Other Ambulatory Visit: Payer: Self-pay | Admitting: Family Medicine

## 2017-10-27 DIAGNOSIS — M1990 Unspecified osteoarthritis, unspecified site: Secondary | ICD-10-CM

## 2017-10-27 NOTE — Telephone Encounter (Signed)
See refill request.

## 2017-10-29 NOTE — Telephone Encounter (Signed)
Called into [facility] 

## 2017-11-18 ENCOUNTER — Other Ambulatory Visit: Payer: Self-pay | Admitting: Physician Assistant

## 2017-11-18 DIAGNOSIS — K219 Gastro-esophageal reflux disease without esophagitis: Secondary | ICD-10-CM

## 2017-11-18 DIAGNOSIS — K449 Diaphragmatic hernia without obstruction or gangrene: Secondary | ICD-10-CM

## 2017-11-25 DIAGNOSIS — M5481 Occipital neuralgia: Secondary | ICD-10-CM | POA: Diagnosis not present

## 2017-11-25 DIAGNOSIS — M542 Cervicalgia: Secondary | ICD-10-CM | POA: Diagnosis not present

## 2017-12-28 ENCOUNTER — Other Ambulatory Visit: Payer: Self-pay | Admitting: Physician Assistant

## 2017-12-28 DIAGNOSIS — J324 Chronic pansinusitis: Secondary | ICD-10-CM

## 2017-12-28 DIAGNOSIS — M810 Age-related osteoporosis without current pathological fracture: Secondary | ICD-10-CM

## 2018-01-18 DIAGNOSIS — D472 Monoclonal gammopathy: Secondary | ICD-10-CM | POA: Diagnosis not present

## 2018-01-18 DIAGNOSIS — G609 Hereditary and idiopathic neuropathy, unspecified: Secondary | ICD-10-CM | POA: Diagnosis not present

## 2018-01-18 DIAGNOSIS — R251 Tremor, unspecified: Secondary | ICD-10-CM | POA: Diagnosis not present

## 2018-01-18 DIAGNOSIS — M5481 Occipital neuralgia: Secondary | ICD-10-CM | POA: Diagnosis not present

## 2018-01-18 DIAGNOSIS — R413 Other amnesia: Secondary | ICD-10-CM | POA: Diagnosis not present

## 2018-01-18 DIAGNOSIS — R42 Dizziness and giddiness: Secondary | ICD-10-CM | POA: Diagnosis not present

## 2018-01-18 DIAGNOSIS — G4733 Obstructive sleep apnea (adult) (pediatric): Secondary | ICD-10-CM | POA: Diagnosis not present

## 2018-01-18 DIAGNOSIS — R2689 Other abnormalities of gait and mobility: Secondary | ICD-10-CM | POA: Diagnosis not present

## 2018-02-04 ENCOUNTER — Ambulatory Visit: Payer: Medicare Other | Admitting: Physician Assistant

## 2018-02-08 ENCOUNTER — Encounter: Payer: Self-pay | Admitting: Physician Assistant

## 2018-02-08 ENCOUNTER — Ambulatory Visit (INDEPENDENT_AMBULATORY_CARE_PROVIDER_SITE_OTHER): Payer: Medicare Other | Admitting: Physician Assistant

## 2018-02-08 VITALS — BP 148/68 | HR 63 | Temp 98.3°F | Resp 16 | Wt 168.4 lb

## 2018-02-08 DIAGNOSIS — M4807 Spinal stenosis, lumbosacral region: Secondary | ICD-10-CM | POA: Diagnosis not present

## 2018-02-08 DIAGNOSIS — R600 Localized edema: Secondary | ICD-10-CM

## 2018-02-08 DIAGNOSIS — M1611 Unilateral primary osteoarthritis, right hip: Secondary | ICD-10-CM | POA: Diagnosis not present

## 2018-02-08 DIAGNOSIS — I10 Essential (primary) hypertension: Secondary | ICD-10-CM | POA: Diagnosis not present

## 2018-02-08 MED ORDER — HYDROCHLOROTHIAZIDE 12.5 MG PO CAPS: 12.5000 mg | ORAL_CAPSULE | Freq: Every day | ORAL | 1 refills | Status: DC

## 2018-02-08 NOTE — Patient Instructions (Signed)
Edema Edema is when you have too much fluid in your body or under your skin. Edema may make your legs, feet, and ankles swell up. Swelling is also common in looser tissues, like around your eyes. This is a common condition. It gets more common as you get older. There are many possible causes of edema. Eating too much salt (sodium) and being on your feet or sitting for a long time can cause edema in your legs, feet, and ankles. Hot weather may make edema worse. Edema is usually painless. Your skin may look swollen or shiny. Follow these instructions at home:  Keep the swollen body part raised (elevated) above the level of your heart when you are sitting or lying down.  Do not sit still or stand for a long time.  Do not wear tight clothes. Do not wear garters on your upper legs.  Exercise your legs. This can help the swelling go down.  Wear elastic bandages or support stockings as told by your doctor.  Eat a low-salt (low-sodium) diet to reduce fluid as told by your doctor.  Depending on the cause of your swelling, you may need to limit how much fluid you drink (fluid restriction).  Take over-the-counter and prescription medicines only as told by your doctor. Contact a doctor if:  Treatment is not working.  You have heart, liver, or kidney disease and have symptoms of edema.  You have sudden and unexplained weight gain. Get help right away if:  You have shortness of breath or chest pain.  You cannot breathe when you lie down.  You have pain, redness, or warmth in the swollen areas.  You have heart, liver, or kidney disease and get edema all of a sudden.  You have a fever and your symptoms get worse all of a sudden. Summary  Edema is when you have too much fluid in your body or under your skin.  Edema may make your legs, feet, and ankles swell up. Swelling is also common in looser tissues, like around your eyes.  Raise (elevate) the swollen body part above the level of your  heart when you are sitting or lying down.  Follow your doctor's instructions about diet and how much fluid you can drink (fluid restriction). This information is not intended to replace advice given to you by your health care provider. Make sure you discuss any questions you have with your health care provider. Document Released: 04/21/2008 Document Revised: 11/21/2016 Document Reviewed: 11/21/2016 Elsevier Interactive Patient Education  2017 Elsevier Inc.  

## 2018-02-08 NOTE — Progress Notes (Signed)
Patient: Helen Ramsey Female    DOB: 20-Jul-1933   84 y.o.   MRN: [id number] Visit Date: 02/08/2018  Today's Provider: Mar Daring, PA-C   No chief complaint on file.  Subjective:    HPI  Depression, Follow up:  The patient was last seen for Depression 3 months ago. Changes made since that visit include continue Celexa 40mg  daily and alprazolam prn.  She reports excellent compliance with treatment. She is not having side effects. Reports that she is feeling well. ------------------------------------------------------------------------  Hypertension, follow-up:  BP Readings from Last 3 Encounters:  02/08/18 (!) 148/68  09/25/17 111/60  07/28/17 132/78    She was last seen for hypertension 3 months ago.  BP at that visit was 132/78. Management since that visit includes Stable. Continue HCTZ 12.5mg , lisinopril 5mg . Per [name] patient was taken off the HCTZ 12.5mg  when she was having dizziness. Per patient she was also taken of the Gabapentin but she went back on the Gabapentin but not the HCTZ. She reports excellent compliance with treatment. She is not having side effects. She is adherent to low salt diet.   Outside blood pressures are 120's/60's. She is experiencing chest pressure/discomfort, lower extremity edema and swelling on her hands with some tingling.  Patient denies chest pain, claudication, fatigue, irregular heart beat, near-syncope and palpitations.   Cardiovascular risk factors include advanced age (older than 75 for men, 47 for women) and hypertension.     Weight trend: stable Wt Readings from Last 3 Encounters:  02/08/18 168 lb 6.4 oz (76.4 kg)  09/25/17 160 lb (72.6 kg)  07/28/17 162 lb (73.5 kg)   -----------------------------------------------------------------------      Allergies  Allergen Reactions  . Amoxicillin   . Clindamycin/Lincomycin   . Doxycycline   . Levaquin [Levofloxacin In D5w]   . Mirabegron Nausea And  Vomiting  . Nsaids Other (See Comments)    GI upset  . Oxycodone Nausea Only    Other reaction(s): Hallucination  . Phenergan [Promethazine Hcl]   . Singulair [Montelukast]   . Vicodin [Hydrocodone-Acetaminophen]   . Zocor [Simvastatin]   . Zoloft  [Sertraline] Other (See Comments)  . Ampicillin Rash  . Bactrim [Sulfamethoxazole-Trimethoprim] Rash  . Penicillins Rash    Ampicillin, Clindamycin, Doxycycline Levaquin-severe headache, muscle and joint aches, weak     Current Outpatient Medications:  .  acetaminophen (TYLENOL) 500 MG tablet, Take 500 mg by mouth 3 (three) times daily. , Disp: , Rfl:  .  alendronate (FOSAMAX) 70 MG tablet, TAKE ONE TABLET BY MOUTH EVERY WEEK AS DIRECTED, Disp: 12 tablet, Rfl: 3 .  ALPRAZolam (XANAX) 0.25 MG tablet, Take 1 tablet (0.25 mg total) by mouth 2 (two) times daily as needed for anxiety., Disp: 30 tablet, Rfl: 5 .  aspirin EC 81 MG tablet, Take 81 mg by mouth daily., Disp: , Rfl:  .  atorvastatin (LIPITOR) 20 MG tablet, TAKE ONE (1) TABLET EACH DAY AT 6 IN THE EVENING, Disp: 90 tablet, Rfl: 3 .  azelastine (ASTELIN) 0.1 % nasal spray, PUT ONE SPRAY INTO BOTH NOSTRILS AS NEEDED FOR RHINITIS., Disp: 30 mL, Rfl: 5 .  azithromycin (ZITHROMAX) 500 MG tablet, Take 500 mg once by mouth., Disp: , Rfl:  .  cetirizine (ZYRTEC) 10 MG tablet, Take 10 mg daily by mouth., Disp: , Rfl:  .  citalopram (CELEXA) 40 MG tablet, TAKE ONE (1) TABLET BY MOUTH EVERY DAY, Disp: 90 tablet, Rfl: 1 .  docusate sodium (COLACE) 100  MG capsule, Take 200 mg by mouth 2 (two) times daily as needed for mild constipation. , Disp: , Rfl:  .  lisinopril (PRINIVIL,ZESTRIL) 5 MG tablet, TAKE ONE (1) TABLET EACH DAY, Disp: 90 tablet, Rfl: 3 .  Magnesium Oxide, Antacid, 500 MG CAPS, Take 1 tablet by mouth as needed., Disp: , Rfl:  .  Melatonin 1 MG TABS, Take 1 tablet by mouth as needed. , Disp: , Rfl:  .  phenazopyridine (AZO-TABS) 95 MG tablet, Take 1 tablet (95 mg total) by mouth as  needed., Disp: 30 tablet, Rfl: 3 .  polyethylene glycol powder (GLYCOLAX/MIRALAX) powder, Take 17 g by mouth 2 (two) times daily as needed., Disp: 527 g, Rfl: 1 .  POTASSIUM GLUCONATE PO, Take 1 tablet by mouth daily with supper., Disp: , Rfl:  .  ranitidine (ZANTAC) 150 MG tablet, TAKE ONE TABLET TWICE DAILY, Disp: 180 tablet, Rfl: 1 .  Simethicone (GAS RELIEF PO), Take by mouth as needed. , Disp: , Rfl:  .  sodium chloride (OCEAN) 0.65 % nasal spray, Reported on 01/30/2016, Disp: , Rfl:  .  traMADol (ULTRAM) 50 MG tablet, TAKE ONE TABLET BY MOUTH TWO TIMES A DAY AS NEEDED, Disp: 180 tablet, Rfl: 1 .  Calcium Carbonate-Vitamin D 600-400 MG-UNIT tablet, Take 1 tablet by mouth 2 (two) times daily. (Patient taking differently: Take 1 tablet by mouth daily. D-3 800iu), Disp: 180 tablet, Rfl: 1 .  Omega-3 Fatty Acids (FISH OIL) 1200 MG CAPS, Take 2 capsules by mouth daily. , Disp: , Rfl:   Review of Systems  Constitutional: Negative for fatigue.  Respiratory: Negative for chest tightness, shortness of breath and wheezing.   Cardiovascular: Positive for leg swelling. Negative for palpitations.       "Pressure of and on for several month"  "worsening in the last few weeks"   Gastrointestinal: Negative.   Musculoskeletal: Positive for arthralgias (right hip), back pain and gait problem.  Neurological: Positive for numbness. Negative for dizziness, light-headedness and headaches.       Tingling in her legs    Social History   Tobacco Use  . Smoking status: Never Smoker  . Smokeless tobacco: Never Used  Substance Use Topics  . Alcohol use: No   Objective:   BP (!) 148/68 (BP Location: Left Arm, Patient Position: Sitting, Cuff Size: Normal)   Pulse 63   Temp 98.3 F (36.8 C) (Oral)   Resp 16   Wt 168 lb 6.4 oz (76.4 kg)   SpO2 98%   BMI 34.01 kg/m    Physical Exam  Constitutional: She appears well-developed and well-nourished. No distress.  Neck: Normal range of motion. Neck supple.    Cardiovascular: Normal rate, regular rhythm and normal heart sounds. Exam reveals no gallop and no friction rub.  No murmur heard. Pulmonary/Chest: Effort normal and breath sounds normal. No respiratory distress. She has no wheezes. She has no rales.  Musculoskeletal: She exhibits edema (trace to 1+ pitting edema bilaterally).  Skin: She is not diaphoretic.  Psychiatric: She has a normal mood and affect. Her behavior is normal. Judgment and thought content normal.  Vitals reviewed.       Assessment & Plan:     1. Essential hypertension Will add HCTZ back per patient request for fluid. Advised patient to monitor BP and make sure not dropping too low or causing dizziness again. I will see her back in 3 months and recheck all labs.  - hydrochlorothiazide (MICROZIDE) 12.5 MG capsule; Take 1 capsule (  12.5 mg total) by mouth daily.  Dispense: 90 capsule; Refill: 1  2. Bilateral lower extremity edema See above medical treatment plan. - hydrochlorothiazide (MICROZIDE) 12.5 MG capsule; Take 1 capsule (12.5 mg total) by mouth daily.  Dispense: 90 capsule; Refill: 1  3. Primary osteoarthritis of right hip Worsening. Discussed her following back up with [facility] orthopedics, whom she has been followed by for her other arthralgias in the past, and let them know her right hip pain is worsening.   4. Spinal stenosis of lumbosacral region Worsening as well. She is going to see [facility] first and may go back to seeing Dr. Sharlet Salina, has not seen him since 2016.        Mar Daring, PA-C  [facility]

## 2018-02-25 DIAGNOSIS — M25551 Pain in right hip: Secondary | ICD-10-CM | POA: Diagnosis not present

## 2018-02-25 DIAGNOSIS — M48062 Spinal stenosis, lumbar region with neurogenic claudication: Secondary | ICD-10-CM | POA: Diagnosis not present

## 2018-02-25 DIAGNOSIS — M7061 Trochanteric bursitis, right hip: Secondary | ICD-10-CM | POA: Diagnosis not present

## 2018-03-18 DIAGNOSIS — M5416 Radiculopathy, lumbar region: Secondary | ICD-10-CM | POA: Diagnosis not present

## 2018-03-18 DIAGNOSIS — M5136 Other intervertebral disc degeneration, lumbar region: Secondary | ICD-10-CM | POA: Diagnosis not present

## 2018-03-19 ENCOUNTER — Telehealth: Payer: Self-pay | Admitting: Physician Assistant

## 2018-03-19 NOTE — Telephone Encounter (Signed)
Pt is asking when her legs swell can she take 2 capsule hydrochlorothiazide (MICROZIDE) 12.5 MG capsule instead of 1 capsule per day and when her legs aren't swelling take her regular dose of 1 capsule a day. Pt stated that her legs are swollen today and her BP is good per pt. Pt stated BP has been around 118/66 Please advise. Thanks TNP

## 2018-03-19 NOTE — Telephone Encounter (Signed)
Advised patient as below.  

## 2018-03-19 NOTE — Telephone Encounter (Signed)
Yes this is ok. Monitor BP. If drops below 100/60 do not take the second one.

## 2018-04-13 ENCOUNTER — Other Ambulatory Visit: Payer: Self-pay | Admitting: Physical Medicine and Rehabilitation

## 2018-04-13 DIAGNOSIS — M5416 Radiculopathy, lumbar region: Secondary | ICD-10-CM

## 2018-04-13 DIAGNOSIS — Z96651 Presence of right artificial knee joint: Secondary | ICD-10-CM | POA: Diagnosis not present

## 2018-04-13 DIAGNOSIS — M5136 Other intervertebral disc degeneration, lumbar region: Secondary | ICD-10-CM | POA: Diagnosis not present

## 2018-04-19 DIAGNOSIS — M5416 Radiculopathy, lumbar region: Secondary | ICD-10-CM | POA: Diagnosis not present

## 2018-04-19 DIAGNOSIS — M1991 Primary osteoarthritis, unspecified site: Secondary | ICD-10-CM | POA: Diagnosis not present

## 2018-04-19 DIAGNOSIS — F419 Anxiety disorder, unspecified: Secondary | ICD-10-CM | POA: Diagnosis not present

## 2018-04-19 DIAGNOSIS — I10 Essential (primary) hypertension: Secondary | ICD-10-CM | POA: Diagnosis not present

## 2018-04-19 DIAGNOSIS — M5136 Other intervertebral disc degeneration, lumbar region: Secondary | ICD-10-CM | POA: Diagnosis not present

## 2018-04-19 DIAGNOSIS — G629 Polyneuropathy, unspecified: Secondary | ICD-10-CM | POA: Diagnosis not present

## 2018-04-20 DIAGNOSIS — R251 Tremor, unspecified: Secondary | ICD-10-CM | POA: Diagnosis not present

## 2018-04-20 DIAGNOSIS — G4733 Obstructive sleep apnea (adult) (pediatric): Secondary | ICD-10-CM | POA: Diagnosis not present

## 2018-04-20 DIAGNOSIS — R2689 Other abnormalities of gait and mobility: Secondary | ICD-10-CM | POA: Diagnosis not present

## 2018-04-20 DIAGNOSIS — R413 Other amnesia: Secondary | ICD-10-CM | POA: Diagnosis not present

## 2018-04-20 DIAGNOSIS — M5481 Occipital neuralgia: Secondary | ICD-10-CM | POA: Diagnosis not present

## 2018-04-20 DIAGNOSIS — R42 Dizziness and giddiness: Secondary | ICD-10-CM | POA: Diagnosis not present

## 2018-04-20 DIAGNOSIS — D472 Monoclonal gammopathy: Secondary | ICD-10-CM | POA: Diagnosis not present

## 2018-04-22 DIAGNOSIS — M1991 Primary osteoarthritis, unspecified site: Secondary | ICD-10-CM | POA: Diagnosis not present

## 2018-04-22 DIAGNOSIS — M5136 Other intervertebral disc degeneration, lumbar region: Secondary | ICD-10-CM | POA: Diagnosis not present

## 2018-04-22 DIAGNOSIS — G629 Polyneuropathy, unspecified: Secondary | ICD-10-CM | POA: Diagnosis not present

## 2018-04-22 DIAGNOSIS — F419 Anxiety disorder, unspecified: Secondary | ICD-10-CM | POA: Diagnosis not present

## 2018-04-22 DIAGNOSIS — M5416 Radiculopathy, lumbar region: Secondary | ICD-10-CM | POA: Diagnosis not present

## 2018-04-22 DIAGNOSIS — I10 Essential (primary) hypertension: Secondary | ICD-10-CM | POA: Diagnosis not present

## 2018-04-26 DIAGNOSIS — M5136 Other intervertebral disc degeneration, lumbar region: Secondary | ICD-10-CM | POA: Diagnosis not present

## 2018-04-26 DIAGNOSIS — G629 Polyneuropathy, unspecified: Secondary | ICD-10-CM | POA: Diagnosis not present

## 2018-04-26 DIAGNOSIS — I10 Essential (primary) hypertension: Secondary | ICD-10-CM | POA: Diagnosis not present

## 2018-04-26 DIAGNOSIS — F419 Anxiety disorder, unspecified: Secondary | ICD-10-CM | POA: Diagnosis not present

## 2018-04-26 DIAGNOSIS — M1991 Primary osteoarthritis, unspecified site: Secondary | ICD-10-CM | POA: Diagnosis not present

## 2018-04-26 DIAGNOSIS — M5416 Radiculopathy, lumbar region: Secondary | ICD-10-CM | POA: Diagnosis not present

## 2018-04-27 ENCOUNTER — Ambulatory Visit: Payer: Medicare Other

## 2018-04-27 DIAGNOSIS — M1991 Primary osteoarthritis, unspecified site: Secondary | ICD-10-CM | POA: Diagnosis not present

## 2018-04-27 DIAGNOSIS — M5136 Other intervertebral disc degeneration, lumbar region: Secondary | ICD-10-CM | POA: Diagnosis not present

## 2018-04-27 DIAGNOSIS — I10 Essential (primary) hypertension: Secondary | ICD-10-CM | POA: Diagnosis not present

## 2018-04-27 DIAGNOSIS — M5416 Radiculopathy, lumbar region: Secondary | ICD-10-CM | POA: Diagnosis not present

## 2018-04-27 DIAGNOSIS — F419 Anxiety disorder, unspecified: Secondary | ICD-10-CM | POA: Diagnosis not present

## 2018-04-27 DIAGNOSIS — G629 Polyneuropathy, unspecified: Secondary | ICD-10-CM | POA: Diagnosis not present

## 2018-04-29 DIAGNOSIS — M1991 Primary osteoarthritis, unspecified site: Secondary | ICD-10-CM | POA: Diagnosis not present

## 2018-04-29 DIAGNOSIS — M5136 Other intervertebral disc degeneration, lumbar region: Secondary | ICD-10-CM | POA: Diagnosis not present

## 2018-04-29 DIAGNOSIS — F419 Anxiety disorder, unspecified: Secondary | ICD-10-CM | POA: Diagnosis not present

## 2018-04-29 DIAGNOSIS — I10 Essential (primary) hypertension: Secondary | ICD-10-CM | POA: Diagnosis not present

## 2018-04-29 DIAGNOSIS — G629 Polyneuropathy, unspecified: Secondary | ICD-10-CM | POA: Diagnosis not present

## 2018-04-29 DIAGNOSIS — M5416 Radiculopathy, lumbar region: Secondary | ICD-10-CM | POA: Diagnosis not present

## 2018-04-30 ENCOUNTER — Ambulatory Visit
Admission: RE | Admit: 2018-04-30 | Discharge: 2018-04-30 | Disposition: A | Discharge status: Home / Self Care | Payer: Medicare Other | Source: Ambulatory Visit | Attending: Physical Medicine and Rehabilitation | Admitting: Physical Medicine and Rehabilitation

## 2018-04-30 DIAGNOSIS — M545 Low back pain: Secondary | ICD-10-CM | POA: Diagnosis not present

## 2018-04-30 DIAGNOSIS — M5416 Radiculopathy, lumbar region: Secondary | ICD-10-CM | POA: Diagnosis not present

## 2018-04-30 DIAGNOSIS — M2548 Effusion, other site: Secondary | ICD-10-CM | POA: Insufficient documentation

## 2018-04-30 DIAGNOSIS — M4316 Spondylolisthesis, lumbar region: Secondary | ICD-10-CM | POA: Diagnosis not present

## 2018-05-03 ENCOUNTER — Other Ambulatory Visit: Payer: Self-pay | Admitting: Physician Assistant

## 2018-05-03 DIAGNOSIS — M5416 Radiculopathy, lumbar region: Secondary | ICD-10-CM | POA: Diagnosis not present

## 2018-05-03 DIAGNOSIS — M5136 Other intervertebral disc degeneration, lumbar region: Secondary | ICD-10-CM | POA: Diagnosis not present

## 2018-05-03 DIAGNOSIS — M1991 Primary osteoarthritis, unspecified site: Secondary | ICD-10-CM | POA: Diagnosis not present

## 2018-05-03 DIAGNOSIS — I10 Essential (primary) hypertension: Secondary | ICD-10-CM | POA: Diagnosis not present

## 2018-05-03 DIAGNOSIS — M1990 Unspecified osteoarthritis, unspecified site: Secondary | ICD-10-CM

## 2018-05-03 DIAGNOSIS — F419 Anxiety disorder, unspecified: Secondary | ICD-10-CM | POA: Diagnosis not present

## 2018-05-03 DIAGNOSIS — G629 Polyneuropathy, unspecified: Secondary | ICD-10-CM | POA: Diagnosis not present

## 2018-05-04 DIAGNOSIS — I10 Essential (primary) hypertension: Secondary | ICD-10-CM | POA: Diagnosis not present

## 2018-05-04 DIAGNOSIS — G629 Polyneuropathy, unspecified: Secondary | ICD-10-CM | POA: Diagnosis not present

## 2018-05-04 DIAGNOSIS — M5416 Radiculopathy, lumbar region: Secondary | ICD-10-CM | POA: Diagnosis not present

## 2018-05-04 DIAGNOSIS — M5136 Other intervertebral disc degeneration, lumbar region: Secondary | ICD-10-CM | POA: Diagnosis not present

## 2018-05-04 DIAGNOSIS — M1991 Primary osteoarthritis, unspecified site: Secondary | ICD-10-CM | POA: Diagnosis not present

## 2018-05-04 DIAGNOSIS — F419 Anxiety disorder, unspecified: Secondary | ICD-10-CM | POA: Diagnosis not present

## 2018-05-05 DIAGNOSIS — M5136 Other intervertebral disc degeneration, lumbar region: Secondary | ICD-10-CM | POA: Diagnosis not present

## 2018-05-05 DIAGNOSIS — G629 Polyneuropathy, unspecified: Secondary | ICD-10-CM | POA: Diagnosis not present

## 2018-05-05 DIAGNOSIS — M5416 Radiculopathy, lumbar region: Secondary | ICD-10-CM | POA: Diagnosis not present

## 2018-05-05 DIAGNOSIS — F419 Anxiety disorder, unspecified: Secondary | ICD-10-CM | POA: Diagnosis not present

## 2018-05-05 DIAGNOSIS — M1991 Primary osteoarthritis, unspecified site: Secondary | ICD-10-CM | POA: Diagnosis not present

## 2018-05-05 DIAGNOSIS — I10 Essential (primary) hypertension: Secondary | ICD-10-CM | POA: Diagnosis not present

## 2018-05-06 DIAGNOSIS — M48062 Spinal stenosis, lumbar region with neurogenic claudication: Secondary | ICD-10-CM | POA: Diagnosis not present

## 2018-05-06 DIAGNOSIS — M5416 Radiculopathy, lumbar region: Secondary | ICD-10-CM | POA: Diagnosis not present

## 2018-05-06 DIAGNOSIS — M5136 Other intervertebral disc degeneration, lumbar region: Secondary | ICD-10-CM | POA: Diagnosis not present

## 2018-05-10 DIAGNOSIS — M1991 Primary osteoarthritis, unspecified site: Secondary | ICD-10-CM | POA: Diagnosis not present

## 2018-05-10 DIAGNOSIS — I10 Essential (primary) hypertension: Secondary | ICD-10-CM | POA: Diagnosis not present

## 2018-05-10 DIAGNOSIS — M5416 Radiculopathy, lumbar region: Secondary | ICD-10-CM | POA: Diagnosis not present

## 2018-05-10 DIAGNOSIS — F419 Anxiety disorder, unspecified: Secondary | ICD-10-CM | POA: Diagnosis not present

## 2018-05-10 DIAGNOSIS — G629 Polyneuropathy, unspecified: Secondary | ICD-10-CM | POA: Diagnosis not present

## 2018-05-10 DIAGNOSIS — M5136 Other intervertebral disc degeneration, lumbar region: Secondary | ICD-10-CM | POA: Diagnosis not present

## 2018-05-11 ENCOUNTER — Encounter: Payer: Self-pay | Admitting: Physician Assistant

## 2018-05-11 ENCOUNTER — Ambulatory Visit (INDEPENDENT_AMBULATORY_CARE_PROVIDER_SITE_OTHER): Payer: Medicare Other | Admitting: Physician Assistant

## 2018-05-11 VITALS — BP 120/66 | HR 63 | Temp 98.3°F | Resp 16 | Wt 171.0 lb

## 2018-05-11 DIAGNOSIS — Z6834 Body mass index (BMI) 34.0-34.9, adult: Secondary | ICD-10-CM | POA: Diagnosis not present

## 2018-05-11 DIAGNOSIS — I10 Essential (primary) hypertension: Secondary | ICD-10-CM

## 2018-05-11 DIAGNOSIS — K219 Gastro-esophageal reflux disease without esophagitis: Secondary | ICD-10-CM | POA: Diagnosis not present

## 2018-05-11 DIAGNOSIS — M5481 Occipital neuralgia: Secondary | ICD-10-CM | POA: Diagnosis not present

## 2018-05-11 DIAGNOSIS — F324 Major depressive disorder, single episode, in partial remission: Secondary | ICD-10-CM | POA: Diagnosis not present

## 2018-05-11 DIAGNOSIS — M5136 Other intervertebral disc degeneration, lumbar region: Secondary | ICD-10-CM | POA: Diagnosis not present

## 2018-05-11 DIAGNOSIS — E559 Vitamin D deficiency, unspecified: Secondary | ICD-10-CM | POA: Diagnosis not present

## 2018-05-11 DIAGNOSIS — M4807 Spinal stenosis, lumbosacral region: Secondary | ICD-10-CM

## 2018-05-11 DIAGNOSIS — E7849 Other hyperlipidemia: Secondary | ICD-10-CM | POA: Diagnosis not present

## 2018-05-11 MED ORDER — OMEPRAZOLE 20 MG PO CPDR: 20.0000 mg | DELAYED_RELEASE_CAPSULE | Freq: Every day | ORAL | 1 refills | Status: DC

## 2018-05-11 MED ORDER — OMEPRAZOLE 20 MG PO CPDR
20.0000 mg | DELAYED_RELEASE_CAPSULE | Freq: Every day | ORAL | 1 refills | Status: DC
Start: 2018-05-11 — End: 2018-05-11

## 2018-05-11 NOTE — Progress Notes (Signed)
Patient: Helen Ramsey Female    DOB: [birth year]   84 y.o.   MRN: [id number] Visit Date: 05/11/2018  Today's Provider: Mar Daring, PA-C   Chief Complaint  Patient presents with  . Hypertension   Subjective:    HPI  Follow up for hypertension  The patient was last seen for this 3 months ago. Changes made at last visit include increase HCTZ to 25 mg daily.  She reports excellent compliance with treatment. She feels that condition is Improved. She is not having side effects. Patient reports she is still having some swelling around her ankles. ------------------------------------------------------------------------------------    Allergies  Allergen Reactions  . Amoxicillin   . Clindamycin/Lincomycin   . Doxycycline   . Levaquin [Levofloxacin In D5w]   . Mirabegron Nausea And Vomiting  . Nsaids Other (See Comments)    GI upset  . Oxycodone Nausea Only    Other reaction(s): Hallucination  . Phenergan [Promethazine Hcl]   . Singulair [Montelukast]   . Vicodin [Hydrocodone-Acetaminophen]   . Zocor [Simvastatin]   . Zoloft  [Sertraline] Other (See Comments)  . Ampicillin Rash  . Bactrim [Sulfamethoxazole-Trimethoprim] Rash  . Penicillins Rash    Ampicillin, Clindamycin, Doxycycline Levaquin-severe headache, muscle and joint aches, weak  . Tolmetin     Other reaction(s): Other (See Comments) GI upset     Current Outpatient Medications:  .  acetaminophen (TYLENOL) 500 MG tablet, Take 500 mg by mouth 3 (three) times daily. , Disp: , Rfl:  .  alendronate (FOSAMAX) 70 MG tablet, TAKE ONE TABLET BY MOUTH EVERY WEEK AS DIRECTED, Disp: 12 tablet, Rfl: 3 .  ALPRAZolam (XANAX) 0.25 MG tablet, Take 1 tablet (0.25 mg total) by mouth 2 (two) times daily as needed for anxiety., Disp: 30 tablet, Rfl: 5 .  aspirin EC 81 MG tablet, Take 81 mg by mouth daily., Disp: , Rfl:  .  atorvastatin (LIPITOR) 20 MG tablet, TAKE ONE (1) TABLET EACH DAY AT 6 IN THE EVENING,  Disp: 90 tablet, Rfl: 3 .  azelastine (ASTELIN) 0.1 % nasal spray, PUT ONE SPRAY INTO BOTH NOSTRILS AS NEEDED FOR RHINITIS., Disp: 30 mL, Rfl: 5 .  Calcium Carbonate-Vitamin D 600-400 MG-UNIT tablet, Take 1 tablet by mouth 2 (two) times daily. (Patient taking differently: Take 1 tablet by mouth daily. D-3 800iu), Disp: 180 tablet, Rfl: 1 .  cetirizine (ZYRTEC) 10 MG tablet, Take 10 mg daily by mouth., Disp: , Rfl:  .  citalopram (CELEXA) 40 MG tablet, TAKE ONE (1) TABLET BY MOUTH EVERY DAY, Disp: 90 tablet, Rfl: 1 .  cyanocobalamin 1000 MCG tablet, Take 1,000 mcg by mouth daily., Disp: , Rfl:  .  Dentifrices (FLUORIDE TOOTHPASTE DT), Place onto teeth., Disp: , Rfl:  .  docusate sodium (COLACE) 100 MG capsule, Take 200 mg by mouth 2 (two) times daily as needed for mild constipation. , Disp: , Rfl:  .  gabapentin (NEURONTIN) 100 MG capsule, Start Gabapentin 100 mg (one tablet) in the morning, 100 mg in the afternoon (one tablet), and 200 mg (two tablets) in the evening., Disp: , Rfl:  .  hydrochlorothiazide (MICROZIDE) 12.5 MG capsule, Take 1 capsule (12.5 mg total) by mouth daily. (Patient taking differently: Take 25 mg by mouth daily. ), Disp: 90 capsule, Rfl: 1 .  lisinopril (PRINIVIL,ZESTRIL) 5 MG tablet, TAKE ONE (1) TABLET EACH DAY, Disp: 90 tablet, Rfl: 3 .  Magnesium Oxide, Antacid, 500 MG CAPS, Take 1 tablet by mouth as needed.,  Disp: , Rfl:  .  Melatonin 1 MG TABS, Take 1 tablet by mouth as needed. , Disp: , Rfl:  .  Omega-3 Fatty Acids (FISH OIL) 1200 MG CAPS, Take 2 capsules by mouth daily. , Disp: , Rfl:  .  phenazopyridine (AZO-TABS) 95 MG tablet, Take 1 tablet (95 mg total) by mouth as needed., Disp: 30 tablet, Rfl: 3 .  polyethylene glycol powder (GLYCOLAX/MIRALAX) powder, Take 17 g by mouth 2 (two) times daily as needed., Disp: 527 g, Rfl: 1 .  POTASSIUM GLUCONATE PO, Take 1 tablet by mouth daily with supper., Disp: , Rfl:  .  ranitidine (ZANTAC) 150 MG tablet, TAKE ONE TABLET TWICE  DAILY, Disp: 180 tablet, Rfl: 1 .  Simethicone (GAS RELIEF PO), Take by mouth as needed. , Disp: , Rfl:  .  sodium chloride (OCEAN) 0.65 % nasal spray, Reported on 01/30/2016, Disp: , Rfl:  .  traMADol (ULTRAM) 50 MG tablet, TAKE ONE TABLET BY MOUTH TWO TIMES A DAY AS NEEDED (Patient taking differently: TAKE ONE TABLET BY MOUTH TWO TIMES A DAY), Disp: 180 tablet, Rfl: 1 .  azithromycin (ZITHROMAX) 500 MG tablet, Take 500 mg once by mouth., Disp: , Rfl:   Review of Systems  Constitutional: Negative.   HENT: Negative.   Respiratory: Positive for cough and shortness of breath.   Cardiovascular: Positive for leg swelling.  Genitourinary: Negative.   Neurological: Positive for headaches.    Social History   Tobacco Use  . Smoking status: Never Smoker  . Smokeless tobacco: Never Used  Substance Use Topics  . Alcohol use: No   Objective:   BP 120/66 (BP Location: Left Arm, Patient Position: Sitting, Cuff Size: Normal)   Pulse 63   Temp 98.3 F (36.8 C) (Oral)   Resp 16   Wt 171 lb (77.6 kg)   SpO2 98%   BMI 34.54 kg/m  Vitals:   05/11/18 0944  BP: 120/66  Pulse: 63  Resp: 16  Temp: 98.3 F (36.8 C)  TempSrc: Oral  SpO2: 98%  Weight: 171 lb (77.6 kg)     Physical Exam  Constitutional: She appears well-developed and well-nourished. No distress.  Neck: Normal range of motion. Neck supple. No JVD present. No tracheal deviation present. No thyromegaly present.  Cardiovascular: Normal rate and regular rhythm. Exam reveals no gallop and no friction rub.  Murmur heard. Pulmonary/Chest: Effort normal and breath sounds normal. No respiratory distress. She has no wheezes. She has no rales.  Musculoskeletal: She exhibits edema (2+ non pitting edema bilat).  Lymphadenopathy:    She has no cervical adenopathy.  Skin: She is not diaphoretic.  Vitals reviewed.      Assessment & Plan:     1. Gastroesophageal reflux disease without esophagitis Stable. Diagnosis pulled for  medication refill. Continue current medical treatment plan. - omeprazole (PRILOSEC) 20 MG capsule; Take 1 capsule (20 mg total) by mouth daily.  Dispense: 90 capsule; Refill: 1  2. Essential (primary) hypertension At home readings are improved. Continue lisinopril 5 mg and HCTZ 25mg . Will check labs as below and f/u pending results. - CBC w/Diff/Platelet - Comprehensive Metabolic Panel (CMET) - HgB A1c  3. Bilateral occipital neuralgia Worsening. Has had headache since Sunday described as starting in the neck and radiates to the top of her head. Advised to f/u with Dr. Manuella Ghazi.   4. BMI 34.0-34.9,adult Counseled patient on healthy lifestyle modifications including dieting and exercise.  - CBC w/Diff/Platelet - HgB A1c  5. Major depressive disorder  with single episode, in partial remission ([place]) Stable. Continue Citalopram and xanax.   6. DDD (degenerative disc disease), lumbar Worsening. Followed by Dr. Sharlet Salina. May be due for repeat ESI.   7. Familial multiple lipoprotein-type hyperlipidemia Stable on atorvastatin. Will check labs as below and f/u pending results. - CBC w/Diff/Platelet - HgB A1c - Lipid Profile  8. Spinal stenosis of lumbosacral region Followed by Dr. Sharlet Salina.   9. Vitamin D deficiency H/O this. Takes OTC supplement. Will check labs as below and f/u pending results. - CBC w/Diff/Platelet - Vitamin D (25 hydroxy)       Mar Daring, PA-C  [facility]

## 2018-05-12 ENCOUNTER — Other Ambulatory Visit: Payer: Self-pay | Admitting: Physician Assistant

## 2018-05-12 ENCOUNTER — Telehealth: Payer: Self-pay

## 2018-05-12 ENCOUNTER — Encounter: Payer: Self-pay | Admitting: Physician Assistant

## 2018-05-12 DIAGNOSIS — F419 Anxiety disorder, unspecified: Secondary | ICD-10-CM | POA: Diagnosis not present

## 2018-05-12 DIAGNOSIS — E785 Hyperlipidemia, unspecified: Secondary | ICD-10-CM

## 2018-05-12 DIAGNOSIS — M1991 Primary osteoarthritis, unspecified site: Secondary | ICD-10-CM | POA: Diagnosis not present

## 2018-05-12 DIAGNOSIS — G629 Polyneuropathy, unspecified: Secondary | ICD-10-CM | POA: Diagnosis not present

## 2018-05-12 DIAGNOSIS — I10 Essential (primary) hypertension: Secondary | ICD-10-CM | POA: Diagnosis not present

## 2018-05-12 DIAGNOSIS — M5136 Other intervertebral disc degeneration, lumbar region: Secondary | ICD-10-CM | POA: Diagnosis not present

## 2018-05-12 DIAGNOSIS — F411 Generalized anxiety disorder: Secondary | ICD-10-CM

## 2018-05-12 DIAGNOSIS — M5416 Radiculopathy, lumbar region: Secondary | ICD-10-CM | POA: Diagnosis not present

## 2018-05-12 LAB — CBC WITH DIFFERENTIAL/PLATELET
BASOS ABS: 0 10*3/uL (ref 0.0–0.2)
BASOS: 0 %
EOS (ABSOLUTE): 0.4 10*3/uL (ref 0.0–0.4)
Eos: 7 %
Hematocrit: 36.7 % (ref 34.0–46.6)
Hemoglobin: 12.9 g/dL (ref 11.1–15.9)
IMMATURE GRANS (ABS): 0.1 10*3/uL (ref 0.0–0.1)
IMMATURE GRANULOCYTES: 1 %
LYMPHS: 25 %
Lymphocytes Absolute: 1.5 10*3/uL (ref 0.7–3.1)
MCH: 31.5 pg (ref 26.6–33.0)
MCHC: 35.1 g/dL (ref 31.5–35.7)
MCV: 90 fL (ref 79–97)
Monocytes Absolute: 0.6 10*3/uL (ref 0.1–0.9)
Monocytes: 10 %
NEUTROS PCT: 57 %
Neutrophils Absolute: 3.3 10*3/uL (ref 1.4–7.0)
Platelets: 213 10*3/uL (ref 150–450)
RBC: 4.09 x10E6/uL (ref 3.77–5.28)
RDW: 14.3 % (ref 12.3–15.4)
WBC: 5.9 10*3/uL (ref 3.4–10.8)

## 2018-05-12 LAB — COMPREHENSIVE METABOLIC PANEL
ALT: 26 IU/L (ref 0–32)
AST: 22 IU/L (ref 0–40)
Albumin/Globulin Ratio: 2.2 (ref 1.2–2.2)
Albumin: 4.4 g/dL (ref 3.5–4.7)
Alkaline Phosphatase: 51 IU/L (ref 39–117)
BUN/Creatinine Ratio: 22 (ref 12–28)
BUN: 16 mg/dL (ref 8–27)
Bilirubin Total: 0.4 mg/dL (ref 0.0–1.2)
CALCIUM: 9.6 mg/dL (ref 8.7–10.3)
CHLORIDE: 101 mmol/L (ref 96–106)
CO2: 25 mmol/L (ref 20–29)
Creatinine, Ser: 0.72 mg/dL (ref 0.57–1.00)
GFR, EST AFRICAN AMERICAN: 90 mL/min/{1.73_m2} (ref >59)
GFR, EST NON AFRICAN AMERICAN: 78 mL/min/{1.73_m2} (ref >59)
GLUCOSE: 93 mg/dL (ref 65–99)
Globulin, Total: 2 g/dL (ref 1.5–4.5)
Potassium: 4.2 mmol/L (ref 3.5–5.2)
Sodium: 139 mmol/L (ref 134–144)
TOTAL PROTEIN: 6.4 g/dL (ref 6.0–8.5)

## 2018-05-12 LAB — LIPID PANEL
Chol/HDL Ratio: 4 ratio (ref 0.0–4.4)
Cholesterol, Total: 166 mg/dL (ref 100–199)
HDL: 41 mg/dL (ref >39)
LDL CALC: 87 mg/dL (ref 0–99)
Triglycerides: 191 mg/dL — ABNORMAL HIGH (ref 0–149)
VLDL Cholesterol Cal: 38 mg/dL (ref 5–40)

## 2018-05-12 LAB — VITAMIN D 25 HYDROXY (VIT D DEFICIENCY, FRACTURES): VIT D 25 HYDROXY: 45.5 ng/mL (ref 30.0–100.0)

## 2018-05-12 LAB — HEMOGLOBIN A1C
ESTIMATED AVERAGE GLUCOSE: 111 mg/dL
Hgb A1c MFr Bld: 5.5 % (ref 4.8–5.6)

## 2018-05-12 NOTE — Telephone Encounter (Signed)
-----   Message from Mar Daring, [place] sent at 05/12/2018 10:04 AM EDT ----- All labs are within normal limits and stable.  Thanks! -JB

## 2018-05-12 NOTE — Telephone Encounter (Signed)
She did ask me and we did discuss this in detail. She has been sitting with her feet down more and not moving as much. She needs to elevate her legs as much as she can. She even stated yesterday she had been sick on Sunday and had been in bed all day and her legs were better on Monday.

## 2018-05-12 NOTE — Telephone Encounter (Signed)
Patient advised as below.  Patient reports she forgot to ask you yesterday why her feet are swelling. Patient reports this is something new for her.

## 2018-05-12 NOTE — Telephone Encounter (Signed)
Patient advised as below.  

## 2018-05-13 DIAGNOSIS — M1991 Primary osteoarthritis, unspecified site: Secondary | ICD-10-CM | POA: Diagnosis not present

## 2018-05-13 DIAGNOSIS — M5416 Radiculopathy, lumbar region: Secondary | ICD-10-CM | POA: Diagnosis not present

## 2018-05-13 DIAGNOSIS — F419 Anxiety disorder, unspecified: Secondary | ICD-10-CM | POA: Diagnosis not present

## 2018-05-13 DIAGNOSIS — I10 Essential (primary) hypertension: Secondary | ICD-10-CM | POA: Diagnosis not present

## 2018-05-13 DIAGNOSIS — G629 Polyneuropathy, unspecified: Secondary | ICD-10-CM | POA: Diagnosis not present

## 2018-05-13 DIAGNOSIS — M5136 Other intervertebral disc degeneration, lumbar region: Secondary | ICD-10-CM | POA: Diagnosis not present

## 2018-05-17 ENCOUNTER — Other Ambulatory Visit: Payer: Self-pay | Admitting: Physician Assistant

## 2018-05-17 ENCOUNTER — Encounter: Payer: Self-pay | Admitting: Physician Assistant

## 2018-05-17 ENCOUNTER — Ambulatory Visit (INDEPENDENT_AMBULATORY_CARE_PROVIDER_SITE_OTHER): Payer: Medicare Other | Admitting: Physician Assistant

## 2018-05-17 VITALS — BP 120/56 | HR 71 | Temp 98.6°F | Resp 16 | Wt 176.2 lb

## 2018-05-17 DIAGNOSIS — K219 Gastro-esophageal reflux disease without esophagitis: Secondary | ICD-10-CM

## 2018-05-17 DIAGNOSIS — I10 Essential (primary) hypertension: Secondary | ICD-10-CM | POA: Diagnosis not present

## 2018-05-17 DIAGNOSIS — K449 Diaphragmatic hernia without obstruction or gangrene: Secondary | ICD-10-CM

## 2018-05-17 DIAGNOSIS — M5136 Other intervertebral disc degeneration, lumbar region: Secondary | ICD-10-CM | POA: Diagnosis not present

## 2018-05-17 DIAGNOSIS — F419 Anxiety disorder, unspecified: Secondary | ICD-10-CM | POA: Diagnosis not present

## 2018-05-17 DIAGNOSIS — M5416 Radiculopathy, lumbar region: Secondary | ICD-10-CM | POA: Diagnosis not present

## 2018-05-17 DIAGNOSIS — L42 Pityriasis rosea: Secondary | ICD-10-CM

## 2018-05-17 DIAGNOSIS — G629 Polyneuropathy, unspecified: Secondary | ICD-10-CM | POA: Diagnosis not present

## 2018-05-17 DIAGNOSIS — F411 Generalized anxiety disorder: Secondary | ICD-10-CM

## 2018-05-17 DIAGNOSIS — M1991 Primary osteoarthritis, unspecified site: Secondary | ICD-10-CM | POA: Diagnosis not present

## 2018-05-17 MED ORDER — DOXEPIN HCL 10 MG PO CAPS: 10.0000 mg | ORAL_CAPSULE | Freq: Two times a day (BID) | ORAL | 0 refills | Status: DC | PRN

## 2018-05-17 MED ORDER — PREDNISONE 10 MG (21) PO TBPK: ORAL_TABLET | ORAL | 0 refills | Status: DC

## 2018-05-17 NOTE — Patient Instructions (Signed)
Pityriasis Rosea Pityriasis rosea is a rash that usually appears on the trunk of the body. It may also appear on the upper arms and upper legs. It usually begins as a single patch, and then more patches begin to develop. The rash may cause mild itching, but it normally does not cause other problems. It usually goes away without treatment. However, it may take weeks or months for the rash to go away completely. What are the causes? The cause of this condition is not known. The condition does not spread from person to person (is noncontagious). What increases the risk? This condition is more likely to develop in young adults and children. It is most common in the spring and fall. What are the signs or symptoms? The main symptom of this condition is a rash.  The rash usually begins with a single oval patch that is larger than the ones that follow. This is called a herald patch. It generally appears a week or more before the rest of the rash appears.  When more patches start to develop, they spread quickly on the trunk, back, and arms. These patches are smaller than the first one.  The patches that make up the rash are usually oval-shaped and pink or red in color. They are usually flat, but they may sometimes be raised so that they can be felt with a finger. They may also be finely crinkled and have a scaly ring around the edge.  The rash does not typically appear on areas of the skin that are exposed to the sun.  Most people who have this condition do not have other symptoms, but some have mild itching. In a few cases, a mild headache or body aches may occur before the rash appears and then go away. How is this diagnosed? Your health care provider may diagnose this condition by doing a physical exam and taking your medical history. To rule out other possible causes for the rash, the health care provider may order blood tests or take a skin sample from the rash to be looked at under a microscope. How  is this treated? Usually, treatment is not needed for this condition. The rash will probably go away on its own in 4-8 weeks. In some cases, a health care provider may recommend or prescribe medicine to reduce itching. Follow these instructions at home:  Take medicines only as directed by your health care provider.  Avoid scratching the affected areas of skin.  Do not take hot baths or use a sauna. Use only warm water when bathing or showering. Heat can increase itching. Contact a health care provider if:  Your rash does not go away in 8 weeks.  Your rash gets much worse.  You have a fever.  You have swelling or pain in the rash area.  You have fluid, blood, or pus coming from the rash area. This information is not intended to replace advice given to you by your health care provider. Make sure you discuss any questions you have with your health care provider. Document Released: 12/10/2001 Document Revised: 04/10/2016 Document Reviewed: 10/11/2014 Elsevier Interactive Patient Education  2018 [employer] Inc[employer]  

## 2018-05-17 NOTE — Progress Notes (Signed)
Patient: Helen Ramsey Female    DOB: 1933/05/26   84 y.o.   MRN: [id number] Visit Date: 05/17/2018  Today's Provider: Mar Daring, PA-C   Chief Complaint  Patient presents with  . Rash   Subjective:    Rash  This is a new problem. The current episode started today. The problem is unchanged. Location: under her breast. The rash is characterized by blistering, itchiness, redness and pain. She was exposed to nothing. Associated symptoms include coughing and shortness of breath. Pertinent negatives include no fever. Treatments tried: took Benadryl.      Allergies  Allergen Reactions  . Amoxicillin   . Clindamycin/Lincomycin   . Doxycycline   . Levaquin [Levofloxacin In D5w]   . Mirabegron Nausea And Vomiting  . Nsaids Other (See Comments)    GI upset  . Oxycodone Nausea Only    Other reaction(s): Hallucination  . Phenergan [Promethazine Hcl]   . Singulair [Montelukast]   . Vicodin [Hydrocodone-Acetaminophen]   . Zocor [Simvastatin]   . Zoloft  [Sertraline] Other (See Comments)  . Ampicillin Rash  . Bactrim [Sulfamethoxazole-Trimethoprim] Rash  . Penicillins Rash    Ampicillin, Clindamycin, Doxycycline Levaquin-severe headache, muscle and joint aches, weak  . Tolmetin     Other reaction(s): Other (See Comments) GI upset     Current Outpatient Medications:  .  acetaminophen (TYLENOL) 500 MG tablet, Take 500 mg by mouth 3 (three) times daily. , Disp: , Rfl:  .  alendronate (FOSAMAX) 70 MG tablet, TAKE ONE TABLET BY MOUTH EVERY WEEK AS DIRECTED, Disp: 12 tablet, Rfl: 3 .  ALPRAZolam (XANAX) 0.25 MG tablet, Take 1 tablet (0.25 mg total) by mouth 2 (two) times daily as needed for anxiety., Disp: 30 tablet, Rfl: 5 .  aspirin EC 81 MG tablet, Take 81 mg by mouth daily., Disp: , Rfl:  .  atorvastatin (LIPITOR) 20 MG tablet, TAKE ONE (1) TABLET EACH DAY AT 6 IN THE EVENING, Disp: 90 tablet, Rfl: 2 .  azelastine (ASTELIN) 0.1 % nasal spray, PUT ONE SPRAY INTO  BOTH NOSTRILS AS NEEDED FOR RHINITIS., Disp: 30 mL, Rfl: 5 .  azithromycin (ZITHROMAX) 500 MG tablet, Take 500 mg once by mouth., Disp: , Rfl:  .  cetirizine (ZYRTEC) 10 MG tablet, Take 10 mg daily by mouth., Disp: , Rfl:  .  citalopram (CELEXA) 40 MG tablet, TAKE ONE (1) TABLET BY MOUTH EVERY DAY, Disp: 90 tablet, Rfl: 1 .  cyanocobalamin 1000 MCG tablet, Take 1,000 mcg by mouth daily., Disp: , Rfl:  .  Dentifrices (FLUORIDE TOOTHPASTE DT), Place onto teeth., Disp: , Rfl:  .  docusate sodium (COLACE) 100 MG capsule, Take 200 mg by mouth 2 (two) times daily as needed for mild constipation. , Disp: , Rfl:  .  gabapentin (NEURONTIN) 100 MG capsule, Start Gabapentin 100 mg (one tablet) in the morning, 100 mg in the afternoon (one tablet), and 200 mg (two tablets) in the evening., Disp: , Rfl:  .  hydrochlorothiazide (MICROZIDE) 12.5 MG capsule, Take 1 capsule (12.5 mg total) by mouth daily. (Patient taking differently: Take 25 mg by mouth daily. ), Disp: 90 capsule, Rfl: 1 .  lisinopril (PRINIVIL,ZESTRIL) 5 MG tablet, TAKE ONE (1) TABLET EACH DAY, Disp: 90 tablet, Rfl: 2 .  Magnesium Oxide, Antacid, 500 MG CAPS, Take 1 tablet by mouth as needed., Disp: , Rfl:  .  Melatonin 1 MG TABS, Take 1 tablet by mouth as needed. , Disp: , Rfl:  .  Omega-3 Fatty Acids (FISH OIL) 1200 MG CAPS, Take 2 capsules by mouth daily. , Disp: , Rfl:  .  omeprazole (PRILOSEC) 20 MG capsule, Take 1 capsule (20 mg total) by mouth daily., Disp: 90 capsule, Rfl: 1 .  phenazopyridine (AZO-TABS) 95 MG tablet, Take 1 tablet (95 mg total) by mouth as needed., Disp: 30 tablet, Rfl: 3 .  polyethylene glycol powder (GLYCOLAX/MIRALAX) powder, Take 17 g by mouth 2 (two) times daily as needed., Disp: 527 g, Rfl: 1 .  POTASSIUM GLUCONATE PO, Take 2 tablets by mouth daily with supper., Disp: , Rfl:  .  ranitidine (ZANTAC) 150 MG tablet, TAKE ONE TABLET TWICE DAILY, Disp: 180 tablet, Rfl: 1 .  Simethicone (GAS RELIEF PO), Take by mouth as  needed. , Disp: , Rfl:  .  sodium chloride (OCEAN) 0.65 % nasal spray, Reported on 01/30/2016, Disp: , Rfl:  .  traMADol (ULTRAM) 50 MG tablet, TAKE ONE TABLET BY MOUTH TWO TIMES A DAY AS NEEDED (Patient taking differently: TAKE ONE TABLET BY MOUTH TWO TIMES A DAY), Disp: 180 tablet, Rfl: 1 .  Calcium Carbonate-Vitamin D 600-400 MG-UNIT tablet, Take 1 tablet by mouth 2 (two) times daily. (Patient not taking: Reported on 05/17/2018), Disp: 180 tablet, Rfl: 1  Review of Systems  Constitutional: Negative for fever.  HENT: Negative.   Respiratory: Positive for cough and shortness of breath.   Cardiovascular: Positive for leg swelling (chronic). Negative for chest pain.  Skin: Positive for rash.    Social History   Tobacco Use  . Smoking status: Never Smoker  . Smokeless tobacco: Never Used  Substance Use Topics  . Alcohol use: No   Objective:   BP (!) 120/56 (BP Location: Left Arm, Patient Position: Sitting, Cuff Size: Normal)   Pulse 71   Temp 98.6 F (37 C) (Oral)   Resp 16   Wt 176 lb 3.2 oz (79.9 kg)   SpO2 97%   BMI 35.59 kg/m  Vitals:   05/17/18 1423  BP: (!) 120/56  Pulse: 71  Resp: 16  Temp: 98.6 F (37 C)  TempSrc: Oral  SpO2: 97%  Weight: 176 lb 3.2 oz (79.9 kg)     Physical Exam  Constitutional: She appears well-developed and well-nourished. No distress.  HENT:  Head: Normocephalic and atraumatic.  Neck: Normal range of motion. Neck supple.  Pulmonary/Chest: No respiratory distress.  Skin: Rash noted. Rash is macular (hyperkeratotic lesions with central scaling with severe itching). She is not diaphoretic.  Vitals reviewed.  Right breast    Left Breast       Assessment & Plan:     1. Pityriasis rosea Will treat symptomatically with prednisone and doxepin for itching as below. May take benadryl at bedtime for itching. Discussed natural course of disease. Advised to call if symptoms worsen.  - predniSONE (STERAPRED UNI-PAK 21 TAB) 10 MG (21) TBPK  tablet; 6 day taper; take as directed on package instructions  Dispense: 21 tablet; Refill: 0 - doxepin (SINEQUAN) 10 MG capsule; Take 1 capsule (10 mg total) by mouth 2 (two) times daily as needed. For itching  Dispense: 60 capsule; Refill: 0       Mar Daring, PA-C  [facility]

## 2018-05-18 ENCOUNTER — Telehealth: Payer: Self-pay | Admitting: Physician Assistant

## 2018-05-18 NOTE — Telephone Encounter (Signed)
Pt was in yesterday and prescribed Doxepin 10 mg and the pharmacy [facility] needs a PA on this drug  This is for itching.  Thanks [employer]

## 2018-05-18 NOTE — Telephone Encounter (Signed)
Please review

## 2018-05-19 DIAGNOSIS — I10 Essential (primary) hypertension: Secondary | ICD-10-CM | POA: Diagnosis not present

## 2018-05-19 DIAGNOSIS — M5136 Other intervertebral disc degeneration, lumbar region: Secondary | ICD-10-CM | POA: Diagnosis not present

## 2018-05-19 DIAGNOSIS — M1991 Primary osteoarthritis, unspecified site: Secondary | ICD-10-CM | POA: Diagnosis not present

## 2018-05-19 DIAGNOSIS — M5416 Radiculopathy, lumbar region: Secondary | ICD-10-CM | POA: Diagnosis not present

## 2018-05-19 DIAGNOSIS — F419 Anxiety disorder, unspecified: Secondary | ICD-10-CM | POA: Diagnosis not present

## 2018-05-19 DIAGNOSIS — G629 Polyneuropathy, unspecified: Secondary | ICD-10-CM | POA: Diagnosis not present

## 2018-05-19 NOTE — Telephone Encounter (Signed)
Have we received?

## 2018-05-24 DIAGNOSIS — M5416 Radiculopathy, lumbar region: Secondary | ICD-10-CM | POA: Diagnosis not present

## 2018-05-24 DIAGNOSIS — M1991 Primary osteoarthritis, unspecified site: Secondary | ICD-10-CM | POA: Diagnosis not present

## 2018-05-24 DIAGNOSIS — F419 Anxiety disorder, unspecified: Secondary | ICD-10-CM | POA: Diagnosis not present

## 2018-05-24 DIAGNOSIS — G629 Polyneuropathy, unspecified: Secondary | ICD-10-CM | POA: Diagnosis not present

## 2018-05-24 DIAGNOSIS — M5136 Other intervertebral disc degeneration, lumbar region: Secondary | ICD-10-CM | POA: Diagnosis not present

## 2018-05-24 DIAGNOSIS — I10 Essential (primary) hypertension: Secondary | ICD-10-CM | POA: Diagnosis not present

## 2018-05-24 NOTE — Telephone Encounter (Signed)
Not yet

## 2018-05-25 DIAGNOSIS — M5481 Occipital neuralgia: Secondary | ICD-10-CM | POA: Diagnosis not present

## 2018-05-25 NOTE — Telephone Encounter (Signed)
PA was denied. Patient reports rash is clearing up. Patient was given 1 week worth of Doxepin.

## 2018-05-26 DIAGNOSIS — M5136 Other intervertebral disc degeneration, lumbar region: Secondary | ICD-10-CM | POA: Diagnosis not present

## 2018-05-26 DIAGNOSIS — M5416 Radiculopathy, lumbar region: Secondary | ICD-10-CM | POA: Diagnosis not present

## 2018-05-26 DIAGNOSIS — I10 Essential (primary) hypertension: Secondary | ICD-10-CM | POA: Diagnosis not present

## 2018-05-26 DIAGNOSIS — M1991 Primary osteoarthritis, unspecified site: Secondary | ICD-10-CM | POA: Diagnosis not present

## 2018-05-26 DIAGNOSIS — G629 Polyneuropathy, unspecified: Secondary | ICD-10-CM | POA: Diagnosis not present

## 2018-05-26 DIAGNOSIS — F419 Anxiety disorder, unspecified: Secondary | ICD-10-CM | POA: Diagnosis not present

## 2018-05-31 DIAGNOSIS — M5136 Other intervertebral disc degeneration, lumbar region: Secondary | ICD-10-CM | POA: Diagnosis not present

## 2018-05-31 DIAGNOSIS — F419 Anxiety disorder, unspecified: Secondary | ICD-10-CM | POA: Diagnosis not present

## 2018-05-31 DIAGNOSIS — I10 Essential (primary) hypertension: Secondary | ICD-10-CM | POA: Diagnosis not present

## 2018-05-31 DIAGNOSIS — G629 Polyneuropathy, unspecified: Secondary | ICD-10-CM | POA: Diagnosis not present

## 2018-05-31 DIAGNOSIS — M5416 Radiculopathy, lumbar region: Secondary | ICD-10-CM | POA: Diagnosis not present

## 2018-05-31 DIAGNOSIS — M1991 Primary osteoarthritis, unspecified site: Secondary | ICD-10-CM | POA: Diagnosis not present

## 2018-06-02 DIAGNOSIS — M1991 Primary osteoarthritis, unspecified site: Secondary | ICD-10-CM | POA: Diagnosis not present

## 2018-06-02 DIAGNOSIS — I10 Essential (primary) hypertension: Secondary | ICD-10-CM | POA: Diagnosis not present

## 2018-06-02 DIAGNOSIS — M5416 Radiculopathy, lumbar region: Secondary | ICD-10-CM | POA: Diagnosis not present

## 2018-06-02 DIAGNOSIS — M5136 Other intervertebral disc degeneration, lumbar region: Secondary | ICD-10-CM | POA: Diagnosis not present

## 2018-06-02 DIAGNOSIS — G629 Polyneuropathy, unspecified: Secondary | ICD-10-CM | POA: Diagnosis not present

## 2018-06-02 DIAGNOSIS — F419 Anxiety disorder, unspecified: Secondary | ICD-10-CM | POA: Diagnosis not present

## 2018-06-08 DIAGNOSIS — I10 Essential (primary) hypertension: Secondary | ICD-10-CM | POA: Diagnosis not present

## 2018-06-08 DIAGNOSIS — M5136 Other intervertebral disc degeneration, lumbar region: Secondary | ICD-10-CM | POA: Diagnosis not present

## 2018-06-08 DIAGNOSIS — M5416 Radiculopathy, lumbar region: Secondary | ICD-10-CM | POA: Diagnosis not present

## 2018-06-08 DIAGNOSIS — F419 Anxiety disorder, unspecified: Secondary | ICD-10-CM | POA: Diagnosis not present

## 2018-06-08 DIAGNOSIS — M1991 Primary osteoarthritis, unspecified site: Secondary | ICD-10-CM | POA: Diagnosis not present

## 2018-06-08 DIAGNOSIS — G629 Polyneuropathy, unspecified: Secondary | ICD-10-CM | POA: Diagnosis not present

## 2018-06-09 DIAGNOSIS — I10 Essential (primary) hypertension: Secondary | ICD-10-CM | POA: Diagnosis not present

## 2018-06-09 DIAGNOSIS — M5416 Radiculopathy, lumbar region: Secondary | ICD-10-CM | POA: Diagnosis not present

## 2018-06-09 DIAGNOSIS — F419 Anxiety disorder, unspecified: Secondary | ICD-10-CM | POA: Diagnosis not present

## 2018-06-09 DIAGNOSIS — M5136 Other intervertebral disc degeneration, lumbar region: Secondary | ICD-10-CM | POA: Diagnosis not present

## 2018-06-09 DIAGNOSIS — M1991 Primary osteoarthritis, unspecified site: Secondary | ICD-10-CM | POA: Diagnosis not present

## 2018-06-09 DIAGNOSIS — G629 Polyneuropathy, unspecified: Secondary | ICD-10-CM | POA: Diagnosis not present

## 2018-06-15 ENCOUNTER — Other Ambulatory Visit: Payer: Self-pay | Admitting: Physician Assistant

## 2018-06-15 DIAGNOSIS — R600 Localized edema: Secondary | ICD-10-CM

## 2018-06-15 DIAGNOSIS — I10 Essential (primary) hypertension: Secondary | ICD-10-CM

## 2018-06-15 MED ORDER — HYDROCHLOROTHIAZIDE 25 MG PO TABS: 25.0000 mg | ORAL_TABLET | Freq: Every day | ORAL | 3 refills | Status: DC

## 2018-06-15 NOTE — Progress Notes (Signed)
Refilled hctz 25mg 

## 2018-06-16 ENCOUNTER — Telehealth: Payer: Self-pay | Admitting: Physician Assistant

## 2018-06-16 NOTE — Telephone Encounter (Signed)
Noted  

## 2018-06-16 NOTE — Telephone Encounter (Signed)
I called the pt to schedule AWV-S w/ NHA McKenzie.  She asked that I call [name] to schedule it because she brings her to appointments.  I spoke with [name] who said that she will call the pt to make sure she understands what it's for, then call back to schedule. VDM (DD)

## 2018-06-16 NOTE — Telephone Encounter (Signed)
Helen Ramsey is calling back to say [name] will not be having an AWE because she sees [place] 4 times a year and doesn't need an AWE.

## 2018-06-25 DIAGNOSIS — M5416 Radiculopathy, lumbar region: Secondary | ICD-10-CM | POA: Diagnosis not present

## 2018-06-25 DIAGNOSIS — M5136 Other intervertebral disc degeneration, lumbar region: Secondary | ICD-10-CM | POA: Diagnosis not present

## 2018-08-11 ENCOUNTER — Other Ambulatory Visit: Payer: Self-pay | Admitting: Physician Assistant

## 2018-08-11 DIAGNOSIS — Z1231 Encounter for screening mammogram for malignant neoplasm of breast: Secondary | ICD-10-CM

## 2018-08-16 ENCOUNTER — Encounter: Payer: Self-pay | Admitting: Physician Assistant

## 2018-08-16 ENCOUNTER — Telehealth: Payer: Self-pay | Admitting: Physician Assistant

## 2018-08-16 ENCOUNTER — Ambulatory Visit (INDEPENDENT_AMBULATORY_CARE_PROVIDER_SITE_OTHER): Payer: Medicare Other | Admitting: Physician Assistant

## 2018-08-16 VITALS — BP 120/56 | HR 56 | Temp 98.2°F | Resp 16 | Wt 181.0 lb

## 2018-08-16 DIAGNOSIS — M5481 Occipital neuralgia: Secondary | ICD-10-CM

## 2018-08-16 DIAGNOSIS — M810 Age-related osteoporosis without current pathological fracture: Secondary | ICD-10-CM | POA: Insufficient documentation

## 2018-08-16 DIAGNOSIS — R2681 Unsteadiness on feet: Secondary | ICD-10-CM

## 2018-08-16 DIAGNOSIS — H5711 Ocular pain, right eye: Secondary | ICD-10-CM | POA: Diagnosis not present

## 2018-08-16 DIAGNOSIS — R600 Localized edema: Secondary | ICD-10-CM

## 2018-08-16 DIAGNOSIS — H538 Other visual disturbances: Secondary | ICD-10-CM | POA: Insufficient documentation

## 2018-08-16 DIAGNOSIS — M2550 Pain in unspecified joint: Secondary | ICD-10-CM | POA: Diagnosis not present

## 2018-08-16 DIAGNOSIS — Z23 Encounter for immunization: Secondary | ICD-10-CM

## 2018-08-16 MED ORDER — FUROSEMIDE 20 MG PO TABS: 20.0000 mg | ORAL_TABLET | Freq: Every day | ORAL | 1 refills | Status: DC

## 2018-08-16 NOTE — Progress Notes (Signed)
Patient: Helen Ramsey Female    DOB: [birth year]   84 y.o.   MRN: [id number] Visit Date: 08/16/2018  Today's Provider: Mar Daring, PA-C   Chief Complaint  Patient presents with  . Hypertension  . Leg Swelling   Subjective:    HPI  Hypertension, follow-up:  BP Readings from Last 3 Encounters:  08/16/18 (!) 120/56  05/17/18 (!) 120/56  05/11/18 120/66    She was last seen for hypertension 2 months ago.  BP at that visit was 120/56. Management changes since that visit include no changes. She reports excellent compliance with treatment. She is not having side effects.  She is not exercising. She is adherent to low salt diet.   Outside blood pressures are highest 137/58, lowest 113/49. She is experiencing lower extremity edema. Edema is present all day.  Patient denies chest pain.   Cardiovascular risk factors include advanced age (older than 71 for men, 83 for women) and obesity (BMI >= 30 kg/m2).  Use of agents associated with hypertension: none.     Weight trend: increasing steadily Wt Readings from Last 3 Encounters:  08/16/18 181 lb (82.1 kg)  05/17/18 176 lb 3.2 oz (79.9 kg)  05/11/18 171 lb (77.6 kg)    Current diet: in general, a "healthy" diet   ------------------------------------------------------------------------      Allergies  Allergen Reactions  . Amoxicillin   . Clindamycin/Lincomycin   . Doxycycline   . Levaquin [Levofloxacin In D5w]   . Mirabegron Nausea And Vomiting  . Nsaids Other (See Comments)    GI upset  . Oxycodone Nausea Only    Other reaction(s): Hallucination  . Phenergan [Promethazine Hcl]   . Singulair [Montelukast]   . Vicodin [Hydrocodone-Acetaminophen]   . Zocor [Simvastatin]   . Zoloft  [Sertraline] Other (See Comments)  . Ampicillin Rash  . Bactrim [Sulfamethoxazole-Trimethoprim] Rash  . Penicillins Rash    Ampicillin, Clindamycin, Doxycycline Levaquin-severe headache, muscle and joint aches, weak   . Tolmetin     Other reaction(s): Other (See Comments) GI upset     Current Outpatient Medications:  .  acetaminophen (TYLENOL) 500 MG tablet, Take 500 mg by mouth daily. , Disp: , Rfl:  .  alendronate (FOSAMAX) 70 MG tablet, TAKE ONE TABLET BY MOUTH EVERY WEEK AS DIRECTED, Disp: 12 tablet, Rfl: 3 .  ALPRAZolam (XANAX) 0.25 MG tablet, TAKE ONE TABLET BY MOUTH TWO TIMES A DAY AS NEEDED FOR ANXIETY, Disp: 60 tablet, Rfl: 5 .  aspirin EC 81 MG tablet, Take 81 mg by mouth daily., Disp: , Rfl:  .  atorvastatin (LIPITOR) 20 MG tablet, TAKE ONE (1) TABLET EACH DAY AT 6 IN THE EVENING, Disp: 90 tablet, Rfl: 2 .  azelastine (ASTELIN) 0.1 % nasal spray, PUT ONE SPRAY INTO BOTH NOSTRILS AS NEEDED FOR RHINITIS., Disp: 30 mL, Rfl: 5 .  Calcium Carbonate-Vitamin D 600-400 MG-UNIT tablet, Take 1 tablet by mouth 2 (two) times daily., Disp: 180 tablet, Rfl: 1 .  cetirizine (ZYRTEC) 10 MG tablet, Take 10 mg daily by mouth., Disp: , Rfl:  .  citalopram (CELEXA) 40 MG tablet, TAKE ONE (1) TABLET BY MOUTH EVERY DAY, Disp: 90 tablet, Rfl: 1 .  cyanocobalamin 1000 MCG tablet, Take 1,000 mcg by mouth daily., Disp: , Rfl:  .  Dentifrices (FLUORIDE TOOTHPASTE DT), Place onto teeth., Disp: , Rfl:  .  docusate sodium (COLACE) 100 MG capsule, Take 300 mg by mouth 2 (two) times daily as needed for mild  constipation. , Disp: , Rfl:  .  gabapentin (NEURONTIN) 100 MG capsule, Start Gabapentin 100 mg (one tablet) in the morning, 100 mg in the afternoon (one tablet), and 200 mg (two tablets) in the evening., Disp: , Rfl:  .  hydrochlorothiazide (HYDRODIURIL) 25 MG tablet, Take 1 tablet (25 mg total) by mouth daily., Disp: 90 tablet, Rfl: 3 .  lisinopril (PRINIVIL,ZESTRIL) 5 MG tablet, TAKE ONE (1) TABLET EACH DAY, Disp: 90 tablet, Rfl: 2 .  Magnesium Oxide, Antacid, 500 MG CAPS, Take 1 tablet by mouth as needed., Disp: , Rfl:  .  Melatonin 1 MG TABS, Take 1 tablet by mouth as needed. , Disp: , Rfl:  .  Omega-3 Fatty Acids  (FISH OIL) 1200 MG CAPS, Take 2 capsules by mouth daily. , Disp: , Rfl:  .  omeprazole (PRILOSEC) 20 MG capsule, Take 1 capsule (20 mg total) by mouth daily., Disp: 90 capsule, Rfl: 1 .  phenazopyridine (AZO-TABS) 95 MG tablet, Take 1 tablet (95 mg total) by mouth as needed., Disp: 30 tablet, Rfl: 3 .  polyethylene glycol powder (GLYCOLAX/MIRALAX) powder, Take 17 g by mouth 2 (two) times daily as needed., Disp: 527 g, Rfl: 1 .  POTASSIUM GLUCONATE PO, Take 1 tablet by mouth 2 (two) times daily. , Disp: , Rfl:  .  ranitidine (ZANTAC) 150 MG tablet, TAKE ONE TABLET TWICE DAILY (Patient taking differently: Take 150 mg by mouth at bedtime. ), Disp: 180 tablet, Rfl: 1 .  Simethicone (GAS RELIEF PO), Take by mouth as needed. , Disp: , Rfl:  .  sodium chloride (OCEAN) 0.65 % nasal spray, Reported on 01/30/2016, Disp: , Rfl:  .  traMADol (ULTRAM) 50 MG tablet, TAKE ONE TABLET BY MOUTH TWO TIMES A DAY AS NEEDED (Patient taking differently: TAKE ONE TABLET BY MOUTH TWO TIMES A DAY), Disp: 180 tablet, Rfl: 1 .  azithromycin (ZITHROMAX) 500 MG tablet, Take 500 mg once by mouth., Disp: , Rfl:   Review of Systems  Constitutional: Positive for activity change and fatigue.  Respiratory: Positive for shortness of breath.   Cardiovascular: Positive for leg swelling.  Genitourinary: Positive for difficulty urinating.  Musculoskeletal: Positive for arthralgias, back pain, gait problem and neck stiffness.  Neurological: Positive for weakness and headaches.    Social History   Tobacco Use  . Smoking status: Never Smoker  . Smokeless tobacco: Never Used  Substance Use Topics  . Alcohol use: No   Objective:   BP (!) 120/56 (BP Location: Left Arm, Patient Position: Sitting, Cuff Size: Normal)   Pulse (!) 56   Temp 98.2 F (36.8 C) (Oral)   Resp 16   Wt 181 lb (82.1 kg)   SpO2 96%   BMI 36.56 kg/m  Vitals:   08/16/18 1101  BP: (!) 120/56  Pulse: (!) 56  Resp: 16  Temp: 98.2 F (36.8 C)  TempSrc:  Oral  SpO2: 96%  Weight: 181 lb (82.1 kg)     Physical Exam  Constitutional: She appears well-developed and well-nourished. No distress.  Neck: Normal range of motion. Neck supple. No JVD present. No tracheal deviation present. No thyromegaly present.  Cardiovascular: Normal rate and regular rhythm. Exam reveals no gallop and no friction rub.  Murmur heard. Pulmonary/Chest: Effort normal and breath sounds normal. No respiratory distress. She has no wheezes. She has no rales.  Musculoskeletal: She exhibits edema (2+ pitting edema).  Lymphadenopathy:    She has no cervical adenopathy.  Skin: She is not diaphoretic.  Vitals reviewed.  Assessment & Plan:     1. Bilateral lower extremity edema Stop HCTZ. Start furosemide as below. Continue potassium. I will see her back in 4-6 weeks to check labs.  - furosemide (LASIX) 20 MG tablet; Take 1 tablet (20 mg total) by mouth daily. May take extra tab if weight up greater 3 pounds  Dispense: 180 tablet; Refill: 1  2. Ocular pain, right eye New onset over last 2-3 days. Not severe. Some blurred vision. Father had glaucoma. Has appt in November, referral placed to try to get in sooner.  - Ambulatory referral to Ophthalmology  3. Blurred vision, right eye See above medical treatment plan. - Ambulatory referral to Ophthalmology  4. Gait instability Worsening. Patient reports increased stiffness and arthralgias in shoulders and knees. Reports she is not exercising like she should. All she wants to do is make quilts and take care of plants. She reports when she had [facility] she was more active and felt better. Has been having some increased weakness and scared of falling. May benefit by repeat PT if covered.  - Ambulatory referral to Home Health  5. Age-related osteoporosis without current pathological fracture Stop Fosamax. Patient has been on for 5 years. Will check BMD next year.   6. Arthralgia, unspecified joint See above medical  treatment plan fir #4.   7. Occipital neuralgia, unspecified laterality Reports worsening. Reports she will call Dr. Manuella Ghazi for return appt.  8. Need for influenza vaccination Flu vaccine given today without complication. Patient sat upright for 15 minutes to check for adverse reaction before being released. - Flu vaccine HIGH DOSE PF (Fluzone High dose)  I spent approximately 45 minutes with the patient today. Over 50% of this time was spent with counseling and educating the patient.      Mar Daring, PA-C  [facility]

## 2018-08-16 NOTE — Telephone Encounter (Signed)
Pt's daughter in law is calling to find out what compression strength to order. Kathy's number is:  [phone]  Light compression graduated compression 50-20 mmHg   Please advise.  Thanks, [employer]

## 2018-08-17 NOTE — Telephone Encounter (Signed)
We normally recommend graduated compression 20-30mmHg

## 2018-08-17 NOTE — Telephone Encounter (Signed)
Please Review

## 2018-08-18 DIAGNOSIS — H40001 Preglaucoma, unspecified, right eye: Secondary | ICD-10-CM | POA: Diagnosis not present

## 2018-08-19 DIAGNOSIS — R2681 Unsteadiness on feet: Secondary | ICD-10-CM | POA: Diagnosis not present

## 2018-08-19 DIAGNOSIS — M25511 Pain in right shoulder: Secondary | ICD-10-CM | POA: Diagnosis not present

## 2018-08-19 DIAGNOSIS — I10 Essential (primary) hypertension: Secondary | ICD-10-CM | POA: Diagnosis not present

## 2018-08-19 DIAGNOSIS — R2243 Localized swelling, mass and lump, lower limb, bilateral: Secondary | ICD-10-CM | POA: Diagnosis not present

## 2018-08-19 DIAGNOSIS — M6281 Muscle weakness (generalized): Secondary | ICD-10-CM | POA: Diagnosis not present

## 2018-08-19 DIAGNOSIS — M25512 Pain in left shoulder: Secondary | ICD-10-CM | POA: Diagnosis not present

## 2018-08-19 NOTE — Telephone Encounter (Signed)
Helen Ramsey was already informed.

## 2018-08-23 ENCOUNTER — Ambulatory Visit
Admission: RE | Admit: 2018-08-23 | Discharge: 2018-08-23 | Disposition: A | Discharge status: Home / Self Care | Payer: Medicare Other | Source: Ambulatory Visit | Attending: Physician Assistant | Admitting: Physician Assistant

## 2018-08-23 DIAGNOSIS — Z1231 Encounter for screening mammogram for malignant neoplasm of breast: Secondary | ICD-10-CM | POA: Diagnosis not present

## 2018-08-24 DIAGNOSIS — R2243 Localized swelling, mass and lump, lower limb, bilateral: Secondary | ICD-10-CM | POA: Diagnosis not present

## 2018-08-24 DIAGNOSIS — M6281 Muscle weakness (generalized): Secondary | ICD-10-CM | POA: Diagnosis not present

## 2018-08-24 DIAGNOSIS — I10 Essential (primary) hypertension: Secondary | ICD-10-CM | POA: Diagnosis not present

## 2018-08-24 DIAGNOSIS — M25511 Pain in right shoulder: Secondary | ICD-10-CM | POA: Diagnosis not present

## 2018-08-24 DIAGNOSIS — M25512 Pain in left shoulder: Secondary | ICD-10-CM | POA: Diagnosis not present

## 2018-08-24 DIAGNOSIS — R2681 Unsteadiness on feet: Secondary | ICD-10-CM | POA: Diagnosis not present

## 2018-08-26 DIAGNOSIS — M25511 Pain in right shoulder: Secondary | ICD-10-CM | POA: Diagnosis not present

## 2018-08-26 DIAGNOSIS — R2243 Localized swelling, mass and lump, lower limb, bilateral: Secondary | ICD-10-CM | POA: Diagnosis not present

## 2018-08-26 DIAGNOSIS — R2681 Unsteadiness on feet: Secondary | ICD-10-CM | POA: Diagnosis not present

## 2018-08-26 DIAGNOSIS — I10 Essential (primary) hypertension: Secondary | ICD-10-CM | POA: Diagnosis not present

## 2018-08-26 DIAGNOSIS — M6281 Muscle weakness (generalized): Secondary | ICD-10-CM | POA: Diagnosis not present

## 2018-08-26 DIAGNOSIS — M25512 Pain in left shoulder: Secondary | ICD-10-CM | POA: Diagnosis not present

## 2018-08-30 DIAGNOSIS — M25511 Pain in right shoulder: Secondary | ICD-10-CM | POA: Diagnosis not present

## 2018-08-30 DIAGNOSIS — I10 Essential (primary) hypertension: Secondary | ICD-10-CM | POA: Diagnosis not present

## 2018-08-30 DIAGNOSIS — M25512 Pain in left shoulder: Secondary | ICD-10-CM | POA: Diagnosis not present

## 2018-08-30 DIAGNOSIS — R2243 Localized swelling, mass and lump, lower limb, bilateral: Secondary | ICD-10-CM | POA: Diagnosis not present

## 2018-08-30 DIAGNOSIS — R2681 Unsteadiness on feet: Secondary | ICD-10-CM | POA: Diagnosis not present

## 2018-08-30 DIAGNOSIS — M6281 Muscle weakness (generalized): Secondary | ICD-10-CM | POA: Diagnosis not present

## 2018-09-03 DIAGNOSIS — R2243 Localized swelling, mass and lump, lower limb, bilateral: Secondary | ICD-10-CM | POA: Diagnosis not present

## 2018-09-03 DIAGNOSIS — I10 Essential (primary) hypertension: Secondary | ICD-10-CM | POA: Diagnosis not present

## 2018-09-03 DIAGNOSIS — M6281 Muscle weakness (generalized): Secondary | ICD-10-CM | POA: Diagnosis not present

## 2018-09-03 DIAGNOSIS — M25512 Pain in left shoulder: Secondary | ICD-10-CM | POA: Diagnosis not present

## 2018-09-03 DIAGNOSIS — M25511 Pain in right shoulder: Secondary | ICD-10-CM | POA: Diagnosis not present

## 2018-09-03 DIAGNOSIS — H04331 Acute lacrimal canaliculitis of right lacrimal passage: Secondary | ICD-10-CM | POA: Diagnosis not present

## 2018-09-03 DIAGNOSIS — R2681 Unsteadiness on feet: Secondary | ICD-10-CM | POA: Diagnosis not present

## 2018-09-06 DIAGNOSIS — M5481 Occipital neuralgia: Secondary | ICD-10-CM | POA: Diagnosis not present

## 2018-09-06 DIAGNOSIS — M7918 Myalgia, other site: Secondary | ICD-10-CM | POA: Diagnosis not present

## 2018-09-07 DIAGNOSIS — R2681 Unsteadiness on feet: Secondary | ICD-10-CM | POA: Diagnosis not present

## 2018-09-07 DIAGNOSIS — M25512 Pain in left shoulder: Secondary | ICD-10-CM | POA: Diagnosis not present

## 2018-09-07 DIAGNOSIS — M25511 Pain in right shoulder: Secondary | ICD-10-CM | POA: Diagnosis not present

## 2018-09-07 DIAGNOSIS — R2243 Localized swelling, mass and lump, lower limb, bilateral: Secondary | ICD-10-CM | POA: Diagnosis not present

## 2018-09-07 DIAGNOSIS — I10 Essential (primary) hypertension: Secondary | ICD-10-CM | POA: Diagnosis not present

## 2018-09-07 DIAGNOSIS — M6281 Muscle weakness (generalized): Secondary | ICD-10-CM | POA: Diagnosis not present

## 2018-09-13 DIAGNOSIS — M25511 Pain in right shoulder: Secondary | ICD-10-CM | POA: Diagnosis not present

## 2018-09-13 DIAGNOSIS — R2243 Localized swelling, mass and lump, lower limb, bilateral: Secondary | ICD-10-CM | POA: Diagnosis not present

## 2018-09-13 DIAGNOSIS — M6281 Muscle weakness (generalized): Secondary | ICD-10-CM | POA: Diagnosis not present

## 2018-09-13 DIAGNOSIS — I10 Essential (primary) hypertension: Secondary | ICD-10-CM | POA: Diagnosis not present

## 2018-09-13 DIAGNOSIS — M25512 Pain in left shoulder: Secondary | ICD-10-CM | POA: Diagnosis not present

## 2018-09-13 DIAGNOSIS — R2681 Unsteadiness on feet: Secondary | ICD-10-CM | POA: Diagnosis not present

## 2018-09-20 ENCOUNTER — Ambulatory Visit (INDEPENDENT_AMBULATORY_CARE_PROVIDER_SITE_OTHER): Payer: Medicare Other | Admitting: Physician Assistant

## 2018-09-20 ENCOUNTER — Encounter: Payer: Self-pay | Admitting: Physician Assistant

## 2018-09-20 VITALS — BP 130/60 | HR 73 | Temp 98.0°F | Resp 16 | Wt 176.0 lb

## 2018-09-20 DIAGNOSIS — R600 Localized edema: Secondary | ICD-10-CM

## 2018-09-20 DIAGNOSIS — J0140 Acute pansinusitis, unspecified: Secondary | ICD-10-CM | POA: Diagnosis not present

## 2018-09-20 MED ORDER — AZITHROMYCIN 250 MG PO TABS: ORAL_TABLET | ORAL | 0 refills | Status: DC

## 2018-09-20 NOTE — Progress Notes (Signed)
Patient: Helen Ramsey Female    DOB: 1933/07/20   84 y.o.   MRN: [id number] Visit Date: 09/20/2018  Today's Provider: Mar Daring, PA-C   Chief Complaint  Patient presents with  . Follow-up    Edema   Subjective:    HPI   Edema: Patient here for follow up edema. The location of the edema was lower leg(s) bilateral. The edema is present all day. Relieved by diuretics, support stockings, elevation of involved area, low-salt diet.Cardiac risk factors include advanced age (older than 25 for men, 51 for women) and hypertension. Patient reports that after the swelling went down that she lost up to 3 lbs.   She is complaining of sinus tenderness and pain. States it has been present x 2 weeks. Feels pressure in the frontal and maxillary sinuses. Has been complaint with her allergy medications.      Allergies  Allergen Reactions  . Amoxicillin   . Clindamycin/Lincomycin   . Doxycycline   . Levaquin [Levofloxacin In D5w]   . Mirabegron Nausea And Vomiting  . Nsaids Other (See Comments)    GI upset  . Oxycodone Nausea Only    Other reaction(s): Hallucination  . Phenergan [Promethazine Hcl]   . Singulair [Montelukast]   . Vicodin [Hydrocodone-Acetaminophen]   . Zocor [Simvastatin]   . Zoloft  [Sertraline] Other (See Comments)  . Ampicillin Rash  . Bactrim [Sulfamethoxazole-Trimethoprim] Rash  . Penicillins Rash    Ampicillin, Clindamycin, Doxycycline Levaquin-severe headache, muscle and joint aches, weak  . Tolmetin     Other reaction(s): Other (See Comments) GI upset     Current Outpatient Medications:  .  acetaminophen (TYLENOL) 500 MG tablet, Take 500 mg by mouth daily. , Disp: , Rfl:  .  ALPRAZolam (XANAX) 0.25 MG tablet, TAKE ONE TABLET BY MOUTH TWO TIMES A DAY AS NEEDED FOR ANXIETY, Disp: 60 tablet, Rfl: 5 .  aspirin EC 81 MG tablet, Take 81 mg by mouth daily., Disp: , Rfl:  .  atorvastatin (LIPITOR) 20 MG tablet, TAKE ONE (1) TABLET EACH DAY AT 6 IN  THE EVENING, Disp: 90 tablet, Rfl: 2 .  azelastine (ASTELIN) 0.1 % nasal spray, PUT ONE SPRAY INTO BOTH NOSTRILS AS NEEDED FOR RHINITIS., Disp: 30 mL, Rfl: 5 .  Calcium Carbonate-Vitamin D 600-400 MG-UNIT tablet, Take 1 tablet by mouth 2 (two) times daily., Disp: 180 tablet, Rfl: 1 .  cetirizine (ZYRTEC) 10 MG tablet, Take 10 mg daily by mouth., Disp: , Rfl:  .  citalopram (CELEXA) 40 MG tablet, TAKE ONE (1) TABLET BY MOUTH EVERY DAY, Disp: 90 tablet, Rfl: 1 .  cyanocobalamin 1000 MCG tablet, Take 1,000 mcg by mouth daily., Disp: , Rfl:  .  Dentifrices (FLUORIDE TOOTHPASTE DT), Place onto teeth., Disp: , Rfl:  .  docusate sodium (COLACE) 100 MG capsule, Take 300 mg by mouth 2 (two) times daily as needed for mild constipation. , Disp: , Rfl:  .  furosemide (LASIX) 20 MG tablet, Take 1 tablet (20 mg total) by mouth daily. May take extra tab if weight up greater 3 pounds, Disp: 180 tablet, Rfl: 1 .  gabapentin (NEURONTIN) 100 MG capsule, Start Gabapentin 100 mg (one tablet) in the morning, 100 mg in the afternoon (one tablet), and 200 mg (two tablets) in the evening., Disp: , Rfl:  .  lisinopril (PRINIVIL,ZESTRIL) 5 MG tablet, TAKE ONE (1) TABLET EACH DAY, Disp: 90 tablet, Rfl: 2 .  Magnesium Oxide, Antacid, 500 MG CAPS,  Take 1 tablet by mouth as needed., Disp: , Rfl:  .  Melatonin 1 MG TABS, Take 1 tablet by mouth as needed. , Disp: , Rfl:  .  Omega-3 Fatty Acids (FISH OIL) 1200 MG CAPS, Take 2 capsules by mouth daily. , Disp: , Rfl:  .  omeprazole (PRILOSEC) 20 MG capsule, Take 1 capsule (20 mg total) by mouth daily., Disp: 90 capsule, Rfl: 1 .  phenazopyridine (AZO-TABS) 95 MG tablet, Take 1 tablet (95 mg total) by mouth as needed., Disp: 30 tablet, Rfl: 3 .  polyethylene glycol powder (GLYCOLAX/MIRALAX) powder, Take 17 g by mouth 2 (two) times daily as needed., Disp: 527 g, Rfl: 1 .  POTASSIUM GLUCONATE PO, Take 1 tablet by mouth 2 (two) times daily. , Disp: , Rfl:  .  ranitidine (ZANTAC) 150 MG  tablet, TAKE ONE TABLET TWICE DAILY (Patient taking differently: Take 150 mg by mouth at bedtime. ), Disp: 180 tablet, Rfl: 1 .  Simethicone (GAS RELIEF PO), Take by mouth as needed. , Disp: , Rfl:  .  sodium chloride (OCEAN) 0.65 % nasal spray, Reported on 01/30/2016, Disp: , Rfl:  .  traMADol (ULTRAM) 50 MG tablet, TAKE ONE TABLET BY MOUTH TWO TIMES A DAY AS NEEDED (Patient taking differently: TAKE ONE TABLET BY MOUTH TWO TIMES A DAY), Disp: 180 tablet, Rfl: 1 .  azithromycin (ZITHROMAX) 500 MG tablet, Take 500 mg once by mouth., Disp: , Rfl:  .  EMGALITY 120 MG/ML SOAJ, , Disp: , Rfl: 0  Review of Systems  Constitutional: Negative.   Respiratory: Negative.   Cardiovascular: Negative.   Neurological: Negative.   Psychiatric/Behavioral: Negative.     Social History   Tobacco Use  . Smoking status: Never Smoker  . Smokeless tobacco: Never Used  Substance Use Topics  . Alcohol use: No   Objective:   BP 130/60 (BP Location: Left Arm, Patient Position: Sitting, Cuff Size: Normal)   Pulse 73   Temp 98 F (36.7 C) (Oral)   Resp 16   Wt 176 lb (79.8 kg)   SpO2 96%   BMI 35.55 kg/m  Vitals:   09/20/18 1150  BP: 130/60  Pulse: 73  Resp: 16  Temp: 98 F (36.7 C)  TempSrc: Oral  SpO2: 96%  Weight: 176 lb (79.8 kg)     Physical Exam  Constitutional: She appears well-developed and well-nourished. No distress.  HENT:  Head: Normocephalic and atraumatic.  Right Ear: Hearing, tympanic membrane, external ear and ear canal normal.  Left Ear: Hearing, tympanic membrane, external ear and ear canal normal.  Nose: Right sinus exhibits maxillary sinus tenderness and frontal sinus tenderness. Left sinus exhibits maxillary sinus tenderness and frontal sinus tenderness.  Mouth/Throat: Uvula is midline, oropharynx is clear and moist and mucous membranes are normal. No oropharyngeal exudate.  Neck: Normal range of motion. Neck supple. No JVD present. No tracheal deviation present. No  thyromegaly present.  Cardiovascular: Normal rate, regular rhythm and normal heart sounds. Exam reveals no gallop and no friction rub.  No murmur heard. Pulmonary/Chest: Effort normal and breath sounds normal. No stridor. No respiratory distress. She has no wheezes. She has no rales.  Lymphadenopathy:    She has no cervical adenopathy.  Skin: She is not diaphoretic.  Vitals reviewed.       Assessment & Plan:     1. Lower extremity edema Swelling improved. Continue lasix as prescribed. Continue compression stockings and elevating legs when at rest. Call if worsening again.   2.  Acute non-recurrent pansinusitis Worsening symptoms that have not responded to OTC medications. Will give Zpak as below. Continue allergy medications. Stay well hydrated and get plenty of rest. Call if no symptom improvement or if symptoms worsen. - azithromycin (ZITHROMAX) 250 MG tablet; Take 2 tablets PO on day one, and one tablet PO daily thereafter until completed.  Dispense: 6 tablet; Refill: 0       Mar Daring, PA-C  [facility]

## 2018-09-23 DIAGNOSIS — H04331 Acute lacrimal canaliculitis of right lacrimal passage: Secondary | ICD-10-CM | POA: Diagnosis not present

## 2018-09-24 ENCOUNTER — Other Ambulatory Visit: Payer: Medicare Other

## 2018-09-24 ENCOUNTER — Ambulatory Visit: Payer: Medicare Other | Admitting: Oncology

## 2018-09-28 ENCOUNTER — Encounter: Payer: Self-pay | Admitting: Oncology

## 2018-09-28 ENCOUNTER — Inpatient Hospital Stay (HOSPITAL_BASED_OUTPATIENT_CLINIC_OR_DEPARTMENT_OTHER): Payer: Medicare Other | Admitting: Oncology

## 2018-09-28 ENCOUNTER — Inpatient Hospital Stay: Payer: Medicare Other | Attending: Oncology

## 2018-09-28 VITALS — BP 117/65 | HR 61 | Temp 98.5°F | Resp 18 | Ht 59.0 in | Wt 174.1 lb

## 2018-09-28 DIAGNOSIS — D472 Monoclonal gammopathy: Secondary | ICD-10-CM | POA: Diagnosis not present

## 2018-09-28 DIAGNOSIS — Z79899 Other long term (current) drug therapy: Secondary | ICD-10-CM | POA: Insufficient documentation

## 2018-09-28 LAB — COMPREHENSIVE METABOLIC PANEL
ALBUMIN: 3.8 g/dL (ref 3.5–5.0)
ALK PHOS: 58 U/L (ref 38–126)
ALT: 21 U/L (ref 0–44)
AST: 22 U/L (ref 15–41)
Anion gap: 7 (ref 5–15)
BILIRUBIN TOTAL: 0.4 mg/dL (ref 0.3–1.2)
BUN: 18 mg/dL (ref 8–23)
CALCIUM: 9.3 mg/dL (ref 8.9–10.3)
CO2: 30 mmol/L (ref 22–32)
CREATININE: 0.73 mg/dL (ref 0.44–1.00)
Chloride: 101 mmol/L (ref 98–111)
GFR calc non Af Amer: >60 mL/min (ref >60)
GLUCOSE: 94 mg/dL (ref 70–99)
Potassium: 4.4 mmol/L (ref 3.5–5.1)
SODIUM: 138 mmol/L (ref 135–145)
Total Protein: 7 g/dL (ref 6.5–8.1)

## 2018-09-28 LAB — CBC WITH DIFFERENTIAL/PLATELET
Abs Immature Granulocytes: 0.03 10*3/uL (ref 0.00–0.07)
Basophils Absolute: 0 10*3/uL (ref 0.0–0.1)
Basophils Relative: 1 %
EOS ABS: 0.2 10*3/uL (ref 0.0–0.5)
EOS PCT: 3 %
HCT: 37.8 % (ref 36.0–46.0)
Hemoglobin: 12.6 g/dL (ref 12.0–15.0)
Immature Granulocytes: 1 %
LYMPHS ABS: 1.4 10*3/uL (ref 0.7–4.0)
Lymphocytes Relative: 25 %
MCH: 30.2 pg (ref 26.0–34.0)
MCHC: 33.3 g/dL (ref 30.0–36.0)
MCV: 90.6 fL (ref 80.0–100.0)
MONO ABS: 0.6 10*3/uL (ref 0.1–1.0)
MONOS PCT: 11 %
Neutro Abs: 3.5 10*3/uL (ref 1.7–7.7)
Neutrophils Relative %: 59 %
Platelets: 173 10*3/uL (ref 150–400)
RBC: 4.17 MIL/uL (ref 3.87–5.11)
RDW: 12.7 % (ref 11.5–15.5)
WBC: 5.8 10*3/uL (ref 4.0–10.5)
nRBC: 0 % (ref 0.0–0.2)

## 2018-09-28 NOTE — Progress Notes (Signed)
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  This was not found on repeat testing  Chief complaint/ Reason for visit-routine follow-up of possible MGUS  Heme/Onc history: Patient is a 84 year old female who was seen by Dr.HemangShah from urology for symptoms of occipital neuralgia as well as dizziness. As a part of the workup patient had blood work done which showed abnormal paraprotein in her serum and hence has been referred to [place].The patient is already been due hematology who repeated a workup for her SPEP as mentioned below. They also repeat skeletal survey which showed: 1.Mildly expansile rounded lucency within the mid C2 vertebral body on lateral view is favored to represent a normal lateral mass en face. A lytic lesion is felt less likely, however cannot be excluded. 2.Transitional anatomy at the lumbosacral junction with 6 nonrib-bearing bearing lumbar type vertebral bodies. For purposes of this exam the most inferior will be labeled as L6. There is of the L6 vertebral body which has a fused assimilation joint on the left. Grade 1 anterolisthesis of L5 on L6 with severe secondary degenerative disc disease. Recommend close radiographic correlation as to vertebral body level prior to any fluoroscopically-guided procedure.  CBC from 12/29/2016 showed white count of 4.6, H&H of 12.7/38.1 and a platelet count of 208. CMP was within normal limits including serum calcium of 9.2 and a normal total protein of 7.1. LDH was normal at 138. SPEP showed normal quantitative immunoglobulins and no M spike was observed but serum immunofixation showed free monoclonal gammopathy. Off note  patient has had monoclonal gammopathy seen on immunofixation in January 2018 IgG lambda as well  Repeat myeloma panel done in may 2018 did not show monoclonal protein in SPEP and IFE. Free light chain ratio was normal although kappa light chains were mildly elevated  Interval history-she is doing well for her age and denies any specific complaints today  ECOG PS- 1 Pain scale- 0 Opioid associated constipation- no  Review of systems- Review of Systems  Constitutional: Negative for chills, fever, malaise/fatigue and weight loss.  HENT: Negative for congestion, ear discharge and nosebleeds.   Eyes: Negative for blurred vision.  Respiratory: Negative for cough, hemoptysis, sputum production, shortness of breath and wheezing.   Cardiovascular: Negative for chest pain, palpitations, orthopnea and claudication.  Gastrointestinal: Negative for abdominal pain, blood in stool, constipation, diarrhea, heartburn, melena, nausea and vomiting.  Genitourinary: Negative for dysuria, flank pain, frequency, hematuria and urgency.  Musculoskeletal: Negative for back pain, joint pain and myalgias.  Skin: Negative for rash.  Neurological: Negative for dizziness, tingling, focal weakness, seizures, weakness and headaches.  Endo/Heme/Allergies: Does not bruise/bleed easily.  Psychiatric/Behavioral: Negative for depression and suicidal ideas. The patient does not have insomnia.       Allergies  Allergen Reactions  . Amoxicillin   . Clindamycin/Lincomycin   . Doxycycline   . Levaquin [Levofloxacin In D5w]   . Mirabegron Nausea And Vomiting  . Nsaids Other (See Comments)    GI upset  . Oxycodone Nausea Only    Other reaction(s): Hallucination  . Phenergan [Promethazine Hcl]   . Singulair [Montelukast]   . Vicodin [Hydrocodone-Acetaminophen]   . Zocor [Simvastatin]   . Zoloft  [Sertraline] Other (  See Comments)  . Ampicillin Rash  . Bactrim [Sulfamethoxazole-Trimethoprim] Rash  . Penicillins Rash     Ampicillin, Clindamycin, Doxycycline Levaquin-severe headache, muscle and joint aches, weak  . Tolmetin     Other reaction(s): Other (See Comments) GI upset     Past Medical History:  Diagnosis Date  . Allergic rhinitis, cause unspecified   . Allergic rhinitis, cause unspecified   . Atherosclerosis of aorta ([place])   . Back pain    lumbar  . Degeneration of lumbar or lumbosacral intervertebral disc   . Diverticulitis of colon (without mention of hemorrhage)(562.11)   . Dizziness and giddiness   . Dysthymic disorder   . Headache(784.0)   . Myalgia and myositis, unspecified   . Osteoporosis, unspecified   . Other and unspecified hyperlipidemia   . Other diseases of nasal cavity and sinuses(478.19)   . Other specified disorders of bladder   . Syncope   . Trigger finger (acquired)   . Unspecified essential hypertension   . Unspecified sleep apnea   . Urinary frequency      Past Surgical History:  Procedure Laterality Date  . ABDOMINAL HYSTERECTOMY    . ADENOIDECTOMY    . APPENDECTOMY    . ARTHROSCOPIC KNEE SURGERY    . CARDIAC CATHETERIZATION     [facility]  . CARPAL TUNNEL RELEASE    . CATARACT EXTRACTION W/ INTRAOCULAR LENS IMPLANT & ANTERIOR VITRECTOMY, BILATERAL    . CHOLECYSTECTOMY    . KNEE ARTHROPLASTY Right 01/30/2016   Procedure: COMPUTER ASSISTED TOTAL KNEE ARTHROPLASTY;  Surgeon: Dereck Leep, MD;  Location: [facility] ORS;  Service: Orthopedics;  Laterality: Right;  . R/O Lymph Nodes; Right Axilla    . ROTATOR CUFF REPAIR Right   . SPG block  03/20/2016   sphenopalatine ganglion and maxillary division of trigeminal nerve block block Dr. Manuella Ghazi  . TEMPORAL ARTERY BIOPSY / LIGATION    . TONSILLECTOMY    . TUBAL LIGATION      Social History   Socioeconomic History  . Marital status: Widowed    Spouse name: Not on file  . Number of children: Not on file  . Years of education: Not on file  . Highest education level: Not on file  Occupational History  . Not on file    Social Needs  . Financial resource strain: Not on file  . Food insecurity:    Worry: Not on file    Inability: Not on file  . Transportation needs:    Medical: Not on file    Non-medical: Not on file  Tobacco Use  . Smoking status: Never Smoker  . Smokeless tobacco: Never Used  Substance and Sexual Activity  . Alcohol use: No  . Drug use: No  . Sexual activity: Not on file  Lifestyle  . Physical activity:    Days per week: Not on file    Minutes per session: Not on file  . Stress: Not on file  Relationships  . Social connections:    Talks on phone: Not on file    Gets together: Not on file    Attends religious service: Not on file    Active member of club or organization: Not on file    Attends meetings of clubs or organizations: Not on file    Relationship status: Not on file  . Intimate partner violence:    Fear of current or ex partner: Not on file    Emotionally abused: Not on file    Physically abused:  Not on file    Forced sexual activity: Not on file  Other Topics Concern  . Not on file  Social History Narrative  . Not on file    Family History  Problem Relation Age of Onset  . Heart disease Mother   . Depression Mother   . Colon polyps Mother   . Dementia Mother   . Heart disease Father   . Dementia Father   . Heart disease Brother   . Hypertension Brother   . Heart disease Brother   . Hypertension Brother   . Kidney cancer Neg Hx   . Bladder Cancer Neg Hx      Current Outpatient Medications:  .  acetaminophen (TYLENOL) 500 MG tablet, Take 500 mg by mouth daily. , Disp: , Rfl:  .  aspirin EC 81 MG tablet, Take 81 mg by mouth daily., Disp: , Rfl:  .  atorvastatin (LIPITOR) 20 MG tablet, TAKE ONE (1) TABLET EACH DAY AT 6 IN THE EVENING, Disp: 90 tablet, Rfl: 2 .  Calcium Carbonate-Vitamin D 600-400 MG-UNIT tablet, Take 1 tablet by mouth 2 (two) times daily., Disp: 180 tablet, Rfl: 1 .  cetirizine (ZYRTEC) 10 MG tablet, Take 10 mg daily by mouth.,  Disp: , Rfl:  .  citalopram (CELEXA) 40 MG tablet, TAKE ONE (1) TABLET BY MOUTH EVERY DAY, Disp: 90 tablet, Rfl: 1 .  cyanocobalamin 1000 MCG tablet, Take 1,000 mcg by mouth daily., Disp: , Rfl:  .  Dentifrices (FLUORIDE TOOTHPASTE DT), Place onto teeth., Disp: , Rfl:  .  EMGALITY 120 MG/ML SOAJ, , Disp: , Rfl: 0 .  furosemide (LASIX) 20 MG tablet, Take 1 tablet (20 mg total) by mouth daily. May take extra tab if weight up greater 3 pounds, Disp: 180 tablet, Rfl: 1 .  gabapentin (NEURONTIN) 100 MG capsule, Start Gabapentin 100 mg (one tablet) in the morning, 100 mg in the afternoon (one tablet), and 200 mg (two tablets) in the evening., Disp: , Rfl:  .  lisinopril (PRINIVIL,ZESTRIL) 5 MG tablet, TAKE ONE (1) TABLET EACH DAY, Disp: 90 tablet, Rfl: 2 .  Magnesium Oxide, Antacid, 500 MG CAPS, Take 1 tablet by mouth as needed., Disp: , Rfl:  .  Omega-3 Fatty Acids (FISH OIL) 1200 MG CAPS, Take 2 capsules by mouth daily. , Disp: , Rfl:  .  omeprazole (PRILOSEC) 20 MG capsule, Take 1 capsule (20 mg total) by mouth daily., Disp: 90 capsule, Rfl: 1 .  POTASSIUM GLUCONATE PO, Take 1 tablet by mouth 2 (two) times daily. , Disp: , Rfl:  .  ranitidine (ZANTAC) 150 MG tablet, TAKE ONE TABLET TWICE DAILY (Patient taking differently: Take 150 mg by mouth at bedtime. ), Disp: 180 tablet, Rfl: 1 .  ALPRAZolam (XANAX) 0.25 MG tablet, TAKE ONE TABLET BY MOUTH TWO TIMES A DAY AS NEEDED FOR ANXIETY (Patient not taking: Reported on 09/28/2018), Disp: 60 tablet, Rfl: 5 .  azelastine (ASTELIN) 0.1 % nasal spray, PUT ONE SPRAY INTO BOTH NOSTRILS AS NEEDED FOR RHINITIS. (Patient not taking: Reported on 09/28/2018), Disp: 30 mL, Rfl: 5 .  azithromycin (ZITHROMAX) 250 MG tablet, Take 2 tablets PO on day one, and one tablet PO daily thereafter until completed., Disp: 6 tablet, Rfl: 0 .  azithromycin (ZITHROMAX) 500 MG tablet, Take 500 mg once by mouth., Disp: , Rfl:  .  docusate sodium (COLACE) 100 MG capsule, Take 300 mg by  mouth 2 (two) times daily as needed for mild constipation. , Disp: , Rfl:  .  Melatonin 1 MG TABS, Take 1 tablet by mouth as needed. , Disp: , Rfl:  .  phenazopyridine (AZO-TABS) 95 MG tablet, Take 1 tablet (95 mg total) by mouth as needed. (Patient not taking: Reported on 09/28/2018), Disp: 30 tablet, Rfl: 3 .  polyethylene glycol powder (GLYCOLAX/MIRALAX) powder, Take 17 g by mouth 2 (two) times daily as needed. (Patient not taking: Reported on 09/28/2018), Disp: 527 g, Rfl: 1 .  Simethicone (GAS RELIEF PO), Take by mouth as needed. , Disp: , Rfl:  .  sodium chloride (OCEAN) 0.65 % nasal spray, Reported on 01/30/2016, Disp: , Rfl:  .  traMADol (ULTRAM) 50 MG tablet, TAKE ONE TABLET BY MOUTH TWO TIMES A DAY AS NEEDED (Patient not taking: Reported on 09/28/2018), Disp: 180 tablet, Rfl: 1  Physical exam:  Vitals:   09/28/18 1005  BP: 117/65  Pulse: 61  Resp: 18  Temp: 98.5 F (36.9 C)  TempSrc: Tympanic  SpO2: 99%  Weight: 174 lb 1.6 oz (79 kg)  Height: 4\' 11"  (1.499 m)   Physical Exam  Constitutional: She is oriented to person, place, and time.  Patient is obese.  She ambulates with a walker  HENT:  Head: Normocephalic and atraumatic.  Eyes: Pupils are equal, round, and reactive to light. EOM are normal.  Neck: Normal range of motion.  Cardiovascular: Normal rate, regular rhythm and normal heart sounds.  Pulmonary/Chest: Effort normal and breath sounds normal.  Abdominal: Soft. Bowel sounds are normal.  Neurological: She is alert and oriented to person, place, and time.  Skin: Skin is warm and dry.     CMP Latest Ref Rng & Units 09/28/2018  Glucose 70 - 99 mg/dL 94  BUN 8 - 23 mg/dL 18  Creatinine 0.44 - 1.00 mg/dL 0.73  Sodium 135 - 145 mmol/L 138  Potassium 3.5 - 5.1 mmol/L 4.4  Chloride 98 - 111 mmol/L 101  CO2 22 - 32 mmol/L 30  Calcium 8.9 - 10.3 mg/dL 9.3  Total Protein 6.5 - 8.1 g/dL 7.0  Total Bilirubin 0.3 - 1.2 mg/dL 0.4  Alkaline Phos 38 - 126 U/L 58  AST 15 -  41 U/L 22  ALT 0 - 44 U/L 21   CBC Latest Ref Rng & Units 09/28/2018  WBC 4.0 - 10.5 K/uL 5.8  Hemoglobin 12.0 - 15.0 g/dL 12.6  Hematocrit 36.0 - 46.0 % 37.8  Platelets 150 - 400 K/uL 173     Assessment and plan- Patient is a 84 y.o. female with possible MGUS  Patient was found to have a small amount of monoclonal protein on immunofixation but not an SPEP when checked at neurology office.  I subsequently repeated her myeloma panel which did not reveal any M protein in both SPEP and immunofixation in November 2018.  Serum free light chain was also normal.  This is her one-year follow-up and her myeloma panel and serum free light chains are pending today.  CBC and CMP is normal.  If myeloma panel does not reveal any M protein she does not require any oncology follow-up at this time     Visit Diagnosis 1. MGUS (monoclonal gammopathy of unknown significance)      Dr. Randa Evens, MD, MPH [facility] [phone] 09/28/2018 12:35 PM

## 2018-09-29 LAB — KAPPA/LAMBDA LIGHT CHAINS
Kappa free light chain: 25.4 mg/L — ABNORMAL HIGH (ref 3.3–19.4)
Kappa, lambda light chain ratio: 1.29 (ref 0.26–1.65)
Lambda free light chains: 19.7 mg/L (ref 5.7–26.3)

## 2018-10-01 LAB — MULTIPLE MYELOMA PANEL, SERUM
ALBUMIN/GLOB SERPL: 1.4 (ref 0.7–1.7)
Albumin SerPl Elph-Mcnc: 3.6 g/dL (ref 2.9–4.4)
Alpha 1: 0.2 g/dL (ref 0.0–0.4)
Alpha2 Glob SerPl Elph-Mcnc: 0.8 g/dL (ref 0.4–1.0)
B-GLOBULIN SERPL ELPH-MCNC: 0.9 g/dL (ref 0.7–1.3)
GAMMA GLOB SERPL ELPH-MCNC: 0.7 g/dL (ref 0.4–1.8)
Globulin, Total: 2.6 g/dL (ref 2.2–3.9)
IGA: 217 mg/dL (ref 64–422)
IgG (Immunoglobin G), Serum: 834 mg/dL (ref 700–1600)
IgM (Immunoglobulin M), Srm: 92 mg/dL (ref 26–217)
Total Protein ELP: 6.2 g/dL (ref 6.0–8.5)

## 2018-10-04 ENCOUNTER — Telehealth: Payer: Self-pay | Admitting: *Deleted

## 2018-10-04 NOTE — Telephone Encounter (Signed)
Called daughter and got her voicemail and left message that her blood work the [place] or M protein was not detected. The kappa lambda chains were elevated but the same as it has been. Since the M protein was not detected; Dr. Janese Banks says that pt no longer needs to come to clinic and that is what she said the last visit and we did not make future appts because Janese Banks felt that the labs would be good. I did leave her my  Number if she would like to call if she has questions.

## 2018-10-04 NOTE — Telephone Encounter (Signed)
-----   Message from Sindy Guadeloupe, MD sent at 10/04/2018  7:52 AM EST ----- Please let patients daughter know her myeloma panel showed no M protein. Free light chain is normal. No need for oncology follow up as discussed. Thanks, Astrid Divine

## 2018-10-05 ENCOUNTER — Other Ambulatory Visit: Payer: Self-pay

## 2018-10-20 DIAGNOSIS — D472 Monoclonal gammopathy: Secondary | ICD-10-CM | POA: Diagnosis not present

## 2018-10-20 DIAGNOSIS — G250 Essential tremor: Secondary | ICD-10-CM | POA: Diagnosis not present

## 2018-10-20 DIAGNOSIS — G4733 Obstructive sleep apnea (adult) (pediatric): Secondary | ICD-10-CM | POA: Diagnosis not present

## 2018-10-20 DIAGNOSIS — H8110 Benign paroxysmal vertigo, unspecified ear: Secondary | ICD-10-CM | POA: Diagnosis not present

## 2018-10-20 DIAGNOSIS — R251 Tremor, unspecified: Secondary | ICD-10-CM | POA: Diagnosis not present

## 2018-10-20 DIAGNOSIS — M5481 Occipital neuralgia: Secondary | ICD-10-CM | POA: Diagnosis not present

## 2018-11-01 ENCOUNTER — Other Ambulatory Visit: Payer: Self-pay | Admitting: Physician Assistant

## 2018-11-01 DIAGNOSIS — K219 Gastro-esophageal reflux disease without esophagitis: Secondary | ICD-10-CM

## 2018-11-01 DIAGNOSIS — F411 Generalized anxiety disorder: Secondary | ICD-10-CM

## 2018-11-01 DIAGNOSIS — M1990 Unspecified osteoarthritis, unspecified site: Secondary | ICD-10-CM

## 2018-11-25 DIAGNOSIS — H04331 Acute lacrimal canaliculitis of right lacrimal passage: Secondary | ICD-10-CM | POA: Diagnosis not present

## 2018-12-16 ENCOUNTER — Other Ambulatory Visit: Payer: Self-pay | Admitting: Physician Assistant

## 2018-12-16 DIAGNOSIS — F411 Generalized anxiety disorder: Secondary | ICD-10-CM

## 2018-12-16 DIAGNOSIS — E785 Hyperlipidemia, unspecified: Secondary | ICD-10-CM

## 2018-12-16 DIAGNOSIS — I10 Essential (primary) hypertension: Secondary | ICD-10-CM

## 2018-12-16 DIAGNOSIS — G629 Polyneuropathy, unspecified: Secondary | ICD-10-CM

## 2018-12-16 DIAGNOSIS — K219 Gastro-esophageal reflux disease without esophagitis: Secondary | ICD-10-CM

## 2018-12-16 DIAGNOSIS — R600 Localized edema: Secondary | ICD-10-CM

## 2018-12-16 NOTE — Telephone Encounter (Signed)
[facility] faxed refill request for the following medications:  1. omeprazole (PRILOSEC) 20 MG capsule 2. atorvastatin (LIPITOR) 20 MG tablet 3. lisinopril (PRINIVIL,ZESTRIL) 5 MG tablet 4. gabapentin (NEURONTIN) 100 MG capsule 5. citalopram (CELEXA) 40 MG tablet 6. furosemide (LASIX) 20 MG tablet   Please advise. Thanks TNP

## 2018-12-17 MED ORDER — LISINOPRIL 5 MG PO TABS: ORAL_TABLET | ORAL | 1 refills | Status: DC

## 2018-12-17 MED ORDER — CITALOPRAM HYDROBROMIDE 40 MG PO TABS: ORAL_TABLET | ORAL | 1 refills | Status: DC

## 2018-12-17 MED ORDER — OMEPRAZOLE 20 MG PO CPDR: 20.0000 mg | DELAYED_RELEASE_CAPSULE | Freq: Every day | ORAL | 1 refills | Status: DC

## 2018-12-17 MED ORDER — FUROSEMIDE 20 MG PO TABS: 20.0000 mg | ORAL_TABLET | Freq: Every day | ORAL | 1 refills | Status: DC

## 2018-12-17 MED ORDER — GABAPENTIN 100 MG PO CAPS: ORAL_CAPSULE | ORAL | 1 refills | Status: DC

## 2018-12-17 MED ORDER — ATORVASTATIN CALCIUM 20 MG PO TABS: ORAL_TABLET | ORAL | 1 refills | Status: DC

## 2018-12-22 DIAGNOSIS — M5416 Radiculopathy, lumbar region: Secondary | ICD-10-CM | POA: Diagnosis not present

## 2018-12-22 DIAGNOSIS — M5136 Other intervertebral disc degeneration, lumbar region: Secondary | ICD-10-CM | POA: Diagnosis not present

## 2018-12-22 DIAGNOSIS — M7062 Trochanteric bursitis, left hip: Secondary | ICD-10-CM | POA: Diagnosis not present

## 2018-12-22 DIAGNOSIS — M48062 Spinal stenosis, lumbar region with neurogenic claudication: Secondary | ICD-10-CM | POA: Diagnosis not present

## 2018-12-27 ENCOUNTER — Encounter: Payer: Self-pay | Admitting: Physician Assistant

## 2018-12-27 ENCOUNTER — Ambulatory Visit (INDEPENDENT_AMBULATORY_CARE_PROVIDER_SITE_OTHER): Payer: Medicare Other | Admitting: Physician Assistant

## 2018-12-27 VITALS — BP 136/71 | HR 63 | Temp 98.1°F | Wt 179.2 lb

## 2018-12-27 DIAGNOSIS — I10 Essential (primary) hypertension: Secondary | ICD-10-CM | POA: Diagnosis not present

## 2018-12-27 DIAGNOSIS — I700 Atherosclerosis of aorta: Secondary | ICD-10-CM | POA: Diagnosis not present

## 2018-12-27 DIAGNOSIS — R600 Localized edema: Secondary | ICD-10-CM

## 2018-12-27 DIAGNOSIS — R7309 Other abnormal glucose: Secondary | ICD-10-CM

## 2018-12-27 DIAGNOSIS — J324 Chronic pansinusitis: Secondary | ICD-10-CM

## 2018-12-27 DIAGNOSIS — E7849 Other hyperlipidemia: Secondary | ICD-10-CM | POA: Diagnosis not present

## 2018-12-27 DIAGNOSIS — K219 Gastro-esophageal reflux disease without esophagitis: Secondary | ICD-10-CM | POA: Diagnosis not present

## 2018-12-27 DIAGNOSIS — E559 Vitamin D deficiency, unspecified: Secondary | ICD-10-CM

## 2018-12-27 DIAGNOSIS — Z6836 Body mass index (BMI) 36.0-36.9, adult: Secondary | ICD-10-CM

## 2018-12-27 DIAGNOSIS — F3341 Major depressive disorder, recurrent, in partial remission: Secondary | ICD-10-CM | POA: Diagnosis not present

## 2018-12-27 MED ORDER — FAMOTIDINE 20 MG PO TABS: 20.0000 mg | ORAL_TABLET | Freq: Every day | ORAL | 1 refills | Status: DC | PRN

## 2018-12-27 MED ORDER — AZELASTINE HCL 0.1 % NA SOLN: 1.0000 | Freq: Two times a day (BID) | NASAL | 5 refills | Status: DC

## 2018-12-27 MED ORDER — MOMETASONE FUROATE 50 MCG/ACT NA SUSP: 2.0000 | Freq: Every day | NASAL | 12 refills | Status: DC

## 2018-12-27 MED ORDER — POTASSIUM GLUCONATE 595 (99 K) MG PO TABS: 595.0000 mg | ORAL_TABLET | Freq: Two times a day (BID) | ORAL | 1 refills | Status: AC

## 2018-12-27 NOTE — Patient Instructions (Signed)
Health Maintenance After Age 83 After age 83, you are at a higher risk for certain long-term diseases and infections as well as injuries from falls. Falls are a major cause of broken bones and head injuries in people who are older than age 83. Getting regular preventive care can help to keep you healthy and well. Preventive care includes getting regular testing and making lifestyle changes as recommended by your health care provider. Talk with your health care provider about:  Which screenings and tests you should have. A screening is a test that checks for a disease when you have no symptoms.  A diet and exercise plan that is right for you. What should I know about screenings and tests to prevent falls? Screening and testing are the best ways to find a health problem early. Early diagnosis and treatment give you the best chance of managing medical conditions that are common after age 83. Certain conditions and lifestyle choices may make you more likely to have a fall. Your health care provider may recommend:  Regular vision checks. Poor vision and conditions such as cataracts can make you more likely to have a fall. If you wear glasses, make sure to get your prescription updated if your vision changes.  Medicine review. Work with your health care provider to regularly review all of the medicines you are taking, including over-the-counter medicines. Ask your health care provider about any side effects that may make you more likely to have a fall. Tell your health care provider if any medicines that you take make you feel dizzy or sleepy.  Osteoporosis screening. Osteoporosis is a condition that causes the bones to get weaker. This can make the bones weak and cause them to break more easily.  Blood pressure screening. Blood pressure changes and medicines to control blood pressure can make you feel dizzy.  Strength and balance checks. Your health care provider may recommend certain tests to check your  strength and balance while standing, walking, or changing positions.  Foot health exam. Foot pain and numbness, as well as not wearing proper footwear, can make you more likely to have a fall.  Depression screening. You may be more likely to have a fall if you have a fear of falling, feel emotionally low, or feel unable to do activities that you used to do.  Alcohol use screening. Using too much alcohol can affect your balance and may make you more likely to have a fall. What actions can I take to lower my risk of falls? General instructions  Talk with your health care provider about your risks for falling. Tell your health care provider if: ? You fall. Be sure to tell your health care provider about all falls, even ones that seem minor. ? You feel dizzy, sleepy, or off-balance.  Take over-the-counter and prescription medicines only as told by your health care provider. These include any supplements.  Eat a healthy diet and maintain a healthy weight. A healthy diet includes low-fat dairy products, low-fat (lean) meats, and fiber from whole grains, beans, and lots of fruits and vegetables. Home safety  Remove any tripping hazards, such as rugs, cords, and clutter.  Install safety equipment such as grab bars in bathrooms and safety rails on stairs.  Keep rooms and walkways well-lit. Activity   Follow a regular exercise program to stay fit. This will help you maintain your balance. Ask your health care provider what types of exercise are appropriate for you.  If you need a cane or   walker, use it as recommended by your health care provider.  Wear supportive shoes that have nonskid soles. Lifestyle  Do not drink alcohol if your health care provider tells you not to drink.  If you drink alcohol, limit how much you have: ? 0-1 drink a day for women. ? 0-2 drinks a day for men.  Be aware of how much alcohol is in your drink. In the [place] one drink equals one typical bottle of beer (12  oz), one-half glass of wine (5 oz), or one shot of hard liquor (1 oz).  Do not use any products that contain nicotine or tobacco, such as cigarettes and e-cigarettes. If you need help quitting, ask your health care provider. Summary  Having a healthy lifestyle and getting preventive care can help to protect your health and wellness after age 83.  Screening and testing are the best way to find a health problem early and help you avoid having a fall. Early diagnosis and treatment give you the best chance for managing medical conditions that are more common for people who are older than age 83.  Falls are a major cause of broken bones and head injuries in people who are older than age 83. Take precautions to prevent a fall at home.  Work with your health care provider to learn what changes you can make to improve your health and wellness and to prevent falls. This information is not intended to replace advice given to you by your health care provider. Make sure you discuss any questions you have with your health care provider. Document Released: 09/16/2017 Document Revised: 09/16/2017 Document Reviewed: 09/16/2017 Elsevier Interactive Patient Education  2019 [employer]  

## 2018-12-27 NOTE — Progress Notes (Signed)
Patient: Helen Ramsey Female    DOB: 1933-11-01   85 y.o.   MRN: [id number] Visit Date: 12/27/2018  Today's Provider: Mar Daring, PA-C   Chief Complaint  Patient presents with  . Hypertension  . Gastroesophageal Reflux  . Depression  . Hyperlipidemia   Subjective:     HPI Patient here for 3 month follow-up on chronic issues.  Hypertension: Patient here for follow-up of elevated blood pressure. She is not exercising and is adherent to low salt diet.  Blood pressure is well controlled at home. Cardiac symptoms none. Patient denies none.  Cardiovascular risk factors: advanced age (older than 92 for men, 58 for women), dyslipidemia and hypertension.    GERD, Follow up:  The patient was last seen for this a  months ago. Changes made since that visit include none.  She reports excellent compliance with treatment. She is not having side effects.  She IS experiencing abdominal bloating and heartburn. She is NOT experiencing chest pain, choking on food, cough, deep pressure at base of neck or difficulty swallowing  ------------------------------------------------------------------------   Depression, Follow up:  The patient was last seen for this a  months ago. Changes made since that visit include none.Continue Citalopram and Xanax.  She reports excellent compliance with treatment. She is not having side effects.   Lipid/Cholesterol, Follow-up:   Last seen for this a few months ago.  Management changes since that visit include none.Continue Atorvastatin. . Last Lipid Panel:    Component Value Date/Time   CHOL 166 05/11/2018 1103   TRIG 191 (H) 05/11/2018 1103   HDL 41 05/11/2018 1103   CHOLHDL 4.0 05/11/2018 1103   LDLCALC 87 05/11/2018 1103    Risk factors for vascular disease include hypercholesterolemia and hypertension  She reports excellent compliance with treatment. She is not having side effects.  Current symptoms include none and have  been stable.  Wt Readings from Last 3 Encounters:  12/27/18 179 lb 3.2 oz (81.3 kg)  09/28/18 174 lb 1.6 oz (79 kg)  09/20/18 176 lb (79.8 kg)   -------------------------------------------------------------------     Allergies  Allergen Reactions  . Amoxicillin   . Clindamycin/Lincomycin   . Doxycycline   . Levaquin [Levofloxacin In D5w]   . Mirabegron Nausea And Vomiting  . Nsaids Other (See Comments)    GI upset  . Oxycodone Nausea Only    Other reaction(s): Hallucination  . Phenergan [Promethazine Hcl]   . Singulair [Montelukast]   . Vicodin [Hydrocodone-Acetaminophen]   . Zocor [Simvastatin]   . Zoloft  [Sertraline] Other (See Comments)  . Ampicillin Rash  . Bactrim [Sulfamethoxazole-Trimethoprim] Rash  . Penicillins Rash    Ampicillin, Clindamycin, Doxycycline Levaquin-severe headache, muscle and joint aches, weak  . Tolmetin     Other reaction(s): Other (See Comments) GI upset     Current Outpatient Medications:  .  acetaminophen (TYLENOL) 500 MG tablet, Take 500 mg by mouth daily. , Disp: , Rfl:  .  ALPRAZolam (XANAX) 0.25 MG tablet, TAKE ONE TABLET BY MOUTH TWO TIMES A DAY AS NEEDED FOR ANXIETY, Disp: 60 tablet, Rfl: 5 .  aspirin EC 81 MG tablet, Take 81 mg by mouth daily., Disp: , Rfl:  .  atorvastatin (LIPITOR) 20 MG tablet, TAKE ONE (1) TABLET EACH DAY AT 6 IN THE EVENING, Disp: 90 tablet, Rfl: 1 .  azelastine (ASTELIN) 0.1 % nasal spray, PUT ONE SPRAY INTO BOTH NOSTRILS AS NEEDED FOR RHINITIS., Disp: 30 mL, Rfl: 5 .  azithromycin (ZITHROMAX) 250 MG tablet, Take 2 tablets PO on day one, and one tablet PO daily thereafter until completed., Disp: 6 tablet, Rfl: 0 .  azithromycin (ZITHROMAX) 500 MG tablet, Take 500 mg once by mouth., Disp: , Rfl:  .  Calcium Carbonate-Vitamin D 600-400 MG-UNIT tablet, Take 1 tablet by mouth 2 (two) times daily., Disp: 180 tablet, Rfl: 1 .  cetirizine (ZYRTEC) 10 MG tablet, Take 10 mg daily by mouth., Disp: , Rfl:  .   citalopram (CELEXA) 40 MG tablet, TAKE ONE (1) TABLET BY MOUTH EVERY DAY, Disp: 90 tablet, Rfl: 1 .  cyanocobalamin 1000 MCG tablet, Take 1,000 mcg by mouth daily., Disp: , Rfl:  .  Dentifrices (FLUORIDE TOOTHPASTE DT), Place onto teeth., Disp: , Rfl:  .  docusate sodium (COLACE) 100 MG capsule, Take 300 mg by mouth 2 (two) times daily as needed for mild constipation. , Disp: , Rfl:  .  EMGALITY 120 MG/ML SOAJ, , Disp: , Rfl: 0 .  furosemide (LASIX) 20 MG tablet, Take 1 tablet (20 mg total) by mouth daily. May take extra tab if weight up greater 3 pounds, Disp: 180 tablet, Rfl: 1 .  gabapentin (NEURONTIN) 100 MG capsule, Start Gabapentin 100 mg (one tablet) in the morning, 100 mg in the afternoon (one tablet), and 200 mg (two tablets) in the evening., Disp: 360 capsule, Rfl: 1 .  lisinopril (PRINIVIL,ZESTRIL) 5 MG tablet, TAKE ONE (1) TABLET EACH DAY, Disp: 90 tablet, Rfl: 1 .  Magnesium Oxide, Antacid, 500 MG CAPS, Take 1 tablet by mouth as needed., Disp: , Rfl:  .  Melatonin 1 MG TABS, Take 1 tablet by mouth as needed. , Disp: , Rfl:  .  Omega-3 Fatty Acids (FISH OIL) 1200 MG CAPS, Take 2 capsules by mouth daily. , Disp: , Rfl:  .  omeprazole (PRILOSEC) 20 MG capsule, Take 1 capsule (20 mg total) by mouth daily., Disp: 90 capsule, Rfl: 1 .  phenazopyridine (AZO-TABS) 95 MG tablet, Take 1 tablet (95 mg total) by mouth as needed., Disp: 30 tablet, Rfl: 3 .  polyethylene glycol powder (GLYCOLAX/MIRALAX) powder, Take 17 g by mouth 2 (two) times daily as needed., Disp: 527 g, Rfl: 1 .  POTASSIUM GLUCONATE PO, Take 1 tablet by mouth 2 (two) times daily. , Disp: , Rfl:  .  Simethicone (GAS RELIEF PO), Take by mouth as needed. , Disp: , Rfl:  .  sodium chloride (OCEAN) 0.65 % nasal spray, Reported on 01/30/2016, Disp: , Rfl:  .  traMADol (ULTRAM) 50 MG tablet, TAKE ONE TABLET BY MOUTH TWO TIMES A DAY AS NEEDED, Disp: 180 tablet, Rfl: 1 .  ranitidine (ZANTAC) 150 MG tablet, TAKE ONE TABLET TWICE DAILY  (Patient not taking: No sig reported), Disp: 180 tablet, Rfl: 1  Review of Systems  Constitutional: Negative.   HENT: Negative.   Respiratory: Negative.   Cardiovascular: Negative.   Gastrointestinal: Negative.   Genitourinary: Negative.   Musculoskeletal: Positive for back pain.  Neurological: Negative.     Social History   Tobacco Use  . Smoking status: Never Smoker  . Smokeless tobacco: Never Used  Substance Use Topics  . Alcohol use: No      Objective:   BP 136/71 (BP Location: Left Arm, Patient Position: Sitting, Cuff Size: Normal)   Pulse 63   Temp 98.1 F (36.7 C) (Oral)   Wt 179 lb 3.2 oz (81.3 kg)   SpO2 96%   BMI 36.19 kg/m  Vitals:   12/27/18 0857  BP: 136/71  Pulse: 63  Temp: 98.1 F (36.7 C)  TempSrc: Oral  SpO2: 96%  Weight: 179 lb 3.2 oz (81.3 kg)     Physical Exam Vitals signs reviewed.  Constitutional:      General: She is not in acute distress.    Appearance: She is well-developed. She is not diaphoretic.  HENT:     Head: Normocephalic and atraumatic.     Right Ear: Hearing, tympanic membrane, ear canal and external ear normal.     Left Ear: Hearing, tympanic membrane, ear canal and external ear normal.     Nose: No congestion.     Right Sinus: Maxillary sinus tenderness and frontal sinus tenderness present.     Left Sinus: Maxillary sinus tenderness and frontal sinus tenderness present.     Mouth/Throat:     Mouth: Mucous membranes are moist.     Pharynx: Uvula midline. No oropharyngeal exudate or posterior oropharyngeal erythema.  Eyes:     Pupils: Pupils are equal, round, and reactive to light.  Neck:     Musculoskeletal: Normal range of motion and neck supple.     Thyroid: No thyromegaly.     Trachea: No tracheal deviation.  Cardiovascular:     Rate and Rhythm: Normal rate and regular rhythm.     Heart sounds: Murmur present. No friction rub. No gallop.   Pulmonary:     Effort: Pulmonary effort is normal. No respiratory  distress.     Breath sounds: Normal breath sounds. No stridor. No wheezing or rales.  Lymphadenopathy:     Cervical: No cervical adenopathy.        Assessment & Plan    1. Essential (primary) hypertension Stable. Continue lisinopril 5mg . Will check labs as below and f/u pending results. - CBC w/Diff/Platelet - Comprehensive Metabolic Panel (CMET)  2. Recurrent major depressive disorder, in partial remission (HCC) Improving on citalopram 40mg  daily.   3. Familial multiple lipoprotein-type hyperlipidemia Stable on atorvastatin 20mg . Will check labs as below and f/u pending results. - Comprehensive Metabolic Panel (CMET) - Lipid Profile  4. Gastroesophageal reflux disease without esophagitis Improved and avoids trigger foods. Continue prilosec 20mg  and change ranitidine to famotidine. Will check labs as below and f/u pending results. - CBC w/Diff/Platelet - Comprehensive Metabolic Panel (CMET) - famotidine (PEPCID) 20 MG tablet; Take 1 tablet (20 mg total) by mouth daily as needed for heartburn or indigestion.  Dispense: 90 tablet; Refill: 1  5. Class 2 severe obesity due to excess calories with serious comorbidity and body mass index (BMI) of 36.0 to 36.9 in adult [facility]) Counseled patient on healthy lifestyle modifications including dieting and exercise.  - CBC w/Diff/Platelet - Comprehensive Metabolic Panel (CMET) - Lipid Profile  6. Atherosclerosis of aorta (HCC) Stable. Continue atorvastatin. Will check labs as below and f/u pending results. - Lipid Profile  7. Vitamin D deficiency H/O this. Post menopausal. Will check labs as below and f/u pending results. - Vitamin D (25 hydroxy)  8. Chronic pansinusitis Not improving, but patient has been out of astelin nasal spray and stopped using flonase due to burning. Will change to nasonex and refill astelin.  - azelastine (ASTELIN) 0.1 % nasal spray; Place 1 spray into both nostrils 2 (two) times daily. Use in each nostril as  directed  Dispense: 30 mL; Refill: 5 - mometasone (NASONEX) 50 MCG/ACT nasal spray; Place 2 sprays into the nose daily.  Dispense: 17 g; Refill: 12  9. Elevated hemoglobin A1c Will check labs as below  and f/u pending results. - HgB A1c  10. Lower extremity edema Improved. Diagnosis pulled for medication refill. Continue current medical treatment plan. - potassium gluconate 595 (99 K) MG TABS tablet; Take 1 tablet (595 mg total) by mouth 2 (two) times daily.  Dispense: 180 tablet; Refill: [place], PA-C  [facility]

## 2018-12-28 LAB — LIPID PANEL
Chol/HDL Ratio: 3 ratio (ref 0.0–4.4)
Cholesterol, Total: 164 mg/dL (ref 100–199)
HDL: 54 mg/dL (ref >39)
LDL Calculated: 89 mg/dL (ref 0–99)
Triglycerides: 107 mg/dL (ref 0–149)
VLDL Cholesterol Cal: 21 mg/dL (ref 5–40)

## 2018-12-28 LAB — CBC WITH DIFFERENTIAL/PLATELET
Basophils Absolute: 0.1 10*3/uL (ref 0.0–0.2)
Basos: 1 %
EOS (ABSOLUTE): 0.1 10*3/uL (ref 0.0–0.4)
Eos: 2 %
Hematocrit: 39.7 % (ref 34.0–46.6)
Hemoglobin: 13.4 g/dL (ref 11.1–15.9)
Immature Grans (Abs): 0.1 10*3/uL (ref 0.0–0.1)
Immature Granulocytes: 1 %
Lymphocytes Absolute: 1.8 10*3/uL (ref 0.7–3.1)
Lymphs: 22 %
MCH: 30 pg (ref 26.6–33.0)
MCHC: 33.8 g/dL (ref 31.5–35.7)
MCV: 89 fL (ref 79–97)
Monocytes Absolute: 0.7 10*3/uL (ref 0.1–0.9)
Monocytes: 9 %
NEUTROS ABS: 5.7 10*3/uL (ref 1.4–7.0)
Neutrophils: 65 %
Platelets: 246 10*3/uL (ref 150–450)
RBC: 4.46 x10E6/uL (ref 3.77–5.28)
RDW: 13.2 % (ref 11.7–15.4)
WBC: 8.5 10*3/uL (ref 3.4–10.8)

## 2018-12-28 LAB — COMPREHENSIVE METABOLIC PANEL
A/G RATIO: 2 (ref 1.2–2.2)
ALT: 19 IU/L (ref 0–32)
AST: 16 IU/L (ref 0–40)
Albumin: 4.5 g/dL (ref 3.6–4.6)
Alkaline Phosphatase: 63 IU/L (ref 39–117)
BUN / CREAT RATIO: 34 — AB (ref 12–28)
BUN: 27 mg/dL (ref 8–27)
Bilirubin Total: 0.3 mg/dL (ref 0.0–1.2)
CO2: 26 mmol/L (ref 20–29)
Calcium: 10.2 mg/dL (ref 8.7–10.3)
Chloride: 99 mmol/L (ref 96–106)
Creatinine, Ser: 0.8 mg/dL (ref 0.57–1.00)
GFR calc Af Amer: 79 mL/min/{1.73_m2} (ref >59)
GFR calc non Af Amer: 68 mL/min/{1.73_m2} (ref >59)
Globulin, Total: 2.3 g/dL (ref 1.5–4.5)
Glucose: 86 mg/dL (ref 65–99)
Potassium: 4.5 mmol/L (ref 3.5–5.2)
SODIUM: 141 mmol/L (ref 134–144)
Total Protein: 6.8 g/dL (ref 6.0–8.5)

## 2018-12-28 LAB — HEMOGLOBIN A1C
ESTIMATED AVERAGE GLUCOSE: 120 mg/dL
Hgb A1c MFr Bld: 5.8 % — ABNORMAL HIGH (ref 4.8–5.6)

## 2018-12-28 LAB — VITAMIN D 25 HYDROXY (VIT D DEFICIENCY, FRACTURES): VIT D 25 HYDROXY: 39.5 ng/mL (ref 30.0–100.0)

## 2018-12-29 ENCOUNTER — Telehealth: Payer: Self-pay

## 2018-12-29 NOTE — Telephone Encounter (Signed)
Patient advised as directed below.  Thanks,  -Zyaira Vejar 

## 2018-12-29 NOTE — Telephone Encounter (Signed)
-----   Message from Mar Daring, [place] sent at 12/29/2018  7:50 AM EST ----- Blood count is normal. Kidney and liver function normal. A1c increased from 5.5 to 5.8. Keep limiting sugars and carbs from diet. Cholesterol normal. Vit D is normal, but continue Vit D supplement OTC.

## 2018-12-30 DIAGNOSIS — D485 Neoplasm of uncertain behavior of skin: Secondary | ICD-10-CM | POA: Diagnosis not present

## 2019-01-18 ENCOUNTER — Telehealth: Payer: Self-pay

## 2019-01-18 NOTE — Telephone Encounter (Signed)
[name] calling that Trecia has been complaining about chest tightness and heaviness for the past 2 weeks off an on. She has been taking her lasix and took it yesterday. Patient's blood pressure has been running between 143/68 and 145/63. Per [name] patient doesn't have any other symptoms feels that this is more acid reflux. [name] was told by Linus Salmons cma to go the ER and but they declined, after talking to [place] and explaining with Shaneya's history and her symptoms that it is best to be seen at the ER. I scheduled patient for tomorrow but went ahead and addressed that if patient's complains her chest feels heavy or more tightness that she might be retaining more fluid and if sob or chest pain or dizziness that patient needs to go to the ED and not wait for tomorrow's appointment.[name] agreed.

## 2019-01-18 NOTE — Telephone Encounter (Signed)
Noted  

## 2019-01-19 ENCOUNTER — Encounter: Payer: Self-pay | Admitting: Physician Assistant

## 2019-01-19 ENCOUNTER — Ambulatory Visit (INDEPENDENT_AMBULATORY_CARE_PROVIDER_SITE_OTHER): Payer: Medicare Other | Admitting: Physician Assistant

## 2019-01-19 ENCOUNTER — Telehealth: Payer: Self-pay

## 2019-01-19 ENCOUNTER — Ambulatory Visit
Admission: RE | Admit: 2019-01-19 | Discharge: 2019-01-19 | Disposition: A | Discharge status: Home / Self Care | Payer: Medicare Other | Attending: Physician Assistant | Admitting: Physician Assistant

## 2019-01-19 ENCOUNTER — Ambulatory Visit
Admission: RE | Admit: 2019-01-19 | Discharge: 2019-01-19 | Disposition: A | Discharge status: Home / Self Care | Payer: Medicare Other | Source: Ambulatory Visit | Attending: Physician Assistant | Admitting: Physician Assistant

## 2019-01-19 VITALS — BP 153/75 | HR 67 | Temp 98.4°F | Resp 16 | Wt 182.0 lb

## 2019-01-19 DIAGNOSIS — R0602 Shortness of breath: Secondary | ICD-10-CM

## 2019-01-19 DIAGNOSIS — F411 Generalized anxiety disorder: Secondary | ICD-10-CM | POA: Diagnosis not present

## 2019-01-19 DIAGNOSIS — R079 Chest pain, unspecified: Secondary | ICD-10-CM | POA: Insufficient documentation

## 2019-01-19 DIAGNOSIS — I10 Essential (primary) hypertension: Secondary | ICD-10-CM

## 2019-01-19 DIAGNOSIS — R05 Cough: Secondary | ICD-10-CM | POA: Insufficient documentation

## 2019-01-19 DIAGNOSIS — E782 Mixed hyperlipidemia: Secondary | ICD-10-CM | POA: Diagnosis not present

## 2019-01-19 DIAGNOSIS — R600 Localized edema: Secondary | ICD-10-CM | POA: Diagnosis not present

## 2019-01-19 DIAGNOSIS — K219 Gastro-esophageal reflux disease without esophagitis: Secondary | ICD-10-CM | POA: Diagnosis not present

## 2019-01-19 MED ORDER — LISINOPRIL 5 MG PO TABS: ORAL_TABLET | ORAL | 1 refills | Status: DC

## 2019-01-19 MED ORDER — FUROSEMIDE 20 MG PO TABS: 20.0000 mg | ORAL_TABLET | Freq: Every day | ORAL | 1 refills | Status: DC

## 2019-01-19 MED ORDER — ATORVASTATIN CALCIUM 20 MG PO TABS: ORAL_TABLET | ORAL | 1 refills | Status: DC

## 2019-01-19 MED ORDER — OMEPRAZOLE 20 MG PO CPDR: 20.0000 mg | DELAYED_RELEASE_CAPSULE | Freq: Every day | ORAL | 1 refills | Status: DC

## 2019-01-19 NOTE — Patient Instructions (Signed)
10 Relaxation Techniques That Zap Stress Fast By Jeannette Moninger   Listen  Relax. You deserve it, it's good for you, and it takes less time than you think. You don't need a spa weekend or a retreat. Each of these stress-relieving tips can get you from OMG to om in less than 15 minutes. 1. Meditate  A few minutes of practice per day can help ease anxiety. "Research suggests that daily meditation may alter the brain's neural pathways, making you more resilient to stress," says psychologist Robbie Maller Hartman, PhD, a [place] health and wellness coach. It's simple. Sit up straight with both feet on the floor. Close your eyes. Focus your attention on reciting -- out loud or silently -- a positive mantra such as "I feel at peace" or "I love myself." Place one hand on your belly to sync the mantra with your breaths. Let any distracting thoughts float by like clouds. 2. Breathe Deeply  Take a 5-minute break and focus on your breathing. Sit up straight, eyes closed, with a hand on your belly. Slowly inhale through your nose, feeling the breath start in your abdomen and work its way to the top of your head. Reverse the process as you exhale through your mouth.  "Deep breathing counters the effects of stress by slowing the heart rate and lowering blood pressure," psychologist Judith Tutin, PhD, says. She's a certified life coach in [place], [place] 3. Be Present  Slow down.  "Take 5 minutes and focus on only one behavior with awareness," Tutin says. Notice how the air feels on your face when you're walking and how your feet feel hitting the ground. Enjoy the texture and taste of each bite of food. When you spend time in the moment and focus on your senses, you should feel less tense. 4. Reach Out  Your social network is one of your best tools for handling stress. Talk to others -- preferably face to face, or at least on the phone. Share what's going on. You can get a fresh perspective while keeping your  connection strong. 5. Tune In to Your Body  Mentally scan your body to get a sense of how stress affects it each day. Lie on your back, or sit with your feet on the floor. Start at your toes and work your way up to your scalp, noticing how your body feels.  10 Relaxation Techniques That Zap Stress Fast By Jeannette Moninger   Listen  "Simply be aware of places you feel tight or loose without trying to change anything," Tutin says. For 1 to 2 minutes, imagine each deep breath flowing to that body part. Repeat this process as you move your focus up your body, paying close attention to sensations you feel in each body part. 6. Decompress  Place a warm heat wrap around your neck and shoulders for 10 minutes. Close your eyes and relax your face, neck, upper chest, and back muscles. Remove the wrap, and use a tennis ball or foam roller to massage away tension.  "Place the ball between your back and the wall. Lean into the ball, and hold gentle pressure for up to 15 seconds. Then move the ball to another spot, and apply pressure," says Cathy Benninger, a nurse practitioner and assistant professor at [employer] [facility] in [place]. 7. Laugh Out Loud  A good belly laugh doesn't just lighten the load mentally. It lowers cortisol, your body's stress hormone, and boosts brain chemicals called endorphins, which help   your mood. Lighten up by tuning in to your favorite sitcom or video, reading the comics, or chatting with someone who makes you smile. 8. Crank Up the Tunes  Research shows that listening to soothing music can lower blood pressure, heart rate, and anxiety. "Create a playlist of songs or nature sounds (the ocean, a bubbling brook, birds chirping), and allow your mind to focus on the different melodies, instruments, or singers in the piece," Benninger says. You also can blow off steam by rocking out to more upbeat tunes -- or singing at the top of your lungs! 9. Get Moving   You don't have to run in order to get a runner's high. All forms of exercise, including yoga and walking, can ease depression and anxiety by helping the brain release feel-good chemicals and by giving your body a chance to practice dealing with stress. You can go for a quick walk around the block, take the stairs up and down a few flights, or do some stretching exercises like head rolls and shoulder shrugs. 10. Be Grateful  Keep a gratitude journal or several (one by your bed, one in your purse, and one at work) to help you remember all the things that are good in your life.  "Being grateful for your blessings cancels out negative thoughts and worries," says Joni Emmerling, a wellness coach in [place], [place].  Use these journals to savor good experiences like a child's smile, a sunshine-filled day, and good health. Don't forget to celebrate accomplishments like mastering a new task at work or a new hobby. When you start feeling stressed, spend a few minutes looking through your notes to remind yourself what really matters.  

## 2019-01-19 NOTE — Telephone Encounter (Signed)
Patient has viewed result on mychart.

## 2019-01-19 NOTE — Progress Notes (Signed)
Patient: Helen Ramsey Female    DOB: [birth year]   85 y.o.   MRN: [id number] Visit Date: 01/19/2019  Today's Provider: Mar Daring, PA-C   No chief complaint on file.  Subjective:     HPI Patient here today c/o "chest tightness" on and off x's 1 week. Patient reports that she has been taking Pepcid and omeprazole. Patient reports some relief of symptoms. Patient reports some nausea and stomach empty feeling. Patient denies vomiting or burping more.   Patient c/o elevated blood pressure in the last few days. Patient reports some swelling in her knees and some shortness of breath. Patient denies any abnormal weight gain. Denies any worsening lower extremity swelling. SOB and chest tightness is no worse with walking and normal activities.  Does report she has had increased worry recently. She is reapplying for her rent assistance and is worried she will not get that. Then she has misplaced her form for assistance with medications.   Allergies  Allergen Reactions  . Amoxicillin   . Clindamycin/Lincomycin   . Doxycycline   . Levaquin [Levofloxacin In D5w]   . Mirabegron Nausea And Vomiting  . Nsaids Other (See Comments)    GI upset  . Oxycodone Nausea Only    Other reaction(s): Hallucination  . Phenergan [Promethazine Hcl]   . Singulair [Montelukast]   . Vicodin [Hydrocodone-Acetaminophen]   . Zocor [Simvastatin]   . Zoloft  [Sertraline] Other (See Comments)  . Ampicillin Rash  . Bactrim [Sulfamethoxazole-Trimethoprim] Rash  . Penicillins Rash    Ampicillin, Clindamycin, Doxycycline Levaquin-severe headache, muscle and joint aches, weak  . Tolmetin     Other reaction(s): Other (See Comments) GI upset     Current Outpatient Medications:  .  acetaminophen (TYLENOL) 500 MG tablet, Take 500 mg by mouth daily. , Disp: , Rfl:  .  ALPRAZolam (XANAX) 0.25 MG tablet, TAKE ONE TABLET BY MOUTH TWO TIMES A DAY AS NEEDED FOR ANXIETY, Disp: 60 tablet, Rfl: 5 .  aspirin  EC 81 MG tablet, Take 81 mg by mouth daily., Disp: , Rfl:  .  atorvastatin (LIPITOR) 20 MG tablet, TAKE ONE (1) TABLET EACH DAY AT 6 IN THE EVENING, Disp: 90 tablet, Rfl: 1 .  azelastine (ASTELIN) 0.1 % nasal spray, Place 1 spray into both nostrils 2 (two) times daily. Use in each nostril as directed, Disp: 30 mL, Rfl: 5 .  Calcium Carbonate-Vitamin D 600-400 MG-UNIT tablet, Take 1 tablet by mouth 2 (two) times daily., Disp: 180 tablet, Rfl: 1 .  cetirizine (ZYRTEC) 10 MG tablet, Take 10 mg daily by mouth., Disp: , Rfl:  .  citalopram (CELEXA) 40 MG tablet, TAKE ONE (1) TABLET BY MOUTH EVERY DAY, Disp: 90 tablet, Rfl: 1 .  cyanocobalamin 1000 MCG tablet, Take 1,000 mcg by mouth daily., Disp: , Rfl:  .  Dentifrices (FLUORIDE TOOTHPASTE DT), Place onto teeth., Disp: , Rfl:  .  docusate sodium (COLACE) 100 MG capsule, Take 300 mg by mouth 2 (two) times daily as needed for mild constipation. , Disp: , Rfl:  .  EMGALITY 120 MG/ML SOAJ, , Disp: , Rfl: 0 .  famotidine (PEPCID) 20 MG tablet, Take 1 tablet (20 mg total) by mouth daily as needed for heartburn or indigestion., Disp: 90 tablet, Rfl: 1 .  furosemide (LASIX) 20 MG tablet, Take 1 tablet (20 mg total) by mouth daily. May take extra tab if weight up greater 3 pounds, Disp: 180 tablet, Rfl: 1 .  gabapentin (NEURONTIN) 100 MG capsule, Start Gabapentin 100 mg (one tablet) in the morning, 100 mg in the afternoon (one tablet), and 200 mg (two tablets) in the evening., Disp: 360 capsule, Rfl: 1 .  lisinopril (PRINIVIL,ZESTRIL) 5 MG tablet, TAKE ONE (1) TABLET EACH DAY, Disp: 90 tablet, Rfl: 1 .  Magnesium Oxide, Antacid, 500 MG CAPS, Take 1 tablet by mouth as needed., Disp: , Rfl:  .  Melatonin 1 MG TABS, Take 1 tablet by mouth as needed. , Disp: , Rfl:  .  mometasone (NASONEX) 50 MCG/ACT nasal spray, Place 2 sprays into the nose daily., Disp: 17 g, Rfl: 12 .  Omega-3 Fatty Acids (FISH OIL) 1200 MG CAPS, Take 2 capsules by mouth daily. , Disp: , Rfl:  .   omeprazole (PRILOSEC) 20 MG capsule, Take 1 capsule (20 mg total) by mouth daily., Disp: 90 capsule, Rfl: 1 .  phenazopyridine (AZO-TABS) 95 MG tablet, Take 1 tablet (95 mg total) by mouth as needed., Disp: 30 tablet, Rfl: 3 .  polyethylene glycol powder (GLYCOLAX/MIRALAX) powder, Take 17 g by mouth 2 (two) times daily as needed., Disp: 527 g, Rfl: 1 .  potassium gluconate 595 (99 K) MG TABS tablet, Take 1 tablet (595 mg total) by mouth 2 (two) times daily., Disp: 180 tablet, Rfl: 1 .  Simethicone (GAS RELIEF PO), Take by mouth as needed. , Disp: , Rfl:  .  sodium chloride (OCEAN) 0.65 % nasal spray, Reported on 01/30/2016, Disp: , Rfl:  .  traMADol (ULTRAM) 50 MG tablet, TAKE ONE TABLET BY MOUTH TWO TIMES A DAY AS NEEDED, Disp: 180 tablet, Rfl: 1  Review of Systems  Constitutional: Negative.   HENT: Negative.   Respiratory: Positive for cough, chest tightness and shortness of breath.   Cardiovascular: Positive for leg swelling.  Gastrointestinal: Negative.   Neurological: Negative.   Psychiatric/Behavioral: The patient is nervous/anxious.     Social History   Tobacco Use  . Smoking status: Never Smoker  . Smokeless tobacco: Never Used  Substance Use Topics  . Alcohol use: No      Objective:   BP (!) 153/75 (BP Location: Left Arm, Patient Position: Sitting, Cuff Size: Normal)   Pulse 67   Temp 98.4 F (36.9 C) (Oral)   Resp 16   Wt 182 lb (82.6 kg)   SpO2 99%   BMI 36.76 kg/m  Vitals:   01/19/19 1035  BP: (!) 153/75  Pulse: 67  Resp: 16  Temp: 98.4 F (36.9 C)  TempSrc: Oral  SpO2: 99%  Weight: 182 lb (82.6 kg)     Physical Exam Vitals signs reviewed.  Constitutional:      General: She is not in acute distress.    Appearance: She is well-developed. She is obese. She is not ill-appearing or diaphoretic.  HENT:     Head: Normocephalic and atraumatic.  Eyes:     Pupils: Pupils are equal, round, and reactive to light.  Neck:     Musculoskeletal: Normal range of  motion and neck supple.     Thyroid: No thyromegaly.     Vascular: No JVD.     Trachea: No tracheal deviation.  Cardiovascular:     Rate and Rhythm: Normal rate and regular rhythm.     Heart sounds: Normal heart sounds. No murmur. No friction rub. No gallop.   Pulmonary:     Effort: Pulmonary effort is normal. No respiratory distress.     Breath sounds: Normal breath sounds. No wheezing or rales.  Abdominal:     General: Abdomen is flat. Bowel sounds are normal. There is no distension.     Tenderness: There is no abdominal tenderness.  Musculoskeletal:     Right lower leg: Edema (trace) present.     Left lower leg: Edema (trace) present.  Lymphadenopathy:     Cervical: No cervical adenopathy.  Neurological:     Mental Status: She is alert.  Psychiatric:        Mood and Affect: Mood is anxious.         Assessment & Plan    1. Chest pain, unspecified type EKG today NSR rate of 65. Will check labs and CXR as below. Suspect symptoms more associated with anxiety, but since there is a strong family history of CAD and strokes, will evaluate to rule out other sources. I will f/u pending results. Patient and daughter-in-law are going to reach out to Dr. Nicolasa Ducking as well. She is in agreement with plan. - EKG 12-Lead - CBC w/Diff/Platelet - Basic Metabolic Panel (BMET) - DG Chest 2 View; Future - B Nat Peptide  2. SOB (shortness of breath) See above medical treatment plan. - CBC w/Diff/Platelet - Basic Metabolic Panel (BMET) - DG Chest 2 View; Future - B Nat Peptide  3. Essential (primary) hypertension Stable. Continue current medications.  - CBC w/Diff/Platelet - Basic Metabolic Panel (BMET) - B Nat Peptide - lisinopril (PRINIVIL,ZESTRIL) 5 MG tablet; TAKE ONE (1) TABLET EACH DAY  Dispense: 90 tablet; Refill: 1  4. Gastroesophageal reflux disease without esophagitis Stable. Diagnosis pulled for medication refill. Continue current medical treatment plan. - omeprazole (PRILOSEC)  20 MG capsule; Take 1 capsule (20 mg total) by mouth daily.  Dispense: 90 capsule; Refill: 1  5. Mixed hyperlipidemia Stable. Diagnosis pulled for medication refill. Continue current medical treatment plan. - atorvastatin (LIPITOR) 20 MG tablet; TAKE ONE (1) TABLET EACH DAY AT 6 IN THE EVENING  Dispense: 90 tablet; Refill: 1  6. Bilateral lower extremity edema Stable. Diagnosis pulled for medication refill. Continue current medical treatment plan. - furosemide (LASIX) 20 MG tablet; Take 1 tablet (20 mg total) by mouth daily. May take extra tab if weight up greater 3 pounds  Dispense: 180 tablet; Refill: 1  7. GAD (generalized anxiety disorder) Suspect source of chest tightness and reflux symptoms. She uses alprazolam prn and citalopram 40mg  daily. Will f/u with Dr. Nicolasa Ducking.      Mar Daring, PA-C  [facility]

## 2019-01-19 NOTE — Telephone Encounter (Signed)
-----   Message from Mar Daring, PA-C sent at 01/19/2019  4:59 PM EST ----- CXR normal.

## 2019-01-20 LAB — CBC WITH DIFFERENTIAL/PLATELET
Basophils Absolute: 0 10*3/uL (ref 0.0–0.2)
Basos: 1 %
EOS (ABSOLUTE): 0.2 10*3/uL (ref 0.0–0.4)
Eos: 3 %
Hematocrit: 37.2 % (ref 34.0–46.6)
Hemoglobin: 12.7 g/dL (ref 11.1–15.9)
IMMATURE GRANS (ABS): 0 10*3/uL (ref 0.0–0.1)
Immature Granulocytes: 0 %
LYMPHS: 22 %
Lymphocytes Absolute: 1.4 10*3/uL (ref 0.7–3.1)
MCH: 30 pg (ref 26.6–33.0)
MCHC: 34.1 g/dL (ref 31.5–35.7)
MCV: 88 fL (ref 79–97)
Monocytes Absolute: 0.6 10*3/uL (ref 0.1–0.9)
Monocytes: 9 %
Neutrophils Absolute: 4.1 10*3/uL (ref 1.4–7.0)
Neutrophils: 65 %
Platelets: 222 10*3/uL (ref 150–450)
RBC: 4.23 x10E6/uL (ref 3.77–5.28)
RDW: 13.1 % (ref 11.7–15.4)
WBC: 6.3 10*3/uL (ref 3.4–10.8)

## 2019-01-20 LAB — BASIC METABOLIC PANEL
BUN/Creatinine Ratio: 22 (ref 12–28)
BUN: 17 mg/dL (ref 8–27)
CO2: 26 mmol/L (ref 20–29)
Calcium: 10 mg/dL (ref 8.7–10.3)
Chloride: 100 mmol/L (ref 96–106)
Creatinine, Ser: 0.76 mg/dL (ref 0.57–1.00)
GFR calc Af Amer: 84 mL/min/{1.73_m2} (ref >59)
GFR calc non Af Amer: 73 mL/min/{1.73_m2} (ref >59)
GLUCOSE: 83 mg/dL (ref 65–99)
Potassium: 4.6 mmol/L (ref 3.5–5.2)
Sodium: 138 mmol/L (ref 134–144)

## 2019-01-20 LAB — BRAIN NATRIURETIC PEPTIDE: BNP: 39.4 pg/mL (ref 0.0–100.0)

## 2019-01-21 ENCOUNTER — Telehealth: Payer: Self-pay

## 2019-01-21 NOTE — Telephone Encounter (Signed)
-----   Message from Mar Daring, [place] sent at 01/21/2019  1:51 PM EST ----- Blood count is normal. Kidney function, sugar and electrolytes normal. BNP (heart failure) normal.

## 2019-01-21 NOTE — Telephone Encounter (Signed)
Patient advised as below.  

## 2019-01-27 DIAGNOSIS — M48062 Spinal stenosis, lumbar region with neurogenic claudication: Secondary | ICD-10-CM | POA: Diagnosis not present

## 2019-01-27 DIAGNOSIS — M5416 Radiculopathy, lumbar region: Secondary | ICD-10-CM | POA: Diagnosis not present

## 2019-01-27 DIAGNOSIS — M5136 Other intervertebral disc degeneration, lumbar region: Secondary | ICD-10-CM | POA: Diagnosis not present

## 2019-02-14 ENCOUNTER — Other Ambulatory Visit: Payer: Self-pay | Admitting: Physician Assistant

## 2019-02-14 DIAGNOSIS — I10 Essential (primary) hypertension: Secondary | ICD-10-CM

## 2019-03-17 ENCOUNTER — Other Ambulatory Visit: Payer: Self-pay | Admitting: Physician Assistant

## 2019-03-17 DIAGNOSIS — E782 Mixed hyperlipidemia: Secondary | ICD-10-CM

## 2019-05-03 NOTE — Progress Notes (Signed)
Patient: Helen Ramsey, Female    DOB: 1933/07/23, 85 y.o.   MRN: [id number] Visit Date: 05/04/2019  Today's Provider: Mar Daring, PA-C   Chief Complaint  Patient presents with  . Medicare Wellness   Subjective:     Annual wellness visit Helen Ramsey is a 85 y.o. female. She feels well. She reports exercising none. She reports she is sleeping well. -----------------------------------------------------------   Review of Systems  Constitutional: Negative.   HENT: Negative.   Eyes: Negative.   Respiratory: Positive for shortness of breath ("always" nothing new). Negative for chest tightness and wheezing.   Cardiovascular: Negative.  Negative for chest pain and palpitations.  Gastrointestinal: Negative.   Endocrine: Negative.   Genitourinary: Negative.   Musculoskeletal: Negative.   Skin: Negative.   Allergic/Immunologic: Negative.   Neurological: Negative.   Hematological: Negative.   Psychiatric/Behavioral: Negative.     Social History   Socioeconomic History  . Marital status: Widowed    Spouse name: Not on file  . Number of children: Not on file  . Years of education: Not on file  . Highest education level: Not on file  Occupational History  . Not on file  Social Needs  . Financial resource strain: Not on file  . Food insecurity    Worry: Not on file    Inability: Not on file  . Transportation needs    Medical: Not on file    Non-medical: Not on file  Tobacco Use  . Smoking status: Never Smoker  . Smokeless tobacco: Never Used  Substance and Sexual Activity  . Alcohol use: No  . Drug use: No  . Sexual activity: Not on file  Lifestyle  . Physical activity    Days per week: Not on file    Minutes per session: Not on file  . Stress: Not on file  Relationships  . Social Herbalist on phone: Not on file    Gets together: Not on file    Attends religious service: Not on file    Active member of club or organization: Not  on file    Attends meetings of clubs or organizations: Not on file    Relationship status: Not on file  . Intimate partner violence    Fear of current or ex partner: Not on file    Emotionally abused: Not on file    Physically abused: Not on file    Forced sexual activity: Not on file  Other Topics Concern  . Not on file  Social History Narrative  . Not on file    Past Medical History:  Diagnosis Date  . Allergic rhinitis, cause unspecified   . Allergic rhinitis, cause unspecified   . Atherosclerosis of aorta ([place])   . Back pain    lumbar  . Degeneration of lumbar or lumbosacral intervertebral disc   . Diverticulitis of colon (without mention of hemorrhage)(562.11)   . Dizziness and giddiness   . Dysthymic disorder   . Headache(784.0)   . Myalgia and myositis, unspecified   . Osteoporosis, unspecified   . Other and unspecified hyperlipidemia   . Other diseases of nasal cavity and sinuses(478.19)   . Other specified disorders of bladder   . Syncope   . Trigger finger (acquired)   . Unspecified essential hypertension   . Unspecified sleep apnea   . Urinary frequency      Patient Active Problem List   Diagnosis Date Noted  .  Age-related osteoporosis without current pathological fracture 08/16/2018  . Gait instability 08/16/2018  . Blurred vision, right eye 08/16/2018  . Ocular pain, right eye 08/16/2018  . Dorsalgia 03/31/2016  . H/O total knee replacement 02/14/2016  . Systolic ejection murmur 70/62/3762  . Vitamin D deficiency 11/29/2015  . Allergy to other foods 11/29/2015  . Headache 10/28/2015  . Leg cramps 10/24/2015  . Cramp and spasm 10/24/2015  . Knee pain, bilateral 08/29/2015  . Sleep apnea 05/30/2015  . Restless legs syndrome 05/09/2015  . Allergic rhinitis 03/28/2015  . Anemia, unspecified 03/28/2015  . Anxiety and depression 03/28/2015  . Neck pain 03/28/2015  . Chronic headache 03/28/2015  . Depression 03/28/2015  . DDD (degenerative disc  disease), lumbar 03/28/2015  . Gastro-esophageal reflux disease without esophagitis 03/28/2015  . 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 Essential (primary) hypertension 03/28/2015  . Adaptive colitis 03/28/2015  . OP (osteoporosis) 03/28/2015  . Nasal septal perforation 03/28/2015  . Spinal stenosis 03/28/2015  . Gastric ulcer without hemorrhage or perforation 03/28/2015  . Cerebral artery occlusion with cerebral infarction ([place]) 03/28/2015  . Chronic sinusitis 03/28/2015  . Connective tissue and disc stenosis of intervertebral foramina of abdomen and other regions 03/28/2015  . Diverticulosis of large intestine without perforation or abscess without bleeding 03/28/2015  . Osteoarthritis 03/28/2015  . Other specified functional intestinal disorders 03/28/2015  . Hardening of the aorta (main artery of the heart) ([place]) 07/13/2014  . H/O adenomatous polyp of colon 06/27/2005    Past Surgical History:  Procedure Laterality Date  . ABDOMINAL HYSTERECTOMY    . ADENOIDECTOMY    . APPENDECTOMY    . ARTHROSCOPIC KNEE SURGERY    . CARDIAC CATHETERIZATION     [facility]  . CARPAL TUNNEL RELEASE    . CATARACT EXTRACTION W/ INTRAOCULAR LENS IMPLANT & ANTERIOR VITRECTOMY, BILATERAL    . CHOLECYSTECTOMY    . KNEE ARTHROPLASTY Right 01/30/2016   Procedure: COMPUTER ASSISTED TOTAL KNEE ARTHROPLASTY;  Surgeon: Dereck Leep, MD;  Location: [facility] ORS;  Service: Orthopedics;  Laterality: Right;  . R/O Lymph Nodes; Right Axilla    . ROTATOR CUFF REPAIR Right   . SPG block  03/20/2016   sphenopalatine ganglion and maxillary division of trigeminal nerve block block Dr. Manuella Ghazi  . TEMPORAL ARTERY BIOPSY / LIGATION    . TONSILLECTOMY    . TUBAL LIGATION      Her family history includes Colon polyps in her mother; Dementia in her father and mother; Depression in her mother; Heart disease in her brother, brother, father, and mother; Hypertension in her brother and brother. There is no  history of Kidney cancer or Bladder Cancer.   Current Outpatient Medications:  .  acetaminophen (TYLENOL) 500 MG tablet, Take 500 mg by mouth daily. , Disp: , Rfl:  .  ALPRAZolam (XANAX) 0.25 MG tablet, TAKE ONE TABLET BY MOUTH TWO TIMES A DAY AS NEEDED FOR ANXIETY, Disp: 60 tablet, Rfl: 5 .  aspirin EC 81 MG tablet, Take 81 mg by mouth daily., Disp: , Rfl:  .  atorvastatin (LIPITOR) 20 MG tablet, TAKE 1 TABLET BY MOUTH EVERY EVENING AT 6PM, Disp: 90 tablet, Rfl: 1 .  azelastine (ASTELIN) 0.1 % nasal spray, Place 1 spray into both nostrils 2 (two) times daily. Use in each nostril as directed, Disp: 30 mL, Rfl: 5 .  Calcium Carbonate-Vitamin D 600-400 MG-UNIT tablet, Take 1 tablet by mouth 2 (two) times daily., Disp: 180 tablet, Rfl: 1 .  cetirizine (ZYRTEC) 10  MG tablet, Take 10 mg daily by mouth., Disp: , Rfl:  .  citalopram (CELEXA) 40 MG tablet, TAKE ONE (1) TABLET BY MOUTH EVERY DAY, Disp: 90 tablet, Rfl: 1 .  cyanocobalamin 1000 MCG tablet, Take 1,000 mcg by mouth daily., Disp: , Rfl:  .  Dentifrices (FLUORIDE TOOTHPASTE DT), Place onto teeth., Disp: , Rfl:  .  docusate sodium (COLACE) 100 MG capsule, Take 300 mg by mouth 2 (two) times daily as needed for mild constipation. , Disp: , Rfl:  .  EMGALITY 120 MG/ML SOAJ, , Disp: , Rfl: 0 .  famotidine (PEPCID) 20 MG tablet, Take 1 tablet (20 mg total) by mouth daily as needed for heartburn or indigestion., Disp: 90 tablet, Rfl: 1 .  furosemide (LASIX) 20 MG tablet, Take 1 tablet (20 mg total) by mouth daily. May take extra tab if weight up greater 3 pounds, Disp: 180 tablet, Rfl: 1 .  gabapentin (NEURONTIN) 100 MG capsule, Start Gabapentin 100 mg (one tablet) in the morning, 100 mg in the afternoon (one tablet), and 200 mg (two tablets) in the evening., Disp: 360 capsule, Rfl: 1 .  lisinopril (PRINIVIL,ZESTRIL) 5 MG tablet, TAKE 1 TABLET BY MOUTH DAILY, Disp: 90 tablet, Rfl: 1 .  Magnesium Oxide, Antacid, 500 MG CAPS, Take 1 tablet by mouth as  needed., Disp: , Rfl:  .  Melatonin 1 MG TABS, Take 1 tablet by mouth as needed. , Disp: , Rfl:  .  Omega-3 Fatty Acids (FISH OIL) 1200 MG CAPS, Take 2 capsules by mouth daily. , Disp: , Rfl:  .  omeprazole (PRILOSEC) 20 MG capsule, Take 1 capsule (20 mg total) by mouth daily., Disp: 90 capsule, Rfl: 1 .  phenazopyridine (AZO-TABS) 95 MG tablet, Take 1 tablet (95 mg total) by mouth as needed., Disp: 30 tablet, Rfl: 3 .  polyethylene glycol powder (GLYCOLAX/MIRALAX) powder, Take 17 g by mouth 2 (two) times daily as needed., Disp: 527 g, Rfl: 1 .  potassium gluconate 595 (99 K) MG TABS tablet, Take 1 tablet (595 mg total) by mouth 2 (two) times daily., Disp: 180 tablet, Rfl: 1 .  Simethicone (GAS RELIEF PO), Take by mouth as needed. , Disp: , Rfl:  .  sodium chloride (OCEAN) 0.65 % nasal spray, Reported on 01/30/2016, Disp: , Rfl:  .  traMADol (ULTRAM) 50 MG tablet, TAKE ONE TABLET BY MOUTH TWO TIMES A DAY AS NEEDED, Disp: 180 tablet, Rfl: 1  Patient Care Team: Mar Daring, PA-C as PCP - General (Family Medicine)    Objective:    Vitals: BP 127/67 (BP Location: Left Arm, Patient Position: Sitting, Cuff Size: Large)   Pulse 65   Temp 98.5 F (36.9 C) (Oral)   Resp 16   Wt 181 lb 3.2 oz (82.2 kg)   BMI 36.60 kg/m   Physical Exam Vitals signs reviewed.  Constitutional:      General: She is not in acute distress.    Appearance: Normal appearance. She is well-developed. She is obese. She is not ill-appearing or diaphoretic.  HENT:     Head: Normocephalic and atraumatic.     Right Ear: Tympanic membrane, ear canal and external ear normal.     Left Ear: Tympanic membrane, ear canal and external ear normal.     Nose: Nose normal.     Mouth/Throat:     Mouth: Mucous membranes are moist.     Pharynx: No oropharyngeal exudate or posterior oropharyngeal erythema.  Eyes:     General: No  scleral icterus.       Right eye: No discharge.        Left eye: No discharge.     Extraocular  Movements: Extraocular movements intact.     Conjunctiva/sclera: Conjunctivae normal.     Pupils: Pupils are equal, round, and reactive to light.  Neck:     Musculoskeletal: Normal range of motion and neck supple.     Thyroid: No thyromegaly.     Vascular: No carotid bruit or JVD.     Trachea: No tracheal deviation.  Cardiovascular:     Rate and Rhythm: Normal rate and regular rhythm.     Pulses: Normal pulses.     Heart sounds: Normal heart sounds. No murmur. No friction rub. No gallop.   Pulmonary:     Effort: Pulmonary effort is normal. No respiratory distress.     Breath sounds: Normal breath sounds. No wheezing or rales.  Chest:     Chest wall: No tenderness.  Abdominal:     General: Bowel sounds are normal. There is no distension.     Palpations: Abdomen is soft. There is no mass.     Tenderness: There is no abdominal tenderness. There is no guarding or rebound.  Musculoskeletal: Normal range of motion.        General: No tenderness.  Lymphadenopathy:     Cervical: No cervical adenopathy.  Skin:    General: Skin is warm and dry.     Capillary Refill: Capillary refill takes less than 2 seconds.     Findings: No rash.  Neurological:     General: No focal deficit present.     Mental Status: She is alert and oriented to person, place, and time. Mental status is at baseline.     Cranial Nerves: No cranial nerve deficit.     Motor: No weakness.     Gait: Gait normal.  Psychiatric:        Mood and Affect: Mood normal.        Behavior: Behavior normal.        Thought Content: Thought content normal.        Judgment: Judgment normal.     Activities of Daily Living No flowsheet data found.  Fall Risk Assessment Fall Risk  05/04/2019 10/05/2018 04/02/2017 03/31/2016 11/29/2015  Falls in the past year? 0 1 Yes Yes Yes  Comment - Emmi Telephone Survey: data to providers prior to load - - -  Number falls in past yr: - 1 2 or more 2 or more 1  Comment - Emmi Telephone Survey  Actual Response = 1 - - -  Injury with Fall? - 0 No No No     Depression Screen PHQ 2/9 Scores 12/27/2018 07/28/2017 04/02/2017 03/31/2016  PHQ - 2 Score - 2 0 1  PHQ- 9 Score - 8 3 -  Exception Documentation Medical reason - - -  Not completed - - - -    No flowsheet data found.    Assessment & Plan:     Annual Wellness Visit  Reviewed patient's Family Medical History Reviewed and updated list of patient's medical providers Assessment of cognitive impairment was done Assessed patient's functional ability Established a written schedule for health screening [place] Completed and Reviewed  Exercise Activities and Dietary recommendations Goals   None     Immunization History  Administered Date(s) Administered  . Influenza, High Dose Seasonal PF 08/29/2015, 09/01/2016, 07/28/2017, 08/16/2018  . Pneumococcal Conjugate-13 08/29/2015  . Pneumococcal Polysaccharide-23  07/28/2017    Health Maintenance  Topic Date Due  . TETANUS/TDAP  12/28/2019 (Originally 07/09/1952)  . INFLUENZA VACCINE  06/18/2019  . DEXA SCAN  Completed  . PNA vac Low Risk Adult  Completed     Discussed health benefits of physical activity, and encouraged her to engage in regular exercise appropriate for her age and condition.    1. Medicare annual wellness visit, subsequent Normal exam. Up to date on screenings and immunizations. I will see her back in 3 months.   2. Arthritis Stable. Diagnosis pulled for medication refill. Continue current medical treatment plan. - traMADol (ULTRAM) 50 MG tablet; TAKE ONE TABLET BY MOUTH TWO TIMES A DAY AS NEEDED  Dispense: 180 tablet; Refill: 1  3. Bilateral lower extremity edema Stable. Diagnosis pulled for medication refill. Continue current medical treatment plan. - furosemide (LASIX) 20 MG tablet; Take 1 tablet (20 mg total) by mouth daily. May take extra tab if weight up greater 3 pounds  Dispense: 180 tablet; Refill: 1  4. GAD  (generalized anxiety disorder) Stable. Diagnosis pulled for medication refill. Continue current medical treatment plan. - ALPRAZolam (XANAX) 0.25 MG tablet; TAKE ONE TABLET BY MOUTH TWO TIMES A DAY AS NEEDED FOR ANXIETY  Dispense: 180 tablet; Refill: 1 d ------------------------------------------------------------------------------------------------------------    Mar Daring, PA-C  [facility]

## 2019-05-04 ENCOUNTER — Ambulatory Visit (INDEPENDENT_AMBULATORY_CARE_PROVIDER_SITE_OTHER): Payer: Medicare Other | Admitting: Physician Assistant

## 2019-05-04 ENCOUNTER — Other Ambulatory Visit: Payer: Self-pay

## 2019-05-04 ENCOUNTER — Encounter: Payer: Self-pay | Admitting: Physician Assistant

## 2019-05-04 VITALS — BP 127/67 | HR 65 | Temp 98.5°F | Resp 16 | Wt 181.2 lb

## 2019-05-04 DIAGNOSIS — F411 Generalized anxiety disorder: Secondary | ICD-10-CM

## 2019-05-04 DIAGNOSIS — R600 Localized edema: Secondary | ICD-10-CM | POA: Diagnosis not present

## 2019-05-04 DIAGNOSIS — M1990 Unspecified osteoarthritis, unspecified site: Secondary | ICD-10-CM | POA: Diagnosis not present

## 2019-05-04 DIAGNOSIS — Z0000 Encounter for general adult medical examination without abnormal findings: Secondary | ICD-10-CM | POA: Diagnosis not present

## 2019-05-04 MED ORDER — ALPRAZOLAM 0.25 MG PO TABS: ORAL_TABLET | ORAL | 1 refills | Status: DC

## 2019-05-04 MED ORDER — TRAMADOL HCL 50 MG PO TABS: ORAL_TABLET | ORAL | 1 refills | Status: DC

## 2019-05-04 MED ORDER — FUROSEMIDE 20 MG PO TABS: 20.0000 mg | ORAL_TABLET | Freq: Every day | ORAL | 1 refills | Status: DC

## 2019-05-04 NOTE — Patient Instructions (Signed)
Health Maintenance After Age 83 After age 83, you are at a higher risk for certain long-term diseases and infections as well as injuries from falls. Falls are a major cause of broken bones and head injuries in people who are older than age 83. Getting regular preventive care can help to keep you healthy and well. Preventive care includes getting regular testing and making lifestyle changes as recommended by your health care provider. Talk with your health care provider about:  Which screenings and tests you should have. A screening is a test that checks for a disease when you have no symptoms.  A diet and exercise plan that is right for you. What should I know about screenings and tests to prevent falls? Screening and testing are the best ways to find a health problem early. Early diagnosis and treatment give you the best chance of managing medical conditions that are common after age 83. Certain conditions and lifestyle choices may make you more likely to have a fall. Your health care provider may recommend:  Regular vision checks. Poor vision and conditions such as cataracts can make you more likely to have a fall. If you wear glasses, make sure to get your prescription updated if your vision changes.  Medicine review. Work with your health care provider to regularly review all of the medicines you are taking, including over-the-counter medicines. Ask your health care provider about any side effects that may make you more likely to have a fall. Tell your health care provider if any medicines that you take make you feel dizzy or sleepy.  Osteoporosis screening. Osteoporosis is a condition that causes the bones to get weaker. This can make the bones weak and cause them to break more easily.  Blood pressure screening. Blood pressure changes and medicines to control blood pressure can make you feel dizzy.  Strength and balance checks. Your health care provider may recommend certain tests to check your  strength and balance while standing, walking, or changing positions.  Foot health exam. Foot pain and numbness, as well as not wearing proper footwear, can make you more likely to have a fall.  Depression screening. You may be more likely to have a fall if you have a fear of falling, feel emotionally low, or feel unable to do activities that you used to do.  Alcohol use screening. Using too much alcohol can affect your balance and may make you more likely to have a fall. What actions can I take to lower my risk of falls? General instructions  Talk with your health care provider about your risks for falling. Tell your health care provider if: ? You fall. Be sure to tell your health care provider about all falls, even ones that seem minor. ? You feel dizzy, sleepy, or off-balance.  Take over-the-counter and prescription medicines only as told by your health care provider. These include any supplements.  Eat a healthy diet and maintain a healthy weight. A healthy diet includes low-fat dairy products, low-fat (lean) meats, and fiber from whole grains, beans, and lots of fruits and vegetables. Home safety  Remove any tripping hazards, such as rugs, cords, and clutter.  Install safety equipment such as grab bars in bathrooms and safety rails on stairs.  Keep rooms and walkways well-lit. Activity   Follow a regular exercise program to stay fit. This will help you maintain your balance. Ask your health care provider what types of exercise are appropriate for you.  If you need a cane or   walker, use it as recommended by your health care provider.  Wear supportive shoes that have nonskid soles. Lifestyle  Do not drink alcohol if your health care provider tells you not to drink.  If you drink alcohol, limit how much you have: ? 0-1 drink a day for women. ? 0-2 drinks a day for men.  Be aware of how much alcohol is in your drink. In the [place] one drink equals one typical bottle of beer (12  oz), one-half glass of wine (5 oz), or one shot of hard liquor (1 oz).  Do not use any products that contain nicotine or tobacco, such as cigarettes and e-cigarettes. If you need help quitting, ask your health care provider. Summary  Having a healthy lifestyle and getting preventive care can help to protect your health and wellness after age 83.  Screening and testing are the best way to find a health problem early and help you avoid having a fall. Early diagnosis and treatment give you the best chance for managing medical conditions that are more common for people who are older than age 83.  Falls are a major cause of broken bones and head injuries in people who are older than age 83. Take precautions to prevent a fall at home.  Work with your health care provider to learn what changes you can make to improve your health and wellness and to prevent falls. This information is not intended to replace advice given to you by your health care provider. Make sure you discuss any questions you have with your health care provider. Document Released: 09/16/2017 Document Revised: 09/16/2017 Document Reviewed: 09/16/2017 Elsevier Interactive Patient Education  2019 [employer]  

## 2019-05-07 ENCOUNTER — Other Ambulatory Visit: Payer: Self-pay | Admitting: Physician Assistant

## 2019-05-07 DIAGNOSIS — F411 Generalized anxiety disorder: Secondary | ICD-10-CM

## 2019-05-31 DIAGNOSIS — M542 Cervicalgia: Secondary | ICD-10-CM | POA: Diagnosis not present

## 2019-05-31 DIAGNOSIS — M5481 Occipital neuralgia: Secondary | ICD-10-CM | POA: Diagnosis not present

## 2019-06-01 DIAGNOSIS — M7918 Myalgia, other site: Secondary | ICD-10-CM | POA: Insufficient documentation

## 2019-06-01 DIAGNOSIS — M5481 Occipital neuralgia: Secondary | ICD-10-CM | POA: Insufficient documentation

## 2019-06-14 ENCOUNTER — Other Ambulatory Visit: Payer: Self-pay | Admitting: Physician Assistant

## 2019-06-14 DIAGNOSIS — K219 Gastro-esophageal reflux disease without esophagitis: Secondary | ICD-10-CM

## 2019-07-21 ENCOUNTER — Other Ambulatory Visit: Payer: Self-pay | Admitting: Physician Assistant

## 2019-07-21 DIAGNOSIS — K219 Gastro-esophageal reflux disease without esophagitis: Secondary | ICD-10-CM

## 2019-07-21 DIAGNOSIS — E782 Mixed hyperlipidemia: Secondary | ICD-10-CM

## 2019-08-04 NOTE — Progress Notes (Signed)
Patient: Helen Ramsey Female    DOB: [birth year]   85 y.o.   MRN: [id number] Visit Date: 08/05/2019  Today's Provider: Mar Daring, PA-C   Chief Complaint  Patient presents with  . Follow-up  . Hypertension  . Hyperlipidemia  . Gastroesophageal Reflux  . Anxiety   Subjective:    Patient had AWV with [name] 05/04/2019.  HPI    Hypertension, follow-up:  BP Readings from Last 3 Encounters:  08/05/19 (!) 124/54  05/04/19 127/67  01/19/19 (!) 153/75    She was last seen for hypertension 6 months ago.  BP at that visit was 153/75. Management since that visit includes; no changes.She reports good compliance with treatment. She is not having side effects. none She some exercising. She is adherent to low salt diet.   Outside blood pressures are 135/63. She is experiencing none.  Patient denies chest pain, chest pressure/discomfort, claudication, dyspnea, exertional chest pressure/discomfort, fatigue, irregular heart beat, lower extremity edema, near-syncope, orthopnea, palpitations, paroxysmal nocturnal dyspnea, syncope and tachypnea.   Cardiovascular risk factors include advanced age (older than 48 for men, 69 for women) and hypertension.  Use of agents associated with hypertension: none.   -----------------------------------------------------------   Lipid/Cholesterol, Follow-up:   Last seen for this 6 months ago.  Management since that visit includes; no changes.  Last Lipid Panel:    Component Value Date/Time   CHOL 164 12/27/2018 1037   TRIG 107 12/27/2018 1037   HDL 54 12/27/2018 1037   CHOLHDL 3.0 12/27/2018 1037   LDLCALC 89 12/27/2018 1037    She reports good compliance with treatment. She is not having side effects. none  Wt Readings from Last 3 Encounters:  08/05/19 182 lb (82.6 kg)  05/04/19 181 lb 3.2 oz (82.2 kg)  01/19/19 182 lb (82.6 kg)    -----------------------------------------------------------  Constipation: Worsening  per patient. Has tried Miralax and MOM without complete resolution of BM. Has had to perform self disimpaction a few times over the last couple weeks.  Also takes 3 stool softeners per day and eats prunes.    Allergies  Allergen Reactions  . Amoxicillin   . Clindamycin/Lincomycin   . Doxycycline   . Levaquin [Levofloxacin In D5w]   . Mirabegron Nausea And Vomiting  . Nsaids Other (See Comments)    GI upset  . Oxycodone Nausea Only    Other reaction(s): Hallucination  . Phenergan [Promethazine Hcl]   . Singulair [Montelukast]   . Vicodin [Hydrocodone-Acetaminophen]   . Zocor [Simvastatin]   . Zoloft  [Sertraline] Other (See Comments)  . Ampicillin Rash  . Bactrim [Sulfamethoxazole-Trimethoprim] Rash  . Penicillins Rash    Ampicillin, Clindamycin, Doxycycline Levaquin-severe headache, muscle and joint aches, weak  . Tolmetin     Other reaction(s): Other (See Comments) GI upset     Current Outpatient Medications:  .  acetaminophen (TYLENOL) 500 MG tablet, Take 500 mg by mouth daily. , Disp: , Rfl:  .  ALPRAZolam (XANAX) 0.25 MG tablet, TAKE ONE TABLET BY MOUTH TWO TIMES A DAY AS NEEDED FOR ANXIETY, Disp: 180 tablet, Rfl: 1 .  aspirin EC 81 MG tablet, Take 81 mg by mouth daily., Disp: , Rfl:  .  atorvastatin (LIPITOR) 20 MG tablet, TAKE 1 TABLET BY MOUTH EVERY DAY AT 6:00 PM, Disp: 90 tablet, Rfl: 1 .  azelastine (ASTELIN) 0.1 % nasal spray, Place 1 spray into both nostrils 2 (two) times daily. Use in each nostril as directed, Disp: 30 mL, Rfl:  5 .  Calcium Carbonate-Vitamin D 600-400 MG-UNIT tablet, Take 1 tablet by mouth 2 (two) times daily., Disp: 180 tablet, Rfl: 1 .  cetirizine (ZYRTEC) 10 MG tablet, Take 10 mg daily by mouth., Disp: , Rfl:  .  citalopram (CELEXA) 40 MG tablet, TAKE ONE(1) TABLET BY MOUTH EVERY DAY, Disp: 90 tablet, Rfl: 1 .  cyanocobalamin 1000 MCG tablet, Take 1,000 mcg by mouth daily., Disp: , Rfl:  .  Dentifrices (FLUORIDE TOOTHPASTE DT), Place onto  teeth., Disp: , Rfl:  .  docusate sodium (COLACE) 100 MG capsule, Take 300 mg by mouth 2 (two) times daily as needed for mild constipation. , Disp: , Rfl:  .  EMGALITY 120 MG/ML SOAJ, , Disp: , Rfl: 0 .  famotidine (PEPCID) 20 MG tablet, TAKE 1 TABLET BY MOUTH DAILY AS NEEDED FOR HEARTBURN OR INDIGESTION, Disp: 90 tablet, Rfl: 1 .  furosemide (LASIX) 20 MG tablet, Take 1 tablet (20 mg total) by mouth daily. May take extra tab if weight up greater 3 pounds, Disp: 180 tablet, Rfl: 1 .  gabapentin (NEURONTIN) 100 MG capsule, Start Gabapentin 100 mg (one tablet) in the morning, 100 mg in the afternoon (one tablet), and 200 mg (two tablets) in the evening., Disp: 360 capsule, Rfl: 1 .  lisinopril (PRINIVIL,ZESTRIL) 5 MG tablet, TAKE 1 TABLET BY MOUTH DAILY, Disp: 90 tablet, Rfl: 1 .  Magnesium Hydroxide (MILK OF MAGNESIA PO), Take by mouth as needed., Disp: , Rfl:  .  Magnesium Oxide, Antacid, 500 MG CAPS, Take 1 tablet by mouth as needed., Disp: , Rfl:  .  Melatonin 1 MG TABS, Take 1 tablet by mouth as needed. , Disp: , Rfl:  .  Omega-3 Fatty Acids (FISH OIL) 1200 MG CAPS, Take 2 capsules by mouth daily. , Disp: , Rfl:  .  omeprazole (PRILOSEC) 20 MG capsule, TAKE 1 CAPSULE(20 MG) BY MOUTH DAILY, Disp: 90 capsule, Rfl: 1 .  phenazopyridine (AZO-TABS) 95 MG tablet, Take 1 tablet (95 mg total) by mouth as needed., Disp: 30 tablet, Rfl: 3 .  potassium gluconate 595 (99 K) MG TABS tablet, Take 1 tablet (595 mg total) by mouth 2 (two) times daily., Disp: 180 tablet, Rfl: 1 .  Simethicone (GAS RELIEF PO), Take by mouth as needed. , Disp: , Rfl:  .  sodium chloride (OCEAN) 0.65 % nasal spray, Reported on 01/30/2016, Disp: , Rfl:  .  traMADol (ULTRAM) 50 MG tablet, TAKE ONE TABLET BY MOUTH TWO TIMES A DAY AS NEEDED, Disp: 180 tablet, Rfl: 1 .  polyethylene glycol powder (GLYCOLAX/MIRALAX) powder, Take 17 g by mouth 2 (two) times daily as needed. (Patient not taking: Reported on 08/05/2019), Disp: 527 g, Rfl: 1   Review of Systems  Constitutional: Negative for appetite change, chills, fatigue and fever.  Respiratory: Negative for chest tightness and shortness of breath.   Cardiovascular: Negative for chest pain and palpitations.  Gastrointestinal: Positive for constipation. Negative for abdominal pain, nausea and vomiting.  Genitourinary: Positive for enuresis.  Neurological: Negative for dizziness and weakness.    Social History   Tobacco Use  . Smoking status: Never Smoker  . Smokeless tobacco: Never Used  Substance Use Topics  . Alcohol use: No      Objective:   BP (!) 124/54 (BP Location: Left Arm, Patient Position: Sitting, Cuff Size: Large)   Pulse 65   Temp (!) 96.8 F (36 C) (Other (Comment))   Resp 16   Ht 4\' 11"  (1.499 m)   Wt  182 lb (82.6 kg)   SpO2 93%   BMI 36.76 kg/m  Vitals:   08/05/19 0955  BP: (!) 124/54  Pulse: 65  Resp: 16  Temp: (!) 96.8 F (36 C)  TempSrc: Other (Comment)  SpO2: 93%  Weight: 182 lb (82.6 kg)  Height: 4\' 11"  (1.499 m)  Body mass index is 36.76 kg/m.   Physical Exam Vitals signs reviewed.  Constitutional:      General: She is not in acute distress.    Appearance: Normal appearance. She is well-developed. She is obese. She is not ill-appearing or diaphoretic.  Neck:     Musculoskeletal: Normal range of motion and neck supple.     Thyroid: No thyromegaly.     Vascular: No JVD.     Trachea: No tracheal deviation.  Cardiovascular:     Rate and Rhythm: Normal rate and regular rhythm.     Pulses: Normal pulses.     Heart sounds: Normal heart sounds. No murmur. No friction rub. No gallop.   Pulmonary:     Effort: Pulmonary effort is normal. No respiratory distress.     Breath sounds: Normal breath sounds. No wheezing or rales.  Abdominal:     General: Bowel sounds are normal.     Palpations: Abdomen is soft.     Tenderness: There is no abdominal tenderness.  Musculoskeletal:     Right lower leg: Edema (trace) present.     Left  lower leg: Edema (trace) present.  Lymphadenopathy:     Cervical: No cervical adenopathy.  Skin:    General: Skin is warm and dry.  Neurological:     Mental Status: She is alert.      No results found for any visits on 08/05/19.     Assessment & Plan    1. Chronic idiopathic constipation Worsening despite increasing water intake and using laxatives OTC. Will try Amitiza as below. Keep increasing fluids. Call if worsening. I will see her back in 3 months.  - lubiprostone (AMITIZA) 24 MCG capsule; Take 1 capsule (24 mcg total) by mouth 2 (two) times daily with a meal.  Dispense: 180 capsule; Refill: 1  2. Itching Suspect due to antihistamines and aging skin. Advised to not wash in hot water, use warm water. Exfoliate often and use thick, moisturizing lotion twice daily.   3. Urge incontinence Discussed timed toileting and going to the restroom every two hours instead of waiting until she feels she has to go to the restroom. She will try.   4. Familial multiple lipoprotein-type hyperlipidemia Stable. Continue Atorvastatin.   5. Essential (primary) hypertension Stable. COntinue lisinopril 5mg , furosemide 20mg .  6. Need for influenza vaccination Flu vaccine given today without complication. Patient sat upright for 15 minutes to check for adverse reaction before being released. - Flu Vaccine QUAD High Dose(Fluad)     Mar Daring, PA-C  [facility]

## 2019-08-05 ENCOUNTER — Encounter: Payer: Self-pay | Admitting: Physician Assistant

## 2019-08-05 ENCOUNTER — Other Ambulatory Visit: Payer: Self-pay

## 2019-08-05 ENCOUNTER — Ambulatory Visit (INDEPENDENT_AMBULATORY_CARE_PROVIDER_SITE_OTHER): Payer: Medicare Other | Admitting: Physician Assistant

## 2019-08-05 VITALS — BP 124/54 | HR 65 | Temp 96.8°F | Resp 16 | Ht 59.0 in | Wt 182.0 lb

## 2019-08-05 DIAGNOSIS — L299 Pruritus, unspecified: Secondary | ICD-10-CM

## 2019-08-05 DIAGNOSIS — K5904 Chronic idiopathic constipation: Secondary | ICD-10-CM | POA: Diagnosis not present

## 2019-08-05 DIAGNOSIS — Z23 Encounter for immunization: Secondary | ICD-10-CM | POA: Diagnosis not present

## 2019-08-05 DIAGNOSIS — I10 Essential (primary) hypertension: Secondary | ICD-10-CM | POA: Diagnosis not present

## 2019-08-05 DIAGNOSIS — N3941 Urge incontinence: Secondary | ICD-10-CM

## 2019-08-05 DIAGNOSIS — E7849 Other hyperlipidemia: Secondary | ICD-10-CM | POA: Diagnosis not present

## 2019-08-05 MED ORDER — LUBIPROSTONE 24 MCG PO CAPS: 24.0000 ug | ORAL_CAPSULE | Freq: Two times a day (BID) | ORAL | 1 refills | Status: DC

## 2019-08-05 NOTE — Patient Instructions (Signed)
Lubiprostone oral capsule What is this medicine? LUBIPROSTONE (loo bi PROS tone) is a laxative. It is used to treat chronic constipation and constipation caused by opioids (certain prescription pain medicines). It is also used to treat adult women with irritable bowel syndrome who have constipation. This medicine may be used for other purposes; ask your health care provider or pharmacist if you have questions. COMMON BRAND NAME(S): Amitiza What should I tell my health care provider before I take this medicine? They need to know if you have any of these conditions:  cancer or tumor in abdomen, intestine, or stomach  history of bowel obstruction or adhesions  history of stool (fecal) impaction  liver disease  an unusual or allergic reaction to lubiprostone, other medicines, foods, dyes, or preservatives  pregnant or trying to get pregnant  breast-feeding How should I use this medicine? Take this medicine by mouth with a glass of water. Follow the directions on the prescription label. Do not cut, crush or chew this medicine. Take this medicine with food. Take your medicine at regular intervals. Do not take your medicine more often than directed. Do not stop taking except on your doctor's advice. Talk to your pediatrician regarding the use of this medicine in children. Special care may be needed. Overdosage: If you think you have taken too much of this medicine contact a poison control center or emergency room at once. NOTE: This medicine is only for you. Do not share this medicine with others. What if I miss a dose? If you miss a dose, take it as soon as you can. If it is almost time for your next dose, take only that dose. Do not take double or extra doses. What may interact with this medicine?  medicines that treat diarrhea  methadone  other medicines for constipation This list may not describe all possible interactions. Give your health care provider a list of all the medicines,  herbs, non-prescription drugs, or dietary supplements you use. Also tell them if you smoke, drink alcohol, or use illegal drugs. Some items may interact with your medicine. What should I watch for while using this medicine? Visit your doctor for regular check ups. Tell your doctor if your symptoms do not get better or if they get worse. What side effects may I notice from receiving this medicine? Side effects that you should report to your doctor or health care professional as soon as possible:  allergic reactions like skin rash, itching or hives, swelling of the face, lips, or tongue  feeling faint or lightheaded, falls  new or worsening stomach pain  severe or prolonged diarrhea  vomiting Side effects that usually do not require medical attention (report to your doctor or health care professional if they continue or are bothersome):  headache  loose stools  nausea This list may not describe all possible side effects. Call your doctor for medical advice about side effects. You may report side effects to FDA at 1-800-FDA-1088. Where should I keep my medicine? Keep out of the reach of children. Store at room temperature between 15 and 30 degrees C (59 and 86 degrees F). Throw away any unused medicine after the expiration date. NOTE: This sheet is a summary. It may not cover all possible information. If you have questions about this medicine, talk to your doctor, pharmacist, or health care provider.  2020 Elsevier/Gold Standard (2015-12-06 11:04:22)

## 2019-08-30 DIAGNOSIS — Z961 Presence of intraocular lens: Secondary | ICD-10-CM | POA: Diagnosis not present

## 2019-10-19 ENCOUNTER — Other Ambulatory Visit: Payer: Self-pay | Admitting: Physician Assistant

## 2019-10-19 DIAGNOSIS — F411 Generalized anxiety disorder: Secondary | ICD-10-CM

## 2019-10-20 ENCOUNTER — Other Ambulatory Visit: Payer: Self-pay | Admitting: Physician Assistant

## 2019-10-20 DIAGNOSIS — Z1231 Encounter for screening mammogram for malignant neoplasm of breast: Secondary | ICD-10-CM

## 2019-11-07 NOTE — Progress Notes (Signed)
Patient: Helen Ramsey Female    DOB: Jul 27, 1933   85 y.o.   MRN: [id number] Visit Date: 11/08/2019  Today's Provider: Mar Daring, PA-C    Chief Complaint  Patient presents with  . Follow-up    Constipation   Subjective:     HPI  Chronic idiopathic constipation: patient here for 3 month f/u. She was given Amitiza. Reports it was helping in the beginning but now having difficulties again. She reports she is having a BM every 2-3 days. Still using stool softeners and will use MOM as needed.   Sore spot right side of her chest she notice it 3 weeks. She wants to have a mammogram scheduled.   Has a list of questions to be answered.   Allergies  Allergen Reactions  . Amoxicillin   . Clindamycin/Lincomycin   . Doxycycline   . Levaquin [Levofloxacin In D5w]   . Mirabegron Nausea And Vomiting  . Nsaids Other (See Comments)    GI upset  . Oxycodone Nausea Only    Other reaction(s): Hallucination  . Phenergan [Promethazine Hcl]   . Singulair [Montelukast]   . Vicodin [Hydrocodone-Acetaminophen]   . Zocor [Simvastatin]   . Zoloft  [Sertraline] Other (See Comments)  . Ampicillin Rash  . Bactrim [Sulfamethoxazole-Trimethoprim] Rash  . Penicillins Rash    Ampicillin, Clindamycin, Doxycycline Levaquin-severe headache, muscle and joint aches, weak  . Tolmetin     Other reaction(s): Other (See Comments) GI upset     Current Outpatient Medications:  .  acetaminophen (TYLENOL) 500 MG tablet, Take 500 mg by mouth daily. , Disp: , Rfl:  .  ALPRAZolam (XANAX) 0.25 MG tablet, TAKE ONE TABLET BY MOUTH TWO TIMES A DAY AS NEEDED FOR ANXIETY, Disp: 180 tablet, Rfl: 1 .  aspirin EC 81 MG tablet, Take 81 mg by mouth daily., Disp: , Rfl:  .  atorvastatin (LIPITOR) 20 MG tablet, TAKE 1 TABLET BY MOUTH EVERY DAY AT 6:00 PM, Disp: 90 tablet, Rfl: 1 .  azelastine (ASTELIN) 0.1 % nasal spray, Place 1 spray into both nostrils 2 (two) times daily. Use in each nostril as  directed, Disp: 30 mL, Rfl: 5 .  azithromycin (ZITHROMAX) 250 MG tablet, Take 250 mg by mouth daily. 2 prior any procedure, Disp: , Rfl:  .  Calcium Carbonate-Vitamin D 600-400 MG-UNIT tablet, Take 1 tablet by mouth 2 (two) times daily., Disp: 180 tablet, Rfl: 1 .  cetirizine (ZYRTEC) 10 MG tablet, Take 10 mg daily by mouth., Disp: , Rfl:  .  citalopram (CELEXA) 40 MG tablet, TAKE 1 TABLET BY MOUTH EVERY DAY, Disp: 90 tablet, Rfl: 1 .  cyanocobalamin 1000 MCG tablet, Take 1,000 mcg by mouth daily., Disp: , Rfl:  .  Dentifrices (FLUORIDE TOOTHPASTE DT), Place onto teeth., Disp: , Rfl:  .  diphenhydrAMINE (SOMINEX) 25 MG tablet, Take 25 mg by mouth at bedtime as needed for sleep., Disp: , Rfl:  .  docusate sodium (COLACE) 100 MG capsule, Take 300 mg by mouth 2 (two) times daily as needed for mild constipation. , Disp: , Rfl:  .  EMGALITY 120 MG/ML SOAJ, , Disp: , Rfl: 0 .  famotidine (PEPCID) 20 MG tablet, TAKE 1 TABLET BY MOUTH DAILY AS NEEDED FOR HEARTBURN OR INDIGESTION, Disp: 90 tablet, Rfl: 1 .  furosemide (LASIX) 20 MG tablet, Take 1 tablet (20 mg total) by mouth daily. May take extra tab if weight up greater 3 pounds, Disp: 180 tablet, Rfl: 1 .  gabapentin (NEURONTIN) 100 MG capsule, Start Gabapentin 100 mg (one tablet) in the morning, 100 mg in the afternoon (one tablet), and 200 mg (two tablets) in the evening., Disp: 360 capsule, Rfl: 1 .  lisinopril (PRINIVIL,ZESTRIL) 5 MG tablet, TAKE 1 TABLET BY MOUTH DAILY, Disp: 90 tablet, Rfl: 1 .  lubiprostone (AMITIZA) 24 MCG capsule, Take 1 capsule (24 mcg total) by mouth 2 (two) times daily with a meal., Disp: 180 capsule, Rfl: 1 .  Magnesium Oxide, Antacid, 500 MG CAPS, Take 1 tablet by mouth as needed., Disp: , Rfl:  .  Melatonin 1 MG TABS, Take 1 tablet by mouth as needed. , Disp: , Rfl:  .  mometasone (NASONEX) 50 MCG/ACT nasal spray, 2 sprays daily., Disp: , Rfl:  .  Omega-3 Fatty Acids (FISH OIL) 1200 MG CAPS, Take 2 capsules by mouth daily.  , Disp: , Rfl:  .  omeprazole (PRILOSEC) 20 MG capsule, TAKE 1 CAPSULE(20 MG) BY MOUTH DAILY, Disp: 90 capsule, Rfl: 1 .  phenazopyridine (AZO-TABS) 95 MG tablet, Take 1 tablet (95 mg total) by mouth as needed., Disp: 30 tablet, Rfl: 3 .  potassium gluconate 595 (99 K) MG TABS tablet, Take 1 tablet (595 mg total) by mouth 2 (two) times daily., Disp: 180 tablet, Rfl: 1 .  Simethicone (GAS RELIEF PO), Take by mouth as needed. , Disp: , Rfl:  .  sodium chloride (OCEAN) 0.65 % nasal spray, Reported on 01/30/2016, Disp: , Rfl:  .  traMADol (ULTRAM) 50 MG tablet, TAKE ONE TABLET BY MOUTH TWO TIMES A DAY AS NEEDED, Disp: 180 tablet, Rfl: 1 .  Magnesium Hydroxide (MILK OF MAGNESIA PO), Take by mouth as needed., Disp: , Rfl:   Review of Systems  Constitutional: Negative.   Respiratory: Negative.   Cardiovascular: Positive for leg swelling (improves with compression stocking). Negative for chest pain and palpitations.  Gastrointestinal: Positive for abdominal distention and constipation.  Neurological: Negative.     Social History   Tobacco Use  . Smoking status: Never Smoker  . Smokeless tobacco: Never Used  Substance Use Topics  . Alcohol use: No      Objective:   BP 119/66 (BP Location: Left Arm, Patient Position: Sitting, Cuff Size: Large)   Pulse 68   Temp (!) 97.1 F (36.2 C) (Temporal)   Resp 16   Wt 173 lb 9.6 oz (78.7 kg)   BMI 35.06 kg/m  Vitals:   11/08/19 1334  BP: 119/66  Pulse: 68  Resp: 16  Temp: (!) 97.1 F (36.2 C)  TempSrc: Temporal  Weight: 173 lb 9.6 oz (78.7 kg)  Body mass index is 35.06 kg/m.   Physical Exam Vitals reviewed.  Constitutional:      General: She is not in acute distress.    Appearance: Normal appearance. She is well-developed. She is obese. She is not ill-appearing or diaphoretic.  Neck:     Thyroid: No thyromegaly.     Vascular: No JVD.     Trachea: No tracheal deviation.  Cardiovascular:     Rate and Rhythm: Normal rate and regular  rhythm.     Pulses: Normal pulses.     Heart sounds: Normal heart sounds. No murmur. No friction rub. No gallop.   Pulmonary:     Effort: Pulmonary effort is normal. No respiratory distress.     Breath sounds: Normal breath sounds. No wheezing or rales.  Musculoskeletal:     Cervical back: Normal range of motion and neck supple.  Right lower leg: No edema.     Left lower leg: No edema.  Lymphadenopathy:     Cervical: No cervical adenopathy.  Neurological:     Mental Status: She is alert.      No results found for any visits on 11/08/19.     Assessment & Plan    1. Chronic idiopathic constipation Continue Amitiza 13mcg BID. May use MOM and stool softeners as needed. Increase fluid and dietary fibers.  - lubiprostone (AMITIZA) 24 MCG capsule; Take 1 capsule (24 mcg total) by mouth 2 (two) times daily with a meal.  Dispense: 180 capsule; Refill: 1  2. Breast tenderness in female Noted in right breast around 9 o'clock. Ordered diagnostic and [place] as below.  - MM DIAG BREAST TOMO BILATERAL; Future - US BREAST LTD UNI RIGHT INC AXILLA; Future  3. Osteopenia of multiple sites Due for BMD.  - DG Bone Density; Future  4. Essential (primary) hypertension Stable. Diagnosis pulled for medication refill. Continue current medical treatment plan. - lisinopril (ZESTRIL) 5 MG tablet; TAKE 1 TABLET BY MOUTH DAILY  Dispense: 90 tablet; Refill: 1  5. Mixed hyperlipidemia Stable. Diagnosis pulled for medication refill. Continue current medical treatment plan. - atorvastatin (LIPITOR) 20 MG tablet; TAKE 1 TABLET BY MOUTH EVERY DAY AT 6:00 PM  Dispense: 90 tablet; Refill: 1  6. Arthritis Stable. Diagnosis pulled for medication refill. Continue current medical treatment plan. - traMADol (ULTRAM) 50 MG tablet; TAKE ONE TABLET BY MOUTH TWO TIMES A DAY AS NEEDED  Dispense: 180 tablet; Refill: 1  7. Bilateral lower extremity edema Stable. Diagnosis pulled for medication refill. Continue current  medical treatment plan. - furosemide (LASIX) 20 MG tablet; Take 1 tablet (20 mg total) by mouth daily. May take extra tab if weight up greater 3 pounds  Dispense: 180 tablet; Refill: 1  8. GAD (generalized anxiety disorder) Stable. Diagnosis pulled for medication refill. Continue current medical treatment plan. - ALPRAZolam (XANAX) 0.25 MG tablet; TAKE ONE TABLET BY MOUTH TWO TIMES A DAY AS NEEDED FOR ANXIETY  Dispense: 180 tablet; Refill: [place], PA-C  [facility]

## 2019-11-08 ENCOUNTER — Encounter: Payer: Self-pay | Admitting: Physician Assistant

## 2019-11-08 ENCOUNTER — Ambulatory Visit (INDEPENDENT_AMBULATORY_CARE_PROVIDER_SITE_OTHER): Payer: Medicare Other | Admitting: Physician Assistant

## 2019-11-08 ENCOUNTER — Other Ambulatory Visit: Payer: Self-pay

## 2019-11-08 VITALS — BP 119/66 | HR 68 | Temp 97.1°F | Resp 16 | Wt 173.6 lb

## 2019-11-08 DIAGNOSIS — M8589 Other specified disorders of bone density and structure, multiple sites: Secondary | ICD-10-CM | POA: Diagnosis not present

## 2019-11-08 DIAGNOSIS — E782 Mixed hyperlipidemia: Secondary | ICD-10-CM

## 2019-11-08 DIAGNOSIS — I10 Essential (primary) hypertension: Secondary | ICD-10-CM

## 2019-11-08 DIAGNOSIS — F411 Generalized anxiety disorder: Secondary | ICD-10-CM

## 2019-11-08 DIAGNOSIS — K5904 Chronic idiopathic constipation: Secondary | ICD-10-CM | POA: Diagnosis not present

## 2019-11-08 DIAGNOSIS — R600 Localized edema: Secondary | ICD-10-CM

## 2019-11-08 DIAGNOSIS — N644 Mastodynia: Secondary | ICD-10-CM | POA: Diagnosis not present

## 2019-11-08 DIAGNOSIS — M1990 Unspecified osteoarthritis, unspecified site: Secondary | ICD-10-CM

## 2019-11-08 MED ORDER — LUBIPROSTONE 24 MCG PO CAPS: 24.0000 ug | ORAL_CAPSULE | Freq: Two times a day (BID) | ORAL | 1 refills | Status: DC

## 2019-11-08 MED ORDER — LISINOPRIL 5 MG PO TABS: ORAL_TABLET | ORAL | 1 refills | Status: DC

## 2019-11-08 MED ORDER — FUROSEMIDE 20 MG PO TABS: 20.0000 mg | ORAL_TABLET | Freq: Every day | ORAL | 1 refills | Status: DC

## 2019-11-08 MED ORDER — ALPRAZOLAM 0.25 MG PO TABS: ORAL_TABLET | ORAL | 1 refills | Status: DC

## 2019-11-08 MED ORDER — TRAMADOL HCL 50 MG PO TABS: ORAL_TABLET | ORAL | 1 refills | Status: DC

## 2019-11-08 MED ORDER — ATORVASTATIN CALCIUM 20 MG PO TABS: ORAL_TABLET | ORAL | 1 refills | Status: DC

## 2019-11-09 ENCOUNTER — Encounter: Payer: Self-pay | Admitting: Physician Assistant

## 2019-11-24 ENCOUNTER — Other Ambulatory Visit: Payer: Medicare Other

## 2019-12-02 ENCOUNTER — Ambulatory Visit: Payer: Self-pay | Admitting: *Deleted

## 2019-12-02 NOTE — Telephone Encounter (Signed)
Pt's daughter in law and son called regarding the pt having an elevated temp every day around lunch time.' Her temp on Thursday was  99.4 . She takes Tylenol and it will bring it down, usually her temp is 984. Today her temp is 99.7 and she took Tylenol and a xanax because she was so anxious. And not feeling herself. She also has a stuffy nose, headache and some nausea. She does not drink a lot of water. Her b/p today is 143/67, she does have swollen ankles but has not increased her weight. Her daughter in law advised her to wear her support hose. No hx of heart failure.  The patient was called on 3 way with her son. She is just concerned about being around her family when she may be positive. Pt reassured that we need to do the test to hopefully to make her feel better. She voiced understanding.  She is advised to get the patient tested for covid-19 and to encourage more fluid intake. Information given for a testing site thru [place] with one of the mobile units tomorrow. Will route to [facility] for review and any other recommendation.  Reason for Disposition . Health Information question, no triage required and triager able to answer question  Answer Assessment - Initial Assessment Questions 1. REASON FOR CALL or QUESTION: "What is your reason for calling today?" or "How can I best help you?" or "What question do you have that I can help answer?"     covid testing  Protocols used: INFORMATION ONLY CALL-A-AH

## 2019-12-02 NOTE — Telephone Encounter (Signed)
Previous 3rd attempt to contact pt;s daughter;unable to complete nurse triage; she sees Fenton Malling, [facility]; Will route to office for final disposition.

## 2019-12-02 NOTE — Telephone Encounter (Signed)
Yes agree with testing. I will also send information through her mychart of OTC supplements that they can get her to lessen any severity of symptoms if she is positive.

## 2019-12-02 NOTE — Telephone Encounter (Signed)
#  rd attempt to reach daughter- left message. Called patient line- no answer.

## 2019-12-02 NOTE — Telephone Encounter (Signed)
Summary: 99 temp headache    Patient's daughter [name] requesting to speak with RN regarding patient. She states patient has been spiking a fever of 99.9 with headache.      Attempted to call daughter- left message to return call.

## 2019-12-03 ENCOUNTER — Other Ambulatory Visit: Payer: Self-pay

## 2019-12-03 DIAGNOSIS — Z20822 Contact with and (suspected) exposure to covid-19: Secondary | ICD-10-CM | POA: Diagnosis not present

## 2019-12-04 LAB — NOVEL CORONAVIRUS, NAA: SARS-CoV-2, NAA: NOT DETECTED

## 2019-12-05 ENCOUNTER — Other Ambulatory Visit: Payer: Medicare Other

## 2019-12-07 ENCOUNTER — Other Ambulatory Visit: Payer: Self-pay | Admitting: Physician Assistant

## 2019-12-07 DIAGNOSIS — J324 Chronic pansinusitis: Secondary | ICD-10-CM

## 2020-01-17 ENCOUNTER — Other Ambulatory Visit: Payer: Self-pay | Admitting: Physician Assistant

## 2020-01-17 DIAGNOSIS — K219 Gastro-esophageal reflux disease without esophagitis: Secondary | ICD-10-CM

## 2020-01-23 ENCOUNTER — Ambulatory Visit
Admission: RE | Admit: 2020-01-23 | Discharge: 2020-01-23 | Disposition: A | Discharge status: Home / Self Care | Payer: Medicare Other | Source: Ambulatory Visit | Attending: Physician Assistant | Admitting: Physician Assistant

## 2020-01-23 ENCOUNTER — Telehealth: Payer: Self-pay

## 2020-01-23 DIAGNOSIS — R928 Other abnormal and inconclusive findings on diagnostic imaging of breast: Secondary | ICD-10-CM | POA: Diagnosis not present

## 2020-01-23 DIAGNOSIS — M8589 Other specified disorders of bone density and structure, multiple sites: Secondary | ICD-10-CM | POA: Insufficient documentation

## 2020-01-23 DIAGNOSIS — N6489 Other specified disorders of breast: Secondary | ICD-10-CM | POA: Diagnosis not present

## 2020-01-23 DIAGNOSIS — N644 Mastodynia: Secondary | ICD-10-CM | POA: Diagnosis not present

## 2020-01-23 DIAGNOSIS — Z780 Asymptomatic menopausal state: Secondary | ICD-10-CM | POA: Diagnosis not present

## 2020-01-23 DIAGNOSIS — M810 Age-related osteoporosis without current pathological fracture: Secondary | ICD-10-CM | POA: Diagnosis not present

## 2020-01-23 NOTE — Telephone Encounter (Signed)
-----   Message from Mar Daring, [place] sent at 01/23/2020 12:59 PM EST ----- Mammogram and [place] are normal. Repeat screening in one year

## 2020-01-23 NOTE — Telephone Encounter (Signed)
Helen Ramsey (on [place]) advised as directed below.

## 2020-01-23 NOTE — Telephone Encounter (Signed)
Kathy(on [facility]) advised as directed below.

## 2020-01-23 NOTE — Telephone Encounter (Signed)
-----   Message from Mar Daring, [place] sent at 01/23/2020 11:19 AM EST ----- Bone density does show a very slight decline. Tscore was -2.4 in 2018 and decreased to -2.7 now. Continue weight bearing exercises like walking or yoga. Continue to take Calcium +Vit D supplements. Can discuss osteoporosis treatments again at next visit if they are desired.

## 2020-02-03 ENCOUNTER — Telehealth: Payer: Self-pay | Admitting: Physician Assistant

## 2020-02-03 DIAGNOSIS — K5904 Chronic idiopathic constipation: Secondary | ICD-10-CM

## 2020-02-03 NOTE — Telephone Encounter (Signed)
Pt would only like a 30 day supply until her appt on 4.12.21/ Pt wants to talk to [place] about possibly coming off of this medication/ please advise

## 2020-02-05 ENCOUNTER — Other Ambulatory Visit: Payer: Self-pay | Admitting: Physician Assistant

## 2020-02-05 NOTE — Telephone Encounter (Signed)
Requested medications are due for refill today?  Uncertain.  Medication listed as historical medication.    Requested medications are on active medication list?   Last Refill:    Future visit scheduled?  Yes   Notes to Clinic:

## 2020-02-06 MED ORDER — LUBIPROSTONE 24 MCG PO CAPS: 24.0000 ug | ORAL_CAPSULE | Freq: Two times a day (BID) | ORAL | 1 refills | Status: DC

## 2020-02-06 NOTE — Telephone Encounter (Signed)
The medicine is lubiprostone (AMITIZA) 24 MCG capsule   Pharmacy: [address] and [facility]

## 2020-02-06 NOTE — Telephone Encounter (Signed)
refilled 

## 2020-02-10 ENCOUNTER — Ambulatory Visit: Payer: Self-pay | Admitting: Physician Assistant

## 2020-02-27 ENCOUNTER — Encounter: Payer: Self-pay | Admitting: Physician Assistant

## 2020-02-27 ENCOUNTER — Other Ambulatory Visit: Payer: Self-pay

## 2020-02-27 ENCOUNTER — Ambulatory Visit (INDEPENDENT_AMBULATORY_CARE_PROVIDER_SITE_OTHER): Payer: Medicare Other | Admitting: Physician Assistant

## 2020-02-27 VITALS — BP 149/74 | HR 60 | Temp 96.6°F | Wt 171.0 lb

## 2020-02-27 DIAGNOSIS — Z6834 Body mass index (BMI) 34.0-34.9, adult: Secondary | ICD-10-CM

## 2020-02-27 DIAGNOSIS — I700 Atherosclerosis of aorta: Secondary | ICD-10-CM | POA: Diagnosis not present

## 2020-02-27 DIAGNOSIS — F3341 Major depressive disorder, recurrent, in partial remission: Secondary | ICD-10-CM | POA: Diagnosis not present

## 2020-02-27 DIAGNOSIS — I10 Essential (primary) hypertension: Secondary | ICD-10-CM

## 2020-02-27 DIAGNOSIS — R110 Nausea: Secondary | ICD-10-CM

## 2020-02-27 DIAGNOSIS — K5904 Chronic idiopathic constipation: Secondary | ICD-10-CM | POA: Diagnosis not present

## 2020-02-27 DIAGNOSIS — J320 Chronic maxillary sinusitis: Secondary | ICD-10-CM | POA: Diagnosis not present

## 2020-02-27 DIAGNOSIS — E7800 Pure hypercholesterolemia, unspecified: Secondary | ICD-10-CM | POA: Diagnosis not present

## 2020-02-27 DIAGNOSIS — E559 Vitamin D deficiency, unspecified: Secondary | ICD-10-CM

## 2020-02-27 DIAGNOSIS — K581 Irritable bowel syndrome with constipation: Secondary | ICD-10-CM | POA: Diagnosis not present

## 2020-02-27 DIAGNOSIS — R140 Abdominal distension (gaseous): Secondary | ICD-10-CM | POA: Diagnosis not present

## 2020-02-27 DIAGNOSIS — E6609 Other obesity due to excess calories: Secondary | ICD-10-CM

## 2020-02-27 DIAGNOSIS — R7303 Prediabetes: Secondary | ICD-10-CM

## 2020-02-27 DIAGNOSIS — M1990 Unspecified osteoarthritis, unspecified site: Secondary | ICD-10-CM | POA: Diagnosis not present

## 2020-02-27 DIAGNOSIS — J302 Other seasonal allergic rhinitis: Secondary | ICD-10-CM | POA: Diagnosis not present

## 2020-02-27 MED ORDER — FLONASE SENSIMIST 27.5 MCG/SPRAY NA SUSP: 2.0000 | Freq: Every day | NASAL | 12 refills | Status: DC

## 2020-02-27 MED ORDER — TRULANCE 3 MG PO TABS: 3.0000 mg | ORAL_TABLET | Freq: Every day | ORAL | 1 refills | Status: DC

## 2020-02-27 MED ORDER — LISINOPRIL 5 MG PO TABS: ORAL_TABLET | ORAL | 1 refills | Status: DC

## 2020-02-27 NOTE — Progress Notes (Signed)
Established patient visit      Patient: Helen Ramsey   DOB: 06/23/1933   86 y.o. Female  MRN: [id number] Visit Date: 02/27/2020  Today's healthcare provider: Mar Daring, PA-C  Subjective:    Chief Complaint  Patient presents with  . Hypertension  . Hyperlipidemia  . Sinusitis   Sinusitis Associated symptoms include congestion, ear pain, headaches, shortness of breath, sinus pressure and a sore throat. Pertinent negatives include no chills, coughing or diaphoresis.   Patient coming in today for 3 months follow-up chronic issues:  Chronic idiopathic constipation Continue Amitiza 44mcg BID. May use MOM and stool softeners as needed. Increase fluid and dietary fibers. Patient reports this is not helping any longer. It had worked initially but has recently become less effective. She will use MOM if she goes more than 2 days without a BM. She has still had some issues with impaction and has had to self disimpact.   Also having issues of nausea after eating and lack of overall appetite. She is weight stable. Also having increased bloating and distension.   Essential (primary) hypertension Stable. No issues or complaints with BP.   Mixed hyperlipidemia Stable. No complaints.   GAD (generalized anxiety disorder) Stable. No complaints.   Bilateral lower extremity edema Stable. Using compression stockings and this is helping.    Patient Active Problem List   Diagnosis Date Noted  . Recurrent major depressive disorder, in partial remission ([place]) 02/27/2020  . Bilateral myofascial pain 06/01/2019  . Bilateral occipital neuralgia 06/01/2019  . Age-related osteoporosis without current pathological fracture 08/16/2018  . Gait instability 08/16/2018  . Blurred vision, right eye 08/16/2018  . Ocular pain, right eye 08/16/2018  . Dorsalgia 03/31/2016  . H/O total knee replacement 02/14/2016  . Systolic ejection murmur 08/65/7846  . Vitamin D deficiency 11/29/2015  .  Allergy to other foods 11/29/2015  . Headache 10/28/2015  . Leg cramps 10/24/2015  . Cramp and spasm 10/24/2015  . Knee pain, bilateral 08/29/2015  . Sleep apnea 05/30/2015  . Restless legs syndrome 05/09/2015  . Allergic rhinitis 03/28/2015  . Anemia, unspecified 03/28/2015  . Anxiety and depression 03/28/2015  . Neck pain 03/28/2015  . Chronic headache 03/28/2015  . Depression 03/28/2015  . DDD (degenerative disc disease), lumbar 03/28/2015  . Gastro-esophageal reflux disease without esophagitis 03/28/2015  . 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 Essential (primary) hypertension 03/28/2015  . Adaptive colitis 03/28/2015  . OP (osteoporosis) 03/28/2015  . Nasal septal perforation 03/28/2015  . Spinal stenosis 03/28/2015  . Gastric ulcer without hemorrhage or perforation 03/28/2015  . Cerebral artery occlusion with cerebral infarction ([place]) 03/28/2015  . Chronic sinusitis 03/28/2015  . Connective tissue and disc stenosis of intervertebral foramina of abdomen and other regions 03/28/2015  . Diverticulosis of large intestine without perforation or abscess without bleeding 03/28/2015  . Osteoarthritis 03/28/2015  . Other specified functional intestinal disorders 03/28/2015  . Hardening of the aorta (main artery of the heart) ([place]) 07/13/2014  . H/O adenomatous polyp of colon 06/27/2005   Past Medical History:  Diagnosis Date  . Allergic rhinitis, cause unspecified   . Allergic rhinitis, cause unspecified   . Atherosclerosis of aorta ([place])   . Back pain    lumbar  . Degeneration of lumbar or lumbosacral intervertebral disc   . Diverticulitis of colon (without mention of hemorrhage)(562.11)   . Dizziness and giddiness   . Dysthymic disorder   . Headache(784.0)   . Myalgia and myositis, unspecified   .  Osteoporosis, unspecified   . Other and unspecified hyperlipidemia   . Other diseases of nasal cavity and sinuses(478.19)   . Other specified  disorders of bladder   . Syncope   . Trigger finger (acquired)   . Unspecified essential hypertension   . Unspecified sleep apnea   . Urinary frequency    Allergies  Allergen Reactions  . Amoxicillin   . Clindamycin/Lincomycin   . Doxycycline   . Levaquin [Levofloxacin In D5w]   . Mirabegron Nausea And Vomiting  . Nsaids Other (See Comments)    GI upset  . Oxycodone Nausea Only    Other reaction(s): Hallucination  . Phenergan [Promethazine Hcl]   . Singulair [Montelukast]   . Vicodin [Hydrocodone-Acetaminophen]   . Zocor [Simvastatin]   . Zoloft  [Sertraline] Other (See Comments)  . Ampicillin Rash  . Bactrim [Sulfamethoxazole-Trimethoprim] Rash  . Penicillins Rash    Ampicillin, Clindamycin, Doxycycline Levaquin-severe headache, muscle and joint aches, weak  . Tolmetin     Other reaction(s): Other (See Comments) GI upset       Medications: Outpatient Medications Prior to Visit  Medication Sig  . acetaminophen (TYLENOL) 500 MG tablet Take 500 mg by mouth daily.   Marland Kitchen ALPRAZolam (XANAX) 0.25 MG tablet TAKE ONE TABLET BY MOUTH TWO TIMES A DAY AS NEEDED FOR ANXIETY  . aspirin EC 81 MG tablet Take 81 mg by mouth daily.  Marland Kitchen atorvastatin (LIPITOR) 20 MG tablet TAKE 1 TABLET BY MOUTH EVERY DAY AT 6:00 PM  . azelastine (ASTELIN) 0.1 % nasal spray USE 1 SPRAY IN EACH NOSTRIL TWICE DAILY AS DIRECTED  . azithromycin (ZITHROMAX) 250 MG tablet Take 250 mg by mouth daily. 2 prior any procedure  . Calcium Carbonate-Vitamin D 600-400 MG-UNIT tablet Take 1 tablet by mouth 2 (two) times daily.  . cetirizine (ZYRTEC) 10 MG tablet Take 10 mg daily by mouth.  . citalopram (CELEXA) 40 MG tablet TAKE 1 TABLET BY MOUTH EVERY DAY  . cyanocobalamin 1000 MCG tablet Take 1,000 mcg by mouth daily.  . Dentifrices (FLUORIDE TOOTHPASTE DT) Place onto teeth.  . diphenhydrAMINE (SOMINEX) 25 MG tablet Take 25 mg by mouth at bedtime as needed for sleep.  Marland Kitchen docusate sodium (COLACE) 100 MG capsule Take 300  mg by mouth 2 (two) times daily as needed for mild constipation.   Marland Kitchen EMGALITY 120 MG/ML SOAJ   . famotidine (PEPCID) 20 MG tablet TAKE 1 TABLET BY MOUTH DAILY AS NEEDED FOR HEARTBURN OR INDIGESTION  . furosemide (LASIX) 20 MG tablet Take 1 tablet (20 mg total) by mouth daily. May take extra tab if weight up greater 3 pounds  . gabapentin (NEURONTIN) 100 MG capsule Start Gabapentin 100 mg (one tablet) in the morning, 100 mg in the afternoon (one tablet), and 200 mg (two tablets) in the evening.  . Magnesium Hydroxide (MILK OF MAGNESIA PO) Take by mouth as needed.  . Magnesium Oxide, Antacid, 500 MG CAPS Take 1 tablet by mouth as needed.  . Melatonin 1 MG TABS Take 1 tablet by mouth as needed.   . Omega-3 Fatty Acids (FISH OIL) 1200 MG CAPS Take 2 capsules by mouth daily.   Marland Kitchen omeprazole (PRILOSEC) 20 MG capsule TAKE 1 CAPSULE(20 MG) BY MOUTH DAILY  . phenazopyridine (AZO-TABS) 95 MG tablet Take 1 tablet (95 mg total) by mouth as needed.  . potassium gluconate 595 (99 K) MG TABS tablet Take 1 tablet (595 mg total) by mouth 2 (two) times daily.  . Simethicone (GAS RELIEF PO)  Take by mouth as needed.   . sodium chloride (OCEAN) 0.65 % nasal spray Reported on 01/30/2016  . traMADol (ULTRAM) 50 MG tablet TAKE ONE TABLET BY MOUTH TWO TIMES A DAY AS NEEDED  . [DISCONTINUED] lisinopril (ZESTRIL) 5 MG tablet TAKE 1 TABLET BY MOUTH DAILY  . [DISCONTINUED] lubiprostone (AMITIZA) 24 MCG capsule Take 1 capsule (24 mcg total) by mouth 2 (two) times daily with a meal.  . [DISCONTINUED] mometasone (NASONEX) 50 MCG/ACT nasal spray SHAKE LIQUID AND USE 2 SPRAYS IN EACH NOSTRIL DAILY   No facility-administered medications prior to visit.    Review of Systems  Constitutional: Positive for appetite change (Pt reports not having an appetite). Negative for activity change, chills, diaphoresis, fatigue, fever and unexpected weight change.  HENT: Positive for congestion, ear pain, postnasal drip, rhinorrhea, sinus  pressure, sinus pain and sore throat. Negative for ear discharge.   Respiratory: Positive for shortness of breath. Negative for apnea, cough, choking, chest tightness, wheezing and stridor.   Cardiovascular: Positive for leg swelling. Negative for chest pain and palpitations.  Gastrointestinal: Positive for abdominal distention, constipation and nausea. Negative for abdominal pain, anal bleeding, blood in stool, diarrhea, rectal pain and vomiting.  Allergic/Immunologic: Positive for environmental allergies.  Neurological: Positive for headaches. Negative for dizziness and light-headedness.    Last CBC Lab Results  Component Value Date   WBC 6.3 01/19/2019   HGB 12.7 01/19/2019   HCT 37.2 01/19/2019   MCV 88 01/19/2019   MCH 30.0 01/19/2019   RDW 13.1 01/19/2019   PLT 222 59/93/5701   Last metabolic panel Lab Results  Component Value Date   GLUCOSE 83 01/19/2019   NA 138 01/19/2019   K 4.6 01/19/2019   CL 100 01/19/2019   CO2 26 01/19/2019   BUN 17 01/19/2019   CREATININE 0.76 01/19/2019   GFRNONAA 73 01/19/2019   GFRAA 84 01/19/2019   CALCIUM 10.0 01/19/2019   PROT 6.8 12/27/2018   ALBUMIN 4.5 12/27/2018   LABGLOB 2.3 12/27/2018   AGRATIO 2.0 12/27/2018   BILITOT 0.3 12/27/2018   ALKPHOS 63 12/27/2018   AST 16 12/27/2018   ALT 19 12/27/2018   ANIONGAP 7 09/28/2018   Last hemoglobin A1c Lab Results  Component Value Date   HGBA1C 5.8 (H) 12/27/2018        Objective:    BP (!) 149/74 (BP Location: Left Arm, Patient Position: Sitting, Cuff Size: Large)   Pulse 60   Temp (!) 96.6 F (35.9 C) (Temporal)   Wt 171 lb (77.6 kg)   BMI 34.54 kg/m  BP Readings from Last 3 Encounters:  02/27/20 (!) 149/74  11/08/19 119/66  08/05/19 (!) 124/54   Wt Readings from Last 3 Encounters:  02/27/20 171 lb (77.6 kg)  11/08/19 173 lb 9.6 oz (78.7 kg)  08/05/19 182 lb (82.6 kg)      Physical Exam Constitutional:      General: She is not in acute distress.     Appearance: Normal appearance. She is well-developed. She is obese. She is not ill-appearing or diaphoretic.  HENT:     Head: Normocephalic and atraumatic.     Right Ear: Tympanic membrane, ear canal and external ear normal.     Left Ear: Tympanic membrane, ear canal and external ear normal.     Nose:     Right Sinus: Maxillary sinus tenderness and frontal sinus tenderness present.     Left Sinus: Maxillary sinus tenderness and frontal sinus tenderness present.  Eyes:  General: No scleral icterus.       Right eye: No discharge.        Left eye: No discharge.     Extraocular Movements: Extraocular movements intact.     Conjunctiva/sclera: Conjunctivae normal.     Pupils: Pupils are equal, round, and reactive to light.  Cardiovascular:     Rate and Rhythm: Normal rate and regular rhythm.     Pulses: Normal pulses.     Heart sounds: Normal heart sounds. No murmur. No friction rub. No gallop.   Pulmonary:     Effort: Pulmonary effort is normal. No respiratory distress.     Breath sounds: Normal breath sounds. No wheezing or rales.  Abdominal:     General: Abdomen is protuberant. Bowel sounds are normal. There is distension.     Palpations: Abdomen is soft. There is no mass.     Tenderness: There is no abdominal tenderness. There is no guarding or rebound.  Musculoskeletal:     Right lower leg: No edema.     Left lower leg: No edema.  Skin:    General: Skin is warm and dry.  Neurological:     Mental Status: She is alert and oriented to person, place, and time.       No results found for any visits on 02/27/20.    Assessment & Plan:    1. Seasonal allergies Mometasone no longer covered. Flonase had made nose to sensitive. Will see if flonase sensimist will be better covered and more gentle on the nasal passages.  - fluticasone (FLONASE SENSIMIST) 27.5 MCG/SPRAY nasal spray; Place 2 sprays into the nose daily.  Dispense: 10 g; Refill: 12  2. Chronic idiopathic  constipation Amitiza 68mcg BID no longer covered also not working as well. Will change therapy to Trulance as below. If not effective may consider Linzess 231mcg. Referral also placed to GI as below. Patient previously seen by Dr. Vira Agar, but he has since retired. She continues to have the significant constipation, and now with bloating, distension and nausea despite treatments. She and her daughter-in-law agree with referral.  - Plecanatide (TRULANCE) 3 MG TABS; Take 3 mg by mouth daily.  Dispense: 90 tablet; Refill: 1 - Ambulatory referral to Gastroenterology  3. Abdominal bloating See above medical treatment plan. - Ambulatory referral to Gastroenterology  4. Irritable bowel syndrome with constipation See above medical treatment plan. - Ambulatory referral to Gastroenterology  5. Nausea See above medical treatment plan. - Ambulatory referral to Gastroenterology  6. Class 1 obesity due to excess calories with serious comorbidity and body mass index (BMI) of 34.0 to 34.9 in adult Counseled patient on healthy lifestyle modifications including dieting and exercise.  - CBC w/Diff/Platelet - Comprehensive Metabolic Panel (CMET) - HgB A1c  7. Recurrent major depressive disorder, in partial remission ([facility]) Stable. Continue Citalopram 40mg  and alprazolam 0.25mg  BID prn.  - CBC w/Diff/Platelet  8. Atherosclerosis of aorta ([facility]) Stable. Continue atorvastatin.  - CBC w/Diff/Platelet - Comprehensive Metabolic Panel (CMET) - Lipid Panel With LDL/HDL Ratio - HgB A1c  9. Essential (primary) hypertension Stable. Diagnosis pulled for medication refill. Continue current medical treatment plan. Will check labs as below and f/u pending results. - CBC w/Diff/Platelet - Comprehensive Metabolic Panel (CMET) - Lipid Panel With LDL/HDL Ratio - HgB A1c - lisinopril (ZESTRIL) 5 MG tablet; TAKE 1 TABLET BY MOUTH DAILY  Dispense: 90 tablet; Refill: 1  10. Chronic maxillary sinusitis Stable. Continue  all allergy medications. See nasal spray change under treatment plan #  1 due to insurance coverage.   11. Vitamin D deficiency H/O this and postmenopausal with osteopenia. Will check labs as below and f/u pending results. - Vitamin D (25 hydroxy)  12. Arthritis Noted to have multiple joint pains. Patient requesting to r/o RA.  - ANA,IFA RA Diag Pnl w/rflx Tit/Patn  13. Prediabetes Diet controlled. Will check labs as below and f/u pending results. - CBC w/Diff/Platelet - Comprehensive Metabolic Panel (CMET) - HgB A1c  14. Hypercholesterolemia Stable. Continue Atorvastatin 20mg . Will check labs as below and f/u pending results. - CBC w/Diff/Platelet - Comprehensive Metabolic Panel (CMET) - Lipid Panel With LDL/HDL Ratio - HgB A1c    I spent approximately 45 minutes with the patient today. Over 50% of this time was spent with counseling and educating the patient.  Rubye Beach  [facility] [phone] (phone) [phone] (fax)  [place]

## 2020-02-27 NOTE — Patient Instructions (Signed)
Trulance/Plecanatide oral tablets What is this medicine? PLECANATIDE (ple kan a tide) is used to treat chronic constipation and irritable bowel syndrome (IBS-C) with constipation as the main problem. This medicine may be used for other purposes; ask your health care provider or pharmacist if you have questions. COMMON BRAND NAME(S): Trulance What should I tell my health care provider before I take this medicine? They need to know if you have any of these conditions:  now have diarrhea or have diarrhea often  stomach or intestinal disease, including bowel obstruction or abdominal adhesions  an unusual or allergic reaction to plecanatide, other medicines, foods, dyes, or preservatives  pregnant or trying to get pregnant  breast-feeding How should I use this medicine? Take this medicine by mouth with a glass of water. Follow the directions on the prescription label. Swallow tablets whole. You can take this medicine with or without food. Take your medicine at regular intervals. Do not take your medicine more often than directed. Do not stop taking except on your doctor's advice. A special MedGuide will be given to you by the pharmacist with each prescription and refill. Be sure to read this information carefully each time. Talk to your pediatrician regarding the use of this medicine in children. This medicine is not approved for use in children. Overdosage: If you think you have taken too much of this medicine contact a poison control center or emergency room at once. NOTE: This medicine is only for you. Do not share this medicine with others. What if I miss a dose? If you miss a dose, just skip that dose. Wait until your next dose, and take only that dose. Do not take double or extra doses. What may interact with this medicine?  certain medicines for bowel problems or bladder incontinence (these can cause constipation) This list may not describe all possible interactions. Give your health  care provider a list of all the medicines, herbs, non-prescription drugs, or dietary supplements you use. Also tell them if you smoke, drink alcohol, or use illegal drugs. Some items may interact with your medicine. What should I watch for while using this medicine? Visit your doctor for regular check ups. Tell your doctor if your symptoms do not get better or if they get worse. Diarrhea is a common side effect of this medicine. It often begins within 2 weeks of starting this medicine. Stop taking this medicine and call your doctor if you get severe diarrhea. What side effects may I notice from receiving this medicine? Side effects that you should report to your doctor or health care professional as soon as possible:  allergic reactions like skin rash, itching or hives, swelling of the face, lips, or tongue  fever or chills; cough; sore throat; pain or trouble passing urine  severe or prolonged diarrhea Side effects that usually do not require medical attention (Report these to your doctor or health care professional if they continue or are bothersome):  bloating  gas This list may not describe all possible side effects. Call your doctor for medical advice about side effects. You may report side effects to FDA at 1-800-FDA-1088. Where should I keep my medicine? Keep out of the reach of children. Store at room temperature between 20 and 25 degrees C (68 and 77 degrees F). Keep this medicine in the original container. Keep tightly closed in a dry place. Do not remove the desiccant packet from the bottle, it helps to protect your medicine from moisture. Throw away any unused medicine after  the expiration date. NOTE: This sheet is a summary. It may not cover all possible information. If you have questions about this medicine, talk to your doctor, pharmacist, or health care provider.  2020 Elsevier/Gold Standard (2016-12-12 11:35:51)

## 2020-02-28 DIAGNOSIS — M7062 Trochanteric bursitis, left hip: Secondary | ICD-10-CM | POA: Diagnosis not present

## 2020-02-28 DIAGNOSIS — M5136 Other intervertebral disc degeneration, lumbar region: Secondary | ICD-10-CM | POA: Diagnosis not present

## 2020-02-28 DIAGNOSIS — M48062 Spinal stenosis, lumbar region with neurogenic claudication: Secondary | ICD-10-CM | POA: Diagnosis not present

## 2020-02-28 DIAGNOSIS — M5416 Radiculopathy, lumbar region: Secondary | ICD-10-CM | POA: Diagnosis not present

## 2020-02-29 LAB — COMPREHENSIVE METABOLIC PANEL
ALT: 25 IU/L (ref 0–32)
AST: 21 IU/L (ref 0–40)
Albumin/Globulin Ratio: 2 (ref 1.2–2.2)
Albumin: 4.2 g/dL (ref 3.6–4.6)
Alkaline Phosphatase: 61 IU/L (ref 39–117)
BUN/Creatinine Ratio: 13 (ref 12–28)
BUN: 11 mg/dL (ref 8–27)
Bilirubin Total: 0.3 mg/dL (ref 0.0–1.2)
CO2: 21 mmol/L (ref 20–29)
Calcium: 9.6 mg/dL (ref 8.7–10.3)
Chloride: 103 mmol/L (ref 96–106)
Creatinine, Ser: 0.86 mg/dL (ref 0.57–1.00)
GFR calc Af Amer: 72 mL/min/{1.73_m2} (ref >59)
GFR calc non Af Amer: 62 mL/min/{1.73_m2} (ref >59)
Globulin, Total: 2.1 g/dL (ref 1.5–4.5)
Glucose: 98 mg/dL (ref 65–99)
Potassium: 4.5 mmol/L (ref 3.5–5.2)
Sodium: 140 mmol/L (ref 134–144)
Total Protein: 6.3 g/dL (ref 6.0–8.5)

## 2020-02-29 LAB — CBC WITH DIFFERENTIAL/PLATELET
Basophils Absolute: 0.1 10*3/uL (ref 0.0–0.2)
Basos: 1 %
EOS (ABSOLUTE): 0.3 10*3/uL (ref 0.0–0.4)
Eos: 6 %
Hematocrit: 34.8 % (ref 34.0–46.6)
Hemoglobin: 12 g/dL (ref 11.1–15.9)
Immature Grans (Abs): 0 10*3/uL (ref 0.0–0.1)
Immature Granulocytes: 0 %
Lymphocytes Absolute: 1.2 10*3/uL (ref 0.7–3.1)
Lymphs: 28 %
MCH: 30.7 pg (ref 26.6–33.0)
MCHC: 34.5 g/dL (ref 31.5–35.7)
MCV: 89 fL (ref 79–97)
Monocytes Absolute: 0.5 10*3/uL (ref 0.1–0.9)
Monocytes: 11 %
Neutrophils Absolute: 2.3 10*3/uL (ref 1.4–7.0)
Neutrophils: 54 %
Platelets: 218 10*3/uL (ref 150–450)
RBC: 3.91 x10E6/uL (ref 3.77–5.28)
RDW: 12.5 % (ref 11.7–15.4)
WBC: 4.3 10*3/uL (ref 3.4–10.8)

## 2020-02-29 LAB — ANA,IFA RA DIAG PNL W/RFLX TIT/PATN
ANA Titer 1: NEGATIVE
Cyclic Citrullin Peptide Ab: 4 units (ref 0–19)
Rheumatoid fact SerPl-aCnc: <10 IU/mL (ref 0.0–13.9)

## 2020-02-29 LAB — LIPID PANEL WITH LDL/HDL RATIO
Cholesterol, Total: 169 mg/dL (ref 100–199)
HDL: 41 mg/dL (ref >39)
LDL Chol Calc (NIH): 102 mg/dL — ABNORMAL HIGH (ref 0–99)
LDL/HDL Ratio: 2.5 ratio (ref 0.0–3.2)
Triglycerides: 146 mg/dL (ref 0–149)
VLDL Cholesterol Cal: 26 mg/dL (ref 5–40)

## 2020-02-29 LAB — HEMOGLOBIN A1C
Est. average glucose Bld gHb Est-mCnc: 100 mg/dL
Hgb A1c MFr Bld: 5.1 % (ref 4.8–5.6)

## 2020-02-29 LAB — VITAMIN D 25 HYDROXY (VIT D DEFICIENCY, FRACTURES): Vit D, 25-Hydroxy: 38.6 ng/mL (ref 30.0–100.0)

## 2020-03-13 ENCOUNTER — Telehealth: Payer: Medicare Other | Admitting: Gastroenterology

## 2020-03-27 DIAGNOSIS — M5136 Other intervertebral disc degeneration, lumbar region: Secondary | ICD-10-CM | POA: Diagnosis not present

## 2020-03-27 DIAGNOSIS — M48062 Spinal stenosis, lumbar region with neurogenic claudication: Secondary | ICD-10-CM | POA: Diagnosis not present

## 2020-03-27 DIAGNOSIS — M5416 Radiculopathy, lumbar region: Secondary | ICD-10-CM | POA: Diagnosis not present

## 2020-04-04 ENCOUNTER — Ambulatory Visit: Payer: Medicare Other | Admitting: Gastroenterology

## 2020-04-11 ENCOUNTER — Encounter: Payer: Self-pay | Admitting: Gastroenterology

## 2020-04-11 ENCOUNTER — Other Ambulatory Visit: Payer: Self-pay

## 2020-04-11 ENCOUNTER — Ambulatory Visit (INDEPENDENT_AMBULATORY_CARE_PROVIDER_SITE_OTHER): Payer: Medicare Other | Admitting: Gastroenterology

## 2020-04-11 VITALS — BP 150/68 | HR 71 | Temp 98.5°F | Wt 172.0 lb

## 2020-04-11 DIAGNOSIS — R140 Abdominal distension (gaseous): Secondary | ICD-10-CM

## 2020-04-11 DIAGNOSIS — K5909 Other constipation: Secondary | ICD-10-CM

## 2020-04-11 MED ORDER — POLYETHYLENE GLYCOL 3350 17 G PO PACK: 17.0000 g | PACK | Freq: Every day | ORAL | 0 refills | Status: AC

## 2020-04-11 MED ORDER — LINACLOTIDE 145 MCG PO CAPS
145.0000 ug | ORAL_CAPSULE | Freq: Every day | ORAL | 2 refills | Status: DC
Start: 2020-04-11 — End: 2020-06-29

## 2020-04-11 NOTE — Patient Instructions (Addendum)
Please stop taking Trulance and start taking Linzess 145 mcg 1 capsule daily.   High-Fiber Diet Fiber, also called dietary fiber, is a type of carbohydrate that is found in fruits, vegetables, whole grains, and beans. A high-fiber diet can have many health benefits. Your health care provider may recommend a high-fiber diet to help:  Prevent constipation. Fiber can make your bowel movements more regular.  Lower your cholesterol.  Relieve the following conditions: ? Swelling of veins in the anus (hemorrhoids). ? Swelling and irritation (inflammation) of specific areas of the digestive tract (uncomplicated diverticulosis). ? A problem of the large intestine (colon) that sometimes causes pain and diarrhea (irritable bowel syndrome, IBS).  Prevent overeating as part of a weight-loss plan.  Prevent heart disease, type 2 diabetes, and certain cancers. What is my plan? The recommended daily fiber intake in grams (g) includes:  38 g for men age 63 or younger.  30 g for men over age 60.  24 g for women age 90 or younger.  21 g for women over age 51. You can get the recommended daily intake of dietary fiber by:  Eating a variety of fruits, vegetables, grains, and beans.  Taking a fiber supplement, if it is not possible to get enough fiber through your diet. What do I need to know about a high-fiber diet?  It is better to get fiber through food sources rather than from fiber supplements. There is not a lot of research about how effective supplements are.  Always check the fiber content on the nutrition facts label of any prepackaged food. Look for foods that contain 5 g of fiber or more per serving.  Talk with a diet and nutrition specialist (dietitian) if you have questions about specific foods that are recommended or not recommended for your medical condition, especially if those foods are not listed below.  Gradually increase how much fiber you consume. If you increase your intake of  dietary fiber too quickly, you may have bloating, cramping, or gas.  Drink plenty of water. Water helps you to digest fiber. What are tips for following this plan?  Eat a wide variety of high-fiber foods.  Make sure that half of the grains that you eat each day are whole grains.  Eat breads and cereals that are made with whole-grain flour instead of refined flour or white flour.  Eat brown rice, bulgur wheat, or millet instead of white rice.  Start the day with a breakfast that is high in fiber, such as a cereal that contains 5 g of fiber or more per serving.  Use beans in place of meat in soups, salads, and pasta dishes.  Eat high-fiber snacks, such as berries, raw vegetables, nuts, and popcorn.  Choose whole fruits and vegetables instead of processed forms like juice or sauce. What foods can I eat?  Fruits Berries. Pears. Apples. Oranges. Avocado. Prunes and raisins. Dried figs. Vegetables Sweet potatoes. Spinach. Kale. Artichokes. Cabbage. Broccoli. Cauliflower. Green peas. Carrots. Squash. Grains Whole-grain breads. Multigrain cereal. Oats and oatmeal. Brown rice. Barley. Bulgur wheat. [place]. Quinoa. Bran muffins. Popcorn. Rye wafer crackers. Meats and other proteins Navy, kidney, and pinto beans. Soybeans. Split peas. Lentils. Nuts and seeds. Dairy Fiber-fortified yogurt. Beverages Fiber-fortified soy milk. Fiber-fortified orange juice. Other foods Fiber bars. The items listed above may not be a complete list of recommended foods and beverages. Contact a dietitian for more options. What foods are not recommended? Fruits Fruit juice. Cooked, strained fruit. Vegetables Fried potatoes. Canned vegetables.  Well-cooked vegetables. Grains White bread. Pasta made with refined flour. White rice. Meats and other proteins Fatty cuts of meat. Fried chicken or fried fish. Dairy Milk. Yogurt. Cream cheese. Sour cream. Fats and oils Butters. Beverages Soft drinks. Other  foods Cakes and pastries. The items listed above may not be a complete list of foods and beverages to avoid. Contact a dietitian for more information. Summary  Fiber is a type of carbohydrate. It is found in fruits, vegetables, whole grains, and beans.  There are many health benefits of eating a high-fiber diet, such as preventing constipation, lowering blood cholesterol, helping with weight loss, and reducing your risk of heart disease, diabetes, and certain cancers.  Gradually increase your intake of fiber. Increasing too fast can result in cramping, bloating, and gas. Drink plenty of water while you increase your fiber.  The best sources of fiber include whole fruits and vegetables, whole grains, nuts, seeds, and beans. This information is not intended to replace advice given to you by your health care provider. Make sure you discuss any questions you have with your health care provider. Document Revised: 09/07/2017 Document Reviewed: 09/07/2017 Elsevier Patient Education  2020 [place]  FODMAPs (fermentable oligosaccharides, disaccharides, monosaccharides, and polyols) are sugars that are hard for some people to digest. A low-FODMAP eating plan may help some people who have bowel (intestinal) diseases to manage their symptoms. This meal plan can be complicated to follow. Work with a diet and nutrition specialist (dietitian) to make a low-FODMAP eating plan that is right for you. A dietitian can make sure that you get enough nutrition from this diet. What are tips for following this plan? Reading food labels  Check labels for hidden FODMAPs such as: ? High-fructose syrup. ? Honey. ? Agave. ? Natural fruit flavors. ? Onion or garlic powder.  Choose low-FODMAP foods that contain 3-4 grams of fiber per serving.  Check food labels for serving sizes. Eat only one serving at a time to make sure FODMAP levels stay low. Meal planning  Follow a low-FODMAP  eating plan for up to 6 weeks, or as told by your health care provider or dietitian.  To follow the eating plan: 1. Eliminate high-FODMAP foods from your diet completely. 2. Gradually reintroduce high-FODMAP foods into your diet one at a time. Most people should wait a few days after introducing one high-FODMAP food before they introduce the next high-FODMAP food. Your dietitian can recommend how quickly you may reintroduce foods. 3. Keep a daily record of what you eat and drink, and make note of any symptoms that you have after eating. 4. Review your daily record with a dietitian regularly. Your dietitian can help you identify which foods you can eat and which foods you should avoid. General tips  Drink enough fluid each day to keep your urine pale yellow.  Avoid processed foods. These often have added sugar and may be high in FODMAPs.  Avoid most dairy products, whole grains, and sweeteners.  Work with a dietitian to make sure you get enough fiber in your diet. Recommended foods Grains  Gluten-free grains, such as rice, oats, buckwheat, quinoa, corn, polenta, and millet. Gluten-free pasta, bread, or cereal. Rice noodles. Corn tortillas. Vegetables  Eggplant, zucchini, cucumber, peppers, green beans, Brussels sprouts, bean sprouts, lettuce, arugula, kale, Swiss chard, spinach, collard greens, bok choy, summer squash, potato, and tomato. Limited amounts of corn, carrot, and sweet potato. Green parts of scallions. Fruits  Bananas, oranges, lemons, limes, blueberries,  raspberries, strawberries, grapes, cantaloupe, honeydew melon, kiwi, papaya, passion fruit, and pineapple. Limited amounts of dried cranberries, banana chips, and shredded coconut. Dairy  Lactose-free milk, yogurt, and kefir. Lactose-free cottage cheese and ice cream. Non-dairy milks, such as almond, coconut, hemp, and rice milk. Yogurts made of non-dairy milks. Limited amounts of goat cheese, brie, mozzarella, parmesan,  swiss, and other hard cheeses. Meats and other protein foods  Unseasoned beef, pork, poultry, or fish. Eggs. Berniece Salines. Tofu (firm) and tempeh. Limited amounts of nuts and seeds, such as almonds, walnuts, [place] nuts, pecans, peanuts, pumpkin seeds, chia seeds, and sunflower seeds. Fats and oils  Butter-free spreads. Vegetable oils, such as olive, canola, and sunflower oil. Seasoning and other foods  Artificial sweeteners with names that do not end in "ol" such as aspartame, saccharine, and stevia. Maple syrup, white table sugar, raw sugar, brown sugar, and molasses. Fresh basil, coriander, parsley, rosemary, and thyme. Beverages  Water and mineral water. Sugar-sweetened soft drinks. Small amounts of orange juice or cranberry juice. Black and green tea. Most dry wines. Coffee. This may not be a complete list of low-FODMAP foods. Talk with your dietitian for more information. Foods to avoid Grains  Wheat, including kamut, durum, and semolina. Barley and bulgur. Couscous. Wheat-based cereals. Wheat noodles, bread, crackers, and pastries. Vegetables  Chicory root, artichoke, asparagus, cabbage, snow peas, sugar snap peas, mushrooms, and cauliflower. Onions, garlic, leeks, and the white part of scallions. Fruits  Fresh, dried, and juiced forms of apple, pear, watermelon, peach, plum, cherries, apricots, blackberries, boysenberries, figs, nectarines, and mango. Avocado. Dairy  Milk, yogurt, ice cream, and soft cheese. Cream and sour cream. Milk-based sauces. Custard. Meats and other protein foods  Fried or fatty meat. Sausage. Cashews and pistachios. Soybeans, baked beans, black beans, chickpeas, kidney beans, fava beans, navy beans, lentils, and split peas. Seasoning and other foods  Any sugar-free gum or candy. Foods that contain artificial sweeteners such as sorbitol, mannitol, isomalt, or xylitol. Foods that contain honey, high-fructose corn syrup, or agave. Bouillon, vegetable stock, beef  stock, and chicken stock. Garlic and onion powder. Condiments made with onion, such as hummus, chutney, pickles, relish, salad dressing, and salsa. Tomato paste. Beverages  Chicory-based drinks. Coffee substitutes. Chamomile tea. Fennel tea. Sweet or fortified wines such as port or sherry. Diet soft drinks made with isomalt, mannitol, maltitol, sorbitol, or xylitol. Apple, pear, and mango juice. Juices with high-fructose corn syrup. This may not be a complete list of high-FODMAP foods. Talk with your dietitian to discuss what dietary choices are best for you.  Summary  A low-FODMAP eating plan is a short-term diet that eliminates FODMAPs from your diet to help ease symptoms of certain bowel diseases.  The eating plan usually lasts up to 6 weeks. After that, high-FODMAP foods are restarted gradually, one at a time, so you can find out which may be causing symptoms.  A low-FODMAP eating plan can be complicated. It is best to work with a dietitian who has experience with this type of plan. This information is not intended to replace advice given to you by your health care provider. Make sure you discuss any questions you have with your health care provider. Document Revised: 10/16/2017 Document Reviewed: 06/30/2017 Elsevier Patient Education  [place].

## 2020-04-11 NOTE — Progress Notes (Signed)
Helen Ramsey [address]  [place],  [address]  Main: [phone]  Fax: [phone]   Gastroenterology Consultation  Referring Provider:     Florian Buff* Primary Care Physician:  Rubye Beach Reason for Consultation:   Constipation        HPI:    Chief Complaint  Patient presents with  . New Patient (Initial Visit)  . Constipation    Patient stated that for several years she has had constipation.    Helen Ramsey is a 86 y.o. y/o female referred for consultation & management  by Dr. Marlyn Corporal, Clearnce Sorrel, PA-C.  Patient reports chronic history of constipation.  Previously on Amitiza which did not help and was recently switched to Trulance about a month ago.  Has not noticed any changes in her bowel movements with it.  Also takes over-the-counter Colace 3 times a day in addition to that, and sometimes has to take milk of magnesia if does not have a bowel movement in 2 to 3 days.  If she goes 3 days without a bowel movement she states  that she disimpact herself.  Does drain intermittently.  No recent blood in stool.  Also reports "queasiness", but no vomiting.  States she does not drink enough water, but has tried to increase this.  Is also trying to increase fiber in her diet.  Has noticed that raw onions causes more bloating and nausea.  Also lactose products do the same.  Previously seen by Dr. Vira Agar for the same.  Last colonoscopy in 2014 with 4 polyps removed.  Pathology showed tubular adenoma in 2 of the polyps.  Others were benign.  No family history of colon cancer.  Upper endoscopy in March 2014 showed gastritis and hiatal hernia.  Biopsies negative for H. pylori.  Past Medical History:  Diagnosis Date  . Allergic rhinitis, cause unspecified   . Allergic rhinitis, cause unspecified   . Atherosclerosis of aorta ([place])   . Back pain    lumbar  . Degeneration of lumbar or lumbosacral intervertebral disc   .  Diverticulitis of colon (without mention of hemorrhage)(562.11)   . Dizziness and giddiness   . Dysthymic disorder   . Headache(784.0)   . Myalgia and myositis, unspecified   . Osteoporosis, unspecified   . Other and unspecified hyperlipidemia   . Other diseases of nasal cavity and sinuses(478.19)   . Other specified disorders of bladder   . Syncope   . Trigger finger (acquired)   . Unspecified essential hypertension   . Unspecified sleep apnea   . Urinary frequency     Past Surgical History:  Procedure Laterality Date  . ABDOMINAL HYSTERECTOMY    . ADENOIDECTOMY    . APPENDECTOMY    . ARTHROSCOPIC KNEE SURGERY    . BREAST BIOPSY Right    lymph node surgery, benign   . CARDIAC CATHETERIZATION     [facility]  . CARPAL TUNNEL RELEASE    . CATARACT EXTRACTION W/ INTRAOCULAR LENS IMPLANT & ANTERIOR VITRECTOMY, BILATERAL    . CHOLECYSTECTOMY    . KNEE ARTHROPLASTY Right 01/30/2016   Procedure: COMPUTER ASSISTED TOTAL KNEE ARTHROPLASTY;  Surgeon: Dereck Leep, MD;  Location: [facility] ORS;  Service: Orthopedics;  Laterality: Right;  . R/O Lymph Nodes; Right Axilla    . ROTATOR CUFF REPAIR Right   . SPG block  03/20/2016   sphenopalatine ganglion and maxillary division of trigeminal nerve block block Dr. Manuella Ghazi  .  TEMPORAL ARTERY BIOPSY / LIGATION    . TONSILLECTOMY    . TUBAL LIGATION      Prior to Admission medications   Medication Sig Start Date End Date Taking? Authorizing Provider  acetaminophen (TYLENOL) 500 MG tablet Take 500 mg by mouth daily.    Yes [provider]  ALPRAZolam Duanne Moron) 0.25 MG tablet TAKE ONE TABLET BY MOUTH TWO TIMES A DAY AS NEEDED FOR ANXIETY 11/08/19  Yes Mar Daring, PA-C  aspirin EC 81 MG tablet Take 81 mg by mouth daily.   Yes [provider]  atorvastatin (LIPITOR) 20 MG tablet TAKE 1 TABLET BY MOUTH EVERY DAY AT 6:00 PM 11/08/19  Yes Burnette, Anderson Malta M, PA-C  azelastine (ASTELIN) 0.1 % nasal spray USE 1 SPRAY IN EACH NOSTRIL  TWICE DAILY AS DIRECTED 12/07/19  Yes Fenton Malling M, PA-C  azithromycin (ZITHROMAX) 250 MG tablet Take 250 mg by mouth daily. 2 prior any procedure   Yes [provider]  Calcium Carbonate-Vitamin D 600-400 MG-UNIT tablet Take 1 tablet by mouth 2 (two) times daily. 12/19/15  Yes Margarita Rana, MD  cetirizine (ZYRTEC) 10 MG tablet Take 10 mg daily by mouth.   Yes [provider]  citalopram (CELEXA) 40 MG tablet TAKE 1 TABLET BY MOUTH EVERY DAY 10/19/19  Yes Mar Daring, PA-C  cyanocobalamin 1000 MCG tablet Take 1,000 mcg by mouth daily.   Yes [provider]  Dentifrices (FLUORIDE TOOTHPASTE DT) Place onto teeth.   Yes [provider]  docusate sodium (COLACE) 100 MG capsule Take 300 mg by mouth 2 (two) times daily as needed for mild constipation.    Yes [provider]  EMGALITY 120 MG/ML SOAJ  09/18/18  Yes [provider]  famotidine (PEPCID) 20 MG tablet TAKE 1 TABLET BY MOUTH DAILY AS NEEDED FOR HEARTBURN OR INDIGESTION 01/17/20  Yes Burnette, Jennifer M, PA-C  fluticasone (FLONASE SENSIMIST) 27.5 MCG/SPRAY nasal spray Place 2 sprays into the nose daily. 02/27/20  Yes Mar Daring, PA-C  furosemide (LASIX) 20 MG tablet Take 1 tablet (20 mg total) by mouth daily. May take extra tab if weight up greater 3 pounds 11/08/19  Yes Fenton Malling M, PA-C  gabapentin (NEURONTIN) 100 MG capsule Start Gabapentin 100 mg (one tablet) in the morning, 100 mg in the afternoon (one tablet), and 200 mg (two tablets) in the evening. 12/17/18  Yes Burnette, Clearnce Sorrel, PA-C  lisinopril (ZESTRIL) 5 MG tablet TAKE 1 TABLET BY MOUTH DAILY 02/27/20  Yes Mar Daring, PA-C  Magnesium Hydroxide (MILK OF MAGNESIA PO) Take by mouth as needed.   Yes [provider]  Magnesium Oxide, Antacid, 500 MG CAPS Take 1 tablet by mouth as needed.   Yes [provider]  Melatonin 1 MG TABS Take 1 tablet by mouth as needed.    Yes  [provider]  Omega-3 Fatty Acids (FISH OIL) 1200 MG CAPS Take 2 capsules by mouth daily.    Yes [provider]  omeprazole (PRILOSEC) 20 MG capsule TAKE 1 CAPSULE(20 MG) BY MOUTH DAILY 07/21/19  Yes Mar Daring, PA-C  phenazopyridine (AZO-TABS) 95 MG tablet Take 1 tablet (95 mg total) by mouth as needed. 05/29/16  Yes Burnette, Clearnce Sorrel, PA-C  Plecanatide (TRULANCE) 3 MG TABS Take 3 mg by mouth daily. 02/27/20  Yes Mar Daring, PA-C  potassium gluconate 595 (99 K) MG TABS tablet Take 1 tablet (595 mg total) by mouth 2 (two) times daily. 12/27/18  Yes Mar Daring, PA-C  Simethicone (GAS RELIEF PO) Take by mouth as needed.    Yes [provider]  sodium chloride (OCEAN) 0.65 % nasal spray Reported on 01/30/2016   Yes [provider]  traMADol (ULTRAM) 50 MG tablet TAKE ONE TABLET BY MOUTH TWO TIMES A DAY AS NEEDED 11/08/19  Yes Mar Daring, PA-C  linaclotide (LINZESS) 145 MCG CAPS capsule Take 1 capsule (145 mcg total) by mouth daily. 04/11/20   Virgel Manifold, MD    Family History  Problem Relation Age of Onset  . Heart disease Mother   . Depression Mother   . Colon polyps Mother   . Dementia Mother   . Heart disease Father   . Dementia Father   . Heart disease Brother   . Hypertension Brother   . Heart disease Brother   . Hypertension Brother   . Kidney cancer Neg Hx   . Bladder Cancer Neg Hx      Social History   Tobacco Use  . Smoking status: Never Smoker  . Smokeless tobacco: Never Used  Substance Use Topics  . Alcohol use: No  . Drug use: No    Allergies as of 04/11/2020 - Review Complete 04/11/2020  Allergen Reaction Noted  . Amoxicillin  07/06/2014  . Clindamycin/lincomycin  07/06/2014  . Doxycycline  07/06/2014  . Levaquin [levofloxacin in d5w]  07/06/2014  . Mirabegron Nausea And Vomiting 05/09/2015  . Nsaids Other (See Comments) 05/09/2015  . Oxycodone Nausea Only 03/31/2016  .  Phenergan [promethazine hcl]  07/06/2014  . Singulair [montelukast]  07/06/2014  . Vicodin [hydrocodone-acetaminophen]  07/06/2014  . Zocor [simvastatin]  07/06/2014  . Zoloft  [sertraline] Other (See Comments) 03/28/2015  . Ampicillin Rash 05/09/2015  . Bactrim [sulfamethoxazole-trimethoprim] Rash 12/13/2015  . Penicillins Rash 03/28/2015  . Tolmetin  05/09/2015    Review of Systems:    All systems reviewed and negative except where noted in HPI.   Physical Exam:  BP (!) 150/68   Pulse 71   Temp 98.5 F (36.9 C) (Oral)   Wt 172 lb (78 kg)   BMI 34.74 kg/m  No LMP recorded. Patient has had a hysterectomy. Psych:  Alert and cooperative. Normal mood and affect. General:   Alert,  Well-developed, well-nourished, pleasant and cooperative in NAD Head:  Normocephalic and atraumatic. Eyes:  Sclera clear, no icterus.   Conjunctiva pink. Ears:  Normal auditory acuity. Nose:  No deformity, discharge, or lesions. Mouth:  No deformity or lesions,oropharynx pink & moist. Neck:  Supple; no masses or thyromegaly. Abdomen:  Normal bowel sounds.  No bruits.  Soft, non-tender and non-distended without masses, hepatosplenomegaly or hernias noted.  No guarding or rebound tenderness.    Msk:  Symmetrical without gross deformities. Good, equal movement & strength bilaterally. Pulses:  Normal pulses noted. Extremities:  No clubbing or edema.  No cyanosis. Neurologic:  Alert and oriented x3;  grossly normal neurologically. Skin:  Intact without significant lesions or rashes. No jaundice. Lymph Nodes:  No significant cervical adenopathy. Psych:  Alert and cooperative. Normal mood and affect.   Labs: CBC    Component Value Date/Time   WBC 4.3 02/27/2020 1028   WBC 5.8 09/28/2018 0927   RBC 3.91 02/27/2020 1028   RBC 4.17 09/28/2018 0927   HGB 12.0 02/27/2020 1028   HCT 34.8 02/27/2020 1028   PLT 218 02/27/2020 1028   MCV 89 02/27/2020 1028   MCV 88 06/21/2014 1540   MCH 30.7 02/27/2020  1028   MCH 30.2 09/28/2018 0927   MCHC 34.5 02/27/2020 1028   MCHC 33.3 09/28/2018 0927   RDW 12.5 02/27/2020 1028   RDW 13.3 06/21/2014 1540   LYMPHSABS 1.2 02/27/2020 1028   MONOABS 0.6 09/28/2018 0927   EOSABS 0.3 02/27/2020 1028   BASOSABS 0.1 02/27/2020 1028   CMP     Component Value Date/Time   NA 140 02/27/2020 1028   NA 140 06/21/2014 1540   K 4.5 02/27/2020 1028   K 3.7 06/21/2014 1540   CL 103 02/27/2020 1028   CL 107 06/21/2014 1540   CO2 21 02/27/2020 1028   CO2 25 06/21/2014 1540   GLUCOSE 98 02/27/2020 1028   GLUCOSE 94 09/28/2018 0927   GLUCOSE 142 (H) 06/21/2014 1540   BUN 11 02/27/2020 1028   BUN 19 (H) 06/21/2014 1540   CREATININE 0.86 02/27/2020 1028   CREATININE 0.83 06/21/2014 1540   CALCIUM 9.6 02/27/2020 1028   CALCIUM 8.8 06/21/2014 1540   PROT 6.3 02/27/2020 1028   PROT 7.1 06/21/2014 1540   ALBUMIN 4.2 02/27/2020 1028   ALBUMIN 3.5 06/21/2014 1540   AST 21 02/27/2020 1028   AST 23 06/21/2014 1540   ALT 25 02/27/2020 1028   ALT 33 06/21/2014 1540   ALKPHOS 61 02/27/2020 1028   ALKPHOS 69 06/21/2014 1540   BILITOT 0.3 02/27/2020 1028   BILITOT 0.4 06/21/2014 1540   GFRNONAA 62 02/27/2020 1028   GFRNONAA >60 06/21/2014 1540   GFRAA 72 02/27/2020 1028   GFRAA >60 06/21/2014 1540    Imaging Studies: No results found.  Assessment and Plan:   CYNARA TATHAM is a 86 y.o. y/o female has been referred for chronic constipation, with ongoing symptoms despite medications  Patient does not eat enough fiber High-fiber diet discussed and handout given for the same  Patient advised to increase water intake, 8 to 10 glasses of water a day  DC Trulance.  Start Linzess  Also take MiraLAX daily in addition to that given severe chronic constipation.  Stop other stool softeners.  Patient not interested in polyp surveillance colonoscopy after discussing risks and benefits of the procedure in detail  Patient and daughter are agreeable with the  above plan  HPylori testing as patient complaining of nausea  Patient also likely has lactose intolerance and was advised to eat a lactose-free diet  High FODMAP foods discussed as well and she was advised to avoid those as well   Dr Helen Ramsey  Speech recognition software was used to dictate the above note.

## 2020-04-12 ENCOUNTER — Telehealth: Payer: Self-pay | Admitting: Gastroenterology

## 2020-04-12 NOTE — Telephone Encounter (Signed)
Please call [name], daughter n law. Needing to know if Ellieana should keeping taking stool softner, miralax and linzess.

## 2020-04-12 NOTE — Telephone Encounter (Signed)
Called patient's sister-in-law-Kathy and explained that patient is to stop taking the stool softener, to increase her water intake and take her Linzess and Miralax to help with her constipation. [name] agreed and understood. She had no further questions.

## 2020-04-13 LAB — H PYLORI, IGM, IGG, IGA AB
H pylori, IgM Abs: <9 units (ref 0.0–8.9)
H. pylori, IgA Abs: <9 units (ref 0.0–8.9)
H. pylori, IgG AbS: 0.13 Index Value (ref 0.00–0.79)

## 2020-04-17 ENCOUNTER — Other Ambulatory Visit: Payer: Self-pay | Admitting: Physician Assistant

## 2020-04-17 DIAGNOSIS — E782 Mixed hyperlipidemia: Secondary | ICD-10-CM

## 2020-04-17 DIAGNOSIS — K219 Gastro-esophageal reflux disease without esophagitis: Secondary | ICD-10-CM

## 2020-04-17 DIAGNOSIS — F411 Generalized anxiety disorder: Secondary | ICD-10-CM

## 2020-04-23 DIAGNOSIS — M5416 Radiculopathy, lumbar region: Secondary | ICD-10-CM | POA: Diagnosis not present

## 2020-04-23 DIAGNOSIS — M48062 Spinal stenosis, lumbar region with neurogenic claudication: Secondary | ICD-10-CM | POA: Diagnosis not present

## 2020-04-23 DIAGNOSIS — M5136 Other intervertebral disc degeneration, lumbar region: Secondary | ICD-10-CM | POA: Diagnosis not present

## 2020-05-03 NOTE — Progress Notes (Signed)
Subjective:   Helen Ramsey is a 86 y.o. female who presents for Medicare Annual (Subsequent) preventive examination.  I connected with Helen Ramsey today by telephone and verified that I am speaking with the correct person using two identifiers. Location patient: home Location provider: work Persons participating in the virtual visit: patient, provider.   I discussed the limitations, risks, security and privacy concerns of performing an evaluation and management service by telephone and the availability of in person appointments. I also discussed with the patient that there may be a patient responsible charge related to this service. The patient expressed understanding and verbally consented to this telephonic visit.    Interactive audio and video telecommunications were attempted between this provider and patient, however failed, due to patient having technical difficulties OR patient did not have access to video capability.  We continued and completed visit with audio only.  Review of Systems:  N/A  Cardiac Risk Factors include: advanced age (>20men, >58 women);dyslipidemia;hypertension     Objective:     Vitals: There were no vitals taken for this visit.  There is no height or weight on file to calculate BMI.  Advanced Directives 05/07/2020 09/28/2018 03/26/2017 03/31/2016 01/30/2016 01/30/2016 01/16/2016  Does Patient Have a Medical Advance Directive? Yes No Yes Yes Yes Yes Yes  Type of Paramedic of [place];Living will - [place];Living will [place];Living will Healthcare Power of [place]  Does patient want to make changes to medical advance directive? - - No - Patient declined - No - Patient declined No - Patient declined -  Copy of [place] in Chart? Yes - validated most recent copy scanned in chart (See row information) - No - copy  requested - No - copy requested No - copy requested -  Would patient like information on creating a medical advance directive? - No - Patient declined - - - - -    Tobacco Social History   Tobacco Use  Smoking Status Never Smoker  Smokeless Tobacco Never Used     Counseling given: Not Answered   Clinical Intake:  Pre-visit preparation completed: Yes  Pain : No/denies pain     Nutritional Risks: None Diabetes: No  How often do you need to have someone help you when you read instructions, pamphlets, or other written materials from your doctor or pharmacy?: 1 - Never  Interpreter Needed?: No  Information entered by :: [facility] : [facility], LPN  Past Medical History:  Diagnosis Date  . Allergic rhinitis, cause unspecified   . Allergic rhinitis, cause unspecified   . Atherosclerosis of aorta ([place])   . Back pain    lumbar  . Degeneration of lumbar or lumbosacral intervertebral disc   . Diverticulitis of colon (without mention of hemorrhage)(562.11)   . Dizziness and giddiness   . Dysthymic disorder   . Headache(784.0)   . Myalgia and myositis, unspecified   . Osteoporosis, unspecified   . Other and unspecified hyperlipidemia   . Other diseases of nasal cavity and sinuses(478.19)   . Other specified disorders of bladder   . Syncope   . Trigger finger (acquired)   . Unspecified essential hypertension   . Unspecified sleep apnea   . Urinary frequency    Past Surgical History:  Procedure Laterality Date  . ABDOMINAL HYSTERECTOMY    . ADENOIDECTOMY    . APPENDECTOMY    . ARTHROSCOPIC KNEE SURGERY    .  BREAST BIOPSY Right    lymph node surgery, benign   . CARDIAC CATHETERIZATION     [facility]  . CARPAL TUNNEL RELEASE    . CATARACT EXTRACTION W/ INTRAOCULAR LENS IMPLANT & ANTERIOR VITRECTOMY, BILATERAL    . CHOLECYSTECTOMY    . KNEE ARTHROPLASTY Right 01/30/2016   Procedure: COMPUTER ASSISTED TOTAL KNEE ARTHROPLASTY;  Surgeon: Dereck Leep, MD;  Location: [facility] ORS;  Service:  Orthopedics;  Laterality: Right;  . R/O Lymph Nodes; Right Axilla    . ROTATOR CUFF REPAIR Right   . SPG block  03/20/2016   sphenopalatine ganglion and maxillary division of trigeminal nerve block block Dr. Manuella Ghazi  . TEMPORAL ARTERY BIOPSY / LIGATION    . TONSILLECTOMY    . TUBAL LIGATION     Family History  Problem Relation Age of Onset  . Heart disease Mother   . Depression Mother   . Colon polyps Mother   . Dementia Mother   . Heart disease Father   . Dementia Father   . Heart disease Brother   . Hypertension Brother   . Heart disease Brother   . Hypertension Brother   . Kidney cancer Neg Hx   . Bladder Cancer Neg Hx    Social History   Socioeconomic History  . Marital status: Widowed    Spouse name: Not on file  . Number of children: 3  . Years of education: Not on file  . Highest education level: Some college, no degree  Occupational History  . Occupation: retired  Tobacco Use  . Smoking status: Never Smoker  . Smokeless tobacco: Never Used  Vaping Use  . Vaping Use: Never used  Substance and Sexual Activity  . Alcohol use: No  . Drug use: No  . Sexual activity: Not on file  Other Topics Concern  . Not on file  Social History Narrative  . Not on file   Social Determinants of Health   Financial Resource Strain: Low Risk   . Difficulty of Paying Living Expenses: Not hard at all  Food Insecurity: No Food Insecurity  . Worried About Charity fundraiser in the Last Year: Never true  . Ran Out of Food in the Last Year: Never true  Transportation Needs: No Transportation Needs  . Lack of Transportation (Medical): No  . Lack of Transportation (Non-Medical): No  Physical Activity: Inactive  . Days of Exercise per Week: 0 days  . Minutes of Exercise per Session: 0 min  Stress: No Stress Concern Present  . Feeling of Stress : Not at all  Social Connections: Moderately Isolated  . Frequency of Communication with Friends and Family: More than three times a week    . Frequency of Social Gatherings with Friends and Family: Three times a week  . Attends Religious Services: More than 4 times per year  . Active Member of Clubs or Organizations: No  . Attends Archivist Meetings: Never  . Marital Status: Widowed    Outpatient Encounter Medications as of 05/07/2020  Medication Sig  . acetaminophen (TYLENOL) 500 MG tablet Take 500 mg by mouth daily.   Marland Kitchen ALPRAZolam (XANAX) 0.25 MG tablet TAKE ONE TABLET BY MOUTH TWO TIMES A DAY AS NEEDED FOR ANXIETY  . aspirin EC 81 MG tablet Take 81 mg by mouth daily.  Marland Kitchen atorvastatin (LIPITOR) 20 MG tablet TAKE 1 TABLET BY MOUTH EVERY DAY AT 6:00 PM  . azelastine (ASTELIN) 0.1 % nasal spray USE 1 SPRAY IN EACH NOSTRIL  TWICE DAILY AS DIRECTED  . azithromycin (ZITHROMAX) 250 MG tablet Take 250 mg by mouth daily. 2 prior any procedure  . Calcium Carbonate-Vitamin D 600-400 MG-UNIT tablet Take 1 tablet by mouth 2 (two) times daily.  . cetirizine (ZYRTEC) 10 MG tablet Take 10 mg daily by mouth.  . citalopram (CELEXA) 40 MG tablet TAKE 1 TABLET BY MOUTH EVERY DAY  . cyanocobalamin 1000 MCG tablet Take 1,000 mcg by mouth daily.  . Dentifrices (FLUORIDE TOOTHPASTE DT) Place onto teeth daily.   Marland Kitchen EMGALITY 120 MG/ML SOAJ Inject 1 Dose as directed every 30 (thirty) days.   . famotidine (PEPCID) 20 MG tablet TAKE 1 TABLET BY MOUTH DAILY AS NEEDED FOR HEARTBURN OR INDIGESTION  . furosemide (LASIX) 20 MG tablet Take 1 tablet (20 mg total) by mouth daily. May take extra tab if weight up greater 3 pounds  . gabapentin (NEURONTIN) 100 MG capsule Start Gabapentin 100 mg (one tablet) in the morning, 100 mg in the afternoon (one tablet), and 200 mg (two tablets) in the evening.  . linaclotide (LINZESS) 145 MCG CAPS capsule Take 1 capsule (145 mcg total) by mouth daily.  Marland Kitchen lisinopril (ZESTRIL) 5 MG tablet TAKE 1 TABLET BY MOUTH DAILY  . Magnesium Hydroxide (MILK OF MAGNESIA PO) Take by mouth as needed.  . Magnesium Oxide, Antacid,  500 MG CAPS Take 1 tablet by mouth as needed.  . Melatonin 1 MG TABS Take 1 tablet by mouth as needed.   . Omega-3 Fatty Acids (FISH OIL) 1200 MG CAPS Take 2 capsules by mouth daily.   Marland Kitchen omeprazole (PRILOSEC) 20 MG capsule TAKE 1 CAPSULE(20 MG) BY MOUTH DAILY  . phenazopyridine (AZO-TABS) 95 MG tablet Take 1 tablet (95 mg total) by mouth as needed.  . polyethylene glycol (MIRALAX) 17 g packet Take 17 g by mouth daily.  . potassium gluconate 595 (99 K) MG TABS tablet Take 1 tablet (595 mg total) by mouth 2 (two) times daily.  . Simethicone (GAS RELIEF PO) Take by mouth as needed.   . sodium chloride (OCEAN) 0.65 % nasal spray Place 1 spray into the nose as needed.   . traMADol (ULTRAM) 50 MG tablet TAKE ONE TABLET BY MOUTH TWO TIMES A DAY AS NEEDED  . docusate sodium (COLACE) 100 MG capsule Take 300 mg by mouth 2 (two) times daily as needed for mild constipation.  (Patient not taking: Reported on 05/07/2020)  . fluticasone (FLONASE SENSIMIST) 27.5 MCG/SPRAY nasal spray Place 2 sprays into the nose daily. (Patient not taking: Reported on 05/07/2020)  . Plecanatide (TRULANCE) 3 MG TABS Take 3 mg by mouth daily. (Patient not taking: Reported on 05/07/2020)   No facility-administered encounter medications on file as of 05/07/2020.    Activities of Daily Living In your present state of health, do you have any difficulty performing the following activities: 05/07/2020  Hearing? N  Vision? N  Comment Wears eye glasses.  Difficulty concentrating or making decisions? N  Walking or climbing stairs? Y  Comment Due to SOB.  Dressing or bathing? N  Doing errands, shopping? Y  Comment Does not drive.  Preparing Food and eating ? N  Using the Toilet? N  In the past six months, have you accidently leaked urine? Y  Comment Wears protection daily.  Do you have problems with loss of bowel control? N  Managing your Medications? N  Managing your Finances? N  Housekeeping or managing your Housekeeping? N   Some recent data might be hidden  Patient Care Team: Rubye Beach as PCP - General (Family Medicine) Vladimir Crofts, MD as Consulting Physician (Neurology) Sharlet Salina, MD as Referring Physician (Physical Medicine and Rehabilitation) Dingeldein, Remo Lipps, MD (Ophthalmology) Clyde Canterbury, MD as Referring Physician (Otolaryngology) Virgel Manifold, MD as Consulting Physician (Gastroenterology) Meeler, Sherren Kerns, FNP (Family Medicine)    Assessment:   This is a routine wellness examination for Dorann.  Exercise Activities and Dietary recommendations Current Exercise Habits: The patient does not participate in regular exercise at present, Exercise limited by: orthopedic condition(s)  Goals    . Exercise 3x per week (30 min per time)     Recommend to exercise for 3 days a week for at least 30 minutes at a time.        Fall Risk: Fall Risk  05/07/2020 05/04/2019 10/05/2018 04/02/2017 03/31/2016  Falls in the past year? 0 0 1 Yes Yes  Comment - - Emmi Telephone Survey: data to providers prior to load - -  Number falls in past yr: 0 - 1 2 or more 2 or more  Comment - - Emmi Telephone Survey Actual Response = 1 - -  Injury with Fall? 0 - 0 No No    FALL RISK PREVENTION PERTAINING TO THE HOME:  Any stairs in or around the home? No  If so, are there any without handrails? N/A  Home free of loose throw rugs in walkways, pet beds, electrical cords, etc? Yes  Adequate lighting in your home to reduce risk of falls? Yes   ASSISTIVE DEVICES UTILIZED TO PREVENT FALLS:  Life alert? No  Use of a cane, walker or w/c? Yes  Grab bars in the bathroom? Yes  Shower chair or bench in shower? Yes  Elevated toilet seat or a handicapped toilet? Yes    TIMED UP AND GO:  Was the test performed? No .    Depression Screen PHQ 2/9 Scores 05/07/2020 12/27/2018 07/28/2017 04/02/2017  PHQ - 2 Score 1 - 2 0  PHQ- 9 Score - - 8 3  Exception Documentation - Medical reason - -   Not completed - - - -     Cognitive Function     6CIT Screen 05/07/2020  What Year? 0 points  What month? 0 points  What time? 0 points  Count back from 20 0 points  Months in reverse 0 points  Repeat phrase 2 points  Total Score 2    Immunization History  Administered Date(s) Administered  . Fluad Quad(high Dose 65+) 08/05/2019  . Influenza, High Dose Seasonal PF 08/29/2015, 09/01/2016, 07/28/2017, 08/16/2018  . PFIZER SARS-COV-2 Vaccination 12/08/2019, 12/29/2019  . Pneumococcal Conjugate-13 08/29/2015  . Pneumococcal Polysaccharide-23 07/28/2017    Qualifies for Shingles Vaccine? Yes . Due for Shingrix. Pt has been advised to call insurance company to determine out of pocket expense. Advised may also receive vaccine at local pharmacy or Health Dept. Verbalized acceptance and understanding.  Tdap: Although this vaccine is not a covered service during a Wellness Exam, does the patient still wish to receive this vaccine today?  No . Advised may receive this vaccine at local pharmacy or Health Dept. Aware to provide a copy of the vaccination record if obtained from local pharmacy or Health Dept. Verbalized acceptance and understanding.  Flu Vaccine: Up to date  Pneumococcal Vaccine: Completed series  Screening Tests Health Maintenance  Topic Date Due  . TETANUS/TDAP  05/07/2021 (Originally 07/09/1952)  . INFLUENZA VACCINE  06/17/2020  . DEXA SCAN  01/22/2022  .  COVID-19 Vaccine  Completed  . PNA vac Low Risk Adult  Completed    Cancer Screenings:  Colorectal Screening: No longer required.   Mammogram: No longer required.   Bone Density: Completed 01/23/20. Results reflect OSTEOPOROSIS. Repeat every 2 years.   Lung Cancer Screening: (Low Dose CT Chest recommended if Age 8-80 years, 30 pack-year currently smoking OR have quit w/in 15years.) does not qualify.   Additional Screening:  Vision Screening: Recommended annual ophthalmology exams for early detection of  glaucoma and other disorders of the eye.  Dental Screening: Recommended annual dental exams for proper oral hygiene  Community Resource Referral:  CRR required this visit? No     Plan:  I have personally reviewed and addressed the Medicare Annual Wellness questionnaire and have noted the following in the patient's chart:  A. Medical and social history B. Use of alcohol, tobacco or illicit drugs  C. Current medications and supplements D. Functional ability and status E.  Nutritional status F.  Physical activity G. Advance directives H. List of other physicians I.  Hospitalizations, surgeries, and ER visits in previous 12 months J.  [place] such as hearing and vision if needed, cognitive and depression L. Referrals and appointments   In addition, I have reviewed and discussed with patient certain preventive protocols, quality metrics, and best practice recommendations. A written personalized care plan for preventive services as well as general preventive health recommendations were provided to patient. Nurse Health Advisor  Signed,    Dhyan Noah [place], [place]  5/32/0233 Nurse Health Advisor   Nurse Notes: None.

## 2020-05-07 ENCOUNTER — Other Ambulatory Visit: Payer: Self-pay

## 2020-05-07 ENCOUNTER — Ambulatory Visit (INDEPENDENT_AMBULATORY_CARE_PROVIDER_SITE_OTHER): Payer: Medicare Other

## 2020-05-07 DIAGNOSIS — Z0000 Encounter for general adult medical examination without abnormal findings: Secondary | ICD-10-CM | POA: Diagnosis not present

## 2020-05-07 NOTE — Patient Instructions (Signed)
Helen Ramsey , Thank you for taking time to come for your Medicare Wellness Visit. I appreciate your ongoing commitment to your health goals. Please review the following plan we discussed and let me know if I can assist you in the future.   Screening recommendations/referrals: Colonoscopy: No longer required.  Mammogram: No longer required.  Bone Density: Up to date, due 01/2022 Recommended yearly ophthalmology/optometry visit for glaucoma screening and checkup Recommended yearly dental visit for hygiene and checkup  Vaccinations: Influenza vaccine: Completed 08/05/19 Pneumococcal vaccine: Completed series Tdap vaccine: Currently due. Pt declines today.  Shingles vaccine: Currently due. Pt declines today.   Advanced directives: Currently on file.  Conditions/risks identified: Recommend to exercise for 3 days a week for at least 30 minutes at a time.   Next appointment: 06/11/20 @ 9:00 AM with [place] 65 Years and Older, Female Preventive care refers to lifestyle choices and visits with your health care provider that can promote health and wellness. What does preventive care include?  A yearly physical exam. This is also called an annual well check.  Dental exams once or twice a year.  Routine eye exams. Ask your health care provider how often you should have your eyes checked.  Personal lifestyle choices, including:  Daily care of your teeth and gums.  Regular physical activity.  Eating a healthy diet.  Avoiding tobacco and drug use.  Limiting alcohol use.  Practicing safe sex.  Taking low-dose aspirin every day.  Taking vitamin and mineral supplements as recommended by your health care provider. What happens during an annual well check? The services and screenings done by your health care provider during your annual well check will depend on your age, overall health, lifestyle risk factors, and family history of disease. Counseling  Your  health care provider may ask you questions about your:  Alcohol use.  Tobacco use.  Drug use.  Emotional well-being.  Home and relationship well-being.  Sexual activity.  Eating habits.  History of falls.  Memory and ability to understand (cognition).  Work and work Statistician.  Reproductive health. Screening  You may have the following tests or measurements:  Height, weight, and BMI.  Blood pressure.  Lipid and cholesterol levels. These may be checked every 5 years, or more frequently if you are over 63 years old.  Skin check.  Lung cancer screening. You may have this screening every year starting at age 62 if you have a 30-pack-year history of smoking and currently smoke or have quit within the past 15 years.  Fecal occult blood test (FOBT) of the stool. You may have this test every year starting at age 73.  Flexible sigmoidoscopy or colonoscopy. You may have a sigmoidoscopy every 5 years or a colonoscopy every 10 years starting at age 62.  Hepatitis C blood test.  Hepatitis B blood test.  Sexually transmitted disease (STD) testing.  Diabetes screening. This is done by checking your blood sugar (glucose) after you have not eaten for a while (fasting). You may have this done every 1-3 years.  Bone density scan. This is done to screen for osteoporosis. You may have this done starting at age 55.  Mammogram. This may be done every 1-2 years. Talk to your health care provider about how often you should have regular mammograms. Talk with your health care provider about your test results, treatment options, and if necessary, the need for more tests. Vaccines  Your health care provider may recommend certain vaccines,  such as:  Influenza vaccine. This is recommended every year.  Tetanus, diphtheria, and acellular pertussis (Tdap, Td) vaccine. You may need a Td booster every 10 years.  Zoster vaccine. You may need this after age 37.  Pneumococcal 13-valent  conjugate (PCV13) vaccine. One dose is recommended after age 15.  Pneumococcal polysaccharide (PPSV23) vaccine. One dose is recommended after age 62. Talk to your health care provider about which screenings and vaccines you need and how often you need them. This information is not intended to replace advice given to you by your health care provider. Make sure you discuss any questions you have with your health care provider. Document Released: 11/30/2015 Document Revised: 07/23/2016 Document Reviewed: 09/04/2015 Elsevier Interactive Patient Education  2017 [place] Prevention in the Home Falls can cause injuries. They can happen to people of all ages. There are many things you can do to make your home safe and to help prevent falls. What can I do on the outside of my home?  Regularly fix the edges of walkways and driveways and fix any cracks.  Remove anything that might make you trip as you walk through a door, such as a raised step or threshold.  Trim any bushes or trees on the path to your home.  Use bright outdoor lighting.  Clear any walking paths of anything that might make someone trip, such as rocks or tools.  Regularly check to see if handrails are loose or broken. Make sure that both sides of any steps have handrails.  Any raised decks and porches should have guardrails on the edges.  Have any leaves, snow, or ice cleared regularly.  Use sand or salt on walking paths during winter.  Clean up any spills in your garage right away. This includes oil or grease spills. What can I do in the bathroom?  Use night lights.  Install grab bars by the toilet and in the tub and shower. Do not use towel bars as grab bars.  Use non-skid mats or decals in the tub or shower.  If you need to sit down in the shower, use a plastic, non-slip stool.  Keep the floor dry. Clean up any water that spills on the floor as soon as it happens.  Remove soap buildup in the tub or  shower regularly.  Attach bath mats securely with double-sided non-slip rug tape.  Do not have throw rugs and other things on the floor that can make you trip. What can I do in the bedroom?  Use night lights.  Make sure that you have a light by your bed that is easy to reach.  Do not use any sheets or blankets that are too big for your bed. They should not hang down onto the floor.  Have a firm chair that has side arms. You can use this for support while you get dressed.  Do not have throw rugs and other things on the floor that can make you trip. What can I do in the kitchen?  Clean up any spills right away.  Avoid walking on wet floors.  Keep items that you use a lot in easy-to-reach places.  If you need to reach something above you, use a strong step stool that has a grab bar.  Keep electrical cords out of the way.  Do not use floor polish or wax that makes floors slippery. If you must use wax, use non-skid floor wax.  Do not have throw rugs and other things on  the floor that can make you trip. What can I do with my stairs?  Do not leave any items on the stairs.  Make sure that there are handrails on both sides of the stairs and use them. Fix handrails that are broken or loose. Make sure that handrails are as long as the stairways.  Check any carpeting to make sure that it is firmly attached to the stairs. Fix any carpet that is loose or worn.  Avoid having throw rugs at the top or bottom of the stairs. If you do have throw rugs, attach them to the floor with carpet tape.  Make sure that you have a light switch at the top of the stairs and the bottom of the stairs. If you do not have them, ask someone to add them for you. What else can I do to help prevent falls?  Wear shoes that:  Do not have high heels.  Have rubber bottoms.  Are comfortable and fit you well.  Are closed at the toe. Do not wear sandals.  If you use a stepladder:  Make sure that it is fully  opened. Do not climb a closed stepladder.  Make sure that both sides of the stepladder are locked into place.  Ask someone to hold it for you, if possible.  Clearly mark and make sure that you can see:  Any grab bars or handrails.  First and last steps.  Where the edge of each step is.  Use tools that help you move around (mobility aids) if they are needed. These include:  Canes.  Walkers.  Scooters.  Crutches.  Turn on the lights when you go into a dark area. Replace any light bulbs as soon as they burn out.  Set up your furniture so you have a clear path. Avoid moving your furniture around.  If any of your floors are uneven, fix them.  If there are any pets around you, be aware of where they are.  Review your medicines with your doctor. Some medicines can make you feel dizzy. This can increase your chance of falling. Ask your doctor what other things that you can do to help prevent falls. This information is not intended to replace advice given to you by your health care provider. Make sure you discuss any questions you have with your health care provider. Document Released: 08/30/2009 Document Revised: 04/10/2016 Document Reviewed: 12/08/2014 Elsevier Interactive Patient Education  2017 [employer].

## 2020-05-18 DIAGNOSIS — Z8673 Personal history of transient ischemic attack (TIA), and cerebral infarction without residual deficits: Secondary | ICD-10-CM | POA: Diagnosis not present

## 2020-05-18 DIAGNOSIS — Z86010 Personal history of colonic polyps: Secondary | ICD-10-CM | POA: Diagnosis not present

## 2020-05-18 DIAGNOSIS — M5117 Intervertebral disc disorders with radiculopathy, lumbosacral region: Secondary | ICD-10-CM | POA: Diagnosis not present

## 2020-05-18 DIAGNOSIS — M5136 Other intervertebral disc degeneration, lumbar region: Secondary | ICD-10-CM | POA: Diagnosis not present

## 2020-05-18 DIAGNOSIS — F329 Major depressive disorder, single episode, unspecified: Secondary | ICD-10-CM | POA: Diagnosis not present

## 2020-05-18 DIAGNOSIS — G2581 Restless legs syndrome: Secondary | ICD-10-CM | POA: Diagnosis not present

## 2020-05-18 DIAGNOSIS — K581 Irritable bowel syndrome with constipation: Secondary | ICD-10-CM | POA: Diagnosis not present

## 2020-05-18 DIAGNOSIS — F419 Anxiety disorder, unspecified: Secondary | ICD-10-CM | POA: Diagnosis not present

## 2020-05-18 DIAGNOSIS — H269 Unspecified cataract: Secondary | ICD-10-CM | POA: Diagnosis not present

## 2020-05-18 DIAGNOSIS — M1990 Unspecified osteoarthritis, unspecified site: Secondary | ICD-10-CM | POA: Diagnosis not present

## 2020-05-18 DIAGNOSIS — G4730 Sleep apnea, unspecified: Secondary | ICD-10-CM | POA: Diagnosis not present

## 2020-05-18 DIAGNOSIS — G629 Polyneuropathy, unspecified: Secondary | ICD-10-CM | POA: Diagnosis not present

## 2020-05-18 DIAGNOSIS — R32 Unspecified urinary incontinence: Secondary | ICD-10-CM | POA: Diagnosis not present

## 2020-05-18 DIAGNOSIS — M48062 Spinal stenosis, lumbar region with neurogenic claudication: Secondary | ICD-10-CM | POA: Diagnosis not present

## 2020-05-18 DIAGNOSIS — I10 Essential (primary) hypertension: Secondary | ICD-10-CM | POA: Diagnosis not present

## 2020-05-18 DIAGNOSIS — E785 Hyperlipidemia, unspecified: Secondary | ICD-10-CM | POA: Diagnosis not present

## 2020-05-18 DIAGNOSIS — K219 Gastro-esophageal reflux disease without esophagitis: Secondary | ICD-10-CM | POA: Diagnosis not present

## 2020-05-18 DIAGNOSIS — Z90710 Acquired absence of both cervix and uterus: Secondary | ICD-10-CM | POA: Diagnosis not present

## 2020-05-18 DIAGNOSIS — M4727 Other spondylosis with radiculopathy, lumbosacral region: Secondary | ICD-10-CM | POA: Diagnosis not present

## 2020-05-18 DIAGNOSIS — Z9049 Acquired absence of other specified parts of digestive tract: Secondary | ICD-10-CM | POA: Diagnosis not present

## 2020-05-18 DIAGNOSIS — Z9089 Acquired absence of other organs: Secondary | ICD-10-CM | POA: Diagnosis not present

## 2020-05-23 DIAGNOSIS — M5117 Intervertebral disc disorders with radiculopathy, lumbosacral region: Secondary | ICD-10-CM | POA: Diagnosis not present

## 2020-05-23 DIAGNOSIS — M4727 Other spondylosis with radiculopathy, lumbosacral region: Secondary | ICD-10-CM | POA: Diagnosis not present

## 2020-05-23 DIAGNOSIS — M48062 Spinal stenosis, lumbar region with neurogenic claudication: Secondary | ICD-10-CM | POA: Diagnosis not present

## 2020-05-23 DIAGNOSIS — M1990 Unspecified osteoarthritis, unspecified site: Secondary | ICD-10-CM | POA: Diagnosis not present

## 2020-05-23 DIAGNOSIS — M5136 Other intervertebral disc degeneration, lumbar region: Secondary | ICD-10-CM | POA: Diagnosis not present

## 2020-05-23 DIAGNOSIS — F329 Major depressive disorder, single episode, unspecified: Secondary | ICD-10-CM | POA: Diagnosis not present

## 2020-05-25 DIAGNOSIS — F329 Major depressive disorder, single episode, unspecified: Secondary | ICD-10-CM | POA: Diagnosis not present

## 2020-05-25 DIAGNOSIS — M5136 Other intervertebral disc degeneration, lumbar region: Secondary | ICD-10-CM | POA: Diagnosis not present

## 2020-05-25 DIAGNOSIS — M1990 Unspecified osteoarthritis, unspecified site: Secondary | ICD-10-CM | POA: Diagnosis not present

## 2020-05-25 DIAGNOSIS — M48062 Spinal stenosis, lumbar region with neurogenic claudication: Secondary | ICD-10-CM | POA: Diagnosis not present

## 2020-05-25 DIAGNOSIS — M5117 Intervertebral disc disorders with radiculopathy, lumbosacral region: Secondary | ICD-10-CM | POA: Diagnosis not present

## 2020-05-25 DIAGNOSIS — M4727 Other spondylosis with radiculopathy, lumbosacral region: Secondary | ICD-10-CM | POA: Diagnosis not present

## 2020-05-29 DIAGNOSIS — M48062 Spinal stenosis, lumbar region with neurogenic claudication: Secondary | ICD-10-CM | POA: Diagnosis not present

## 2020-05-29 DIAGNOSIS — M5136 Other intervertebral disc degeneration, lumbar region: Secondary | ICD-10-CM | POA: Diagnosis not present

## 2020-05-29 DIAGNOSIS — M5117 Intervertebral disc disorders with radiculopathy, lumbosacral region: Secondary | ICD-10-CM | POA: Diagnosis not present

## 2020-05-29 DIAGNOSIS — M1990 Unspecified osteoarthritis, unspecified site: Secondary | ICD-10-CM | POA: Diagnosis not present

## 2020-05-29 DIAGNOSIS — F329 Major depressive disorder, single episode, unspecified: Secondary | ICD-10-CM | POA: Diagnosis not present

## 2020-05-29 DIAGNOSIS — M4727 Other spondylosis with radiculopathy, lumbosacral region: Secondary | ICD-10-CM | POA: Diagnosis not present

## 2020-06-06 DIAGNOSIS — M48062 Spinal stenosis, lumbar region with neurogenic claudication: Secondary | ICD-10-CM | POA: Diagnosis not present

## 2020-06-06 DIAGNOSIS — M4727 Other spondylosis with radiculopathy, lumbosacral region: Secondary | ICD-10-CM | POA: Diagnosis not present

## 2020-06-06 DIAGNOSIS — M1990 Unspecified osteoarthritis, unspecified site: Secondary | ICD-10-CM | POA: Diagnosis not present

## 2020-06-06 DIAGNOSIS — M5136 Other intervertebral disc degeneration, lumbar region: Secondary | ICD-10-CM | POA: Diagnosis not present

## 2020-06-06 DIAGNOSIS — M5117 Intervertebral disc disorders with radiculopathy, lumbosacral region: Secondary | ICD-10-CM | POA: Diagnosis not present

## 2020-06-06 DIAGNOSIS — F329 Major depressive disorder, single episode, unspecified: Secondary | ICD-10-CM | POA: Diagnosis not present

## 2020-06-11 ENCOUNTER — Encounter: Payer: Self-pay | Admitting: Physician Assistant

## 2020-06-11 ENCOUNTER — Other Ambulatory Visit: Payer: Self-pay

## 2020-06-11 ENCOUNTER — Ambulatory Visit (INDEPENDENT_AMBULATORY_CARE_PROVIDER_SITE_OTHER): Payer: Medicare Other | Admitting: Physician Assistant

## 2020-06-11 VITALS — BP 113/66 | HR 73 | Temp 97.1°F | Resp 16 | Wt 173.2 lb

## 2020-06-11 DIAGNOSIS — J3489 Other specified disorders of nose and nasal sinuses: Secondary | ICD-10-CM

## 2020-06-11 DIAGNOSIS — M5481 Occipital neuralgia: Secondary | ICD-10-CM

## 2020-06-11 DIAGNOSIS — F419 Anxiety disorder, unspecified: Secondary | ICD-10-CM | POA: Diagnosis not present

## 2020-06-11 DIAGNOSIS — M5136 Other intervertebral disc degeneration, lumbar region: Secondary | ICD-10-CM

## 2020-06-11 DIAGNOSIS — M48062 Spinal stenosis, lumbar region with neurogenic claudication: Secondary | ICD-10-CM | POA: Diagnosis not present

## 2020-06-11 DIAGNOSIS — K5904 Chronic idiopathic constipation: Secondary | ICD-10-CM | POA: Diagnosis not present

## 2020-06-11 DIAGNOSIS — M7918 Myalgia, other site: Secondary | ICD-10-CM

## 2020-06-11 DIAGNOSIS — R443 Hallucinations, unspecified: Secondary | ICD-10-CM

## 2020-06-11 DIAGNOSIS — M1990 Unspecified osteoarthritis, unspecified site: Secondary | ICD-10-CM | POA: Diagnosis not present

## 2020-06-11 DIAGNOSIS — J320 Chronic maxillary sinusitis: Secondary | ICD-10-CM

## 2020-06-11 DIAGNOSIS — F329 Major depressive disorder, single episode, unspecified: Secondary | ICD-10-CM

## 2020-06-11 DIAGNOSIS — F32A Depression, unspecified: Secondary | ICD-10-CM

## 2020-06-11 DIAGNOSIS — I10 Essential (primary) hypertension: Secondary | ICD-10-CM

## 2020-06-11 DIAGNOSIS — M4727 Other spondylosis with radiculopathy, lumbosacral region: Secondary | ICD-10-CM | POA: Diagnosis not present

## 2020-06-11 DIAGNOSIS — M5117 Intervertebral disc disorders with radiculopathy, lumbosacral region: Secondary | ICD-10-CM | POA: Diagnosis not present

## 2020-06-11 NOTE — Progress Notes (Signed)
Established patient visit   Patient: Helen Ramsey   DOB: June 23, 1933   86 y.o. Female  MRN: [id number] Visit Date: 06/11/2020  Today's healthcare provider: Mar Daring, PA-C   Chief Complaint  Patient presents with  . Follow-up   Subjective    HPI   Patient had her AWV with [facility] on 05-07-20   Follow up for Essential Hypertension  The patient was last seen for this 3 months ago. Changes made at last visit include stable.continue current medication plan.  She reports excellent compliance with treatment. BP reading 120's-140's/60's most of the time and occasionally in the 50's.  ----------------------------------------------------------------------------------------- Chronic Constipation: patient here for 3 months f/u. Changes made at last visit include referral placed to GI and Trulance 3mg . If not effective may consider Linzess 225mcg. Trulance didn't work and she is on the [employer] and she is going to the bathroom more regularly. She saw GI and started taking the Linzess 126mcg 04/11/20.She also takes Miralax 1/2 cap each day.   Depression: 3 month f/u. Stable on Citalopram 40mg  and Alprazolam 0.25mg  BID prn.  Hypercholesterolemia: Stable. She is currently on Atorvastatin 20mg .    She has had back injections. 2 in her lower back and 1 on the occipal nerve. She is scheduled to see Dr. Manuella Ghazi at the beginning of the month for her next injection for her headaches. Reports she has completed PT for Dr. Sharlet Salina and will be seeing him back to see if she requires a third lumbar ESI.   Also reports of recent she has had dreams that were realistic involving spiders. Always occur when she is in bed, with recent awakening or falling asleep. A while back she reports seeing a large one crawl under her dresser. She got up and looked and nothing was there. Then just last night she saw one in front of her face and she reached up and grabbed it. Pulled it down and when she moved her  hand nothing was there. Then she realized she was dreaming.    Patient Active Problem List   Diagnosis Date Noted  . Recurrent major depressive disorder, in partial remission ([place]) 02/27/2020  . Bilateral myofascial pain 06/01/2019  . Bilateral occipital neuralgia 06/01/2019  . Age-related osteoporosis without current pathological fracture 08/16/2018  . Gait instability 08/16/2018  . Blurred vision, right eye 08/16/2018  . Ocular pain, right eye 08/16/2018  . Dorsalgia 03/31/2016  . H/O total knee replacement 02/14/2016  . Systolic ejection murmur 39/76/7341  . Vitamin D deficiency 11/29/2015  . Allergy to other foods 11/29/2015  . Leg cramps 10/24/2015  . Cramp and spasm 10/24/2015  . Knee pain, bilateral 08/29/2015  . Sleep apnea 05/30/2015  . Restless legs syndrome 05/09/2015  . Allergic rhinitis 03/28/2015  . Anemia, unspecified 03/28/2015  . Anxiety and depression 03/28/2015  . Neck pain 03/28/2015  . Chronic headache 03/28/2015  . DDD (degenerative disc disease), lumbar 03/28/2015  . Gastro-esophageal reflux disease without esophagitis 03/28/2015  . 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 Essential (primary) hypertension 03/28/2015  . Adaptive colitis 03/28/2015  . OP (osteoporosis) 03/28/2015  . Nasal septal perforation 03/28/2015  . Spinal stenosis 03/28/2015  . Gastric ulcer without hemorrhage or perforation 03/28/2015  . Cerebral artery occlusion with cerebral infarction ([place]) 03/28/2015  . Chronic sinusitis 03/28/2015  . Connective tissue and disc stenosis of intervertebral foramina of abdomen and other regions 03/28/2015  . Diverticulosis of large intestine without perforation or abscess without bleeding 03/28/2015  .  Osteoarthritis 03/28/2015  . Other specified functional intestinal disorders 03/28/2015  . Hardening of the aorta (main artery of the heart) ([place]) 07/13/2014  . H/O adenomatous polyp of colon 06/27/2005   Past Medical  History:  Diagnosis Date  . Allergic rhinitis, cause unspecified   . Allergic rhinitis, cause unspecified   . Atherosclerosis of aorta ([place])   . Back pain    lumbar  . Degeneration of lumbar or lumbosacral intervertebral disc   . Diverticulitis of colon (without mention of hemorrhage)(562.11)   . Dizziness and giddiness   . Dysthymic disorder   . Headache(784.0)   . Myalgia and myositis, unspecified   . Osteoporosis, unspecified   . Other and unspecified hyperlipidemia   . Other diseases of nasal cavity and sinuses(478.19)   . Other specified disorders of bladder   . Syncope   . Trigger finger (acquired)   . Unspecified essential hypertension   . Unspecified sleep apnea   . Urinary frequency        Medications: Outpatient Medications Prior to Visit  Medication Sig  . acetaminophen (TYLENOL) 500 MG tablet Take 500 mg by mouth daily.   Marland Kitchen ALPRAZolam (XANAX) 0.25 MG tablet TAKE ONE TABLET BY MOUTH TWO TIMES A DAY AS NEEDED FOR ANXIETY  . aspirin EC 81 MG tablet Take 81 mg by mouth daily.  Marland Kitchen atorvastatin (LIPITOR) 20 MG tablet TAKE 1 TABLET BY MOUTH EVERY DAY AT 6:00 PM  . azelastine (ASTELIN) 0.1 % nasal spray USE 1 SPRAY IN EACH NOSTRIL TWICE DAILY AS DIRECTED  . Calcium Carbonate-Vitamin D 600-400 MG-UNIT tablet Take 1 tablet by mouth 2 (two) times daily.  . cetirizine (ZYRTEC) 10 MG tablet Take 10 mg daily by mouth.  . citalopram (CELEXA) 40 MG tablet TAKE 1 TABLET BY MOUTH EVERY DAY  . cyanocobalamin 1000 MCG tablet Take 1,000 mcg by mouth daily.  . Dentifrices (FLUORIDE TOOTHPASTE DT) Place onto teeth daily.   Marland Kitchen docusate sodium (COLACE) 100 MG capsule Take 300 mg by mouth 2 (two) times daily as needed for mild constipation.   Marland Kitchen EMGALITY 120 MG/ML SOAJ Inject 1 Dose as directed every 30 (thirty) days.   . famotidine (PEPCID) 20 MG tablet TAKE 1 TABLET BY MOUTH DAILY AS NEEDED FOR HEARTBURN OR INDIGESTION  . fluticasone (FLONASE SENSIMIST) 27.5 MCG/SPRAY nasal spray Place 2  sprays into the nose daily.  . furosemide (LASIX) 20 MG tablet Take 1 tablet (20 mg total) by mouth daily. May take extra tab if weight up greater 3 pounds  . gabapentin (NEURONTIN) 100 MG capsule Start Gabapentin 100 mg (one tablet) in the morning, 100 mg in the afternoon (one tablet), and 200 mg (two tablets) in the evening.  . linaclotide (LINZESS) 145 MCG CAPS capsule Take 1 capsule (145 mcg total) by mouth daily.  Marland Kitchen lisinopril (ZESTRIL) 5 MG tablet TAKE 1 TABLET BY MOUTH DAILY  . Magnesium Hydroxide (MILK OF MAGNESIA PO) Take by mouth as needed.  . Magnesium Oxide, Antacid, 500 MG CAPS Take 1 tablet by mouth as needed.  . Melatonin 1 MG TABS Take 1 tablet by mouth as needed.   . Omega-3 Fatty Acids (FISH OIL) 1200 MG CAPS Take 2 capsules by mouth daily.   Marland Kitchen omeprazole (PRILOSEC) 20 MG capsule TAKE 1 CAPSULE(20 MG) BY MOUTH DAILY  . phenazopyridine (AZO-TABS) 95 MG tablet Take 1 tablet (95 mg total) by mouth as needed.  . Polyethylene Glycol 3350 (MIRALAX PO) Take by mouth.  . potassium gluconate 595 (99  K) MG TABS tablet Take 1 tablet (595 mg total) by mouth 2 (two) times daily.  . Simethicone (GAS RELIEF PO) Take by mouth as needed.   . sodium chloride (OCEAN) 0.65 % nasal spray Place 1 spray into the nose as needed.   . traMADol (ULTRAM) 50 MG tablet TAKE ONE TABLET BY MOUTH TWO TIMES A DAY AS NEEDED  . [DISCONTINUED] azithromycin (ZITHROMAX) 250 MG tablet Take 250 mg by mouth daily. 2 prior any procedure  . [DISCONTINUED] Plecanatide (TRULANCE) 3 MG TABS Take 3 mg by mouth daily. (Patient not taking: Reported on 05/07/2020)   No facility-administered medications prior to visit.    Review of Systems  Constitutional: Negative.   HENT: Positive for congestion, sinus pressure and sneezing.   Eyes: Negative for visual disturbance.  Respiratory: Negative for chest tightness, shortness of breath and wheezing.   Cardiovascular: Negative for chest pain, palpitations and leg swelling.    Gastrointestinal: Positive for constipation (improving with medications) and diarrhea (only once; after going one day without a BM). Negative for abdominal distention and abdominal pain.  Musculoskeletal: Positive for back pain and gait problem.  Neurological: Positive for headaches ("Allergies").    Last CBC Lab Results  Component Value Date   WBC 4.3 02/27/2020   HGB 12.0 02/27/2020   HCT 34.8 02/27/2020   MCV 89 02/27/2020   MCH 30.7 02/27/2020   RDW 12.5 02/27/2020   PLT 218 03/70/4888   Last metabolic panel Lab Results  Component Value Date   GLUCOSE 98 02/27/2020   NA 140 02/27/2020   K 4.5 02/27/2020   CL 103 02/27/2020   CO2 21 02/27/2020   BUN 11 02/27/2020   CREATININE 0.86 02/27/2020   GFRNONAA 62 02/27/2020   GFRAA 72 02/27/2020   CALCIUM 9.6 02/27/2020   PROT 6.3 02/27/2020   ALBUMIN 4.2 02/27/2020   LABGLOB 2.1 02/27/2020   AGRATIO 2.0 02/27/2020   BILITOT 0.3 02/27/2020   ALKPHOS 61 02/27/2020   AST 21 02/27/2020   ALT 25 02/27/2020   ANIONGAP 7 09/28/2018      Objective    BP 113/66 (BP Location: Left Arm, Patient Position: Sitting, Cuff Size: Large)   Pulse 73   Temp (!) 97.1 F (36.2 C) (Temporal)   Resp 16   Wt 173 lb 3.2 oz (78.6 kg)   BMI 34.98 kg/m  BP Readings from Last 3 Encounters:  06/11/20 113/66  04/11/20 (!) 150/68  02/27/20 (!) 149/74   Wt Readings from Last 3 Encounters:  06/11/20 173 lb 3.2 oz (78.6 kg)  04/11/20 172 lb (78 kg)  02/27/20 171 lb (77.6 kg)      Physical Exam Vitals reviewed.  Constitutional:      General: She is not in acute distress.    Appearance: Normal appearance. She is well-developed. She is obese. She is not ill-appearing or diaphoretic.  Cardiovascular:     Rate and Rhythm: Normal rate and regular rhythm.     Heart sounds: Normal heart sounds. No murmur heard.  No friction rub. No gallop.   Pulmonary:     Effort: Pulmonary effort is normal. No respiratory distress.     Breath sounds:  Normal breath sounds. No wheezing or rales.  Musculoskeletal:     Cervical back: Normal range of motion and neck supple.  Neurological:     Mental Status: She is alert.  Psychiatric:        Mood and Affect: Mood normal.  Behavior: Behavior normal.        Thought Content: Thought content normal.       No results found for any visits on 06/11/20.  Assessment & Plan     1. Essential (primary) hypertension Stable. Continue Lisinopril 5mg  daily. Due for labs at next visit in 3 months.   2. DDD (degenerative disc disease), lumbar Improved currently following 2 ESI and PT. Has occasional episodes where she feels pain in the left buttocks, but not as severe as it had once been. Continue to follow up and continue current medical treatment plan per physical medicine.   3. Anxiety and depression Stable. Continue Citalopram 40mg  and Alprazolam 0.25mg  BID PRN.   4. Bilateral myofascial pain Continue gabapentin as prescribed.   5. Bilateral occipital neuralgia Continue treatment plan per Dr. Manuella Ghazi.   6. Chronic maxillary sinusitis Worsening. Failed Flonase, flonase sensimist. Mometasone became ineffective. Still using astelin without relief. Continue saline nasal rinses. Follow up with Dr. Richardson Landry, ENT.   7. Chronic idiopathic constipation Improved. Continue Linzess 131mcg and 1/2 cap of Miralax daily.   8. Nasal septal perforation See above medical treatment plan for # 6.   9. Hallucination New. Possible medication side effect. Concerned it may be related to alprazolam use. Patient will keep a journal of occurrences and will call to see if it appears to be related to any medications she may be currently taking.    Return in about 3 months (around 09/11/2020) for chronic follow ups.      Reynolds Bowl, PA-C, have reviewed all documentation for this visit. The documentation on 06/11/20 for the exam, diagnosis, procedures, and orders are all accurate and  complete.   Rubye Beach  [facility]  [phone] (phone) [phone] (fax)  [place]

## 2020-06-29 ENCOUNTER — Other Ambulatory Visit: Payer: Self-pay

## 2020-06-29 MED ORDER — LINACLOTIDE 145 MCG PO CAPS
145.0000 ug | ORAL_CAPSULE | Freq: Every day | ORAL | 1 refills | Status: DC
Start: 2020-06-29 — End: 2020-07-05

## 2020-07-03 ENCOUNTER — Other Ambulatory Visit: Payer: Self-pay | Admitting: Gastroenterology

## 2020-07-13 ENCOUNTER — Other Ambulatory Visit: Payer: Self-pay | Admitting: Physician Assistant

## 2020-07-13 DIAGNOSIS — E782 Mixed hyperlipidemia: Secondary | ICD-10-CM

## 2020-07-13 DIAGNOSIS — M1990 Unspecified osteoarthritis, unspecified site: Secondary | ICD-10-CM

## 2020-07-13 NOTE — Telephone Encounter (Signed)
Requested medication (s) are due for refill today: no  Requested medication (s) are on the active medication list: yes   Last refill:  04/29/2020  Future visit scheduled: yes   Notes to clinic:  this refill cannot be delegated    Requested Prescriptions  Pending Prescriptions Disp Refills   traMADol (ULTRAM) 50 MG tablet [Pharmacy Med Name: TRAMADOL 50MG  TABLETS] 180 tablet     Sig: TAKE 1 TABLET BY MOUTH TWICE DAILY AS NEEDED      Not Delegated - Analgesics:  Opioid Agonists Failed - 07/13/2020  9:32 AM      Failed - This refill cannot be delegated      Failed - Urine Drug Screen completed in last 360 days.      Passed - Valid encounter within last 6 months    Recent Outpatient Visits           1 month ago Essential (primary) hypertension   [place], [place], [place]   4 months ago Seasonal allergies   [place], [place]   8 months ago Chronic idiopathic constipation   [place], [place]   11 months ago Chronic idiopathic constipation   [place], [place]   1 year ago Chest pain, unspecified type   [facility], Clearnce Sorrel, PA-C       Future Appointments             In 2 months [place], Clearnce Sorrel, PA-C [employer], [place]   In 2 months Virgel Manifold, MD [place]

## 2020-07-15 ENCOUNTER — Other Ambulatory Visit: Payer: Self-pay | Admitting: Physician Assistant

## 2020-07-15 DIAGNOSIS — K219 Gastro-esophageal reflux disease without esophagitis: Secondary | ICD-10-CM

## 2020-07-15 NOTE — Telephone Encounter (Signed)
Requested Prescriptions  Pending Prescriptions Disp Refills  . famotidine (PEPCID) 20 MG tablet [Pharmacy Med Name: FAMOTIDINE 20MG  TABLETS] 90 tablet 1    Sig: TAKE 1 TABLET BY MOUTH DAILY AS NEEDED FOR HEARTBURN OR INDIGESTION     Gastroenterology:  H2 Antagonists Passed - 07/15/2020  6:21 AM      Passed - Valid encounter within last 12 months    Recent Outpatient Visits          1 month ago Essential (primary) hypertension   [place], [place], PA-C   4 months ago Seasonal allergies   [place], [place]   8 months ago Chronic idiopathic constipation   [place], [place]   11 months ago Chronic idiopathic constipation   [place], [place]   1 year ago Chest pain, unspecified type   [facility], Clearnce Sorrel, PA-C      Future Appointments            In 2 months [place], Clearnce Sorrel, PA-C [employer], [place]   In 2 months Virgel Manifold, MD [place]

## 2020-07-16 DIAGNOSIS — H16223 Keratoconjunctivitis sicca, not specified as Sjogren's, bilateral: Secondary | ICD-10-CM | POA: Diagnosis not present

## 2020-07-19 ENCOUNTER — Ambulatory Visit: Payer: Medicare Other | Admitting: Gastroenterology

## 2020-08-03 ENCOUNTER — Other Ambulatory Visit: Payer: Self-pay

## 2020-08-03 DIAGNOSIS — F411 Generalized anxiety disorder: Secondary | ICD-10-CM

## 2020-08-03 NOTE — Telephone Encounter (Signed)
Copied from [place] [id number]. Topic: General - Other >> Aug 03, 2020 10:07 AM Rainey Pines A wrote: Patient wanted to get message to [facility] nurse about her anxiety medication. Patient stated that she takes the medication as needed, however the bottle has expired and she is wanting  to know if a 30 day supply of ALPRAZolam (XANAX) 0.25 MG tablet can be sent to pharmacy. Please advise

## 2020-08-04 MED ORDER — ALPRAZOLAM 0.25 MG PO TABS: ORAL_TABLET | ORAL | 5 refills | Status: DC

## 2020-08-07 DIAGNOSIS — M5136 Other intervertebral disc degeneration, lumbar region: Secondary | ICD-10-CM | POA: Diagnosis not present

## 2020-08-16 DIAGNOSIS — M542 Cervicalgia: Secondary | ICD-10-CM | POA: Diagnosis not present

## 2020-08-16 DIAGNOSIS — M5481 Occipital neuralgia: Secondary | ICD-10-CM | POA: Diagnosis not present

## 2020-08-23 DIAGNOSIS — D485 Neoplasm of uncertain behavior of skin: Secondary | ICD-10-CM | POA: Diagnosis not present

## 2020-09-03 DIAGNOSIS — Z23 Encounter for immunization: Secondary | ICD-10-CM | POA: Diagnosis not present

## 2020-09-12 DIAGNOSIS — D485 Neoplasm of uncertain behavior of skin: Secondary | ICD-10-CM | POA: Diagnosis not present

## 2020-09-17 ENCOUNTER — Ambulatory Visit: Payer: Self-pay | Admitting: Physician Assistant

## 2020-09-19 DIAGNOSIS — G250 Essential tremor: Secondary | ICD-10-CM | POA: Diagnosis not present

## 2020-09-19 DIAGNOSIS — G43119 Migraine with aura, intractable, without status migrainosus: Secondary | ICD-10-CM | POA: Diagnosis not present

## 2020-09-19 DIAGNOSIS — M5481 Occipital neuralgia: Secondary | ICD-10-CM | POA: Diagnosis not present

## 2020-09-27 ENCOUNTER — Encounter: Payer: Self-pay | Admitting: Gastroenterology

## 2020-09-27 ENCOUNTER — Ambulatory Visit (INDEPENDENT_AMBULATORY_CARE_PROVIDER_SITE_OTHER): Payer: Medicare Other | Admitting: Gastroenterology

## 2020-09-27 ENCOUNTER — Other Ambulatory Visit: Payer: Self-pay

## 2020-09-27 VITALS — BP 128/64 | HR 73 | Temp 98.2°F | Wt 181.4 lb

## 2020-09-27 DIAGNOSIS — K5900 Constipation, unspecified: Secondary | ICD-10-CM

## 2020-09-27 NOTE — Progress Notes (Signed)
Vonda Antigua, MD [address]  [place], [place] [address]  Main: [phone]  Fax: [phone]   Primary Care Physician: Mar Daring, PA-C   Chief complaint: Constipation  HPI: Helen Ramsey is a 86 y.o. female previously seen for constipation.  Patient had already been taking Trulance at the time.  And had tried Amitiza before.  She was advised to increase water intake to 8 to 10 glasses of water a day, and Linzess was prescribed.  Patient here for follow-up of symptoms.  Patient states she lost her bottle of Linzess over the last 2 weeks and has not been taking it.  She has been taking MiraLAX daily instead.  With this she has a formed bowel movement once or twice a day.  No blood in bowel movements.  Good appetite.  No abdominal pain.  Previous history: Previously on Amitiza which did not help and was recently switched to Trulance about a month ago.  Has not noticed any changes in her bowel movements with it.  Also takes over-the-counter Colace 3 times a day in addition to that, and sometimes has to take milk of magnesia if does not have a bowel movement in 2 to 3 days.  If she goes 3 days without a bowel movement she states  that she disimpact herself.  Does drain intermittently.  No recent blood in stool.  Also reports "queasiness", but no vomiting.  States she does not drink enough water, but has tried to increase this.  Is also trying to increase fiber in her diet.  Has noticed that raw onions causes more bloating and nausea.  Also lactose products do the same.  Previously seen by Dr. Vira Agar for the same.  Last colonoscopy in 2014 with 4 polyps removed.  Pathology showed tubular adenoma in 2 of the polyps.  Others were benign.  No family history of colon cancer.  Upper endoscopy in March 2014 showed gastritis and hiatal hernia.  Biopsies negative for H. Pylori.  Patient refused surveillance colonoscopy in discussion with her daughter  previously  Current Outpatient Medications  Medication Sig Dispense Refill  . acetaminophen (TYLENOL) 500 MG tablet Take 500 mg by mouth daily.     Marland Kitchen ALPRAZolam (XANAX) 0.25 MG tablet TAKE ONE TABLET BY MOUTH TWO TIMES A DAY AS NEEDED FOR ANXIETY 60 tablet 5  . aspirin EC 81 MG tablet Take 81 mg by mouth daily.    Marland Kitchen atorvastatin (LIPITOR) 20 MG tablet TAKE 1 TABLET BY MOUTH EVERY DAY AT 6:00 PM 90 tablet 1  . azelastine (ASTELIN) 0.1 % nasal spray USE 1 SPRAY IN EACH NOSTRIL TWICE DAILY AS DIRECTED 30 mL 5  . Calcium Carbonate-Vitamin D 600-400 MG-UNIT tablet Take 1 tablet by mouth 2 (two) times daily. 180 tablet 1  . cetirizine (ZYRTEC) 10 MG tablet Take 10 mg daily by mouth.    . citalopram (CELEXA) 40 MG tablet TAKE 1 TABLET BY MOUTH EVERY DAY 90 tablet 1  . cyanocobalamin 1000 MCG tablet Take 1,000 mcg by mouth daily.    . Dentifrices (FLUORIDE TOOTHPASTE DT) Place onto teeth daily.     Marland Kitchen docusate sodium (COLACE) 100 MG capsule Take 300 mg by mouth 2 (two) times daily as needed for mild constipation.     Marland Kitchen EMGALITY 120 MG/ML SOAJ Inject 1 Dose as directed every 30 (thirty) days.   0  . famotidine (PEPCID) 20 MG tablet TAKE 1 TABLET BY MOUTH DAILY AS NEEDED FOR  HEARTBURN OR INDIGESTION 90 tablet 1  . fluticasone (FLONASE SENSIMIST) 27.5 MCG/SPRAY nasal spray Place 2 sprays into the nose daily. 10 g 12  . furosemide (LASIX) 20 MG tablet Take 1 tablet (20 mg total) by mouth daily. May take extra tab if weight up greater 3 pounds 180 tablet 1  . gabapentin (NEURONTIN) 100 MG capsule Start Gabapentin 100 mg (one tablet) in the morning, 100 mg in the afternoon (one tablet), and 200 mg (two tablets) in the evening. 360 capsule 1  . LINZESS 145 MCG CAPS capsule TAKE 1 CAPSULE(145 MCG) BY MOUTH DAILY 30 capsule 0  . lisinopril (ZESTRIL) 5 MG tablet TAKE 1 TABLET BY MOUTH DAILY 90 tablet 1  . Magnesium Hydroxide (MILK OF MAGNESIA PO) Take by mouth as needed.    . Magnesium Oxide, Antacid, 500 MG  CAPS Take 1 tablet by mouth as needed.    . Melatonin 1 MG TABS Take 1 tablet by mouth as needed.     . Omega-3 Fatty Acids (FISH OIL) 1200 MG CAPS Take 2 capsules by mouth daily.     Marland Kitchen omeprazole (PRILOSEC) 20 MG capsule TAKE 1 CAPSULE(20 MG) BY MOUTH DAILY 90 capsule 1  . phenazopyridine (AZO-TABS) 95 MG tablet Take 1 tablet (95 mg total) by mouth as needed. 30 tablet 3  . Polyethylene Glycol 3350 (MIRALAX PO) Take by mouth.    . potassium gluconate 595 (99 K) MG TABS tablet Take 1 tablet (595 mg total) by mouth 2 (two) times daily. 180 tablet 1  . Simethicone (GAS RELIEF PO) Take by mouth as needed.     . sodium chloride (OCEAN) 0.65 % nasal spray Place 1 spray into the nose as needed.     . traMADol (ULTRAM) 50 MG tablet TAKE 1 TABLET BY MOUTH TWICE DAILY AS NEEDED 180 tablet 0   No current facility-administered medications for this visit.    Allergies as of 09/27/2020 - Review Complete 06/11/2020  Allergen Reaction Noted  . Amoxicillin  07/06/2014  . Clindamycin/lincomycin  07/06/2014  . Doxycycline  07/06/2014  . Levaquin [levofloxacin in d5w]  07/06/2014  . Mirabegron Nausea And Vomiting 05/09/2015  . Nsaids Other (See Comments) 05/09/2015  . Oxycodone Nausea Only 03/31/2016  . Phenergan [promethazine hcl]  07/06/2014  . Singulair [montelukast]  07/06/2014  . Vicodin [hydrocodone-acetaminophen]  07/06/2014  . Zocor [simvastatin]  07/06/2014  . Zoloft  [sertraline] Other (See Comments) 03/28/2015  . Ampicillin Rash 05/09/2015  . Bactrim [sulfamethoxazole-trimethoprim] Rash 12/13/2015  . Penicillins Rash 03/28/2015  . Tolmetin  05/09/2015    ROS:  General: Negative for anorexia, weight loss, fever, chills, fatigue, weakness. ENT: Negative for hoarseness, difficulty swallowing , nasal congestion. CV: Negative for chest pain, angina, palpitations, dyspnea on exertion, peripheral edema.  Respiratory: Negative for dyspnea at rest, dyspnea on exertion, cough, sputum, wheezing.   GI: See history of present illness. GU:  Negative for dysuria, hematuria, urinary incontinence, urinary frequency, nocturnal urination.  Endo: Negative for unusual weight change.    Physical Examination:  Vitals:   09/27/20 1406  BP: 128/64  Pulse: 73  Temp: 98.2 F (36.8 C)  TempSrc: Oral  Weight: 181 lb 6.4 oz (82.3 kg)    General: Well-nourished, well-developed in no acute distress.  Eyes: No icterus. Conjunctivae pink. Mouth: Oropharyngeal mucosa moist and pink , no lesions erythema or exudate. Neck: Supple, Trachea midline Abdomen: Bowel sounds are normal, nontender, nondistended, no hepatosplenomegaly or masses, no abdominal bruits or hernia , no rebound or  guarding.   Extremities: No lower extremity edema. No clubbing or deformities. Neuro: Alert and oriented x 3.  Grossly intact. Skin: Warm and dry, no jaundice.   Psych: Alert and cooperative, normal mood and affect.   Labs: CMP     Component Value Date/Time   NA 140 02/27/2020 1028   NA 140 06/21/2014 1540   K 4.5 02/27/2020 1028   K 3.7 06/21/2014 1540   CL 103 02/27/2020 1028   CL 107 06/21/2014 1540   CO2 21 02/27/2020 1028   CO2 25 06/21/2014 1540   GLUCOSE 98 02/27/2020 1028   GLUCOSE 94 09/28/2018 0927   GLUCOSE 142 (H) 06/21/2014 1540   BUN 11 02/27/2020 1028   BUN 19 (H) 06/21/2014 1540   CREATININE 0.86 02/27/2020 1028   CREATININE 0.83 06/21/2014 1540   CALCIUM 9.6 02/27/2020 1028   CALCIUM 8.8 06/21/2014 1540   PROT 6.3 02/27/2020 1028   PROT 7.1 06/21/2014 1540   ALBUMIN 4.2 02/27/2020 1028   ALBUMIN 3.5 06/21/2014 1540   AST 21 02/27/2020 1028   AST 23 06/21/2014 1540   ALT 25 02/27/2020 1028   ALT 33 06/21/2014 1540   ALKPHOS 61 02/27/2020 1028   ALKPHOS 69 06/21/2014 1540   BILITOT 0.3 02/27/2020 1028   BILITOT 0.4 06/21/2014 1540   GFRNONAA 62 02/27/2020 1028   GFRNONAA >60 06/21/2014 1540   GFRAA 72 02/27/2020 1028   GFRAA >60 06/21/2014 1540   Lab Results  Component Value  Date   WBC 4.3 02/27/2020   HGB 12.0 02/27/2020   HCT 34.8 02/27/2020   MCV 89 02/27/2020   PLT 218 02/27/2020    Imaging Studies: No results found.  Assessment and Plan:   DONESHIA HILL is a 86 y.o. y/o female here for follow-up of constipation  Constipation resolved with high-fiber diet and MiraLAX  Patient instructed in titrating MiraLAX to a lower dose if she starts having multiple bowel movements a day or loose bowel movements and she verbalized understanding.  Patient advised to skip a day of MiraLAX if above occurs as well  If symptoms of constipation recur, patient advised to call us and we can reconsider Linzess after discussing further with her at that time  Follow-up as needed  Dr Vonda Antigua

## 2020-10-03 ENCOUNTER — Ambulatory Visit: Payer: Self-pay | Admitting: Physician Assistant

## 2020-10-09 ENCOUNTER — Ambulatory Visit (INDEPENDENT_AMBULATORY_CARE_PROVIDER_SITE_OTHER): Payer: Medicare Other | Admitting: Physician Assistant

## 2020-10-09 ENCOUNTER — Encounter: Payer: Self-pay | Admitting: Physician Assistant

## 2020-10-09 ENCOUNTER — Other Ambulatory Visit: Payer: Self-pay

## 2020-10-09 VITALS — BP 110/63 | HR 63 | Temp 97.2°F | Resp 16 | Wt 183.4 lb

## 2020-10-09 DIAGNOSIS — M7989 Other specified soft tissue disorders: Secondary | ICD-10-CM

## 2020-10-09 DIAGNOSIS — F32A Depression, unspecified: Secondary | ICD-10-CM | POA: Diagnosis not present

## 2020-10-09 DIAGNOSIS — F419 Anxiety disorder, unspecified: Secondary | ICD-10-CM | POA: Diagnosis not present

## 2020-10-09 DIAGNOSIS — G8929 Other chronic pain: Secondary | ICD-10-CM

## 2020-10-09 DIAGNOSIS — K5904 Chronic idiopathic constipation: Secondary | ICD-10-CM | POA: Diagnosis not present

## 2020-10-09 DIAGNOSIS — I10 Essential (primary) hypertension: Secondary | ICD-10-CM | POA: Diagnosis not present

## 2020-10-09 DIAGNOSIS — R519 Headache, unspecified: Secondary | ICD-10-CM

## 2020-10-09 DIAGNOSIS — M79606 Pain in leg, unspecified: Secondary | ICD-10-CM | POA: Diagnosis not present

## 2020-10-09 NOTE — Progress Notes (Signed)
Established patient visit   Patient: Helen Ramsey   DOB: 1933-01-16   86 y.o. Female  MRN: [id number] Visit Date: 10/09/2020  Today's healthcare provider: Mar Daring, PA-C   Chief Complaint  Patient presents with  . Follow-up   Subjective    HPI  Hypertension, follow-up  BP Readings from Last 3 Encounters:  10/09/20 110/63  09/27/20 128/64  06/11/20 113/66   Wt Readings from Last 3 Encounters:  10/09/20 183 lb 6.4 oz (83.2 kg)  09/27/20 181 lb 6.4 oz (82.3 kg)  06/11/20 173 lb 3.2 oz (78.6 kg)     She was last seen for hypertension 4 months ago.  BP at that visit was 113/66. Management since that visit includes Continue Lisinopril 5mg  daily.  She reports excellent compliance with treatment. She is not having side effects.  She is following a Regular diet.   Outside blood pressures are 112/70.   Symptoms: No chest pain No chest pressure  No palpitations No syncope  No dyspnea No orthopnea  No paroxysmal nocturnal dyspnea No lower extremity edema   Pertinent labs: Lab Results  Component Value Date   CHOL 169 02/27/2020   HDL 41 02/27/2020   LDLCALC 102 (H) 02/27/2020   TRIG 146 02/27/2020   CHOLHDL 3.0 12/27/2018   Lab Results  Component Value Date   NA 140 02/27/2020   K 4.5 02/27/2020   CREATININE 0.86 02/27/2020   GFRNONAA 62 02/27/2020   GFRAA 72 02/27/2020   GLUCOSE 98 02/27/2020     The ASCVD Risk score ([place]., et al., 2013) failed to calculate for the following reasons:   The 2013 ASCVD risk score is only valid for ages 54 to 23   The patient has a prior MI or stroke diagnosis   --------------------------------------------------------------------------------------------------- Follow up for anxiety and Depression  The patient was last seen for this 4 months ago. Changes made at last visit include none.Continue Citalopram 40mg  and Alprazolam 0.25mg  BID PRN  She reports excellent compliance with treatment. She feels  that condition is Improved.  -----------------------------------------------------------------------------------------  Patient Active Problem List   Diagnosis Date Noted  . Recurrent major depressive disorder, in partial remission ([place]) 02/27/2020  . Bilateral myofascial pain 06/01/2019  . Bilateral occipital neuralgia 06/01/2019  . Age-related osteoporosis without current pathological fracture 08/16/2018  . Gait instability 08/16/2018  . Blurred vision, right eye 08/16/2018  . Ocular pain, right eye 08/16/2018  . Dorsalgia 03/31/2016  . H/O total knee replacement 02/14/2016  . Systolic ejection murmur 40/98/1191  . Vitamin D deficiency 11/29/2015  . Allergy to other foods 11/29/2015  . Leg cramps 10/24/2015  . Cramp and spasm 10/24/2015  . Knee pain, bilateral 08/29/2015  . Sleep apnea 05/30/2015  . Restless legs syndrome 05/09/2015  . Allergic rhinitis 03/28/2015  . Anemia, unspecified 03/28/2015  . Anxiety and depression 03/28/2015  . Neck pain 03/28/2015  . Chronic headache 03/28/2015  . DDD (degenerative disc disease), lumbar 03/28/2015  . Gastro-esophageal reflux disease without esophagitis 03/28/2015  . 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 Essential (primary) hypertension 03/28/2015  . Adaptive colitis 03/28/2015  . OP (osteoporosis) 03/28/2015  . Nasal septal perforation 03/28/2015  . Spinal stenosis 03/28/2015  . Gastric ulcer without hemorrhage or perforation 03/28/2015  . Cerebral artery occlusion with cerebral infarction ([place]) 03/28/2015  . Chronic sinusitis 03/28/2015  . Connective tissue and disc stenosis of intervertebral foramina of abdomen and other regions 03/28/2015  . Diverticulosis of large intestine  without perforation or abscess without bleeding 03/28/2015  . Osteoarthritis 03/28/2015  . Other specified functional intestinal disorders 03/28/2015  . Hardening of the aorta (main artery of the heart) ([place]) 07/13/2014  . H/O  adenomatous polyp of colon 06/27/2005   Past Medical History:  Diagnosis Date  . Allergic rhinitis, cause unspecified   . Allergic rhinitis, cause unspecified   . Atherosclerosis of aorta ([place])   . Back pain    lumbar  . Degeneration of lumbar or lumbosacral intervertebral disc   . Diverticulitis of colon (without mention of hemorrhage)(562.11)   . Dizziness and giddiness   . Dysthymic disorder   . Headache(784.0)   . Myalgia and myositis, unspecified   . Osteoporosis, unspecified   . Other and unspecified hyperlipidemia   . Other diseases of nasal cavity and sinuses(478.19)   . Other specified disorders of bladder   . Syncope   . Trigger finger (acquired)   . Unspecified essential hypertension   . Unspecified sleep apnea   . Urinary frequency        Medications: Outpatient Medications Prior to Visit  Medication Sig  . acetaminophen (TYLENOL) 500 MG tablet Take 500 mg by mouth in the morning and at bedtime.   . ALPRAZolam (XANAX) 0.25 MG tablet TAKE ONE TABLET BY MOUTH TWO TIMES A DAY AS NEEDED FOR ANXIETY  . aspirin EC 81 MG tablet Take 81 mg by mouth daily.  Marland Kitchen atorvastatin (LIPITOR) 20 MG tablet TAKE 1 TABLET BY MOUTH EVERY DAY AT 6:00 PM  . Calcium Carbonate-Vitamin D 600-400 MG-UNIT tablet Take 1 tablet by mouth 2 (two) times daily.  . cetirizine (ZYRTEC) 10 MG tablet Take 10 mg daily by mouth.  . citalopram (CELEXA) 40 MG tablet TAKE 1 TABLET BY MOUTH EVERY DAY  . cyanocobalamin 1000 MCG tablet Take 1,000 mcg by mouth daily.  . Dentifrices (FLUORIDE TOOTHPASTE DT) Place onto teeth daily.   . famotidine (PEPCID) 20 MG tablet TAKE 1 TABLET BY MOUTH DAILY AS NEEDED FOR HEARTBURN OR INDIGESTION  . furosemide (LASIX) 20 MG tablet Take 1 tablet (20 mg total) by mouth daily. May take extra tab if weight up greater 3 pounds  . gabapentin (NEURONTIN) 100 MG capsule Start Gabapentin 100 mg (one tablet) in the morning, 100 mg in the afternoon (one tablet), and 200 mg (two tablets)  in the evening. (Patient taking differently: Take 200 mg by mouth 3 (three) times daily. Start Gabapentin 200 mg (one tablet) in the morning, 200 mg in the afternoon (one tablet), and 300 mg in the evening.)  . Galcanezumab-gnlm (EMGALITY [place]) Inject 300 mg as directed every 30 (thirty) days.   Marland Kitchen lisinopril (ZESTRIL) 5 MG tablet TAKE 1 TABLET BY MOUTH DAILY  . MAGNESIUM GLYCINATE PO Take 500 mg by mouth in the morning and at bedtime.  . Magnesium Hydroxide (MILK OF MAGNESIA PO) Take by mouth as needed.  . melatonin 5 MG TABS Take 5 mg by mouth at bedtime.   . Omega-3 Fatty Acids (FISH OIL) 1200 MG CAPS Take 2 capsules by mouth daily.   Marland Kitchen omeprazole (PRILOSEC) 20 MG capsule TAKE 1 CAPSULE(20 MG) BY MOUTH DAILY  . phenazopyridine (AZO-TABS) 95 MG tablet Take 1 tablet (95 mg total) by mouth as needed. (Patient taking differently: Take 95 mg by mouth in the morning and at bedtime. )  . Polyethylene Glycol 3350 (MIRALAX PO) Take by mouth.  . potassium gluconate 595 (99 K) MG TABS tablet Take 1 tablet (595 mg total) by  mouth 2 (two) times daily.  . sodium chloride (OCEAN) 0.65 % nasal spray Place 1 spray into the nose as needed.   . traMADol (ULTRAM) 50 MG tablet TAKE 1 TABLET BY MOUTH TWICE DAILY AS NEEDED (Patient taking differently: 2 (two) times daily. TAKE 1 TABLET BY MOUTH TWICE DAILY)  . [DISCONTINUED] docusate sodium (COLACE) 100 MG capsule Take 300 mg by mouth 2 (two) times daily as needed for mild constipation.   . [DISCONTINUED] magnesium gluconate (MAGONATE) 500 MG tablet Take 500 mg by mouth 2 (two) times daily.  . [DISCONTINUED] Simethicone (GAS RELIEF PO) Take by mouth as needed.    No facility-administered medications prior to visit.    Review of Systems  Constitutional: Negative.   Respiratory: Negative.   Cardiovascular: Positive for leg swelling. Negative for chest pain and palpitations.  Gastrointestinal: Negative.   Neurological: Negative.   Psychiatric/Behavioral: Negative.       Last CBC Lab Results  Component Value Date   WBC 4.3 02/27/2020   HGB 12.0 02/27/2020   HCT 34.8 02/27/2020   MCV 89 02/27/2020   MCH 30.7 02/27/2020   RDW 12.5 02/27/2020   PLT 218 67/20/9470   Last metabolic panel Lab Results  Component Value Date   GLUCOSE 98 02/27/2020   NA 140 02/27/2020   K 4.5 02/27/2020   CL 103 02/27/2020   CO2 21 02/27/2020   BUN 11 02/27/2020   CREATININE 0.86 02/27/2020   GFRNONAA 62 02/27/2020   GFRAA 72 02/27/2020   CALCIUM 9.6 02/27/2020   PROT 6.3 02/27/2020   ALBUMIN 4.2 02/27/2020   LABGLOB 2.1 02/27/2020   AGRATIO 2.0 02/27/2020   BILITOT 0.3 02/27/2020   ALKPHOS 61 02/27/2020   AST 21 02/27/2020   ALT 25 02/27/2020   ANIONGAP 7 09/28/2018      Objective    BP 110/63 (BP Location: Left Arm, Patient Position: Sitting, Cuff Size: Large)   Pulse 63   Temp (!) 97.2 F (36.2 C) (Oral)   Resp 16   Wt 183 lb 6.4 oz (83.2 kg)   BMI 37.04 kg/m  BP Readings from Last 3 Encounters:  10/09/20 110/63  09/27/20 128/64  06/11/20 113/66   Wt Readings from Last 3 Encounters:  10/09/20 183 lb 6.4 oz (83.2 kg)  09/27/20 181 lb 6.4 oz (82.3 kg)  06/11/20 173 lb 3.2 oz (78.6 kg)      Physical Exam Vitals reviewed.  Constitutional:      General: She is not in acute distress.    Appearance: Normal appearance. She is well-developed. She is not ill-appearing.  HENT:     Head: Normocephalic and atraumatic.  Pulmonary:     Effort: Pulmonary effort is normal. No respiratory distress.  Musculoskeletal:     Cervical back: Normal range of motion and neck supple.  Neurological:     Mental Status: She is alert.  Psychiatric:        Mood and Affect: Mood normal.        Behavior: Behavior normal.        Thought Content: Thought content normal.        Judgment: Judgment normal.       No results found for any visits on 10/09/20.  Assessment & Plan     1. Pain and swelling of lower extremity, unspecified laterality Bilateral.  Will check labs as below and f/u pending results. Continue furosemide 20mg  as needed. Discussed trying to walk more and be more active.  - Comprehensive metabolic  panel  2. Chronic idiopathic constipation Improved with magnesium.  3. Chronic nonintractable headache, unspecified headache type Improved with Magnesium.   4. Primary hypertension Stable. Continue Lisinopril 5mg .  5. Anxiety and depression Stable. Continue Citalopram 40mg  and alprazolam 0.25mg  BID prn.    Return in about 3 months (around 01/09/2021).      Reynolds Bowl, PA-C, have reviewed all documentation for this visit. The documentation on 10/10/20 for the exam, diagnosis, procedures, and orders are all accurate and complete.   Rubye Beach  [facility] [phone] (phone) [phone] (fax)  [place]

## 2020-10-10 ENCOUNTER — Encounter: Payer: Self-pay | Admitting: Physician Assistant

## 2020-10-13 ENCOUNTER — Other Ambulatory Visit: Payer: Self-pay | Admitting: Physician Assistant

## 2020-10-13 DIAGNOSIS — K219 Gastro-esophageal reflux disease without esophagitis: Secondary | ICD-10-CM

## 2020-10-13 DIAGNOSIS — F411 Generalized anxiety disorder: Secondary | ICD-10-CM

## 2020-10-13 NOTE — Telephone Encounter (Signed)
Requested medications are due for refill today yes  Requested medications are on the active medication list yes  Last refill 8/29  Last visit Do not see this med/dx addressed   Notes to clinic Please assess.

## 2020-10-15 ENCOUNTER — Other Ambulatory Visit: Payer: Self-pay | Admitting: Physician Assistant

## 2020-10-15 DIAGNOSIS — M79606 Pain in leg, unspecified: Secondary | ICD-10-CM | POA: Diagnosis not present

## 2020-10-15 DIAGNOSIS — R600 Localized edema: Secondary | ICD-10-CM

## 2020-10-15 DIAGNOSIS — M7989 Other specified soft tissue disorders: Secondary | ICD-10-CM | POA: Diagnosis not present

## 2020-10-15 MED ORDER — FUROSEMIDE 20 MG PO TABS: 20.0000 mg | ORAL_TABLET | Freq: Every day | ORAL | 1 refills | Status: DC

## 2020-10-15 NOTE — Telephone Encounter (Signed)
The PEC didn't approve it because in the last visit this was not addressed

## 2020-10-15 NOTE — Telephone Encounter (Signed)
[place] faxed refill request for the following medications:  2nd request: furosemide (LASIX) 20 MG tablet  Please advise. Thanks, [employer]

## 2020-10-16 LAB — COMPREHENSIVE METABOLIC PANEL
ALT: 25 IU/L (ref 0–32)
AST: 24 IU/L (ref 0–40)
Albumin/Globulin Ratio: 1.8 (ref 1.2–2.2)
Albumin: 4.2 g/dL (ref 3.6–4.6)
Alkaline Phosphatase: 62 IU/L (ref 44–121)
BUN/Creatinine Ratio: 24 (ref 12–28)
BUN: 20 mg/dL (ref 8–27)
Bilirubin Total: 0.5 mg/dL (ref 0.0–1.2)
CO2: 24 mmol/L (ref 20–29)
Calcium: 9.3 mg/dL (ref 8.7–10.3)
Chloride: 99 mmol/L (ref 96–106)
Creatinine, Ser: 0.85 mg/dL (ref 0.57–1.00)
GFR calc Af Amer: 72 mL/min/{1.73_m2} (ref >59)
GFR calc non Af Amer: 63 mL/min/{1.73_m2} (ref >59)
Globulin, Total: 2.4 g/dL (ref 1.5–4.5)
Glucose: 92 mg/dL (ref 65–99)
Potassium: 4.2 mmol/L (ref 3.5–5.2)
Sodium: 137 mmol/L (ref 134–144)
Total Protein: 6.6 g/dL (ref 6.0–8.5)

## 2020-10-23 ENCOUNTER — Other Ambulatory Visit: Payer: Self-pay | Admitting: Physician Assistant

## 2020-10-23 DIAGNOSIS — M1990 Unspecified osteoarthritis, unspecified site: Secondary | ICD-10-CM

## 2020-10-23 MED ORDER — TRAMADOL HCL 50 MG PO TABS: ORAL_TABLET | ORAL | 1 refills | Status: DC

## 2020-10-23 NOTE — Telephone Encounter (Signed)
Requested medication (s) are due for refill today: yes  Requested medication (s) are on the active medication list: yes  Last refill:  07/13/20  Future visit scheduled: yes  Notes to clinic:  med not delegated to NT to RF   Requested Prescriptions  Pending Prescriptions Disp Refills   traMADol (ULTRAM) 50 MG tablet 180 tablet 0    Sig: TAKE 1 TABLET BY MOUTH TWICE DAILY AS NEEDED      Not Delegated - Analgesics:  Opioid Agonists Failed - 10/23/2020 10:26 AM      Failed - This refill cannot be delegated      Failed - Urine Drug Screen completed in last 360 days      Passed - Valid encounter within last 6 months    Recent Outpatient Visits           2 weeks ago Pain and swelling of lower extremity, unspecified laterality   [name] [place], [place], PA-C   4 months ago Essential (primary) hypertension   [place], [place], [place]   7 months ago Seasonal allergies   [place], [place]   11 months ago Chronic idiopathic constipation   [place], [place]   1 year ago Chronic idiopathic constipation   [place], Clearnce Sorrel, [place]       Future Appointments             In 2 months [place], Clearnce Sorrel, PA-C [employer], [place]

## 2020-10-23 NOTE — Telephone Encounter (Signed)
Pt request refill  traMADol (ULTRAM) 50 MG tablet 90 day 180 tabs  [id number] [address], [address] AT [address] Phone:  [phone]  Fax:  [phone]     [name] states they have called the pharmacy several times, but I do not see this request

## 2020-11-12 ENCOUNTER — Telehealth: Payer: Self-pay

## 2020-11-12 NOTE — Telephone Encounter (Signed)
Copied from [place] [id number]. Topic: General - Inquiry >> Nov 12, 2020 10:47 AM Gillis Ends D wrote: Reason for CRM: Patient would like a return call back from the nurse, she would like to know if she can get an increase in her lasix. She doesn't want to come in. She can be reached at [phone]

## 2020-11-12 NOTE — Telephone Encounter (Signed)
It is ok to increase lasix to 40mg  (two tabs) until swelling in legs improves and weight is back down to 183-184 pounds. Once this improves she can decrease back down to 20mg  (one tab).

## 2020-11-12 NOTE — Telephone Encounter (Signed)
Patient advised as below. Patient verbalizes understanding and is in agreement with treatment plan.  

## 2020-11-12 NOTE — Telephone Encounter (Signed)
Patient reports swelling on ankles and feet for several days. Patient reports her normal weight has been around 183-184lbs this morning her weight was 187lb. Patient would like to know if lasix can be increased. She reports blood pressure has been stable and no worsening shortness of breath. Please advise.

## 2020-11-23 ENCOUNTER — Encounter: Payer: Self-pay | Admitting: Physician Assistant

## 2020-11-23 ENCOUNTER — Telehealth (INDEPENDENT_AMBULATORY_CARE_PROVIDER_SITE_OTHER): Payer: Medicare Other | Admitting: Physician Assistant

## 2020-11-23 VITALS — Wt 181.2 lb

## 2020-11-23 DIAGNOSIS — R059 Cough, unspecified: Secondary | ICD-10-CM

## 2020-11-23 DIAGNOSIS — M7989 Other specified soft tissue disorders: Secondary | ICD-10-CM

## 2020-11-23 NOTE — Progress Notes (Signed)
Virtual telephone visit    Virtual Visit via Telephone Note   This visit type was conducted due to national recommendations for restrictions regarding the COVID-19 Pandemic (e.g. social distancing) in an effort to limit this patient's exposure and mitigate transmission in our community. Due to her co-morbid illnesses, this patient is at least at moderate risk for complications without adequate follow up. This format is felt to be most appropriate for this patient at this time. The patient did not have access to video technology or had technical difficulties with video requiring transitioning to audio format only (telephone). Physical exam was limited to content and character of the telephone converstion.    Patient location: Home Provider location: Home office in [place]  I discussed the limitations of evaluation and management by telemedicine and the availability of in person appointments. The patient expressed understanding and agreed to proceed.   Visit Date: 11/23/2020  Today's healthcare provider: Mar Daring, PA-C   Chief Complaint  Patient presents with   Cough   Subjective    Cough This is a new problem. The current episode started 1 to 4 weeks ago (since Christmas off and on). The cough is non-productive. Associated symptoms include wheezing ("some"). Pertinent negatives include no chest pain, ear congestion, fever, headaches, nasal congestion, postnasal drip, sore throat or shortness of breath. Associated symptoms comments: Edema in her legs. Nothing aggravates the symptoms. Treatments tried: cough syrup, humidifier. The treatment provided mild relief.    Patient does happen to mention that she is noticing her cough is more prominent when her legs are more swollen. She reports since increasing her furosemide the cough is improving with the leg swelling. She has never been diagnosed with heart failure. She does report a strong family history of heart disease in  her parents and siblings.    Patient Active Problem List   Diagnosis Date Noted   Recurrent major depressive disorder, in partial remission ([place]) 02/27/2020   Bilateral myofascial pain 06/01/2019   Bilateral occipital neuralgia 06/01/2019   Age-related osteoporosis without current pathological fracture 08/16/2018   Gait instability 08/16/2018   Blurred vision, right eye 08/16/2018   Ocular pain, right eye 08/16/2018   Dorsalgia 03/31/2016   H/O total knee replacement 16/08/9603   Systolic ejection murmur 54/07/8118   Vitamin D deficiency 11/29/2015   Allergy to other foods 11/29/2015   Leg cramps 10/24/2015   Cramp and spasm 10/24/2015   Knee pain, bilateral 08/29/2015   Sleep apnea 05/30/2015   Restless legs syndrome 05/09/2015   Allergic rhinitis 03/28/2015   Anemia, unspecified 03/28/2015   Anxiety and depression 03/28/2015   Neck pain 03/28/2015   Chronic headache 03/28/2015   DDD (degenerative disc disease), lumbar 03/28/2015   Gastro-esophageal reflux disease without esophagitis 03/28/2015   Familial multiple lipoprotein-type hyperlipidemia 03/28/2015   Essential (primary) hypertension 03/28/2015   Adaptive colitis 03/28/2015   OP (osteoporosis) 03/28/2015   Nasal septal perforation 03/28/2015   Spinal stenosis 03/28/2015   Gastric ulcer without hemorrhage or perforation 03/28/2015   Cerebral artery occlusion with cerebral infarction ([place]) 03/28/2015   Chronic sinusitis 03/28/2015   Connective tissue and disc stenosis of intervertebral foramina of abdomen and other regions 03/28/2015   Diverticulosis of large intestine without perforation or abscess without bleeding 03/28/2015   Osteoarthritis 03/28/2015   Other specified functional intestinal disorders 03/28/2015   Hardening of the aorta (main artery of the heart) ([place]) 07/13/2014   H/O adenomatous polyp of colon 06/27/2005   Past  Medical History:  Diagnosis Date   Allergic  rhinitis, cause unspecified    Allergic rhinitis, cause unspecified    Atherosclerosis of aorta ([facility])    Back pain    lumbar   Degeneration of lumbar or lumbosacral intervertebral disc    Diverticulitis of colon (without mention of hemorrhage)(562.11)    Dizziness and giddiness    Dysthymic disorder    Headache(784.0)    Myalgia and myositis, unspecified    Osteoporosis, unspecified    Other and unspecified hyperlipidemia    Other diseases of nasal cavity and sinuses(478.19)    Other specified disorders of bladder    Syncope    Trigger finger (acquired)    Unspecified essential hypertension    Unspecified sleep apnea    Urinary frequency       Medications: Outpatient Medications Prior to Visit  Medication Sig   acetaminophen (TYLENOL) 500 MG tablet Take 500 mg by mouth in the morning and at bedtime.    ALPRAZolam (XANAX) 0.25 MG tablet TAKE ONE TABLET BY MOUTH TWO TIMES A DAY AS NEEDED FOR ANXIETY   aspirin EC 81 MG tablet Take 81 mg by mouth daily.   atorvastatin (LIPITOR) 20 MG tablet TAKE 1 TABLET BY MOUTH EVERY DAY AT 6:00 PM   Calcium Carbonate-Vitamin D 600-400 MG-UNIT tablet Take 1 tablet by mouth 2 (two) times daily.   cetirizine (ZYRTEC) 10 MG tablet Take 10 mg daily by mouth.   citalopram (CELEXA) 40 MG tablet TAKE 1 TABLET BY MOUTH EVERY DAY   cyanocobalamin 1000 MCG tablet Take 1,000 mcg by mouth daily.   Dentifrices (FLUORIDE TOOTHPASTE DT) Place onto teeth daily.    furosemide (LASIX) 20 MG tablet Take 1 tablet (20 mg total) by mouth daily. May take extra tab if weight up greater 3 pounds   gabapentin (NEURONTIN) 100 MG capsule Start Gabapentin 100 mg (one tablet) in the morning, 100 mg in the afternoon (one tablet), and 200 mg (two tablets) in the evening. (Patient taking differently: Take 200 mg by mouth 3 (three) times daily. Start Gabapentin 200 mg (one tablet) in the morning, 200 mg in the afternoon (one tablet), and 300 mg in the  evening.)   Galcanezumab-gnlm (EMGALITY [place]) Inject 300 mg as directed every 30 (thirty) days.    lisinopril (ZESTRIL) 5 MG tablet TAKE 1 TABLET BY MOUTH DAILY   MAGNESIUM GLYCINATE PO Take 500 mg by mouth in the morning and at bedtime.   melatonin 5 MG TABS Take 5 mg by mouth at bedtime.    Omega-3 Fatty Acids (FISH OIL) 1200 MG CAPS Take 2 capsules by mouth daily.    omeprazole (PRILOSEC) 20 MG capsule TAKE 1 CAPSULE(20 MG) BY MOUTH DAILY   phenazopyridine (AZO-TABS) 95 MG tablet Take 1 tablet (95 mg total) by mouth as needed. (Patient taking differently: Take 95 mg by mouth in the morning and at bedtime.)   Polyethylene Glycol 3350 (MIRALAX PO) Take by mouth.   potassium gluconate 595 (99 K) MG TABS tablet Take 1 tablet (595 mg total) by mouth 2 (two) times daily.   sodium chloride (OCEAN) 0.65 % nasal spray Place 1 spray into the nose as needed.    traMADol (ULTRAM) 50 MG tablet TAKE 1 TABLET BY MOUTH TWICE DAILY AS NEEDED   famotidine (PEPCID) 20 MG tablet TAKE 1 TABLET BY MOUTH DAILY AS NEEDED FOR HEARTBURN OR INDIGESTION (Patient not taking: Reported on 11/23/2020)   Magnesium Hydroxide (MILK OF MAGNESIA PO) Take by mouth as needed. (  Patient not taking: Reported on 11/23/2020)   No facility-administered medications prior to visit.    Review of Systems  Constitutional: Negative for fever.  HENT: Negative for postnasal drip and sore throat.   Respiratory: Positive for cough, chest tightness and wheezing ("some"). Negative for shortness of breath.   Cardiovascular: Positive for leg swelling. Negative for chest pain and palpitations.  Neurological: Negative for dizziness and headaches.    Last CBC Lab Results  Component Value Date   WBC 4.3 02/27/2020   HGB 12.0 02/27/2020   HCT 34.8 02/27/2020   MCV 89 02/27/2020   MCH 30.7 02/27/2020   RDW 12.5 02/27/2020   PLT 218 02/40/9735   Last metabolic panel Lab Results  Component Value Date   GLUCOSE 92 10/15/2020   NA  137 10/15/2020   K 4.2 10/15/2020   CL 99 10/15/2020   CO2 24 10/15/2020   BUN 20 10/15/2020   CREATININE 0.85 10/15/2020   GFRNONAA 63 10/15/2020   GFRAA 72 10/15/2020   CALCIUM 9.3 10/15/2020   PROT 6.6 10/15/2020   ALBUMIN 4.2 10/15/2020   LABGLOB 2.4 10/15/2020   AGRATIO 1.8 10/15/2020   BILITOT 0.5 10/15/2020   ALKPHOS 62 10/15/2020   AST 24 10/15/2020   ALT 25 10/15/2020   ANIONGAP 7 09/28/2018      Objective    Wt 181 lb 3.2 oz (82.2 kg)    BMI 36.60 kg/m  BP Readings from Last 3 Encounters:  10/09/20 110/63  09/27/20 128/64  06/11/20 113/66   Wt Readings from Last 3 Encounters:  11/23/20 181 lb 3.2 oz (82.2 kg)  10/09/20 183 lb 6.4 oz (83.2 kg)  09/27/20 181 lb 6.4 oz (82.3 kg)       Assessment & Plan     1. Leg swelling Has been improving with taking furosemide 40mg . Weight is also back down to 181. Had gotten up to close to 189 pounds. Advised patient she could decrease back to furosemide 20mg , but if weight increases, leg swelling increases, or cough increases, she should increase back to 40mg  and stay at that dose. She is to call if symptoms worsen. If not, I will see her in February for labs and consideration of echocardiogram vs cardiology referral. Patient agreeable to plan.   2. Cough Possible early heart falure. See above medical treatment plan.   No follow-ups on file.    I discussed the assessment and treatment plan with the patient. The patient was provided an opportunity to ask questions and all were answered. The patient agreed with the plan and demonstrated an understanding of the instructions.   The patient was advised to call back or seek an in-person evaluation if the symptoms worsen or if the condition fails to improve as anticipated.  I provided 28 minutes of non-face-to-face time during this encounter.  Reynolds Bowl, PA-C, have reviewed all documentation for this visit. The documentation on 11/24/20 for the exam, diagnosis,  procedures, and orders are all accurate and complete.  Rubye Beach [facility] [phone] (phone) [phone] (fax)  [place]

## 2020-11-24 ENCOUNTER — Encounter: Payer: Self-pay | Admitting: Physician Assistant

## 2020-12-20 ENCOUNTER — Ambulatory Visit (INDEPENDENT_AMBULATORY_CARE_PROVIDER_SITE_OTHER): Payer: Medicare Other | Admitting: Physician Assistant

## 2020-12-20 ENCOUNTER — Other Ambulatory Visit: Payer: Self-pay

## 2020-12-20 ENCOUNTER — Encounter: Payer: Self-pay | Admitting: Physician Assistant

## 2020-12-20 VITALS — BP 157/73 | HR 67 | Temp 98.3°F | Wt 182.0 lb

## 2020-12-20 DIAGNOSIS — I10 Essential (primary) hypertension: Secondary | ICD-10-CM | POA: Diagnosis not present

## 2020-12-20 DIAGNOSIS — R252 Cramp and spasm: Secondary | ICD-10-CM

## 2020-12-20 DIAGNOSIS — F419 Anxiety disorder, unspecified: Secondary | ICD-10-CM | POA: Diagnosis not present

## 2020-12-20 DIAGNOSIS — R059 Cough, unspecified: Secondary | ICD-10-CM

## 2020-12-20 DIAGNOSIS — F32A Depression, unspecified: Secondary | ICD-10-CM

## 2020-12-20 DIAGNOSIS — R0602 Shortness of breath: Secondary | ICD-10-CM

## 2020-12-20 MED ORDER — FLUTICASONE FUROATE-VILANTEROL 100-25 MCG/INH IN AEPB: 1.0000 | INHALATION_SPRAY | Freq: Every day | RESPIRATORY_TRACT | 0 refills | Status: DC

## 2020-12-20 NOTE — Patient Instructions (Addendum)
Take 40mg  lasix twice daily   Heart Failure Eating Plan Heart failure, also called congestive heart failure, occurs when your heart does not pump blood well enough to meet your body's needs for oxygen-rich blood. Heart failure is a long-term (chronic) condition. Living with heart failure can be challenging. Following your health care provider's instructions about a healthy lifestyle and working with a dietitian to choose the right foods may help to improve your symptoms. An eating plan for someone with heart failure will include changes that limit the intake of salt (sodium) and unhealthy fat. What are tips for following this plan? Reading food labels  Check food labels for the amount of sodium per serving. Choose foods that have less than 140 mg (milligrams) of sodium in each serving.  Check food labels for the number of calories per serving. This is important if you need to limit your daily calorie intake to lose weight.  Check food labels for the serving size. If you eat more than one serving, you will be eating more sodium and calories than what is listed on the label.  Look for foods that are labeled as "sodium-free," "very low sodium," or "low sodium." ? Foods labeled as "reduced sodium" or "lightly salted" may still have more sodium than what is recommended for you. Cooking  Avoid adding salt when cooking. Ask your health care provider or dietitian before using salt substitutes.  Season food with salt-free seasonings, spices, or herbs. Check the label of seasoning mixes to make sure they do not contain salt.  Cook with heart-healthy oils, such as olive, canola, soybean, or sunflower oil.  Do not fry foods. Cook foods using low-fat methods, such as baking, boiling, grilling, and broiling.  Limit unhealthy fats when cooking by: ? Removing the skin from poultry, such as chicken. ? Removing all visible fats from meats. ? Skimming the fat off from stews, soups, and gravies before  serving them. Meal planning  Limit your intake of: ? Processed, canned, or prepackaged foods. ? Foods that are high in trans fat, such as fried foods. ? Sweets, desserts, sugary drinks, and other foods with added sugar. ? Full-fat dairy products, such as whole milk.  Eat a balanced diet. This may include: ? 4-5 servings of fruit each day and 4-5 servings of vegetables each day. At each meal, try to fill one-half of your plate with fruits and vegetables. ? Up to 6-8 servings of whole grains each day. ? Up to 2 servings of lean meat, poultry, or fish each day. One serving of meat is equal to 3 oz (85 g). This is about the same size as a deck of cards. ? 2 servings of low-fat dairy each day. ? Heart-healthy fats. Healthy fats called omega-3 fatty acids are found in foods such as flaxseed and cold-water fish like sardines, salmon, and mackerel.  Aim to eat 25-35 g (grams) of fiber a day. Foods that are high in fiber include apples, broccoli, carrots, beans, peas, and whole grains.  Do not add salt or condiments that contain salt (such as soy sauce) to foods before eating.  When eating at a restaurant, ask that your food be prepared with less salt or no salt, if possible.  Try to eat 2 or more vegetarian meals each week.  Eat more home-cooked food and eat less restaurant, buffet, and fast food.   General information  Do not eat more than 2,300 mg of sodium a day. The amount of sodium that is recommended for  you may be lower, depending on your condition.  Maintain a healthy body weight as directed. Ask your health care provider what a healthy weight is for you. ? Check your weight every day. ? Work with your health care provider and dietitian to make a plan that is right for you to lose weight or maintain your current weight.  Limit how much fluid you drink. Ask your health care provider or dietitian how much fluid you can have each day.  Limit or avoid alcohol as told by your health  care provider or dietitian. Recommended foods Fruits All fresh, frozen, and canned fruits. Dried fruits, such as raisins, prunes, and cranberries. Vegetables All fresh vegetables. Vegetables that are frozen without sauce or added salt. Low-sodium or sodium-free canned vegetables. Grains Bread with less than 80 mg of sodium per slice. Whole-wheat pasta, quinoa, and brown rice. Oats and oatmeal. Barley. [place]. Grits and cream of wheat. Whole-grain and whole-wheat cold cereal. Meats and other protein foods Lean cuts of meat. Skinless chicken and [place]. Fish with high omega-3 fatty acids, such as salmon, sardines, and other cold-water fishes. Eggs. Dried beans, peas, and edamame. Unsalted nuts and nut butters. Dairy Low-fat or nonfat (skim) milk and dried milk. Rice milk, soy milk, and almond milk. Low-fat or nonfat yogurt. Small amounts of reduced-sodium block cheese. Low-sodium cottage cheese. Fats and oils Olive, canola, soybean, flaxseed, avocado, or sunflower oil. Sweets and desserts Applesauce. Granola bars. Sugar-free pudding and gelatin. Frozen fruit bars. Seasoning and other foods Fresh and dried herbs. Lemon or lime juice. Vinegar. Low-sodium ketchup. Salt-free marinades, salad dressings, sauces, and seasonings. The items listed above may not be a complete list of foods and beverages you can eat. Contact a dietitian for more information. Foods to avoid Fruits Fruits that are dried with sodium-containing preservatives. Vegetables Canned vegetables. Frozen vegetables with sauce or seasonings. Creamed vegetables. [place] fries. Onion rings. Pickled vegetables and sauerkraut. Grains Bread with more than 80 mg of sodium per slice. Hot or cold cereal with more than 140 mg sodium per serving. Salted pretzels and crackers. Prepackaged breadcrumbs. Bagels, croissants, and biscuits. Meats and other protein foods Ribs and chicken wings. Bacon, ham, pepperoni, bologna, salami, and packaged  luncheon meats. Hot dogs, bratwurst, and sausage. Canned meat. Smoked meat and fish. Salted nuts and seeds. Dairy Whole milk, half-and-half, and cream. Buttermilk. Processed cheese, cheese spreads, and cheese curds. Regular cottage cheese. Feta cheese. Shredded cheese. String cheese. Fats and oils Butter, lard, shortening, ghee, and bacon fat. Canned and packaged gravies. Seasoning and other foods Onion salt, garlic salt, table salt, and sea salt. Marinades. Regular salad dressings. Relishes, pickles, and olives. Meat flavorings and tenderizers, and bouillon cubes. Horseradish, ketchup, and mustard. Worcestershire sauce. Teriyaki sauce, soy sauce (including reduced sodium). Hot sauce and Tabasco sauce. Steak sauce, fish sauce, oyster sauce, and cocktail sauce. Taco seasonings. Barbecue sauce. Tartar sauce. The items listed above may not be a complete list of foods and beverages you should avoid. Contact a dietitian for more information. Summary  A heart failure eating plan includes changes that limit your intake of sodium and unhealthy fat, and it may help you lose weight or maintain a healthy weight. Your health care provider may also recommend limiting how much fluid you drink.  Most people with heart failure should eat no more than 2,300 mg of salt (sodium) a day. The amount of sodium that is recommended for you may be lower, depending on your condition.  Contact your health care  provider or dietitian before making any major changes to your diet. This information is not intended to replace advice given to you by your health care provider. Make sure you discuss any questions you have with your health care provider. Document Revised: 06/18/2020 Document Reviewed: 06/18/2020 Elsevier Patient Education  2021 [employer].

## 2020-12-20 NOTE — Progress Notes (Signed)
Established patient visit   Patient: Helen Ramsey   DOB: August 18, 1933   87 y.o. Female  MRN: [id number] Visit Date: 12/20/2020  Today's healthcare provider: Mar Daring, PA-C   Chief Complaint  Patient presents with  . Shortness of Breath  . Edema   Subjective    HPI   Helen Ramsey is an 87 yr old female that presents today for f/u of edema, HTN, and SOB. Overall she reports she is feeling well. Minor complaints but having a good day today.   -----------------------------------------------------------------------------------------   Patient Active Problem List   Diagnosis Date Noted  . Recurrent major depressive disorder, in partial remission ([place]) 02/27/2020  . Bilateral myofascial pain 06/01/2019  . Bilateral occipital neuralgia 06/01/2019  . Age-related osteoporosis without current pathological fracture 08/16/2018  . Gait instability 08/16/2018  . Blurred vision, right eye 08/16/2018  . Ocular pain, right eye 08/16/2018  . Dorsalgia 03/31/2016  . H/O total knee replacement 02/14/2016  . Systolic ejection murmur 65/78/4696  . Vitamin D deficiency 11/29/2015  . Allergy to other foods 11/29/2015  . Leg cramps 10/24/2015  . Cramp and spasm 10/24/2015  . Knee pain, bilateral 08/29/2015  . Sleep apnea 05/30/2015  . Restless legs syndrome 05/09/2015  . Allergic rhinitis 03/28/2015  . Anemia, unspecified 03/28/2015  . Anxiety and depression 03/28/2015  . Neck pain 03/28/2015  . Chronic headache 03/28/2015  . DDD (degenerative disc disease), lumbar 03/28/2015  . Gastro-esophageal reflux disease without esophagitis 03/28/2015  . 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 Essential (primary) hypertension 03/28/2015  . Adaptive colitis 03/28/2015  . OP (osteoporosis) 03/28/2015  . Nasal septal perforation 03/28/2015  . Spinal stenosis 03/28/2015  . Gastric ulcer without hemorrhage or perforation 03/28/2015  . Cerebral artery  occlusion with cerebral infarction ([place]) 03/28/2015  . Chronic sinusitis 03/28/2015  . Connective tissue and disc stenosis of intervertebral foramina of abdomen and other regions 03/28/2015  . Diverticulosis of large intestine without perforation or abscess without bleeding 03/28/2015  . Osteoarthritis 03/28/2015  . Other specified functional intestinal disorders 03/28/2015  . Hardening of the aorta (main artery of the heart) ([place]) 07/13/2014  . H/O adenomatous polyp of colon 06/27/2005   Past Medical History:  Diagnosis Date  . Allergic rhinitis, cause unspecified   . Allergic rhinitis, cause unspecified   . Atherosclerosis of aorta ([place])   . Back pain    lumbar  . Degeneration of lumbar or lumbosacral intervertebral disc   . Diverticulitis of colon (without mention of hemorrhage)(562.11)   . Dizziness and giddiness   . Dysthymic disorder   . Headache(784.0)   . Myalgia and myositis, unspecified   . Osteoporosis, unspecified   . Other and unspecified hyperlipidemia   . Other diseases of nasal cavity and sinuses(478.19)   . Other specified disorders of bladder   . Syncope   . Trigger finger (acquired)   . Unspecified essential hypertension   . Unspecified sleep apnea   . Urinary frequency        Medications: Outpatient Medications Prior to Visit  Medication Sig  . acetaminophen (TYLENOL) 500 MG tablet Take 500 mg by mouth in the morning and at bedtime.   . ALPRAZolam (XANAX) 0.25 MG tablet TAKE ONE TABLET BY MOUTH TWO TIMES A DAY AS NEEDED FOR ANXIETY  . aspirin EC 81 MG tablet Take 81 mg by mouth daily.  Marland Kitchen atorvastatin (LIPITOR) 20 MG tablet TAKE 1 TABLET BY MOUTH EVERY DAY AT 6:00 PM  .  Calcium Carbonate-Vitamin D 600-400 MG-UNIT tablet Take 1 tablet by mouth 2 (two) times daily.  . cetirizine (ZYRTEC) 10 MG tablet Take 10 mg daily by mouth.  . citalopram (CELEXA) 40 MG tablet TAKE 1 TABLET BY MOUTH EVERY DAY  . cyanocobalamin 1000 MCG tablet Take 1,000 mcg by mouth  daily.  . Dentifrices (FLUORIDE TOOTHPASTE DT) Place onto teeth daily.   . famotidine (PEPCID) 20 MG tablet TAKE 1 TABLET BY MOUTH DAILY AS NEEDED FOR HEARTBURN OR INDIGESTION  . furosemide (LASIX) 20 MG tablet Take 1 tablet (20 mg total) by mouth daily. May take extra tab if weight up greater 3 pounds  . Galcanezumab-gnlm (EMGALITY [place]) Inject 300 mg as directed every 30 (thirty) days.   Marland Kitchen lisinopril (ZESTRIL) 5 MG tablet TAKE 1 TABLET BY MOUTH DAILY  . melatonin 5 MG TABS Take 5 mg by mouth at bedtime.   . Omega-3 Fatty Acids (FISH OIL) 1200 MG CAPS Take 2 capsules by mouth daily.   Marland Kitchen omeprazole (PRILOSEC) 20 MG capsule TAKE 1 CAPSULE(20 MG) BY MOUTH DAILY  . Polyethylene Glycol 3350 (MIRALAX PO) Take by mouth.  . potassium gluconate 595 (99 K) MG TABS tablet Take 1 tablet (595 mg total) by mouth 2 (two) times daily.  . sodium chloride (OCEAN) 0.65 % nasal spray Place 1 spray into the nose as needed.   . traMADol (ULTRAM) 50 MG tablet TAKE 1 TABLET BY MOUTH TWICE DAILY AS NEEDED  . gabapentin (NEURONTIN) 100 MG capsule Start Gabapentin 100 mg (one tablet) in the morning, 100 mg in the afternoon (one tablet), and 200 mg (two tablets) in the evening. (Patient taking differently: Take 200 mg by mouth 3 (three) times daily. Start Gabapentin 200 mg (one tablet) in the morning, 200 mg in the afternoon (one tablet), and 300 mg in the evening.)  . MAGNESIUM GLYCINATE PO Take 500 mg by mouth daily.  . phenazopyridine (AZO-TABS) 95 MG tablet Take 1 tablet (95 mg total) by mouth as needed. (Patient not taking: Reported on 12/20/2020)  . [DISCONTINUED] Magnesium Hydroxide (MILK OF MAGNESIA PO) Take by mouth as needed.   No facility-administered medications prior to visit.    Review of Systems  Constitutional: Positive for fatigue.  Respiratory: Positive for shortness of breath (DOE). Negative for cough, chest tightness and wheezing.   Cardiovascular: Negative.  Negative for chest pain, palpitations and  leg swelling.  Neurological: Positive for weakness.    Last CBC Lab Results  Component Value Date   WBC 6.1 12/20/2020   HGB 12.0 12/20/2020   HCT 34.6 12/20/2020   MCV 89 12/20/2020   MCH 31.0 12/20/2020   RDW 12.1 12/20/2020   PLT 222 66/44/0347   Last metabolic panel Lab Results  Component Value Date   GLUCOSE 109 (H) 12/20/2020   NA 134 12/20/2020   K 4.8 12/20/2020   CL 96 12/20/2020   CO2 24 12/20/2020   BUN 11 12/20/2020   CREATININE 0.83 12/20/2020   GFRNONAA 65 12/20/2020   GFRAA 74 12/20/2020   CALCIUM 9.5 12/20/2020   PROT 6.6 10/15/2020   ALBUMIN 4.2 10/15/2020   LABGLOB 2.4 10/15/2020   AGRATIO 1.8 10/15/2020   BILITOT 0.5 10/15/2020   ALKPHOS 62 10/15/2020   AST 24 10/15/2020   ALT 25 10/15/2020   ANIONGAP 7 09/28/2018       Objective    BP (!) 157/73 (BP Location: Left Arm, Patient Position: Sitting, Cuff Size: Large)   Pulse 67   Temp 98.3  F (36.8 C) (Oral)   Wt 182 lb (82.6 kg)   BMI 36.76 kg/m  BP Readings from Last 3 Encounters:  12/20/20 (!) 157/73  10/09/20 110/63  09/27/20 128/64   Wt Readings from Last 3 Encounters:  12/20/20 182 lb (82.6 kg)  11/23/20 181 lb 3.2 oz (82.2 kg)  10/09/20 183 lb 6.4 oz (83.2 kg)       Physical Exam Vitals reviewed.  Constitutional:      General: She is not in acute distress.    Appearance: She is well-developed and well-nourished. She is obese. She is not ill-appearing or diaphoretic.  Neck:     Thyroid: No thyromegaly.     Vascular: No JVD.     Trachea: No tracheal deviation.  Cardiovascular:     Rate and Rhythm: Normal rate and regular rhythm.     Heart sounds: Normal heart sounds. No murmur heard. No friction rub. No gallop.   Pulmonary:     Effort: Pulmonary effort is normal. No respiratory distress.     Breath sounds: Normal breath sounds. No wheezing or rales.  Musculoskeletal:     Cervical back: Normal range of motion and neck supple.     Right lower leg: Edema (trace) present.      Left lower leg: Edema (trace) present.  Lymphadenopathy:     Cervical: No cervical adenopathy.  Neurological:     Mental Status: She is alert.      Results for orders placed or performed in visit on 64/40/34  Basic Metabolic Panel (BMET)  Result Value Ref Range   Glucose 109 (H) 65 - 99 mg/dL   BUN 11 8 - 27 mg/dL   Creatinine, Ser 0.83 0.57 - 1.00 mg/dL   GFR calc non Af Amer 65 >59 mL/min/1.73   GFR calc Af Amer 74 >59 mL/min/1.73   BUN/Creatinine Ratio 13 12 - 28   Sodium 134 134 - 144 mmol/L   Potassium 4.8 3.5 - 5.2 mmol/L   Chloride 96 96 - 106 mmol/L   CO2 24 20 - 29 mmol/L   Calcium 9.5 8.7 - 10.3 mg/dL  B Nat Peptide  Result Value Ref Range   BNP 33.0 0.0 - 100.0 pg/mL  Magnesium  Result Value Ref Range   Magnesium 2.2 1.6 - 2.3 mg/dL  CBC w/Diff/Platelet  Result Value Ref Range   WBC 6.1 3.4 - 10.8 x10E3/uL   RBC 3.87 3.77 - 5.28 x10E6/uL   Hemoglobin 12.0 11.1 - 15.9 g/dL   Hematocrit 34.6 34.0 - 46.6 %   MCV 89 79 - 97 fL   MCH 31.0 26.6 - 33.0 pg   MCHC 34.7 31.5 - 35.7 g/dL   RDW 12.1 11.7 - 15.4 %   Platelets 222 150 - 450 x10E3/uL   Neutrophils 57 Not Estab. %   Lymphs 19 Not Estab. %   Monocytes 11 Not Estab. %   Eos 11 Not Estab. %   Basos 1 Not Estab. %   Neutrophils Absolute 3.5 1.4 - 7.0 x10E3/uL   Lymphocytes Absolute 1.2 0.7 - 3.1 x10E3/uL   Monocytes Absolute 0.7 0.1 - 0.9 x10E3/uL   EOS (ABSOLUTE) 0.7 (H) 0.0 - 0.4 x10E3/uL   Basophils Absolute 0.0 0.0 - 0.2 x10E3/uL   Immature Granulocytes 1 Not Estab. %   Immature Grans (Abs) 0.0 0.0 - 0.1 x10E3/uL    Assessment & Plan     1. Essential (primary) hypertension Stable. Continue current medical treatment plan.   2. Anxiety and depression  Stable. Continue current medical treatment plan.   3. SOB (shortness of breath) Will check labs as below to evaluate for possible heart failure. Will f/u pending results.  - Basic Metabolic Panel (BMET) - B Nat Peptide - CBC  w/Diff/Platelet - fluticasone furoate-vilanterol (BREO ELLIPTA) 100-25 MCG/INH AEPB; Inhale 1 puff into the lungs daily.  Dispense: 60 each; Refill: 0  4. Cough See above medical treatment plan. Add Breo for asthma.  - Basic Metabolic Panel (BMET) - B Nat Peptide - CBC w/Diff/Platelet - fluticasone furoate-vilanterol (BREO ELLIPTA) 100-25 MCG/INH AEPB; Inhale 1 puff into the lungs daily.  Dispense: 60 each; Refill: 0  5. Muscle cramp Will check labs as below and f/u pending results. - Magnesium   No follow-ups on file.      Reynolds Bowl, PA-C, have reviewed all documentation for this visit. The documentation on 01/08/21 for the exam, diagnosis, procedures, and orders are all accurate and complete.   Rubye Beach  [facility] [phone] (phone) [phone] (fax)  [place]

## 2020-12-21 LAB — CBC WITH DIFFERENTIAL/PLATELET
Basophils Absolute: 0 10*3/uL (ref 0.0–0.2)
Basos: 1 %
EOS (ABSOLUTE): 0.7 10*3/uL — ABNORMAL HIGH (ref 0.0–0.4)
Eos: 11 %
Hematocrit: 34.6 % (ref 34.0–46.6)
Hemoglobin: 12 g/dL (ref 11.1–15.9)
Immature Grans (Abs): 0 10*3/uL (ref 0.0–0.1)
Immature Granulocytes: 1 %
Lymphocytes Absolute: 1.2 10*3/uL (ref 0.7–3.1)
Lymphs: 19 %
MCH: 31 pg (ref 26.6–33.0)
MCHC: 34.7 g/dL (ref 31.5–35.7)
MCV: 89 fL (ref 79–97)
Monocytes Absolute: 0.7 10*3/uL (ref 0.1–0.9)
Monocytes: 11 %
Neutrophils Absolute: 3.5 10*3/uL (ref 1.4–7.0)
Neutrophils: 57 %
Platelets: 222 10*3/uL (ref 150–450)
RBC: 3.87 x10E6/uL (ref 3.77–5.28)
RDW: 12.1 % (ref 11.7–15.4)
WBC: 6.1 10*3/uL (ref 3.4–10.8)

## 2020-12-21 LAB — BASIC METABOLIC PANEL
BUN/Creatinine Ratio: 13 (ref 12–28)
BUN: 11 mg/dL (ref 8–27)
CO2: 24 mmol/L (ref 20–29)
Calcium: 9.5 mg/dL (ref 8.7–10.3)
Chloride: 96 mmol/L (ref 96–106)
Creatinine, Ser: 0.83 mg/dL (ref 0.57–1.00)
GFR calc Af Amer: 74 mL/min/{1.73_m2} (ref >59)
GFR calc non Af Amer: 65 mL/min/{1.73_m2} (ref >59)
Glucose: 109 mg/dL — ABNORMAL HIGH (ref 65–99)
Potassium: 4.8 mmol/L (ref 3.5–5.2)
Sodium: 134 mmol/L (ref 134–144)

## 2020-12-21 LAB — BRAIN NATRIURETIC PEPTIDE: BNP: 33 pg/mL (ref 0.0–100.0)

## 2020-12-21 LAB — MAGNESIUM: Magnesium: 2.2 mg/dL (ref 1.6–2.3)

## 2020-12-26 ENCOUNTER — Other Ambulatory Visit: Payer: Self-pay | Admitting: Physician Assistant

## 2020-12-26 DIAGNOSIS — Z1231 Encounter for screening mammogram for malignant neoplasm of breast: Secondary | ICD-10-CM

## 2021-01-07 ENCOUNTER — Ambulatory Visit: Payer: Medicare Other | Admitting: Physician Assistant

## 2021-01-08 ENCOUNTER — Encounter: Payer: Self-pay | Admitting: Physician Assistant

## 2021-01-09 ENCOUNTER — Other Ambulatory Visit: Payer: Self-pay

## 2021-01-09 ENCOUNTER — Encounter: Payer: Self-pay | Admitting: Physician Assistant

## 2021-01-09 ENCOUNTER — Ambulatory Visit (INDEPENDENT_AMBULATORY_CARE_PROVIDER_SITE_OTHER): Payer: Medicare Other | Admitting: Physician Assistant

## 2021-01-09 VITALS — BP 123/51 | HR 68 | Temp 98.6°F | Wt 176.0 lb

## 2021-01-09 DIAGNOSIS — R600 Localized edema: Secondary | ICD-10-CM

## 2021-01-09 DIAGNOSIS — R059 Cough, unspecified: Secondary | ICD-10-CM

## 2021-01-09 DIAGNOSIS — G629 Polyneuropathy, unspecified: Secondary | ICD-10-CM | POA: Diagnosis not present

## 2021-01-09 DIAGNOSIS — R0602 Shortness of breath: Secondary | ICD-10-CM

## 2021-01-09 MED ORDER — GABAPENTIN 100 MG PO CAPS: ORAL_CAPSULE | ORAL | 1 refills | Status: DC

## 2021-01-09 MED ORDER — FUROSEMIDE 20 MG PO TABS: 40.0000 mg | ORAL_TABLET | Freq: Every day | ORAL | 1 refills | Status: DC

## 2021-01-09 MED ORDER — FLUTICASONE FUROATE-VILANTEROL 100-25 MCG/INH IN AEPB: 1.0000 | INHALATION_SPRAY | Freq: Every day | RESPIRATORY_TRACT | 1 refills | Status: DC

## 2021-01-09 NOTE — Progress Notes (Signed)
Established patient visit   Patient: Helen Ramsey   DOB: 1933/11/03   87 y.o. Female  MRN: [id number] Visit Date: 01/09/2021  Today's healthcare provider: Mar Daring, PA-C   Chief Complaint  Patient presents with  . Shortness of Breath   Subjective    HPI  Follow up for Shortness of Breath  The patient was last seen for this 1 months ago. Changes made at last visit include starting [facility].  She reports excellent compliance with treatment. She feels that condition is Improved. She is not having side effects.   ----------------------------------------------------------------------------------------- Hypertension, follow-up  BP Readings from Last 3 Encounters:  01/09/21 (!) 123/51  12/20/20 (!) 157/73  10/09/20 110/63   Wt Readings from Last 3 Encounters:  01/09/21 176 lb (79.8 kg)  12/20/20 182 lb (82.6 kg)  11/23/20 181 lb 3.2 oz (82.2 kg)     She was last seen for hypertension 4 weeks ago.  Management since that visit includes no changes.  She reports excellent compliance with treatment. She is not having side effects.  She is following a Low Sodium diet. She is not exercising. She does not smoke.  Use of agents associated with hypertension: none.   Outside blood pressures are 120's/50-60's. Symptoms: No chest pain No chest pressure  No palpitations No syncope  No dyspnea No orthopnea  No paroxysmal nocturnal dyspnea Yes lower extremity edema   Pertinent labs: Lab Results  Component Value Date   CHOL 169 02/27/2020   HDL 41 02/27/2020   LDLCALC 102 (H) 02/27/2020   TRIG 146 02/27/2020   CHOLHDL 3.0 12/27/2018   Lab Results  Component Value Date   NA 134 12/20/2020   K 4.8 12/20/2020   CREATININE 0.83 12/20/2020   GFRNONAA 65 12/20/2020   GFRAA 74 12/20/2020   GLUCOSE 109 (H) 12/20/2020     The ASCVD Risk score (Goff DC Jr., et al., 2013) failed to calculate for the following reasons:   The 2013 ASCVD risk score is only  valid for ages 90 to 36   The patient has a prior MI or stroke diagnosis   ---------------------------------------------------------------------------------------------------    Patient Active Problem List   Diagnosis Date Noted  . Recurrent major depressive disorder, in partial remission ([place]) 02/27/2020  . Bilateral myofascial pain 06/01/2019  . Bilateral occipital neuralgia 06/01/2019  . Age-related osteoporosis without current pathological fracture 08/16/2018  . Gait instability 08/16/2018  . Blurred vision, right eye 08/16/2018  . Ocular pain, right eye 08/16/2018  . Dorsalgia 03/31/2016  . H/O total knee replacement 02/14/2016  . Systolic ejection murmur 63/33/5456  . Vitamin D deficiency 11/29/2015  . Allergy to other foods 11/29/2015  . Leg cramps 10/24/2015  . Cramp and spasm 10/24/2015  . Knee pain, bilateral 08/29/2015  . Sleep apnea 05/30/2015  . Restless legs syndrome 05/09/2015  . Allergic rhinitis 03/28/2015  . Anemia, unspecified 03/28/2015  . Anxiety and depression 03/28/2015  . Neck pain 03/28/2015  . Chronic headache 03/28/2015  . DDD (degenerative disc disease), lumbar 03/28/2015  . Gastro-esophageal reflux disease without esophagitis 03/28/2015  . 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 Essential (primary) hypertension 03/28/2015  . Adaptive colitis 03/28/2015  . OP (osteoporosis) 03/28/2015  . Nasal septal perforation 03/28/2015  . Spinal stenosis 03/28/2015  . Gastric ulcer without hemorrhage or perforation 03/28/2015  . Cerebral artery occlusion with cerebral infarction ([place]) 03/28/2015  . Chronic sinusitis 03/28/2015  . Connective tissue and disc stenosis of intervertebral foramina  of abdomen and other regions 03/28/2015  . Diverticulosis of large intestine without perforation or abscess without bleeding 03/28/2015  . Osteoarthritis 03/28/2015  . Other specified functional intestinal disorders 03/28/2015  . Hardening of  the aorta (main artery of the heart) ([place]) 07/13/2014  . H/O adenomatous polyp of colon 06/27/2005   Past Medical History:  Diagnosis Date  . Allergic rhinitis, cause unspecified   . Allergic rhinitis, cause unspecified   . Atherosclerosis of aorta ([place])   . Back pain    lumbar  . Degeneration of lumbar or lumbosacral intervertebral disc   . Diverticulitis of colon (without mention of hemorrhage)(562.11)   . Dizziness and giddiness   . Dysthymic disorder   . Headache(784.0)   . Myalgia and myositis, unspecified   . Osteoporosis, unspecified   . Other and unspecified hyperlipidemia   . Other diseases of nasal cavity and sinuses(478.19)   . Other specified disorders of bladder   . Syncope   . Trigger finger (acquired)   . Unspecified essential hypertension   . Unspecified sleep apnea   . Urinary frequency    Social History   Tobacco Use  . Smoking status: Never Smoker  . Smokeless tobacco: Never Used  Vaping Use  . Vaping Use: Never used  Substance Use Topics  . Alcohol use: No  . Drug use: No   Allergies  Allergen Reactions  . Amoxicillin   . Clindamycin/Lincomycin   . Doxycycline   . Levaquin [Levofloxacin In D5w]   . Mirabegron Nausea And Vomiting  . Nsaids Other (See Comments)    GI upset  . Oxycodone Nausea Only    Other reaction(s): Hallucination  . Phenergan [Promethazine Hcl]   . Singulair [Montelukast]   . Vicodin [Hydrocodone-Acetaminophen]   . Zocor [Simvastatin]   . Zoloft  [Sertraline] Other (See Comments)  . Ampicillin Rash  . Bactrim [Sulfamethoxazole-Trimethoprim] Rash  . Penicillins Rash    Ampicillin, Clindamycin, Doxycycline Levaquin-severe headache, muscle and joint aches, weak  . Tolmetin     Other reaction(s): Other (See Comments) GI upset     Medications: Outpatient Medications Prior to Visit  Medication Sig  . acetaminophen (TYLENOL) 500 MG tablet Take 500 mg by mouth in the morning and at bedtime.   . ALPRAZolam (XANAX) 0.25 MG  tablet TAKE ONE TABLET BY MOUTH TWO TIMES A DAY AS NEEDED FOR ANXIETY  . aspirin EC 81 MG tablet Take 81 mg by mouth daily.  Marland Kitchen atorvastatin (LIPITOR) 20 MG tablet TAKE 1 TABLET BY MOUTH EVERY DAY AT 6:00 PM  . Calcium Carbonate-Vitamin D 600-400 MG-UNIT tablet Take 1 tablet by mouth 2 (two) times daily.  . cetirizine (ZYRTEC) 10 MG tablet Take 10 mg daily by mouth.  . citalopram (CELEXA) 40 MG tablet TAKE 1 TABLET BY MOUTH EVERY DAY  . cyanocobalamin 1000 MCG tablet Take 1,000 mcg by mouth daily.  . Dentifrices (FLUORIDE TOOTHPASTE DT) Place onto teeth daily.   . famotidine (PEPCID) 20 MG tablet TAKE 1 TABLET BY MOUTH DAILY AS NEEDED FOR HEARTBURN OR INDIGESTION  . fluticasone furoate-vilanterol (BREO ELLIPTA) 100-25 MCG/INH AEPB Inhale 1 puff into the lungs daily.  . furosemide (LASIX) 20 MG tablet Take 1 tablet (20 mg total) by mouth daily. May take extra tab if weight up greater 3 pounds  . gabapentin (NEURONTIN) 100 MG capsule Start Gabapentin 100 mg (one tablet) in the morning, 100 mg in the afternoon (one tablet), and 200 mg (two tablets) in the evening. (Patient taking differently: Take  200 mg by mouth 3 (three) times daily. Start Gabapentin 200 mg (one tablet) in the morning, 200 mg in the afternoon (one tablet), and 300 mg in the evening.)  . Galcanezumab-gnlm (EMGALITY [place]) Inject 300 mg as directed every 30 (thirty) days.   Marland Kitchen lisinopril (ZESTRIL) 5 MG tablet TAKE 1 TABLET BY MOUTH DAILY  . MAGNESIUM GLYCINATE PO Take 500 mg by mouth daily.  . melatonin 5 MG TABS Take 5 mg by mouth at bedtime.   . Omega-3 Fatty Acids (FISH OIL) 1200 MG CAPS Take 2 capsules by mouth daily.   Marland Kitchen omeprazole (PRILOSEC) 20 MG capsule TAKE 1 CAPSULE(20 MG) BY MOUTH DAILY  . Polyethylene Glycol 3350 (MIRALAX PO) Take by mouth.  . potassium gluconate 595 (99 K) MG TABS tablet Take 1 tablet (595 mg total) by mouth 2 (two) times daily.  . sodium chloride (OCEAN) 0.65 % nasal spray Place 1 spray into the nose as  needed.   . traMADol (ULTRAM) 50 MG tablet TAKE 1 TABLET BY MOUTH TWICE DAILY AS NEEDED  . phenazopyridine (AZO-TABS) 95 MG tablet Take 1 tablet (95 mg total) by mouth as needed. (Patient not taking: No sig reported)   No facility-administered medications prior to visit.    Review of Systems  Constitutional: Negative.   Respiratory: Positive for shortness of breath (Pt states this has improved some.). Negative for apnea, cough, choking, chest tightness, wheezing and stridor.   Cardiovascular: Positive for leg swelling. Negative for chest pain and palpitations.  Gastrointestinal: Negative.   Neurological: Negative for dizziness, light-headedness and headaches.     Objective    BP (!) 123/51 (BP Location: Left Arm, Patient Position: Sitting, Cuff Size: Normal)   Pulse 68   Temp 98.6 F (37 C) (Oral)   Wt 176 lb (79.8 kg)   BMI 35.55 kg/m    Physical Exam Vitals reviewed.  Constitutional:      General: She is not in acute distress.    Appearance: She is well-developed and well-nourished. She is not ill-appearing or diaphoretic.  Neck:     Thyroid: No thyromegaly.     Vascular: No JVD.     Trachea: No tracheal deviation.  Cardiovascular:     Rate and Rhythm: Normal rate and regular rhythm.     Heart sounds: Normal heart sounds. No murmur heard. No friction rub. No gallop.   Pulmonary:     Effort: Pulmonary effort is normal. No respiratory distress.     Breath sounds: Normal breath sounds. No wheezing or rales.  Musculoskeletal:     Cervical back: Normal range of motion and neck supple.     Right lower leg: No edema.     Left lower leg: No edema.  Lymphadenopathy:     Cervical: No cervical adenopathy.  Skin:    Capillary Refill: Capillary refill takes less than 2 seconds.  Neurological:     Mental Status: She is alert.     No results found for any visits on 01/09/21.  Assessment & Plan     1. SOB (shortness of breath) Improved. Continue Breo as below. F/U in 3  months for chronic issues.  - fluticasone furoate-vilanterol (BREO ELLIPTA) 100-25 MCG/INH AEPB; Inhale 1 puff into the lungs daily.  Dispense: 180 each; Refill: 1  2. Cough See above medical treatment plan. - fluticasone furoate-vilanterol (BREO ELLIPTA) 100-25 MCG/INH AEPB; Inhale 1 puff into the lungs daily.  Dispense: 180 each; Refill: 1  3. Neuropathy Stable. Diagnosis pulled for medication refill.  Continue current medical treatment plan. - gabapentin (NEURONTIN) 100 MG capsule; Start Gabapentin 200 mg (one tablet) in the morning, 200 mg in the afternoon (one tablet), and 300 mg in the evening.  Dispense: 630 capsule; Refill: 1  4. Bilateral lower extremity edema Improved. Continue elevating legs and compression stockings. Decrease Lasix back to 40mg  daily. Add a third if weight is up or legs swollen.  - furosemide (LASIX) 20 MG tablet; Take 2 tablets (40 mg total) by mouth daily. May take extra tab if weight up greater 3 pounds  Dispense: 180 tablet; Refill: 1  No follow-ups on file.      Reynolds Bowl, PA-C, have reviewed all documentation for this visit. The documentation on 01/09/21 for the exam, diagnosis, procedures, and orders are all accurate and complete.   Rubye Beach  [facility] [phone] (phone) [phone] (fax)  [place]

## 2021-01-11 ENCOUNTER — Other Ambulatory Visit: Payer: Self-pay

## 2021-01-11 ENCOUNTER — Other Ambulatory Visit: Payer: Self-pay | Admitting: Physician Assistant

## 2021-01-11 DIAGNOSIS — E782 Mixed hyperlipidemia: Secondary | ICD-10-CM

## 2021-01-11 DIAGNOSIS — I10 Essential (primary) hypertension: Secondary | ICD-10-CM

## 2021-01-11 DIAGNOSIS — K219 Gastro-esophageal reflux disease without esophagitis: Secondary | ICD-10-CM

## 2021-01-11 DIAGNOSIS — F411 Generalized anxiety disorder: Secondary | ICD-10-CM

## 2021-01-11 MED ORDER — ATORVASTATIN CALCIUM 20 MG PO TABS: 20.0000 mg | ORAL_TABLET | Freq: Every day | ORAL | 1 refills | Status: DC

## 2021-01-11 MED ORDER — FAMOTIDINE 20 MG PO TABS: 20.0000 mg | ORAL_TABLET | Freq: Every day | ORAL | 1 refills | Status: DC

## 2021-01-11 MED ORDER — LISINOPRIL 5 MG PO TABS
ORAL_TABLET | ORAL | 1 refills | Status: DC
Start: 2021-01-11 — End: 2021-01-22

## 2021-01-11 NOTE — Telephone Encounter (Signed)
Please review

## 2021-01-11 NOTE — Telephone Encounter (Signed)
Welcome faxed refill request for the following medications:  famotidine (PEPCID) 20 MG tablet  lisinopril (ZESTRIL) 5 MG tablet  atorvastatin (LIPITOR) 20 MG tablet  Please advise.

## 2021-01-15 ENCOUNTER — Ambulatory Visit: Payer: Self-pay | Admitting: Physician Assistant

## 2021-01-15 NOTE — Telephone Encounter (Signed)
Pt reports BP at 1630 this evening 119/46  HR 67. States she checked it "Because I had a headache." Reports does not check daily, usual range 130's/ 60's.  Denies any dizziness, headache has resolved. States took all her scheduled meds this am which includes lisinopril , scheduled lasix "Then an extra one as I gained 1.5 lbs since yesterday Took a tramadol for the headache at 1500 and a Xanax at 1530.  Had pt check BP during call, 126/54  HR 67.  Pt. states "Feel better, I get nervous." Offered appt, pt states she would have to arrange transportation first.Reiewed HS meds. States will not take any additional tramadol or xanax.  Advised NT would route to practice for PCPs review and advice.  Advised to check BP in AM and call if low prior to taking meds. Care advise per protocol, per verbalizes understanding.   Assured PCP would review for final disposition. CB# [phone]  Reason for Disposition . [6] Fall in systolic BP > 20 mm Hg from normal AND [2] NOT dizzy, lightheaded, or weak  Answer Assessment - Initial Assessment Questions 1. BLOOD PRESSURE: "What is the blood pressure?" "Did you take at least two measurements 5 minutes apart?"     119/46 at 1630 2. ONSET: "When did you take your blood pressure?"     1630 today 3. HOW: "How did you obtain the blood pressure?" (e.g., visiting nurse, automatic home BP monitor)    Home monitor arm cuff 4. HISTORY: "Do you have a history of low blood pressure?" "What is your blood pressure normally?"     No. Running low though lately 5. MEDICATIONS: "Are you taking any medications for blood pressure?" If Yes, ask: "Have they been changed recently?"     Lisinopril, Also takes lasix 6. PULSE RATE: "Do you know what your pulse rate is?"      60 7. OTHER SYMPTOMS: "Have you been sick recently?" "Have you had a recent injury?"  Protocols used: BLOOD PRESSURE - LOW-A-AH

## 2021-01-21 ENCOUNTER — Ambulatory Visit: Payer: Self-pay | Admitting: *Deleted

## 2021-01-21 NOTE — Telephone Encounter (Signed)
Patient calls with Bilateral Lower Leg and ankle swelling that continues from February. Was seen in clinic on 01/09/21 and given new inhaler [facility] which she uses daily. Today's weight 179 lb and B/P 140/57, last night weight  was 178.8-took extra Lasix 20 mg tab yesterday afternoon with a weight of 178 lb. Describes breathing as no worse than her usually but now with raspy voice. Has mild chest tightness without wheezing-no worsening. Feet are always cool to touch bot not discolored.Has thigh high TEDs on. Sits in recliner but does not elevate her lower legs. Instructed on getting lower legs higher than her waist. Voided twice this morning.Voiced she has not been experiencing a big difference in output with the extra Lasix in afternoons.Appointment made for Tuesday with Helaine Chess. NP.Reviewed with patient, monitor output and weight, keep legs up high and with any worsening of symptoms seek treatment immediately.    Reason for Disposition . [1] MODERATE leg swelling (e.g., swelling extends up to knees) AND [2] new-onset or worsening  Answer Assessment - Initial Assessment Questions 1. ONSET: "When did the swelling start?" (e.g., minutes, hours, days)     Last week 2. LOCATION: "What part of the leg is swollen?"  "Are both legs swollen or just one leg?"     Knee to ankles 3. SEVERITY: "How bad is the swelling?" (e.g., localized; mild, moderate, severe)  - Localized - small area of swelling localized to one leg  - MILD pedal edema - swelling limited to foot and ankle, pitting edema < 1/4 inch (6 mm) deep, rest and elevation eliminate most or all swelling  - MODERATE edema - swelling of lower leg to knee, pitting edema > 1/4 inch (6 mm) deep, rest and elevation only partially reduce swelling  - SEVERE edema - swelling extends above knee, facial or hand swelling present      Moderate, says her voice is raspy. 4. REDNESS: "Does the swelling look red or infected?"     no 5. PAIN: "Is the swelling  painful to touch?" If Yes, ask: "How painful is it?"   (Scale 1-10; mild, moderate or severe)     Right knee hurts 6. FEVER: "Do you have a fever?" If Yes, ask: "What is it, how was it measured, and when did it start?"      no 7. CAUSE: "What do you think is causing the leg swelling?"     history 8. MEDICAL HISTORY: "Do you have a history of heart failure, kidney disease, liver failure, or cancer?"     unknown 9. RECURRENT SYMPTOM: "Have you had leg swelling before?" If Yes, ask: "When was the last time?" "What happened that time?"     Yes 10. OTHER SYMPTOMS: "Do you have any other symptoms?" (e.g., chest pain, difficulty breathing)       Feels a little tighter than usual. 11. PREGNANCY: "Is there any chance you are pregnant?" "When was your last menstrual period?"       na  Protocols used: LEG SWELLING AND EDEMA-A-AH

## 2021-01-22 ENCOUNTER — Other Ambulatory Visit: Payer: Self-pay

## 2021-01-22 ENCOUNTER — Ambulatory Visit
Admission: RE | Admit: 2021-01-22 | Discharge: 2021-01-22 | Disposition: A | Discharge status: Home / Self Care | Payer: Medicare Other | Attending: Adult Health | Admitting: Adult Health

## 2021-01-22 ENCOUNTER — Encounter: Payer: Self-pay | Admitting: Adult Health

## 2021-01-22 ENCOUNTER — Ambulatory Visit (INDEPENDENT_AMBULATORY_CARE_PROVIDER_SITE_OTHER): Payer: Medicare Other | Admitting: Adult Health

## 2021-01-22 ENCOUNTER — Ambulatory Visit
Admission: RE | Admit: 2021-01-22 | Discharge: 2021-01-22 | Disposition: A | Discharge status: Home / Self Care | Payer: Medicare Other | Source: Ambulatory Visit | Attending: Adult Health | Admitting: Adult Health

## 2021-01-22 VITALS — BP 126/49 | HR 60 | Resp 14 | Wt 178.8 lb

## 2021-01-22 DIAGNOSIS — M79604 Pain in right leg: Secondary | ICD-10-CM | POA: Insufficient documentation

## 2021-01-22 DIAGNOSIS — R609 Edema, unspecified: Secondary | ICD-10-CM | POA: Insufficient documentation

## 2021-01-22 DIAGNOSIS — R601 Generalized edema: Secondary | ICD-10-CM

## 2021-01-22 DIAGNOSIS — Z96651 Presence of right artificial knee joint: Secondary | ICD-10-CM | POA: Diagnosis not present

## 2021-01-22 DIAGNOSIS — R0602 Shortness of breath: Secondary | ICD-10-CM | POA: Diagnosis not present

## 2021-01-22 DIAGNOSIS — Z87898 Personal history of other specified conditions: Secondary | ICD-10-CM

## 2021-01-22 DIAGNOSIS — I10 Essential (primary) hypertension: Secondary | ICD-10-CM | POA: Diagnosis not present

## 2021-01-22 DIAGNOSIS — R0789 Other chest pain: Secondary | ICD-10-CM | POA: Diagnosis not present

## 2021-01-22 DIAGNOSIS — R059 Cough, unspecified: Secondary | ICD-10-CM | POA: Insufficient documentation

## 2021-01-22 DIAGNOSIS — Z8709 Personal history of other diseases of the respiratory system: Secondary | ICD-10-CM

## 2021-01-22 DIAGNOSIS — T733XXS Exhaustion due to excessive exertion, sequela: Secondary | ICD-10-CM | POA: Diagnosis not present

## 2021-01-22 DIAGNOSIS — M25561 Pain in right knee: Secondary | ICD-10-CM | POA: Diagnosis not present

## 2021-01-22 DIAGNOSIS — Z96641 Presence of right artificial hip joint: Secondary | ICD-10-CM | POA: Diagnosis not present

## 2021-01-22 MED ORDER — LISINOPRIL 2.5 MG PO TABS: ORAL_TABLET | ORAL | 0 refills | Status: DC

## 2021-01-22 NOTE — Progress Notes (Signed)
Chest x ray shows mild atelectasis of left middle lund and left base otherwise clear.  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  Heart size is within normal limits.   Will place order for echocardiogram to check heart valves and heart function.  Office should call you within 2 weeks to go for this ultrasound of the heart.  Continue Breo inhaler as directed.

## 2021-01-22 NOTE — Progress Notes (Signed)
Established patient visit   Patient: Helen Ramsey   DOB: [birth year]   87 y.o. Female  MRN: [id number] Visit Date: 01/22/2021  Today's healthcare provider: Marcille Buffy, FNP   Chief Complaint  Patient presents with  . Edema    Patient comes in office today with concerns of edema in her lower extremities and chest. Patient states that they previously saw PCP and was advised to increase Lasix  and take tablet at night if patient gained over 176. Patients daughter who is accompanied with her states that swelling appears to be worse.    Subjective    HPI HPI    Edema     Additional comments: Patient comes in office today with concerns of edema in her lower extremities and chest. Patient states that they previously saw PCP and was advised to increase Lasix  and take tablet at night if patient gained over 176. Patients daughter who is accompanied with her states that swelling appears to be worse.        Last edited by Minette Headland, CMA on 01/22/2021  1:15 PM. (History)      She saw Grace Bushy on 01/09/2021 and was increased furosemide (LASIX) 20 MG tablet; Take 2 tablets (40 mg total) by mouth daily. May take extra tab if weight up greater 3 pounds  Dispense: 180 tablet; Refill: 1 She was doing well with this dosage  and now she is taking Lasix 82m qd now. Improved. Continue Breo as below. F/U in 3 months for chronic issues.   Decrease Lasix back to 415mdaily. Add a third if weight is up or legs swollen.   Still using.  - fluticasone furoate-vilanterol (BREO ELLIPTA) 100-25 MCG/INH AEPB; Inhale 1 puff into the lungs daily.  Dispense: 180 each; Refill: 1    Medications: Outpatient Medications Prior to Visit  Medication Sig  . Cholecalciferol (VITAMIN D3 PO) Take 1,000 Units by mouth.  . Marland Kitchencetaminophen (TYLENOL) 500 MG tablet Take 500 mg by mouth in the morning and at bedtime.   . ALPRAZolam (XANAX) 0.25 MG tablet TAKE ONE TABLET BY MOUTH TWO TIMES A DAY AS  NEEDED FOR ANXIETY  . aspirin EC 81 MG tablet Take 81 mg by mouth daily.  . Marland Kitchentorvastatin (LIPITOR) 20 MG tablet Take 1 tablet (20 mg total) by mouth daily.  . Calcium Carbonate-Vitamin D 600-400 MG-UNIT tablet Take 1 tablet by mouth 2 (two) times daily.  . cetirizine (ZYRTEC) 10 MG tablet Take 10 mg daily by mouth.  . citalopram (CELEXA) 40 MG tablet TAKE 1 TABLET BY MOUTH EVERY DAY  . cyanocobalamin 1000 MCG tablet Take 1,000 mcg by mouth daily.  . Dentifrices (FLUORIDE TOOTHPASTE DT) Place onto teeth daily.   . Marland KitchenMGALITY, 300 MG DOSE, 100 MG/ML SOSY Inject into the skin.  . famotidine (PEPCID) 20 MG tablet Take 1 tablet (20 mg total) by mouth daily.  . fluticasone furoate-vilanterol (BREO ELLIPTA) 100-25 MCG/INH AEPB Inhale 1 puff into the lungs daily.  . furosemide (LASIX) 20 MG tablet Take 2 tablets (40 mg total) by mouth daily. May take extra tab if weight up greater 3 pounds  . gabapentin (NEURONTIN) 100 MG capsule Start Gabapentin 200 mg (one tablet) in the morning, 200 mg in the afternoon (one tablet), and 300 mg in the evening.  . Galcanezumab-gnlm (EMGALITY [place]) Inject 300 mg as directed every 30 (thirty) days.   . Marland KitchenAGNESIUM GLYCINATE PO Take 500 mg by mouth daily.  . melatonin  5 MG TABS Take 5 mg by mouth at bedtime.   . Omega-3 Fatty Acids (FISH OIL) 1200 MG CAPS Take 2 capsules by mouth daily.   Marland Kitchen omeprazole (PRILOSEC) 20 MG capsule TAKE 1 CAPSULE(20 MG) BY MOUTH DAILY  . phenazopyridine (AZO-TABS) 95 MG tablet Take 1 tablet (95 mg total) by mouth as needed.  . Polyethylene Glycol 3350 (MIRALAX PO) Take by mouth.  . potassium gluconate 595 (99 K) MG TABS tablet Take 1 tablet (595 mg total) by mouth 2 (two) times daily.  . sodium chloride (OCEAN) 0.65 % nasal spray Place 1 spray into the nose as needed.   . traMADol (ULTRAM) 50 MG tablet TAKE 1 TABLET BY MOUTH TWICE DAILY AS NEEDED  . [DISCONTINUED] lisinopril (ZESTRIL) 5 MG tablet TAKE 1 TABLET BY MOUTH DAILY   No  facility-administered medications prior to visit.    Review of Systems  Constitutional: Negative.   HENT: Negative.   Respiratory: Positive for shortness of breath. Negative for apnea, cough, choking, chest tightness, wheezing and stridor.   Cardiovascular: Negative.   Gastrointestinal: Negative.   Genitourinary: Negative.   Musculoskeletal: Positive for arthralgias.  Skin: Negative.   Neurological: Positive for weakness. Negative for tremors, facial asymmetry and headaches.  Psychiatric/Behavioral: Negative.        Objective    BP (!) 126/49   Pulse 60   Resp 14   Wt 178 lb 12.8 oz (81.1 kg)   SpO2 99%   BMI 36.11 kg/m  BP Readings from Last 3 Encounters:  01/22/21 (!) 126/49  01/09/21 (!) 123/51  12/20/20 (!) 157/73   Wt Readings from Last 3 Encounters:  01/22/21 178 lb 12.8 oz (81.1 kg)  01/09/21 176 lb (79.8 kg)  12/20/20 182 lb (82.6 kg)       Physical Exam Vitals reviewed.  Constitutional:      General: She is not in acute distress.    Appearance: She is well-developed. She is obese. She is not ill-appearing or diaphoretic.     Interventions: She is not intubated. HENT:     Head: Normocephalic and atraumatic.     Right Ear: External ear normal.     Left Ear: External ear normal.     Nose: Nose normal.     Mouth/Throat:     Pharynx: No oropharyngeal exudate.  Eyes:     General: Lids are normal. No scleral icterus.       Right eye: No discharge.        Left eye: No discharge.     Conjunctiva/sclera: Conjunctivae normal.     Right eye: Right conjunctiva is not injected. No exudate or hemorrhage.    Left eye: Left conjunctiva is not injected. No exudate or hemorrhage.    Pupils: Pupils are equal, round, and reactive to light.  Neck:     Thyroid: No thyroid mass or thyromegaly.     Vascular: Normal carotid pulses. No carotid bruit, hepatojugular reflux or JVD.     Trachea: Trachea and phonation normal. No tracheal tenderness or tracheal deviation.      Meningeal: Brudzinski's sign and Kernig's sign absent.  Cardiovascular:     Rate and Rhythm: Normal rate and regular rhythm.     Pulses: Normal pulses.          Radial pulses are 2+ on the right side and 2+ on the left side.       Dorsalis pedis pulses are 2+ on the right side and 2+ on the left side.  Posterior tibial pulses are 2+ on the right side and 2+ on the left side.     Heart sounds: Normal heart sounds, S1 normal and S2 normal. Heart sounds not distant. No murmur heard. No friction rub. No gallop.   Pulmonary:     Effort: Pulmonary effort is normal. No tachypnea, bradypnea, accessory muscle usage or respiratory distress. She is not intubated.     Breath sounds: Normal breath sounds. No stridor. No wheezing or rales.  Chest:     Chest wall: No tenderness.  Breasts:     Right: No supraclavicular adenopathy.     Left: No supraclavicular adenopathy.    Abdominal:     General: Bowel sounds are normal. There is no distension or abdominal bruit.     Palpations: Abdomen is soft. There is no shifting dullness, fluid wave, hepatomegaly, splenomegaly, mass or pulsatile mass.     Tenderness: There is no abdominal tenderness. There is no guarding or rebound.     Hernia: No hernia is present.  Musculoskeletal:        General: No tenderness or deformity. Normal range of motion.     Cervical back: Full passive range of motion without pain, normal range of motion and neck supple. No edema, erythema or rigidity. No spinous process tenderness or muscular tenderness. Normal range of motion.     Right lower leg: Edema present.     Left lower leg: Edema present.  Lymphadenopathy:     Head:     Right side of head: No submental, submandibular, tonsillar, preauricular, posterior auricular or occipital adenopathy.     Left side of head: No submental, submandibular, tonsillar, preauricular, posterior auricular or occipital adenopathy.     Cervical: No cervical adenopathy.     Right cervical: No  superficial, deep or posterior cervical adenopathy.    Left cervical: No superficial, deep or posterior cervical adenopathy.     Upper Body:     Right upper body: No supraclavicular or pectoral adenopathy.     Left upper body: No supraclavicular or pectoral adenopathy.  Skin:    General: Skin is warm and dry.     Coloration: Skin is not pale.     Findings: No abrasion, bruising, burn, ecchymosis, erythema, lesion, petechiae or rash.     Nails: There is no clubbing.  Neurological:     Mental Status: She is alert and oriented to person, place, and time.     GCS: GCS eye subscore is 4. GCS verbal subscore is 5. GCS motor subscore is 6.     Cranial Nerves: No cranial nerve deficit.     Sensory: No sensory deficit.     Motor: No tremor, atrophy, abnormal muscle tone or seizure activity.     Coordination: Coordination normal.     Gait: Gait normal.     Deep Tendon Reflexes: Reflexes are normal and symmetric. Reflexes normal. Babinski sign absent on the right side. Babinski sign absent on the left side.     Reflex Scores:      Tricep reflexes are 2+ on the right side and 2+ on the left side.      Bicep reflexes are 2+ on the right side and 2+ on the left side.      Brachioradialis reflexes are 2+ on the right side and 2+ on the left side.      Patellar reflexes are 2+ on the right side and 2+ on the left side.      Achilles reflexes are  2+ on the right side and 2+ on the left side. Psychiatric:        Mood and Affect: Mood normal.        Speech: Speech normal.        Behavior: Behavior normal.        Thought Content: Thought content normal.        Judgment: Judgment normal.      Results for orders placed or performed in visit on 01/22/21  B Nat Peptide  Result Value Ref Range   BNP 47.2 0.0 - 100.0 pg/mL  Comprehensive Metabolic Panel (CMET)  Result Value Ref Range   Glucose 104 (H) 65 - 99 mg/dL   BUN 23 8 - 27 mg/dL   Creatinine, Ser 1.07 (H) 0.57 - 1.00 mg/dL   eGFR 51 (L) >59  mL/min/1.73   BUN/Creatinine Ratio 21 12 - 28   Sodium 135 134 - 144 mmol/L   Potassium 4.5 3.5 - 5.2 mmol/L   Chloride 96 96 - 106 mmol/L   CO2 24 20 - 29 mmol/L   Calcium 9.6 8.7 - 10.3 mg/dL   Total Protein 6.6 6.0 - 8.5 g/dL   Albumin 4.1 3.6 - 4.6 g/dL   Globulin, Total 2.5 1.5 - 4.5 g/dL   Albumin/Globulin Ratio 1.6 1.2 - 2.2   Bilirubin Total 0.4 0.0 - 1.2 mg/dL   Alkaline Phosphatase 67 44 - 121 IU/L   AST 24 0 - 40 IU/L   ALT 22 0 - 32 IU/L  CBC with Differential/Platelet  Result Value Ref Range   WBC 6.7 3.4 - 10.8 x10E3/uL   RBC 3.75 (L) 3.77 - 5.28 x10E6/uL   Hemoglobin 11.8 11.1 - 15.9 g/dL   Hematocrit 34.7 34.0 - 46.6 %   MCV 93 79 - 97 fL   MCH 31.5 26.6 - 33.0 pg   MCHC 34.0 31.5 - 35.7 g/dL   RDW 12.2 11.7 - 15.4 %   Platelets 242 150 - 450 x10E3/uL   Neutrophils 56 Not Estab. %   Lymphs 17 Not Estab. %   Monocytes 11 Not Estab. %   Eos 15 Not Estab. %   Basos 1 Not Estab. %   Neutrophils Absolute 3.7 1.4 - 7.0 x10E3/uL   Lymphocytes Absolute 1.2 0.7 - 3.1 x10E3/uL   Monocytes Absolute 0.8 0.1 - 0.9 x10E3/uL   EOS (ABSOLUTE) 1.0 (H) 0.0 - 0.4 x10E3/uL   Basophils Absolute 0.1 0.0 - 0.2 x10E3/uL   Immature Granulocytes 0 Not Estab. %   Immature Grans (Abs) 0.0 0.0 - 0.1 x10E3/uL  Magnesium  Result Value Ref Range   Magnesium 2.3 1.6 - 2.3 mg/dL  TSH  Result Value Ref Range   TSH 6.870 (H) 0.450 - 4.500 uIU/mL    Assessment & Plan    Edema, unspecified type - Plan: B Nat Peptide, Comprehensive Metabolic Panel (CMET), CBC with Differential/Platelet, Magnesium, TSH, DG Chest 2 View, ECHOCARDIOGRAM COMPLETE, CANCELED: DG Chest 2 View  Cough - Plan: B Nat Peptide, DG Chest 2 View, ECHOCARDIOGRAM COMPLETE, CANCELED: DG Chest 2 View  Fatigue due to excessive exertion, sequela - Plan: TSH  Generalized edema  - Plan: B Nat Peptide, US Venous Img Lower Unilateral Right, DG Knee Complete 4 Views Right, ECHOCARDIOGRAM COMPLETE  Right leg pain - Plan: US  Venous Img Lower Unilateral Right, DG Knee Complete 4 Views Right  Essential (primary) hypertension - Plan: lisinopril (ZESTRIL) 2.5 MG tablet, ECHOCARDIOGRAM COMPLETE  History of dyspnea - Plan: ECHOCARDIOGRAM COMPLETE  Orders Placed This Encounter  Procedures  . US Venous Img Lower Unilateral Right  . DG Knee Complete 4 Views Right  . DG Chest 2 View  . B Nat Peptide  . Comprehensive Metabolic Panel (CMET)  . CBC with Differential/Platelet  . Magnesium  . TSH  . ECHOCARDIOGRAM COMPLETE  Bilateral lower extremity edema 2+ Increased Lasix 60 mg daily for 10 days. Recheck in office in 2 weeks sooner if needed.  Labs today.  Right anterior knee pain ultrasound to rule out dvt today. CXR. Consider Torsemide if no result with Lasix.   Red Flags discussed. The patient was given clear instructions to go to ER or return to medical center if any red flags develop, symptoms do not improve, worsen or new problems develop. They verbalized understanding.   Return in about 3 weeks (around 02/12/2021), or if symptoms worsen or fail to improve, for at any time for any worsening symptoms, Go to Emergency room/ urgent care if worse.         Marcille Buffy, [place] [phone] (phone) [phone] (fax)  [place]

## 2021-01-22 NOTE — Progress Notes (Signed)
Right knee x ray shows that the prosthetic components are in place and no fracture, dislocation, or joint swelling seen. No joint erosion is seen.  Recommend the DVT  and or popliteal cyst ultrasound that was ordered STAT today - they could not go today. Will result once  Be seen immediately if any symptoms worsen or change at anytime.

## 2021-01-22 NOTE — Patient Instructions (Addendum)
Meds ordered this encounter  Medications  . lisinopril (ZESTRIL) 2.5 MG tablet    Sig: TAKE 1 TABLET BY MOUTH DAILY    Dispense:  90 tablet    Refill:  0   Medications Discontinued During This Encounter  Medication Reason  . lisinopril (ZESTRIL) 5 MG tablet     Lasix increase to 60 mg daily for two weeks then go back to current dosage you have been on.  Stay hydrated.   Edema  Edema is when you have too much fluid in your body or under your skin. Edema may make your legs, feet, and ankles swell up. Swelling is also common in looser tissues, like around your eyes. This is a common condition. It gets more common as you get older. There are many possible causes of edema. Eating too much salt (sodium) and being on your feet or sitting for a long time can cause edema in your legs, feet, and ankles. Hot weather may make edema worse. Edema is usually painless. Your skin may look swollen or shiny. Follow these instructions at home:  Keep the swollen body part raised (elevated) above the level of your heart when you are sitting or lying down.  Do not sit still or stand for a long time.  Do not wear tight clothes. Do not wear garters on your upper legs.  Exercise your legs. This can help the swelling go down.  Wear elastic bandages or support stockings as told by your doctor.  Eat a low-salt (low-sodium) diet to reduce fluid as told by your doctor.  Depending on the cause of your swelling, you may need to limit how much fluid you drink (fluid restriction).  Take over-the-counter and prescription medicines only as told by your doctor. Contact a doctor if:  Treatment is not working.  You have heart, liver, or kidney disease and have symptoms of edema.  You have sudden and unexplained weight gain. Get help right away if:  You have shortness of breath or chest pain.  You cannot breathe when you lie down.  You have pain, redness, or warmth in the swollen areas.  You have heart,  liver, or kidney disease and get edema all of a sudden.  You have a fever and your symptoms get worse all of a sudden. Summary  Edema is when you have too much fluid in your body or under your skin.  Edema may make your legs, feet, and ankles swell up. Swelling is also common in looser tissues, like around your eyes.  Raise (elevate) the swollen body part above the level of your heart when you are sitting or lying down.  Follow your doctor's instructions about diet and how much fluid you can drink (fluid restriction). This information is not intended to replace advice given to you by your health care provider. Make sure you discuss any questions you have with your health care provider. Document Revised: 08/29/2020 Document Reviewed: 08/29/2020 Elsevier Patient Education  2021 [employer].

## 2021-01-23 ENCOUNTER — Ambulatory Visit
Admission: RE | Admit: 2021-01-23 | Discharge: 2021-01-23 | Disposition: A | Discharge status: Home / Self Care | Payer: Medicare Other | Source: Ambulatory Visit | Attending: Adult Health | Admitting: Adult Health

## 2021-01-23 DIAGNOSIS — R601 Generalized edema: Secondary | ICD-10-CM | POA: Insufficient documentation

## 2021-01-23 DIAGNOSIS — M79604 Pain in right leg: Secondary | ICD-10-CM | POA: Diagnosis not present

## 2021-01-23 DIAGNOSIS — R600 Localized edema: Secondary | ICD-10-CM | POA: Diagnosis not present

## 2021-01-23 LAB — CBC WITH DIFFERENTIAL/PLATELET
Basophils Absolute: 0.1 10*3/uL (ref 0.0–0.2)
Basos: 1 %
EOS (ABSOLUTE): 1 10*3/uL — ABNORMAL HIGH (ref 0.0–0.4)
Eos: 15 %
Hematocrit: 34.7 % (ref 34.0–46.6)
Hemoglobin: 11.8 g/dL (ref 11.1–15.9)
Immature Grans (Abs): 0 10*3/uL (ref 0.0–0.1)
Immature Granulocytes: 0 %
Lymphocytes Absolute: 1.2 10*3/uL (ref 0.7–3.1)
Lymphs: 17 %
MCH: 31.5 pg (ref 26.6–33.0)
MCHC: 34 g/dL (ref 31.5–35.7)
MCV: 93 fL (ref 79–97)
Monocytes Absolute: 0.8 10*3/uL (ref 0.1–0.9)
Monocytes: 11 %
Neutrophils Absolute: 3.7 10*3/uL (ref 1.4–7.0)
Neutrophils: 56 %
Platelets: 242 10*3/uL (ref 150–450)
RBC: 3.75 x10E6/uL — ABNORMAL LOW (ref 3.77–5.28)
RDW: 12.2 % (ref 11.7–15.4)
WBC: 6.7 10*3/uL (ref 3.4–10.8)

## 2021-01-23 LAB — COMPREHENSIVE METABOLIC PANEL
ALT: 22 IU/L (ref 0–32)
AST: 24 IU/L (ref 0–40)
Albumin/Globulin Ratio: 1.6 (ref 1.2–2.2)
Albumin: 4.1 g/dL (ref 3.6–4.6)
Alkaline Phosphatase: 67 IU/L (ref 44–121)
BUN/Creatinine Ratio: 21 (ref 12–28)
BUN: 23 mg/dL (ref 8–27)
Bilirubin Total: 0.4 mg/dL (ref 0.0–1.2)
CO2: 24 mmol/L (ref 20–29)
Calcium: 9.6 mg/dL (ref 8.7–10.3)
Chloride: 96 mmol/L (ref 96–106)
Creatinine, Ser: 1.07 mg/dL — ABNORMAL HIGH (ref 0.57–1.00)
Globulin, Total: 2.5 g/dL (ref 1.5–4.5)
Glucose: 104 mg/dL — ABNORMAL HIGH (ref 65–99)
Potassium: 4.5 mmol/L (ref 3.5–5.2)
Sodium: 135 mmol/L (ref 134–144)
Total Protein: 6.6 g/dL (ref 6.0–8.5)
eGFR: 51 mL/min/{1.73_m2} — ABNORMAL LOW (ref >59)

## 2021-01-23 LAB — TSH: TSH: 6.87 u[IU]/mL — ABNORMAL HIGH (ref 0.450–4.500)

## 2021-01-23 LAB — BRAIN NATRIURETIC PEPTIDE: BNP: 47.2 pg/mL (ref 0.0–100.0)

## 2021-01-23 LAB — MAGNESIUM: Magnesium: 2.3 mg/dL (ref 1.6–2.3)

## 2021-01-24 ENCOUNTER — Other Ambulatory Visit: Payer: Self-pay

## 2021-01-24 ENCOUNTER — Ambulatory Visit
Admission: RE | Admit: 2021-01-24 | Discharge: 2021-01-24 | Disposition: A | Discharge status: Home / Self Care | Payer: Medicare Other | Source: Ambulatory Visit | Attending: Physician Assistant | Admitting: Physician Assistant

## 2021-01-24 DIAGNOSIS — Z1231 Encounter for screening mammogram for malignant neoplasm of breast: Secondary | ICD-10-CM | POA: Diagnosis not present

## 2021-01-25 NOTE — Progress Notes (Signed)
No evidence of blood clot tight lower extremity.

## 2021-01-26 DIAGNOSIS — Z87898 Personal history of other specified conditions: Secondary | ICD-10-CM | POA: Insufficient documentation

## 2021-01-26 DIAGNOSIS — R609 Edema, unspecified: Secondary | ICD-10-CM | POA: Insufficient documentation

## 2021-01-26 DIAGNOSIS — R531 Weakness: Secondary | ICD-10-CM | POA: Insufficient documentation

## 2021-01-26 DIAGNOSIS — Z8709 Personal history of other diseases of the respiratory system: Secondary | ICD-10-CM | POA: Insufficient documentation

## 2021-01-26 DIAGNOSIS — R601 Generalized edema: Secondary | ICD-10-CM | POA: Insufficient documentation

## 2021-01-26 DIAGNOSIS — T733XXA Exhaustion due to excessive exertion, initial encounter: Secondary | ICD-10-CM | POA: Insufficient documentation

## 2021-01-26 DIAGNOSIS — R059 Cough, unspecified: Secondary | ICD-10-CM | POA: Insufficient documentation

## 2021-01-28 ENCOUNTER — Telehealth: Payer: Self-pay | Admitting: Physician Assistant

## 2021-01-28 NOTE — Telephone Encounter (Signed)
She could increase back to 5mg  now

## 2021-01-28 NOTE — Telephone Encounter (Signed)
Caller name: Lietzke,Kathy Relation to pt: daughter in law Call back number: [phone]   Reason for call:  Would like to speak with the nurse regarding patient today BP reading  152/67 and 151/61. Caller states BP medication was decrease to 2.5MG  and unsure if it should go back to 5 MG. Patient is experiencing head aches.  Please advise

## 2021-01-28 NOTE — Telephone Encounter (Signed)
Please advise 

## 2021-01-29 NOTE — Telephone Encounter (Signed)
Mickel Fuchs advised.   Thanks,   -Mickel Baas

## 2021-02-06 ENCOUNTER — Ambulatory Visit (INDEPENDENT_AMBULATORY_CARE_PROVIDER_SITE_OTHER): Payer: Medicare Other | Admitting: Physician Assistant

## 2021-02-06 ENCOUNTER — Other Ambulatory Visit: Payer: Self-pay

## 2021-02-06 DIAGNOSIS — E034 Atrophy of thyroid (acquired): Secondary | ICD-10-CM

## 2021-02-06 DIAGNOSIS — R197 Diarrhea, unspecified: Secondary | ICD-10-CM | POA: Diagnosis not present

## 2021-02-06 DIAGNOSIS — R609 Edema, unspecified: Secondary | ICD-10-CM | POA: Diagnosis not present

## 2021-02-06 NOTE — Progress Notes (Signed)
Virtual telephone visit    Virtual Visit via Telephone Note   This visit type was conducted due to national recommendations for restrictions regarding the COVID-19 Pandemic (e.g. social distancing) in an effort to limit this patient's exposure and mitigate transmission in our community. Due to her co-morbid illnesses, this patient is at least at moderate risk for complications without adequate follow up. This format is felt to be most appropriate for this patient at this time. The patient did not have access to video technology or had technical difficulties with video requiring transitioning to audio format only (telephone). Physical exam was limited to content and character of the telephone converstion.    Patient location: Home Provider location: [facility]  I discussed the limitations of evaluation and management by telemedicine and the availability of in person appointments. The patient expressed understanding and agreed to proceed.   Visit Date: 02/06/2021  Today's healthcare provider: Mar Daring, PA-C   Chief Complaint  Patient presents with  . Edema  . Diarrhea   Subjective    HPI  Helen Ramsey is an 87 yr old female presenting today via telephone with her daughter in law to discuss chronic issues and follow up for medications.   Overall she feels she is somewhat improved. She does note she has had more fatigue recently. Not doing as much as she used to.     Patient Active Problem List   Diagnosis Date Noted  . Edema 01/26/2021  . Cough 01/26/2021  . Fatigue due to excessive exertion 01/26/2021  . Generalized edema  01/26/2021  . History of dyspnea 01/26/2021  . Recurrent major depressive disorder, in partial remission ([place]) 02/27/2020  . Bilateral myofascial pain 06/01/2019  . Bilateral occipital neuralgia 06/01/2019  . Age-related osteoporosis without current pathological fracture 08/16/2018  . Gait instability 08/16/2018  . Blurred vision, right eye  08/16/2018  . Ocular pain, right eye 08/16/2018  . Dorsalgia 03/31/2016  . H/O total knee replacement 02/14/2016  . Systolic ejection murmur 85/27/7824  . Vitamin D deficiency 11/29/2015  . Allergy to other foods 11/29/2015  . Leg cramps 10/24/2015  . Cramp and spasm 10/24/2015  . Knee pain, bilateral 08/29/2015  . Sleep apnea 05/30/2015  . Restless legs syndrome 05/09/2015  . Allergic rhinitis 03/28/2015  . Anemia, unspecified 03/28/2015  . Anxiety and depression 03/28/2015  . Neck pain 03/28/2015  . Chronic headache 03/28/2015  . DDD (degenerative disc disease), lumbar 03/28/2015  . Gastro-esophageal reflux disease without esophagitis 03/28/2015  . 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 Essential (primary) hypertension 03/28/2015  . Adaptive colitis 03/28/2015  . OP (osteoporosis) 03/28/2015  . Nasal septal perforation 03/28/2015  . Spinal stenosis 03/28/2015  . Gastric ulcer without hemorrhage or perforation 03/28/2015  . Cerebral artery occlusion with cerebral infarction ([place]) 03/28/2015  . Chronic sinusitis 03/28/2015  . Connective tissue and disc stenosis of intervertebral foramina of abdomen and other regions 03/28/2015  . Diverticulosis of large intestine without perforation or abscess without bleeding 03/28/2015  . Osteoarthritis 03/28/2015  . Other specified functional intestinal disorders 03/28/2015  . Hardening of the aorta (main artery of the heart) ([place]) 07/13/2014  . Right leg pain 07/13/2014  . H/O adenomatous polyp of colon 06/27/2005   Past Medical History:  Diagnosis Date  . Allergic rhinitis, cause unspecified   . Allergic rhinitis, cause unspecified   . Atherosclerosis of aorta ([place])   . Back pain    lumbar  . Degeneration of lumbar or lumbosacral intervertebral  disc   . Diverticulitis of colon (without mention of hemorrhage)(562.11)   . Dizziness and giddiness   . Dysthymic disorder   . Headache(784.0)   . Myalgia and  myositis, unspecified   . Osteoporosis, unspecified   . Other and unspecified hyperlipidemia   . Other diseases of nasal cavity and sinuses(478.19)   . Other specified disorders of bladder   . Syncope   . Trigger finger (acquired)   . Unspecified essential hypertension   . Unspecified sleep apnea   . Urinary frequency    Social History   Tobacco Use  . Smoking status: Never Smoker  . Smokeless tobacco: Never Used  Vaping Use  . Vaping Use: Never used  Substance Use Topics  . Alcohol use: No  . Drug use: No   Allergies  Allergen Reactions  . Amoxicillin   . Clindamycin/Lincomycin   . Doxycycline   . Levaquin [Levofloxacin In D5w]   . Mirabegron Nausea And Vomiting  . Nsaids Other (See Comments)    GI upset  . Oxycodone Nausea Only    Other reaction(s): Hallucination  . Phenergan [Promethazine Hcl]   . Singulair [Montelukast]   . Vicodin [Hydrocodone-Acetaminophen]   . Zocor [Simvastatin]   . Zoloft  [Sertraline] Other (See Comments)  . Ampicillin Rash  . Bactrim [Sulfamethoxazole-Trimethoprim] Rash  . Penicillins Rash    Ampicillin, Clindamycin, Doxycycline Levaquin-severe headache, muscle and joint aches, weak  . Tolmetin     Other reaction(s): Other (See Comments) GI upset    Medications: Outpatient Medications Prior to Visit  Medication Sig  . acetaminophen (TYLENOL) 500 MG tablet Take 500 mg by mouth in the morning and at bedtime.   . ALPRAZolam (XANAX) 0.25 MG tablet TAKE ONE TABLET BY MOUTH TWO TIMES A DAY AS NEEDED FOR ANXIETY  . aspirin EC 81 MG tablet Take 81 mg by mouth daily.  Marland Kitchen atorvastatin (LIPITOR) 20 MG tablet Take 1 tablet (20 mg total) by mouth daily.  . Calcium Carbonate-Vitamin D 600-400 MG-UNIT tablet Take 1 tablet by mouth 2 (two) times daily.  . cetirizine (ZYRTEC) 10 MG tablet Take 10 mg daily by mouth.  . Cholecalciferol (VITAMIN D3 PO) Take 1,000 Units by mouth.  . citalopram (CELEXA) 40 MG tablet TAKE 1 TABLET BY MOUTH EVERY DAY  .  cyanocobalamin 1000 MCG tablet Take 1,000 mcg by mouth daily.  . Dentifrices (FLUORIDE TOOTHPASTE DT) Place onto teeth daily.   Marland Kitchen EMGALITY, 300 MG DOSE, 100 MG/ML SOSY Inject into the skin.  . famotidine (PEPCID) 20 MG tablet Take 1 tablet (20 mg total) by mouth daily.  . fluticasone furoate-vilanterol (BREO ELLIPTA) 100-25 MCG/INH AEPB Inhale 1 puff into the lungs daily.  . furosemide (LASIX) 20 MG tablet Take 2 tablets (40 mg total) by mouth daily. May take extra tab if weight up greater 3 pounds  . gabapentin (NEURONTIN) 100 MG capsule Start Gabapentin 200 mg (one tablet) in the morning, 200 mg in the afternoon (one tablet), and 300 mg in the evening.  . Galcanezumab-gnlm (EMGALITY [place]) Inject 300 mg as directed every 30 (thirty) days.   Marland Kitchen lisinopril (ZESTRIL) 2.5 MG tablet TAKE 1 TABLET BY MOUTH DAILY  . MAGNESIUM GLYCINATE PO Take 500 mg by mouth daily.  . melatonin 5 MG TABS Take 5 mg by mouth at bedtime.   . Omega-3 Fatty Acids (FISH OIL) 1200 MG CAPS Take 2 capsules by mouth daily.   Marland Kitchen omeprazole (PRILOSEC) 20 MG capsule TAKE 1 CAPSULE(20 MG) BY MOUTH DAILY  .  phenazopyridine (AZO-TABS) 95 MG tablet Take 1 tablet (95 mg total) by mouth as needed.  . Polyethylene Glycol 3350 (MIRALAX PO) Take by mouth.  . potassium gluconate 595 (99 K) MG TABS tablet Take 1 tablet (595 mg total) by mouth 2 (two) times daily.  . sodium chloride (OCEAN) 0.65 % nasal spray Place 1 spray into the nose as needed.   . traMADol (ULTRAM) 50 MG tablet TAKE 1 TABLET BY MOUTH TWICE DAILY AS NEEDED   No facility-administered medications prior to visit.    Review of Systems  Constitutional: Positive for fatigue.  Respiratory: Negative.   Cardiovascular: Negative.   Neurological: Negative.       Objective    There were no vitals taken for this visit.     Assessment & Plan     1. Edema, unspecified type On fluid pills that help, but need to monitor kidney function as she is taking double at this time.  Advised she can decrease back down and add second in if weight is up more than 2-3 pounds in a day, 5 pounds in a week, or if leg swelling is worse. Will check labs as below and f/u pending results. - Basic Metabolic Panel (BMET)  2. Diarrhea, unspecified type Has intermittent diarrhea. Discussed probiotics and Imodium. Will check labs as below and f/u pending results. - Basic Metabolic Panel (BMET)  3. Hypothyroidism due to acquired atrophy of thyroid H/O this but not on treatment. Having increased fatigue. Will check labs as below and f/u pending results. - TSH   No follow-ups on file.    I discussed the assessment and treatment plan with the patient. The patient was provided an opportunity to ask questions and all were answered. The patient agreed with the plan and demonstrated an understanding of the instructions.   The patient was advised to call back or seek an in-person evaluation if the symptoms worsen or if the condition fails to improve as anticipated.  I provided 25 minutes of non-face-to-face time during this encounter.  Reynolds Bowl, PA-C, have reviewed all documentation for this visit. The documentation on 02/22/21 for the exam, diagnosis, procedures, and orders are all accurate and complete.  [place] [name] [phone] (phone) [phone] (fax)  [place]

## 2021-02-12 DIAGNOSIS — M5416 Radiculopathy, lumbar region: Secondary | ICD-10-CM | POA: Diagnosis not present

## 2021-02-12 DIAGNOSIS — M5136 Other intervertebral disc degeneration, lumbar region: Secondary | ICD-10-CM | POA: Diagnosis not present

## 2021-02-12 DIAGNOSIS — M48062 Spinal stenosis, lumbar region with neurogenic claudication: Secondary | ICD-10-CM | POA: Diagnosis not present

## 2021-02-14 DIAGNOSIS — R609 Edema, unspecified: Secondary | ICD-10-CM | POA: Diagnosis not present

## 2021-02-14 DIAGNOSIS — E034 Atrophy of thyroid (acquired): Secondary | ICD-10-CM | POA: Diagnosis not present

## 2021-02-14 DIAGNOSIS — R197 Diarrhea, unspecified: Secondary | ICD-10-CM | POA: Diagnosis not present

## 2021-02-15 LAB — TSH: TSH: 8.56 u[IU]/mL — ABNORMAL HIGH (ref 0.450–4.500)

## 2021-02-15 LAB — BASIC METABOLIC PANEL
BUN/Creatinine Ratio: 20 (ref 12–28)
BUN: 20 mg/dL (ref 8–27)
CO2: 24 mmol/L (ref 20–29)
Calcium: 9.5 mg/dL (ref 8.7–10.3)
Chloride: 98 mmol/L (ref 96–106)
Creatinine, Ser: 1.02 mg/dL — ABNORMAL HIGH (ref 0.57–1.00)
Glucose: 105 mg/dL — ABNORMAL HIGH (ref 65–99)
Potassium: 4.6 mmol/L (ref 3.5–5.2)
Sodium: 136 mmol/L (ref 134–144)
eGFR: 54 mL/min/{1.73_m2} — ABNORMAL LOW (ref >59)

## 2021-02-19 DIAGNOSIS — M5416 Radiculopathy, lumbar region: Secondary | ICD-10-CM | POA: Diagnosis not present

## 2021-02-19 DIAGNOSIS — M48062 Spinal stenosis, lumbar region with neurogenic claudication: Secondary | ICD-10-CM | POA: Diagnosis not present

## 2021-02-19 DIAGNOSIS — M5136 Other intervertebral disc degeneration, lumbar region: Secondary | ICD-10-CM | POA: Diagnosis not present

## 2021-02-20 ENCOUNTER — Telehealth: Payer: Self-pay

## 2021-02-20 MED ORDER — LEVOTHYROXINE SODIUM 25 MCG PO TABS: 25.0000 ug | ORAL_TABLET | Freq: Every day | ORAL | 1 refills | Status: DC

## 2021-02-20 NOTE — Telephone Encounter (Signed)
Yes. I am so sorry. That was in a result message and I did not look there. I was waiting to make sure they wanted to start. I will send it in

## 2021-02-20 NOTE — Telephone Encounter (Signed)
Copied from [place] [id number]. Topic: General - Other >> Feb 20, 2021 10:32 AM Yvette Rack wrote: Reason for CRM: Pt daughter in law Deneka Greenwalt stated pt was sent an e-mail suggesting a medication however nothing was called in to the pharmacy. [name] requests call back to advise if pt should or should not be starting the medication. Cb# [phone]

## 2021-02-20 NOTE — Telephone Encounter (Signed)
Was the email from [place], because I do not see anything recently. I am not sure what medication she is talking about.

## 2021-02-20 NOTE — Addendum Note (Signed)
Addended by: Mar Daring on: 02/20/2021 05:33 PM   Modules accepted: Orders

## 2021-02-20 NOTE — Telephone Encounter (Signed)
Spoke with daughter-inlaw and she stated that it was the MyChart message you send about starting the Levothyroxine 25 mcg. [name] states medication can be send into pharmacy for the patient.

## 2021-02-22 ENCOUNTER — Encounter: Payer: Self-pay | Admitting: Physician Assistant

## 2021-03-04 ENCOUNTER — Ambulatory Visit: Payer: Medicare Other

## 2021-03-13 DIAGNOSIS — M5481 Occipital neuralgia: Secondary | ICD-10-CM | POA: Diagnosis not present

## 2021-03-13 DIAGNOSIS — M542 Cervicalgia: Secondary | ICD-10-CM | POA: Diagnosis not present

## 2021-03-18 ENCOUNTER — Ambulatory Visit
Admission: RE | Admit: 2021-03-18 | Discharge: 2021-03-18 | Disposition: A | Discharge status: Home / Self Care | Payer: Medicare Other | Source: Ambulatory Visit | Attending: Adult Health | Admitting: Adult Health

## 2021-03-18 ENCOUNTER — Other Ambulatory Visit: Payer: Self-pay

## 2021-03-18 DIAGNOSIS — I700 Atherosclerosis of aorta: Secondary | ICD-10-CM | POA: Diagnosis not present

## 2021-03-18 DIAGNOSIS — Z8709 Personal history of other diseases of the respiratory system: Secondary | ICD-10-CM | POA: Diagnosis not present

## 2021-03-18 DIAGNOSIS — I10 Essential (primary) hypertension: Secondary | ICD-10-CM

## 2021-03-18 DIAGNOSIS — R601 Generalized edema: Secondary | ICD-10-CM | POA: Insufficient documentation

## 2021-03-18 DIAGNOSIS — R059 Cough, unspecified: Secondary | ICD-10-CM

## 2021-03-18 DIAGNOSIS — R609 Edema, unspecified: Secondary | ICD-10-CM

## 2021-03-18 DIAGNOSIS — Z87898 Personal history of other specified conditions: Secondary | ICD-10-CM

## 2021-03-18 DIAGNOSIS — I351 Nonrheumatic aortic (valve) insufficiency: Secondary | ICD-10-CM | POA: Diagnosis not present

## 2021-03-18 DIAGNOSIS — E785 Hyperlipidemia, unspecified: Secondary | ICD-10-CM | POA: Insufficient documentation

## 2021-03-18 LAB — ECHOCARDIOGRAM COMPLETE
AR max vel: 2.13 cm2
AV Area VTI: 1.94 cm2
AV Area mean vel: 1.85 cm2
AV Mean grad: 4 mmHg
AV Peak grad: 7.2 mmHg
Ao pk vel: 1.34 m/s
Area-P 1/2: 2.57 cm2
MV VTI: 1.31 cm2
S' Lateral: 2.4 cm

## 2021-03-18 NOTE — Progress Notes (Signed)
*  PRELIMINARY RESULTS* Echocardiogram 2D Echocardiogram has been performed.  Wallie Char Wisdom Rickey 03/18/2021, 10:31 AM

## 2021-03-18 NOTE — Progress Notes (Signed)
Previous Mar Daring, PA-C patient scheduled to see you Dr. Rosanna Randy in near future- ECHO for review.

## 2021-03-19 ENCOUNTER — Encounter: Payer: Self-pay | Admitting: Family Medicine

## 2021-03-19 ENCOUNTER — Ambulatory Visit (INDEPENDENT_AMBULATORY_CARE_PROVIDER_SITE_OTHER): Payer: Medicare Other | Admitting: Family Medicine

## 2021-03-19 VITALS — BP 114/69 | HR 66 | Temp 97.8°F | Resp 18 | Ht 59.0 in | Wt 177.0 lb

## 2021-03-19 DIAGNOSIS — R2681 Unsteadiness on feet: Secondary | ICD-10-CM

## 2021-03-19 DIAGNOSIS — R252 Cramp and spasm: Secondary | ICD-10-CM | POA: Diagnosis not present

## 2021-03-19 DIAGNOSIS — M810 Age-related osteoporosis without current pathological fracture: Secondary | ICD-10-CM | POA: Diagnosis not present

## 2021-03-19 DIAGNOSIS — G2581 Restless legs syndrome: Secondary | ICD-10-CM | POA: Diagnosis not present

## 2021-03-19 DIAGNOSIS — I10 Essential (primary) hypertension: Secondary | ICD-10-CM | POA: Diagnosis not present

## 2021-03-19 NOTE — Progress Notes (Signed)
I,April Miller,acting as a scribe for Megan Mans, MD.,have documented all relevant documentation on the behalf of Megan Mans, MD,as directed by  Megan Mans, MD while in the presence of Megan Mans, MD.   Established patient visit   Patient: Helen Ramsey   DOB: 09/30/1933   87 y.o. Female  MRN: [id number] Visit Date: 03/19/2021  Today's healthcare provider: Megan Mans, MD   Chief Complaint  Patient presents with  . Follow-up   Subjective    HPI  Patient comes in today for follow-up of muscle cramps in her feet and legs and hands. He is taking her medications as prescribed.  Evidently her ideal dry weight is 176 pounds. Muscle cramp From 12/20/2020-seen by Joycelyn Man. Labs checked showing-good.  Hypothyroid, follow-up  Lab Results  Component Value Date   TSH 8.560 (H) 02/14/2021   TSH 6.870 (H) 01/22/2021   TSH 3.750 04/07/2016   Wt Readings from Last 3 Encounters:  03/19/21 177 lb (80.3 kg)  01/22/21 178 lb 12.8 oz (81.1 kg)  01/09/21 176 lb (79.8 kg)    She was last seen for hypothyroid 1 months ago.  Management since that visit includes; started levothyroxine 25 mcg. She reports good compliance with treatment. She is not having side effects. none  --------------------------------------------------------------------      Medications: Outpatient Medications Prior to Visit  Medication Sig  . acetaminophen (TYLENOL) 500 MG tablet Take 500 mg by mouth in the morning and at bedtime.   . ALPRAZolam (XANAX) 0.25 MG tablet TAKE ONE TABLET BY MOUTH TWO TIMES A DAY AS NEEDED FOR ANXIETY  . aspirin EC 81 MG tablet Take 81 mg by mouth daily.  Marland Kitchen atorvastatin (LIPITOR) 20 MG tablet Take 1 tablet (20 mg total) by mouth daily.  . cetirizine (ZYRTEC) 10 MG tablet Take 10 mg daily by mouth.  . Cholecalciferol (VITAMIN D3 PO) Take 1,000 Units by mouth.  . citalopram (CELEXA) 40 MG tablet TAKE 1 TABLET BY MOUTH EVERY DAY  .  cyanocobalamin 1000 MCG tablet Take 1,000 mcg by mouth daily.  . Dentifrices (FLUORIDE TOOTHPASTE DT) Place onto teeth daily.   Marland Kitchen EMGALITY, 300 MG DOSE, 100 MG/ML SOSY Inject into the skin.  . famotidine (PEPCID) 20 MG tablet Take 1 tablet (20 mg total) by mouth daily.  . fluticasone furoate-vilanterol (BREO ELLIPTA) 100-25 MCG/INH AEPB Inhale 1 puff into the lungs daily.  . furosemide (LASIX) 20 MG tablet Take 2 tablets (40 mg total) by mouth daily. May take extra tab if weight up greater 3 pounds  . gabapentin (NEURONTIN) 100 MG capsule Start Gabapentin 200 mg (one tablet) in the morning, 200 mg in the afternoon (one tablet), and 300 mg in the evening.  . Galcanezumab-gnlm (EMGALITY [place]) Inject 300 mg as directed every 30 (thirty) days.   Marland Kitchen levothyroxine (SYNTHROID) 25 MCG tablet Take 1 tablet (25 mcg total) by mouth daily before breakfast.  . lisinopril (ZESTRIL) 2.5 MG tablet TAKE 1 TABLET BY MOUTH DAILY  . MAGNESIUM GLYCINATE PO Take 500 mg by mouth daily.  . melatonin 5 MG TABS Take 5 mg by mouth at bedtime.   . Omega-3 Fatty Acids (FISH OIL) 1200 MG CAPS Take 2 capsules by mouth daily.   Marland Kitchen omeprazole (PRILOSEC) 20 MG capsule TAKE 1 CAPSULE(20 MG) BY MOUTH DAILY  . phenazopyridine (AZO-TABS) 95 MG tablet Take 1 tablet (95 mg total) by mouth as needed.  . Polyethylene Glycol 3350 (MIRALAX PO) Take by  mouth.  . potassium gluconate 595 (99 K) MG TABS tablet Take 1 tablet (595 mg total) by mouth 2 (two) times daily.  . sodium chloride (OCEAN) 0.65 % nasal spray Place 1 spray into the nose as needed.   . traMADol (ULTRAM) 50 MG tablet TAKE 1 TABLET BY MOUTH TWICE DAILY AS NEEDED  . Calcium Carbonate-Vitamin D 600-400 MG-UNIT tablet Take 1 tablet by mouth 2 (two) times daily. (Patient not taking: Reported on 03/19/2021)   No facility-administered medications prior to visit.    Review of Systems  Constitutional: Negative for appetite change, chills, fatigue and fever.  Respiratory: Negative for  chest tightness and shortness of breath.   Cardiovascular: Negative for chest pain and palpitations.  Gastrointestinal: Negative for abdominal pain, nausea and vomiting.  Neurological: Negative for dizziness and weakness.        Objective    BP 114/69 (BP Location: Left Arm, Patient Position: Sitting, Cuff Size: Large)   Pulse 66   Temp 97.8 F (36.6 C) (Oral)   Resp 18   Ht 4\' 11"  (1.499 m)   Wt 177 lb (80.3 kg)   SpO2 93%   BMI 35.75 kg/m  BP Readings from Last 3 Encounters:  03/19/21 114/69  01/22/21 (!) 126/49  01/09/21 (!) 123/51   Wt Readings from Last 3 Encounters:  03/19/21 177 lb (80.3 kg)  01/22/21 178 lb 12.8 oz (81.1 kg)  01/09/21 176 lb (79.8 kg)       Physical Exam Vitals reviewed.  Constitutional:      General: She is not in acute distress.    Appearance: She is well-developed. She is obese. She is not ill-appearing or diaphoretic.  Neck:     Thyroid: No thyromegaly.     Vascular: No JVD.     Trachea: No tracheal deviation.  Cardiovascular:     Rate and Rhythm: Normal rate and regular rhythm.     Heart sounds: Normal heart sounds. No murmur heard. No friction rub. No gallop.   Pulmonary:     Effort: Pulmonary effort is normal. No respiratory distress.     Breath sounds: Normal breath sounds. No wheezing or rales.  Musculoskeletal:     Cervical back: Normal range of motion and neck supple.     Right lower leg: Edema (trace) present.     Left lower leg: Edema (trace) present.  Lymphadenopathy:     Cervical: No cervical adenopathy.  Neurological:     General: No focal deficit present.     Mental Status: She is alert and oriented to person, place, and time.  Psychiatric:        Mood and Affect: Mood normal.        Behavior: Behavior normal.        Thought Content: Thought content normal.       MMSE - Mini Mental State Exam 03/19/2021  Orientation to time 5  Orientation to Place 5  Registration 3  Attention/ Calculation 5  Recall 3   Language- name 2 objects 2  Language- repeat 1  Language- follow 3 step command 3  Language- read & follow direction 1  Write a sentence 1  Copy design 1  Total score 30   PHQ9 SCORE ONLY 03/19/2021 01/09/2021 12/20/2020  PHQ-9 Total Score 10 5 13     No results found for any visits on 03/19/21.  Assessment & Plan     1. Leg cramps Try mustard and magnesium daily.  2. Restless legs syndrome Sitter checking iron  levels.  3. Gait instability   4. Essential (primary) hypertension On low-dose lisinopril  5. Age-related osteoporosis without current pathological fracture    No follow-ups on file.      I, Megan Mans, MD, have reviewed all documentation for this visit. The documentation on 03/23/21 for the exam, diagnosis, procedures, and orders are all accurate and complete.    Analeise Mccleery Wendelyn Breslow, MD  [facility] [phone] (phone) [phone] (fax)  [facility]

## 2021-03-19 NOTE — Patient Instructions (Signed)
Try mustard daily to help with muscle cramps.

## 2021-03-26 ENCOUNTER — Other Ambulatory Visit: Payer: Self-pay

## 2021-03-26 ENCOUNTER — Emergency Department
Admission: EM | Admit: 2021-03-26 | Discharge: 2021-03-26 | Disposition: A | Discharge status: Home / Self Care | Payer: Medicare Other | Attending: Emergency Medicine | Admitting: Emergency Medicine

## 2021-03-26 DIAGNOSIS — Z96651 Presence of right artificial knee joint: Secondary | ICD-10-CM | POA: Insufficient documentation

## 2021-03-26 DIAGNOSIS — I10 Essential (primary) hypertension: Secondary | ICD-10-CM | POA: Insufficient documentation

## 2021-03-26 DIAGNOSIS — Z79899 Other long term (current) drug therapy: Secondary | ICD-10-CM | POA: Diagnosis not present

## 2021-03-26 DIAGNOSIS — Z7982 Long term (current) use of aspirin: Secondary | ICD-10-CM | POA: Diagnosis not present

## 2021-03-26 DIAGNOSIS — R252 Cramp and spasm: Secondary | ICD-10-CM | POA: Diagnosis not present

## 2021-03-26 DIAGNOSIS — M62831 Muscle spasm of calf: Secondary | ICD-10-CM | POA: Diagnosis not present

## 2021-03-26 DIAGNOSIS — M62838 Other muscle spasm: Secondary | ICD-10-CM

## 2021-03-26 LAB — CBC
HCT: 33.7 % — ABNORMAL LOW (ref 36.0–46.0)
Hemoglobin: 11.3 g/dL — ABNORMAL LOW (ref 12.0–15.0)
MCH: 31.8 pg (ref 26.0–34.0)
MCHC: 33.5 g/dL (ref 30.0–36.0)
MCV: 94.9 fL (ref 80.0–100.0)
Platelets: 233 10*3/uL (ref 150–400)
RBC: 3.55 MIL/uL — ABNORMAL LOW (ref 3.87–5.11)
RDW: 13.9 % (ref 11.5–15.5)
WBC: 9.3 10*3/uL (ref 4.0–10.5)
nRBC: 0 % (ref 0.0–0.2)

## 2021-03-26 LAB — COMPREHENSIVE METABOLIC PANEL
ALT: 22 U/L (ref 0–44)
AST: 22 U/L (ref 15–41)
Albumin: 3.7 g/dL (ref 3.5–5.0)
Alkaline Phosphatase: 66 U/L (ref 38–126)
Anion gap: 7 (ref 5–15)
BUN: 29 mg/dL — ABNORMAL HIGH (ref 8–23)
CO2: 25 mmol/L (ref 22–32)
Calcium: 8.8 mg/dL — ABNORMAL LOW (ref 8.9–10.3)
Chloride: 102 mmol/L (ref 98–111)
Creatinine, Ser: 1.04 mg/dL — ABNORMAL HIGH (ref 0.44–1.00)
GFR, Estimated: 53 mL/min — ABNORMAL LOW (ref >60)
Glucose, Bld: 105 mg/dL — ABNORMAL HIGH (ref 70–99)
Potassium: 4.6 mmol/L (ref 3.5–5.1)
Sodium: 134 mmol/L — ABNORMAL LOW (ref 135–145)
Total Bilirubin: 0.8 mg/dL (ref 0.3–1.2)
Total Protein: 6.5 g/dL (ref 6.5–8.1)

## 2021-03-26 LAB — MAGNESIUM: Magnesium: 2.4 mg/dL (ref 1.7–2.4)

## 2021-03-26 LAB — CK: Total CK: 70 U/L (ref 38–234)

## 2021-03-26 MED ORDER — SODIUM CHLORIDE 0.9 % IV BOLUS
1000.0000 mL | Freq: Once | INTRAVENOUS | Status: AC
  Administered 2021-03-26: 1000 mL via INTRAVENOUS

## 2021-03-26 NOTE — ED Notes (Signed)
Unable to get e-signature for discharge due to patient in hallway with no computer. Pt verbalized understanding of discharge instructions.

## 2021-03-26 NOTE — ED Triage Notes (Signed)
Pt arrives via pov from home with son. Pt c/o muscle spasms that has been occurring over last couple months in legs and hands. NAD noted at this time.

## 2021-03-26 NOTE — ED Provider Notes (Signed)
[facility] Emergency Department Provider Note  Time seen: 4:30 PM  I have reviewed the triage vital signs and the nursing notes.   HISTORY  Chief Complaint Spasms   HPI Helen Ramsey is a 87 y.o. female   with a past medical history of allergies, headaches, anxiety, presents to the emergency department for leg spasms.  According to the patient for a long time now she has been experiencing leg spasms which she describes as spasms/cramps.  States they have been getting worse recently.  Saw her doctor for the same we tried putting her on a new medication and increasing her potassium but states this is not helping.  Patient states she lives alone and gets "worried" at home.  Daughter is here with the patient.  Patient states her hands will cramp up and she cannot relax them at times.  Here the patient appears quite anxious, she is hyperventilating while talking.  Patient states she has tried taking Xanax for this but it has not helped at home.  Past Medical History:  Diagnosis Date  . Allergic rhinitis, cause unspecified   . Allergic rhinitis, cause unspecified   . Atherosclerosis of aorta ([place])   . Back pain    lumbar  . Degeneration of lumbar or lumbosacral intervertebral disc   . Diverticulitis of colon (without mention of hemorrhage)(562.11)   . Dizziness and giddiness   . Dysthymic disorder   . Headache(784.0)   . Myalgia and myositis, unspecified   . Osteoporosis, unspecified   . Other and unspecified hyperlipidemia   . Other diseases of nasal cavity and sinuses(478.19)   . Other specified disorders of bladder   . Syncope   . Trigger finger (acquired)   . Unspecified essential hypertension   . Unspecified sleep apnea   . Urinary frequency     Patient Active Problem List   Diagnosis Date Noted  . Edema 01/26/2021  . Cough 01/26/2021  . Fatigue due to excessive exertion 01/26/2021  . Generalized edema  01/26/2021  . History of dyspnea 01/26/2021   . Recurrent major depressive disorder, in partial remission ([place]) 02/27/2020  . Bilateral myofascial pain 06/01/2019  . Bilateral occipital neuralgia 06/01/2019  . Age-related osteoporosis without current pathological fracture 08/16/2018  . Gait instability 08/16/2018  . Blurred vision, right eye 08/16/2018  . Ocular pain, right eye 08/16/2018  . Dorsalgia 03/31/2016  . H/O total knee replacement 02/14/2016  . Systolic ejection murmur 78/29/5621  . Vitamin D deficiency 11/29/2015  . Allergy to other foods 11/29/2015  . Leg cramps 10/24/2015  . Cramp and spasm 10/24/2015  . Knee pain, bilateral 08/29/2015  . Sleep apnea 05/30/2015  . Restless legs syndrome 05/09/2015  . Allergic rhinitis 03/28/2015  . Anemia, unspecified 03/28/2015  . Anxiety and depression 03/28/2015  . Neck pain 03/28/2015  . Chronic headache 03/28/2015  . DDD (degenerative disc disease), lumbar 03/28/2015  . Gastro-esophageal reflux disease without esophagitis 03/28/2015  . 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 Essential (primary) hypertension 03/28/2015  . Adaptive colitis 03/28/2015  . OP (osteoporosis) 03/28/2015  . Nasal septal perforation 03/28/2015  . Spinal stenosis 03/28/2015  . Gastric ulcer without hemorrhage or perforation 03/28/2015  . Cerebral artery occlusion with cerebral infarction ([place]) 03/28/2015  . Chronic sinusitis 03/28/2015  . Connective tissue and disc stenosis of intervertebral foramina of abdomen and other regions 03/28/2015  . Diverticulosis of large intestine without perforation or abscess without bleeding 03/28/2015  . Osteoarthritis 03/28/2015  . Other specified  functional intestinal disorders 03/28/2015  . Hardening of the aorta (main artery of the heart) ([place]) 07/13/2014  . Right leg pain 07/13/2014  . H/O adenomatous polyp of colon 06/27/2005    Past Surgical History:  Procedure Laterality Date  . ABDOMINAL HYSTERECTOMY    . ADENOIDECTOMY    .  APPENDECTOMY    . ARTHROSCOPIC KNEE SURGERY    . BREAST BIOPSY Right    lymph node surgery, benign   . CARDIAC CATHETERIZATION     [facility]  . CARPAL TUNNEL RELEASE    . CATARACT EXTRACTION W/ INTRAOCULAR LENS IMPLANT & ANTERIOR VITRECTOMY, BILATERAL    . CHOLECYSTECTOMY    . KNEE ARTHROPLASTY Right 01/30/2016   Procedure: COMPUTER ASSISTED TOTAL KNEE ARTHROPLASTY;  Surgeon: Dereck Leep, MD;  Location: [facility] ORS;  Service: Orthopedics;  Laterality: Right;  . R/O Lymph Nodes; Right Axilla    . ROTATOR CUFF REPAIR Right   . SPG block  03/20/2016   sphenopalatine ganglion and maxillary division of trigeminal nerve block block Dr. Manuella Ghazi  . TEMPORAL ARTERY BIOPSY / LIGATION    . TONSILLECTOMY    . TUBAL LIGATION      Prior to Admission medications   Medication Sig Start Date End Date Taking? Authorizing Provider  acetaminophen (TYLENOL) 500 MG tablet Take 500 mg by mouth in the morning and at bedtime.     [provider]  ALPRAZolam Duanne Moron) 0.25 MG tablet TAKE ONE TABLET BY MOUTH TWO TIMES A DAY AS NEEDED FOR ANXIETY 08/04/20   Mar Daring, PA-C  aspirin EC 81 MG tablet Take 81 mg by mouth daily.    [provider]  atorvastatin (LIPITOR) 20 MG tablet Take 1 tablet (20 mg total) by mouth daily. 01/11/21   Mar Daring, PA-C  Calcium Carbonate-Vitamin D 600-400 MG-UNIT tablet Take 1 tablet by mouth 2 (two) times daily. Patient not taking: Reported on 03/19/2021 12/19/15   Margarita Rana, MD  cetirizine (ZYRTEC) 10 MG tablet Take 10 mg daily by mouth.    [provider]  Cholecalciferol (VITAMIN D3 PO) Take 1,000 Units by mouth.    [provider]  citalopram (CELEXA) 40 MG tablet TAKE 1 TABLET BY MOUTH EVERY DAY 01/11/21   Mar Daring, PA-C  cyanocobalamin 1000 MCG tablet Take 1,000 mcg by mouth daily.    [provider]  Dentifrices (FLUORIDE TOOTHPASTE DT) Place onto teeth daily.     [provider]  EMGALITY, 300 MG  DOSE, 100 MG/ML SOSY Inject into the skin. 01/22/21   [provider]  famotidine (PEPCID) 20 MG tablet Take 1 tablet (20 mg total) by mouth daily. 01/11/21   Mar Daring, PA-C  fluticasone furoate-vilanterol (BREO ELLIPTA) 100-25 MCG/INH AEPB Inhale 1 puff into the lungs daily. 01/09/21   Mar Daring, PA-C  furosemide (LASIX) 20 MG tablet Take 2 tablets (40 mg total) by mouth daily. May take extra tab if weight up greater 3 pounds 01/09/21   Fenton Malling M, PA-C  gabapentin (NEURONTIN) 100 MG capsule Start Gabapentin 200 mg (one tablet) in the morning, 200 mg in the afternoon (one tablet), and 300 mg in the evening. 01/09/21   Burnette, Clearnce Sorrel, PA-C  Galcanezumab-gnlm (EMGALITY [place]) Inject 300 mg as directed every 30 (thirty) days.  09/18/18   [provider]  levothyroxine (SYNTHROID) 25 MCG tablet Take 1 tablet (25 mcg total) by mouth daily before breakfast. 02/20/21   Mar Daring, PA-C  lisinopril (ZESTRIL) 2.5  MG tablet TAKE 1 TABLET BY MOUTH DAILY 01/22/21   Flinchum, Kelby Aline, FNP  MAGNESIUM GLYCINATE PO Take 500 mg by mouth daily.    [provider]  melatonin 5 MG TABS Take 5 mg by mouth at bedtime.     [provider]  Omega-3 Fatty Acids (FISH OIL) 1200 MG CAPS Take 2 capsules by mouth daily.     [provider]  omeprazole (PRILOSEC) 20 MG capsule TAKE 1 CAPSULE(20 MG) BY MOUTH DAILY 10/15/20   Mar Daring, PA-C  phenazopyridine (AZO-TABS) 95 MG tablet Take 1 tablet (95 mg total) by mouth as needed. 05/29/16   Mar Daring, PA-C  Polyethylene Glycol 3350 (MIRALAX PO) Take by mouth.    [provider]  potassium gluconate 595 (99 K) MG TABS tablet Take 1 tablet (595 mg total) by mouth 2 (two) times daily. 12/27/18   Mar Daring, PA-C  sodium chloride (OCEAN) 0.65 % nasal spray Place 1 spray into the nose as needed.     [provider]  traMADol (ULTRAM) 50 MG tablet TAKE 1  TABLET BY MOUTH TWICE DAILY AS NEEDED 10/23/20   Mar Daring, PA-C    Allergies  Allergen Reactions  . Amoxicillin   . Clindamycin/Lincomycin   . Doxycycline   . Levaquin [Levofloxacin In D5w]   . Mirabegron Nausea And Vomiting  . Nsaids Other (See Comments)    GI upset  . Oxycodone Nausea Only    Other reaction(s): Hallucination  . Phenergan [Promethazine Hcl]   . Singulair [Montelukast]   . Vicodin [Hydrocodone-Acetaminophen]   . Zocor [Simvastatin]   . Zoloft  [Sertraline] Other (See Comments)  . Ampicillin Rash  . Bactrim [Sulfamethoxazole-Trimethoprim] Rash  . Penicillins Rash    Ampicillin, Clindamycin, Doxycycline Levaquin-severe headache, muscle and joint aches, weak  . Tolmetin     Other reaction(s): Other (See Comments) GI upset    Family History  Problem Relation Age of Onset  . Heart disease Mother   . Depression Mother   . Colon polyps Mother   . Dementia Mother   . Heart disease Father   . Dementia Father   . Heart disease Brother   . Hypertension Brother   . Heart disease Brother   . Hypertension Brother   . Kidney cancer Neg Hx   . Bladder Cancer Neg Hx     Social History Social History   Tobacco Use  . Smoking status: Never Smoker  . Smokeless tobacco: Never Used  Vaping Use  . Vaping Use: Never used  Substance Use Topics  . Alcohol use: No  . Drug use: No    Review of Systems Constitutional: Negative for fever. Cardiovascular: Negative for chest pain. Respiratory: Negative for shortness of breath. Gastrointestinal: Negative for abdominal pain, vomiting and diarrhea. Musculoskeletal: Patient states cramping/spasm discomfort in her arms and legs and hands. Neurological: Negative for headache All other ROS negative  ____________________________________________   PHYSICAL EXAM:  VITAL SIGNS: ED Triage Vitals  Enc Vitals Group     BP 03/26/21 1519 (!) 151/64     Pulse Rate 03/26/21 1519 72     Resp 03/26/21 1519 18      Temp 03/26/21 1519 99 F (37.2 C)     Temp Source 03/26/21 1519 Oral     SpO2 03/26/21 1519 93 %     Weight 03/26/21 1519 176 lb (79.8 kg)     Height 03/26/21 1519 5' (1.524 m)     Head  Circumference --      Peak Flow --      Pain Score 03/26/21 1522 0     Pain Loc --      Pain Edu? --      Excl. in [place]? --    Constitutional: Patient is awake alert and oriented.  Patient is quite anxious in appearance, speaking rapidly at times. Eyes: Normal exam ENT      Head: Normocephalic and atraumatic.      Mouth/Throat: Mucous membranes are moist. Cardiovascular: Normal rate, regular rhythm.  Respiratory: Mild tachypnea.  Clear lung sounds on examination. Gastrointestinal: Soft and nontender. No distention.   Musculoskeletal: Nontender with normal range of motion in all extremities. Neurologic:  Normal speech and language. No gross focal neurologic deficits  Skin:  Skin is warm, dry and intact.  Psychiatric: Anxious appearing.   ____________________________________________   INITIAL IMPRESSION / ASSESSMENT AND PLAN / ED COURSE  Pertinent labs & imaging results that were available during my care of the patient were reviewed by me and considered in my medical decision making (see chart for details).   Patient presents to the emergency department for spasms/cramps in her arms and legs.  States at times her hands will cramp and she cannot relax them.  Currently she is rubbing her legs.  Patient is anxious in appearance.  Differential would include electrolyte or metabolic abnormality, dehydration, hypocarbia from anxiety/hyperventilation.  We will check labs including calcium, magnesium.  We will IV hydrate while awaiting results.  Overall patient appears well, no distress.  Slightly low calcium.  Discussed with the patient increase her calcium supplement for the next 7 days.  Patient is now much more calm does not appear anxious and states all of the cramping and spasm has resolved.  I highly  suspect patient spasm and cramping could be due to hyperventilation.  Discussed with the patient relaxation techniques and breathing control when this occurs.  Patient will follow up with her doctor.  Patient finishing her IV fluids.  Helen Ramsey was evaluated in Emergency Department on 03/26/2021 for the symptoms described in the history of present illness. She was evaluated in the context of the global COVID-19 pandemic, which necessitated consideration that the patient might be at risk for infection with the SARS-CoV-2 virus that causes COVID-19. Institutional protocols and algorithms that pertain to the evaluation of patients at risk for COVID-19 are in a state of rapid change based on information released by regulatory bodies including the CDC and federal and state organizations. These policies and algorithms were followed during the patient's care in the ED.  ____________________________________________   FINAL CLINICAL IMPRESSION(S) / ED DIAGNOSES  Muscle spasms Cramping   Harvest Dark, MD 03/26/21 1756

## 2021-03-27 ENCOUNTER — Telehealth: Payer: Self-pay

## 2021-03-27 NOTE — Telephone Encounter (Signed)
Patients daughter contacted our office to schedule hospital follow up appointment. According to after visit summary patient was diagnosed with muscle spasm of there lower extremities, patients daughter states that patient was seen by Dr. Rosanna Randy for this problem then taken to hospital following day. Daughter states that symptoms are related to anxiety and on AVS it says for patient to follow back up in office as soon as possible, I advised daughter that no provider has opening this week and that I would contact [facility] to see if a provider can see her for hospital follow up, daughter states that patient will not see anyone else but Patrici Ranks or Dr. Rosanna Randy. She would like a call back from our staff to schedule hospital follow up. KW

## 2021-03-27 NOTE — Telephone Encounter (Signed)
Please advise 

## 2021-03-27 NOTE — Telephone Encounter (Signed)
Sorry.  I have no openings.

## 2021-03-27 NOTE — Telephone Encounter (Signed)
Appt scheduled for next week. Patient's daughter advised.

## 2021-03-28 DIAGNOSIS — M5416 Radiculopathy, lumbar region: Secondary | ICD-10-CM | POA: Diagnosis not present

## 2021-03-28 DIAGNOSIS — M48062 Spinal stenosis, lumbar region with neurogenic claudication: Secondary | ICD-10-CM | POA: Diagnosis not present

## 2021-04-02 ENCOUNTER — Ambulatory Visit (INDEPENDENT_AMBULATORY_CARE_PROVIDER_SITE_OTHER): Payer: Medicare Other | Admitting: Family Medicine

## 2021-04-02 ENCOUNTER — Encounter: Payer: Self-pay | Admitting: Family Medicine

## 2021-04-02 ENCOUNTER — Other Ambulatory Visit: Payer: Self-pay

## 2021-04-02 VITALS — BP 115/58 | HR 69 | Temp 98.5°F | Resp 16 | Wt 181.0 lb

## 2021-04-02 DIAGNOSIS — R252 Cramp and spasm: Secondary | ICD-10-CM | POA: Diagnosis not present

## 2021-04-02 DIAGNOSIS — I700 Atherosclerosis of aorta: Secondary | ICD-10-CM | POA: Diagnosis not present

## 2021-04-02 DIAGNOSIS — I6350 Cerebral infarction due to unspecified occlusion or stenosis of unspecified cerebral artery: Secondary | ICD-10-CM | POA: Diagnosis not present

## 2021-04-02 DIAGNOSIS — R2681 Unsteadiness on feet: Secondary | ICD-10-CM | POA: Diagnosis not present

## 2021-04-02 DIAGNOSIS — F3341 Major depressive disorder, recurrent, in partial remission: Secondary | ICD-10-CM | POA: Diagnosis not present

## 2021-04-02 DIAGNOSIS — I10 Essential (primary) hypertension: Secondary | ICD-10-CM | POA: Diagnosis not present

## 2021-04-02 NOTE — Patient Instructions (Signed)
Try Norton Blizzard or Omiron blood pressure cuff to monitor BP at home .

## 2021-04-02 NOTE — Progress Notes (Signed)
Established patient visit   Patient: Helen Ramsey   DOB: 01-29-1933   87 y.o. Female  MRN: [id number] Visit Date: 04/02/2021  Today's healthcare provider: Megan Mans, MD   Chief Complaint  Patient presents with  . ER Follow up    Subjective    HPI  Patient has chronic lumbar radiculopathy and chronic muscle pain.  She is becoming less mobile and more frail and is anxious about remaining at home.  She wants to maintain her independence.  She is starting to lose same Follow up ER visit  Patient was seen in ER for Muscle Spasms on 03/26/2021. She was treated for; Spasms. Treatment for this included; see notes in chart. Patient reports that she has doubled her calcium intake. She was advised to do this for a week.  She reports good compliance with treatment. She reports this condition is Improved.       Medications: Outpatient Medications Prior to Visit  Medication Sig  . acetaminophen (TYLENOL) 500 MG tablet Take 500 mg by mouth in the morning and at bedtime.   . ALPRAZolam (XANAX) 0.25 MG tablet TAKE ONE TABLET BY MOUTH TWO TIMES A DAY AS NEEDED FOR ANXIETY  . aspirin EC 81 MG tablet Take 81 mg by mouth daily.  Marland Kitchen atorvastatin (LIPITOR) 20 MG tablet Take 1 tablet (20 mg total) by mouth daily.  . Calcium Carbonate-Vitamin D 600-400 MG-UNIT tablet Take 1 tablet by mouth 2 (two) times daily. (Patient not taking: Reported on 03/19/2021)  . cetirizine (ZYRTEC) 10 MG tablet Take 10 mg daily by mouth.  . Cholecalciferol (VITAMIN D3 PO) Take 1,000 Units by mouth.  . citalopram (CELEXA) 40 MG tablet TAKE 1 TABLET BY MOUTH EVERY DAY  . cyanocobalamin 1000 MCG tablet Take 1,000 mcg by mouth daily.  . Dentifrices (FLUORIDE TOOTHPASTE DT) Place onto teeth daily.   Marland Kitchen EMGALITY, 300 MG DOSE, 100 MG/ML SOSY Inject into the skin.  . famotidine (PEPCID) 20 MG tablet Take 1 tablet (20 mg total) by mouth daily.  . fluticasone furoate-vilanterol (BREO ELLIPTA) 100-25 MCG/INH AEPB  Inhale 1 puff into the lungs daily.  . furosemide (LASIX) 20 MG tablet Take 2 tablets (40 mg total) by mouth daily. May take extra tab if weight up greater 3 pounds  . gabapentin (NEURONTIN) 100 MG capsule Start Gabapentin 200 mg (one tablet) in the morning, 200 mg in the afternoon (one tablet), and 300 mg in the evening.  . Galcanezumab-gnlm (EMGALITY [place]) Inject 300 mg as directed every 30 (thirty) days.   Marland Kitchen levothyroxine (SYNTHROID) 25 MCG tablet Take 1 tablet (25 mcg total) by mouth daily before breakfast.  . lisinopril (ZESTRIL) 2.5 MG tablet TAKE 1 TABLET BY MOUTH DAILY  . MAGNESIUM GLYCINATE PO Take 500 mg by mouth daily.  . melatonin 5 MG TABS Take 5 mg by mouth at bedtime.   . Omega-3 Fatty Acids (FISH OIL) 1200 MG CAPS Take 2 capsules by mouth daily.   Marland Kitchen omeprazole (PRILOSEC) 20 MG capsule TAKE 1 CAPSULE(20 MG) BY MOUTH DAILY  . phenazopyridine (AZO-TABS) 95 MG tablet Take 1 tablet (95 mg total) by mouth as needed.  . Polyethylene Glycol 3350 (MIRALAX PO) Take by mouth.  . potassium gluconate 595 (99 K) MG TABS tablet Take 1 tablet (595 mg total) by mouth 2 (two) times daily.  . sodium chloride (OCEAN) 0.65 % nasal spray Place 1 spray into the nose as needed.   . traMADol (ULTRAM) 50 MG tablet  TAKE 1 TABLET BY MOUTH TWICE DAILY AS NEEDED   No facility-administered medications prior to visit.    Review of Systems  Constitutional: Negative for appetite change, chills, fatigue and fever.  Respiratory: Negative for chest tightness and shortness of breath.   Cardiovascular: Negative for chest pain and palpitations.  Gastrointestinal: Negative for abdominal pain, nausea and vomiting.  Neurological: Negative for dizziness and weakness.        Objective    BP (!) 115/58   Pulse 69   Temp 98.5 F (36.9 C)   Resp 16   Wt 181 lb (82.1 kg)   BMI 35.35 kg/m  Wt Readings from Last 3 Encounters:  04/02/21 181 lb (82.1 kg)  03/26/21 176 lb (79.8 kg)  03/19/21 177 lb (80.3 kg)        Physical Exam Vitals reviewed.  Constitutional:      General: She is not in acute distress.    Appearance: She is well-developed. She is obese. She is not ill-appearing or diaphoretic.  Eyes:     General: No scleral icterus.    Conjunctiva/sclera: Conjunctivae normal.  Neck:     Thyroid: No thyromegaly.     Vascular: No JVD.     Trachea: No tracheal deviation.  Cardiovascular:     Rate and Rhythm: Normal rate and regular rhythm.     Heart sounds: Normal heart sounds. No murmur heard. No friction rub. No gallop.   Pulmonary:     Effort: Pulmonary effort is normal. No respiratory distress.     Breath sounds: Normal breath sounds. No wheezing or rales.  Abdominal:     Palpations: Abdomen is soft.  Musculoskeletal:     Cervical back: Normal range of motion and neck supple.     Right lower leg: Edema (trace) present.     Left lower leg: Edema (trace) present.  Lymphadenopathy:     Cervical: No cervical adenopathy.  Neurological:     General: No focal deficit present.     Mental Status: She is alert and oriented to person, place, and time.  Psychiatric:        Mood and Affect: Mood normal.        Behavior: Behavior normal.        Thought Content: Thought content normal.       No results found for any visits on 04/02/21.  Assessment & Plan     1. Gait instability Progressive instability.  Patient attempted to maintain activity at home safely Also on gabapentin for neuropathy 2. Recurrent major depressive disorder, in partial remission (HCC) Continue citalopram  3. Cramp and spasm Muscular pain and lumbar radiculopathy  4. Hardening of the aorta (main artery of the heart) (HCC) All risk factors treated  5. Essential (primary) hypertension On lisinopril.  Patient to get blood pressure cuff for home use  6. Cerebral artery occlusion with cerebral infarction [facility]) All risk factors treated.  Patient on atorvastatin 20   No follow-ups on file.      I, Megan Mans, MD, have reviewed all documentation for this visit. The documentation on 04/12/21 for the exam, diagnosis, procedures, and orders are all accurate and complete.    Topher Buenaventura Wendelyn Breslow, MD  [facility] [phone] (phone) [phone] (fax)  [facility]

## 2021-04-11 ENCOUNTER — Other Ambulatory Visit: Payer: Self-pay | Admitting: Physician Assistant

## 2021-04-11 DIAGNOSIS — F411 Generalized anxiety disorder: Secondary | ICD-10-CM

## 2021-04-11 NOTE — Telephone Encounter (Signed)
[facility] faxed refill request for the following medications:  1. omeprazole (PRILOSEC) 20 MG capsule  2. citalopram (CELEXA) 40 MG tablet  LOV: 04/02/21 Dr. Rosanna Randy Since Dr. Rosanna Randy is out of the office can another provider review refill request? Please advise. Thanks [employer]

## 2021-04-17 ENCOUNTER — Other Ambulatory Visit: Payer: Self-pay | Admitting: Family Medicine

## 2021-04-17 DIAGNOSIS — M1990 Unspecified osteoarthritis, unspecified site: Secondary | ICD-10-CM

## 2021-04-17 DIAGNOSIS — K219 Gastro-esophageal reflux disease without esophagitis: Secondary | ICD-10-CM

## 2021-04-17 MED ORDER — TRAMADOL HCL 50 MG PO TABS: ORAL_TABLET | ORAL | 1 refills | Status: DC

## 2021-04-17 MED ORDER — OMEPRAZOLE 20 MG PO CPDR: DELAYED_RELEASE_CAPSULE | ORAL | 1 refills | Status: DC

## 2021-04-17 NOTE — Telephone Encounter (Signed)
Copied from [place] [phone]. Topic: Quick Communication - Rx Refill/Question >> Apr 17, 2021 10:53 AM Robina Ade, Helene Kelp D wrote: Medication: omeprazole (PRILOSEC) 20 MG capsule, traMADol (ULTRAM) 50 MG tablet   Has the patient contacted their pharmacy? Yes.  Pt is completely out and doesn't see new PCP til June and she needs refill on these two medication. (Agent: If no, request that the patient contact the pharmacy for the refill.) (Agent: If yes, when and what did the pharmacy advise?)  Preferred Pharmacy (with phone number or street name): [id number] - [place], [place]: Please be advised that RX refills may take up to 3 business days. We ask that you follow-up with your pharmacy.

## 2021-04-17 NOTE — Telephone Encounter (Signed)
Requested medication (s) are due for refill today: yes  Requested medication (s) are on the active medication list: yes   Last refill:  10/23/2020  Future visit scheduled: yes   Notes to clinic:  this refill cannot be delegated    Requested Prescriptions  Pending Prescriptions Disp Refills   traMADol (ULTRAM) 50 MG tablet 180 tablet 1    Sig: TAKE 1 TABLET BY MOUTH TWICE DAILY AS NEEDED      Not Delegated - Analgesics:  Opioid Agonists Failed - 04/17/2021 11:04 AM      Failed - This refill cannot be delegated      Failed - Urine Drug Screen completed in last 360 days      Passed - Valid encounter within last 6 months    Recent Outpatient Visits           2 weeks ago Gait instability   [facility] [name], MD   4 weeks ago Leg cramps   [facility] [name], MD   2 months ago Edema, unspecified type   [facility] [place], [place], [place]   2 months ago Edema, unspecified type   [facility] Flinchum, Kelby Aline, FNP   3 months ago SOB (shortness of breath)   [facility], Clearnce Sorrel, [place]       Future Appointments             In 4 weeks Bacigalupo, [name], MD [facility]              Signed Prescriptions Disp Refills   omeprazole (PRILOSEC) 20 MG capsule 90 capsule 1    Sig: TAKE 1 CAPSULE(20 MG) BY MOUTH DAILY      Gastroenterology: Proton Pump Inhibitors Passed - 04/17/2021 11:04 AM      Passed - Valid encounter within last 12 months    Recent Outpatient Visits           2 weeks ago Gait instability   [facility] [name], MD   4 weeks ago Leg cramps   [facility] [name], MD   2 months ago Edema, unspecified type   [place], [place]   2 months ago Edema, unspecified type   [facility] Flinchum, Kelby Aline, FNP    3 months ago SOB (shortness of breath)   [facility], Clearnce Sorrel, [place]       Future Appointments             In 4 weeks Bacigalupo, [name], MD [facility], [place]

## 2021-04-26 DIAGNOSIS — L298 Other pruritus: Secondary | ICD-10-CM | POA: Diagnosis not present

## 2021-04-26 DIAGNOSIS — M7062 Trochanteric bursitis, left hip: Secondary | ICD-10-CM | POA: Diagnosis not present

## 2021-04-26 DIAGNOSIS — M5416 Radiculopathy, lumbar region: Secondary | ICD-10-CM | POA: Diagnosis not present

## 2021-04-26 DIAGNOSIS — M4726 Other spondylosis with radiculopathy, lumbar region: Secondary | ICD-10-CM | POA: Diagnosis not present

## 2021-04-26 DIAGNOSIS — M5136 Other intervertebral disc degeneration, lumbar region: Secondary | ICD-10-CM | POA: Diagnosis not present

## 2021-04-29 ENCOUNTER — Other Ambulatory Visit: Payer: Self-pay

## 2021-04-29 ENCOUNTER — Telehealth: Payer: Self-pay

## 2021-04-29 DIAGNOSIS — I10 Essential (primary) hypertension: Secondary | ICD-10-CM

## 2021-04-29 MED ORDER — LISINOPRIL 2.5 MG PO TABS: ORAL_TABLET | ORAL | 0 refills | Status: DC

## 2021-04-29 NOTE — Telephone Encounter (Signed)
[facility] faxed refill request for the following medications:   lisinopril (ZESTRIL) 2.5 MG tablet   Please advise.  

## 2021-05-14 DIAGNOSIS — M47816 Spondylosis without myelopathy or radiculopathy, lumbar region: Secondary | ICD-10-CM | POA: Diagnosis not present

## 2021-05-16 ENCOUNTER — Encounter: Payer: Self-pay | Admitting: Family Medicine

## 2021-05-16 ENCOUNTER — Ambulatory Visit (INDEPENDENT_AMBULATORY_CARE_PROVIDER_SITE_OTHER): Payer: Medicare Other | Admitting: Family Medicine

## 2021-05-16 ENCOUNTER — Other Ambulatory Visit: Payer: Self-pay

## 2021-05-16 VITALS — BP 120/39 | HR 69 | Temp 98.2°F | Resp 16 | Wt 184.1 lb

## 2021-05-16 DIAGNOSIS — E039 Hypothyroidism, unspecified: Secondary | ICD-10-CM | POA: Diagnosis not present

## 2021-05-16 DIAGNOSIS — I5033 Acute on chronic diastolic (congestive) heart failure: Secondary | ICD-10-CM

## 2021-05-16 DIAGNOSIS — R252 Cramp and spasm: Secondary | ICD-10-CM | POA: Diagnosis not present

## 2021-05-16 DIAGNOSIS — I10 Essential (primary) hypertension: Secondary | ICD-10-CM

## 2021-05-16 DIAGNOSIS — E782 Mixed hyperlipidemia: Secondary | ICD-10-CM | POA: Diagnosis not present

## 2021-05-16 DIAGNOSIS — I5030 Unspecified diastolic (congestive) heart failure: Secondary | ICD-10-CM | POA: Insufficient documentation

## 2021-05-16 DIAGNOSIS — D649 Anemia, unspecified: Secondary | ICD-10-CM

## 2021-05-16 MED ORDER — FUROSEMIDE 80 MG PO TABS: 80.0000 mg | ORAL_TABLET | Freq: Every day | ORAL | 5 refills | Status: DC

## 2021-05-16 NOTE — Patient Instructions (Signed)
Heart Failure Eating Plan Heart failure, also called congestive heart failure, occurs when your heart does not pump blood well enough to meet your body's needs for oxygen-rich blood. Heart failure is a long-term (chronic) condition. Living with heart failure can be challenging. Following your health care provider's instructions about a healthy lifestyle and working with a dietitian to choose the right foods may help to improve your symptoms. An eating plan for someone with heart failure will include changes that limit the intake of salt (sodium) and unhealthy fat. What are tips for following this plan? Reading food labels Check food labels for the amount of sodium per serving. Choose foods that have less than 140 mg (milligrams) of sodium in each serving. Check food labels for the number of calories per serving. This is important if you need to limit your daily calorie intake to lose weight. Check food labels for the serving size. If you eat more than one serving, you will be eating more sodium and calories than what is listed on the label. Look for foods that are labeled as "sodium-free," "very low sodium," or "low sodium." Foods labeled as "reduced sodium" or "lightly salted" may still have more sodium than what is recommended for you. Cooking Avoid adding salt when cooking. Ask your health care provider or dietitian before using salt substitutes. Season food with salt-free seasonings, spices, or herbs. Check the label of seasoning mixes to make sure they do not contain salt. Cook with heart-healthy oils, such as olive, canola, soybean, or sunflower oil. Do not fry foods. Cook foods using low-fat methods, such as baking, boiling, grilling, and broiling. Limit unhealthy fats when cooking by: Removing the skin from poultry, such as chicken. Removing all visible fats from meats. Skimming the fat off from stews, soups, and gravies before serving them. Meal planning  Limit your intake  of: Processed, canned, or prepackaged foods. Foods that are high in trans fat, such as fried foods. Sweets, desserts, sugary drinks, and other foods with added sugar. Full-fat dairy products, such as whole milk. Eat a balanced diet. This may include: 4-5 servings of fruit each day and 4-5 servings of vegetables each day. At each meal, try to fill one-half of your plate with fruits and vegetables. Up to 6-8 servings of whole grains each day. Up to 2 servings of lean meat, poultry, or fish each day. One serving of meat is equal to 3 oz (85 g). This is about the same size as a deck of cards. 2 servings of low-fat dairy each day. Heart-healthy fats. Healthy fats called omega-3 fatty acids are found in foods such as flaxseed and cold-water fish like sardines, salmon, and mackerel. Aim to eat 25-35 g (grams) of fiber a day. Foods that are high in fiber include apples, broccoli, carrots, beans, peas, and whole grains. Do not add salt or condiments that contain salt (such as soy sauce) to foods before eating. When eating at a restaurant, ask that your food be prepared with less salt or no salt, if possible. Try to eat 2 or more vegetarian meals each week. Eat more home-cooked food and eat less restaurant, buffet, and fast food. General information Do not eat more than 2,300 mg of sodium a day. The amount of sodium that is recommended for you may be lower, depending on your condition. Maintain a healthy body weight as directed. Ask your health care provider what a healthy weight is for you. Check your weight every day. Work with your health care provider   and dietitian to make a plan that is right for you to lose weight or maintain your current weight. Limit how much fluid you drink. Ask your health care provider or dietitian how much fluid you can have each day. Limit or avoid alcohol as told by your health care provider or dietitian. Recommended foods Fruits All fresh, frozen, and canned fruits.  Dried fruits, such as raisins, prunes, and cranberries. Vegetables All fresh vegetables. Vegetables that are frozen without sauce or added salt. Low-sodium or sodium-free canned vegetables. Grains Bread with less than 80 mg of sodium per slice. Whole-wheat pasta, quinoa, and brown rice. Oats and oatmeal. Barley. Millet. Grits and cream of wheat. Whole-grain and whole-wheat cold cereal. Meats and other protein foods Lean cuts of meat. Skinless chicken and turkey. Fish with high omega-3 fatty acids, such as salmon, sardines, and other cold-water fishes. Eggs. Dried beans, peas, and edamame. Unsalted nuts and nut butters. Dairy Low-fat or nonfat (skim) milk and dried milk. Rice milk, soy milk, and almond milk. Low-fat or nonfat yogurt. Small amounts of reduced-sodium block cheese. Low-sodium cottage cheese. Fats and oils Olive, canola, soybean, flaxseed, avocado, or sunflower oil. Sweets and desserts Applesauce. Granola bars. Sugar-free pudding and gelatin. Frozen fruit bars. Seasoning and other foods Fresh and dried herbs. Lemon or lime juice. Vinegar. Low-sodium ketchup. Salt-free marinades, salad dressings, sauces, and seasonings. The items listed above may not be a complete list of foods and beverages you can eat. Contact a dietitian for more information. Foods to avoid Fruits Fruits that are dried with sodium-containing preservatives. Vegetables Canned vegetables. Frozen vegetables with sauce or seasonings. Creamed vegetables. French fries. Onion rings. Pickled vegetables and sauerkraut. Grains Bread with more than 80 mg of sodium per slice. Hot or cold cereal with more than 140 mg sodium per serving. Salted pretzels and crackers. Prepackaged breadcrumbs. Bagels, croissants, and biscuits. Meats and other protein foods Ribs and chicken wings. Bacon, ham, pepperoni, bologna, salami, and packaged luncheon meats. Hot dogs, bratwurst, and sausage. Canned meat. Smoked meat and fish. Salted nuts  and seeds. Dairy Whole milk, half-and-half, and cream. Buttermilk. Processed cheese, cheese spreads, and cheese curds. Regular cottage cheese. Feta cheese. Shredded cheese. String cheese. Fats and oils Butter, lard, shortening, ghee, and bacon fat. Canned and packaged gravies. Seasoning and other foods Onion salt, garlic salt, table salt, and sea salt. Marinades. Regular salad dressings. Relishes, pickles, and olives. Meat flavorings and tenderizers, and bouillon cubes. Horseradish, ketchup, and mustard. Worcestershire sauce. Teriyaki sauce, soy sauce (including reduced sodium). Hot sauce and Tabasco sauce. Steak sauce, fish sauce, oyster sauce, and cocktail sauce. Taco seasonings. Barbecue sauce. Tartar sauce. The items listed above may not be a complete list of foods and beverages you should avoid. Contact a dietitian for more information. Summary A heart failure eating plan includes changes that limit your intake of sodium and unhealthy fat, and it may help you lose weight or maintain a healthy weight. Your health care provider may also recommend limiting how much fluid you drink. Most people with heart failure should eat no more than 2,300 mg of salt (sodium) a day. The amount of sodium that is recommended for you may be lower, depending on your condition. Contact your health care provider or dietitian before making any major changes to your diet. This information is not intended to replace advice given to you by your health care provider. Make sure you discuss any questions you have with your health care provider. Document Revised: 06/18/2020 Document Reviewed: 06/18/2020   Elsevier Patient Education  2022 [employer] Inc[employer]  

## 2021-05-16 NOTE — Progress Notes (Signed)
Established patient visit   Patient: Helen Ramsey   DOB: 10/09/1933   87 y.o. Female  MRN: [id number] Visit Date: 05/16/2021  Today's healthcare provider: Shirlee Latch, MD   Chief Complaint  Patient presents with   Hypertension   Hyperlipidemia   Hypothyroidism   Cough    Patient reports that she has had a non productive cough for two weeks and states that she believes that it might be due to fluid build up, she also reports dyspnea   Subjective    Hypertension Pertinent negatives include no chest pain, headaches, neck pain, palpitations or shortness of breath.  Hyperlipidemia Pertinent negatives include no chest pain, myalgias or shortness of breath.  Cough Pertinent negatives include no chest pain, chills, ear pain, fever, headaches, myalgias, sore throat, shortness of breath or wheezing.  HPI     Cough    Additional comments: Patient reports that she has had a non productive cough for two weeks and states that she believes that it might be due to fluid build up, she also reports dyspnea      Last edited by Fonda Kinder, CMA on 05/16/2021  1:21 PM.      Hypertension, follow-up  BP Readings from Last 3 Encounters:  05/16/21 (!) 120/39  04/02/21 (!) 115/58  03/26/21 103/64   Wt Readings from Last 3 Encounters:  05/16/21 184 lb 1.6 oz (83.5 kg)  04/02/21 181 lb (82.1 kg)  03/26/21 176 lb (79.8 kg)     She was last seen for hypertension 1 months ago.  BP at that visit was 115/58. Management since that visit includes; seen by Dr. Sullivan Lone. Advised to get a blood pressure cuff to check her bp at home. She reports excellent compliance with treatment. She is not having side effects.  She is not exercising. She is adherent to low salt diet.   Outside blood pressures are patient reports that she takes in morning. Her diastolic reading is typically in the 50s.   She does not smoke.  Use of agents associated with hypertension: NSAIDS.    --------------------------------------------------------------------------------------------------- Lipid/Cholesterol, follow-up  Last Lipid Panel: Lab Results  Component Value Date   CHOL 277 (H) 05/16/2021   LDLCALC 182 (H) 05/16/2021   HDL 31 (L) 05/16/2021   TRIG 323 (H) 05/16/2021    She was last seen for this 02/27/2020  Management since that visit includes; on 20 mg atorvastatin.  She reports excellent compliance with treatment. She is not having side effects.   She is following a Regular diet. Current exercise: no regular exercise  Last metabolic panel Lab Results  Component Value Date   GLUCOSE 96 05/16/2021   NA 132 (L) 05/16/2021   K 4.4 05/16/2021   BUN 17 05/16/2021   CREATININE 0.97 05/16/2021   GFRNONAA 53 (L) 03/26/2021   GFRAA 74 12/20/2020   CALCIUM 9.1 05/16/2021   AST 24 05/16/2021   ALT 19 05/16/2021   The ASCVD Risk score (Goff DC Jr., et al., 2013) failed to calculate for the following reasons:   The 2013 ASCVD risk score is only valid for ages 20 to 30   The patient has a prior MI or stroke diagnosis  --------------------------------------------------------------------------------------------------- Hypothyroid, follow-up  Lab Results  Component Value Date   TSH 3.900 05/16/2021   TSH 8.560 (H) 02/14/2021   TSH 6.870 (H) 01/22/2021   Wt Readings from Last 3 Encounters:  05/16/21 184 lb 1.6 oz (83.5 kg)  04/02/21 181 lb (82.1  kg)  03/26/21 176 lb (79.8 kg)    She was last seen for hypothyroid 3 months ago.  Management since that visit includes; started levothyroxine 25 mcg. She reports excellent compliance with treatment. She is not having side effects.   Symptoms: Yes change in energy level Yes constipation  No diarrhea No heat / cold intolerance  Yes nervousness No palpitations  Yes weight changes    Cough  She reports that she this is a nonproductive chronic cough. When she was seeing another provider she was prescribed  lasix that resolved the cough for some time. However the cough returned 2 weeks ago.  Fluid retention  She takes the 50 mg in the morning and 20 mg in the afternoon of lasix. She continues to retain fluid. She has difficulty urinating and LE swelling.   Sleep apnea  She is using the nasal cannula for half the night to improve her sleep. She has some difficulty sleeping the entire night wearing it. However she is tolerating using the nasal cannula   Spasms/Cramps She reports having spasms or cramps in her right hand. Or when she attempts to stand she has pain in her right leg. Dr. Sullivan Lone recommended she take a 1 tablespoon of mustard to for relief.  Anxiety and Depression She takes her xanax as needed for her depression and anxiety. However she has days were she fidgety and can't sit still and she will take her xanax and symptoms reside.   Vaccines  She has received 3 COVID vaccines and is due for the 4th booster. She is concerned whether she should receive another shingles vaccine. -----------------------------------------------------------------------------------------  Social History   Tobacco Use   Smoking status: Never   Smokeless tobacco: Never  Vaping Use   Vaping Use: Never used  Substance Use Topics   Alcohol use: No   Drug use: No    Medications: Outpatient Medications Prior to Visit  Medication Sig Note   acetaminophen (TYLENOL) 500 MG tablet Take 500 mg by mouth in the morning and at bedtime.     ALPRAZolam (XANAX) 0.25 MG tablet TAKE ONE TABLET BY MOUTH TWO TIMES A DAY AS NEEDED FOR ANXIETY    aspirin EC 81 MG tablet Take 81 mg by mouth daily.    atorvastatin (LIPITOR) 20 MG tablet Take 1 tablet (20 mg total) by mouth daily.    cetirizine (ZYRTEC) 10 MG tablet Take 10 mg daily by mouth.    Cholecalciferol (VITAMIN D3 PO) Take 1,000 Units by mouth.    citalopram (CELEXA) 40 MG tablet TAKE 1 TABLET BY MOUTH EVERY DAY    cyanocobalamin 1000 MCG tablet Take 1,000 mcg  by mouth daily.    Dentifrices (FLUORIDE TOOTHPASTE DT) Place onto teeth daily.     EMGALITY, 300 MG DOSE, 100 MG/ML SOSY Inject into the skin.    famotidine (PEPCID) 20 MG tablet Take 1 tablet (20 mg total) by mouth daily.    fluticasone furoate-vilanterol (BREO ELLIPTA) 100-25 MCG/INH AEPB Inhale 1 puff into the lungs daily.    gabapentin (NEURONTIN) 100 MG capsule Start Gabapentin 200 mg (one tablet) in the morning, 200 mg in the afternoon (one tablet), and 300 mg in the evening.    gabapentin (NEURONTIN) 300 MG capsule Take 300 mg by mouth at bedtime.    Galcanezumab-gnlm (EMGALITY Buda) Inject 300 mg as directed every 30 (thirty) days.     levothyroxine (SYNTHROID) 25 MCG tablet Take 1 tablet (25 mcg total) by mouth daily before breakfast.  MAGNESIUM GLYCINATE PO Take 500 mg by mouth daily.    melatonin 5 MG TABS Take 5 mg by mouth at bedtime.     Omega-3 Fatty Acids (FISH OIL) 1200 MG CAPS Take 2 capsules by mouth daily.     omeprazole (PRILOSEC) 20 MG capsule TAKE 1 CAPSULE(20 MG) BY MOUTH DAILY    phenazopyridine (AZO-TABS) 95 MG tablet Take 1 tablet (95 mg total) by mouth as needed. 05/16/2021: Patient reports that she takes twice a day   Polyethylene Glycol 3350 (MIRALAX PO) Take by mouth.    potassium gluconate 595 (99 K) MG TABS tablet Take 1 tablet (595 mg total) by mouth 2 (two) times daily.    sodium chloride (OCEAN) 0.65 % nasal spray Place 1 spray into the nose as needed.     traMADol (ULTRAM) 50 MG tablet TAKE 1 TABLET BY MOUTH TWICE DAILY AS NEEDED    [DISCONTINUED] furosemide (LASIX) 20 MG tablet Take 2 tablets (40 mg total) by mouth daily. May take extra tab if weight up greater 3 pounds    [DISCONTINUED] Calcium Carbonate-Vitamin D 600-400 MG-UNIT tablet Take 1 tablet by mouth 2 (two) times daily. (Patient not taking: Reported on 03/19/2021)    [DISCONTINUED] lisinopril (ZESTRIL) 2.5 MG tablet TAKE 1 TABLET BY MOUTH DAILY    No facility-administered medications prior to  visit.   Review of Systems  Constitutional:  Negative for appetite change, chills, fatigue and fever.  HENT:  Negative for ear pain, sinus pressure, sinus pain and sore throat.   Eyes:  Negative for pain.  Respiratory:  Positive for cough. Negative for chest tightness, shortness of breath and wheezing.   Cardiovascular:  Positive for leg swelling. Negative for chest pain and palpitations.  Gastrointestinal:  Negative for abdominal pain, blood in stool, diarrhea, nausea and vomiting.  Genitourinary:  Negative for flank pain, frequency, pelvic pain and urgency.  Musculoskeletal:  Negative for back pain, myalgias and neck pain.  Neurological:  Negative for dizziness, weakness, light-headedness, numbness and headaches.   Last metabolic panel Lab Results  Component Value Date   GLUCOSE 96 05/16/2021   NA 132 (L) 05/16/2021   K 4.4 05/16/2021   CL 95 (L) 05/16/2021   CO2 26 05/16/2021   BUN 17 05/16/2021   CREATININE 0.97 05/16/2021   GFRNONAA 53 (L) 03/26/2021   GFRAA 74 12/20/2020   CALCIUM 9.1 05/16/2021   PROT 6.6 05/16/2021   ALBUMIN 3.9 05/16/2021   LABGLOB 2.7 05/16/2021   AGRATIO 1.4 05/16/2021   BILITOT 0.4 05/16/2021   ALKPHOS 70 05/16/2021   AST 24 05/16/2021   ALT 19 05/16/2021   ANIONGAP 7 03/26/2021   Last lipids Lab Results  Component Value Date   CHOL 277 (H) 05/16/2021   HDL 31 (L) 05/16/2021   LDLCALC 182 (H) 05/16/2021   TRIG 323 (H) 05/16/2021   CHOLHDL 8.9 (H) 05/16/2021   Last vitamin D Lab Results  Component Value Date   VD25OH 38.6 02/27/2020     Objective    BP (!) 120/39   Pulse 69   Temp 98.2 F (36.8 C) (Oral)   Resp 16   Wt 184 lb 1.6 oz (83.5 kg)   SpO2 97%   BMI 35.95 kg/m     Physical Exam Vitals reviewed.  Constitutional:      General: She is not in acute distress.    Appearance: Normal appearance. She is well-developed. She is not diaphoretic.  HENT:     Head: Normocephalic and atraumatic.  Eyes:     General: No  scleral icterus.    Conjunctiva/sclera: Conjunctivae normal.  Neck:     Thyroid: No thyromegaly.  Cardiovascular:     Rate and Rhythm: Normal rate and regular rhythm.     Heart sounds: Normal heart sounds. No murmur heard. Pulmonary:     Effort: Pulmonary effort is normal. No respiratory distress.     Breath sounds: Normal breath sounds. No wheezing, rhonchi or rales.     Comments: Crackles in both bases  Musculoskeletal:     Cervical back: Neck supple.     Right lower leg: 1+ Edema present.     Left lower leg: 1+ Edema present.  Lymphadenopathy:     Cervical: No cervical adenopathy.  Skin:    General: Skin is warm and dry.     Findings: No rash.  Neurological:     Mental Status: She is alert and oriented to person, place, and time. Mental status is at baseline.  Psychiatric:        Mood and Affect: Mood normal.        Behavior: Behavior normal.    Results for orders placed or performed in visit on 05/16/21  TSH  Result Value Ref Range   TSH 3.900 0.450 - 4.500 uIU/mL  Comprehensive metabolic panel  Result Value Ref Range   Glucose 96 65 - 99 mg/dL   BUN 17 8 - 27 mg/dL   Creatinine, Ser 1.61 0.57 - 1.00 mg/dL   eGFR 57 (L) >09 UE/AVW/0.98   BUN/Creatinine Ratio 18 12 - 28   Sodium 132 (L) 134 - 144 mmol/L   Potassium 4.4 3.5 - 5.2 mmol/L   Chloride 95 (L) 96 - 106 mmol/L   CO2 26 20 - 29 mmol/L   Calcium 9.1 8.7 - 10.3 mg/dL   Total Protein 6.6 6.0 - 8.5 g/dL   Albumin 3.9 3.6 - 4.6 g/dL   Globulin, Total 2.7 1.5 - 4.5 g/dL   Albumin/Globulin Ratio 1.4 1.2 - 2.2   Bilirubin Total 0.4 0.0 - 1.2 mg/dL   Alkaline Phosphatase 70 44 - 121 IU/L   AST 24 0 - 40 IU/L   ALT 19 0 - 32 IU/L  Lipid panel  Result Value Ref Range   Cholesterol, Total 277 (H) 100 - 199 mg/dL   Triglycerides 119 (H) 0 - 149 mg/dL   HDL 31 (L) >14 mg/dL   VLDL Cholesterol Cal 64 (H) 5 - 40 mg/dL   LDL Chol Calc (NIH) 782 (H) 0 - 99 mg/dL   Chol/HDL Ratio 8.9 (H) 0.0 - 4.4 ratio  CBC  w/Diff/Platelet  Result Value Ref Range   WBC 6.3 3.4 - 10.8 x10E3/uL   RBC 3.62 (L) 3.77 - 5.28 x10E6/uL   Hemoglobin 11.0 (L) 11.1 - 15.9 g/dL   Hematocrit 95.6 (L) 21.3 - 46.6 %   MCV 92 79 - 97 fL   MCH 30.4 26.6 - 33.0 pg   MCHC 33.1 31.5 - 35.7 g/dL   RDW 08.6 57.8 - 46.9 %   Platelets 272 150 - 450 x10E3/uL   Neutrophils 54 Not Estab. %   Lymphs 18 Not Estab. %   Monocytes 11 Not Estab. %   Eos 15 Not Estab. %   Basos 1 Not Estab. %   Neutrophils Absolute 3.5 1.4 - 7.0 x10E3/uL   Lymphocytes Absolute 1.1 0.7 - 3.1 x10E3/uL   Monocytes Absolute 0.7 0.1 - 0.9 x10E3/uL   EOS (ABSOLUTE) 0.9 (H) 0.0 -  0.4 x10E3/uL   Basophils Absolute 0.1 0.0 - 0.2 x10E3/uL   Immature Granulocytes 1 Not Estab. %   Immature Grans (Abs) 0.0 0.0 - 0.1 x10E3/uL  Magnesium  Result Value Ref Range   Magnesium 2.1 1.6 - 2.3 mg/dL    Assessment & Plan     Problem List Items Addressed This Visit       Cardiovascular and Mediastinum   Essential (primary) hypertension    Ok today but low diastolic BP Watch it as we adjust lasix       Relevant Medications   furosemide (LASIX) 80 MG tablet   Other Relevant Orders   Comprehensive metabolic panel (Completed)   Acute on chronic heart failure with preserved ejection fraction (HCC) - Primary    discussed her Echo results showing pEF, but grade 2 diastolic dysfunction Discussed dose dependence/threshold for lasix 60mg  does not seem to be helping, so will increase to 80mg  daily Check metabolic panel and recheck in 2 weeks May need K supplement Watch BP after increasing        Relevant Medications   furosemide (LASIX) 80 MG tablet   Other Relevant Orders   Comprehensive metabolic panel (Completed)     Endocrine   Acquired hypothyroidism    Previously well controlled Continue Synthroid at current dose  Recheck TSH and adjust Synthroid as indicated         Relevant Orders   TSH (Completed)     Other   Anemia, unspecified    Recheck  CBC       Relevant Orders   CBC w/Diff/Platelet (Completed)   Mixed hyperlipidemia    Previously well controlled Continue statin Repeat FLP and CMP        Relevant Medications   furosemide (LASIX) 80 MG tablet   Other Relevant Orders   Comprehensive metabolic panel (Completed)   Lipid panel (Completed)   Other Visit Diagnoses     Muscle cramp       Relevant Orders   Comprehensive metabolic panel (Completed)   Magnesium (Completed)       Return in about 3 months (around 08/16/2021) for chronic disease f/u.      Total time spent on today's visit was greater than 45 minutes, including both face-to-face time and nonface-to-face time personally spent on review of chart (labs and imaging), discussing labs and goals, discussing further work-up, treatment options, referrals to specialist if needed, reviewing outside records of pertinent, answering patient's questions, and coordinating care.    I,Essence Turner,acting as a Neurosurgeon for Shirlee Latch, MD.,have documented all relevant documentation on the behalf of Shirlee Latch, MD,as directed by  Shirlee Latch, MD while in the presence of Shirlee Latch, MD.   I, Shirlee Latch, MD, have reviewed all documentation for this visit. The documentation on 05/17/21 for the exam, diagnosis, procedures, and orders are all accurate and complete.   Bernadene Garside, Marzella Schlein, MD, MPH [facility]

## 2021-05-17 DIAGNOSIS — E039 Hypothyroidism, unspecified: Secondary | ICD-10-CM | POA: Insufficient documentation

## 2021-05-17 LAB — CBC WITH DIFFERENTIAL/PLATELET
Basophils Absolute: 0.1 10*3/uL (ref 0.0–0.2)
Basos: 1 %
EOS (ABSOLUTE): 0.9 10*3/uL — ABNORMAL HIGH (ref 0.0–0.4)
Eos: 15 %
Hematocrit: 33.2 % — ABNORMAL LOW (ref 34.0–46.6)
Hemoglobin: 11 g/dL — ABNORMAL LOW (ref 11.1–15.9)
Immature Grans (Abs): 0 10*3/uL (ref 0.0–0.1)
Immature Granulocytes: 1 %
Lymphocytes Absolute: 1.1 10*3/uL (ref 0.7–3.1)
Lymphs: 18 %
MCH: 30.4 pg (ref 26.6–33.0)
MCHC: 33.1 g/dL (ref 31.5–35.7)
MCV: 92 fL (ref 79–97)
Monocytes Absolute: 0.7 10*3/uL (ref 0.1–0.9)
Monocytes: 11 %
Neutrophils Absolute: 3.5 10*3/uL (ref 1.4–7.0)
Neutrophils: 54 %
Platelets: 272 10*3/uL (ref 150–450)
RBC: 3.62 x10E6/uL — ABNORMAL LOW (ref 3.77–5.28)
RDW: 13 % (ref 11.7–15.4)
WBC: 6.3 10*3/uL (ref 3.4–10.8)

## 2021-05-17 LAB — COMPREHENSIVE METABOLIC PANEL
ALT: 19 IU/L (ref 0–32)
AST: 24 IU/L (ref 0–40)
Albumin/Globulin Ratio: 1.4 (ref 1.2–2.2)
Albumin: 3.9 g/dL (ref 3.6–4.6)
Alkaline Phosphatase: 70 IU/L (ref 44–121)
BUN/Creatinine Ratio: 18 (ref 12–28)
BUN: 17 mg/dL (ref 8–27)
Bilirubin Total: 0.4 mg/dL (ref 0.0–1.2)
CO2: 26 mmol/L (ref 20–29)
Calcium: 9.1 mg/dL (ref 8.7–10.3)
Chloride: 95 mmol/L — ABNORMAL LOW (ref 96–106)
Creatinine, Ser: 0.97 mg/dL (ref 0.57–1.00)
Globulin, Total: 2.7 g/dL (ref 1.5–4.5)
Glucose: 96 mg/dL (ref 65–99)
Potassium: 4.4 mmol/L (ref 3.5–5.2)
Sodium: 132 mmol/L — ABNORMAL LOW (ref 134–144)
Total Protein: 6.6 g/dL (ref 6.0–8.5)
eGFR: 57 mL/min/{1.73_m2} — ABNORMAL LOW (ref >59)

## 2021-05-17 LAB — LIPID PANEL
Chol/HDL Ratio: 8.9 ratio — ABNORMAL HIGH (ref 0.0–4.4)
Cholesterol, Total: 277 mg/dL — ABNORMAL HIGH (ref 100–199)
HDL: 31 mg/dL — ABNORMAL LOW (ref >39)
LDL Chol Calc (NIH): 182 mg/dL — ABNORMAL HIGH (ref 0–99)
Triglycerides: 323 mg/dL — ABNORMAL HIGH (ref 0–149)
VLDL Cholesterol Cal: 64 mg/dL — ABNORMAL HIGH (ref 5–40)

## 2021-05-17 LAB — TSH: TSH: 3.9 u[IU]/mL (ref 0.450–4.500)

## 2021-05-17 LAB — MAGNESIUM: Magnesium: 2.1 mg/dL (ref 1.6–2.3)

## 2021-05-17 NOTE — Assessment & Plan Note (Signed)
Ok today but low diastolic BP Watch it as we adjust lasix

## 2021-05-17 NOTE — Assessment & Plan Note (Signed)
Recheck CBC. 

## 2021-05-17 NOTE — Assessment & Plan Note (Signed)
Previously well controlled Continue statin Repeat FLP and CMP  

## 2021-05-17 NOTE — Assessment & Plan Note (Signed)
discussed her Echo results showing pEF, but grade 2 diastolic dysfunction Discussed dose dependence/threshold for lasix 60mg  does not seem to be helping, so will increase to 80mg  daily Check metabolic panel and recheck in 2 weeks May need K supplement Watch BP after increasing

## 2021-05-17 NOTE — Assessment & Plan Note (Signed)
Previously well controlled Continue Synthroid at current dose  Recheck TSH and adjust Synthroid as indicated   

## 2021-05-30 DIAGNOSIS — M47816 Spondylosis without myelopathy or radiculopathy, lumbar region: Secondary | ICD-10-CM | POA: Diagnosis not present

## 2021-06-05 DIAGNOSIS — M7062 Trochanteric bursitis, left hip: Secondary | ICD-10-CM | POA: Diagnosis not present

## 2021-06-05 DIAGNOSIS — M5136 Other intervertebral disc degeneration, lumbar region: Secondary | ICD-10-CM | POA: Diagnosis not present

## 2021-06-05 DIAGNOSIS — M4726 Other spondylosis with radiculopathy, lumbar region: Secondary | ICD-10-CM | POA: Diagnosis not present

## 2021-06-05 DIAGNOSIS — M5416 Radiculopathy, lumbar region: Secondary | ICD-10-CM | POA: Diagnosis not present

## 2021-06-05 DIAGNOSIS — Z79899 Other long term (current) drug therapy: Secondary | ICD-10-CM | POA: Diagnosis not present

## 2021-06-18 ENCOUNTER — Telehealth: Payer: Self-pay

## 2021-06-18 NOTE — Telephone Encounter (Signed)
Copied from [place] [phone]. Topic: General - Other >> Jun 18, 2021 10:22 AM Celene Kras wrote: Reason for CRM: Pts daughter in law, Jozalyn, calling stating that the pt has gained 3lbs. She states that this happened over night and has been asked by PCP to update if she seems to be gaining weight rapidly. Please advise.

## 2021-06-18 NOTE — Telephone Encounter (Signed)
Ulla Potash as below. She reports that the patient just had a echocardiogram with Dr Clayborn Bigness in 03/2021. Do you want her to be seen again? Please advise. Thanks!

## 2021-06-18 NOTE — Telephone Encounter (Signed)
Can take an extra dose of Lasix. Watch BP for possible low after taking this. Recommend seeing Cardiology also for her heart failure.

## 2021-06-18 NOTE — Telephone Encounter (Signed)
Please review. Thanks!  

## 2021-06-20 ENCOUNTER — Ambulatory Visit: Payer: Self-pay

## 2021-06-20 DIAGNOSIS — I10 Essential (primary) hypertension: Secondary | ICD-10-CM | POA: Diagnosis not present

## 2021-06-20 DIAGNOSIS — M48062 Spinal stenosis, lumbar region with neurogenic claudication: Secondary | ICD-10-CM | POA: Diagnosis not present

## 2021-06-20 DIAGNOSIS — G8929 Other chronic pain: Secondary | ICD-10-CM | POA: Diagnosis not present

## 2021-06-20 DIAGNOSIS — Z9049 Acquired absence of other specified parts of digestive tract: Secondary | ICD-10-CM | POA: Diagnosis not present

## 2021-06-20 DIAGNOSIS — D649 Anemia, unspecified: Secondary | ICD-10-CM | POA: Diagnosis not present

## 2021-06-20 DIAGNOSIS — K219 Gastro-esophageal reflux disease without esophagitis: Secondary | ICD-10-CM | POA: Diagnosis not present

## 2021-06-20 DIAGNOSIS — G629 Polyneuropathy, unspecified: Secondary | ICD-10-CM | POA: Diagnosis not present

## 2021-06-20 DIAGNOSIS — Z9181 History of falling: Secondary | ICD-10-CM | POA: Diagnosis not present

## 2021-06-20 DIAGNOSIS — Z86000 Personal history of in-situ neoplasm of breast: Secondary | ICD-10-CM | POA: Diagnosis not present

## 2021-06-20 DIAGNOSIS — F32A Depression, unspecified: Secondary | ICD-10-CM | POA: Diagnosis not present

## 2021-06-20 DIAGNOSIS — M1991 Primary osteoarthritis, unspecified site: Secondary | ICD-10-CM | POA: Diagnosis not present

## 2021-06-20 DIAGNOSIS — E785 Hyperlipidemia, unspecified: Secondary | ICD-10-CM | POA: Diagnosis not present

## 2021-06-20 DIAGNOSIS — Z96651 Presence of right artificial knee joint: Secondary | ICD-10-CM | POA: Diagnosis not present

## 2021-06-20 DIAGNOSIS — Z7982 Long term (current) use of aspirin: Secondary | ICD-10-CM | POA: Diagnosis not present

## 2021-06-20 DIAGNOSIS — G2581 Restless legs syndrome: Secondary | ICD-10-CM | POA: Diagnosis not present

## 2021-06-20 DIAGNOSIS — Z7951 Long term (current) use of inhaled steroids: Secondary | ICD-10-CM | POA: Diagnosis not present

## 2021-06-20 DIAGNOSIS — M5137 Other intervertebral disc degeneration, lumbosacral region: Secondary | ICD-10-CM | POA: Diagnosis not present

## 2021-06-20 DIAGNOSIS — F419 Anxiety disorder, unspecified: Secondary | ICD-10-CM | POA: Diagnosis not present

## 2021-06-20 DIAGNOSIS — G4730 Sleep apnea, unspecified: Secondary | ICD-10-CM | POA: Diagnosis not present

## 2021-06-20 DIAGNOSIS — K589 Irritable bowel syndrome without diarrhea: Secondary | ICD-10-CM | POA: Diagnosis not present

## 2021-06-20 NOTE — Telephone Encounter (Signed)
Patient current BP 130/53, yesterday her weight was 186.4, her weight today is 184.8. requesting to speak with a nurse   [name] reports weight down "some after taking Lasix yesterday." PT is coming to her home today for an evaluation. Still complaining of body pain - back, hips and legs. No chest pain or shortness of breath.Would like further recommendations from PCP.Please advise.

## 2021-06-20 NOTE — Telephone Encounter (Signed)
The extra dose of Lasix was to get fluid off and get weight to come down. Continue with daily Lasix dose now.  If having new symptoms (body pain), would need appt to be evaluated

## 2021-06-20 NOTE — Telephone Encounter (Signed)
OK for verbals 

## 2021-06-20 NOTE — Telephone Encounter (Signed)
Also, ok for orders? Please advise. Thanks!

## 2021-06-20 NOTE — Telephone Encounter (Signed)
Copied from [place] [id number]. Topic: Quick Communication - Home Health Verbal Orders >> Jun 20, 2021  3:05 PM Tessa Lerner A wrote: Caller/Agency: [facility]  Callback Number: [phone]  Requesting OT/PT/Skilled Nursing/Social Work/Speech Therapy: skilled nursing   Frequency: evaluation

## 2021-06-20 NOTE — Telephone Encounter (Signed)
Rochelle advised.   Thanks,   -Mickel Baas

## 2021-06-25 DIAGNOSIS — M5137 Other intervertebral disc degeneration, lumbosacral region: Secondary | ICD-10-CM | POA: Diagnosis not present

## 2021-06-25 DIAGNOSIS — E785 Hyperlipidemia, unspecified: Secondary | ICD-10-CM | POA: Diagnosis not present

## 2021-06-25 DIAGNOSIS — G4730 Sleep apnea, unspecified: Secondary | ICD-10-CM | POA: Diagnosis not present

## 2021-06-25 DIAGNOSIS — G8929 Other chronic pain: Secondary | ICD-10-CM | POA: Diagnosis not present

## 2021-06-25 DIAGNOSIS — G2581 Restless legs syndrome: Secondary | ICD-10-CM | POA: Diagnosis not present

## 2021-06-25 DIAGNOSIS — M48062 Spinal stenosis, lumbar region with neurogenic claudication: Secondary | ICD-10-CM | POA: Diagnosis not present

## 2021-06-25 NOTE — Telephone Encounter (Signed)
Message from 06/20/21 reviewed with pt's daughter [name]. States she had heard from practice regarding that message the next day. NT did not see any other message, results. Please review. [name] said to [facility] only if additional results.  Also would like PCP to know pt had PT and Nurse eval. States " The nurse states rash under belly fold is a heat rash."

## 2021-06-26 NOTE — Telephone Encounter (Signed)
Noted  

## 2021-06-27 ENCOUNTER — Emergency Department: Payer: Medicare Other

## 2021-06-27 ENCOUNTER — Emergency Department
Admission: EM | Admit: 2021-06-27 | Discharge: 2021-06-27 | Disposition: A | Discharge status: Home / Self Care | Payer: Medicare Other | Attending: Emergency Medicine | Admitting: Emergency Medicine

## 2021-06-27 ENCOUNTER — Encounter: Payer: Self-pay | Admitting: Emergency Medicine

## 2021-06-27 ENCOUNTER — Ambulatory Visit: Payer: Self-pay | Admitting: *Deleted

## 2021-06-27 ENCOUNTER — Other Ambulatory Visit: Payer: Self-pay

## 2021-06-27 DIAGNOSIS — W01198A Fall on same level from slipping, tripping and stumbling with subsequent striking against other object, initial encounter: Secondary | ICD-10-CM | POA: Diagnosis not present

## 2021-06-27 DIAGNOSIS — S199XXA Unspecified injury of neck, initial encounter: Secondary | ICD-10-CM | POA: Diagnosis present

## 2021-06-27 DIAGNOSIS — S12401A Unspecified nondisplaced fracture of fifth cervical vertebra, initial encounter for closed fracture: Secondary | ICD-10-CM | POA: Insufficient documentation

## 2021-06-27 DIAGNOSIS — S0990XA Unspecified injury of head, initial encounter: Secondary | ICD-10-CM | POA: Diagnosis not present

## 2021-06-27 DIAGNOSIS — R52 Pain, unspecified: Secondary | ICD-10-CM

## 2021-06-27 DIAGNOSIS — Z7982 Long term (current) use of aspirin: Secondary | ICD-10-CM | POA: Diagnosis not present

## 2021-06-27 DIAGNOSIS — S0083XA Contusion of other part of head, initial encounter: Secondary | ICD-10-CM | POA: Diagnosis not present

## 2021-06-27 DIAGNOSIS — I10 Essential (primary) hypertension: Secondary | ICD-10-CM | POA: Insufficient documentation

## 2021-06-27 DIAGNOSIS — Z79899 Other long term (current) drug therapy: Secondary | ICD-10-CM | POA: Diagnosis not present

## 2021-06-27 DIAGNOSIS — S12001A Unspecified nondisplaced fracture of first cervical vertebra, initial encounter for closed fracture: Secondary | ICD-10-CM | POA: Diagnosis not present

## 2021-06-27 DIAGNOSIS — R918 Other nonspecific abnormal finding of lung field: Secondary | ICD-10-CM | POA: Diagnosis not present

## 2021-06-27 DIAGNOSIS — Z96651 Presence of right artificial knee joint: Secondary | ICD-10-CM | POA: Diagnosis not present

## 2021-06-27 DIAGNOSIS — M4312 Spondylolisthesis, cervical region: Secondary | ICD-10-CM | POA: Diagnosis not present

## 2021-06-27 DIAGNOSIS — R519 Headache, unspecified: Secondary | ICD-10-CM | POA: Insufficient documentation

## 2021-06-27 DIAGNOSIS — I6529 Occlusion and stenosis of unspecified carotid artery: Secondary | ICD-10-CM | POA: Diagnosis not present

## 2021-06-27 DIAGNOSIS — M50221 Other cervical disc displacement at C4-C5 level: Secondary | ICD-10-CM | POA: Diagnosis not present

## 2021-06-27 DIAGNOSIS — Z043 Encounter for examination and observation following other accident: Secondary | ICD-10-CM | POA: Diagnosis not present

## 2021-06-27 DIAGNOSIS — M25552 Pain in left hip: Secondary | ICD-10-CM | POA: Diagnosis not present

## 2021-06-27 DIAGNOSIS — W19XXXA Unspecified fall, initial encounter: Secondary | ICD-10-CM

## 2021-06-27 DIAGNOSIS — M25551 Pain in right hip: Secondary | ICD-10-CM | POA: Diagnosis not present

## 2021-06-27 DIAGNOSIS — S129XXA Fracture of neck, unspecified, initial encounter: Secondary | ICD-10-CM | POA: Diagnosis not present

## 2021-06-27 NOTE — Telephone Encounter (Signed)
Noted  

## 2021-06-27 NOTE — Telephone Encounter (Signed)
Pt's daughter [name] calling, pt not present. States pt fell yesterday AM at 0330, did not use MedAlert call system until 0430. States EMS arrived, pt refused ED,refused VS. [name] states this AM pt reports she can barely touch left leg, "Terrible pain." Also has "Knot on top of head." Advised ED, states will follow disposition. Advised to not have pt bear weight on left leg, call EMS if needed. Verbalizes understanding.   Also states would like to drop off form to renew pt's handicapped placard.  Reason for Disposition  Injury (or injuries) that need emergency care  Protocols used: Falls and [facility]

## 2021-06-27 NOTE — ED Notes (Signed)
[place] c-collar applied to pt in CT then transported back to ER room.

## 2021-06-27 NOTE — ED Triage Notes (Signed)
Pt comes into the ED via POV c/o fall yesterday.  Pt states it was a mechanical fall after trying to get up off the commode.  Pt states she did hit her head and she states she has a hematoma on the left side of her head.  PT denies any blood thinner and denies any LOC.  Pt does have some bruising noted to the left side of her forehead.  PT neurologically intact at this time and in NAD.

## 2021-06-27 NOTE — ED Provider Notes (Signed)
[facility] Emergency Department Provider Note  ____________________________________________   Event Date/Time   First MD Initiated Contact with Patient 06/27/21 1041     (approximate)  I have reviewed the triage vital signs and the nursing notes.   HISTORY  Chief Complaint Fall    HPI Helen Ramsey is a 87 y.o. female with past medical history as below here with headache and facial pain after fall.  Patient states that she was getting up off her toilet yesterday evening.  She fell forward because her walker was not properly secured.  She fell onto her face.  Denies loss of consciousness.  She hit her life alert and EMS was called, came to stand her up and she walked to the other room.  She states she otherwise felt well so declined transport at that time.  She states that over the last 24 hours, she has had aching, throbbing, generalized headache as well as pain in her paraspinal, right greater than left, neck.  Denies any upper extremity numbness or weakness.  Denies any blood thinner use other than aspirin.  Denies any seizures.  No numbness or weakness.  No other complaints.   Past Medical History:  Diagnosis Date   Allergic rhinitis, cause unspecified    Allergic rhinitis, cause unspecified    Atherosclerosis of aorta (HCC)    Back pain    lumbar   Degeneration of lumbar or lumbosacral intervertebral disc    Diverticulitis of colon (without mention of hemorrhage)(562.11)    Dizziness and giddiness    Dysthymic disorder    Headache(784.0)    Myalgia and myositis, unspecified    Osteoporosis, unspecified    Other and unspecified hyperlipidemia    Other diseases of nasal cavity and sinuses(478.19)    Other specified disorders of bladder    Syncope    Trigger finger (acquired)    Unspecified essential hypertension    Unspecified sleep apnea    Urinary frequency     Patient Active Problem List   Diagnosis Date Noted   Acquired  hypothyroidism 05/17/2021   Acute on chronic heart failure with preserved ejection fraction ([place]) 05/16/2021   Edema 01/26/2021   Fatigue due to excessive exertion 01/26/2021   History of dyspnea 01/26/2021   Recurrent major depressive disorder, in partial remission ([place]) 02/27/2020   Bilateral myofascial pain 06/01/2019   Bilateral occipital neuralgia 06/01/2019   Age-related osteoporosis without current pathological fracture 08/16/2018   Gait instability 08/16/2018   Blurred vision, right eye 08/16/2018   Ocular pain, right eye 08/16/2018   Dorsalgia 03/31/2016   H/O total knee replacement 02/14/2016   Vitamin D deficiency 11/29/2015   Allergy to other foods 11/29/2015   Leg cramps 10/24/2015   Knee pain, bilateral 08/29/2015   Sleep apnea 05/30/2015   Mixed hyperlipidemia 05/09/2015   Restless legs syndrome 05/09/2015   Allergic rhinitis 03/28/2015   Anemia, unspecified 03/28/2015   Neck pain 03/28/2015   Chronic headache 03/28/2015   DDD (degenerative disc disease), lumbar 03/28/2015   Gastro-esophageal reflux disease without esophagitis 03/28/2015   Familial multiple lipoprotein-type hyperlipidemia 03/28/2015   Essential (primary) hypertension 03/28/2015   Adaptive colitis 03/28/2015   OP (osteoporosis) 03/28/2015   Nasal septal perforation 03/28/2015   Spinal stenosis 03/28/2015   Gastric ulcer without hemorrhage or perforation 03/28/2015   Cerebral artery occlusion with cerebral infarction ([place]) 03/28/2015   Chronic sinusitis 03/28/2015   Connective tissue and disc stenosis of intervertebral foramina of abdomen and other regions 03/28/2015  Diverticulosis of large intestine without perforation or abscess without bleeding 03/28/2015   Osteoarthritis 03/28/2015   Other specified functional intestinal disorders 03/28/2015   Hardening of the aorta (main artery of the heart) ([place]) 07/13/2014   H/O adenomatous polyp of colon 06/27/2005    Past Surgical History:   Procedure Laterality Date   ABDOMINAL HYSTERECTOMY     ADENOIDECTOMY     APPENDECTOMY     ARTHROSCOPIC KNEE SURGERY     BREAST BIOPSY Right    lymph node surgery, benign    CARDIAC CATHETERIZATION     [facility]   CARPAL TUNNEL RELEASE     CATARACT EXTRACTION W/ INTRAOCULAR LENS IMPLANT & ANTERIOR VITRECTOMY, BILATERAL     CHOLECYSTECTOMY     KNEE ARTHROPLASTY Right 01/30/2016   Procedure: COMPUTER ASSISTED TOTAL KNEE ARTHROPLASTY;  Surgeon: Dereck Leep, MD;  Location: [facility] ORS;  Service: Orthopedics;  Laterality: Right;   R/O Lymph Nodes; Right Axilla     ROTATOR CUFF REPAIR Right    SPG block  03/20/2016   sphenopalatine ganglion and maxillary division of trigeminal nerve block block Dr. Manuella Ghazi   TEMPORAL ARTERY BIOPSY / LIGATION     TONSILLECTOMY     TUBAL LIGATION      Prior to Admission medications   Medication Sig Start Date End Date Taking? Authorizing Provider  acetaminophen (TYLENOL) 500 MG tablet Take 500 mg by mouth in the morning and at bedtime.     [provider]  ALPRAZolam Duanne Moron) 0.25 MG tablet TAKE ONE TABLET BY MOUTH TWO TIMES A DAY AS NEEDED FOR ANXIETY 08/04/20   Mar Daring, PA-C  aspirin EC 81 MG tablet Take 81 mg by mouth daily.    [provider]  atorvastatin (LIPITOR) 20 MG tablet Take 1 tablet (20 mg total) by mouth daily. 01/11/21   Mar Daring, PA-C  cetirizine (ZYRTEC) 10 MG tablet Take 10 mg daily by mouth.    [provider]  Cholecalciferol (VITAMIN D3 PO) Take 1,000 Units by mouth.    [provider]  citalopram (CELEXA) 40 MG tablet TAKE 1 TABLET BY MOUTH EVERY DAY 04/11/21   Chrismon, Vickki Muff, PA-C  cyanocobalamin 1000 MCG tablet Take 1,000 mcg by mouth daily.    [provider]  Dentifrices (FLUORIDE TOOTHPASTE DT) Place onto teeth daily.     [provider]  EMGALITY, 300 MG DOSE, 100 MG/ML SOSY Inject into the skin. 01/22/21   [provider]  famotidine (PEPCID) 20 MG  tablet Take 1 tablet (20 mg total) by mouth daily. 01/11/21   Mar Daring, PA-C  fluticasone furoate-vilanterol (BREO ELLIPTA) 100-25 MCG/INH AEPB Inhale 1 puff into the lungs daily. 01/09/21   Mar Daring, PA-C  furosemide (LASIX) 80 MG tablet Take 1 tablet (80 mg total) by mouth daily. 05/16/21   Virginia Crews, MD  gabapentin (NEURONTIN) 100 MG capsule Start Gabapentin 200 mg (one tablet) in the morning, 200 mg in the afternoon (one tablet), and 300 mg in the evening. 01/09/21   Mar Daring, PA-C  gabapentin (NEURONTIN) 300 MG capsule Take 300 mg by mouth at bedtime. 04/23/21   [provider]  Galcanezumab-gnlm (EMGALITY [place]) Inject 300 mg as directed every 30 (thirty) days.  09/18/18   [provider]  levothyroxine (SYNTHROID) 25 MCG tablet Take 1 tablet (25 mcg total) by mouth daily before breakfast. 02/20/21   Burnette, Clearnce Sorrel, PA-C  MAGNESIUM GLYCINATE PO Take 500 mg by mouth daily.  [provider]  melatonin 5 MG TABS Take 5 mg by mouth at bedtime.     [provider]  Omega-3 Fatty Acids (FISH OIL) 1200 MG CAPS Take 2 capsules by mouth daily.     [provider]  omeprazole (PRILOSEC) 20 MG capsule TAKE 1 CAPSULE(20 MG) BY MOUTH DAILY 04/17/21   Bacigalupo, [name], MD  phenazopyridine (AZO-TABS) 95 MG tablet Take 1 tablet (95 mg total) by mouth as needed. 05/29/16   Mar Daring, PA-C  Polyethylene Glycol 3350 (MIRALAX PO) Take by mouth.    [provider]  potassium gluconate 595 (99 K) MG TABS tablet Take 1 tablet (595 mg total) by mouth 2 (two) times daily. 12/27/18   Mar Daring, PA-C  sodium chloride (OCEAN) 0.65 % nasal spray Place 1 spray into the nose as needed.     [provider]  traMADol (ULTRAM) 50 MG tablet TAKE 1 TABLET BY MOUTH TWICE DAILY AS NEEDED 04/17/21   Bacigalupo, [name], MD    Allergies Amoxicillin, Clindamycin/lincomycin, Doxycycline, Levaquin  [levofloxacin in d5w], Mirabegron, Nsaids, Oxycodone, Phenergan [promethazine hcl], Singulair [montelukast], Vicodin [hydrocodone-acetaminophen], Zocor [simvastatin], Zoloft  [sertraline], Ampicillin, Bactrim [sulfamethoxazole-trimethoprim], Penicillins, and Tolmetin  Family History  Problem Relation Age of Onset   Heart disease Mother    Depression Mother    Colon polyps Mother    Dementia Mother    Heart disease Father    Dementia Father    Heart disease Brother    Hypertension Brother    Heart disease Brother    Hypertension Brother    Kidney cancer Neg Hx    Bladder Cancer Neg Hx     Social History Social History   Tobacco Use   Smoking status: Never   Smokeless tobacco: Never  Vaping Use   Vaping Use: Never used  Substance Use Topics   Alcohol use: No   Drug use: No    Review of Systems  Review of Systems  Constitutional:  Negative for chills and fever.  HENT:  Positive for facial swelling. Negative for sore throat.   Respiratory:  Negative for shortness of breath.   Cardiovascular:  Negative for chest pain.  Gastrointestinal:  Negative for abdominal pain.  Genitourinary:  Negative for flank pain.  Musculoskeletal:  Negative for neck pain.  Skin:  Negative for rash and wound.  Allergic/Immunologic: Negative for immunocompromised state.  Neurological:  Positive for headaches. Negative for weakness and numbness.  Hematological:  Does not bruise/bleed easily.  All other systems reviewed and are negative.   ____________________________________________  PHYSICAL EXAM:      VITAL SIGNS: ED Triage Vitals  Enc Vitals Group     BP 06/27/21 0936 (!) 150/62     Pulse Rate 06/27/21 0936 74     Resp 06/27/21 0936 20     Temp 06/27/21 0936 98.3 F (36.8 C)     Temp Source 06/27/21 0936 Oral     SpO2 06/27/21 0936 95 %     Weight 06/27/21 0934 184 lb 1.4 oz (83.5 kg)     Height 06/27/21 0934 4\' 10"  (1.473 m)     Head Circumference --      Peak Flow --      Pain  Score 06/27/21 0934 8     Pain Loc --      Pain Edu? --      Excl. in [place]? --      Physical Exam Vitals and nursing note reviewed.  Constitutional:  General: She is not in acute distress.    Appearance: She is well-developed.  HENT:     Head: Normocephalic and atraumatic.     Comments: Superficial ecchymoses and small hematoma to the left frontal forehead.  No bony deformity appreciated.  No periorbital ecchymoses.  No other facial trauma. Eyes:     Conjunctiva/sclera: Conjunctivae normal.     Comments: Extraocular movements full and painless without evidence of entrapment.  Neck:     Comments: Mild upper cervical paraspinal tenderness, with no midline tenderness.  Range of motion is full and minimally painful. Cardiovascular:     Rate and Rhythm: Normal rate and regular rhythm.     Heart sounds: Normal heart sounds. No murmur heard.   No friction rub.  Pulmonary:     Effort: Pulmonary effort is normal. No respiratory distress.     Breath sounds: Normal breath sounds. No wheezing or rales.  Abdominal:     General: There is no distension.     Palpations: Abdomen is soft.     Tenderness: There is no abdominal tenderness.  Musculoskeletal:     Cervical back: Neck supple.  Skin:    General: Skin is warm.     Capillary Refill: Capillary refill takes less than 2 seconds.  Neurological:     Mental Status: She is alert and oriented to person, place, and time.     Motor: No abnormal muscle tone.      ____________________________________________   LABS (all labs ordered are listed, but only abnormal results are displayed)  Labs Reviewed - No data to display  ____________________________________________  EKG:  ________________________________________  RADIOLOGY All imaging, including plain films, CT scans, and ultrasounds, independently reviewed by me, and interpretations confirmed via formal radiology reads.  ED MD interpretation:   CT Head: No acute abnormality CT  C Spine: Nondisplaced fracture through anterior arch of C1 CXR: Clear X-ray hip bilateral: No fracture  Official radiology report(s): DG Chest 2 View  Result Date: 06/27/2021 CLINICAL DATA:  Fall EXAM: CHEST - 2 VIEW COMPARISON:  Chest radiograph 01/22/2021. FINDINGS: Unchanged, enlarged cardiac silhouette. There are mild right basilar opacities. No large pleural effusion or visible pneumothorax. Bilateral shoulder degenerative changes. Thoracic spondylosis with bridging osteophyte formation across multiple levels. There is possible discontinuity along anterior bridging osteophytes in the midthoracic spine, new from prior exam unchanged anterior vertebral body wedging above this level. IMPRESSION: Faint right basilar opacities which could be atelectasis or aspiration. Possible discontinuity along the anterior bridging osteophytes in the midthoracic spine, new from prior exam in March, which could indicate a midthoracic spine fracture. Consider CT or MRI for further evaluation. Unchanged anterior vertebral body wedging above this level. Electronically Signed   By: Maurine Simmering M.D.   On: 06/27/2021 11:48   CT HEAD WO CONTRAST (5MM)  Result Date: 06/27/2021 CLINICAL DATA:  Head trauma, minor EXAM: CT HEAD WITHOUT CONTRAST TECHNIQUE: Contiguous axial images were obtained from the base of the skull through the vertex without intravenous contrast. COMPARISON:  2015 FINDINGS: Brain: There is no acute intracranial hemorrhage, mass effect, or edema. Gray-white differentiation is preserved. There is no extra-axial fluid collection. Prominence of the ventricles and sulci reflects generalized parenchymal volume loss. Patchy low-attenuation in the supratentorial white matter is nonspecific may reflect mild to moderate chronic microvascular ischemic changes. Vascular: There is atherosclerotic calcification at the skull base. Skull: Calvarium is unremarkable. Sinuses/Orbits: No acute finding. Other: None. IMPRESSION: No  evidence of acute intracranial injury. These results were called  by telephone at the time of interpretation on 06/27/2021 at 12:02 pm to provider Ashok Cordia , who verbally acknowledged these results. Electronically Signed   By: Macy Mis M.D.   On: 06/27/2021 12:04   CT Cervical Spine Wo Contrast  Result Date: 06/27/2021 CLINICAL DATA:  Fall, hit head EXAM: CT CERVICAL SPINE WITHOUT CONTRAST TECHNIQUE: Multidetector CT imaging of the cervical spine was performed without intravenous contrast. Multiplanar CT image reconstructions were also generated. COMPARISON:  None. FINDINGS: Alignment: Trace anterolisthesis at C5-C6. Skull base and vertebrae: There is a fracture through the anterior arch of C1, new since 2014 neck CT. Margins appear sclerotic on some slices but irregular on others. Vertebral body heights are maintained. Soft tissues and spinal canal: No prevertebral fluid or swelling. No visible canal hematoma. Disc levels: Multilevel degenerative changes are present including disc space narrowing, endplate osteophytes, and facet and uncovertebral hypertrophy. Calcified disc protrusions/bulges are present from C4-C5 through C6-C7. There is no significant canal stenosis. Upper chest: Unremarkable. Other: Calcified plaque at the common carotid bifurcations. IMPRESSION: Nondisplaced fracture through the anterior arch of C1. Margins appear sclerotic on some slices but irregular in others. Therefore, may represent acute fracture. Consider MRI to evaluate for presence of corroborating marrow edema. Electronically Signed   By: Macy Mis M.D.   On: 06/27/2021 11:58    ____________________________________________  PROCEDURES   Procedure(s) performed (including Critical Care):  Procedures  ____________________________________________  INITIAL IMPRESSION / MDM / [place] / ED COURSE  As part of my medical decision making, I reviewed the following data within the electronic medical  record:  Nursing notes reviewed and incorporated, Old chart reviewed, Notes from prior ED visits, and [place] Controlled Substance [place] was evaluated in Emergency Department on 06/27/2021 for the symptoms described in the history of present illness. She was evaluated in the context of the global COVID-19 pandemic, which necessitated consideration that the patient might be at risk for infection with the SARS-CoV-2 virus that causes COVID-19. Institutional protocols and algorithms that pertain to the evaluation of patients at risk for COVID-19 are in a state of rapid change based on information released by regulatory bodies including the CDC and federal and state organizations. These policies and algorithms were followed during the patient's care in the ED.  Some ED evaluations and interventions may be delayed as a result of limited staffing during the pandemic.*     Medical Decision Making: 87 year old well-appearing female here with neck pain after mechanical fall approximately 24 hours ago.  Patient is neurovascularly intact.  She has no upper extremity weakness, numbness, paresthesias, or evidence to suggest central cord syndrome or occult cord injury.  Imaging reviewed, CT of the C-spine does show anterior C1 fracture.  She does have some tenderness here.  I discussed this with Dr. Cari Caraway.  Given clinical stability, he recommends cervical collar and outpatient follow-up.  Likely nonoperative.  She also had some mild hip pain so plain films were obtained and are unremarkable.  Her pain is improved here after taking her usual p.o. meds at home.  Discussed management of her collar, will discharge with outpatient neurosurgery follow-up.  ____________________________________________  FINAL CLINICAL IMPRESSION(S) / ED DIAGNOSES  Final diagnoses:  Closed nondisplaced fracture of first cervical vertebra, unspecified fracture morphology, initial encounter ([place])  Fall, initial  encounter     MEDICATIONS GIVEN DURING THIS VISIT:  Medications - No data to display   ED Discharge Orders  None        Note:  This document was prepared using Dragon voice recognition software and may include unintentional dictation errors.   Duffy Bruce, MD 06/27/21 1335

## 2021-06-27 NOTE — Discharge Instructions (Signed)
Keep your collar on at any time you are upright or walking  It is OK to take this off when bathing. Lie flat and do your best to minimize head movements, then quickly place it back on.  Follow-up with neurosurgery in the next week  Continue your usual Tramadol  Call your primary to discuss your Er visit and ideally, follow-up in the next week

## 2021-06-28 ENCOUNTER — Telehealth: Payer: Self-pay

## 2021-06-28 NOTE — Telephone Encounter (Signed)
Ready to pick up. Will leave at front desk. Reminder that it can take up to 7 days for form completion.

## 2021-06-28 NOTE — Telephone Encounter (Signed)
Copied from [place] [id number]. Topic: General - Other >> Jun 28, 2021 10:37 AM Leward Quan A wrote: Reason for CRM: Patient som Helen Ramsey called in asking if form dropped off yesterday for Handicapped placard have been signed by Dr B was told they would get it today. Please call when done Ph# [phone]

## 2021-06-28 NOTE — Telephone Encounter (Signed)
Patient advised.

## 2021-07-01 DIAGNOSIS — G8929 Other chronic pain: Secondary | ICD-10-CM | POA: Diagnosis not present

## 2021-07-01 DIAGNOSIS — E785 Hyperlipidemia, unspecified: Secondary | ICD-10-CM | POA: Diagnosis not present

## 2021-07-01 DIAGNOSIS — M48062 Spinal stenosis, lumbar region with neurogenic claudication: Secondary | ICD-10-CM | POA: Diagnosis not present

## 2021-07-01 DIAGNOSIS — G4730 Sleep apnea, unspecified: Secondary | ICD-10-CM | POA: Diagnosis not present

## 2021-07-01 DIAGNOSIS — M5137 Other intervertebral disc degeneration, lumbosacral region: Secondary | ICD-10-CM | POA: Diagnosis not present

## 2021-07-01 DIAGNOSIS — G2581 Restless legs syndrome: Secondary | ICD-10-CM | POA: Diagnosis not present

## 2021-07-08 DIAGNOSIS — G2581 Restless legs syndrome: Secondary | ICD-10-CM | POA: Diagnosis not present

## 2021-07-08 DIAGNOSIS — M5137 Other intervertebral disc degeneration, lumbosacral region: Secondary | ICD-10-CM | POA: Diagnosis not present

## 2021-07-08 DIAGNOSIS — G8929 Other chronic pain: Secondary | ICD-10-CM | POA: Diagnosis not present

## 2021-07-08 DIAGNOSIS — M48062 Spinal stenosis, lumbar region with neurogenic claudication: Secondary | ICD-10-CM | POA: Diagnosis not present

## 2021-07-08 DIAGNOSIS — E785 Hyperlipidemia, unspecified: Secondary | ICD-10-CM | POA: Diagnosis not present

## 2021-07-08 DIAGNOSIS — G4730 Sleep apnea, unspecified: Secondary | ICD-10-CM | POA: Diagnosis not present

## 2021-07-10 ENCOUNTER — Other Ambulatory Visit: Payer: Self-pay | Admitting: Physician Assistant

## 2021-07-10 DIAGNOSIS — E782 Mixed hyperlipidemia: Secondary | ICD-10-CM

## 2021-07-10 DIAGNOSIS — K219 Gastro-esophageal reflux disease without esophagitis: Secondary | ICD-10-CM

## 2021-07-10 DIAGNOSIS — R600 Localized edema: Secondary | ICD-10-CM

## 2021-07-11 DIAGNOSIS — M4692 Unspecified inflammatory spondylopathy, cervical region: Secondary | ICD-10-CM | POA: Diagnosis not present

## 2021-07-11 DIAGNOSIS — M4312 Spondylolisthesis, cervical region: Secondary | ICD-10-CM | POA: Diagnosis not present

## 2021-07-11 DIAGNOSIS — Z8781 Personal history of (healed) traumatic fracture: Secondary | ICD-10-CM | POA: Diagnosis not present

## 2021-07-11 DIAGNOSIS — M47812 Spondylosis without myelopathy or radiculopathy, cervical region: Secondary | ICD-10-CM | POA: Diagnosis not present

## 2021-07-11 DIAGNOSIS — I6523 Occlusion and stenosis of bilateral carotid arteries: Secondary | ICD-10-CM | POA: Diagnosis not present

## 2021-07-14 ENCOUNTER — Other Ambulatory Visit: Payer: Self-pay | Admitting: Physician Assistant

## 2021-07-14 DIAGNOSIS — R0602 Shortness of breath: Secondary | ICD-10-CM

## 2021-07-14 DIAGNOSIS — R059 Cough, unspecified: Secondary | ICD-10-CM

## 2021-07-16 DIAGNOSIS — G4730 Sleep apnea, unspecified: Secondary | ICD-10-CM | POA: Diagnosis not present

## 2021-07-16 DIAGNOSIS — E785 Hyperlipidemia, unspecified: Secondary | ICD-10-CM | POA: Diagnosis not present

## 2021-07-16 DIAGNOSIS — G2581 Restless legs syndrome: Secondary | ICD-10-CM | POA: Diagnosis not present

## 2021-07-16 DIAGNOSIS — M48062 Spinal stenosis, lumbar region with neurogenic claudication: Secondary | ICD-10-CM | POA: Diagnosis not present

## 2021-07-16 DIAGNOSIS — G8929 Other chronic pain: Secondary | ICD-10-CM | POA: Diagnosis not present

## 2021-07-16 DIAGNOSIS — M5137 Other intervertebral disc degeneration, lumbosacral region: Secondary | ICD-10-CM | POA: Diagnosis not present

## 2021-07-17 DIAGNOSIS — H16223 Keratoconjunctivitis sicca, not specified as Sjogren's, bilateral: Secondary | ICD-10-CM | POA: Diagnosis not present

## 2021-07-18 DIAGNOSIS — M48062 Spinal stenosis, lumbar region with neurogenic claudication: Secondary | ICD-10-CM | POA: Diagnosis not present

## 2021-07-18 DIAGNOSIS — G8929 Other chronic pain: Secondary | ICD-10-CM | POA: Diagnosis not present

## 2021-07-18 DIAGNOSIS — G4730 Sleep apnea, unspecified: Secondary | ICD-10-CM | POA: Diagnosis not present

## 2021-07-18 DIAGNOSIS — G2581 Restless legs syndrome: Secondary | ICD-10-CM | POA: Diagnosis not present

## 2021-07-18 DIAGNOSIS — M5137 Other intervertebral disc degeneration, lumbosacral region: Secondary | ICD-10-CM | POA: Diagnosis not present

## 2021-07-18 DIAGNOSIS — E785 Hyperlipidemia, unspecified: Secondary | ICD-10-CM | POA: Diagnosis not present

## 2021-07-20 DIAGNOSIS — M5137 Other intervertebral disc degeneration, lumbosacral region: Secondary | ICD-10-CM | POA: Diagnosis not present

## 2021-07-20 DIAGNOSIS — G4730 Sleep apnea, unspecified: Secondary | ICD-10-CM | POA: Diagnosis not present

## 2021-07-20 DIAGNOSIS — F32A Depression, unspecified: Secondary | ICD-10-CM | POA: Diagnosis not present

## 2021-07-20 DIAGNOSIS — K219 Gastro-esophageal reflux disease without esophagitis: Secondary | ICD-10-CM | POA: Diagnosis not present

## 2021-07-20 DIAGNOSIS — M48062 Spinal stenosis, lumbar region with neurogenic claudication: Secondary | ICD-10-CM | POA: Diagnosis not present

## 2021-07-20 DIAGNOSIS — Z86000 Personal history of in-situ neoplasm of breast: Secondary | ICD-10-CM | POA: Diagnosis not present

## 2021-07-20 DIAGNOSIS — G2581 Restless legs syndrome: Secondary | ICD-10-CM | POA: Diagnosis not present

## 2021-07-20 DIAGNOSIS — M1991 Primary osteoarthritis, unspecified site: Secondary | ICD-10-CM | POA: Diagnosis not present

## 2021-07-20 DIAGNOSIS — F419 Anxiety disorder, unspecified: Secondary | ICD-10-CM | POA: Diagnosis not present

## 2021-07-20 DIAGNOSIS — G629 Polyneuropathy, unspecified: Secondary | ICD-10-CM | POA: Diagnosis not present

## 2021-07-20 DIAGNOSIS — Z96651 Presence of right artificial knee joint: Secondary | ICD-10-CM | POA: Diagnosis not present

## 2021-07-20 DIAGNOSIS — K589 Irritable bowel syndrome without diarrhea: Secondary | ICD-10-CM | POA: Diagnosis not present

## 2021-07-20 DIAGNOSIS — Z9049 Acquired absence of other specified parts of digestive tract: Secondary | ICD-10-CM | POA: Diagnosis not present

## 2021-07-20 DIAGNOSIS — D649 Anemia, unspecified: Secondary | ICD-10-CM | POA: Diagnosis not present

## 2021-07-20 DIAGNOSIS — Z7951 Long term (current) use of inhaled steroids: Secondary | ICD-10-CM | POA: Diagnosis not present

## 2021-07-20 DIAGNOSIS — G8929 Other chronic pain: Secondary | ICD-10-CM | POA: Diagnosis not present

## 2021-07-20 DIAGNOSIS — I10 Essential (primary) hypertension: Secondary | ICD-10-CM | POA: Diagnosis not present

## 2021-07-20 DIAGNOSIS — E785 Hyperlipidemia, unspecified: Secondary | ICD-10-CM | POA: Diagnosis not present

## 2021-07-20 DIAGNOSIS — Z7982 Long term (current) use of aspirin: Secondary | ICD-10-CM | POA: Diagnosis not present

## 2021-07-20 DIAGNOSIS — Z9181 History of falling: Secondary | ICD-10-CM | POA: Diagnosis not present

## 2021-07-31 DIAGNOSIS — G4730 Sleep apnea, unspecified: Secondary | ICD-10-CM | POA: Diagnosis not present

## 2021-07-31 DIAGNOSIS — G8929 Other chronic pain: Secondary | ICD-10-CM | POA: Diagnosis not present

## 2021-07-31 DIAGNOSIS — G2581 Restless legs syndrome: Secondary | ICD-10-CM | POA: Diagnosis not present

## 2021-07-31 DIAGNOSIS — M5137 Other intervertebral disc degeneration, lumbosacral region: Secondary | ICD-10-CM | POA: Diagnosis not present

## 2021-07-31 DIAGNOSIS — E785 Hyperlipidemia, unspecified: Secondary | ICD-10-CM | POA: Diagnosis not present

## 2021-07-31 DIAGNOSIS — M48062 Spinal stenosis, lumbar region with neurogenic claudication: Secondary | ICD-10-CM | POA: Diagnosis not present

## 2021-08-08 DIAGNOSIS — S12000A Unspecified displaced fracture of first cervical vertebra, initial encounter for closed fracture: Secondary | ICD-10-CM | POA: Diagnosis not present

## 2021-08-08 DIAGNOSIS — M4312 Spondylolisthesis, cervical region: Secondary | ICD-10-CM | POA: Diagnosis not present

## 2021-08-08 DIAGNOSIS — M47812 Spondylosis without myelopathy or radiculopathy, cervical region: Secondary | ICD-10-CM | POA: Diagnosis not present

## 2021-08-08 DIAGNOSIS — Z8781 Personal history of (healed) traumatic fracture: Secondary | ICD-10-CM | POA: Diagnosis not present

## 2021-08-14 DIAGNOSIS — G4730 Sleep apnea, unspecified: Secondary | ICD-10-CM | POA: Diagnosis not present

## 2021-08-14 DIAGNOSIS — E785 Hyperlipidemia, unspecified: Secondary | ICD-10-CM | POA: Diagnosis not present

## 2021-08-14 DIAGNOSIS — G2581 Restless legs syndrome: Secondary | ICD-10-CM | POA: Diagnosis not present

## 2021-08-14 DIAGNOSIS — M5137 Other intervertebral disc degeneration, lumbosacral region: Secondary | ICD-10-CM | POA: Diagnosis not present

## 2021-08-14 DIAGNOSIS — G8929 Other chronic pain: Secondary | ICD-10-CM | POA: Diagnosis not present

## 2021-08-14 DIAGNOSIS — M48062 Spinal stenosis, lumbar region with neurogenic claudication: Secondary | ICD-10-CM | POA: Diagnosis not present

## 2021-08-15 ENCOUNTER — Other Ambulatory Visit: Payer: Self-pay

## 2021-08-15 ENCOUNTER — Encounter (HOSPITAL_COMMUNITY): Payer: Self-pay | Admitting: Radiology

## 2021-08-15 ENCOUNTER — Ambulatory Visit (INDEPENDENT_AMBULATORY_CARE_PROVIDER_SITE_OTHER): Payer: Medicare Other | Admitting: Family Medicine

## 2021-08-15 ENCOUNTER — Encounter: Payer: Self-pay | Admitting: Family Medicine

## 2021-08-15 VITALS — BP 128/48 | HR 74 | Temp 98.0°F | Resp 16 | Wt 177.0 lb

## 2021-08-15 DIAGNOSIS — J301 Allergic rhinitis due to pollen: Secondary | ICD-10-CM | POA: Diagnosis not present

## 2021-08-15 DIAGNOSIS — E782 Mixed hyperlipidemia: Secondary | ICD-10-CM | POA: Diagnosis not present

## 2021-08-15 DIAGNOSIS — D649 Anemia, unspecified: Secondary | ICD-10-CM | POA: Diagnosis not present

## 2021-08-15 DIAGNOSIS — K219 Gastro-esophageal reflux disease without esophagitis: Secondary | ICD-10-CM

## 2021-08-15 DIAGNOSIS — B372 Candidiasis of skin and nail: Secondary | ICD-10-CM

## 2021-08-15 DIAGNOSIS — I10 Essential (primary) hypertension: Secondary | ICD-10-CM

## 2021-08-15 DIAGNOSIS — I5032 Chronic diastolic (congestive) heart failure: Secondary | ICD-10-CM

## 2021-08-15 DIAGNOSIS — Z23 Encounter for immunization: Secondary | ICD-10-CM

## 2021-08-15 DIAGNOSIS — E039 Hypothyroidism, unspecified: Secondary | ICD-10-CM | POA: Diagnosis not present

## 2021-08-15 DIAGNOSIS — F3341 Major depressive disorder, recurrent, in partial remission: Secondary | ICD-10-CM

## 2021-08-15 DIAGNOSIS — G629 Polyneuropathy, unspecified: Secondary | ICD-10-CM | POA: Diagnosis not present

## 2021-08-15 DIAGNOSIS — F411 Generalized anxiety disorder: Secondary | ICD-10-CM

## 2021-08-15 MED ORDER — GABAPENTIN 300 MG PO CAPS: 300.0000 mg | ORAL_CAPSULE | Freq: Every evening | ORAL | 1 refills | Status: DC

## 2021-08-15 MED ORDER — KETOCONAZOLE 2 % EX CREA
1.0000 | TOPICAL_CREAM | Freq: Every day | CUTANEOUS | 1 refills | Status: DC
Start: 2021-08-15 — End: 2021-12-10

## 2021-08-15 MED ORDER — GABAPENTIN 100 MG PO CAPS: 200.0000 mg | ORAL_CAPSULE | Freq: Two times a day (BID) | ORAL | 1 refills | Status: DC

## 2021-08-15 MED ORDER — OMEPRAZOLE 20 MG PO CPDR
DELAYED_RELEASE_CAPSULE | ORAL | 1 refills | Status: DC
Start: 2021-08-15 — End: 2022-01-20

## 2021-08-15 MED ORDER — LEVOTHYROXINE SODIUM 25 MCG PO TABS: 25.0000 ug | ORAL_TABLET | Freq: Every day | ORAL | 1 refills | Status: DC

## 2021-08-15 MED ORDER — LISINOPRIL 5 MG PO TABS: 5.0000 mg | ORAL_TABLET | Freq: Every day | ORAL | 1 refills | Status: DC

## 2021-08-15 MED ORDER — FUROSEMIDE 80 MG PO TABS: 80.0000 mg | ORAL_TABLET | Freq: Every day | ORAL | 1 refills | Status: DC

## 2021-08-15 MED ORDER — CITALOPRAM HYDROBROMIDE 40 MG PO TABS: 40.0000 mg | ORAL_TABLET | Freq: Every day | ORAL | 1 refills | Status: DC

## 2021-08-15 NOTE — Assessment & Plan Note (Signed)
Well controlled, euvolemic conitnue lasix 80mg  daily Continue to monitor BMP Not currently on K supplement

## 2021-08-15 NOTE — Assessment & Plan Note (Signed)
Having more post nasal drip - causing voice changes, worse in the mornings Did not tolerate flonase due to septum perforation Continue ocean nasal spray

## 2021-08-15 NOTE — Assessment & Plan Note (Signed)
Well controlled Continue current medications Recheck metabolic panel F/u in 6 months  

## 2021-08-15 NOTE — Assessment & Plan Note (Signed)
Well controlled Continue celexa at current dose

## 2021-08-15 NOTE — Progress Notes (Signed)
Established patient visit   Patient: Helen Ramsey   DOB: 01-Dec-1932   87 y.o. Female  MRN: [id number] Visit Date: 08/15/2021  Today's healthcare provider: Shirlee Latch, MD   Chief Complaint  Patient presents with   Hypertension   Hyperlipidemia   Hypothyroidism   Subjective    Hypertension Associated symptoms include palpitations and shortness of breath. Pertinent negatives include no chest pain or headaches.  Hyperlipidemia Associated symptoms include shortness of breath. Pertinent negatives include no chest pain or myalgias.    Hypertension, follow-up  BP Readings from Last 3 Encounters:  08/15/21 (!) 128/48  06/27/21 (!) 150/62  05/16/21 (!) 120/39   Wt Readings from Last 3 Encounters:  08/15/21 177 lb (80.3 kg)  06/27/21 184 lb 1.4 oz (83.5 kg)  05/16/21 184 lb 1.6 oz (83.5 kg)     She was last seen for hypertension 3 months ago.  BP at that visit was 120/39. Management since that visit includes adjust lasix. The lasix helps to minimize fluid build-up do  She reports taking lisinopril 5 mg for a while and is in good tolerance. She reports excellent compliance with treatment. She is not having side effects.  She is following a Low fat diet. She is not exercising. She does not smoke.  Use of agents associated with hypertension: none.   Outside blood pressures are stable. Symptoms: No chest pain No chest pressure  Yes palpitations No syncope  Yes dyspnea No orthopnea  No paroxysmal nocturnal dyspnea Yes lower extremity edema   Pertinent labs: Lab Results  Component Value Date   CHOL 277 (H) 05/16/2021   HDL 31 (L) 05/16/2021   LDLCALC 182 (H) 05/16/2021   TRIG 323 (H) 05/16/2021   CHOLHDL 8.9 (H) 05/16/2021   Lab Results  Component Value Date   NA 132 (L) 05/16/2021   K 4.4 05/16/2021   CREATININE 0.97 05/16/2021   EGFR 57 (L) 05/16/2021   GFRNONAA 53 (L) 03/26/2021   GLUCOSE 96 05/16/2021     The ASCVD Risk score (Arnett DK, et  al., 2019) failed to calculate for the following reasons:   The 2019 ASCVD risk score is only valid for ages 60 to 60   The patient has a prior MI or stroke diagnosis   --------------------------------------------------------------------------------------------------- Lipid/Cholesterol, Follow-up  Last lipid panel Other pertinent labs  Lab Results  Component Value Date   CHOL 277 (H) 05/16/2021   HDL 31 (L) 05/16/2021   LDLCALC 182 (H) 05/16/2021   TRIG 323 (H) 05/16/2021   CHOLHDL 8.9 (H) 05/16/2021   Lab Results  Component Value Date   ALT 19 05/16/2021   AST 24 05/16/2021   PLT 272 05/16/2021   TSH 3.900 05/16/2021     She was last seen for this 3 months ago.  Management since that visit includes no changes.  She reports excellent compliance with treatment. She is not having side effects.   Symptoms: No chest pain Yes chest pressure/discomfort  Yes dyspnea Yes lower extremity edema  Yes numbness or tingling of extremity No orthopnea  Yes palpitations No paroxysmal nocturnal dyspnea  No speech difficulty No syncope   Current diet: in general, a "healthy" diet   Current exercise: none  The ASCVD Risk score (Arnett DK, et al., 2019) failed to calculate for the following reasons:   The 2019 ASCVD risk score is only valid for ages 16 to 48   The patient has a prior MI or stroke diagnosis  ---------------------------------------------------------------------------------------------------  Hypothyroid, follow-up  Lab Results  Component Value Date   TSH 3.900 05/16/2021   TSH 8.560 (H) 02/14/2021   TSH 6.870 (H) 01/22/2021   Wt Readings from Last 3 Encounters:  08/15/21 177 lb (80.3 kg)  06/27/21 184 lb 1.4 oz (83.5 kg)  05/16/21 184 lb 1.6 oz (83.5 kg)    She was last seen for hypothyroid 3 months ago.  Management since that visit includes no changes. She reports excellent compliance with treatment. She is not having side effects.   Symptoms: No change in  energy level Yes constipation  No diarrhea Yes heat / cold intolerance  No nervousness Yes palpitations  No weight changes     Skin breakdown/yeast rash She is having skin break down under her breast and folds accompanied by a rash that was most severe last week. It has resolved in most places at this time   Displace fracture of C1 She has a non displaced fracture of C1 from a fall in the bathroom. She sat on the bathroom floor for about an hour before she press her MedAlert button. She eventually went to the ED the following day because of the pain.   Speech Difficulties  She noticed a change in her voice and she has difficulties speaking. She will cough and drink water which helps to clear her throat. She reports she uses her nasal spray and has a humidifier in her room to help with her breathing. She stopped using Flonase because it caused issues for her. She has followed-up with this issue with ENT and there was no major findings related to sinus. She reports her vocal cords weren't examined.   Tremors  She has tremors all over but it is most severe in her hands. Her symptoms make it difficult to hold things in her hands and it bothers her when her hands stiffen. Her last visit with Dr. Sherryll Burger was 03/13/21. She is amenable to scheduling with Dr. Sherryll Burger if the tremors and stiffness worsen.  Hernia  When urinating she has an inconsistent stream. Occasionally she leaks on her self and there is some tenderness. She believes it may be associated wither her hernia.  -----------------------------------------------------------------------------------------     Medications: Outpatient Medications Prior to Visit  Medication Sig   acetaminophen (TYLENOL) 500 MG tablet Take 500 mg by mouth in the morning and at bedtime.    ALPRAZolam (XANAX) 0.25 MG tablet TAKE ONE TABLET BY MOUTH TWO TIMES A DAY AS NEEDED FOR ANXIETY   aspirin EC 81 MG tablet Take 81 mg by mouth daily.   atorvastatin (LIPITOR) 20  MG tablet TAKE 1 TABLET(20 MG) BY MOUTH DAILY   BREO ELLIPTA 100-25 MCG/INH AEPB INHALE 1 PUFF INTO THE LUNGS DAILY   cetirizine (ZYRTEC) 10 MG tablet Take 10 mg daily by mouth.   Cholecalciferol (VITAMIN D3 PO) Take 1,000 Units by mouth.   cyanocobalamin 1000 MCG tablet Take 1,000 mcg by mouth daily.   Dentifrices (FLUORIDE TOOTHPASTE DT) Place onto teeth daily.    famotidine (PEPCID) 20 MG tablet TAKE 1 TABLET(20 MG) BY MOUTH DAILY   Galcanezumab-gnlm (EMGALITY Kermit) Inject 300 mg as directed every 30 (thirty) days.    MAGNESIUM GLYCINATE PO Take 500 mg by mouth daily.   melatonin 5 MG TABS Take 5 mg by mouth at bedtime as needed.   Omega-3 Fatty Acids (FISH OIL) 1200 MG CAPS Take 2 capsules by mouth daily.    phenazopyridine (AZO-TABS) 95 MG tablet Take 1 tablet (95 mg total)  by mouth as needed. (Patient taking differently: Take 95 mg by mouth 2 (two) times daily.)   Polyethylene Glycol 3350 (MIRALAX PO) Take by mouth.   potassium gluconate 595 (99 K) MG TABS tablet Take 1 tablet (595 mg total) by mouth 2 (two) times daily.   sodium chloride (OCEAN) 0.65 % nasal spray Place 1 spray into the nose as needed.    traMADol (ULTRAM) 50 MG tablet TAKE 1 TABLET BY MOUTH TWICE DAILY AS NEEDED (Patient taking differently: Take 50 mg by mouth 3 (three) times daily. TAKE 1 TABLET BY MOUTH TWICE DAILY AS NEEDED)   [DISCONTINUED] citalopram (CELEXA) 40 MG tablet TAKE 1 TABLET BY MOUTH EVERY DAY   [DISCONTINUED] EMGALITY, 300 MG DOSE, 100 MG/ML SOSY Inject into the skin.   [DISCONTINUED] furosemide (LASIX) 80 MG tablet Take 1 tablet (80 mg total) by mouth daily.   [DISCONTINUED] gabapentin (NEURONTIN) 100 MG capsule Start Gabapentin 200 mg (one tablet) in the morning, 200 mg in the afternoon (one tablet), and 300 mg in the evening.   [DISCONTINUED] gabapentin (NEURONTIN) 300 MG capsule Take 300 mg by mouth at bedtime.   [DISCONTINUED] levothyroxine (SYNTHROID) 25 MCG tablet Take 1 tablet (25 mcg total) by  mouth daily before breakfast.   [DISCONTINUED] omeprazole (PRILOSEC) 20 MG capsule TAKE 1 CAPSULE(20 MG) BY MOUTH DAILY   No facility-administered medications prior to visit.    Review of Systems  Constitutional:  Positive for activity change. Negative for appetite change, chills, fatigue and fever.  HENT:  Negative for ear pain, sinus pressure, sinus pain and sore throat.   Eyes:  Negative for pain and visual disturbance.  Respiratory:  Positive for shortness of breath. Negative for cough, chest tightness and wheezing.   Cardiovascular:  Positive for palpitations and leg swelling. Negative for chest pain.  Gastrointestinal:  Positive for constipation. Negative for abdominal pain, diarrhea and vomiting.  Genitourinary:  Negative for dysuria, flank pain, frequency, pelvic pain and urgency.  Musculoskeletal:  Negative for back pain and myalgias.  Skin:  Positive for rash.  Neurological:  Positive for numbness. Negative for dizziness, light-headedness and headaches.       Objective    BP (!) 128/48 (BP Location: Left Arm, Patient Position: Sitting, Cuff Size: Large)   Pulse 74   Temp 98 F (36.7 C) (Oral)   Resp 16   Wt 177 lb (80.3 kg)   SpO2 94%   BMI 36.99 kg/m  BP Readings from Last 3 Encounters:  08/15/21 (!) 128/48  06/27/21 (!) 150/62  05/16/21 (!) 120/39   Wt Readings from Last 3 Encounters:  08/15/21 177 lb (80.3 kg)  06/27/21 184 lb 1.4 oz (83.5 kg)  05/16/21 184 lb 1.6 oz (83.5 kg)      Physical Exam Vitals reviewed.  Constitutional:      General: She is not in acute distress.    Appearance: Normal appearance. She is well-developed. She is not diaphoretic.  HENT:     Head: Normocephalic and atraumatic.  Eyes:     General: No scleral icterus.    Conjunctiva/sclera: Conjunctivae normal.  Neck:     Thyroid: No thyromegaly.  Cardiovascular:     Rate and Rhythm: Normal rate and regular rhythm.     Pulses: Normal pulses.     Heart sounds: Normal heart  sounds. No murmur heard. Pulmonary:     Effort: Pulmonary effort is normal. No respiratory distress.     Breath sounds: Normal breath sounds. No wheezing, rhonchi or rales.  Musculoskeletal:     Cervical back: Neck supple.     Right lower leg: No edema.     Left lower leg: No edema.  Lymphadenopathy:     Cervical: No cervical adenopathy.  Skin:    General: Skin is warm and dry.     Findings: Rash present.  Neurological:     Mental Status: She is alert and oriented to person, place, and time. Mental status is at baseline.     Motor: Tremor present.  Psychiatric:        Mood and Affect: Mood normal.        Behavior: Behavior normal.    C collar in place  No results found for any visits on 08/15/21.  Assessment & Plan     Problem List Items Addressed This Visit       Cardiovascular and Mediastinum   Essential (primary) hypertension - Primary    Well controlled Continue current medications Recheck metabolic panel F/u in 6 months       Relevant Medications   furosemide (LASIX) 80 MG tablet   lisinopril (ZESTRIL) 5 MG tablet   Other Relevant Orders   Comprehensive metabolic panel   (HFpEF) heart failure with preserved ejection fraction (HCC)    Well controlled, euvolemic conitnue lasix 80mg  daily Continue to monitor BMP Not currently on K supplement      Relevant Medications   furosemide (LASIX) 80 MG tablet   lisinopril (ZESTRIL) 5 MG tablet     Respiratory   Allergic rhinitis    Having more post nasal drip - causing voice changes, worse in the mornings Did not tolerate flonase due to septum perforation Continue ocean nasal spray         Endocrine   Acquired hypothyroidism    Previously well controlled Continue Synthroid at current dose  Recheck TSH and adjust Synthroid as indicated        Relevant Medications   levothyroxine (SYNTHROID) 25 MCG tablet   Other Relevant Orders   TSH     Other   Anemia, unspecified    Continue to monitor CBC       Relevant Orders   CBC w/Diff/Platelet   Mixed hyperlipidemia    Previouslt uncontrolled Recheck FLP and CMP Continue statin      Relevant Medications   furosemide (LASIX) 80 MG tablet   lisinopril (ZESTRIL) 5 MG tablet   Other Relevant Orders   Lipid panel   Comprehensive metabolic panel   Recurrent major depressive disorder, in partial remission (HCC)    Well controlled Continue celexa at current dose      Relevant Medications   citalopram (CELEXA) 40 MG tablet   Other Visit Diagnoses     Need for influenza vaccination       Relevant Orders   Flu Vaccine QUAD High Dose(Fluad) (Completed)   Neuropathy       Relevant Medications   gabapentin (NEURONTIN) 100 MG capsule   Gastroesophageal reflux disease without esophagitis       Relevant Medications   omeprazole (PRILOSEC) 20 MG capsule   GAD (generalized anxiety disorder)       Relevant Medications   citalopram (CELEXA) 40 MG tablet   Candidal intertrigo       Relevant Medications   ketoconazole (NIZORAL) 2 % cream        Return in about 3 months (around 11/14/2021) for chronic disease f/u.      I,Essence Turner,acting as a Neurosurgeon for Shirlee Latch, MD.,have documented all  relevant documentation on the behalf of Shirlee Latch, MD,as directed by  Shirlee Latch, MD while in the presence of Shirlee Latch, MD.  Total time spent on today's visit was greater than 40 minutes, including both face-to-face time and nonface-to-face time personally spent on review of chart (labs and imaging), discussing labs and goals, discussing further work-up, treatment options, referrals to specialist if needed, reviewing outside records of pertinent, answering patient's questions, and coordinating care.    I, Shirlee Latch, MD, have reviewed all documentation for this visit. The documentation on 08/15/21 for the exam, diagnosis, procedures, and orders are all accurate and complete.   Eneida Evers, Marzella Schlein, MD,  [facility]

## 2021-08-15 NOTE — Assessment & Plan Note (Signed)
Previouslt uncontrolled Recheck FLP and CMP Continue statin

## 2021-08-15 NOTE — Assessment & Plan Note (Signed)
Previously well controlled Continue Synthroid at current dose  Recheck TSH and adjust Synthroid as indicated   

## 2021-08-15 NOTE — Assessment & Plan Note (Signed)
Continue to monitor CBC. 

## 2021-08-16 ENCOUNTER — Telehealth: Payer: Self-pay

## 2021-08-16 DIAGNOSIS — G2581 Restless legs syndrome: Secondary | ICD-10-CM | POA: Diagnosis not present

## 2021-08-16 DIAGNOSIS — E785 Hyperlipidemia, unspecified: Secondary | ICD-10-CM | POA: Diagnosis not present

## 2021-08-16 DIAGNOSIS — M48062 Spinal stenosis, lumbar region with neurogenic claudication: Secondary | ICD-10-CM | POA: Diagnosis not present

## 2021-08-16 DIAGNOSIS — G8929 Other chronic pain: Secondary | ICD-10-CM | POA: Diagnosis not present

## 2021-08-16 DIAGNOSIS — M5137 Other intervertebral disc degeneration, lumbosacral region: Secondary | ICD-10-CM | POA: Diagnosis not present

## 2021-08-16 DIAGNOSIS — G4730 Sleep apnea, unspecified: Secondary | ICD-10-CM | POA: Diagnosis not present

## 2021-08-16 LAB — COMPREHENSIVE METABOLIC PANEL
ALT: 24 IU/L (ref 0–32)
AST: 33 IU/L (ref 0–40)
Albumin/Globulin Ratio: 1.1 — ABNORMAL LOW (ref 1.2–2.2)
Albumin: 3.9 g/dL (ref 3.6–4.6)
Alkaline Phosphatase: 70 IU/L (ref 44–121)
BUN/Creatinine Ratio: 20 (ref 12–28)
BUN: 26 mg/dL (ref 8–27)
Bilirubin Total: 0.3 mg/dL (ref 0.0–1.2)
CO2: 23 mmol/L (ref 20–29)
Calcium: 9.5 mg/dL (ref 8.7–10.3)
Chloride: 96 mmol/L (ref 96–106)
Creatinine, Ser: 1.27 mg/dL — ABNORMAL HIGH (ref 0.57–1.00)
Globulin, Total: 3.5 g/dL (ref 1.5–4.5)
Glucose: 104 mg/dL — ABNORMAL HIGH (ref 70–99)
Potassium: 3.9 mmol/L (ref 3.5–5.2)
Sodium: 133 mmol/L — ABNORMAL LOW (ref 134–144)
Total Protein: 7.4 g/dL (ref 6.0–8.5)
eGFR: 41 mL/min/{1.73_m2} — ABNORMAL LOW (ref >59)

## 2021-08-16 LAB — LIPID PANEL
Chol/HDL Ratio: 6.5 ratio — ABNORMAL HIGH (ref 0.0–4.4)
Cholesterol, Total: 182 mg/dL (ref 100–199)
HDL: 28 mg/dL — ABNORMAL LOW (ref >39)
LDL Chol Calc (NIH): 93 mg/dL (ref 0–99)
Triglycerides: 367 mg/dL — ABNORMAL HIGH (ref 0–149)
VLDL Cholesterol Cal: 61 mg/dL — ABNORMAL HIGH (ref 5–40)

## 2021-08-16 LAB — CBC WITH DIFFERENTIAL/PLATELET
Basophils Absolute: 0.1 10*3/uL (ref 0.0–0.2)
Basos: 1 %
EOS (ABSOLUTE): 1.3 10*3/uL — ABNORMAL HIGH (ref 0.0–0.4)
Eos: 19 %
Hematocrit: 32.2 % — ABNORMAL LOW (ref 34.0–46.6)
Hemoglobin: 10.9 g/dL — ABNORMAL LOW (ref 11.1–15.9)
Immature Grans (Abs): 0 10*3/uL (ref 0.0–0.1)
Immature Granulocytes: 1 %
Lymphocytes Absolute: 1.3 10*3/uL (ref 0.7–3.1)
Lymphs: 20 %
MCH: 29.5 pg (ref 26.6–33.0)
MCHC: 33.9 g/dL (ref 31.5–35.7)
MCV: 87 fL (ref 79–97)
Monocytes Absolute: 0.7 10*3/uL (ref 0.1–0.9)
Monocytes: 11 %
Neutrophils Absolute: 3.3 10*3/uL (ref 1.4–7.0)
Neutrophils: 48 %
Platelets: 313 10*3/uL (ref 150–450)
RBC: 3.69 x10E6/uL — ABNORMAL LOW (ref 3.77–5.28)
RDW: 13.8 % (ref 11.7–15.4)
WBC: 6.7 10*3/uL (ref 3.4–10.8)

## 2021-08-16 LAB — TSH: TSH: 6.61 u[IU]/mL — ABNORMAL HIGH (ref 0.450–4.500)

## 2021-08-16 MED ORDER — LEVOTHYROXINE SODIUM 50 MCG PO TABS: 50.0000 ug | ORAL_TABLET | Freq: Every day | ORAL | 1 refills | Status: DC

## 2021-08-16 NOTE — Telephone Encounter (Signed)
-----   Message from Virginia Crews, MD sent at 08/16/2021 12:23 PM EDT ----- Normal/stable labs.  Good improvement in cholesterol.  TSH is slightly elevated.  Is patient taking her Synthroid regularly?  If not, she needs to resume.  If she is, we should increase the dose to 50 mcg daily and recheck her TSH at her next visit in 3 months.

## 2021-08-19 DIAGNOSIS — K589 Irritable bowel syndrome without diarrhea: Secondary | ICD-10-CM | POA: Diagnosis not present

## 2021-08-19 DIAGNOSIS — Z9181 History of falling: Secondary | ICD-10-CM | POA: Diagnosis not present

## 2021-08-19 DIAGNOSIS — F419 Anxiety disorder, unspecified: Secondary | ICD-10-CM | POA: Diagnosis not present

## 2021-08-19 DIAGNOSIS — G4730 Sleep apnea, unspecified: Secondary | ICD-10-CM | POA: Diagnosis not present

## 2021-08-19 DIAGNOSIS — M48062 Spinal stenosis, lumbar region with neurogenic claudication: Secondary | ICD-10-CM | POA: Diagnosis not present

## 2021-08-19 DIAGNOSIS — Z9049 Acquired absence of other specified parts of digestive tract: Secondary | ICD-10-CM | POA: Diagnosis not present

## 2021-08-19 DIAGNOSIS — G629 Polyneuropathy, unspecified: Secondary | ICD-10-CM | POA: Diagnosis not present

## 2021-08-19 DIAGNOSIS — M5137 Other intervertebral disc degeneration, lumbosacral region: Secondary | ICD-10-CM | POA: Diagnosis not present

## 2021-08-19 DIAGNOSIS — S129XXD Fracture of neck, unspecified, subsequent encounter: Secondary | ICD-10-CM | POA: Diagnosis not present

## 2021-08-19 DIAGNOSIS — I10 Essential (primary) hypertension: Secondary | ICD-10-CM | POA: Diagnosis not present

## 2021-08-19 DIAGNOSIS — Z96651 Presence of right artificial knee joint: Secondary | ICD-10-CM | POA: Diagnosis not present

## 2021-08-19 DIAGNOSIS — G8929 Other chronic pain: Secondary | ICD-10-CM | POA: Diagnosis not present

## 2021-08-19 DIAGNOSIS — Z602 Problems related to living alone: Secondary | ICD-10-CM | POA: Diagnosis not present

## 2021-08-19 DIAGNOSIS — F32A Depression, unspecified: Secondary | ICD-10-CM | POA: Diagnosis not present

## 2021-08-19 DIAGNOSIS — E785 Hyperlipidemia, unspecified: Secondary | ICD-10-CM | POA: Diagnosis not present

## 2021-08-19 DIAGNOSIS — K219 Gastro-esophageal reflux disease without esophagitis: Secondary | ICD-10-CM | POA: Diagnosis not present

## 2021-08-19 DIAGNOSIS — M1991 Primary osteoarthritis, unspecified site: Secondary | ICD-10-CM | POA: Diagnosis not present

## 2021-08-19 DIAGNOSIS — Z7982 Long term (current) use of aspirin: Secondary | ICD-10-CM | POA: Diagnosis not present

## 2021-08-19 DIAGNOSIS — Z7951 Long term (current) use of inhaled steroids: Secondary | ICD-10-CM | POA: Diagnosis not present

## 2021-08-19 DIAGNOSIS — D649 Anemia, unspecified: Secondary | ICD-10-CM | POA: Diagnosis not present

## 2021-08-19 DIAGNOSIS — G2581 Restless legs syndrome: Secondary | ICD-10-CM | POA: Diagnosis not present

## 2021-08-19 DIAGNOSIS — Z86000 Personal history of in-situ neoplasm of breast: Secondary | ICD-10-CM | POA: Diagnosis not present

## 2021-08-20 ENCOUNTER — Other Ambulatory Visit (HOSPITAL_COMMUNITY): Payer: Self-pay | Admitting: Student

## 2021-08-20 DIAGNOSIS — E569 Vitamin deficiency, unspecified: Secondary | ICD-10-CM | POA: Diagnosis not present

## 2021-08-20 DIAGNOSIS — G3184 Mild cognitive impairment, so stated: Secondary | ICD-10-CM | POA: Diagnosis not present

## 2021-08-20 DIAGNOSIS — E559 Vitamin D deficiency, unspecified: Secondary | ICD-10-CM | POA: Diagnosis not present

## 2021-08-20 DIAGNOSIS — R251 Tremor, unspecified: Secondary | ICD-10-CM | POA: Diagnosis not present

## 2021-08-21 DIAGNOSIS — E785 Hyperlipidemia, unspecified: Secondary | ICD-10-CM | POA: Diagnosis not present

## 2021-08-21 DIAGNOSIS — G8929 Other chronic pain: Secondary | ICD-10-CM | POA: Diagnosis not present

## 2021-08-21 DIAGNOSIS — M5137 Other intervertebral disc degeneration, lumbosacral region: Secondary | ICD-10-CM | POA: Diagnosis not present

## 2021-08-21 DIAGNOSIS — G2581 Restless legs syndrome: Secondary | ICD-10-CM | POA: Diagnosis not present

## 2021-08-21 DIAGNOSIS — S129XXD Fracture of neck, unspecified, subsequent encounter: Secondary | ICD-10-CM | POA: Diagnosis not present

## 2021-08-21 DIAGNOSIS — M48062 Spinal stenosis, lumbar region with neurogenic claudication: Secondary | ICD-10-CM | POA: Diagnosis not present

## 2021-08-26 ENCOUNTER — Emergency Department: Payer: Medicare Other

## 2021-08-26 ENCOUNTER — Emergency Department
Admission: EM | Admit: 2021-08-26 | Discharge: 2021-08-26 | Disposition: A | Discharge status: Home / Self Care | Payer: Medicare Other | Attending: Emergency Medicine | Admitting: Emergency Medicine

## 2021-08-26 ENCOUNTER — Other Ambulatory Visit: Payer: Self-pay

## 2021-08-26 DIAGNOSIS — R0902 Hypoxemia: Secondary | ICD-10-CM | POA: Diagnosis not present

## 2021-08-26 DIAGNOSIS — M4316 Spondylolisthesis, lumbar region: Secondary | ICD-10-CM | POA: Diagnosis not present

## 2021-08-26 DIAGNOSIS — W01198A Fall on same level from slipping, tripping and stumbling with subsequent striking against other object, initial encounter: Secondary | ICD-10-CM | POA: Diagnosis not present

## 2021-08-26 DIAGNOSIS — R519 Headache, unspecified: Secondary | ICD-10-CM | POA: Diagnosis not present

## 2021-08-26 DIAGNOSIS — I2510 Atherosclerotic heart disease of native coronary artery without angina pectoris: Secondary | ICD-10-CM | POA: Diagnosis not present

## 2021-08-26 DIAGNOSIS — Z96651 Presence of right artificial knee joint: Secondary | ICD-10-CM | POA: Diagnosis not present

## 2021-08-26 DIAGNOSIS — S50312A Abrasion of left elbow, initial encounter: Secondary | ICD-10-CM | POA: Diagnosis not present

## 2021-08-26 DIAGNOSIS — I959 Hypotension, unspecified: Secondary | ICD-10-CM | POA: Diagnosis not present

## 2021-08-26 DIAGNOSIS — I509 Heart failure, unspecified: Secondary | ICD-10-CM | POA: Diagnosis not present

## 2021-08-26 DIAGNOSIS — Z23 Encounter for immunization: Secondary | ICD-10-CM | POA: Diagnosis not present

## 2021-08-26 DIAGNOSIS — M47816 Spondylosis without myelopathy or radiculopathy, lumbar region: Secondary | ICD-10-CM | POA: Diagnosis not present

## 2021-08-26 DIAGNOSIS — W19XXXA Unspecified fall, initial encounter: Secondary | ICD-10-CM

## 2021-08-26 DIAGNOSIS — N281 Cyst of kidney, acquired: Secondary | ICD-10-CM | POA: Diagnosis not present

## 2021-08-26 DIAGNOSIS — Z7982 Long term (current) use of aspirin: Secondary | ICD-10-CM | POA: Insufficient documentation

## 2021-08-26 DIAGNOSIS — M50221 Other cervical disc displacement at C4-C5 level: Secondary | ICD-10-CM | POA: Diagnosis not present

## 2021-08-26 DIAGNOSIS — Z043 Encounter for examination and observation following other accident: Secondary | ICD-10-CM | POA: Diagnosis not present

## 2021-08-26 DIAGNOSIS — I110 Hypertensive heart disease with heart failure: Secondary | ICD-10-CM | POA: Diagnosis not present

## 2021-08-26 DIAGNOSIS — S0990XA Unspecified injury of head, initial encounter: Secondary | ICD-10-CM | POA: Diagnosis not present

## 2021-08-26 DIAGNOSIS — Z79899 Other long term (current) drug therapy: Secondary | ICD-10-CM | POA: Insufficient documentation

## 2021-08-26 DIAGNOSIS — S12001D Unspecified nondisplaced fracture of first cervical vertebra, subsequent encounter for fracture with routine healing: Secondary | ICD-10-CM

## 2021-08-26 DIAGNOSIS — K5730 Diverticulosis of large intestine without perforation or abscess without bleeding: Secondary | ICD-10-CM | POA: Insufficient documentation

## 2021-08-26 DIAGNOSIS — M25522 Pain in left elbow: Secondary | ICD-10-CM | POA: Diagnosis not present

## 2021-08-26 DIAGNOSIS — S199XXA Unspecified injury of neck, initial encounter: Secondary | ICD-10-CM | POA: Diagnosis present

## 2021-08-26 DIAGNOSIS — M4312 Spondylolisthesis, cervical region: Secondary | ICD-10-CM | POA: Diagnosis not present

## 2021-08-26 DIAGNOSIS — K760 Fatty (change of) liver, not elsewhere classified: Secondary | ICD-10-CM | POA: Diagnosis not present

## 2021-08-26 DIAGNOSIS — M50223 Other cervical disc displacement at C6-C7 level: Secondary | ICD-10-CM | POA: Diagnosis not present

## 2021-08-26 DIAGNOSIS — G319 Degenerative disease of nervous system, unspecified: Secondary | ICD-10-CM | POA: Diagnosis not present

## 2021-08-26 DIAGNOSIS — S12000D Unspecified displaced fracture of first cervical vertebra, subsequent encounter for fracture with routine healing: Secondary | ICD-10-CM | POA: Diagnosis not present

## 2021-08-26 DIAGNOSIS — S12000A Unspecified displaced fracture of first cervical vertebra, initial encounter for closed fracture: Secondary | ICD-10-CM | POA: Diagnosis not present

## 2021-08-26 DIAGNOSIS — K429 Umbilical hernia without obstruction or gangrene: Secondary | ICD-10-CM | POA: Diagnosis not present

## 2021-08-26 DIAGNOSIS — M545 Low back pain, unspecified: Secondary | ICD-10-CM | POA: Diagnosis not present

## 2021-08-26 DIAGNOSIS — M533 Sacrococcygeal disorders, not elsewhere classified: Secondary | ICD-10-CM | POA: Diagnosis not present

## 2021-08-26 DIAGNOSIS — I739 Peripheral vascular disease, unspecified: Secondary | ICD-10-CM | POA: Diagnosis not present

## 2021-08-26 DIAGNOSIS — D734 Cyst of spleen: Secondary | ICD-10-CM | POA: Diagnosis not present

## 2021-08-26 DIAGNOSIS — M7989 Other specified soft tissue disorders: Secondary | ICD-10-CM | POA: Diagnosis not present

## 2021-08-26 MED ORDER — ACETAMINOPHEN 500 MG PO TABS
1000.0000 mg | ORAL_TABLET | Freq: Once | ORAL | Status: AC
  Administered 2021-08-26: 1000 mg via ORAL
  Filled 2021-08-26: qty 2

## 2021-08-26 MED ORDER — TETANUS-DIPHTH-ACELL PERTUSSIS 5-2.5-18.5 LF-MCG/0.5 IM SUSY
0.5000 mL | PREFILLED_SYRINGE | Freq: Once | INTRAMUSCULAR | Status: AC
  Administered 2021-08-26: 0.5 mL via INTRAMUSCULAR
  Filled 2021-08-26: qty 0.5

## 2021-08-26 MED ORDER — TRAMADOL HCL 50 MG PO TABS
50.0000 mg | ORAL_TABLET | Freq: Once | ORAL | Status: AC
  Administered 2021-08-26: 50 mg via ORAL
  Filled 2021-08-26: qty 1

## 2021-08-26 NOTE — ED Provider Notes (Signed)
[facility] Emergency Department Provider Note  ____________________________________________   Event Date/Time   First MD Initiated Contact with Patient 08/26/21 1214     (approximate)  I have reviewed the triage vital signs and the nursing notes.   HISTORY  Chief Complaint Fall   HPI Helen Ramsey is a 87 y.o. female with a past medical history of lumbar degenerative disc disease, aortic atherosclerosis chronic arthritic pain and most of her extremities and joints as well as CHF and recent fall on 8/11 having sustained a C1 fracture currently followed by neurosurgery outpatient with hard collar on who presents for assessment after mechanical ground-level fall today.  Patient states she was using her walker and bent over to get resting off the ground when she lost her balance and initially was falling forward but then fell backward.  She states that he hit her left head and fell onto her bottom.  She thinks she may have some acute on chronic low back pain.  She also states she scraped her left elbow.  She does not think she has an acute on chronic pain elsewhere in her extremities.  No recent fevers, chills, cough, nausea, vomiting, diarrhea, burning with urination, chest pain or other interim falls.  She states she is on aspirin but no other anticoagulation.  No other acute concerns at this time.         Past Medical History:  Diagnosis Date   Allergic rhinitis, cause unspecified    Allergic rhinitis, cause unspecified    Atherosclerosis of aorta (HCC)    Back pain    lumbar   Degeneration of lumbar or lumbosacral intervertebral disc    Diverticulitis of colon (without mention of hemorrhage)(562.11)    Dizziness and giddiness    Dysthymic disorder    Headache(784.0)    Myalgia and myositis, unspecified    Osteoporosis, unspecified    Other and unspecified hyperlipidemia    Other diseases of nasal cavity and sinuses(478.19)    Other specified  disorders of bladder    Syncope    Trigger finger (acquired)    Unspecified essential hypertension    Unspecified sleep apnea    Urinary frequency     Patient Active Problem List   Diagnosis Date Noted   Acquired hypothyroidism 05/17/2021   (HFpEF) heart failure with preserved ejection fraction ([place]) 05/16/2021   History of dyspnea 01/26/2021   Recurrent major depressive disorder, in partial remission ([place]) 02/27/2020   Bilateral myofascial pain 06/01/2019   Bilateral occipital neuralgia 06/01/2019   Age-related osteoporosis without current pathological fracture 08/16/2018   Gait instability 08/16/2018   Dorsalgia 03/31/2016   H/O total knee replacement 02/14/2016   Vitamin D deficiency 11/29/2015   Allergy to other foods 11/29/2015   Knee pain, bilateral 08/29/2015   Sleep apnea 05/30/2015   Mixed hyperlipidemia 05/09/2015   Restless legs syndrome 05/09/2015   Allergic rhinitis 03/28/2015   Anemia, unspecified 03/28/2015   Chronic headache 03/28/2015   DDD (degenerative disc disease), lumbar 03/28/2015   Gastro-esophageal reflux disease without esophagitis 03/28/2015   Essential (primary) hypertension 03/28/2015   Adaptive colitis 03/28/2015   OP (osteoporosis) 03/28/2015   Nasal septal perforation 03/28/2015   Spinal stenosis 03/28/2015   Gastric ulcer without hemorrhage or perforation 03/28/2015   Cerebral artery occlusion with cerebral infarction ([place]) 03/28/2015   Connective tissue and disc stenosis of intervertebral foramina of abdomen and other regions 03/28/2015   Diverticulosis of large intestine without perforation or abscess without bleeding 03/28/2015  Osteoarthritis 03/28/2015   Other specified functional intestinal disorders 03/28/2015   Hardening of the aorta (main artery of the heart) ([place]) 07/13/2014   H/O adenomatous polyp of colon 06/27/2005    Past Surgical History:  Procedure Laterality Date   ABDOMINAL HYSTERECTOMY     ADENOIDECTOMY      APPENDECTOMY     ARTHROSCOPIC KNEE SURGERY     BREAST BIOPSY Right    lymph node surgery, benign    CARDIAC CATHETERIZATION     [facility]   CARPAL TUNNEL RELEASE     CATARACT EXTRACTION W/ INTRAOCULAR LENS IMPLANT & ANTERIOR VITRECTOMY, BILATERAL     CHOLECYSTECTOMY     KNEE ARTHROPLASTY Right 01/30/2016   Procedure: COMPUTER ASSISTED TOTAL KNEE ARTHROPLASTY;  Surgeon: Dereck Leep, MD;  Location: [facility] ORS;  Service: Orthopedics;  Laterality: Right;   R/O Lymph Nodes; Right Axilla     ROTATOR CUFF REPAIR Right    SPG block  03/20/2016   sphenopalatine ganglion and maxillary division of trigeminal nerve block block Dr. Manuella Ghazi   TEMPORAL ARTERY BIOPSY / LIGATION     TONSILLECTOMY     TUBAL LIGATION      Prior to Admission medications   Medication Sig Start Date End Date Taking? Authorizing Provider  acetaminophen (TYLENOL) 500 MG tablet Take 500 mg by mouth in the morning and at bedtime.     [provider]  ALPRAZolam Duanne Moron) 0.25 MG tablet TAKE ONE TABLET BY MOUTH TWO TIMES A DAY AS NEEDED FOR ANXIETY 08/04/20   Mar Daring, PA-C  aspirin EC 81 MG tablet Take 81 mg by mouth daily.    [provider]  atorvastatin (LIPITOR) 20 MG tablet TAKE 1 TABLET(20 MG) BY MOUTH DAILY 07/12/21   Bacigalupo, [name], MD  BREO ELLIPTA 100-25 MCG/INH AEPB INHALE 1 PUFF INTO THE LUNGS DAILY 07/15/21   Virginia Crews, MD  cetirizine (ZYRTEC) 10 MG tablet Take 10 mg daily by mouth.    [provider]  Cholecalciferol (VITAMIN D3 PO) Take 1,000 Units by mouth.    [provider]  citalopram (CELEXA) 40 MG tablet Take 1 tablet (40 mg total) by mouth daily. 08/15/21   Virginia Crews, MD  cyanocobalamin 1000 MCG tablet Take 1,000 mcg by mouth daily.    [provider]  Dentifrices (FLUORIDE TOOTHPASTE DT) Place onto teeth daily.     [provider]  famotidine (PEPCID) 20 MG tablet TAKE 1 TABLET(20 MG) BY MOUTH DAILY 07/12/21   Bacigalupo, [name], MD  furosemide (LASIX) 80 MG tablet Take 1 tablet (80 mg total) by mouth daily. 08/15/21   Virginia Crews, MD  gabapentin (NEURONTIN) 100 MG capsule Take 2 capsules (200 mg total) by mouth 2 (two) times daily. Start Gabapentin 200 mg (one tablet) in the morning, 200 mg in the afternoon (one tablet), and 300 mg in the evening. 08/15/21   Bacigalupo, [name], MD  gabapentin (NEURONTIN) 300 MG capsule Take 1 capsule (300 mg total) by mouth at bedtime. 08/15/21   Bacigalupo, [name], MD  Galcanezumab-gnlm (EMGALITY [place]) Inject 300 mg as directed every 30 (thirty) days.  09/18/18   [provider]  ketoconazole (NIZORAL) 2 % cream Apply 1 application topically daily. 08/15/21   Virginia Crews, MD  levothyroxine (SYNTHROID) 50 MCG tablet Take 1 tablet (50 mcg total) by mouth daily before breakfast. 08/16/21   Bacigalupo, [name], MD  lisinopril (ZESTRIL) 5 MG tablet Take 1 tablet (5 mg  total) by mouth daily. 08/15/21   Virginia Crews, MD  MAGNESIUM GLYCINATE PO Take 500 mg by mouth daily.    [provider]  melatonin 5 MG TABS Take 5 mg by mouth at bedtime as needed.    [provider]  Omega-3 Fatty Acids (FISH OIL) 1200 MG CAPS Take 2 capsules by mouth daily.     [provider]  omeprazole (PRILOSEC) 20 MG capsule TAKE 1 CAPSULE(20 MG) BY MOUTH DAILY 08/15/21   Virginia Crews, MD  phenazopyridine (AZO-TABS) 95 MG tablet Take 1 tablet (95 mg total) by mouth as needed. Patient taking differently: Take 95 mg by mouth 2 (two) times daily. 05/29/16   Mar Daring, PA-C  Polyethylene Glycol 3350 (MIRALAX PO) Take by mouth.    [provider]  potassium gluconate 595 (99 K) MG TABS tablet Take 1 tablet (595 mg total) by mouth 2 (two) times daily. 12/27/18   Mar Daring, PA-C  sodium chloride (OCEAN) 0.65 % nasal spray Place 1 spray into the nose as needed.     [provider]  traMADol (ULTRAM) 50 MG tablet TAKE 1  TABLET BY MOUTH TWICE DAILY AS NEEDED Patient taking differently: Take 50 mg by mouth 3 (three) times daily. TAKE 1 TABLET BY MOUTH TWICE DAILY AS NEEDED 04/17/21   Bacigalupo, [name], MD    Allergies Amoxicillin, Clindamycin/lincomycin, Doxycycline, Levaquin [levofloxacin in d5w], Mirabegron, Nsaids, Oxycodone, Phenergan [promethazine hcl], Singulair [montelukast], Vicodin [hydrocodone-acetaminophen], Zocor [simvastatin], Zoloft  [sertraline], Ampicillin, Bactrim [sulfamethoxazole-trimethoprim], Penicillins, and Tolmetin  Family History  Problem Relation Age of Onset   Heart disease Mother    Depression Mother    Colon polyps Mother    Dementia Mother    Heart disease Father    Dementia Father    Heart disease Brother    Hypertension Brother    Heart disease Brother    Hypertension Brother    Kidney cancer Neg Hx    Bladder Cancer Neg Hx     Social History Social History   Tobacco Use   Smoking status: Never   Smokeless tobacco: Never  Vaping Use   Vaping Use: Never used  Substance Use Topics   Alcohol use: No   Drug use: No    Review of Systems  Review of Systems  Constitutional:  Negative for chills and fever.  HENT:  Negative for sore throat.   Eyes:  Negative for pain.  Respiratory:  Negative for cough and stridor.   Cardiovascular:  Negative for chest pain.  Gastrointestinal:  Negative for vomiting.  Genitourinary:  Negative for dysuria.  Musculoskeletal:  Positive for falls, joint pain ("everywhere" but more in the L elbow and posterior /bl hips) and neck pain.  Skin:  Negative for rash.  Neurological:  Positive for headaches. Negative for seizures and loss of consciousness.  Psychiatric/Behavioral:  Negative for suicidal ideas.   All other systems reviewed and are negative.    ____________________________________________   PHYSICAL EXAM:  VITAL SIGNS: ED Triage Vitals  Enc Vitals Group     BP 08/26/21 1210 (!) 129/103     Pulse Rate 08/26/21 1210  71     Resp 08/26/21 1210 16     Temp 08/26/21 1210 98.7 F (37.1 C)     Temp Source 08/26/21 1210 Oral     SpO2 08/26/21 1210 97 %     Weight 08/26/21 1211 175 lb (79.4 kg)     Height 08/26/21 1211 5' (1.524 m)  Head Circumference --      Peak Flow --      Pain Score 08/26/21 1205 5     Pain Loc --      Pain Edu? --      Excl. in [place]? --    Vitals:   08/26/21 1210 08/26/21 1349  BP: (!) 129/103 120/89  Pulse: 71 70  Resp: 16 16  Temp: 98.7 F (37.1 C)   SpO2: 97% 97%   Physical Exam Vitals and nursing note reviewed.  Constitutional:      General: She is not in acute distress.    Appearance: She is well-developed.  HENT:     Head: Normocephalic and atraumatic.     Right Ear: External ear normal.     Left Ear: External ear normal.     Nose: Nose normal.     Mouth/Throat:     Mouth: Mucous membranes are moist.  Eyes:     Conjunctiva/sclera: Conjunctivae normal.  Cardiovascular:     Rate and Rhythm: Normal rate and regular rhythm.     Heart sounds: No murmur heard. Pulmonary:     Effort: Pulmonary effort is normal. No respiratory distress.     Breath sounds: Normal breath sounds.  Abdominal:     Palpations: Abdomen is soft.     Tenderness: There is no abdominal tenderness.  Musculoskeletal:     Cervical back: Neck supple.  Skin:    General: Skin is warm and dry.     Capillary Refill: Capillary refill takes less than 2 seconds.  Neurological:     Mental Status: She is alert and oriented to person, place, and time.  Psychiatric:        Mood and Affect: Mood normal.    There is noted to be or step-offs over the C-spine.  Significant palpation deferred given known C1 injury.  No significant tenderness over the upper T-spine but there is some mild tenderness over the lower T-spine and over the L-spine.  Cranial nerves II through XII are grossly intact.  There is a little bit of tenderness over the left forehead but no significant hematoma fluctuance or fracture.   No other obvious trauma to the face or scalp.  2+ radial and DP pulses.  Patient has full strength and range of motion throughout the bilateral hips, ankles and knees.  She has some tenderness over the posterior hips bilaterally.   There is a skin tear over the left forearm proximally just distal to the elbow.  She has full range of motion and strength at the elbow as well to the bilateral shoulders and wrists.  Sensation is intact in the distribution of the radial ulnar and median nerves in bilateral upper extremities.. ____________________________________________   LABS (all labs ordered are listed, but only abnormal results are displayed)  Labs Reviewed - No data to display ____________________________________________  EKG  ____________________________________________  RADIOLOGY  ED MD interpretation: CT head and C-spine showed no evidence of skull fracture, intracranial hemorrhage and stable appearance of previously noted C1 fracture without any acute fracture seen.  There is some degenerative changes noted.  Plain film of the T and L-spine showed no acute thoracic or lumbar fractures.  Plain film of the left elbow shows no fracture dislocation but some soft tissue swelling.  Plain film of the pelvis shows no acute fracture dislocation but on pelvis and lumbar plain film there is a rounded hyperdense lesion over the left sacrum of unclear significance.  CT abdomen pelvis shows  no other acute injury and previously seen rounded lesion not clearly identified likely reflecting some stool.  Official radiology report(s): CT ABDOMEN PELVIS WO CONTRAST  Result Date: 08/26/2021 CLINICAL DATA:  Abnormality on lumbar radiograph EXAM: CT ABDOMEN AND PELVIS WITHOUT CONTRAST TECHNIQUE: Multidetector CT imaging of the abdomen and pelvis was performed following the standard protocol without IV contrast. COMPARISON:  Correlation made with lumbar spine radiograph earlier same day FINDINGS: Lower chest: No  acute abnormality. Hepatobiliary: Hepatic steatosis. Cholecystectomy. No unexpected biliary dilatation. Pancreas: Unremarkable. Spleen: Area of peripheral calcification within the spleen likely about a cyst was also present in 2012. Additional more ill-defined cystic area is present posteriorly and was also there on the prior study. Adrenals/Urinary Tract: Adrenals are unremarkable. Renal cortical scarring. Small exophytic cyst of the left kidney. Bladder is unremarkable. Stomach/Bowel: Stomach is unremarkable apart from likely a small lipoma within the wall of the antrum. Cells present on the 2012 study. Bowel is normal in caliber. Vascular/Lymphatic: Aortic atherosclerosis. No enlarged lymph nodes. Reproductive: Status post hysterectomy. No adnexal masses. Other: No free fluid. Mild body wall edema. Small fat containing paraumbilical hernia. Musculoskeletal: Degenerative changes of the included spine. There is changes anatomy at the lumbosacral junction. No acute osseous abnormality. IMPRESSION: There is no finding identified corresponding to possible abnormality on lumbar radiograph, which may reflect stool. Chronic/nonemergent findings detailed above. Electronically Signed   By: Macy Mis M.D.   On: 08/26/2021 14:50   DG Thoracic Spine 2 View  Result Date: 08/26/2021 CLINICAL DATA:  Fall EXAM: THORACIC SPINE 2 VIEWS COMPARISON:  Two-view chest radiograph 06/27/2021 FINDINGS: Unchanged chronic midthoracic vertebral body anterior wedging. There is no new compression deformity. Unchanged bridging osteophyte formation anteriorly. Moderate multilevel degenerative changes of the thoracic spine. IMPRESSION: Unchanged chronic anterior wedging in the midthoracic spine. No new compression deformity. Moderate multilevel degenerative changes. Electronically Signed   By: Maurine Simmering M.D.   On: 08/26/2021 13:31   DG Lumbar Spine Complete  Result Date: 08/26/2021 CLINICAL DATA:  Fall EXAM: LUMBAR SPINE -  COMPLETE 4+ VIEW COMPARISON:  X-ray dated March 02, 2014 FINDINGS: No definite evidence of acute fracture. Mild dextrocurvature of the lumbar spine with a rotatory component. Grade 1 anterolisthesis of L4 on L5, unchanged compared to prior. Mild multilevel degenerative disc disease. Moderate facet arthropathy. Degenerative changes of the left SI joint and pubic symphysis, unchanged compared to prior. Unchanged calcified structure of the left upper quadrant which correlates with a calcified splenic cyst. New indeterminate rounded hyperdense structure overlying the left sacrum, possibly hyperdense bowel contents. IMPRESSION: No evidence of acute fracture. Unchanged lumbar spine spondylosis. New indeterminate rounded hyperdense structure overlying the left sacrum, possibly intraluminal material. Recommend follow-up abdominal radiograph for further evaluation. Electronically Signed   By: Yetta Glassman M.D.   On: 08/26/2021 13:35   DG Elbow Complete Left  Result Date: 08/26/2021 CLINICAL DATA:  Fall, left elbow pain EXAM: LEFT ELBOW - COMPLETE 3+ VIEW COMPARISON:  None. FINDINGS: There is no evidence of fracture, dislocation, or joint effusion. There is no evidence of arthropathy or other focal bone abnormality. Soft tissue swelling about the elbow and upper arm. IMPRESSION: No fracture or dislocation of the left elbow. Soft tissue swelling about the elbow and upper arm. Electronically Signed   By: Davina Poke D.O.   On: 08/26/2021 13:25   CT HEAD WO CONTRAST (5MM)  Result Date: 08/26/2021 CLINICAL DATA:  Fall EXAM: CT HEAD WITHOUT CONTRAST TECHNIQUE: Contiguous axial images were obtained from the  base of the skull through the vertex without intravenous contrast. COMPARISON:  06/27/2021 FINDINGS: Brain: There is atrophy and chronic small vessel disease changes. No acute intracranial abnormality. Specifically, no hemorrhage, hydrocephalus, mass lesion, acute infarction, or significant intracranial  injury. Vascular: No hyperdense vessel or unexpected calcification. Skull: No acute calvarial abnormality. Sinuses/Orbits: No acute findings Other: None IMPRESSION: Atrophy, chronic microvascular disease. No acute intracranial abnormality. Electronically Signed   By: Rolm Baptise M.D.   On: 08/26/2021 13:19   CT Cervical Spine Wo Contrast  Result Date: 08/26/2021 CLINICAL DATA:  Neck trauma (Age >= 65y).  Unwitnessed fall EXAM: CT CERVICAL SPINE WITHOUT CONTRAST TECHNIQUE: Multidetector CT imaging of the cervical spine was performed without intravenous contrast. Multiplanar CT image reconstructions were also generated. COMPARISON:  06/27/2021 FINDINGS: Alignment: Trace anterolisthesis at C4-5 and C5-6. Skull base and vertebrae: Again seen is the fracture through the anterior arch of C1, stable since prior study. As noted before, margins appear sclerotic and some areas but irregular and others. This is compatible with subacute or chronic fracture. No acute fracture. No focal bone lesion. Soft tissues and spinal canal: No prevertebral fluid or swelling. No visible canal hematoma. Disc levels: Calcified disc protrusions again seen from C4-5 through C6-7, stable. Mild to moderate diffuse degenerative facet disease. Upper chest: No acute findings Other: None IMPRESSION: Stable appearance of the fracture through the anterior arch of C1, subacute to chronic. No acute fracture seen. Degenerative changes. Calcified disc bulges noted from C4-5 through C6-7, stable. Electronically Signed   By: Rolm Baptise M.D.   On: 08/26/2021 13:23   DG Pelvis Portable  Result Date: 08/26/2021 CLINICAL DATA:  Fall. EXAM: PORTABLE PELVIS 1-2 VIEWS COMPARISON:  06/27/2021 FINDINGS: Pelvic bony ring is intact. Symmetric appearance of the SI joints. No gross abnormality to either hip. Nonobstructive bowel gas pattern in the lower abdomen and pelvis. IMPRESSION: No acute abnormality. Electronically Signed   By: Markus Daft M.D.   On:  08/26/2021 13:27    ____________________________________________   PROCEDURES  Procedure(s) performed (including Critical Care):  Procedures   ____________________________________________   INITIAL IMPRESSION / ASSESSMENT AND PLAN / ED COURSE      Patient presents above-stated history exam for assessment of skin tear in the left forearm as well as some acute on chronic pain in the low back possibly in the neck and on the head after she had a fall today but she states she just lost her balance while leaning over wearing hard collar.  He did not feel lightheaded or dizzy denies any associated chest pain or shortness of breath.  She is afebrile hemodynamically stable arrival.  Suspect this is likely the result of her being a hard collar and typically using a walker and leaning over significantly to try to clean up the stain.  She denies any other symptoms or dizziness and her history and exam is not consistent with an acute CVA, ACS, acute infectious process or metabolic derangements.   CT head and C-spine showed no evidence of skull fracture, intracranial hemorrhage and stable appearance of previously noted C1 fracture without any acute fracture seen.  There is some degenerative changes noted.  Plain film of the T and L-spine showed no acute thoracic or lumbar fractures.  Plain film of the left elbow shows no fracture dislocation but some soft tissue swelling.  Plain film of the pelvis shows no acute fracture dislocation but on pelvis and lumbar plain film there is a rounded hyperdense lesion over the left  sacrum of unclear significance.  Discussed follows up with CT versus outpatient imaging in a couple days and patient and family strongly for CT today.  CT abdomen pelvis shows no other acute injury and previously seen rounded lesion not clearly identified likely reflecting some stool.  Overall I have low suspicion for other significant occult injury or other Mi life-threatening process.  I  think patient is stable for discharge with close outpatient PCP and neurosurgery follow-up which she already has an appointment for from her previous C1 injury.  She will maintain her hard collar.  She is already on tramadol prescribed prior to arrival.  She may continue this.  Discharged stable condition.  Strict return precautions advised and discussed.  Tetanus updated for abrasion on the elbow.     ____________________________________________   FINAL CLINICAL IMPRESSION(S) / ED DIAGNOSES  Final diagnoses:  Fall, initial encounter  Abrasion of left elbow, initial encounter  Closed nondisplaced fracture of first cervical vertebra with routine healing, unspecified fracture morphology, subsequent encounter  Acute bilateral low back pain without sciatica    Medications  acetaminophen (TYLENOL) tablet 1,000 mg (1,000 mg Oral Given 08/26/21 1238)  Tdap (BOOSTRIX) injection 0.5 mL (0.5 mLs Intramuscular Given 08/26/21 1239)  traMADol (ULTRAM) tablet 50 mg (50 mg Oral Given 08/26/21 1238)     ED Discharge Orders     None        Note:  This document was prepared using Dragon voice recognition software and may include unintentional dictation errors.    Lucrezia Starch, MD 08/26/21 [phone]

## 2021-08-26 NOTE — ED Triage Notes (Signed)
Pt comes via [facility] from home with c/o unwitnessed fall. Pt states she leaned forward to bend down and loss her balance.   Pt is in neck brace due to C1 fracture from previous fall.  Pt stats she did hit her head and has abrasion to left forearm. Bandage in placed and bleeding controlled. No loC pt does take daily aspirin.  VSS

## 2021-08-28 ENCOUNTER — Ambulatory Visit (HOSPITAL_COMMUNITY)
Admission: RE | Admit: 2021-08-28 | Discharge: 2021-08-28 | Disposition: A | Discharge status: Home / Self Care | Payer: Medicare Other | Source: Ambulatory Visit | Attending: Student | Admitting: Student

## 2021-08-28 ENCOUNTER — Other Ambulatory Visit: Payer: Self-pay

## 2021-08-28 DIAGNOSIS — M48062 Spinal stenosis, lumbar region with neurogenic claudication: Secondary | ICD-10-CM | POA: Diagnosis not present

## 2021-08-28 DIAGNOSIS — S129XXD Fracture of neck, unspecified, subsequent encounter: Secondary | ICD-10-CM | POA: Diagnosis not present

## 2021-08-28 DIAGNOSIS — G3184 Mild cognitive impairment, so stated: Secondary | ICD-10-CM | POA: Insufficient documentation

## 2021-08-28 DIAGNOSIS — G319 Degenerative disease of nervous system, unspecified: Secondary | ICD-10-CM | POA: Diagnosis not present

## 2021-08-28 DIAGNOSIS — E785 Hyperlipidemia, unspecified: Secondary | ICD-10-CM | POA: Diagnosis not present

## 2021-08-28 DIAGNOSIS — G2581 Restless legs syndrome: Secondary | ICD-10-CM | POA: Diagnosis not present

## 2021-08-28 DIAGNOSIS — G8929 Other chronic pain: Secondary | ICD-10-CM | POA: Diagnosis not present

## 2021-08-28 DIAGNOSIS — M5137 Other intervertebral disc degeneration, lumbosacral region: Secondary | ICD-10-CM | POA: Diagnosis not present

## 2021-09-05 ENCOUNTER — Telehealth: Payer: Self-pay

## 2021-09-05 DIAGNOSIS — G2581 Restless legs syndrome: Secondary | ICD-10-CM | POA: Diagnosis not present

## 2021-09-05 DIAGNOSIS — E785 Hyperlipidemia, unspecified: Secondary | ICD-10-CM | POA: Diagnosis not present

## 2021-09-05 DIAGNOSIS — M5137 Other intervertebral disc degeneration, lumbosacral region: Secondary | ICD-10-CM | POA: Diagnosis not present

## 2021-09-05 DIAGNOSIS — G8929 Other chronic pain: Secondary | ICD-10-CM | POA: Diagnosis not present

## 2021-09-05 DIAGNOSIS — S129XXD Fracture of neck, unspecified, subsequent encounter: Secondary | ICD-10-CM | POA: Diagnosis not present

## 2021-09-05 DIAGNOSIS — M48062 Spinal stenosis, lumbar region with neurogenic claudication: Secondary | ICD-10-CM | POA: Diagnosis not present

## 2021-09-05 NOTE — Telephone Encounter (Signed)
Can offer what is available with any provider, [place], [place], [facility], or [facility] virtual

## 2021-09-05 NOTE — Telephone Encounter (Signed)
Copied from [place] [phone]. Topic: General - Other >> Sep 05, 2021  3:19 PM Leward Quan A wrote: Reason for CRM: Patient daughter in law [name] called in to inform Dr B that patient fell again and would like to see her ASAP. Patient would like to be able to stay home if she can get a [facility] to come in there to help her. Also had an appointment scheduled with pain management for 09/06/21 that have been postponed. Please call [name] at Ph#   [phone]

## 2021-09-10 DIAGNOSIS — S129XXD Fracture of neck, unspecified, subsequent encounter: Secondary | ICD-10-CM | POA: Diagnosis not present

## 2021-09-10 DIAGNOSIS — M48062 Spinal stenosis, lumbar region with neurogenic claudication: Secondary | ICD-10-CM | POA: Diagnosis not present

## 2021-09-10 DIAGNOSIS — M5137 Other intervertebral disc degeneration, lumbosacral region: Secondary | ICD-10-CM | POA: Diagnosis not present

## 2021-09-10 DIAGNOSIS — G2581 Restless legs syndrome: Secondary | ICD-10-CM | POA: Diagnosis not present

## 2021-09-10 DIAGNOSIS — E785 Hyperlipidemia, unspecified: Secondary | ICD-10-CM | POA: Diagnosis not present

## 2021-09-10 DIAGNOSIS — G8929 Other chronic pain: Secondary | ICD-10-CM | POA: Diagnosis not present

## 2021-09-11 DIAGNOSIS — M5137 Other intervertebral disc degeneration, lumbosacral region: Secondary | ICD-10-CM | POA: Diagnosis not present

## 2021-09-11 DIAGNOSIS — G8929 Other chronic pain: Secondary | ICD-10-CM | POA: Diagnosis not present

## 2021-09-11 DIAGNOSIS — G2581 Restless legs syndrome: Secondary | ICD-10-CM | POA: Diagnosis not present

## 2021-09-11 DIAGNOSIS — M48062 Spinal stenosis, lumbar region with neurogenic claudication: Secondary | ICD-10-CM | POA: Diagnosis not present

## 2021-09-11 DIAGNOSIS — S129XXD Fracture of neck, unspecified, subsequent encounter: Secondary | ICD-10-CM | POA: Diagnosis not present

## 2021-09-11 DIAGNOSIS — E785 Hyperlipidemia, unspecified: Secondary | ICD-10-CM | POA: Diagnosis not present

## 2021-09-12 ENCOUNTER — Ambulatory Visit (INDEPENDENT_AMBULATORY_CARE_PROVIDER_SITE_OTHER): Payer: Medicare Other | Admitting: Family Medicine

## 2021-09-12 ENCOUNTER — Encounter: Payer: Self-pay | Admitting: Family Medicine

## 2021-09-12 ENCOUNTER — Other Ambulatory Visit: Payer: Self-pay

## 2021-09-12 VITALS — BP 113/50 | HR 79 | Temp 97.9°F | Ht 59.0 in | Wt 164.0 lb

## 2021-09-12 DIAGNOSIS — W19XXXA Unspecified fall, initial encounter: Secondary | ICD-10-CM | POA: Diagnosis not present

## 2021-09-12 DIAGNOSIS — R251 Tremor, unspecified: Secondary | ICD-10-CM

## 2021-09-12 DIAGNOSIS — R2681 Unsteadiness on feet: Secondary | ICD-10-CM | POA: Diagnosis not present

## 2021-09-12 NOTE — Progress Notes (Signed)
Established patient visit   Patient: Helen Ramsey   DOB: 1933/02/15   87 y.o. Female  MRN: [id number] Visit Date: 09/12/2021  Today's healthcare provider: Shirlee Latch, MD   Chief Complaint  Patient presents with   Fall   Subjective    HPI  -Patient reported falling on October 10. Lost balance and landed on the floor.    -Patient reports she's feeling okay just restricted due to pain. Daily activity is consisting of reclining in chair. Patient reports consuming low sodium diet. Patient reports sleeping about 4 hrs at a time once she can get settled down.   Already doing home health PT with [facility]  Wants PCS - needs referral to social work  Worsening tremor - dr Sherryll Burger recommend OT.     Medications: Outpatient Medications Prior to Visit  Medication Sig   acetaminophen (TYLENOL) 500 MG tablet Take 500 mg by mouth in the morning and at bedtime.    ALPRAZolam (XANAX) 0.25 MG tablet TAKE ONE TABLET BY MOUTH TWO TIMES A DAY AS NEEDED FOR ANXIETY   aspirin EC 81 MG tablet Take 81 mg by mouth daily.   atorvastatin (LIPITOR) 20 MG tablet TAKE 1 TABLET(20 MG) BY MOUTH DAILY   BREO ELLIPTA 100-25 MCG/INH AEPB INHALE 1 PUFF INTO THE LUNGS DAILY   cetirizine (ZYRTEC) 10 MG tablet Take 10 mg daily by mouth.   Cholecalciferol (VITAMIN D3 PO) Take 1,000 Units by mouth.   citalopram (CELEXA) 40 MG tablet Take 1 tablet (40 mg total) by mouth daily.   cyanocobalamin 1000 MCG tablet Take 1,000 mcg by mouth daily.   Dentifrices (FLUORIDE TOOTHPASTE DT) Place onto teeth daily.    famotidine (PEPCID) 20 MG tablet TAKE 1 TABLET(20 MG) BY MOUTH DAILY   furosemide (LASIX) 80 MG tablet Take 1 tablet (80 mg total) by mouth daily.   gabapentin (NEURONTIN) 100 MG capsule Take 2 capsules (200 mg total) by mouth 2 (two) times daily. Start Gabapentin 200 mg (one tablet) in the morning, 200 mg in the afternoon (one tablet), and 300 mg in the evening.   gabapentin (NEURONTIN) 300 MG  capsule Take 1 capsule (300 mg total) by mouth at bedtime.   Galcanezumab-gnlm (EMGALITY [place]) Inject 300 mg as directed every 30 (thirty) days.    ketoconazole (NIZORAL) 2 % cream Apply 1 application topically daily.   levothyroxine (SYNTHROID) 50 MCG tablet Take 1 tablet (50 mcg total) by mouth daily before breakfast.   lisinopril (ZESTRIL) 5 MG tablet Take 1 tablet (5 mg total) by mouth daily.   MAGNESIUM GLYCINATE PO Take 500 mg by mouth daily.   melatonin 5 MG TABS Take 5 mg by mouth at bedtime as needed.   Omega-3 Fatty Acids (FISH OIL) 1200 MG CAPS Take 2 capsules by mouth daily.    omeprazole (PRILOSEC) 20 MG capsule TAKE 1 CAPSULE(20 MG) BY MOUTH DAILY   phenazopyridine (AZO-TABS) 95 MG tablet Take 1 tablet (95 mg total) by mouth as needed. (Patient taking differently: Take 95 mg by mouth 2 (two) times daily.)   Polyethylene Glycol 3350 (MIRALAX PO) Take by mouth.   potassium gluconate 595 (99 K) MG TABS tablet Take 1 tablet (595 mg total) by mouth 2 (two) times daily.   sodium chloride (OCEAN) 0.65 % nasal spray Place 1 spray into the nose as needed.    traMADol (ULTRAM) 50 MG tablet TAKE 1 TABLET BY MOUTH TWICE DAILY AS NEEDED (Patient taking differently: Take 50 mg by  mouth 3 (three) times daily. TAKE 1 TABLET BY MOUTH TWICE DAILY AS NEEDED)   No facility-administered medications prior to visit.    Review of Systems - per HPI  Last CBC Lab Results  Component Value Date   WBC 6.7 08/15/2021   HGB 10.9 (L) 08/15/2021   HCT 32.2 (L) 08/15/2021   MCV 87 08/15/2021   MCH 29.5 08/15/2021   RDW 13.8 08/15/2021   PLT 313 08/15/2021   Last metabolic panel Lab Results  Component Value Date   GLUCOSE 104 (H) 08/15/2021   NA 133 (L) 08/15/2021   K 3.9 08/15/2021   CL 96 08/15/2021   CO2 23 08/15/2021   BUN 26 08/15/2021   CREATININE 1.27 (H) 08/15/2021   EGFR 41 (L) 08/15/2021   CALCIUM 9.5 08/15/2021   PROT 7.4 08/15/2021   ALBUMIN 3.9 08/15/2021   LABGLOB 3.5 08/15/2021    AGRATIO 1.1 (L) 08/15/2021   BILITOT 0.3 08/15/2021   ALKPHOS 70 08/15/2021   AST 33 08/15/2021   ALT 24 08/15/2021   ANIONGAP 7 03/26/2021   Last lipids Lab Results  Component Value Date   CHOL 182 08/15/2021   HDL 28 (L) 08/15/2021   LDLCALC 93 08/15/2021   TRIG 367 (H) 08/15/2021   CHOLHDL 6.5 (H) 08/15/2021   Last hemoglobin A1c Lab Results  Component Value Date   HGBA1C 5.1 02/27/2020   Last thyroid functions Lab Results  Component Value Date   TSH 6.610 (H) 08/15/2021   Last vitamin D Lab Results  Component Value Date   VD25OH 38.6 02/27/2020       Objective    BP (!) 113/50 (BP Location: Left Arm, Patient Position: Sitting, Cuff Size: Large)   Pulse 79   Temp 97.9 F (36.6 C) (Temporal)   Ht 4\' 11"  (1.499 m)   Wt 164 lb (74.4 kg)   SpO2 95%   BMI 33.12 kg/m  BP Readings from Last 3 Encounters:  09/12/21 (!) 113/50  08/26/21 120/89  08/15/21 (!) 128/48   Wt Readings from Last 3 Encounters:  09/12/21 164 lb (74.4 kg)  08/26/21 175 lb (79.4 kg)  08/15/21 177 lb (80.3 kg)      Physical Exam Vitals reviewed.  Constitutional:      General: She is not in acute distress.    Appearance: Normal appearance. She is well-developed. She is not diaphoretic.  Eyes:     General: No scleral icterus.    Conjunctiva/sclera: Conjunctivae normal.  Neck:     Thyroid: No thyromegaly.     Comments:  C collar Cardiovascular:     Rate and Rhythm: Normal rate and regular rhythm.     Pulses: Normal pulses.     Heart sounds: Normal heart sounds. No murmur heard. Pulmonary:     Effort: Pulmonary effort is normal. No respiratory distress.     Breath sounds: Normal breath sounds. No wheezing, rhonchi or rales.  Musculoskeletal:     Right lower leg: No edema.     Left lower leg: No edema.  Skin:    General: Skin is warm and dry.     Findings: No rash.  Neurological:     Mental Status: She is alert and oriented to person, place, and time. Mental status is at  baseline.  Psychiatric:        Mood and Affect: Mood normal.        Behavior: Behavior normal.      No results found for any visits on 09/12/21.  Assessment & Plan     Problem List Items Addressed This Visit       Other   Gait instability   Relevant Orders   Ambulatory referral to Home Health   Tremor   Relevant Orders   Ambulatory referral to Home Health   Other Visit Diagnoses     Fall, initial encounter    -  Primary   Relevant Orders   Ambulatory referral to Home Health      - continues to fall and lose her balance - already was checked out with imaging after this recent fall - continue [facility] PT - will send referral for [facility] SW and OT  - hoping to get PCS for more in home aide  No follow-ups on file.      Total time spent on today's visit was greater than 30 minutes, including both face-to-face time and nonface-to-face time personally spent on review of chart (labs and imaging), discussing labs and goals, discussing further work-up, treatment options, referrals to specialist if needed, reviewing outside records of pertinent, answering patient's questions, and coordinating care.    I, Shirlee Latch, MD, have reviewed all documentation for this visit. The documentation on 09/13/21 for the exam, diagnosis, procedures, and orders are all accurate and complete.   Nitesh Pitstick, Marzella Schlein, MD, MPH [facility]

## 2021-09-17 DIAGNOSIS — Z79899 Other long term (current) drug therapy: Secondary | ICD-10-CM | POA: Diagnosis not present

## 2021-09-17 DIAGNOSIS — M5416 Radiculopathy, lumbar region: Secondary | ICD-10-CM | POA: Diagnosis not present

## 2021-09-17 DIAGNOSIS — M48062 Spinal stenosis, lumbar region with neurogenic claudication: Secondary | ICD-10-CM | POA: Diagnosis not present

## 2021-09-17 DIAGNOSIS — M7062 Trochanteric bursitis, left hip: Secondary | ICD-10-CM | POA: Diagnosis not present

## 2021-09-17 DIAGNOSIS — M47816 Spondylosis without myelopathy or radiculopathy, lumbar region: Secondary | ICD-10-CM | POA: Diagnosis not present

## 2021-09-17 DIAGNOSIS — M5136 Other intervertebral disc degeneration, lumbar region: Secondary | ICD-10-CM | POA: Diagnosis not present

## 2021-09-17 DIAGNOSIS — M4726 Other spondylosis with radiculopathy, lumbar region: Secondary | ICD-10-CM | POA: Diagnosis not present

## 2021-09-18 DIAGNOSIS — E785 Hyperlipidemia, unspecified: Secondary | ICD-10-CM | POA: Diagnosis not present

## 2021-09-18 DIAGNOSIS — Z86000 Personal history of in-situ neoplasm of breast: Secondary | ICD-10-CM | POA: Diagnosis not present

## 2021-09-18 DIAGNOSIS — K219 Gastro-esophageal reflux disease without esophagitis: Secondary | ICD-10-CM | POA: Diagnosis not present

## 2021-09-18 DIAGNOSIS — G8929 Other chronic pain: Secondary | ICD-10-CM | POA: Diagnosis not present

## 2021-09-18 DIAGNOSIS — F419 Anxiety disorder, unspecified: Secondary | ICD-10-CM | POA: Diagnosis not present

## 2021-09-18 DIAGNOSIS — K589 Irritable bowel syndrome without diarrhea: Secondary | ICD-10-CM | POA: Diagnosis not present

## 2021-09-18 DIAGNOSIS — Z96651 Presence of right artificial knee joint: Secondary | ICD-10-CM | POA: Diagnosis not present

## 2021-09-18 DIAGNOSIS — G4730 Sleep apnea, unspecified: Secondary | ICD-10-CM | POA: Diagnosis not present

## 2021-09-18 DIAGNOSIS — Z9181 History of falling: Secondary | ICD-10-CM | POA: Diagnosis not present

## 2021-09-18 DIAGNOSIS — M5137 Other intervertebral disc degeneration, lumbosacral region: Secondary | ICD-10-CM | POA: Diagnosis not present

## 2021-09-18 DIAGNOSIS — D649 Anemia, unspecified: Secondary | ICD-10-CM | POA: Diagnosis not present

## 2021-09-18 DIAGNOSIS — S129XXD Fracture of neck, unspecified, subsequent encounter: Secondary | ICD-10-CM | POA: Diagnosis not present

## 2021-09-18 DIAGNOSIS — Z7982 Long term (current) use of aspirin: Secondary | ICD-10-CM | POA: Diagnosis not present

## 2021-09-18 DIAGNOSIS — Z9049 Acquired absence of other specified parts of digestive tract: Secondary | ICD-10-CM | POA: Diagnosis not present

## 2021-09-18 DIAGNOSIS — F32A Depression, unspecified: Secondary | ICD-10-CM | POA: Diagnosis not present

## 2021-09-18 DIAGNOSIS — Z602 Problems related to living alone: Secondary | ICD-10-CM | POA: Diagnosis not present

## 2021-09-18 DIAGNOSIS — I10 Essential (primary) hypertension: Secondary | ICD-10-CM | POA: Diagnosis not present

## 2021-09-18 DIAGNOSIS — G2581 Restless legs syndrome: Secondary | ICD-10-CM | POA: Diagnosis not present

## 2021-09-18 DIAGNOSIS — G629 Polyneuropathy, unspecified: Secondary | ICD-10-CM | POA: Diagnosis not present

## 2021-09-18 DIAGNOSIS — M1991 Primary osteoarthritis, unspecified site: Secondary | ICD-10-CM | POA: Diagnosis not present

## 2021-09-18 DIAGNOSIS — M48062 Spinal stenosis, lumbar region with neurogenic claudication: Secondary | ICD-10-CM | POA: Diagnosis not present

## 2021-09-18 DIAGNOSIS — Z7951 Long term (current) use of inhaled steroids: Secondary | ICD-10-CM | POA: Diagnosis not present

## 2021-09-19 DIAGNOSIS — G2581 Restless legs syndrome: Secondary | ICD-10-CM | POA: Diagnosis not present

## 2021-09-19 DIAGNOSIS — M4312 Spondylolisthesis, cervical region: Secondary | ICD-10-CM | POA: Diagnosis not present

## 2021-09-19 DIAGNOSIS — E785 Hyperlipidemia, unspecified: Secondary | ICD-10-CM | POA: Diagnosis not present

## 2021-09-19 DIAGNOSIS — S129XXD Fracture of neck, unspecified, subsequent encounter: Secondary | ICD-10-CM | POA: Diagnosis not present

## 2021-09-19 DIAGNOSIS — M48062 Spinal stenosis, lumbar region with neurogenic claudication: Secondary | ICD-10-CM | POA: Diagnosis not present

## 2021-09-19 DIAGNOSIS — M47812 Spondylosis without myelopathy or radiculopathy, cervical region: Secondary | ICD-10-CM | POA: Diagnosis not present

## 2021-09-19 DIAGNOSIS — M5137 Other intervertebral disc degeneration, lumbosacral region: Secondary | ICD-10-CM | POA: Diagnosis not present

## 2021-09-19 DIAGNOSIS — G8929 Other chronic pain: Secondary | ICD-10-CM | POA: Diagnosis not present

## 2021-09-19 DIAGNOSIS — Z8781 Personal history of (healed) traumatic fracture: Secondary | ICD-10-CM | POA: Diagnosis not present

## 2021-09-19 DIAGNOSIS — S12000A Unspecified displaced fracture of first cervical vertebra, initial encounter for closed fracture: Secondary | ICD-10-CM | POA: Diagnosis not present

## 2021-09-20 DIAGNOSIS — G8929 Other chronic pain: Secondary | ICD-10-CM | POA: Diagnosis not present

## 2021-09-20 DIAGNOSIS — G2581 Restless legs syndrome: Secondary | ICD-10-CM | POA: Diagnosis not present

## 2021-09-20 DIAGNOSIS — S129XXD Fracture of neck, unspecified, subsequent encounter: Secondary | ICD-10-CM | POA: Diagnosis not present

## 2021-09-20 DIAGNOSIS — M48062 Spinal stenosis, lumbar region with neurogenic claudication: Secondary | ICD-10-CM | POA: Diagnosis not present

## 2021-09-20 DIAGNOSIS — M5137 Other intervertebral disc degeneration, lumbosacral region: Secondary | ICD-10-CM | POA: Diagnosis not present

## 2021-09-20 DIAGNOSIS — E785 Hyperlipidemia, unspecified: Secondary | ICD-10-CM | POA: Diagnosis not present

## 2021-09-24 DIAGNOSIS — M48062 Spinal stenosis, lumbar region with neurogenic claudication: Secondary | ICD-10-CM | POA: Diagnosis not present

## 2021-09-24 DIAGNOSIS — G8929 Other chronic pain: Secondary | ICD-10-CM | POA: Diagnosis not present

## 2021-09-24 DIAGNOSIS — E785 Hyperlipidemia, unspecified: Secondary | ICD-10-CM | POA: Diagnosis not present

## 2021-09-24 DIAGNOSIS — G2581 Restless legs syndrome: Secondary | ICD-10-CM | POA: Diagnosis not present

## 2021-09-24 DIAGNOSIS — S129XXD Fracture of neck, unspecified, subsequent encounter: Secondary | ICD-10-CM | POA: Diagnosis not present

## 2021-09-24 DIAGNOSIS — M5137 Other intervertebral disc degeneration, lumbosacral region: Secondary | ICD-10-CM | POA: Diagnosis not present

## 2021-09-25 DIAGNOSIS — G8929 Other chronic pain: Secondary | ICD-10-CM | POA: Diagnosis not present

## 2021-09-25 DIAGNOSIS — M48062 Spinal stenosis, lumbar region with neurogenic claudication: Secondary | ICD-10-CM | POA: Diagnosis not present

## 2021-09-25 DIAGNOSIS — E785 Hyperlipidemia, unspecified: Secondary | ICD-10-CM | POA: Diagnosis not present

## 2021-09-25 DIAGNOSIS — M5137 Other intervertebral disc degeneration, lumbosacral region: Secondary | ICD-10-CM | POA: Diagnosis not present

## 2021-09-25 DIAGNOSIS — S129XXD Fracture of neck, unspecified, subsequent encounter: Secondary | ICD-10-CM | POA: Diagnosis not present

## 2021-09-25 DIAGNOSIS — G2581 Restless legs syndrome: Secondary | ICD-10-CM | POA: Diagnosis not present

## 2021-09-30 DIAGNOSIS — G8929 Other chronic pain: Secondary | ICD-10-CM | POA: Diagnosis not present

## 2021-09-30 DIAGNOSIS — M48062 Spinal stenosis, lumbar region with neurogenic claudication: Secondary | ICD-10-CM | POA: Diagnosis not present

## 2021-09-30 DIAGNOSIS — M5137 Other intervertebral disc degeneration, lumbosacral region: Secondary | ICD-10-CM | POA: Diagnosis not present

## 2021-09-30 DIAGNOSIS — G2581 Restless legs syndrome: Secondary | ICD-10-CM | POA: Diagnosis not present

## 2021-09-30 DIAGNOSIS — S129XXD Fracture of neck, unspecified, subsequent encounter: Secondary | ICD-10-CM | POA: Diagnosis not present

## 2021-09-30 DIAGNOSIS — E785 Hyperlipidemia, unspecified: Secondary | ICD-10-CM | POA: Diagnosis not present

## 2021-10-02 DIAGNOSIS — G2581 Restless legs syndrome: Secondary | ICD-10-CM | POA: Diagnosis not present

## 2021-10-02 DIAGNOSIS — G8929 Other chronic pain: Secondary | ICD-10-CM | POA: Diagnosis not present

## 2021-10-02 DIAGNOSIS — M5137 Other intervertebral disc degeneration, lumbosacral region: Secondary | ICD-10-CM | POA: Diagnosis not present

## 2021-10-02 DIAGNOSIS — M48062 Spinal stenosis, lumbar region with neurogenic claudication: Secondary | ICD-10-CM | POA: Diagnosis not present

## 2021-10-02 DIAGNOSIS — S129XXD Fracture of neck, unspecified, subsequent encounter: Secondary | ICD-10-CM | POA: Diagnosis not present

## 2021-10-02 DIAGNOSIS — E785 Hyperlipidemia, unspecified: Secondary | ICD-10-CM | POA: Diagnosis not present

## 2021-10-08 DIAGNOSIS — G8929 Other chronic pain: Secondary | ICD-10-CM | POA: Diagnosis not present

## 2021-10-08 DIAGNOSIS — S129XXD Fracture of neck, unspecified, subsequent encounter: Secondary | ICD-10-CM | POA: Diagnosis not present

## 2021-10-08 DIAGNOSIS — M5137 Other intervertebral disc degeneration, lumbosacral region: Secondary | ICD-10-CM | POA: Diagnosis not present

## 2021-10-08 DIAGNOSIS — G2581 Restless legs syndrome: Secondary | ICD-10-CM | POA: Diagnosis not present

## 2021-10-08 DIAGNOSIS — E785 Hyperlipidemia, unspecified: Secondary | ICD-10-CM | POA: Diagnosis not present

## 2021-10-08 DIAGNOSIS — M48062 Spinal stenosis, lumbar region with neurogenic claudication: Secondary | ICD-10-CM | POA: Diagnosis not present

## 2021-10-15 DIAGNOSIS — M48062 Spinal stenosis, lumbar region with neurogenic claudication: Secondary | ICD-10-CM | POA: Diagnosis not present

## 2021-10-15 DIAGNOSIS — G8929 Other chronic pain: Secondary | ICD-10-CM | POA: Diagnosis not present

## 2021-10-15 DIAGNOSIS — S129XXD Fracture of neck, unspecified, subsequent encounter: Secondary | ICD-10-CM | POA: Diagnosis not present

## 2021-10-15 DIAGNOSIS — M5137 Other intervertebral disc degeneration, lumbosacral region: Secondary | ICD-10-CM | POA: Diagnosis not present

## 2021-10-15 DIAGNOSIS — E785 Hyperlipidemia, unspecified: Secondary | ICD-10-CM | POA: Diagnosis not present

## 2021-10-15 DIAGNOSIS — G2581 Restless legs syndrome: Secondary | ICD-10-CM | POA: Diagnosis not present

## 2021-10-16 DIAGNOSIS — M5137 Other intervertebral disc degeneration, lumbosacral region: Secondary | ICD-10-CM | POA: Diagnosis not present

## 2021-10-16 DIAGNOSIS — G8929 Other chronic pain: Secondary | ICD-10-CM | POA: Diagnosis not present

## 2021-10-16 DIAGNOSIS — G2581 Restless legs syndrome: Secondary | ICD-10-CM | POA: Diagnosis not present

## 2021-10-16 DIAGNOSIS — E785 Hyperlipidemia, unspecified: Secondary | ICD-10-CM | POA: Diagnosis not present

## 2021-10-16 DIAGNOSIS — S129XXD Fracture of neck, unspecified, subsequent encounter: Secondary | ICD-10-CM | POA: Diagnosis not present

## 2021-10-16 DIAGNOSIS — M48062 Spinal stenosis, lumbar region with neurogenic claudication: Secondary | ICD-10-CM | POA: Diagnosis not present

## 2021-11-20 DIAGNOSIS — M79601 Pain in right arm: Secondary | ICD-10-CM | POA: Diagnosis not present

## 2021-11-20 DIAGNOSIS — M79602 Pain in left arm: Secondary | ICD-10-CM | POA: Diagnosis not present

## 2021-11-22 NOTE — Progress Notes (Deleted)
Established patient visit   Patient: Helen Ramsey   DOB: September 20, 1933   88 y.o. Female  MRN: [id number] Visit Date: 11/25/2021  Today's healthcare provider: Lavon Paganini, MD   No chief complaint on file.  Subjective    HPI  Hypertension, follow-up  BP Readings from Last 3 Encounters:  09/12/21 (!) 113/50  08/26/21 120/89  08/15/21 (!) 128/48   Wt Readings from Last 3 Encounters:  09/12/21 164 lb (74.4 kg)  08/26/21 175 lb (79.4 kg)  08/15/21 177 lb (80.3 kg)     She was last seen for hypertension {NUMBERS 1-12:18279} {days/wks/mos/yrs:310907} ago.  BP at that visit was ***. Management since that visit includes ***.  She reports {excellent/good/fair/poor:19665} compliance with treatment. She {is/is not:9024} having side effects. {document side effects if present:1} She is following a {diet:21022986} diet. She {is/is not:9024} exercising. She {does/does not:200015} smoke.  Use of agents associated with hypertension: {bp agents assoc with hypertension:511::"none"}.   Outside blood pressures are {***enter patient reported home BP readings, or 'not being checked':1}. Symptoms: {Yes/No:20286} chest pain {Yes/No:20286} chest pressure  {Yes/No:20286} palpitations {Yes/No:20286} syncope  {Yes/No:20286} dyspnea {Yes/No:20286} orthopnea  {Yes/No:20286} paroxysmal nocturnal dyspnea {Yes/No:20286} lower extremity edema   Pertinent labs: Lab Results  Component Value Date   CHOL 182 08/15/2021   HDL 28 (L) 08/15/2021   LDLCALC 93 08/15/2021   TRIG 367 (H) 08/15/2021   CHOLHDL 6.5 (H) 08/15/2021   Lab Results  Component Value Date   NA 133 (L) 08/15/2021   K 3.9 08/15/2021   CREATININE 1.27 (H) 08/15/2021   EGFR 41 (L) 08/15/2021   GLUCOSE 104 (H) 08/15/2021   TSH 6.610 (H) 08/15/2021     The ASCVD Risk score (Arnett DK, et al., 2019) failed to calculate for the following reasons:   The 2019 ASCVD risk score is only valid for ages 38 to 52   The patient  has a prior MI or stroke diagnosis   --------------------------------------------------------------------------------------------------- Hypothyroid, follow-up  Lab Results  Component Value Date   TSH 6.610 (H) 08/15/2021   TSH 3.900 05/16/2021   TSH 8.560 (H) 02/14/2021    Wt Readings from Last 3 Encounters:  09/12/21 164 lb (74.4 kg)  08/26/21 175 lb (79.4 kg)  08/15/21 177 lb (80.3 kg)    She was last seen for hypothyroid {NUMBERS 1-12:18279} {days/wks/mos/yrs:310907} ago.  Management since that visit includes ***. She reports {excellent/good/fair/poor:19665} compliance with treatment. She {is/is not:21021397} having side effects. {document side effects if present:1}  Symptoms: {Yes/No:20286} change in energy level {Yes/No:20286} constipation  {Yes/No:20286} diarrhea {Yes/No:20286} heat / cold intolerance  {Yes/No:20286} nervousness {Yes/No:20286} palpitations  {Yes/No:20286} weight changes    -----------------------------------------------------------------------------------------   Medications: Outpatient Medications Prior to Visit  Medication Sig   acetaminophen (TYLENOL) 500 MG tablet Take 500 mg by mouth in the morning and at bedtime.    ALPRAZolam (XANAX) 0.25 MG tablet TAKE ONE TABLET BY MOUTH TWO TIMES A DAY AS NEEDED FOR ANXIETY   aspirin EC 81 MG tablet Take 81 mg by mouth daily.   atorvastatin (LIPITOR) 20 MG tablet TAKE 1 TABLET(20 MG) BY MOUTH DAILY   BREO ELLIPTA 100-25 MCG/INH AEPB INHALE 1 PUFF INTO THE LUNGS DAILY   cetirizine (ZYRTEC) 10 MG tablet Take 10 mg daily by mouth.   Cholecalciferol (VITAMIN D3 PO) Take 1,000 Units by mouth.   citalopram (CELEXA) 40 MG tablet Take 1 tablet (40 mg total) by mouth daily.   cyanocobalamin 1000 MCG tablet Take 1,000 mcg by  mouth daily.   Dentifrices (FLUORIDE TOOTHPASTE DT) Place onto teeth daily.    famotidine (PEPCID) 20 MG tablet TAKE 1 TABLET(20 MG) BY MOUTH DAILY   furosemide (LASIX) 80 MG tablet Take 1  tablet (80 mg total) by mouth daily.   gabapentin (NEURONTIN) 100 MG capsule Take 2 capsules (200 mg total) by mouth 2 (two) times daily. Start Gabapentin 200 mg (one tablet) in the morning, 200 mg in the afternoon (one tablet), and 300 mg in the evening.   gabapentin (NEURONTIN) 300 MG capsule Take 1 capsule (300 mg total) by mouth at bedtime.   Galcanezumab-gnlm (EMGALITY [place]) Inject 300 mg as directed every 30 (thirty) days.    ketoconazole (NIZORAL) 2 % cream Apply 1 application topically daily.   levothyroxine (SYNTHROID) 50 MCG tablet Take 1 tablet (50 mcg total) by mouth daily before breakfast.   lisinopril (ZESTRIL) 5 MG tablet Take 1 tablet (5 mg total) by mouth daily.   MAGNESIUM GLYCINATE PO Take 500 mg by mouth daily.   melatonin 5 MG TABS Take 5 mg by mouth at bedtime as needed.   Omega-3 Fatty Acids (FISH OIL) 1200 MG CAPS Take 2 capsules by mouth daily.    omeprazole (PRILOSEC) 20 MG capsule TAKE 1 CAPSULE(20 MG) BY MOUTH DAILY   phenazopyridine (AZO-TABS) 95 MG tablet Take 1 tablet (95 mg total) by mouth as needed. (Patient taking differently: Take 95 mg by mouth 2 (two) times daily.)   Polyethylene Glycol 3350 (MIRALAX PO) Take by mouth.   potassium gluconate 595 (99 K) MG TABS tablet Take 1 tablet (595 mg total) by mouth 2 (two) times daily.   sodium chloride (OCEAN) 0.65 % nasal spray Place 1 spray into the nose as needed.    traMADol (ULTRAM) 50 MG tablet TAKE 1 TABLET BY MOUTH TWICE DAILY AS NEEDED (Patient taking differently: Take 50 mg by mouth 3 (three) times daily. TAKE 1 TABLET BY MOUTH TWICE DAILY AS NEEDED)   No facility-administered medications prior to visit.    Review of Systems  {Labs   Heme   Chem   Endocrine   Serology   Results Review (optional):23779}   Objective    There were no vitals taken for this visit. {Show previous vital signs (optional):23777}  Physical Exam  ***  No results found for any visits on 11/25/21.  Assessment & Plan      ***  No follow-ups on file.      {provider attestation***:1}   Lavon Paganini, MD  [facility] [phone] (phone) [phone] (fax)  [place]

## 2021-11-25 ENCOUNTER — Ambulatory Visit: Payer: Medicare Other | Admitting: Family Medicine

## 2021-11-25 DIAGNOSIS — E039 Hypothyroidism, unspecified: Secondary | ICD-10-CM

## 2021-11-25 DIAGNOSIS — E782 Mixed hyperlipidemia: Secondary | ICD-10-CM

## 2021-11-25 DIAGNOSIS — F411 Generalized anxiety disorder: Secondary | ICD-10-CM

## 2021-11-25 DIAGNOSIS — I10 Essential (primary) hypertension: Secondary | ICD-10-CM

## 2021-12-04 NOTE — Progress Notes (Signed)
Established patient visit   Patient: Helen Ramsey   DOB: 10/15/1933   88 y.o. Female  MRN: [id number] Visit Date: 12/06/2021  Today's healthcare provider: Shirlee Latch, MD   I,Joseline E Rosas,acting as a scribe for Shirlee Latch, MD.,have documented all relevant documentation on the behalf of Shirlee Latch, MD,as directed by  Shirlee Latch, MD while in the presence of Shirlee Latch, MD.   Chief Complaint  Patient presents with   follow-up chronic disease   Subjective    HPI  Hypertension, follow-up  BP Readings from Last 3 Encounters:  12/06/21 (!) 120/58  09/12/21 (!) 113/50  08/26/21 120/89   Wt Readings from Last 3 Encounters:  12/06/21 160 lb 11.2 oz (72.9 kg)  09/12/21 164 lb (74.4 kg)  08/26/21 175 lb (79.4 kg)     She was last seen for hypertension 3 months ago.  BP at that visit was 128/89 . Management since that visit includes continue laxis 80 mg and lisinopril 5 mg daily. Continue to monitor BMP, not currently on K supplement. She reports excellent compliance with treatment. She is not having side effects.  Outside blood pressures are 130's/58's-60's.  She does not smoke.  Use of agents associated with hypertension: none.   --------------------------------------------------------------------------------------------------- Lipid/Cholesterol, follow-up  Last Lipid Panel: Lab Results  Component Value Date   CHOL 182 08/15/2021   LDLCALC 93 08/15/2021   HDL 28 (L) 08/15/2021   TRIG 367 (H) 08/15/2021    She was last seen for this 3 months ago.  Management since that visit includes no changes, continue statin.  She reports excellent compliance with treatment. She is not having side effects.   Symptoms: Yes appetite changes No foot ulcerations  No chest pain No chest pressure/discomfort  No dyspnea No orthopnea  Yes fatigue Yes lower extremity edema  No palpitations No paroxysmal nocturnal dyspnea  Yes nausea Yes  numbness or tingling of extremity  No polydipsia No polyuria  No speech difficulty No syncope    Last metabolic panel Lab Results  Component Value Date   GLUCOSE 104 (H) 08/15/2021   NA 133 (L) 08/15/2021   K 3.9 08/15/2021   BUN 26 08/15/2021   CREATININE 1.27 (H) 08/15/2021   EGFR 41 (L) 08/15/2021   GFRNONAA 53 (L) 03/26/2021   CALCIUM 9.5 08/15/2021   AST 33 08/15/2021   ALT 24 08/15/2021   The ASCVD Risk score (Arnett DK, et al., 2019) failed to calculate for the following reasons:   The 2019 ASCVD risk score is only valid for ages 60 to 74   The patient has a prior MI or stroke diagnosis  --------------------------------------------------------------------------------------------------- Hypothyroid, follow-up  Lab Results  Component Value Date   TSH 6.610 (H) 08/15/2021   TSH 3.900 05/16/2021   TSH 8.560 (H) 02/14/2021    Wt Readings from Last 3 Encounters:  12/06/21 160 lb 11.2 oz (72.9 kg)  09/12/21 164 lb (74.4 kg)  08/26/21 175 lb (79.4 kg)    She was last seen for hypothyroid 3 months ago.  Management since that visit includes TSH slightly elevated, increase synthroid to daily. She reports excellent compliance with treatment. She is not having side effects.   Symptoms: Yes change in energy level No constipation  Yes diarrhea No heat / cold intolerance  Yes nervousness No palpitations  Yes weight changes    ----------------------------------------------------------------------------------------- Depression/Anxiety, Follow-up  She  was last seen for this 3 months ago. Changes made at last visit include  no changes continue Celexa 40 mg daily.   She reports excellent compliance with treatment. She is not having side effects.   Depression screen [facility] 2/9 12/06/2021 09/12/2021 08/15/2021  Decreased Interest 1 3 0  Down, Depressed, Hopeless 1 3 0  PHQ - 2 Score 2 6 0  Altered sleeping 0 2 0  Tired, decreased energy 3 3 3   Change in appetite 1 3  0  Feeling bad or failure about yourself  0 0 0  Trouble concentrating 0 0 0  Moving slowly or fidgety/restless 0 3 2  Suicidal thoughts 0 0 0  PHQ-9 Score 6 17 5   Difficult doing work/chores Somewhat difficult Very difficult Not difficult at all  Some recent data might be hidden    -----------------------------------------------------------------------------------------   Medications: Outpatient Medications Prior to Visit  Medication Sig   acetaminophen (TYLENOL) 500 MG tablet Take 500 mg by mouth in the morning and at bedtime.    ALPRAZolam (XANAX) 0.25 MG tablet TAKE ONE TABLET BY MOUTH TWO TIMES A DAY AS NEEDED FOR ANXIETY   aspirin EC 81 MG tablet Take 81 mg by mouth daily.   atorvastatin (LIPITOR) 20 MG tablet TAKE 1 TABLET(20 MG) BY MOUTH DAILY   BREO ELLIPTA 100-25 MCG/INH AEPB INHALE 1 PUFF INTO THE LUNGS DAILY   cetirizine (ZYRTEC) 10 MG tablet Take 10 mg daily by mouth.   Cholecalciferol (VITAMIN D3 PO) Take 1,000 Units by mouth.   citalopram (CELEXA) 40 MG tablet Take 1 tablet (40 mg total) by mouth daily.   cyanocobalamin 1000 MCG tablet Take 1,000 mcg by mouth daily.   Dentifrices (FLUORIDE TOOTHPASTE DT) Place onto teeth daily.    famotidine (PEPCID) 20 MG tablet TAKE 1 TABLET(20 MG) BY MOUTH DAILY   furosemide (LASIX) 80 MG tablet Take 1 tablet (80 mg total) by mouth daily.   gabapentin (NEURONTIN) 100 MG capsule Take 2 capsules (200 mg total) by mouth 2 (two) times daily. Start Gabapentin 200 mg (one tablet) in the morning, 200 mg in the afternoon (one tablet), and 300 mg in the evening.   gabapentin (NEURONTIN) 300 MG capsule Take 1 capsule (300 mg total) by mouth at bedtime.   Galcanezumab-gnlm (EMGALITY [place]) Inject 300 mg as directed every 30 (thirty) days.    ketoconazole (NIZORAL) 2 % cream Apply 1 application topically daily.   levothyroxine (SYNTHROID) 50 MCG tablet Take 1 tablet (50 mcg total) by mouth daily before breakfast.   lisinopril (ZESTRIL) 5 MG tablet  Take 1 tablet (5 mg total) by mouth daily.   MAGNESIUM GLYCINATE PO Take 500 mg by mouth daily.   melatonin 5 MG TABS Take 5 mg by mouth at bedtime as needed.   Omega-3 Fatty Acids (FISH OIL) 1200 MG CAPS Take 2 capsules by mouth daily.    omeprazole (PRILOSEC) 20 MG capsule TAKE 1 CAPSULE(20 MG) BY MOUTH DAILY   phenazopyridine (AZO-TABS) 95 MG tablet Take 1 tablet (95 mg total) by mouth as needed. (Patient taking differently: Take 95 mg by mouth 2 (two) times daily.)   Polyethylene Glycol 3350 (MIRALAX PO) Take by mouth.   potassium gluconate 595 (99 K) MG TABS tablet Take 1 tablet (595 mg total) by mouth 2 (two) times daily.   sodium chloride (OCEAN) 0.65 % nasal spray Place 1 spray into the nose as needed.    traMADol (ULTRAM) 50 MG tablet TAKE 1 TABLET BY MOUTH TWICE DAILY AS NEEDED (Patient taking differently: Take 50 mg by mouth 3 (three) times daily.  TAKE 1 TABLET BY MOUTH 3 times daily)   No facility-administered medications prior to visit.    Review of Systems per HPI      Objective    BP (!) 120/58 (BP Location: Left Arm, Patient Position: Sitting, Cuff Size: Large)    Pulse 64    Resp 16    Wt 160 lb 11.2 oz (72.9 kg)    BMI 32.46 kg/m  BP Readings from Last 3 Encounters:  12/06/21 (!) 120/58  09/12/21 (!) 113/50  08/26/21 120/89   Wt Readings from Last 3 Encounters:  12/06/21 160 lb 11.2 oz (72.9 kg)  09/12/21 164 lb (74.4 kg)  08/26/21 175 lb (79.4 kg)      Physical Exam Vitals reviewed.  Constitutional:      General: She is not in acute distress.    Appearance: Normal appearance. She is well-developed. She is not diaphoretic.  HENT:     Head: Normocephalic and atraumatic.  Eyes:     General: No scleral icterus.    Conjunctiva/sclera: Conjunctivae normal.  Neck:     Thyroid: No thyromegaly.  Cardiovascular:     Rate and Rhythm: Normal rate and regular rhythm.     Pulses: Normal pulses.     Heart sounds: Normal heart sounds. No murmur heard. Pulmonary:      Effort: Pulmonary effort is normal. No respiratory distress.     Breath sounds: Normal breath sounds. No wheezing, rhonchi or rales.  Musculoskeletal:     Cervical back: Neck supple.     Right lower leg: No edema.     Left lower leg: No edema.  Lymphadenopathy:     Cervical: No cervical adenopathy.  Skin:    General: Skin is warm and dry.     Findings: Rash (hyperpigmented, not raised, back and legs) present.     Comments: Skin breakdown on bilateral buttocks  Neurological:     Mental Status: She is alert and oriented to person, place, and time. Mental status is at baseline.  Psychiatric:        Mood and Affect: Mood normal.        Behavior: Behavior normal.      No results found for any visits on 12/06/21.  Assessment & Plan     Problem List Items Addressed This Visit       Cardiovascular and Mediastinum   Hardening of the aorta (main artery of the heart) (HCC)    Continue statin      Essential (primary) hypertension - Primary    Well controlled Continue current medications Recheck metabolic panel F/u in 6 months       Relevant Orders   Basic Metabolic Panel (BMET)   (HFpEF) heart failure with preserved ejection fraction (HCC)    F/b cardiology Well controlled, euvolemic Continue lasix Monitor BMP        Endocrine   Acquired hypothyroidism    Previously well controlled Continue Synthroid at current dose  Recheck TSH and adjust Synthroid as indicated        Relevant Orders   TSH     Musculoskeletal and Integument   Rash and nonspecific skin eruption    Hyperpigmented, not raised rash on torso and legs Unclear etiology She can continue the ketoconazole cream that was started previously Referral to Derm      Relevant Orders   Ambulatory referral to Dermatology     Other   Anemia    Stable x8 months Recheck today - if dropping will send  for workup      Relevant Orders   CBC   Mixed hyperlipidemia    Reviewed last lipid panel - well  controlled Continue statin at current dose      Gait instability    Start [facility] PT      Relevant Orders   Ambulatory referral to Home Health   Recurrent major depressive disorder, in partial remission (HCC)    Well controlled, chronic Continue celexa      Pressure injury of left buttock, stage 1    New problem Start dressings and pressure offloading HH RN        Return in about 6 months (around 06/05/2022) for chronic disease f/u.      I, Shirlee Latch, MD, have reviewed all documentation for this visit. The documentation on 12/06/21 for the exam, diagnosis, procedures, and orders are all accurate and complete.   Nerida Boivin, Marzella Schlein, MD, MPH [facility]

## 2021-12-06 ENCOUNTER — Encounter: Payer: Self-pay | Admitting: Family Medicine

## 2021-12-06 ENCOUNTER — Other Ambulatory Visit: Payer: Self-pay

## 2021-12-06 ENCOUNTER — Ambulatory Visit (INDEPENDENT_AMBULATORY_CARE_PROVIDER_SITE_OTHER): Payer: Medicare Other | Admitting: Family Medicine

## 2021-12-06 VITALS — BP 120/58 | HR 64 | Resp 16 | Wt 160.7 lb

## 2021-12-06 DIAGNOSIS — I5032 Chronic diastolic (congestive) heart failure: Secondary | ICD-10-CM

## 2021-12-06 DIAGNOSIS — I700 Atherosclerosis of aorta: Secondary | ICD-10-CM

## 2021-12-06 DIAGNOSIS — R21 Rash and other nonspecific skin eruption: Secondary | ICD-10-CM

## 2021-12-06 DIAGNOSIS — R2681 Unsteadiness on feet: Secondary | ICD-10-CM

## 2021-12-06 DIAGNOSIS — L89321 Pressure ulcer of left buttock, stage 1: Secondary | ICD-10-CM | POA: Insufficient documentation

## 2021-12-06 DIAGNOSIS — I10 Essential (primary) hypertension: Secondary | ICD-10-CM | POA: Diagnosis not present

## 2021-12-06 DIAGNOSIS — L89322 Pressure ulcer of left buttock, stage 2: Secondary | ICD-10-CM | POA: Diagnosis not present

## 2021-12-06 DIAGNOSIS — E782 Mixed hyperlipidemia: Secondary | ICD-10-CM | POA: Diagnosis not present

## 2021-12-06 DIAGNOSIS — E039 Hypothyroidism, unspecified: Secondary | ICD-10-CM | POA: Diagnosis not present

## 2021-12-06 DIAGNOSIS — D649 Anemia, unspecified: Secondary | ICD-10-CM

## 2021-12-06 DIAGNOSIS — F3341 Major depressive disorder, recurrent, in partial remission: Secondary | ICD-10-CM

## 2021-12-06 NOTE — Assessment & Plan Note (Signed)
Stable x8 months Recheck today - if dropping will send for workup

## 2021-12-06 NOTE — Assessment & Plan Note (Signed)
Well controlled, chronic Continue celexa

## 2021-12-06 NOTE — Assessment & Plan Note (Signed)
Well controlled Continue current medications Recheck metabolic panel F/u in 6 months  

## 2021-12-06 NOTE — Assessment & Plan Note (Signed)
F/b cardiology Well controlled, euvolemic Continue lasix Monitor BMP

## 2021-12-06 NOTE — Assessment & Plan Note (Signed)
Continue statin. 

## 2021-12-06 NOTE — Assessment & Plan Note (Signed)
Reviewed last lipid panel - well controlled Continue statin at current dose

## 2021-12-06 NOTE — Assessment & Plan Note (Signed)
Start [place] PT

## 2021-12-06 NOTE — Assessment & Plan Note (Signed)
New problem Start dressings and pressure offloading HH RN

## 2021-12-06 NOTE — Assessment & Plan Note (Signed)
Previously well controlled Continue Synthroid at current dose  Recheck TSH and adjust Synthroid as indicated   

## 2021-12-06 NOTE — Assessment & Plan Note (Signed)
Hyperpigmented, not raised rash on torso and legs Unclear etiology She can continue the ketoconazole cream that was started previously Referral to [facility]

## 2021-12-07 LAB — CBC
Hematocrit: 32.8 % — ABNORMAL LOW (ref 34.0–46.6)
Hemoglobin: 11.2 g/dL (ref 11.1–15.9)
MCH: 30.3 pg (ref 26.6–33.0)
MCHC: 34.1 g/dL (ref 31.5–35.7)
MCV: 89 fL (ref 79–97)
Platelets: 273 10*3/uL (ref 150–450)
RBC: 3.7 x10E6/uL — ABNORMAL LOW (ref 3.77–5.28)
RDW: 13.6 % (ref 11.7–15.4)
WBC: 7.1 10*3/uL (ref 3.4–10.8)

## 2021-12-07 LAB — BASIC METABOLIC PANEL
BUN/Creatinine Ratio: 24 (ref 12–28)
BUN: 33 mg/dL — ABNORMAL HIGH (ref 8–27)
CO2: 23 mmol/L (ref 20–29)
Calcium: 9.5 mg/dL (ref 8.7–10.3)
Chloride: 97 mmol/L (ref 96–106)
Creatinine, Ser: 1.36 mg/dL — ABNORMAL HIGH (ref 0.57–1.00)
Glucose: 96 mg/dL (ref 70–99)
Potassium: 4.6 mmol/L (ref 3.5–5.2)
Sodium: 134 mmol/L (ref 134–144)
eGFR: 38 mL/min/{1.73_m2} — ABNORMAL LOW (ref >59)

## 2021-12-07 LAB — TSH: TSH: 3.35 u[IU]/mL (ref 0.450–4.500)

## 2021-12-08 DIAGNOSIS — Z7951 Long term (current) use of inhaled steroids: Secondary | ICD-10-CM | POA: Diagnosis not present

## 2021-12-08 DIAGNOSIS — Z6832 Body mass index (BMI) 32.0-32.9, adult: Secondary | ICD-10-CM | POA: Diagnosis not present

## 2021-12-08 DIAGNOSIS — I700 Atherosclerosis of aorta: Secondary | ICD-10-CM | POA: Diagnosis not present

## 2021-12-08 DIAGNOSIS — E039 Hypothyroidism, unspecified: Secondary | ICD-10-CM | POA: Diagnosis not present

## 2021-12-08 DIAGNOSIS — Z7982 Long term (current) use of aspirin: Secondary | ICD-10-CM | POA: Diagnosis not present

## 2021-12-08 DIAGNOSIS — F3341 Major depressive disorder, recurrent, in partial remission: Secondary | ICD-10-CM | POA: Diagnosis not present

## 2021-12-08 DIAGNOSIS — D649 Anemia, unspecified: Secondary | ICD-10-CM | POA: Diagnosis not present

## 2021-12-08 DIAGNOSIS — E782 Mixed hyperlipidemia: Secondary | ICD-10-CM | POA: Diagnosis not present

## 2021-12-08 DIAGNOSIS — I5030 Unspecified diastolic (congestive) heart failure: Secondary | ICD-10-CM | POA: Diagnosis not present

## 2021-12-08 DIAGNOSIS — I110 Hypertensive heart disease with heart failure: Secondary | ICD-10-CM | POA: Diagnosis not present

## 2021-12-08 DIAGNOSIS — L89322 Pressure ulcer of left buttock, stage 2: Secondary | ICD-10-CM | POA: Diagnosis not present

## 2021-12-10 ENCOUNTER — Other Ambulatory Visit: Payer: Self-pay | Admitting: Family Medicine

## 2021-12-10 ENCOUNTER — Ambulatory Visit: Payer: Self-pay

## 2021-12-10 DIAGNOSIS — B372 Candidiasis of skin and nail: Secondary | ICD-10-CM

## 2021-12-10 MED ORDER — KETOCONAZOLE 2 % EX CREA: 1.0000 "application " | TOPICAL_CREAM | Freq: Every day | CUTANEOUS | 1 refills | Status: DC

## 2021-12-10 NOTE — Telephone Encounter (Signed)
Please send a refill of the ketoconazole

## 2021-12-10 NOTE — Telephone Encounter (Signed)
°  Chief Complaint: med refill Symptoms: skin rash Frequency: several weeks Pertinent Negatives: NA Disposition: [] ED /[] Urgent Care (no appt availability in office) / [] Appointment(In office/virtual)/ []  [place] [facility]/ [] Home Care/ [] Refused Recommended Disposition /[] [place] [facility]/ [x]  Follow-up with PCP Additional Notes: Pt has only started to use cream every day for the last 2 weeks but needs more refills since picked up last one from pharmacy. [name] is asking if pt needs to follow up with Dr. Jacinto Reap since Derm appt isn't until 02/10/22.   Summary: Rx question   Pt was advise to use cream until she can see the dermatologist / The soonest dermatology appt open is 3.27.23/ pt wants to know if she can get a refill on ketoconazole (NIZORAL) 2 % cream/ of what Dr. B wants her to do until her dermatology appt / please advise / the cream has no refills      Answer Assessment - Initial Assessment Questions 1. DRUG NAME: "What medicine do you need to have refilled?"     ketoconazole 2. REFILLS REMAINING: "How many refills are remaining?" (Note: The label on the medicine or pill bottle will show how many refills are remaining. If there are no refills remaining, then a renewal may be needed.)     0 4. PRESCRIBING HCP: "Who prescribed it?" Reason: If prescribed by specialist, call should be referred to that group.     Dr. B  Protocols used: Medication Refill and Renewal Call-A-AH

## 2021-12-10 NOTE — Telephone Encounter (Signed)
Prescription send in. 

## 2021-12-10 NOTE — Telephone Encounter (Signed)
Medication Refill - Medication: ketoconazole (NIZORAL) 2 % cream/ wants to know if Dr. Lynnda Child refill and if she wants her to continue the cream until dermatologist appt in March   Has the patient contacted their pharmacy? No. (Agent: If no, request that the patient contact the pharmacy for the refill. If patient does not wish to contact the pharmacy document the reason why and proceed with request.) (Agent: If yes, when and what did the pharmacy advise?)  Preferred Pharmacy (with phone number or street name): [facility] #[id number] [address], [place] [address]  [place], [place] [address]  Phone:  [phone]  Fax:  [phone]  Has the patient been seen for an appointment in the last year OR does the patient have an upcoming appointment? Yes.    Agent: Please be advised that RX refills may take up to 3 business days. We ask that you follow-up with your pharmacy.

## 2021-12-10 NOTE — Telephone Encounter (Signed)
Requested Prescriptions  Pending Prescriptions Disp Refills   ketoconazole (NIZORAL) 2 % cream 45 g 1    Sig: Apply 1 application topically daily.     Over the Counter:  OTC Passed - 12/10/2021  1:11 PM      Passed - Valid encounter within last 12 months    Recent Outpatient Visits          4 days ago Essential (primary) hypertension   [facility], [name], MD   2 months ago Fall, initial encounter   [facility], [name], MD   3 months ago Essential (primary) hypertension   [facility], [name], MD   6 months ago Acute on chronic heart failure with preserved ejection fraction [facility])   [facility], [name], MD   8 months ago Gait instability   [facility] [name], MD      Future Appointments            In 5 months Bacigalupo, [name], MD [facility], [place]

## 2021-12-13 DIAGNOSIS — I110 Hypertensive heart disease with heart failure: Secondary | ICD-10-CM | POA: Diagnosis not present

## 2021-12-13 DIAGNOSIS — I5030 Unspecified diastolic (congestive) heart failure: Secondary | ICD-10-CM | POA: Diagnosis not present

## 2021-12-13 DIAGNOSIS — E039 Hypothyroidism, unspecified: Secondary | ICD-10-CM | POA: Diagnosis not present

## 2021-12-13 DIAGNOSIS — L89322 Pressure ulcer of left buttock, stage 2: Secondary | ICD-10-CM | POA: Diagnosis not present

## 2021-12-13 DIAGNOSIS — D649 Anemia, unspecified: Secondary | ICD-10-CM | POA: Diagnosis not present

## 2021-12-13 DIAGNOSIS — I700 Atherosclerosis of aorta: Secondary | ICD-10-CM | POA: Diagnosis not present

## 2021-12-16 DIAGNOSIS — I110 Hypertensive heart disease with heart failure: Secondary | ICD-10-CM | POA: Diagnosis not present

## 2021-12-16 DIAGNOSIS — E039 Hypothyroidism, unspecified: Secondary | ICD-10-CM | POA: Diagnosis not present

## 2021-12-16 DIAGNOSIS — D649 Anemia, unspecified: Secondary | ICD-10-CM | POA: Diagnosis not present

## 2021-12-16 DIAGNOSIS — I5030 Unspecified diastolic (congestive) heart failure: Secondary | ICD-10-CM | POA: Diagnosis not present

## 2021-12-16 DIAGNOSIS — L89322 Pressure ulcer of left buttock, stage 2: Secondary | ICD-10-CM | POA: Diagnosis not present

## 2021-12-16 DIAGNOSIS — I700 Atherosclerosis of aorta: Secondary | ICD-10-CM | POA: Diagnosis not present

## 2021-12-17 ENCOUNTER — Other Ambulatory Visit: Payer: Self-pay | Admitting: Family Medicine

## 2021-12-17 ENCOUNTER — Telehealth: Payer: Self-pay | Admitting: Family Medicine

## 2021-12-17 ENCOUNTER — Ambulatory Visit: Payer: Self-pay

## 2021-12-17 DIAGNOSIS — F411 Generalized anxiety disorder: Secondary | ICD-10-CM

## 2021-12-17 DIAGNOSIS — E782 Mixed hyperlipidemia: Secondary | ICD-10-CM

## 2021-12-17 MED ORDER — ATORVASTATIN CALCIUM 20 MG PO TABS: ORAL_TABLET | ORAL | 1 refills | Status: DC

## 2021-12-17 NOTE — Telephone Encounter (Signed)
Requested medication (s) are due for refill today: Yes  Requested medication (s) are on the active medication list: Yes  Last refill:  08/04/20  Future visit scheduled: Yes  Notes to clinic:  See request.    Requested Prescriptions  Pending Prescriptions Disp Refills   ALPRAZolam (XANAX) 0.25 MG tablet [Pharmacy Med Name: ALPRAZOLAM 0.25MG  TABLETS] 60 tablet     Sig: TAKE 1 TABLET BY MOUTH TWICE DAILY AS NEEDED FOR ANXIETY     Not Delegated - Psychiatry: Anxiolytics/Hypnotics 2 Failed - 12/17/2021  1:01 PM      Failed - This refill cannot be delegated      Failed - Urine Drug Screen completed in last 360 days      Passed - Patient is not pregnant      Passed - Valid encounter within last 6 months    Recent Outpatient Visits           1 week ago Essential (primary) hypertension   [facility], [name], MD   3 months ago Fall, initial encounter   [facility], [name], MD   4 months ago Essential (primary) hypertension   [facility] [place], [name], MD   7 months ago Acute on chronic heart failure with preserved ejection fraction [facility])   [facility], [name], MD   8 months ago Gait instability   [facility] [name], MD       Future Appointments             In 5 months Bacigalupo, [name], MD [facility], [place]

## 2021-12-17 NOTE — Addendum Note (Signed)
Addended by: Elliot Cousin on: 12/17/2021 05:54 PM   Modules accepted: Orders

## 2021-12-17 NOTE — Telephone Encounter (Signed)
° °  Chief Complaint: Had a panic attack Sunday Symptoms: Felt nervous and jittery inside Frequency: Sunday Pertinent Negatives: Patient denies symptoms today. Feels tired. Disposition: [] ED /[] Urgent Care (no appt availability in office) / [] Appointment(In office/virtual)/ []  [place] [facility]/ [] Home Care/ [] Refused Recommended Disposition /[] [place] [facility]/ [x]  Follow-up with PCP Additional Notes: Requesting a refill on Xanax. Please advise.   Answer Assessment - Initial Assessment Questions 1. CONCERN: "Did anything happen that prompted you to call today?"      Panic attack Sunday 2. ANXIETY SYMPTOMS: "Can you describe how you (your loved one; patient) have been feeling?" (e.g., tense, restless, panicky, anxious, keyed up, overwhelmed, sense of impending doom).      Anxious 3. ONSET: "How long have you been feeling this way?" (e.g., hours, days, weeks)     Sunday 4. SEVERITY: "How would you rate the level of anxiety?" (e.g., 0 - 10; or mild, moderate, severe).     Moderate 5. FUNCTIONAL IMPAIRMENT: "How have these feelings affected your ability to do daily activities?" "Have you had more difficulty than usual doing your normal daily activities?" (e.g., getting better, same, worse; self-care, school, work, interactions)     Yes 6. HISTORY: "Have you felt this way before?" "Have you ever been diagnosed with an anxiety problem in the past?" (e.g., generalized anxiety disorder, panic attacks, PTSD). If Yes, ask: "How was this problem treated?" (e.g., medicines, counseling, etc.)     Panic 7. RISK OF HARM - SUICIDAL IDEATION: "Do you ever have thoughts of hurting or killing yourself?" If Yes, ask:  "Do you have these feelings now?" "Do you have a plan on how you would do this?"     No 8. TREATMENT:  "What has been done so far to treat this anxiety?" (e.g., medicines, relaxation strategies). "What has helped?"     Medication 9. TREATMENT - THERAPIST: "Do you have a counselor or  therapist? Name?"     No 10. POTENTIAL TRIGGERS: "Do you drink caffeinated beverages (e.g., coffee, colas, teas), and how much daily?" "Do you drink alcohol or use any drugs?" "Have you started any new medicines recently?"     No 10. PATIENT SUPPORT: "Who is with you now?" "Who do you live with?" "Do you have family or friends who you can talk to?"        Family 83. OTHER SYMPTOMS: "Do you have any other symptoms?" (e.g., feeling depressed, trouble concentrating, trouble sleeping, trouble breathing, palpitations or fast heartbeat, chest pain, sweating, nausea, or diarrhea)       Feels tired today 12. PREGNANCY: "Is there any chance you are pregnant?" "When was your last menstrual period?"       No  Protocols used: Anxiety and Panic Attack-A-AH

## 2021-12-17 NOTE — Telephone Encounter (Signed)
Medication Refill - Medication:atorvastatin (LIPITOR) 20 MG tablet   Has the patient contacted their pharmacy? yes (Agent: If no, request that the patient contact the pharmacy for the refill. If patient does not wish to contact the pharmacy document the reason why and proceed with request.) (Agent: If yes, when and what did the pharmacy advise?)   Preferred Pharmacy (with phone number or street name): [facility] #[id number] [address], [place] Phone:  [phone]  Fax:  [phone]     Has the patient been seen for an appointment in the last year OR does the patient have an upcoming appointment? yes  Agent: Please be advised that RX refills may take up to 3 business days. We ask that you follow-up with your pharmacy.

## 2021-12-17 NOTE — Telephone Encounter (Signed)
Requested Prescriptions  Pending Prescriptions Disp Refills   atorvastatin (LIPITOR) 20 MG tablet 90 tablet 1    Sig: TAKE 1 TABLET(20 MG) BY MOUTH DAILY Strength: 20 mg     Cardiovascular:  Antilipid - Statins Failed - 12/17/2021  1:35 PM      Failed - Lipid Panel in normal range within the last 12 months    Cholesterol, Total  Date Value Ref Range Status  08/15/2021 182 100 - 199 mg/dL Final   LDL Chol Calc (NIH)  Date Value Ref Range Status  08/15/2021 93 0 - 99 mg/dL Final   HDL  Date Value Ref Range Status  08/15/2021 28 (L) >39 mg/dL Final   Triglycerides  Date Value Ref Range Status  08/15/2021 367 (H) 0 - 149 mg/dL Final         Passed - Patient is not pregnant      Passed - Valid encounter within last 12 months    Recent Outpatient Visits          1 week ago Essential (primary) hypertension   [facility], [name], MD   3 months ago Fall, initial encounter   [facility], [name], MD   4 months ago Essential (primary) hypertension   [facility] [place], [name], MD   7 months ago Acute on chronic heart failure with preserved ejection fraction [facility])   [facility], [name], MD   8 months ago Gait instability   [facility] [name], MD      Future Appointments            In 5 months Bacigalupo, [name], MD [facility], [place]

## 2021-12-19 DIAGNOSIS — R21 Rash and other nonspecific skin eruption: Secondary | ICD-10-CM | POA: Diagnosis not present

## 2021-12-19 DIAGNOSIS — L940 Localized scleroderma [morphea]: Secondary | ICD-10-CM | POA: Diagnosis not present

## 2021-12-19 NOTE — Telephone Encounter (Signed)
60 pills of Xanax was sent to her pharmacy on 1/31. She is not due for a refill

## 2021-12-23 DIAGNOSIS — L940 Localized scleroderma [morphea]: Secondary | ICD-10-CM | POA: Diagnosis not present

## 2021-12-24 ENCOUNTER — Other Ambulatory Visit: Payer: Self-pay

## 2021-12-24 ENCOUNTER — Other Ambulatory Visit: Payer: Self-pay | Admitting: Family Medicine

## 2021-12-24 DIAGNOSIS — E782 Mixed hyperlipidemia: Secondary | ICD-10-CM

## 2021-12-24 DIAGNOSIS — K219 Gastro-esophageal reflux disease without esophagitis: Secondary | ICD-10-CM

## 2021-12-24 DIAGNOSIS — G629 Polyneuropathy, unspecified: Secondary | ICD-10-CM

## 2021-12-24 MED ORDER — ATORVASTATIN CALCIUM 20 MG PO TABS: ORAL_TABLET | ORAL | 1 refills | Status: DC

## 2021-12-24 NOTE — Telephone Encounter (Signed)
Requested by interface surescripts.  Last refill 11/06/21. Requested too soon . Requested Prescriptions  Signed Prescriptions Disp Refills   famotidine (PEPCID) 20 MG tablet 90 tablet 1    Sig: TAKE 1 TABLET(20 MG) BY MOUTH DAILY     Gastroenterology:  H2 Antagonists Passed - 12/24/2021  6:21 AM      Passed - Valid encounter within last 12 months    Recent Outpatient Visits          2 weeks ago Essential (primary) hypertension   [facility], [name], MD   3 months ago Fall, initial encounter   [facility], [name], MD   4 months ago Essential (primary) hypertension   [facility], [name], MD   7 months ago Acute on chronic heart failure with preserved ejection fraction [facility])   [facility], [name], MD   8 months ago Gait instability   [facility] [name], MD      Future Appointments            In 5 months Bacigalupo, [name], MD [facility]           Refused Prescriptions Disp Refills   lisinopril (ZESTRIL) 5 MG tablet [Pharmacy Med Name: LISINOPRIL 5MG  TABLETS] 90 tablet 1    Sig: TAKE 1 TABLET(5 MG) BY MOUTH DAILY     Cardiovascular:  ACE Inhibitors Failed - 12/24/2021  6:21 AM      Failed - Cr in normal range and within 180 days    Creatinine  Date Value Ref Range Status  06/21/2014 0.83 0.60 - 1.30 mg/dL Final   Creatinine, Ser  Date Value Ref Range Status  12/06/2021 1.36 (H) 0.57 - 1.00 mg/dL Final         Passed - K in normal range and within 180 days    Potassium  Date Value Ref Range Status  12/06/2021 4.6 3.5 - 5.2 mmol/L Final  06/21/2014 3.7 3.5 - 5.1 mmol/L Final         Passed - Patient is not pregnant      Passed - Last BP in normal range    BP Readings from Last 1 Encounters:  12/06/21 (!) 120/58         Passed - Valid encounter within last 6 months    Recent Outpatient Visits           2 weeks ago Essential (primary) hypertension   [facility], [name], MD   3 months ago Fall, initial encounter   [facility], [name], MD   4 months ago Essential (primary) hypertension   [facility], [name], MD   7 months ago Acute on chronic heart failure with preserved ejection fraction [facility])   [facility], [name], MD   8 months ago Gait instability   [facility] [name], MD      Future Appointments            In 5 months Bacigalupo, [name], MD [facility], [facility]            gabapentin (NEURONTIN) 100 MG capsule [Pharmacy Med Name: GABAPENTIN 100MG  CAPSULES] 360 capsule 1    Sig: TAKE 2 CAPSULES BY MOUTH TWICE DAILY     Neurology: Anticonvulsants - gabapentin Failed - 12/24/2021  6:21 AM      Failed - Cr  in normal range and within 360 days    Creatinine  Date Value Ref Range Status  06/21/2014 0.83 0.60 - 1.30 mg/dL Final   Creatinine, Ser  Date Value Ref Range Status  12/06/2021 1.36 (H) 0.57 - 1.00 mg/dL Final         Passed - Completed PHQ-2 or PHQ-9 in the last 360 days      Passed - Valid encounter within last 12 months    Recent Outpatient Visits          2 weeks ago Essential (primary) hypertension   [facility], [name], MD   3 months ago Fall, initial encounter   [facility], [name], MD   4 months ago Essential (primary) hypertension   [facility] [place], [name], MD   7 months ago Acute on chronic heart failure with preserved ejection fraction [facility])   [facility], [name], MD   8 months ago Gait instability   [facility] [name], MD      Future Appointments            In 5 months Bacigalupo, [name], MD [facility], [place]

## 2021-12-24 NOTE — Telephone Encounter (Signed)
[place] faxed refill request for the following medications:  atorvastatin (LIPITOR) 20 MG tablet   It looks like this was printed instead of sent to the pharmacy.  Can we please send to pharmacy.\  Please advise.

## 2021-12-24 NOTE — Telephone Encounter (Signed)
Requested Prescriptions  Pending Prescriptions Disp Refills   famotidine (PEPCID) 20 MG tablet [Pharmacy Med Name: FAMOTIDINE 20MG  TABLETS] 90 tablet 1    Sig: TAKE 1 TABLET(20 MG) BY MOUTH DAILY     Gastroenterology:  H2 Antagonists Passed - 12/24/2021  6:21 AM      Passed - Valid encounter within last 12 months    Recent Outpatient Visits          2 weeks ago Essential (primary) hypertension   [facility], [name], MD   3 months ago Fall, initial encounter   [facility], [name], MD   4 months ago Essential (primary) hypertension   [facility], [name], MD   7 months ago Acute on chronic heart failure with preserved ejection fraction [facility])   [facility], [name], MD   8 months ago Gait instability   [facility] [name], MD      Future Appointments            In 5 months Bacigalupo, [name], MD [facility], [facility]            lisinopril (ZESTRIL) 5 MG tablet [Pharmacy Med Name: LISINOPRIL 5MG  TABLETS] 90 tablet 1    Sig: TAKE 1 TABLET(5 MG) BY MOUTH DAILY     Cardiovascular:  ACE Inhibitors Failed - 12/24/2021  6:21 AM      Failed - Cr in normal range and within 180 days    Creatinine  Date Value Ref Range Status  06/21/2014 0.83 0.60 - 1.30 mg/dL Final   Creatinine, Ser  Date Value Ref Range Status  12/06/2021 1.36 (H) 0.57 - 1.00 mg/dL Final         Passed - K in normal range and within 180 days    Potassium  Date Value Ref Range Status  12/06/2021 4.6 3.5 - 5.2 mmol/L Final  06/21/2014 3.7 3.5 - 5.1 mmol/L Final         Passed - Patient is not pregnant      Passed - Last BP in normal range    BP Readings from Last 1 Encounters:  12/06/21 (!) 120/58         Passed - Valid encounter within last 6 months    Recent Outpatient Visits          2 weeks ago Essential (primary) hypertension   [employer] [facility], [name], MD   3 months ago Fall, initial encounter   [facility], [name], MD   4 months ago Essential (primary) hypertension   [facility], [name], MD   7 months ago Acute on chronic heart failure with preserved ejection fraction [facility])   [facility], [name]   8 months ago Gait instability   [facility] [name], MD      Future Appointments            In 5 months Bacigalupo, [name], MD [facility], [facility]            gabapentin (NEURONTIN) 100 MG capsule [Pharmacy Med Name: GABAPENTIN 100MG  CAPSULES] 360 capsule 1    Sig: TAKE 2 CAPSULES BY MOUTH TWICE DAILY     Neurology: Anticonvulsants - gabapentin Failed - 12/24/2021  6:21 AM      Failed - Cr in normal range and within 360 days    Creatinine  Date Value Ref Range Status  06/21/2014 0.83 0.60 - 1.30 mg/dL Final   Creatinine, Ser  Date Value Ref Range Status  12/06/2021 1.36 (H) 0.57 - 1.00 mg/dL Final         Passed - Completed PHQ-2 or PHQ-9 in the last 360 days      Passed - Valid encounter within last 12 months    Recent Outpatient Visits          2 weeks ago Essential (primary) hypertension   [facility], [name], MD   3 months ago Fall, initial encounter   [facility], [name]   4 months ago Essential (primary) hypertension   [facility] [place], [name], MD   7 months ago Acute on chronic heart failure with preserved ejection fraction [facility])   [facility], [name], MD   8 months ago Gait instability   [facility] [name], MD      Future Appointments            In 5 months Bacigalupo, [name], MD [facility], [place]

## 2021-12-27 DIAGNOSIS — L89322 Pressure ulcer of left buttock, stage 2: Secondary | ICD-10-CM | POA: Diagnosis not present

## 2021-12-27 DIAGNOSIS — D649 Anemia, unspecified: Secondary | ICD-10-CM | POA: Diagnosis not present

## 2021-12-27 DIAGNOSIS — I700 Atherosclerosis of aorta: Secondary | ICD-10-CM | POA: Diagnosis not present

## 2021-12-27 DIAGNOSIS — I5030 Unspecified diastolic (congestive) heart failure: Secondary | ICD-10-CM | POA: Diagnosis not present

## 2021-12-27 DIAGNOSIS — E039 Hypothyroidism, unspecified: Secondary | ICD-10-CM | POA: Diagnosis not present

## 2021-12-27 DIAGNOSIS — I110 Hypertensive heart disease with heart failure: Secondary | ICD-10-CM | POA: Diagnosis not present

## 2021-12-30 ENCOUNTER — Other Ambulatory Visit: Payer: Self-pay | Admitting: Family Medicine

## 2021-12-30 DIAGNOSIS — Z1231 Encounter for screening mammogram for malignant neoplasm of breast: Secondary | ICD-10-CM

## 2021-12-31 ENCOUNTER — Telehealth: Payer: Self-pay | Admitting: Family Medicine

## 2021-12-31 DIAGNOSIS — I110 Hypertensive heart disease with heart failure: Secondary | ICD-10-CM | POA: Diagnosis not present

## 2021-12-31 DIAGNOSIS — I700 Atherosclerosis of aorta: Secondary | ICD-10-CM | POA: Diagnosis not present

## 2021-12-31 DIAGNOSIS — I5030 Unspecified diastolic (congestive) heart failure: Secondary | ICD-10-CM | POA: Diagnosis not present

## 2021-12-31 DIAGNOSIS — D649 Anemia, unspecified: Secondary | ICD-10-CM | POA: Diagnosis not present

## 2021-12-31 DIAGNOSIS — L89322 Pressure ulcer of left buttock, stage 2: Secondary | ICD-10-CM | POA: Diagnosis not present

## 2021-12-31 DIAGNOSIS — E039 Hypothyroidism, unspecified: Secondary | ICD-10-CM | POA: Diagnosis not present

## 2021-12-31 NOTE — Telephone Encounter (Signed)
Copied from [place] [phone]. Topic: Quick Communication - Home Health Verbal Orders >> Dec 31, 2021 11:16 AM Yvette Rack wrote: Caller/Agency: Maeola Harman with [facility] Number: [phone] Requesting OT/PT/Skilled Nursing/Social Work/Speech Therapy: continue skilled nursing for wound care (skin breakdown on left and right side of buttocks) Frequency: 2 times a week for 4 weeks Judeen Hammans will fax request for seat cushion for pt chair to relieve pressure

## 2022-01-01 NOTE — Telephone Encounter (Signed)
Left message for Helen Ramsey advising of approved orders.

## 2022-01-02 DIAGNOSIS — L89322 Pressure ulcer of left buttock, stage 2: Secondary | ICD-10-CM | POA: Diagnosis not present

## 2022-01-02 DIAGNOSIS — I5030 Unspecified diastolic (congestive) heart failure: Secondary | ICD-10-CM | POA: Diagnosis not present

## 2022-01-02 DIAGNOSIS — E039 Hypothyroidism, unspecified: Secondary | ICD-10-CM | POA: Diagnosis not present

## 2022-01-02 DIAGNOSIS — D649 Anemia, unspecified: Secondary | ICD-10-CM | POA: Diagnosis not present

## 2022-01-02 DIAGNOSIS — I110 Hypertensive heart disease with heart failure: Secondary | ICD-10-CM | POA: Diagnosis not present

## 2022-01-02 DIAGNOSIS — I700 Atherosclerosis of aorta: Secondary | ICD-10-CM | POA: Diagnosis not present

## 2022-01-03 DIAGNOSIS — I5030 Unspecified diastolic (congestive) heart failure: Secondary | ICD-10-CM | POA: Diagnosis not present

## 2022-01-03 DIAGNOSIS — I110 Hypertensive heart disease with heart failure: Secondary | ICD-10-CM | POA: Diagnosis not present

## 2022-01-03 DIAGNOSIS — I700 Atherosclerosis of aorta: Secondary | ICD-10-CM | POA: Diagnosis not present

## 2022-01-03 DIAGNOSIS — E039 Hypothyroidism, unspecified: Secondary | ICD-10-CM | POA: Diagnosis not present

## 2022-01-03 DIAGNOSIS — L89322 Pressure ulcer of left buttock, stage 2: Secondary | ICD-10-CM | POA: Diagnosis not present

## 2022-01-03 DIAGNOSIS — D649 Anemia, unspecified: Secondary | ICD-10-CM | POA: Diagnosis not present

## 2022-01-07 DIAGNOSIS — Z6832 Body mass index (BMI) 32.0-32.9, adult: Secondary | ICD-10-CM | POA: Diagnosis not present

## 2022-01-07 DIAGNOSIS — I700 Atherosclerosis of aorta: Secondary | ICD-10-CM | POA: Diagnosis not present

## 2022-01-07 DIAGNOSIS — E782 Mixed hyperlipidemia: Secondary | ICD-10-CM | POA: Diagnosis not present

## 2022-01-07 DIAGNOSIS — Z7951 Long term (current) use of inhaled steroids: Secondary | ICD-10-CM | POA: Diagnosis not present

## 2022-01-07 DIAGNOSIS — I5030 Unspecified diastolic (congestive) heart failure: Secondary | ICD-10-CM | POA: Diagnosis not present

## 2022-01-07 DIAGNOSIS — D649 Anemia, unspecified: Secondary | ICD-10-CM | POA: Diagnosis not present

## 2022-01-07 DIAGNOSIS — Z7982 Long term (current) use of aspirin: Secondary | ICD-10-CM | POA: Diagnosis not present

## 2022-01-07 DIAGNOSIS — L89322 Pressure ulcer of left buttock, stage 2: Secondary | ICD-10-CM | POA: Diagnosis not present

## 2022-01-07 DIAGNOSIS — F3341 Major depressive disorder, recurrent, in partial remission: Secondary | ICD-10-CM | POA: Diagnosis not present

## 2022-01-07 DIAGNOSIS — I110 Hypertensive heart disease with heart failure: Secondary | ICD-10-CM | POA: Diagnosis not present

## 2022-01-07 DIAGNOSIS — E039 Hypothyroidism, unspecified: Secondary | ICD-10-CM | POA: Diagnosis not present

## 2022-01-08 ENCOUNTER — Other Ambulatory Visit: Payer: Self-pay | Admitting: Family Medicine

## 2022-01-08 ENCOUNTER — Telehealth: Payer: Self-pay | Admitting: Family Medicine

## 2022-01-08 DIAGNOSIS — E039 Hypothyroidism, unspecified: Secondary | ICD-10-CM | POA: Diagnosis not present

## 2022-01-08 DIAGNOSIS — I700 Atherosclerosis of aorta: Secondary | ICD-10-CM | POA: Diagnosis not present

## 2022-01-08 DIAGNOSIS — I110 Hypertensive heart disease with heart failure: Secondary | ICD-10-CM | POA: Diagnosis not present

## 2022-01-08 DIAGNOSIS — D649 Anemia, unspecified: Secondary | ICD-10-CM | POA: Diagnosis not present

## 2022-01-08 DIAGNOSIS — R0602 Shortness of breath: Secondary | ICD-10-CM

## 2022-01-08 DIAGNOSIS — L89322 Pressure ulcer of left buttock, stage 2: Secondary | ICD-10-CM | POA: Diagnosis not present

## 2022-01-08 DIAGNOSIS — R059 Cough, unspecified: Secondary | ICD-10-CM

## 2022-01-08 DIAGNOSIS — F411 Generalized anxiety disorder: Secondary | ICD-10-CM

## 2022-01-08 DIAGNOSIS — I5030 Unspecified diastolic (congestive) heart failure: Secondary | ICD-10-CM | POA: Diagnosis not present

## 2022-01-08 NOTE — Telephone Encounter (Signed)
Medication Refill - Medication: lisinopril (ZESTRIL) 5 MG tablet,citalopram (CELEXA) 40 MG tablet  [name], stated pt only has medication for the rest of this week.   Has the patient contacted their pharmacy? Yes.   Advised to contact PCP.   (Agent: If yes, when and what did the pharmacy advise?)  Preferred Pharmacy (with phone number or street name):  [facility] #[id number] [address], [place]  [place] [address]  Phone: [phone] Fax: [phone]  Hours: Not open 24 hours   Has the patient been seen for an appointment in the last year OR does the patient have an upcoming appointment? Yes.    Agent: Please be advised that RX refills may take up to 3 business days. We ask that you follow-up with your pharmacy.

## 2022-01-08 NOTE — Telephone Encounter (Signed)
Requested medication (s) are due for refill today:   yes  Requested medication (s) are on the active medication list:   Yes  Future visit scheduled:   Yes   Last ordered: 07/15/2021 #180, 1 refill  Returned because there isn't a protocol assigned to this medication.   Requested Prescriptions  Pending Prescriptions Disp Refills   BREO ELLIPTA 100-25 MCG/ACT AEPB [Pharmacy Med Name: BREO ELLIPTA 100-25MCG ORAL INH(30)] 180 each 1    Sig: INHALE 1 PUFF INTO THE LUNGS DAILY     There is no refill protocol information for this order

## 2022-01-09 MED ORDER — CITALOPRAM HYDROBROMIDE 40 MG PO TABS: 40.0000 mg | ORAL_TABLET | Freq: Every day | ORAL | 1 refills | Status: DC

## 2022-01-09 NOTE — Telephone Encounter (Signed)
Requested Prescriptions  Pending Prescriptions Disp Refills   citalopram (CELEXA) 40 MG tablet 90 tablet 1    Sig: Take 1 tablet (40 mg total) by mouth daily.     Psychiatry:  Antidepressants - SSRI Passed - 01/08/2022  2:11 PM      Passed - Completed PHQ-2 or PHQ-9 in the last 360 days      Passed - Valid encounter within last 6 months    Recent Outpatient Visits          1 month ago Essential (primary) hypertension   [facility] [place], [name], MD   3 months ago Fall, initial encounter   [facility], [name], MD   4 months ago Essential (primary) hypertension   [facility], [name], MD   7 months ago Acute on chronic heart failure with preserved ejection fraction [facility])   [facility], [name], MD   9 months ago Gait instability   [facility] [name], MD      Future Appointments            In 4 months Bacigalupo, [name], MD [facility], [place]

## 2022-01-10 DIAGNOSIS — I700 Atherosclerosis of aorta: Secondary | ICD-10-CM | POA: Diagnosis not present

## 2022-01-10 DIAGNOSIS — I110 Hypertensive heart disease with heart failure: Secondary | ICD-10-CM | POA: Diagnosis not present

## 2022-01-10 DIAGNOSIS — E039 Hypothyroidism, unspecified: Secondary | ICD-10-CM | POA: Diagnosis not present

## 2022-01-10 DIAGNOSIS — L89322 Pressure ulcer of left buttock, stage 2: Secondary | ICD-10-CM | POA: Diagnosis not present

## 2022-01-10 DIAGNOSIS — I5030 Unspecified diastolic (congestive) heart failure: Secondary | ICD-10-CM | POA: Diagnosis not present

## 2022-01-10 DIAGNOSIS — D649 Anemia, unspecified: Secondary | ICD-10-CM | POA: Diagnosis not present

## 2022-01-13 ENCOUNTER — Telehealth: Payer: Self-pay | Admitting: Family Medicine

## 2022-01-13 DIAGNOSIS — D649 Anemia, unspecified: Secondary | ICD-10-CM | POA: Diagnosis not present

## 2022-01-13 DIAGNOSIS — L89322 Pressure ulcer of left buttock, stage 2: Secondary | ICD-10-CM | POA: Diagnosis not present

## 2022-01-13 DIAGNOSIS — I5030 Unspecified diastolic (congestive) heart failure: Secondary | ICD-10-CM | POA: Diagnosis not present

## 2022-01-13 DIAGNOSIS — I700 Atherosclerosis of aorta: Secondary | ICD-10-CM | POA: Diagnosis not present

## 2022-01-13 DIAGNOSIS — I110 Hypertensive heart disease with heart failure: Secondary | ICD-10-CM | POA: Diagnosis not present

## 2022-01-13 DIAGNOSIS — E039 Hypothyroidism, unspecified: Secondary | ICD-10-CM | POA: Diagnosis not present

## 2022-01-13 MED ORDER — LISINOPRIL 5 MG PO TABS: 5.0000 mg | ORAL_TABLET | Freq: Every day | ORAL | 1 refills | Status: DC

## 2022-01-13 NOTE — Telephone Encounter (Signed)
Medication Refill - Medication: lisinopril (ZESTRIL) 5 MG tablet  Pt runs out today, requested this for over a week with both the office and the pharmacy   Has the patient contacted their pharmacy? Yes.   (Agent: If no, request that the patient contact the pharmacy for the refill. If patient does not wish to contact the pharmacy document the reason why and proceed with request.) (Agent: If yes, when and what did the pharmacy advise?)  Preferred Pharmacy (with phone number or street name):  [facility] #[id number] [address] Rabon, [place] - [place]  [place] [address]  Phone: [phone] Fax: [phone]   Has the patient been seen for an appointment in the last year OR does the patient have an upcoming appointment? Yes.    Agent: Please be advised that RX refills may take up to 3 business days. We ask that you follow-up with your pharmacy.

## 2022-01-13 NOTE — Telephone Encounter (Signed)
Already reordered today by provider.

## 2022-01-14 ENCOUNTER — Other Ambulatory Visit: Payer: Self-pay | Admitting: Family Medicine

## 2022-01-14 DIAGNOSIS — L89322 Pressure ulcer of left buttock, stage 2: Secondary | ICD-10-CM | POA: Diagnosis not present

## 2022-01-14 DIAGNOSIS — M542 Cervicalgia: Secondary | ICD-10-CM | POA: Diagnosis not present

## 2022-01-14 DIAGNOSIS — I5030 Unspecified diastolic (congestive) heart failure: Secondary | ICD-10-CM | POA: Diagnosis not present

## 2022-01-14 DIAGNOSIS — I110 Hypertensive heart disease with heart failure: Secondary | ICD-10-CM | POA: Diagnosis not present

## 2022-01-14 DIAGNOSIS — I700 Atherosclerosis of aorta: Secondary | ICD-10-CM | POA: Diagnosis not present

## 2022-01-14 DIAGNOSIS — G629 Polyneuropathy, unspecified: Secondary | ICD-10-CM

## 2022-01-14 DIAGNOSIS — E039 Hypothyroidism, unspecified: Secondary | ICD-10-CM | POA: Diagnosis not present

## 2022-01-14 DIAGNOSIS — M5481 Occipital neuralgia: Secondary | ICD-10-CM | POA: Diagnosis not present

## 2022-01-14 DIAGNOSIS — D649 Anemia, unspecified: Secondary | ICD-10-CM | POA: Diagnosis not present

## 2022-01-14 NOTE — Telephone Encounter (Signed)
Rx 08/15/21 #90 1RF- 6 month supply-too soon Requested Prescriptions  Pending Prescriptions Disp Refills   gabapentin (NEURONTIN) 100 MG capsule [Pharmacy Med Name: GABAPENTIN 100MG  CAPSULES] 360 capsule 1    Sig: TAKE 2 CAPSULES BY MOUTH TWICE DAILY     Neurology: Anticonvulsants - gabapentin Failed - 01/14/2022  9:24 AM      Failed - Cr in normal range and within 360 days    Creatinine  Date Value Ref Range Status  06/21/2014 0.83 0.60 - 1.30 mg/dL Final   Creatinine, Ser  Date Value Ref Range Status  12/06/2021 1.36 (H) 0.57 - 1.00 mg/dL Final         Passed - Completed PHQ-2 or PHQ-9 in the last 360 days      Passed - Valid encounter within last 12 months    Recent Outpatient Visits          1 month ago Essential (primary) hypertension   [facility], [name], MD   4 months ago Fall, initial encounter   [facility], [name], MD   5 months ago Essential (primary) hypertension   [facility] [place], [name], MD   8 months ago Acute on chronic heart failure with preserved ejection fraction [facility])   [facility], [name], MD   9 months ago Gait instability   [facility] [name], MD      Future Appointments            In 4 months Bacigalupo, [name], MD [facility] [place]

## 2022-01-17 DIAGNOSIS — L89322 Pressure ulcer of left buttock, stage 2: Secondary | ICD-10-CM | POA: Diagnosis not present

## 2022-01-17 DIAGNOSIS — I110 Hypertensive heart disease with heart failure: Secondary | ICD-10-CM | POA: Diagnosis not present

## 2022-01-17 DIAGNOSIS — I700 Atherosclerosis of aorta: Secondary | ICD-10-CM | POA: Diagnosis not present

## 2022-01-17 DIAGNOSIS — I5030 Unspecified diastolic (congestive) heart failure: Secondary | ICD-10-CM | POA: Diagnosis not present

## 2022-01-17 DIAGNOSIS — D649 Anemia, unspecified: Secondary | ICD-10-CM | POA: Diagnosis not present

## 2022-01-17 DIAGNOSIS — E039 Hypothyroidism, unspecified: Secondary | ICD-10-CM | POA: Diagnosis not present

## 2022-01-20 ENCOUNTER — Other Ambulatory Visit: Payer: Self-pay | Admitting: Family Medicine

## 2022-01-20 DIAGNOSIS — G629 Polyneuropathy, unspecified: Secondary | ICD-10-CM

## 2022-01-20 DIAGNOSIS — K219 Gastro-esophageal reflux disease without esophagitis: Secondary | ICD-10-CM

## 2022-01-20 DIAGNOSIS — R768 Other specified abnormal immunological findings in serum: Secondary | ICD-10-CM | POA: Diagnosis not present

## 2022-01-20 DIAGNOSIS — L940 Localized scleroderma [morphea]: Secondary | ICD-10-CM | POA: Diagnosis not present

## 2022-01-20 DIAGNOSIS — R778 Other specified abnormalities of plasma proteins: Secondary | ICD-10-CM | POA: Diagnosis not present

## 2022-01-20 NOTE — Telephone Encounter (Signed)
Copied from [place] [phone]. Topic: Quick Communication - Rx Refill/Question ?>> Jan 20, 2022  2:14 PM Lenon Curt, Everette A wrote: ?Medication: gabapentin (NEURONTIN) 100 MG capsule [[id number]]  ? ?omeprazole (PRILOSEC) 20 MG capsule [[id number]]  ? ?gabapentin (NEURONTIN) 300 MG capsule  ? ?Has the patient contacted their pharmacy? Yes.   ?(Agent: If no, request that the patient contact the pharmacy for the refill. If patient does not wish to contact the pharmacy document the reason why and proceed with request.) ?(Agent: If yes, when and what did the pharmacy advise?) ? ?Preferred Pharmacy (with phone number or street name): [id number], [place] ?[place] [address] ?Phone: [phone] Fax: [phone] ?Hours: Not open 24 hours ? ? ?Has the patient been seen for an appointment in the last year OR does the patient have an upcoming appointment? Yes.   ? ?Agent: Please be advised that RX refills may take up to 3 business days. We ask that you follow-up with your pharmacy. ?

## 2022-01-21 DIAGNOSIS — I110 Hypertensive heart disease with heart failure: Secondary | ICD-10-CM | POA: Diagnosis not present

## 2022-01-21 DIAGNOSIS — E039 Hypothyroidism, unspecified: Secondary | ICD-10-CM | POA: Diagnosis not present

## 2022-01-21 DIAGNOSIS — D649 Anemia, unspecified: Secondary | ICD-10-CM | POA: Diagnosis not present

## 2022-01-21 DIAGNOSIS — I5030 Unspecified diastolic (congestive) heart failure: Secondary | ICD-10-CM | POA: Diagnosis not present

## 2022-01-21 DIAGNOSIS — L89322 Pressure ulcer of left buttock, stage 2: Secondary | ICD-10-CM | POA: Diagnosis not present

## 2022-01-21 DIAGNOSIS — I700 Atherosclerosis of aorta: Secondary | ICD-10-CM | POA: Diagnosis not present

## 2022-01-21 MED ORDER — OMEPRAZOLE 20 MG PO CPDR: DELAYED_RELEASE_CAPSULE | ORAL | 1 refills | Status: DC

## 2022-01-21 MED ORDER — GABAPENTIN 300 MG PO CAPS: 300.0000 mg | ORAL_CAPSULE | Freq: Every evening | ORAL | 1 refills | Status: DC

## 2022-01-21 NOTE — Telephone Encounter (Signed)
Requested medication (s) are due for refill today: yes ? ?Requested medication (s) are on the active medication list: yes ? ?Last refill:  08/15/21 #360 with 1 RF ? ?Future visit scheduled: 06/05/22 ? ?Notes to clinic:  The instructions are saying 200 mg but in parenthesis it is saying one tablet. The med is '100mg'$  so am sending back to see if you want to update the wording in the instructions. Please assess. ? ? ?  ? ?Requested Prescriptions  ?Pending Prescriptions Disp Refills  ? gabapentin (NEURONTIN) 100 MG capsule 360 capsule 1  ?  Sig: Take 2 capsules (200 mg total) by mouth 2 (two) times daily. Start Gabapentin 200 mg (one tablet) in the morning, 200 mg in the afternoon (one tablet), and 300 mg in the evening.  ?  ? Neurology: Anticonvulsants - gabapentin Failed - 01/20/2022  2:49 PM  ?  ?  Failed - Cr in normal range and within 360 days  ?  Creatinine  ?Date Value Ref Range Status  ?06/21/2014 0.83 0.60 - 1.30 mg/dL Final  ? ?Creatinine, Ser  ?Date Value Ref Range Status  ?12/06/2021 1.36 (H) 0.57 - 1.00 mg/dL Final  ?  ?  ?  ?  Passed - Completed PHQ-2 or PHQ-9 in the last 360 days  ?  ?  Passed - Valid encounter within last 12 months  ?  Recent Outpatient Visits   ? ?      ? 1 month ago Essential (primary) hypertension  ? [facility] [place], [name], MD  ? 4 months ago Fall, initial encounter  ? [facility] [place], [name], MD  ? 5 months ago Essential (primary) hypertension  ? [facility], [name], MD  ? 8 months ago Acute on chronic heart failure with preserved ejection fraction [facility])  ? [facility] [place], [name], MD  ? 9 months ago Gait instability  ? [facility] [name], [facility]  ? ?  ?  ?Future Appointments   ? ?        ? In 4 months Bacigalupo, [name], MD [facility]  ? ?  ? ?  ?  ?  ?Signed Prescriptions Disp Refills  ? gabapentin (NEURONTIN) 300 MG capsule 90 capsule 1   ?  Sig: Take 1 capsule (300 mg total) by mouth at bedtime.  ?  ? Neurology: Anticonvulsants - gabapentin Failed - 01/20/2022  2:49 PM  ?  ?  Failed - Cr in normal range and within 360 days  ?  Creatinine  ?Date Value Ref Range Status  ?06/21/2014 0.83 0.60 - 1.30 mg/dL Final  ? ?Creatinine, Ser  ?Date Value Ref Range Status  ?12/06/2021 1.36 (H) 0.57 - 1.00 mg/dL Final  ?  ?  ?  ?  Passed - Completed PHQ-2 or PHQ-9 in the last 360 days  ?  ?  Passed - Valid encounter within last 12 months  ?  Recent Outpatient Visits   ? ?      ? 1 month ago Essential (primary) hypertension  ? [facility] [place], [name], MD  ? 4 months ago Fall, initial encounter  ? [facility] [place], [name], MD  ? 5 months ago Essential (primary) hypertension  ? [facility] [place], [name], MD  ? 8 months ago Acute on chronic heart failure with preserved ejection fraction [facility])  ? [facility] Bacigalupo, [name], MD  ?  9 months ago Gait instability  ? [facility] [name], MD  ? ?  ?  ?Future Appointments   ? ?        ? In 4 months Bacigalupo, [name], MD [facility]  ? ?  ? ?  ?  ?  ? omeprazole (PRILOSEC) 20 MG capsule 90 capsule 1  ?  Sig: TAKE 1 CAPSULE(20 MG) BY MOUTH DAILY  ?  ? Gastroenterology: Proton Pump Inhibitors Passed - 01/20/2022  2:49 PM  ?  ?  Passed - Valid encounter within last 12 months  ?  Recent Outpatient Visits   ? ?      ? 1 month ago Essential (primary) hypertension  ? [facility] [place], [name], MD  ? 4 months ago Fall, initial encounter  ? [facility] [place], [name], MD  ? 5 months ago Essential (primary) hypertension  ? [facility] [place], [name], MD  ? 8 months ago Acute on chronic heart failure with preserved ejection fraction [facility])  ? [facility] [place], [name], MD  ? 9 months ago Gait instability  ? [facility] [name], MD  ? ?  ?  ?Future Appointments   ? ?        ? In 4 months Bacigalupo, [name], MD [facility]  ? ?  ? ?  ?  ?  ? ? ?

## 2022-01-21 NOTE — Telephone Encounter (Signed)
Requested Prescriptions  ?Pending Prescriptions Disp Refills  ?? gabapentin (NEURONTIN) 100 MG capsule 360 capsule 1  ?  Sig: Take 2 capsules (200 mg total) by mouth 2 (two) times daily. Start Gabapentin 200 mg (one tablet) in the morning, 200 mg in the afternoon (one tablet), and 300 mg in the evening.  ?  ? Neurology: Anticonvulsants - gabapentin Failed - 01/20/2022  2:49 PM  ?  ?  Failed - Cr in normal range and within 360 days  ?  Creatinine  ?Date Value Ref Range Status  ?06/21/2014 0.83 0.60 - 1.30 mg/dL Final  ? ?Creatinine, Ser  ?Date Value Ref Range Status  ?12/06/2021 1.36 (H) 0.57 - 1.00 mg/dL Final  ?   ?  ?  Passed - Completed PHQ-2 or PHQ-9 in the last 360 days  ?  ?  Passed - Valid encounter within last 12 months  ?  Recent Outpatient Visits   ?      ? 1 month ago Essential (primary) hypertension  ? [facility] [place], [name], MD  ? 4 months ago Fall, initial encounter  ? [facility] [place], [name], MD  ? 5 months ago Essential (primary) hypertension  ? [facility] [place], [name], MD  ? 8 months ago Acute on chronic heart failure with preserved ejection fraction [facility])  ? [facility], [name], MD  ? 9 months ago Gait instability  ? [facility] [name], MD  ?  ?  ?Future Appointments   ?        ? In 4 months Bacigalupo, [name], MD [facility]  ?  ? ?  ?  ?  ?? gabapentin (NEURONTIN) 300 MG capsule 90 capsule 1  ?  Sig: Take 1 capsule (300 mg total) by mouth at bedtime.  ?  ? Neurology: Anticonvulsants - gabapentin Failed - 01/20/2022  2:49 PM  ?  ?  Failed - Cr in normal range and within 360 days  ?  Creatinine  ?Date Value Ref Range Status  ?06/21/2014 0.83 0.60 - 1.30 mg/dL Final  ? ?Creatinine, Ser  ?Date Value Ref Range Status  ?12/06/2021 1.36 (H) 0.57 - 1.00 mg/dL Final  ?   ?  ?  Passed - Completed PHQ-2 or PHQ-9 in the last 360 days  ?  ?  Passed - Valid  encounter within last 12 months  ?  Recent Outpatient Visits   ?      ? 1 month ago Essential (primary) hypertension  ? [facility] [place], [name], MD  ? 4 months ago Fall, initial encounter  ? [facility] [place], [name], MD  ? 5 months ago Essential (primary) hypertension  ? [facility] [place], [name], MD  ? 8 months ago Acute on chronic heart failure with preserved ejection fraction [facility])  ? [facility] [place], [name], MD  ? 9 months ago Gait instability  ? [facility] [name], MD  ?  ?  ?Future Appointments   ?        ? In 4 months Bacigalupo, [name], MD [facility]  ?  ? ?  ?  ?  ?? omeprazole (PRILOSEC) 20 MG capsule 90 capsule 1  ?  Sig: TAKE 1 CAPSULE(20 MG) BY MOUTH DAILY  ?  ? Gastroenterology: Proton Pump Inhibitors Passed - 01/20/2022  2:49 PM  ?  ?  Passed - Valid encounter within last 12 months  ?  Recent Outpatient Visits   ?      ? 1 month ago Essential (primary) hypertension  ? [facility] [place], [name], MD  ? 4 months ago Fall, initial encounter  ? [facility] [place], [name], MD  ? 5 months ago Essential (primary) hypertension  ? [facility] [place], [name], MD  ? 8 months ago Acute on chronic heart failure with preserved ejection fraction [facility])  ? [facility], [name], MD  ? 9 months ago Gait instability  ? [facility] [name], MD  ?  ?  ?Future Appointments   ?        ? In 4 months Bacigalupo, [name], MD [facility]  ?  ? ?  ?  ?  ? ? ?

## 2022-01-23 ENCOUNTER — Telehealth: Payer: Self-pay | Admitting: *Deleted

## 2022-01-23 DIAGNOSIS — E039 Hypothyroidism, unspecified: Secondary | ICD-10-CM | POA: Diagnosis not present

## 2022-01-23 DIAGNOSIS — I700 Atherosclerosis of aorta: Secondary | ICD-10-CM | POA: Diagnosis not present

## 2022-01-23 DIAGNOSIS — I110 Hypertensive heart disease with heart failure: Secondary | ICD-10-CM | POA: Diagnosis not present

## 2022-01-23 DIAGNOSIS — I5030 Unspecified diastolic (congestive) heart failure: Secondary | ICD-10-CM | POA: Diagnosis not present

## 2022-01-23 DIAGNOSIS — L89322 Pressure ulcer of left buttock, stage 2: Secondary | ICD-10-CM | POA: Diagnosis not present

## 2022-01-23 DIAGNOSIS — D649 Anemia, unspecified: Secondary | ICD-10-CM | POA: Diagnosis not present

## 2022-01-23 MED ORDER — GABAPENTIN 100 MG PO CAPS: 200.0000 mg | ORAL_CAPSULE | Freq: Two times a day (BID) | ORAL | 1 refills | Status: DC

## 2022-01-23 NOTE — Chronic Care Management (AMB) (Signed)
?  Chronic Care Management  ? ?Note ? ?01/23/2022 ?Name: Helen Ramsey MRN: [id number] DOB: 02/10/1933 ? ?Helen Ramsey is a 88 y.o. year old female who is a primary care patient of Bacigalupo, Marzella Schlein, MD. I reached out to Stacey Drain by phone today in response to a referral sent by Helen Ramsey's PCP. ? ?Helen Ramsey was given information about Chronic Care Management services today including:  ?CCM service includes personalized support from designated clinical staff supervised by her physician, including individualized plan of care and coordination with other care providers ?24/7 contact phone numbers for assistance for urgent and routine care needs. ?Service will only be billed when office clinical staff spend 20 minutes or more in a month to coordinate care. ?Only one practitioner may furnish and bill the service in a calendar month. ?The patient may stop CCM services at any time (effective at the end of the month) by phone call to the office staff. ?The patient is responsible for co-pay (up to 20% after annual deductible is met) if co-pay is required by the individual health plan.  ? ?Patient agreed to services and verbal consent obtained.  ? ?Follow up plan: ?Telephone appointment with care management team member scheduled for: 02/14/2022 ? ?Helen Ramsey, CCMA ?Care Guide, Embedded Care Coordination ?[place]  Care Management  ?Direct Dial: [phone] ? ? ?

## 2022-01-24 ENCOUNTER — Other Ambulatory Visit: Payer: Self-pay | Admitting: Family Medicine

## 2022-01-24 DIAGNOSIS — I110 Hypertensive heart disease with heart failure: Secondary | ICD-10-CM | POA: Diagnosis not present

## 2022-01-24 DIAGNOSIS — E039 Hypothyroidism, unspecified: Secondary | ICD-10-CM | POA: Diagnosis not present

## 2022-01-24 DIAGNOSIS — L89322 Pressure ulcer of left buttock, stage 2: Secondary | ICD-10-CM | POA: Diagnosis not present

## 2022-01-24 DIAGNOSIS — I5030 Unspecified diastolic (congestive) heart failure: Secondary | ICD-10-CM | POA: Diagnosis not present

## 2022-01-24 DIAGNOSIS — D649 Anemia, unspecified: Secondary | ICD-10-CM | POA: Diagnosis not present

## 2022-01-24 DIAGNOSIS — I700 Atherosclerosis of aorta: Secondary | ICD-10-CM | POA: Diagnosis not present

## 2022-01-24 NOTE — Telephone Encounter (Signed)
Requested Prescriptions  ?Pending Prescriptions Disp Refills  ?? levothyroxine (SYNTHROID) 50 MCG tablet [Pharmacy Med Name: LEVOTHYROXINE 0.'05MG'$  (50MCG) TAB] 90 tablet 1  ?  Sig: TAKE 1 TABLET(50 MCG) BY MOUTH DAILY BEFORE BREAKFAST  ?  ? Endocrinology:  Hypothyroid Agents Passed - 01/24/2022  3:25 PM  ?  ?  Passed - TSH in normal range and within 360 days  ?  TSH  ?Date Value Ref Range Status  ?12/06/2021 3.350 0.450 - 4.500 uIU/mL Final  ?   ?  ?  Passed - Valid encounter within last 12 months  ?  Recent Outpatient Visits   ?      ? 1 month ago Essential (primary) hypertension  ? [facility] [place], [name], MD  ? 4 months ago Fall, initial encounter  ? [facility] [place], [name], MD  ? 5 months ago Essential (primary) hypertension  ? [facility] [name], [name], MD  ? 8 months ago Acute on chronic heart failure with preserved ejection fraction [facility])  ? [facility] [place], [name], MD  ? 9 months ago Gait instability  ? [facility] [name], MD  ?  ?  ?Future Appointments   ?        ? In 4 months Bacigalupo, [name], MD [facility]  ?  ? ?  ?  ?  ? ?

## 2022-01-27 ENCOUNTER — Ambulatory Visit
Admission: RE | Admit: 2022-01-27 | Discharge: 2022-01-27 | Disposition: A | Discharge status: Home / Self Care | Payer: Medicare Other | Source: Ambulatory Visit | Attending: Family Medicine | Admitting: Family Medicine

## 2022-01-27 ENCOUNTER — Other Ambulatory Visit: Payer: Self-pay

## 2022-01-27 DIAGNOSIS — Z1231 Encounter for screening mammogram for malignant neoplasm of breast: Secondary | ICD-10-CM | POA: Diagnosis not present

## 2022-01-28 DIAGNOSIS — D649 Anemia, unspecified: Secondary | ICD-10-CM | POA: Diagnosis not present

## 2022-01-28 DIAGNOSIS — I5030 Unspecified diastolic (congestive) heart failure: Secondary | ICD-10-CM | POA: Diagnosis not present

## 2022-01-28 DIAGNOSIS — I700 Atherosclerosis of aorta: Secondary | ICD-10-CM | POA: Diagnosis not present

## 2022-01-28 DIAGNOSIS — L89322 Pressure ulcer of left buttock, stage 2: Secondary | ICD-10-CM | POA: Diagnosis not present

## 2022-01-28 DIAGNOSIS — E039 Hypothyroidism, unspecified: Secondary | ICD-10-CM | POA: Diagnosis not present

## 2022-01-28 DIAGNOSIS — I110 Hypertensive heart disease with heart failure: Secondary | ICD-10-CM | POA: Diagnosis not present

## 2022-01-30 DIAGNOSIS — L89322 Pressure ulcer of left buttock, stage 2: Secondary | ICD-10-CM | POA: Diagnosis not present

## 2022-01-30 DIAGNOSIS — I700 Atherosclerosis of aorta: Secondary | ICD-10-CM | POA: Diagnosis not present

## 2022-01-30 DIAGNOSIS — E039 Hypothyroidism, unspecified: Secondary | ICD-10-CM | POA: Diagnosis not present

## 2022-01-30 DIAGNOSIS — I5030 Unspecified diastolic (congestive) heart failure: Secondary | ICD-10-CM | POA: Diagnosis not present

## 2022-01-30 DIAGNOSIS — D649 Anemia, unspecified: Secondary | ICD-10-CM | POA: Diagnosis not present

## 2022-01-30 DIAGNOSIS — I110 Hypertensive heart disease with heart failure: Secondary | ICD-10-CM | POA: Diagnosis not present

## 2022-01-31 ENCOUNTER — Encounter: Payer: Self-pay | Admitting: *Deleted

## 2022-01-31 DIAGNOSIS — L89322 Pressure ulcer of left buttock, stage 2: Secondary | ICD-10-CM | POA: Diagnosis not present

## 2022-01-31 DIAGNOSIS — I700 Atherosclerosis of aorta: Secondary | ICD-10-CM | POA: Diagnosis not present

## 2022-01-31 DIAGNOSIS — D649 Anemia, unspecified: Secondary | ICD-10-CM | POA: Diagnosis not present

## 2022-01-31 DIAGNOSIS — E039 Hypothyroidism, unspecified: Secondary | ICD-10-CM | POA: Diagnosis not present

## 2022-01-31 DIAGNOSIS — I5030 Unspecified diastolic (congestive) heart failure: Secondary | ICD-10-CM | POA: Diagnosis not present

## 2022-01-31 DIAGNOSIS — I110 Hypertensive heart disease with heart failure: Secondary | ICD-10-CM | POA: Diagnosis not present

## 2022-02-04 DIAGNOSIS — I5030 Unspecified diastolic (congestive) heart failure: Secondary | ICD-10-CM | POA: Diagnosis not present

## 2022-02-04 DIAGNOSIS — L89322 Pressure ulcer of left buttock, stage 2: Secondary | ICD-10-CM | POA: Diagnosis not present

## 2022-02-04 DIAGNOSIS — D649 Anemia, unspecified: Secondary | ICD-10-CM | POA: Diagnosis not present

## 2022-02-04 DIAGNOSIS — I110 Hypertensive heart disease with heart failure: Secondary | ICD-10-CM | POA: Diagnosis not present

## 2022-02-04 DIAGNOSIS — I700 Atherosclerosis of aorta: Secondary | ICD-10-CM | POA: Diagnosis not present

## 2022-02-04 DIAGNOSIS — E039 Hypothyroidism, unspecified: Secondary | ICD-10-CM | POA: Diagnosis not present

## 2022-02-07 ENCOUNTER — Other Ambulatory Visit: Payer: Self-pay

## 2022-02-07 ENCOUNTER — Ambulatory Visit: Payer: Self-pay

## 2022-02-07 ENCOUNTER — Inpatient Hospital Stay: Payer: Medicare Other

## 2022-02-07 ENCOUNTER — Inpatient Hospital Stay: Payer: Medicare Other | Attending: Oncology | Admitting: Oncology

## 2022-02-07 ENCOUNTER — Encounter: Payer: Self-pay | Admitting: Oncology

## 2022-02-07 VITALS — BP 108/63 | HR 73 | Temp 98.3°F | Resp 18 | Ht 58.75 in | Wt 152.7 lb

## 2022-02-07 DIAGNOSIS — D649 Anemia, unspecified: Secondary | ICD-10-CM | POA: Diagnosis not present

## 2022-02-07 DIAGNOSIS — R778 Other specified abnormalities of plasma proteins: Secondary | ICD-10-CM | POA: Insufficient documentation

## 2022-02-07 DIAGNOSIS — D472 Monoclonal gammopathy: Secondary | ICD-10-CM | POA: Diagnosis not present

## 2022-02-07 DIAGNOSIS — L940 Localized scleroderma [morphea]: Secondary | ICD-10-CM | POA: Insufficient documentation

## 2022-02-07 DIAGNOSIS — Z79899 Other long term (current) drug therapy: Secondary | ICD-10-CM | POA: Insufficient documentation

## 2022-02-07 LAB — IRON AND TIBC
Iron: 89 ug/dL (ref 28–170)
Saturation Ratios: 26 % (ref 10.4–31.8)
TIBC: 344 ug/dL (ref 250–450)
UIBC: 255 ug/dL

## 2022-02-07 LAB — CBC WITH DIFFERENTIAL/PLATELET
Abs Immature Granulocytes: 0.03 10*3/uL (ref 0.00–0.07)
Basophils Absolute: 0 10*3/uL (ref 0.0–0.1)
Basophils Relative: 1 %
Eosinophils Absolute: 0.5 10*3/uL (ref 0.0–0.5)
Eosinophils Relative: 8 %
HCT: 34.1 % — ABNORMAL LOW (ref 36.0–46.0)
Hemoglobin: 11.1 g/dL — ABNORMAL LOW (ref 12.0–15.0)
Immature Granulocytes: 1 %
Lymphocytes Relative: 21 %
Lymphs Abs: 1.4 10*3/uL (ref 0.7–4.0)
MCH: 30.7 pg (ref 26.0–34.0)
MCHC: 32.6 g/dL (ref 30.0–36.0)
MCV: 94.5 fL (ref 80.0–100.0)
Monocytes Absolute: 0.6 10*3/uL (ref 0.1–1.0)
Monocytes Relative: 10 %
Neutro Abs: 4 10*3/uL (ref 1.7–7.7)
Neutrophils Relative %: 59 %
Platelets: 170 10*3/uL (ref 150–400)
RBC: 3.61 MIL/uL — ABNORMAL LOW (ref 3.87–5.11)
RDW: 14.1 % (ref 11.5–15.5)
WBC: 6.5 10*3/uL (ref 4.0–10.5)
nRBC: 0 % (ref 0.0–0.2)

## 2022-02-07 LAB — RETICULOCYTES
Immature Retic Fract: 13 % (ref 2.3–15.9)
RBC.: 3.63 MIL/uL — ABNORMAL LOW (ref 3.87–5.11)
Retic Count, Absolute: 88.9 10*3/uL (ref 19.0–186.0)
Retic Ct Pct: 2.5 % (ref 0.4–3.1)

## 2022-02-07 LAB — COMPREHENSIVE METABOLIC PANEL
ALT: 28 U/L (ref 0–44)
AST: 27 U/L (ref 15–41)
Albumin: 3.6 g/dL (ref 3.5–5.0)
Alkaline Phosphatase: 62 U/L (ref 38–126)
Anion gap: 8 (ref 5–15)
BUN: 41 mg/dL — ABNORMAL HIGH (ref 8–23)
CO2: 31 mmol/L (ref 22–32)
Calcium: 8.8 mg/dL — ABNORMAL LOW (ref 8.9–10.3)
Chloride: 93 mmol/L — ABNORMAL LOW (ref 98–111)
Creatinine, Ser: 1.52 mg/dL — ABNORMAL HIGH (ref 0.44–1.00)
GFR, Estimated: 33 mL/min — ABNORMAL LOW (ref >60)
Glucose, Bld: 86 mg/dL (ref 70–99)
Potassium: 4.3 mmol/L (ref 3.5–5.1)
Sodium: 132 mmol/L — ABNORMAL LOW (ref 135–145)
Total Bilirubin: 0.6 mg/dL (ref 0.3–1.2)
Total Protein: 7.2 g/dL (ref 6.5–8.1)

## 2022-02-07 LAB — FERRITIN: Ferritin: 38 ng/mL (ref 11–307)

## 2022-02-07 LAB — LACTATE DEHYDROGENASE: LDH: 202 U/L — ABNORMAL HIGH (ref 98–192)

## 2022-02-07 NOTE — Telephone Encounter (Signed)
Summary: pressure sore  ? Pt's daughter called in, pt has pressure sore, that has skin breakdown   ?  ? ?Returned daughter's call - LMOM ?

## 2022-02-07 NOTE — Telephone Encounter (Signed)
Please advise/order

## 2022-02-07 NOTE — Telephone Encounter (Signed)
Chief Complaint: Pressure ulcer ?Symptoms: Draining, no odor ?Frequency: Reopened on Wednesday ?Pertinent Negatives: Patient denies other symptoms ?Disposition: '[]'$ ED /'[]'$ Urgent Care (no appt availability in office) / '[x]'$ Appointment(In office/virtual)/ '[]'$  [place] [facility]/ '[]'$ Home Care/ '[]'$ Refused Recommended Disposition /'[]'$  Mobile Bus/ '[]'$  Follow-up with PCP ?Additional Notes: Daughter says that the pressure ulcer was closed and she was discharged from home health on  ?Wednesday. She says it opened back up on Wednesday evening, located to the left of the crack of sacral area, size 1/2 in wide. She says home health says if the provider would send in orders to restart wound therapy, they could come out this weekend.  ? ? ? ?Summary: pressure sore  ? Patient daughter returning NT call and would like a follow up call.  ? ?----- Message from Bayard Beaver sent at 02/07/2022  9:31 AM EDT -----  ?Pt's daughter called in, pt has pressure sore, that has skin breakdown   ?  ? ?Reason for Disposition ? [1] Needs new dressing (e.g., dressing came off, got too wet or leaking) AND [2] patient is not able to replace same type of dressing (e.g., not given supplies or ran out) ? ?Answer Assessment - Initial Assessment Questions ?1. APPEARANCE of SORES: "What do the sores look like?" ?    Bed sore, oozing ?2. NUMBER: "How many sores are there?" ?    2 ?3. SIZE: "How big is the largest sore?" ?    1/2 in diameter ?4. LOCATION: "Where are the sores located?" ?    To the left of the crack of the buttocks ?5. ONSET: "When did the sores begin?" ?    Open back up Wednesday ?6. CAUSE: "What do you think is causing the sores?" ?    Pressure ulcer ?7. OTHER SYMPTOMS: "Do you have any other symptoms?" (e.g., fever, new weakness) ?    Oozing, no odor ? ?Answer Assessment - Initial Assessment Questions ?1. SYMPTOM or QUESTION: "What is your reason for calling today?" or "How can I best help you?" (e.g., bleeding, redness,  drainage, pain) ?    Pressure ulcer and home health says to send in orders and they could come out this weekend ?2. WOUND APPEARANCE: "What does the wound look like?" "Is there new or spreading redness?" If Yes, ask: "What is the size of the red area?" (Inches, centimeters, or compare to size of a coin).  "Has the drainage changed or increased?"  "Is there a new odor?"   ?    About 1/2 inch, no odor, drainage started ?3. ONSET: "When did the problem begin?" ?    Opened up on Wednesday ?4. LOCATION: "Where is the wound(s)?" (e.g., , ankle, lower left leg) ?    To the left of the crack of the butt ?5. BLEEDING: "Are you having a problem with bleeding?"  (e.g., amount, timing) ?    N/A ?6. PAIN: "Is there any pain?" If Yes, ask: "How bad is the pain?" (Scale 1-10; or mild, moderate, severe) ?    Yes pain to sit ?7. FEVER: "Do you have a fever?" If Yes, ask: "What is your temperature, how was it measured, and when did it start?" ?    No ?8. OTHER SYMPTOMS: "Do you have any other symptoms?" (e.g., chills, rash elsewhere, new weakness) ?    No ?9. TREATMENT: "How have you been treating the wound?" ?    She was being treated and was released on Wednesday because it was closed up ? ?  Protocols used: Wound - Chronic and Negative Pressure Wound Therapy-A-AH, Sores-A-AH ? ?

## 2022-02-08 LAB — HAPTOGLOBIN: Haptoglobin: <10 mg/dL — ABNORMAL LOW (ref 41–333)

## 2022-02-08 NOTE — Telephone Encounter (Signed)
That's fine, can restart home wound therapy.  ?

## 2022-02-09 NOTE — Progress Notes (Signed)
? ? ? ?Hematology/Oncology Consult note ?[place]  ?[phone] [phone] ? ?Patient Care Team: ?Virginia Crews, MD as PCP - General (Family Medicine) ?Vladimir Crofts, MD as Consulting Physician (Neurology) ?Sharlet Salina, MD as Referring Physician (Physical Medicine and Rehabilitation) ?Estill Cotta, MD (Ophthalmology) ?Meeler, Sherren Kerns, FNP (Family Medicine) ?Neldon Labella, RN as Registered Nurse ?Quintin Alto, MD as Consulting Physician (Rheumatology)  ? ?Name of the patient: Helen Ramsey  ?[id number]  ?11-04-1933  ? ?Date of visit: 02/09/22 ? ?Diagnosis- history of MGUS now referred for re evaluation ? ?Chief complaint/ Reason for visit- referred for re evaluation of MGUS ? ?Heme/Onc history: Patient is a 88 year old female who was last seen by me in November 2019.  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  She was noted to have IgG lambda M protein on immunofixation but not on SPEP.  Repeat myeloma studies in May 2018 did not show any M protein in both SPEP and IFE and therefore patient was discharged from my clinic.  She is presently seeing rheumatology after she was found to have morphea over her right thigh which is being treated by dermatology.  She was worked up for possible systemic sclerosis by rheumatology but was not found to have the same. ? ?Interval history-she is doing well for her age.  No recent hospitalizations.Morphea is being managed by topical creams prescribed by dermatology. ? ?ECOG PS- 2 ?Pain scale- 0 ? ?Review of systems- Review of Systems  ?Constitutional:  Positive for malaise/fatigue. Negative for chills, fever and weight loss.  ?HENT:  Negative for congestion, ear discharge and nosebleeds.   ?Eyes:  Negative for blurred vision.  ?Respiratory:  Negative for cough, hemoptysis, sputum production, shortness of breath and wheezing.   ?Cardiovascular:  Negative for chest pain, palpitations, orthopnea and  claudication.  ?Gastrointestinal:  Negative for abdominal pain, blood in stool, constipation, diarrhea, heartburn, melena, nausea and vomiting.  ?Genitourinary:  Negative for dysuria, flank pain, frequency, hematuria and urgency.  ?Musculoskeletal:  Negative for back pain, joint pain and myalgias.  ?Skin:  Positive for rash.  ?Neurological:  Negative for dizziness, tingling, focal weakness, seizures, weakness and headaches.  ?Endo/Heme/Allergies:  Does not bruise/bleed easily.  ?Psychiatric/Behavioral:  Negative for depression and suicidal ideas. The patient does not have insomnia.    ? ? ?Allergies  ?Allergen Reactions  ? Amoxicillin   ? Clindamycin/Lincomycin   ? Doxycycline   ? Levaquin [Levofloxacin In D5w]   ? Mirabegron Nausea And Vomiting  ? Nsaids Other (See Comments)  ?  GI upset  ? Oxycodone Nausea Only  ?  Other reaction(s): Hallucination  ? Phenergan [Promethazine Hcl]   ? Singulair [Montelukast]   ? Vicodin [Hydrocodone-Acetaminophen]   ? Zocor [Simvastatin]   ? Zoloft  [Sertraline] Other (See Comments)  ? Ampicillin Rash  ? Bactrim [Sulfamethoxazole-Trimethoprim] Rash  ? Penicillins Rash  ?  Ampicillin, Clindamycin, Doxycycline ?Levaquin-severe headache, muscle and joint aches, weak  ? Tolmetin   ?  Other reaction(s): Other (See Comments) ?GI upset  ? ? ? ?Past Medical History:  ?Diagnosis Date  ? Abnormal SPEP   ? Allergic rhinitis, cause unspecified   ? Allergic rhinitis, cause unspecified   ? Atherosclerosis of aorta ([place])   ? Back pain   ? lumbar  ? Degeneration of lumbar or lumbosacral intervertebral disc   ? Diverticulitis of colon (without mention of hemorrhage)(562.11)   ? Dizziness and giddiness   ? Dysthymic disorder   ?  Headache(784.0)   ? Myalgia and myositis, unspecified   ? Osteoporosis, unspecified   ? Other and unspecified hyperlipidemia   ? Other diseases of nasal cavity and sinuses(478.19)   ? Other specified disorders of bladder   ? Syncope   ? Trigger finger (acquired)   ?  Unspecified essential hypertension   ? Unspecified sleep apnea   ? Urinary frequency   ? ? ? ?Past Surgical History:  ?Procedure Laterality Date  ? ABDOMINAL HYSTERECTOMY    ? ADENOIDECTOMY    ? APPENDECTOMY    ? ARTHROSCOPIC KNEE SURGERY    ? BREAST BIOPSY Right   ? lymph node surgery, benign   ? BREAST EXCISIONAL BIOPSY    ? CARDIAC CATHETERIZATION    ? [place]  ? CARPAL TUNNEL RELEASE    ? CATARACT EXTRACTION W/ INTRAOCULAR LENS IMPLANT & ANTERIOR VITRECTOMY, BILATERAL    ? CHOLECYSTECTOMY    ? KNEE ARTHROPLASTY Right 01/30/2016  ? Procedure: COMPUTER ASSISTED TOTAL KNEE ARTHROPLASTY;  Surgeon: Dereck Leep, MD;  Location: [facility] ORS;  Service: Orthopedics;  Laterality: Right;  ? R/O Lymph Nodes; Right Axilla    ? ROTATOR CUFF REPAIR Right   ? SPG block  03/20/2016  ? sphenopalatine ganglion and maxillary division of trigeminal nerve block block Dr. Manuella Ghazi  ? [place] / LIGATION    ? TONSILLECTOMY    ? TUBAL LIGATION    ? ? ?Social History  ? ?Socioeconomic History  ? Marital status: Widowed  ?  Spouse name: Not on file  ? Number of children: 3  ? Years of education: Not on file  ? Highest education level: Some college, no degree  ?Occupational History  ? Occupation: retired  ?Tobacco Use  ? Smoking status: Never  ? Smokeless tobacco: Never  ?Vaping Use  ? Vaping Use: Never used  ?Substance and Sexual Activity  ? Alcohol use: No  ? Drug use: No  ? Sexual activity: Not on file  ?Other Topics Concern  ? Not on file  ?Social History Narrative  ? Not on file  ? ?Social Determinants of Health  ? ?Financial Resource Strain: Not on file  ?Food Insecurity: Not on file  ?Transportation Needs: Not on file  ?Physical Activity: Not on file  ?Stress: Not on file  ?Social Connections: Not on file  ?Intimate Partner Violence: Not on file  ? ? ?Family History  ?Problem Relation Age of Onset  ? Heart disease Mother   ? Depression Mother   ? Colon polyps Mother   ? Dementia Mother   ? Heart disease Father   ? Dementia  Father   ? Heart disease Brother   ? Hypertension Brother   ? Heart disease Brother   ? Hypertension Brother   ? Kidney cancer Neg Hx   ? Bladder Cancer Neg Hx   ? ? ? ?Current Outpatient Medications:  ?  acetaminophen (TYLENOL) 500 MG tablet, Take 500 mg by mouth in the morning and at bedtime. , Disp: , Rfl:  ?  aspirin EC 81 MG tablet, Take 81 mg by mouth daily., Disp: , Rfl:  ?  cetirizine (ZYRTEC) 10 MG tablet, Take 10 mg daily by mouth., Disp: , Rfl:  ?  citalopram (CELEXA) 40 MG tablet, Take 1 tablet (40 mg total) by mouth daily., Disp: 90 tablet, Rfl: 1 ?  furosemide (LASIX) 80 MG tablet, Take 1 tablet (80 mg total) by mouth daily., Disp: 90 tablet, Rfl: 1 ?  gabapentin (NEURONTIN) 100 MG  capsule, Take 2 capsules (200 mg total) by mouth 2 (two) times daily. Start Gabapentin 200 mg (one tablet) in the morning, 200 mg in the afternoon (one tablet), and 300 mg in the evening., Disp: 360 capsule, Rfl: 1 ?  gabapentin (NEURONTIN) 300 MG capsule, Take 1 capsule (300 mg total) by mouth at bedtime., Disp: 90 capsule, Rfl: 1 ?  Galcanezumab-gnlm (EMGALITY [place]), Inject 300 mg as directed every 30 (thirty) days. , Disp: , Rfl: 0 ?  hydroxychloroquine (PLAQUENIL) 200 MG tablet, Take 200 mg by mouth daily., Disp: , Rfl:  ?  levothyroxine (SYNTHROID) 25 MCG tablet, Take by mouth., Disp: , Rfl:  ?  lisinopril (ZESTRIL) 5 MG tablet, Take 1 tablet (5 mg total) by mouth daily., Disp: 90 tablet, Rfl: 1 ?  MAGNESIUM GLYCINATE PO, Take 500 mg by mouth daily., Disp: , Rfl:  ?  melatonin 5 MG TABS, Take 5 mg by mouth at bedtime as needed., Disp: , Rfl:  ?  Omega-3 Fatty Acids (FISH OIL) 1200 MG CAPS, Take 2 capsules by mouth daily. , Disp: , Rfl:  ?  omeprazole (PRILOSEC) 20 MG capsule, TAKE 1 CAPSULE(20 MG) BY MOUTH DAILY, Disp: 90 capsule, Rfl: 1 ?  phenazopyridine (AZO-TABS) 95 MG tablet, Take 1 tablet (95 mg total) by mouth as needed. (Patient taking differently: Take 95 mg by mouth 2 (two) times daily.), Disp: 30 tablet, Rfl:  3 ?  Polyethylene Glycol 3350 (MIRALAX PO), Take by mouth., Disp: , Rfl:  ?  potassium gluconate 595 (99 K) MG TABS tablet, Take 1 tablet (595 mg total) by mouth 2 (two) times daily., Disp: 180 tablet, Rfl: 1 ?  traM

## 2022-02-10 LAB — KAPPA/LAMBDA LIGHT CHAINS
Kappa free light chain: 57.1 mg/L — ABNORMAL HIGH (ref 3.3–19.4)
Kappa, lambda light chain ratio: 1.85 — ABNORMAL HIGH (ref 0.26–1.65)
Lambda free light chains: 30.8 mg/L — ABNORMAL HIGH (ref 5.7–26.3)

## 2022-02-12 LAB — PROTEIN ELECTRO, RANDOM URINE
Albumin ELP, Urine: 100 %
Alpha-1-Globulin, U: 0 %
Alpha-2-Globulin, U: 0 %
Beta Globulin, U: 0 %
Gamma Globulin, U: 0 %
Total Protein, Urine: 4.1 mg/dL

## 2022-02-12 LAB — MULTIPLE MYELOMA PANEL, SERUM
Albumin SerPl Elph-Mcnc: 3.5 g/dL (ref 2.9–4.4)
Albumin/Glob SerPl: 1.1 (ref 0.7–1.7)
Alpha 1: 0.2 g/dL (ref 0.0–0.4)
Alpha2 Glob SerPl Elph-Mcnc: 0.6 g/dL (ref 0.4–1.0)
B-Globulin SerPl Elph-Mcnc: 1 g/dL (ref 0.7–1.3)
Gamma Glob SerPl Elph-Mcnc: 1.4 g/dL (ref 0.4–1.8)
Globulin, Total: 3.3 g/dL (ref 2.2–3.9)
IgA: 197 mg/dL (ref 64–422)
IgG (Immunoglobin G), Serum: 1492 mg/dL (ref 586–1602)
IgM (Immunoglobulin M), Srm: 142 mg/dL (ref 26–217)
Total Protein ELP: 6.8 g/dL (ref 6.0–8.5)

## 2022-02-14 ENCOUNTER — Ambulatory Visit (INDEPENDENT_AMBULATORY_CARE_PROVIDER_SITE_OTHER): Payer: Medicare Other | Admitting: Family Medicine

## 2022-02-14 ENCOUNTER — Ambulatory Visit (INDEPENDENT_AMBULATORY_CARE_PROVIDER_SITE_OTHER): Payer: Medicare Other

## 2022-02-14 ENCOUNTER — Encounter: Payer: Self-pay | Admitting: Family Medicine

## 2022-02-14 ENCOUNTER — Other Ambulatory Visit: Payer: Self-pay | Admitting: Family Medicine

## 2022-02-14 VITALS — BP 117/68 | HR 67 | Temp 98.4°F | Resp 16 | Wt 159.1 lb

## 2022-02-14 DIAGNOSIS — I10 Essential (primary) hypertension: Secondary | ICD-10-CM | POA: Diagnosis not present

## 2022-02-14 DIAGNOSIS — Z9181 History of falling: Secondary | ICD-10-CM

## 2022-02-14 DIAGNOSIS — E782 Mixed hyperlipidemia: Secondary | ICD-10-CM | POA: Diagnosis not present

## 2022-02-14 DIAGNOSIS — L89322 Pressure ulcer of left buttock, stage 2: Secondary | ICD-10-CM | POA: Insufficient documentation

## 2022-02-14 DIAGNOSIS — I5032 Chronic diastolic (congestive) heart failure: Secondary | ICD-10-CM | POA: Diagnosis not present

## 2022-02-14 MED ORDER — LEVOTHYROXINE SODIUM 50 MCG PO TABS: ORAL_TABLET | ORAL | 0 refills | Status: DC

## 2022-02-14 MED ORDER — GABAPENTIN 300 MG PO CAPS: 300.0000 mg | ORAL_CAPSULE | Freq: Every evening | ORAL | 1 refills | Status: DC

## 2022-02-14 MED ORDER — CITALOPRAM HYDROBROMIDE 40 MG PO TABS: 40.0000 mg | ORAL_TABLET | Freq: Every day | ORAL | 1 refills | Status: DC

## 2022-02-14 NOTE — Progress Notes (Signed)
?  ? ?I,Sulibeya S Dimas,acting as a scribe for Lavon Paganini, MD.,have documented all relevant documentation on the behalf of Lavon Paganini, MD,as directed by  Lavon Paganini, MD while in the presence of Lavon Paganini, MD. ? ? ?Established patient visit ? ? ?Patient: Helen Ramsey   DOB: July 12, 1933   88 y.o. Female  MRN: [id number] ?Visit Date: 02/14/2022 ? ?Today's healthcare provider: Lavon Paganini, MD  ? ?Chief Complaint  ?Patient presents with  ? Wound Infection  ? ?Subjective  ?  ?HPI  ?Patient here today C/O pressure ulcer was closed and was discharged from home health on Tuesday. Patient reports wound reopened on Wednesday evening. Pressure ulcer is located on left buttock.  ? ?Medications: ?Outpatient Medications Prior to Visit  ?Medication Sig  ? acetaminophen (TYLENOL) 500 MG tablet Take 500 mg by mouth in the morning and at bedtime.   ? ALPRAZolam (XANAX) 0.25 MG tablet TAKE 1 TABLET BY MOUTH TWICE DAILY AS NEEDED FOR ANXIETY  ? aspirin EC 81 MG tablet Take 81 mg by mouth daily.  ? atorvastatin (LIPITOR) 20 MG tablet TAKE 1 TABLET(20 MG) BY MOUTH DAILY  ? BREO ELLIPTA 100-25 MCG/ACT AEPB INHALE 1 PUFF INTO THE LUNGS DAILY  ? cetirizine (ZYRTEC) 10 MG tablet Take 10 mg daily by mouth.  ? Cholecalciferol (VITAMIN D3 PO) Take 1,000 Units by mouth.  ? cyanocobalamin 1000 MCG tablet Take 1,000 mcg by mouth daily.  ? Dentifrices (FLUORIDE TOOTHPASTE DT) Place onto teeth daily.   ? famotidine (PEPCID) 20 MG tablet TAKE 1 TABLET(20 MG) BY MOUTH DAILY  ? furosemide (LASIX) 80 MG tablet TAKE 1 TABLET(80 MG) BY MOUTH DAILY  ? gabapentin (NEURONTIN) 100 MG capsule Take 2 capsules (200 mg total) by mouth 2 (two) times daily. Start Gabapentin 200 mg (one tablet) in the morning, 200 mg in the afternoon (one tablet), and 300 mg in the evening.  ? Galcanezumab-gnlm (EMGALITY [place]) Inject 300 mg as directed every 30 (thirty) days.   ? hydroxychloroquine (PLAQUENIL) 200 MG tablet Take 200 mg by mouth  daily.  ? lisinopril (ZESTRIL) 5 MG tablet Take 1 tablet (5 mg total) by mouth daily.  ? MAGNESIUM GLYCINATE PO Take 500 mg by mouth daily.  ? melatonin 5 MG TABS Take 5 mg by mouth at bedtime as needed.  ? Omega-3 Fatty Acids (FISH OIL) 1200 MG CAPS Take 2 capsules by mouth daily.   ? omeprazole (PRILOSEC) 20 MG capsule TAKE 1 CAPSULE(20 MG) BY MOUTH DAILY  ? phenazopyridine (AZO-TABS) 95 MG tablet Take 1 tablet (95 mg total) by mouth as needed. (Patient taking differently: Take 95 mg by mouth 2 (two) times daily.)  ? Polyethylene Glycol 3350 (MIRALAX PO) Take by mouth.  ? potassium gluconate 595 (99 K) MG TABS tablet Take 1 tablet (595 mg total) by mouth 2 (two) times daily.  ? sodium chloride (OCEAN) 0.65 % nasal spray Place 1 spray into the nose as needed.  ? traMADol (ULTRAM) 50 MG tablet TAKE 1 TABLET BY MOUTH TWICE DAILY AS NEEDED (Patient taking differently: Take 50 mg by mouth 3 (three) times daily. TAKE 1 TABLET BY MOUTH 3 times daily)  ? [DISCONTINUED] citalopram (CELEXA) 40 MG tablet Take 1 tablet (40 mg total) by mouth daily.  ? [DISCONTINUED] gabapentin (NEURONTIN) 300 MG capsule Take 1 capsule (300 mg total) by mouth at bedtime.  ? [DISCONTINUED] levothyroxine (SYNTHROID) 50 MCG tablet TAKE 1 TABLET(50 MCG) BY MOUTH DAILY BEFORE BREAKFAST  ? [DISCONTINUED] levothyroxine (SYNTHROID) 25  MCG tablet Take by mouth. (Patient not taking: Reported on 02/14/2022)  ? ?No facility-administered medications prior to visit.  ? ? ?Review of Systems  ?Constitutional:  Negative for appetite change and fever.  ?Respiratory:  Negative for shortness of breath.   ?Cardiovascular:  Negative for chest pain.  ?Skin:  Positive for wound.  ? ? ?  Objective  ?  ?BP 117/68 (BP Location: Left Arm, Patient Position: Sitting, Cuff Size: Large)   Pulse 67   Temp 98.4 ?F (36.9 ?C) (Oral)   Resp 16   Wt 159 lb 1.6 oz (72.2 kg)   BMI 32.41 kg/m?  ? ? ?Physical Exam ?Vitals reviewed.  ?Constitutional:   ?   General: She is not in  acute distress. ?   Appearance: She is well-developed.  ?HENT:  ?   Head: Normocephalic and atraumatic.  ?Eyes:  ?   General: No scleral icterus. ?   Conjunctiva/sclera: Conjunctivae normal.  ?Cardiovascular:  ?   Rate and Rhythm: Normal rate and regular rhythm.  ?Pulmonary:  ?   Effort: Pulmonary effort is normal. No respiratory distress.  ?Skin: ?   General: Skin is warm and dry.  ?   Comments: Pressure sore with small ulceration of L buttocks. Scabbed over on R buttocks  ?Neurological:  ?   Mental Status: She is alert and oriented to person, place, and time.  ?Psychiatric:     ?   Behavior: Behavior normal.  ?  ? ? ?No results found for any visits on 02/14/22. ? Assessment & Plan  ?  ? ?Problem List Items Addressed This Visit   ? ?  ? Other  ? Pressure injury of left buttock, stage 2 ([place]) - Primary  ?  Patient has done well with home health RN previously for wound care ?The wound had healed and she was discharged and now has opened back up again ?She does have some ulcerations consistent with a stage II pressure ulcer of the left buttock ?The right buttock appears healed with some scabbing ?Continue pressure dressings ?We will reengage home health RN for further wound care at home ?Advised to keep the area clean and dry ?No signs of infection currently ?  ?  ? Relevant Orders  ? Ambulatory referral to Home Health  ?  ? ?Return for as scheduled.  ?   ? ?Total time spent on today's visit was greater than 30 minutes, including both face-to-face time and nonface-to-face time personally spent on review of chart, discussing goals, treatment options, referrals to home health, answering patient's questions, and coordinating care.  ? ? ?I, Lavon Paganini, MD, have reviewed all documentation for this visit. The documentation on 02/14/22 for the exam, diagnosis, procedures, and orders are all accurate and complete. ? ? ?Virginia Crews, MD, MPH ?[place] ?[facility]   ?

## 2022-02-14 NOTE — Assessment & Plan Note (Signed)
Patient has done well with home health RN previously for wound care ?The wound had healed and she was discharged and now has opened back up again ?She does have some ulcerations consistent with a stage II pressure ulcer of the left buttock ?The right buttock appears healed with some scabbing ?Continue pressure dressings ?We will reengage home health RN for further wound care at home ?Advised to keep the area clean and dry ?No signs of infection currently ?

## 2022-02-14 NOTE — Chronic Care Management (AMB) (Addendum)
Chronic Care Management   CCM RN Visit Note  02/14/2022 Name: Helen Ramsey MRN: [id number] DOB: 11/12/1933  Subjective: Helen Ramsey is a 88 y.o. year old female who is a primary care patient of Bacigalupo, [name], MD. The care management team was consulted for assistance with disease management and care coordination needs.    Engaged with patient's caregiver by telephone for initial visit in response to provider referral for case management and care coordination services.   Consent to Services:  The patient was given the following information about Chronic Care Management services 1. CCM service includes personalized support from designated clinical staff supervised by the primary care provider, including individualized plan of care and coordination with other care providers 2. 24/7 contact phone numbers for assistance for urgent and routine care needs. 3. Service will only be billed when office clinical staff spend 20 minutes or more in a month to coordinate care. 4. Only one practitioner may furnish and bill the service in a calendar month. 5.The patient may stop CCM services at any time (effective at the end of the month) by phone call to the office staff. 6. The patient will be responsible for cost sharing (co-pay) of up to 20% of the service fee (after annual deductible is met). Patient agreed to services and consent obtained.  Assessment: Review of patient past medical history, allergies, medications, health status, including review of consultants reports, laboratory and other test data, was performed as part of comprehensive evaluation and provision of chronic care management services.   SDOH (Social Determinants of Health) assessments and interventions performed:    CCM Care Plan  Allergies  Allergen Reactions   Amoxicillin    Clindamycin/Lincomycin    Doxycycline    Levaquin [Levofloxacin In D5w]    Mirabegron Nausea And Vomiting   Nsaids Other (See Comments)    GI  upset   Oxycodone Nausea Only    Other reaction(s): Hallucination   Phenergan [Promethazine Hcl]    Singulair [Montelukast]    Vicodin [Hydrocodone-Acetaminophen]    Zocor [Simvastatin]    Zoloft  [Sertraline] Other (See Comments)   Ampicillin Rash   Bactrim [Sulfamethoxazole-Trimethoprim] Rash   Penicillins Rash    Ampicillin, Clindamycin, Doxycycline Levaquin-severe headache, muscle and joint aches, weak   Tolmetin     Other reaction(s): Other (See Comments) GI upset    Outpatient Encounter Medications as of 02/14/2022  Medication Sig Note   acetaminophen (TYLENOL) 500 MG tablet Take 500 mg by mouth in the morning and at bedtime.     ALPRAZolam (XANAX) 0.25 MG tablet TAKE 1 TABLET BY MOUTH TWICE DAILY AS NEEDED FOR ANXIETY    aspirin EC 81 MG tablet Take 81 mg by mouth daily.    atorvastatin (LIPITOR) 20 MG tablet TAKE 1 TABLET(20 MG) BY MOUTH DAILY    BREO ELLIPTA 100-25 MCG/ACT AEPB INHALE 1 PUFF INTO THE LUNGS DAILY    cetirizine (ZYRTEC) 10 MG tablet Take 10 mg daily by mouth.    Cholecalciferol (VITAMIN D3 PO) Take 1,000 Units by mouth.    citalopram (CELEXA) 40 MG tablet Take 1 tablet (40 mg total) by mouth daily.    cyanocobalamin 1000 MCG tablet Take 1,000 mcg by mouth daily.    Dentifrices (FLUORIDE TOOTHPASTE DT) Place onto teeth daily.     famotidine (PEPCID) 20 MG tablet TAKE 1 TABLET(20 MG) BY MOUTH DAILY    furosemide (LASIX) 80 MG tablet TAKE 1 TABLET(80 MG) BY MOUTH DAILY    gabapentin (NEURONTIN)  100 MG capsule Take 2 capsules (200 mg total) by mouth 2 (two) times daily. Start Gabapentin 200 mg (one tablet) in the morning, 200 mg in the afternoon (one tablet), and 300 mg in the evening.    gabapentin (NEURONTIN) 300 MG capsule Take 1 capsule (300 mg total) by mouth at bedtime.    Galcanezumab-gnlm (EMGALITY [place]) Inject 300 mg as directed every 30 (thirty) days.     hydroxychloroquine (PLAQUENIL) 200 MG tablet Take 200 mg by mouth daily.    levothyroxine  (SYNTHROID) 50 MCG tablet TAKE 1 TABLET(50 MCG) BY MOUTH DAILY BEFORE BREAKFAST    lisinopril (ZESTRIL) 5 MG tablet Take 1 tablet (5 mg total) by mouth daily.    MAGNESIUM GLYCINATE PO Take 500 mg by mouth daily.    melatonin 5 MG TABS Take 5 mg by mouth at bedtime as needed.    Omega-3 Fatty Acids (FISH OIL) 1200 MG CAPS Take 2 capsules by mouth daily.     omeprazole (PRILOSEC) 20 MG capsule TAKE 1 CAPSULE(20 MG) BY MOUTH DAILY    phenazopyridine (AZO-TABS) 95 MG tablet Take 1 tablet (95 mg total) by mouth as needed. (Patient taking differently: Take 95 mg by mouth 2 (two) times daily.) 05/16/2021: Patient reports that she takes twice a day   Polyethylene Glycol 3350 (MIRALAX PO) Take by mouth.    potassium gluconate 595 (99 K) MG TABS tablet Take 1 tablet (595 mg total) by mouth 2 (two) times daily.    sodium chloride (OCEAN) 0.65 % nasal spray Place 1 spray into the nose as needed. 11/29/2015: Received from: [facility]   traMADol (ULTRAM) 50 MG tablet TAKE 1 TABLET BY MOUTH TWICE DAILY AS NEEDED (Patient taking differently: Take 50 mg by mouth 3 (three) times daily. TAKE 1 TABLET BY MOUTH 3 times daily)    No facility-administered encounter medications on file as of 02/14/2022.    Patient Active Problem List   Diagnosis Date Noted   Pressure injury of left buttock, stage 2 ([place]) 02/14/2022   Rash and nonspecific skin eruption 12/06/2021   Tremor 09/12/2021   Acquired hypothyroidism 05/17/2021   (HFpEF) heart failure with preserved ejection fraction ([place]) 05/16/2021   History of dyspnea 01/26/2021   Recurrent major depressive disorder, in partial remission ([place]) 02/27/2020   Bilateral myofascial pain 06/01/2019   Bilateral occipital neuralgia 06/01/2019   Age-related osteoporosis without current pathological fracture 08/16/2018   Gait instability 08/16/2018   Dorsalgia 03/31/2016   H/O total knee replacement 02/14/2016   Vitamin D deficiency 11/29/2015   Allergy to  other foods 11/29/2015   Knee pain, bilateral 08/29/2015   Sleep apnea 05/30/2015   Mixed hyperlipidemia 05/09/2015   Restless legs syndrome 05/09/2015   Allergic rhinitis 03/28/2015   Anemia 03/28/2015   Chronic headache 03/28/2015   DDD (degenerative disc disease), lumbar 03/28/2015   Gastro-esophageal reflux disease without esophagitis 03/28/2015   Essential (primary) hypertension 03/28/2015   Adaptive colitis 03/28/2015   OP (osteoporosis) 03/28/2015   Nasal septal perforation 03/28/2015   Spinal stenosis 03/28/2015   Gastric ulcer without hemorrhage or perforation 03/28/2015   Cerebral artery occlusion with cerebral infarction ([place]) 03/28/2015   Connective tissue and disc stenosis of intervertebral foramina of abdomen and other regions 03/28/2015   Diverticulosis of large intestine without perforation or abscess without bleeding 03/28/2015   Osteoarthritis 03/28/2015   Other specified functional intestinal disorders 03/28/2015   Hardening of the aorta (main artery of the heart) ([place]) 07/13/2014   H/O  adenomatous polyp of colon 06/27/2005    Patient Care Plan: RN Care Management     Problem Identified: HF, HTN, HLD and Fall Risk      Long-Range Goal: Disease Progression Prevented or Minimized   Start Date: 02/14/2022  Expected End Date: 05/15/2022  Priority: High  Note:   Current Barriers:  Chronic Disease Management support and education needs related to CHF, HTN, HLD, and Fall Risk.  RNCM Clinical Goal(s):  Patient will demonstrate improved adherence to prescribed treatment plan for CHF, HTN, HLD and Fall Risk through collaboration with the provider, [place] and th care team.   Interventions: 1:1 collaboration with primary care provider regarding development and update of comprehensive plan of care as evidenced by provider attestation and co-signature Inter-disciplinary care team collaboration (see longitudinal plan of care) Evaluation of current treatment  plan related to  self management and patient's adherence to plan as established by provider   Heart Failure Interventions:  (Status:  New goal.) Long Term Goal Discussed current plan for Heart Failure Management with patient's caregiver Provided information regarding recommended weight parameters. Encouraged to have patient weigh at least once a week if unable to weigh daily and record readings. Encouraged to notify a provider for weight gain greater than 3 lbs overnight or weight gain greater than 5 lbs within a week.  Discussed s/sx of fluid overload and indications for notifying a provider. Encouraged to assess symptoms daily and contact clinic with concerns as needed. Discussed nutritional intake. Advised to closely monitor sodium consumption and avoid highly processed foods when possible. Reviewed worsening s/sx related to CHF exacerbation and indications for seeking immediate medical attention.   Falls Interventions:  (Status:  New goal.) Long Term Goal Reviewed medications and discussed potential side effects that may increase risk for falls. Provided information regarding safety and fall prevention. Caregiver advised to ensure patient utilizes a safety device with all ambulation d/t history of falls.  Discussed patient's ability to perform ADL's and IADL's. Per caregiver, patient is able to perform most ADLs independently. She does require assistance with transportation and IADLs. The family is currently readily available to assist as needed.  Discussed functional status and plan of care. Caregiver reports patient's functional status is currently stable. Agreed to update the care management team if her status declines and a higher level of care is required.   Hyperlipidemia Interventions:  (Status:  New goal.) Long Term Goal Lab Results  Component Value Date   CHOL 182 08/15/2021   HDL 28 (L) 08/15/2021   LDLCALC 93 08/15/2021   TRIG 367 (H) 08/15/2021   CHOLHDL 6.5 (H) 08/15/2021   Medications reviewed  Reviewed provider established cholesterol goals  Discussed importance of ensuring patient completes regular labs as prescribed.  Reviewed role and benefits of statin for ASCVD risk reduction Discussed strategies to manage statin-induced myalgias. Caregiver reports she is currently tolerating statin well. Reviewed importance of limiting foods high in cholesterol   Hypertension Interventions:  (Status:  New goal.) Long Term Goal Last practice recorded BP readings:  BP Readings from Last 3 Encounters:  02/14/22 117/68  02/07/22 108/63  12/06/21 (!) 120/58  Most recent eGFR/CrCl:  Lab Results  Component Value Date   EGFR 38 (L) 12/06/2021    No components found for: CRCL Reviewed plan for hypertension management.  Provided information regarding established blood pressure parameters along with indications for notifying a provider. Advised to monitor and record readings.  Discussed compliance with recommended cardiac prudent diet. Encouraged to read  nutrition labels and avoid highly processed foods when possible. Discussed complications of uncontrolled blood pressure.  Reviewed s/sx of heart attack, stroke and worsening symptoms that require immediate medical attention.   Patient Goals/Self-Care Activities: Take all medications as prescribed Attend all scheduled provider appointments Call provider office for new concerns or questions     Follow Up Plan:   Will follow up next month       PLAN A member of the care management team will follow up next month.   [facility] [phone]

## 2022-02-16 DIAGNOSIS — R2681 Unsteadiness on feet: Secondary | ICD-10-CM | POA: Diagnosis not present

## 2022-02-16 DIAGNOSIS — M5136 Other intervertebral disc degeneration, lumbar region: Secondary | ICD-10-CM | POA: Diagnosis not present

## 2022-02-16 DIAGNOSIS — I110 Hypertensive heart disease with heart failure: Secondary | ICD-10-CM | POA: Diagnosis not present

## 2022-02-16 DIAGNOSIS — K5730 Diverticulosis of large intestine without perforation or abscess without bleeding: Secondary | ICD-10-CM | POA: Diagnosis not present

## 2022-02-16 DIAGNOSIS — G8929 Other chronic pain: Secondary | ICD-10-CM | POA: Diagnosis not present

## 2022-02-16 DIAGNOSIS — M810 Age-related osteoporosis without current pathological fracture: Secondary | ICD-10-CM | POA: Diagnosis not present

## 2022-02-16 DIAGNOSIS — M5137 Other intervertebral disc degeneration, lumbosacral region: Secondary | ICD-10-CM | POA: Diagnosis not present

## 2022-02-16 DIAGNOSIS — M791 Myalgia, unspecified site: Secondary | ICD-10-CM | POA: Diagnosis not present

## 2022-02-16 DIAGNOSIS — E782 Mixed hyperlipidemia: Secondary | ICD-10-CM | POA: Diagnosis not present

## 2022-02-16 DIAGNOSIS — F3341 Major depressive disorder, recurrent, in partial remission: Secondary | ICD-10-CM | POA: Diagnosis not present

## 2022-02-16 DIAGNOSIS — F418 Other specified anxiety disorders: Secondary | ICD-10-CM | POA: Diagnosis not present

## 2022-02-16 DIAGNOSIS — M1990 Unspecified osteoarthritis, unspecified site: Secondary | ICD-10-CM | POA: Diagnosis not present

## 2022-02-16 DIAGNOSIS — F341 Dysthymic disorder: Secondary | ICD-10-CM | POA: Diagnosis not present

## 2022-02-16 DIAGNOSIS — M4800 Spinal stenosis, site unspecified: Secondary | ICD-10-CM | POA: Diagnosis not present

## 2022-02-16 DIAGNOSIS — L89312 Pressure ulcer of right buttock, stage 2: Secondary | ICD-10-CM | POA: Diagnosis not present

## 2022-02-16 DIAGNOSIS — L89322 Pressure ulcer of left buttock, stage 2: Secondary | ICD-10-CM | POA: Diagnosis not present

## 2022-02-16 DIAGNOSIS — M542 Cervicalgia: Secondary | ICD-10-CM | POA: Diagnosis not present

## 2022-02-16 DIAGNOSIS — E039 Hypothyroidism, unspecified: Secondary | ICD-10-CM | POA: Diagnosis not present

## 2022-02-16 DIAGNOSIS — M7918 Myalgia, other site: Secondary | ICD-10-CM | POA: Diagnosis not present

## 2022-02-16 DIAGNOSIS — M545 Low back pain, unspecified: Secondary | ICD-10-CM | POA: Diagnosis not present

## 2022-02-16 DIAGNOSIS — I5030 Unspecified diastolic (congestive) heart failure: Secondary | ICD-10-CM | POA: Diagnosis not present

## 2022-02-16 DIAGNOSIS — M5481 Occipital neuralgia: Secondary | ICD-10-CM | POA: Diagnosis not present

## 2022-02-16 DIAGNOSIS — R011 Cardiac murmur, unspecified: Secondary | ICD-10-CM | POA: Diagnosis not present

## 2022-02-16 DIAGNOSIS — G2581 Restless legs syndrome: Secondary | ICD-10-CM | POA: Diagnosis not present

## 2022-02-16 DIAGNOSIS — R519 Headache, unspecified: Secondary | ICD-10-CM | POA: Diagnosis not present

## 2022-02-25 DIAGNOSIS — L89312 Pressure ulcer of right buttock, stage 2: Secondary | ICD-10-CM | POA: Diagnosis not present

## 2022-02-25 DIAGNOSIS — M7918 Myalgia, other site: Secondary | ICD-10-CM | POA: Diagnosis not present

## 2022-02-25 DIAGNOSIS — R2681 Unsteadiness on feet: Secondary | ICD-10-CM | POA: Diagnosis not present

## 2022-02-25 DIAGNOSIS — L89322 Pressure ulcer of left buttock, stage 2: Secondary | ICD-10-CM | POA: Diagnosis not present

## 2022-02-25 DIAGNOSIS — M545 Low back pain, unspecified: Secondary | ICD-10-CM | POA: Diagnosis not present

## 2022-02-25 DIAGNOSIS — M5481 Occipital neuralgia: Secondary | ICD-10-CM | POA: Diagnosis not present

## 2022-02-26 ENCOUNTER — Inpatient Hospital Stay: Payer: Medicare Other | Attending: Oncology | Admitting: Oncology

## 2022-02-26 ENCOUNTER — Ambulatory Visit: Payer: Self-pay

## 2022-02-26 ENCOUNTER — Encounter: Payer: Self-pay | Admitting: Oncology

## 2022-02-26 VITALS — BP 109/55 | HR 71 | Temp 98.3°F | Resp 18 | Wt 165.0 lb

## 2022-02-26 DIAGNOSIS — N179 Acute kidney failure, unspecified: Secondary | ICD-10-CM | POA: Diagnosis not present

## 2022-02-26 DIAGNOSIS — D649 Anemia, unspecified: Secondary | ICD-10-CM

## 2022-02-26 DIAGNOSIS — R768 Other specified abnormal immunological findings in serum: Secondary | ICD-10-CM | POA: Diagnosis not present

## 2022-02-26 DIAGNOSIS — Z79899 Other long term (current) drug therapy: Secondary | ICD-10-CM | POA: Diagnosis not present

## 2022-02-26 NOTE — Telephone Encounter (Signed)
. ? ?  Chief Complaint: 5 lb. Weight gain in 1 week ?Symptoms: Swelling to feet, ankles, increased SOB ?Frequency: This week ?Pertinent Negatives: Patient denies chest pain ?Disposition: '[]'$ ED /'[]'$ Urgent Care (no appt availability in office) / '[x]'$ Appointment(In office/virtual)/ '[]'$  [facility]/ '[]'$ Home Care/ '[]'$ Refused Recommended Disposition /'[]'$ [facility]/ '[]'$  Follow-up with PCP ?Additional Notes: Instructed to call back for worsening of symptoms.  ?Reason for Disposition ? [1] MILD swelling of both ankles (i.e., pedal edema) AND [2] new-onset or worsening ? ?Answer Assessment - Initial Assessment Questions ?1. ONSET: "When did the swelling start?" (e.g., minutes, hours, days) ?    1 week ago ?2. LOCATION: "What part of the leg is swollen?"  "Are both legs swollen or just one leg?" ?    Both feet and ankles ?3. SEVERITY: "How bad is the swelling?" (e.g., localized; mild, moderate, severe) ? - Localized - small area of swelling localized to one leg ? - MILD pedal edema - swelling limited to foot and ankle, pitting edema < 1/4 inch (6 mm) deep, rest and elevation eliminate most or all swelling ? - MODERATE edema - swelling of lower leg to knee, pitting edema > 1/4 inch (6 mm) deep, rest and elevation only partially reduce swelling ? - SEVERE edema - swelling extends above knee, facial or hand swelling present  ?    Mild ?4. REDNESS: "Does the swelling look red or infected?" ?    No ?5. PAIN: "Is the swelling painful to touch?" If Yes, ask: "How painful is it?"   (Scale 1-10; mild, moderate or severe) ?    No ?6. FEVER: "Do you have a fever?" If Yes, ask: "What is it, how was it measured, and when did it start?"  ?    No ?7. CAUSE: "What do you think is causing the leg swelling?" ?    Heart ?8. MEDICAL HISTORY: "Do you have a history of heart failure, kidney disease, liver failure, or cancer?" ?    Heart ?9. RECURRENT SYMPTOM: "Have you had leg swelling before?" If Yes, ask: "When was the last time?"  "What happened that time?" ?    Yes ?10. OTHER SYMPTOMS: "Do you have any other symptoms?" (e.g., chest pain, difficulty breathing) ?      SOB ?11. PREGNANCY: "Is there any chance you are pregnant?" "When was your last menstrual period?" ?      nO ? ?Protocols used: Leg Swelling and Edema-A-AH ? ?

## 2022-02-26 NOTE — Progress Notes (Signed)
Pt in for follow up, has weight gain of 6 lbs since last visit.  Pt reports swelling in feet and legs and stated is becoming short of breath more easily. ?

## 2022-02-26 NOTE — Progress Notes (Signed)
?  ? ?I,Sulibeya S Dimas,acting as a scribe for Lavon Paganini, MD.,have documented all relevant documentation on the behalf of Lavon Paganini, MD,as directed by  Lavon Paganini, MD while in the presence of Lavon Paganini, MD. ? ? ?Established patient visit ? ? ?Patient: Helen Ramsey   DOB: [birth year]   88 y.o. Female  MRN: [id number] ?Visit Date: 02/28/2022 ? ?Today's healthcare provider: Lavon Paganini, MD  ? ?Chief Complaint  ?Patient presents with  ? Edema  ? ?Subjective  ?  ?HPI  ? ?Gained >5lbs in the last week. Started 6 days ago.  +hand and leg edema.  Seems more SOB with the way she is talking . She denies any sensation of SOB.  Taking lasix daily with good compliance.  Did have high sodium the 2 days preceding this. ? ?Wt Readings from Last 3 Encounters:  ?02/28/22 163 lb 4.8 oz (74.1 kg)  ?02/26/22 165 lb (74.8 kg)  ?02/14/22 159 lb 1.6 oz (72.2 kg)  ? ? ?Medications: ?Outpatient Medications Prior to Visit  ?Medication Sig  ? acetaminophen (TYLENOL) 500 MG tablet Take 500 mg by mouth in the morning and at bedtime.   ? ALPRAZolam (XANAX) 0.25 MG tablet TAKE 1 TABLET BY MOUTH TWICE DAILY AS NEEDED FOR ANXIETY  ? aspirin EC 81 MG tablet Take 81 mg by mouth daily.  ? atorvastatin (LIPITOR) 20 MG tablet TAKE 1 TABLET(20 MG) BY MOUTH DAILY  ? BREO ELLIPTA 100-25 MCG/ACT AEPB INHALE 1 PUFF INTO THE LUNGS DAILY  ? cetirizine (ZYRTEC) 10 MG tablet Take 10 mg daily by mouth.  ? Cholecalciferol (VITAMIN D3 PO) Take 1,000 Units by mouth.  ? citalopram (CELEXA) 40 MG tablet Take 1 tablet (40 mg total) by mouth daily.  ? cyanocobalamin 1000 MCG tablet Take 1,000 mcg by mouth daily.  ? Dentifrices (FLUORIDE TOOTHPASTE DT) Place onto teeth daily.   ? famotidine (PEPCID) 20 MG tablet TAKE 1 TABLET(20 MG) BY MOUTH DAILY  ? furosemide (LASIX) 80 MG tablet TAKE 1 TABLET(80 MG) BY MOUTH DAILY  ? gabapentin (NEURONTIN) 100 MG capsule Take 100 mg by mouth. Take 200 mg in morning and afternoon  ? gabapentin  (NEURONTIN) 300 MG capsule Take 1 capsule (300 mg total) by mouth at bedtime.  ? Galcanezumab-gnlm (EMGALITY [place]) Inject 300 mg as directed every 30 (thirty) days.   ? hydroxychloroquine (PLAQUENIL) 200 MG tablet Take 200 mg by mouth daily.  ? levothyroxine (SYNTHROID) 50 MCG tablet TAKE 1 TABLET(50 MCG) BY MOUTH DAILY BEFORE BREAKFAST  ? lisinopril (ZESTRIL) 5 MG tablet Take 1 tablet (5 mg total) by mouth daily.  ? MAGNESIUM GLYCINATE PO Take 500 mg by mouth daily.  ? melatonin 5 MG TABS Take 5 mg by mouth at bedtime as needed.  ? Omega-3 Fatty Acids (FISH OIL) 1200 MG CAPS Take 2 capsules by mouth daily.   ? omeprazole (PRILOSEC) 20 MG capsule TAKE 1 CAPSULE(20 MG) BY MOUTH DAILY  ? phenazopyridine (AZO-TABS) 95 MG tablet Take 1 tablet (95 mg total) by mouth as needed. (Patient taking differently: Take 95 mg by mouth 2 (two) times daily.)  ? Polyethylene Glycol 3350 (MIRALAX PO) Take by mouth.  ? potassium gluconate 595 (99 K) MG TABS tablet Take 1 tablet (595 mg total) by mouth 2 (two) times daily.  ? sodium chloride (OCEAN) 0.65 % nasal spray Place 1 spray into the nose as needed.  ? traMADol (ULTRAM) 50 MG tablet TAKE 1 TABLET BY MOUTH TWICE DAILY AS NEEDED (Patient taking  differently: Take 50 mg by mouth 3 (three) times daily. TAKE 1 TABLET BY MOUTH 3 times daily)  ? triamcinolone cream (KENALOG) 0.1 % SMARTSIG:1 Application Topical 2-3 Times Daily  ? ?No facility-administered medications prior to visit.  ? ? ?Review of Systems  ?Constitutional:  Negative for appetite change.  ?Respiratory:  Positive for cough, shortness of breath and wheezing.   ?Cardiovascular:  Positive for leg swelling. Negative for chest pain and palpitations.  ? ? ?  Objective  ?  ?BP (!) 116/50 (BP Location: Left Arm, Patient Position: Sitting, Cuff Size: Large)   Pulse 61   Temp 98.7 ?F (37.1 ?C) (Oral)   Resp 16   Wt 163 lb 4.8 oz (74.1 kg)   SpO2 93%   BMI 33.26 kg/m?  ? ? ?Physical Exam ?Vitals reviewed.  ?Constitutional:   ?    General: She is not in acute distress. ?   Appearance: Normal appearance. She is well-developed. She is not diaphoretic.  ?HENT:  ?   Head: Normocephalic and atraumatic.  ?Eyes:  ?   General: No scleral icterus. ?   Conjunctiva/sclera: Conjunctivae normal.  ?Neck:  ?   Thyroid: No thyromegaly.  ?Cardiovascular:  ?   Rate and Rhythm: Normal rate and regular rhythm.  ?   Heart sounds: Normal heart sounds.  ?Pulmonary:  ?   Effort: Pulmonary effort is normal. No respiratory distress.  ?   Comments: Diminished breath sounds throughout ?Musculoskeletal:  ?   Cervical back: Neck supple.  ?   Right lower leg: Edema present.  ?   Left lower leg: Edema present.  ?Lymphadenopathy:  ?   Cervical: No cervical adenopathy.  ?Skin: ?   General: Skin is warm and dry.  ?Neurological:  ?   Mental Status: She is alert and oriented to person, place, and time. Mental status is at baseline.  ?Psychiatric:     ?   Mood and Affect: Mood normal.     ?   Behavior: Behavior normal.  ?  ? ? ?No results found for any visits on 02/28/22. ? Assessment & Plan  ?  ? ?Problem List Items Addressed This Visit   ? ?  ? Cardiovascular and Mediastinum  ? (HFpEF) heart failure with preserved ejection fraction ([place]) - Primary  ?  Current exacerbation, acute on chronic HFpEF ?Previously followed by cardiology, but needs to reestablish ?Continue potassium supplementation at current dose ?Increase Lasix to 80 mg twice daily for the next 5 days ?Continue to watch home weights and swelling ?Recheck BMP next week after finishing the increased dose of Lasix and resuming home dose of 80 mg daily ?Discussed low-sodium diet ?She does also have upcoming appointment with nephrology ?  ?  ? Relevant Orders  ? Ambulatory referral to Cardiology  ? Basic Metabolic Panel (BMET)  ?  ? ?No follow-ups on file.  ?   ? ?I, Lavon Paganini, MD, have reviewed all documentation for this visit. The documentation on 02/28/22 for the exam, diagnosis, procedures, and orders are all  accurate and complete. ? ? ?Virginia Crews, MD, MPH ?[place] ?[facility]   ?

## 2022-02-26 NOTE — Progress Notes (Signed)
? ? ? ?Hematology/Oncology Consult note ?[place]  ?[phone] [phone] ? ?Patient Care Team: ?Virginia Crews, MD as PCP - General (Family Medicine) ?Vladimir Crofts, MD as Consulting Physician (Neurology) ?Sharlet Salina, MD as Referring Physician (Physical Medicine and Rehabilitation) ?Estill Cotta, MD (Ophthalmology) ?Meeler, Sherren Kerns, FNP (Family Medicine) ?Neldon Labella, RN as Case Manager ?Quintin Alto, MD as Consulting Physician (Rheumatology)  ? ?Name of the patient: Helen Ramsey  ?[id number]  ?11/10/1933  ? ?Date of visit: 02/26/22 ? ?Diagnosis-elevated free light chains ? ?Chief complaint/ Reason for visit-discuss results of blood work ? ?Heme/Onc history: Patient is a 88 year old female who was last seen by me in November 2019.  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  She was noted to have IgG lambda M protein on immunofixation but not on SPEP.  Repeat myeloma studies in May 2018 did not show any M protein in both SPEP and IFE and therefore patient was discharged from my clinic.  She is presently seeing rheumatology after she was found to have morphea over her right thigh which is being treated by dermatology.  She was worked up for possible systemic sclerosis by rheumatology but was not found to have the same. ?  ? ?Interval history-patient is here with her daughter today.  No acute changes as compared to the last visit.  She has chronic skin rash over her thighs and fatigue ? ?ECOG PS- 2 ?Pain scale- 0 ? ? ?Review of systems- Review of Systems  ?Constitutional:  Positive for malaise/fatigue. Negative for chills, fever and weight loss.  ?HENT:  Negative for congestion, ear discharge and nosebleeds.   ?Eyes:  Negative for blurred vision.  ?Respiratory:  Negative for cough, hemoptysis, sputum production, shortness of breath and wheezing.   ?Cardiovascular:  Negative for chest pain, palpitations, orthopnea and claudication.   ?Gastrointestinal:  Negative for abdominal pain, blood in stool, constipation, diarrhea, heartburn, melena, nausea and vomiting.  ?Genitourinary:  Negative for dysuria, flank pain, frequency, hematuria and urgency.  ?Musculoskeletal:  Negative for back pain, joint pain and myalgias.  ?Skin:  Negative for rash.  ?Neurological:  Negative for dizziness, tingling, focal weakness, seizures, weakness and headaches.  ?Endo/Heme/Allergies:  Does not bruise/bleed easily.  ?Psychiatric/Behavioral:  Negative for depression and suicidal ideas. The patient does not have insomnia.    ? ? ?Allergies  ?Allergen Reactions  ? Amoxicillin   ? Clindamycin/Lincomycin   ? Doxycycline   ? Levaquin [Levofloxacin In D5w]   ? Mirabegron Nausea And Vomiting  ? Nsaids Other (See Comments)  ?  GI upset  ? Oxycodone Nausea Only  ?  Other reaction(s): Hallucination  ? Phenergan [Promethazine Hcl]   ? Singulair [Montelukast]   ? Vicodin [Hydrocodone-Acetaminophen]   ? Zocor [Simvastatin]   ? Zoloft  [Sertraline] Other (See Comments)  ? Ampicillin Rash  ? Bactrim [Sulfamethoxazole-Trimethoprim] Rash  ? Penicillins Rash  ?  Ampicillin, Clindamycin, Doxycycline ?Levaquin-severe headache, muscle and joint aches, weak  ? Tolmetin   ?  Other reaction(s): Other (See Comments) ?GI upset  ? ? ? ?Past Medical History:  ?Diagnosis Date  ? Abnormal SPEP   ? Allergic rhinitis, cause unspecified   ? Allergic rhinitis, cause unspecified   ? Atherosclerosis of aorta ([place])   ? Back pain   ? lumbar  ? Degeneration of lumbar or lumbosacral intervertebral disc   ? Diverticulitis of colon (without mention of hemorrhage)(562.11)   ? Dizziness and giddiness   ?  Dysthymic disorder   ? Headache(784.0)   ? Myalgia and myositis, unspecified   ? Osteoporosis, unspecified   ? Other and unspecified hyperlipidemia   ? Other diseases of nasal cavity and sinuses(478.19)   ? Other specified disorders of bladder   ? Syncope   ? Trigger finger (acquired)   ? Unspecified essential  hypertension   ? Unspecified sleep apnea   ? Urinary frequency   ? ? ? ?Past Surgical History:  ?Procedure Laterality Date  ? ABDOMINAL HYSTERECTOMY    ? ADENOIDECTOMY    ? APPENDECTOMY    ? ARTHROSCOPIC KNEE SURGERY    ? BREAST BIOPSY Right   ? lymph node surgery, benign   ? BREAST EXCISIONAL BIOPSY    ? CARDIAC CATHETERIZATION    ? [place]  ? CARPAL TUNNEL RELEASE    ? CATARACT EXTRACTION W/ INTRAOCULAR LENS IMPLANT & ANTERIOR VITRECTOMY, BILATERAL    ? CHOLECYSTECTOMY    ? KNEE ARTHROPLASTY Right 01/30/2016  ? Procedure: COMPUTER ASSISTED TOTAL KNEE ARTHROPLASTY;  Surgeon: Dereck Leep, MD;  Location: [facility] ORS;  Service: Orthopedics;  Laterality: Right;  ? R/O Lymph Nodes; Right Axilla    ? ROTATOR CUFF REPAIR Right   ? SPG block  03/20/2016  ? sphenopalatine ganglion and maxillary division of trigeminal nerve block block Dr. Manuella Ghazi  ? [place] / LIGATION    ? TONSILLECTOMY    ? TUBAL LIGATION    ? ? ?Social History  ? ?Socioeconomic History  ? Marital status: Widowed  ?  Spouse name: Not on file  ? Number of children: 3  ? Years of education: Not on file  ? Highest education level: Some college, no degree  ?Occupational History  ? Occupation: retired  ?Tobacco Use  ? Smoking status: Never  ? Smokeless tobacco: Never  ?Vaping Use  ? Vaping Use: Never used  ?Substance and Sexual Activity  ? Alcohol use: No  ? Drug use: No  ? Sexual activity: Not on file  ?Other Topics Concern  ? Not on file  ?Social History Narrative  ? Not on file  ? ?Social Determinants of Health  ? ?Financial Resource Strain: Not on file  ?Food Insecurity: Not on file  ?Transportation Needs: Not on file  ?Physical Activity: Not on file  ?Stress: Not on file  ?Social Connections: Not on file  ?Intimate Partner Violence: Not on file  ? ? ?Family History  ?Problem Relation Age of Onset  ? Heart disease Mother   ? Depression Mother   ? Colon polyps Mother   ? Dementia Mother   ? Heart disease Father   ? Dementia Father   ? Heart disease  Brother   ? Hypertension Brother   ? Heart disease Brother   ? Hypertension Brother   ? Kidney cancer Neg Hx   ? Bladder Cancer Neg Hx   ? ? ? ?Current Outpatient Medications:  ?  acetaminophen (TYLENOL) 500 MG tablet, Take 500 mg by mouth in the morning and at bedtime. , Disp: , Rfl:  ?  ALPRAZolam (XANAX) 0.25 MG tablet, TAKE 1 TABLET BY MOUTH TWICE DAILY AS NEEDED FOR ANXIETY, Disp: 60 tablet, Rfl: 0 ?  aspirin EC 81 MG tablet, Take 81 mg by mouth daily., Disp: , Rfl:  ?  atorvastatin (LIPITOR) 20 MG tablet, TAKE 1 TABLET(20 MG) BY MOUTH DAILY, Disp: 90 tablet, Rfl: 1 ?  BREO ELLIPTA 100-25 MCG/ACT AEPB, INHALE 1 PUFF INTO THE LUNGS DAILY, Disp: 180 each, Rfl:  1 ?  cetirizine (ZYRTEC) 10 MG tablet, Take 10 mg daily by mouth., Disp: , Rfl:  ?  Cholecalciferol (VITAMIN D3 PO), Take 1,000 Units by mouth., Disp: , Rfl:  ?  citalopram (CELEXA) 40 MG tablet, Take 1 tablet (40 mg total) by mouth daily., Disp: 90 tablet, Rfl: 1 ?  cyanocobalamin 1000 MCG tablet, Take 1,000 mcg by mouth daily., Disp: , Rfl:  ?  Dentifrices (FLUORIDE TOOTHPASTE DT), Place onto teeth daily. , Disp: , Rfl:  ?  famotidine (PEPCID) 20 MG tablet, TAKE 1 TABLET(20 MG) BY MOUTH DAILY, Disp: 90 tablet, Rfl: 1 ?  furosemide (LASIX) 80 MG tablet, TAKE 1 TABLET(80 MG) BY MOUTH DAILY, Disp: 90 tablet, Rfl: 1 ?  gabapentin (NEURONTIN) 100 MG capsule, Take 100 mg by mouth. Take 200 mg in morning and afternoon, Disp: , Rfl:  ?  gabapentin (NEURONTIN) 300 MG capsule, Take 1 capsule (300 mg total) by mouth at bedtime., Disp: 90 capsule, Rfl: 1 ?  Galcanezumab-gnlm (EMGALITY [place]), Inject 300 mg as directed every 30 (thirty) days. , Disp: , Rfl: 0 ?  hydroxychloroquine (PLAQUENIL) 200 MG tablet, Take 200 mg by mouth daily., Disp: , Rfl:  ?  levothyroxine (SYNTHROID) 50 MCG tablet, TAKE 1 TABLET(50 MCG) BY MOUTH DAILY BEFORE BREAKFAST, Disp: 90 tablet, Rfl: 0 ?  lisinopril (ZESTRIL) 5 MG tablet, Take 1 tablet (5 mg total) by mouth daily., Disp: 90 tablet, Rfl:  1 ?  MAGNESIUM GLYCINATE PO, Take 500 mg by mouth daily., Disp: , Rfl:  ?  melatonin 5 MG TABS, Take 5 mg by mouth at bedtime as needed., Disp: , Rfl:  ?  Omega-3 Fatty Acids (FISH OIL) 1200 MG CAPS,

## 2022-02-28 ENCOUNTER — Ambulatory Visit (INDEPENDENT_AMBULATORY_CARE_PROVIDER_SITE_OTHER): Payer: Medicare Other | Admitting: Family Medicine

## 2022-02-28 ENCOUNTER — Telehealth: Payer: Self-pay | Admitting: *Deleted

## 2022-02-28 ENCOUNTER — Encounter: Payer: Self-pay | Admitting: Family Medicine

## 2022-02-28 VITALS — BP 116/50 | HR 61 | Temp 98.7°F | Resp 16 | Wt 163.3 lb

## 2022-02-28 DIAGNOSIS — I5033 Acute on chronic diastolic (congestive) heart failure: Secondary | ICD-10-CM | POA: Diagnosis not present

## 2022-02-28 NOTE — Telephone Encounter (Signed)
Helen Ramsey faxed ref to nephrology for CK and abnormal free light chain labs ?

## 2022-02-28 NOTE — Assessment & Plan Note (Addendum)
Current exacerbation, acute on chronic HFpEF ?Previously followed by cardiology, but needs to reestablish ?Continue potassium supplementation at current dose ?Increase Lasix to 80 mg twice daily for the next 5 days ?Continue to watch home weights and swelling ?Recheck BMP next week after finishing the increased dose of Lasix and resuming home dose of 80 mg daily ?Discussed low-sodium diet ?She does also have upcoming appointment with nephrology ?

## 2022-03-03 ENCOUNTER — Other Ambulatory Visit (INDEPENDENT_AMBULATORY_CARE_PROVIDER_SITE_OTHER): Payer: Medicare Other | Admitting: Family Medicine

## 2022-03-03 DIAGNOSIS — G2581 Restless legs syndrome: Secondary | ICD-10-CM

## 2022-03-03 DIAGNOSIS — L89312 Pressure ulcer of right buttock, stage 2: Secondary | ICD-10-CM | POA: Diagnosis not present

## 2022-03-03 DIAGNOSIS — M545 Low back pain, unspecified: Secondary | ICD-10-CM

## 2022-03-03 DIAGNOSIS — Z96641 Presence of right artificial hip joint: Secondary | ICD-10-CM

## 2022-03-03 DIAGNOSIS — L89322 Pressure ulcer of left buttock, stage 2: Secondary | ICD-10-CM | POA: Diagnosis not present

## 2022-03-03 DIAGNOSIS — M51379 Other intervertebral disc degeneration, lumbosacral region without mention of lumbar back pain or lower extremity pain: Secondary | ICD-10-CM

## 2022-03-03 DIAGNOSIS — G8929 Other chronic pain: Secondary | ICD-10-CM | POA: Diagnosis not present

## 2022-03-03 DIAGNOSIS — E039 Hypothyroidism, unspecified: Secondary | ICD-10-CM

## 2022-03-03 DIAGNOSIS — R519 Headache, unspecified: Secondary | ICD-10-CM | POA: Diagnosis not present

## 2022-03-03 DIAGNOSIS — R011 Cardiac murmur, unspecified: Secondary | ICD-10-CM

## 2022-03-03 DIAGNOSIS — M5137 Other intervertebral disc degeneration, lumbosacral region: Secondary | ICD-10-CM

## 2022-03-03 DIAGNOSIS — M7918 Myalgia, other site: Secondary | ICD-10-CM | POA: Diagnosis not present

## 2022-03-03 DIAGNOSIS — M542 Cervicalgia: Secondary | ICD-10-CM

## 2022-03-03 DIAGNOSIS — Z8673 Personal history of transient ischemic attack (TIA), and cerebral infarction without residual deficits: Secondary | ICD-10-CM

## 2022-03-03 DIAGNOSIS — R2681 Unsteadiness on feet: Secondary | ICD-10-CM

## 2022-03-03 DIAGNOSIS — I5033 Acute on chronic diastolic (congestive) heart failure: Secondary | ICD-10-CM

## 2022-03-03 DIAGNOSIS — M5481 Occipital neuralgia: Secondary | ICD-10-CM | POA: Diagnosis not present

## 2022-03-03 DIAGNOSIS — M51369 Other intervertebral disc degeneration, lumbar region without mention of lumbar back pain or lower extremity pain: Secondary | ICD-10-CM

## 2022-03-03 DIAGNOSIS — G4730 Sleep apnea, unspecified: Secondary | ICD-10-CM

## 2022-03-03 DIAGNOSIS — I110 Hypertensive heart disease with heart failure: Secondary | ICD-10-CM | POA: Diagnosis not present

## 2022-03-03 DIAGNOSIS — M810 Age-related osteoporosis without current pathological fracture: Secondary | ICD-10-CM

## 2022-03-03 DIAGNOSIS — M5136 Other intervertebral disc degeneration, lumbar region: Secondary | ICD-10-CM | POA: Diagnosis not present

## 2022-03-03 DIAGNOSIS — M48061 Spinal stenosis, lumbar region without neurogenic claudication: Secondary | ICD-10-CM

## 2022-03-03 DIAGNOSIS — D649 Anemia, unspecified: Secondary | ICD-10-CM

## 2022-03-03 DIAGNOSIS — F3341 Major depressive disorder, recurrent, in partial remission: Secondary | ICD-10-CM

## 2022-03-03 DIAGNOSIS — R42 Dizziness and giddiness: Secondary | ICD-10-CM

## 2022-03-03 DIAGNOSIS — Z4800 Encounter for change or removal of nonsurgical wound dressing: Secondary | ICD-10-CM

## 2022-03-03 DIAGNOSIS — I700 Atherosclerosis of aorta: Secondary | ICD-10-CM

## 2022-03-03 DIAGNOSIS — K219 Gastro-esophageal reflux disease without esophagitis: Secondary | ICD-10-CM

## 2022-03-03 NOTE — Patient Instructions (Signed)
Visit Information  ? ?Thank you for taking time to visit with me today. Please don't hesitate to contact me if I can be of assistance to you before our next scheduled telephone appointment. ? ?Our next appointment is  ? ?Please call the care guide team at [phone] if you need to cancel or reschedule your appointment.  ? ? ?Following is a copy of your full care plan: Consent to CCM Services: ?[name] was given information about Chronic Care Management services including:  ?CCM service includes personalized support from designated clinical staff supervised by her physician, including individualized plan of care and coordination with other care providers ?24/7 contact phone numbers for assistance for urgent and routine care needs. ?Service will only be billed when office clinical staff spend 20 minutes or more in a month to coordinate care. ?Only one practitioner may furnish and bill the service in a calendar month. ?The patient may stop CCM services at any time (effective at the end of the month) by phone call to the office staff. ?The patient will be responsible for cost sharing (co-pay) of up to 20% of the service fee (after annual deductible is met). ? ?Patient agreed to services and verbal consent obtained.  ? ?The patient verbalized understanding of instructions, educational materials, and care plan provided today and declined offer to receive copy of patient instructions, educational materials, and care plan.  ? ?A member of the care management team will follow up in two months. ? ? ?Samaad Hashem,RN ?[place]/[facility] Care Management ?[facility] ?[phone] ? ? ?  ?

## 2022-03-03 NOTE — Progress Notes (Signed)
Received home health orders orders from [facility]. ?Start of care 02/16/22.   Certification and orders from 02/16/22 through 04/16/22 are reviewed, signed and faxed back to home health company. ? ?Need of intermittent skilled services at home: Unable to leave home unsupervised and without assistive devices. She has recurrent pressure ulcer of buttocks. ? ?The home health care plan has been established by me and will be reviewed and updated as needed to maximize patient recovery.  I certify that all home health services have been and will be furnished to the patient while under my care. ? ?Face-to-face encounter in which the need for home health services was established: 02/14/22 - see my OV note ? ?Patient is receiving home health services for the following diagnoses: ?Problem List Items Addressed This Visit   ? ?  ? Cardiovascular and Mediastinum  ? (HFpEF) heart failure with preserved ejection fraction ([place])  ?  ? Respiratory  ? Sleep apnea  ?  ? Digestive  ? Gastro-esophageal reflux disease without esophagitis  ?  ? Endocrine  ? Acquired hypothyroidism  ?  ? Musculoskeletal and Integument  ? Age-related osteoporosis without current pathological fracture  ?  ? Other  ? Anemia  ? Spinal stenosis  ? Restless legs syndrome  ? Recurrent major depressive disorder, in partial remission ([place])  ? ?Other Visit Diagnoses   ? ? Stage II pressure ulcer of left buttock (HCC)    -  Primary  ? Stage II pressure ulcer of right buttock ([place])      ? Occipital neuralgia, unspecified laterality      ? Low back pain, unspecified back pain laterality, unspecified chronicity, unspecified whether sciatica present      ? Diffuse myofascial pain syndrome      ? Unsteady gait      ? Other chronic pain      ? Nonintractable headache, unspecified chronicity pattern, unspecified headache type      ? Cervicalgia      ? Degeneration of lumbar intervertebral disc      ? Degeneration of lumbar or lumbosacral intervertebral disc      ?  Hypertensive heart disease with congestive heart failure, unspecified heart failure type ([place])      ? Cardiac murmur      ? Atherosclerosis of aorta ([place])      ? Dizziness      ? Change of dressing      ? Presence of right artificial hip joint      ? Personal history of transient cerebral ischemia      ? ?  ? ? ? ?Lavon Paganini, MD   ?

## 2022-03-05 ENCOUNTER — Other Ambulatory Visit: Payer: Self-pay

## 2022-03-05 ENCOUNTER — Ambulatory Visit
Admission: RE | Admit: 2022-03-05 | Discharge: 2022-03-05 | Disposition: A | Discharge status: Home / Self Care | Payer: Medicare Other | Source: Ambulatory Visit | Attending: Oncology | Admitting: Oncology

## 2022-03-05 DIAGNOSIS — R768 Other specified abnormal immunological findings in serum: Secondary | ICD-10-CM

## 2022-03-05 DIAGNOSIS — N179 Acute kidney failure, unspecified: Secondary | ICD-10-CM | POA: Insufficient documentation

## 2022-03-05 DIAGNOSIS — L940 Localized scleroderma [morphea]: Secondary | ICD-10-CM | POA: Diagnosis not present

## 2022-03-05 DIAGNOSIS — D472 Monoclonal gammopathy: Secondary | ICD-10-CM | POA: Diagnosis not present

## 2022-03-05 DIAGNOSIS — I5033 Acute on chronic diastolic (congestive) heart failure: Secondary | ICD-10-CM | POA: Diagnosis not present

## 2022-03-06 ENCOUNTER — Telehealth: Payer: Self-pay

## 2022-03-06 DIAGNOSIS — N289 Disorder of kidney and ureter, unspecified: Secondary | ICD-10-CM

## 2022-03-06 LAB — BASIC METABOLIC PANEL
BUN/Creatinine Ratio: 22 (ref 12–28)
BUN: 39 mg/dL — ABNORMAL HIGH (ref 8–27)
CO2: 26 mmol/L (ref 20–29)
Calcium: 9.8 mg/dL (ref 8.7–10.3)
Chloride: 96 mmol/L (ref 96–106)
Creatinine, Ser: 1.75 mg/dL — ABNORMAL HIGH (ref 0.57–1.00)
Glucose: 111 mg/dL — ABNORMAL HIGH (ref 70–99)
Potassium: 4.2 mmol/L (ref 3.5–5.2)
Sodium: 137 mmol/L (ref 134–144)
eGFR: 28 mL/min/{1.73_m2} — ABNORMAL LOW (ref >59)

## 2022-03-06 NOTE — Telephone Encounter (Signed)
Patient's daughter in law [place] aware. On DPR. Labs placed up front. ?

## 2022-03-06 NOTE — Telephone Encounter (Signed)
-----   Message from Virginia Crews, MD sent at 03/06/2022 11:22 AM EDT ----- ?Stable labs in including potassium., but kidney function is slightly worse.  Recommend going back to home dose of lasix and recheck in 1 wk.  Go ahead and see nephrology as previously referred by specialist. ?

## 2022-03-07 ENCOUNTER — Telehealth: Payer: Self-pay | Admitting: *Deleted

## 2022-03-07 NOTE — Telephone Encounter (Signed)
I called pt.s daughter Helen Ramsey, I told her about the compression lumbar but no fracture. Wanted to make sure that if she is having pain will need to look into that. Helen Ramsey says that she has had low back issues for   a while and she sees pain doctor and her next appt is May 1. I told her from what we were looking for like little holes in bones - she did not have any and that was good news. . She thanked me for the call. ?===View-only below this line=== ?

## 2022-03-11 ENCOUNTER — Ambulatory Visit (INDEPENDENT_AMBULATORY_CARE_PROVIDER_SITE_OTHER): Payer: Medicare Other

## 2022-03-11 DIAGNOSIS — L89312 Pressure ulcer of right buttock, stage 2: Secondary | ICD-10-CM | POA: Diagnosis not present

## 2022-03-11 DIAGNOSIS — R2681 Unsteadiness on feet: Secondary | ICD-10-CM | POA: Diagnosis not present

## 2022-03-11 DIAGNOSIS — Z9181 History of falling: Secondary | ICD-10-CM

## 2022-03-11 DIAGNOSIS — L89322 Pressure ulcer of left buttock, stage 2: Secondary | ICD-10-CM | POA: Diagnosis not present

## 2022-03-11 DIAGNOSIS — M5481 Occipital neuralgia: Secondary | ICD-10-CM | POA: Diagnosis not present

## 2022-03-11 DIAGNOSIS — I5032 Chronic diastolic (congestive) heart failure: Secondary | ICD-10-CM

## 2022-03-11 DIAGNOSIS — M545 Low back pain, unspecified: Secondary | ICD-10-CM | POA: Diagnosis not present

## 2022-03-11 DIAGNOSIS — I110 Hypertensive heart disease with heart failure: Secondary | ICD-10-CM

## 2022-03-11 DIAGNOSIS — M7918 Myalgia, other site: Secondary | ICD-10-CM | POA: Diagnosis not present

## 2022-03-11 NOTE — Chronic Care Management (AMB) (Addendum)
Chronic Care Management   CCM RN Visit Note  03/11/2022 Name: Helen Ramsey MRN: [id number] DOB: 1933/08/15  Subjective: Helen Ramsey is a 88 y.o. year old female who is a primary care patient of Bacigalupo, [name], MD. The care management team was consulted for assistance with disease management and care coordination needs.    Engaged with patient's caregiver [name] by telephone for follow up visit in response to provider referral for case management and care coordination services.   Consent to Services:  The patient was given information about Chronic Care Management services, agreed to services, and gave verbal consent prior to initiation of services.  Please see initial visit note for detailed documentation.   Assessment: Review of patient past medical history, allergies, medications, health status, including review of consultants reports, laboratory and other test data, was performed as part of comprehensive evaluation and provision of chronic care management services.   SDOH (Social Determinants of Health) assessments and interventions performed: No  CCM Care Plan  Allergies  Allergen Reactions   Amoxicillin    Clindamycin/Lincomycin    Doxycycline    Levaquin [Levofloxacin In D5w]    Mirabegron Nausea And Vomiting   Nsaids Other (See Comments)    GI upset   Oxycodone Nausea Only    Other reaction(s): Hallucination   Phenergan [Promethazine Hcl]    Singulair [Montelukast]    Vicodin [Hydrocodone-Acetaminophen]    Zocor [Simvastatin]    Zoloft  [Sertraline] Other (See Comments)   Ampicillin Rash   Bactrim [Sulfamethoxazole-Trimethoprim] Rash   Penicillins Rash    Ampicillin, Clindamycin, Doxycycline Levaquin-severe headache, muscle and joint aches, weak   Tolmetin     Other reaction(s): Other (See Comments) GI upset    Outpatient Encounter Medications as of 03/11/2022  Medication Sig Note   acetaminophen (TYLENOL) 500 MG tablet Take 500 mg by mouth in the  morning and at bedtime.     ALPRAZolam (XANAX) 0.25 MG tablet TAKE 1 TABLET BY MOUTH TWICE DAILY AS NEEDED FOR ANXIETY    aspirin EC 81 MG tablet Take 81 mg by mouth daily.    atorvastatin (LIPITOR) 20 MG tablet TAKE 1 TABLET(20 MG) BY MOUTH DAILY    BREO ELLIPTA 100-25 MCG/ACT AEPB INHALE 1 PUFF INTO THE LUNGS DAILY    cetirizine (ZYRTEC) 10 MG tablet Take 10 mg daily by mouth.    Cholecalciferol (VITAMIN D3 PO) Take 1,000 Units by mouth.    citalopram (CELEXA) 40 MG tablet Take 1 tablet (40 mg total) by mouth daily.    cyanocobalamin 1000 MCG tablet Take 1,000 mcg by mouth daily.    Dentifrices (FLUORIDE TOOTHPASTE DT) Place onto teeth daily.     famotidine (PEPCID) 20 MG tablet TAKE 1 TABLET(20 MG) BY MOUTH DAILY    furosemide (LASIX) 80 MG tablet TAKE 1 TABLET(80 MG) BY MOUTH DAILY    gabapentin (NEURONTIN) 100 MG capsule Take 100 mg by mouth. Take 200 mg in morning and afternoon    gabapentin (NEURONTIN) 300 MG capsule Take 1 capsule (300 mg total) by mouth at bedtime.    Galcanezumab-gnlm (EMGALITY [place]) Inject 300 mg as directed every 30 (thirty) days.     hydroxychloroquine (PLAQUENIL) 200 MG tablet Take 200 mg by mouth daily.    levothyroxine (SYNTHROID) 50 MCG tablet TAKE 1 TABLET(50 MCG) BY MOUTH DAILY BEFORE BREAKFAST    lisinopril (ZESTRIL) 5 MG tablet Take 1 tablet (5 mg total) by mouth daily.    MAGNESIUM GLYCINATE PO Take 500 mg by  mouth daily.    melatonin 5 MG TABS Take 5 mg by mouth at bedtime as needed.    Omega-3 Fatty Acids (FISH OIL) 1200 MG CAPS Take 2 capsules by mouth daily.     omeprazole (PRILOSEC) 20 MG capsule TAKE 1 CAPSULE(20 MG) BY MOUTH DAILY    phenazopyridine (AZO-TABS) 95 MG tablet Take 1 tablet (95 mg total) by mouth as needed. (Patient taking differently: Take 95 mg by mouth 2 (two) times daily.) 05/16/2021: Patient reports that she takes twice a day   Polyethylene Glycol 3350 (MIRALAX PO) Take by mouth.    potassium gluconate 595 (99 K) MG TABS tablet Take  1 tablet (595 mg total) by mouth 2 (two) times daily.    sodium chloride (OCEAN) 0.65 % nasal spray Place 1 spray into the nose as needed. 11/29/2015: Received from: [place]   traMADol (ULTRAM) 50 MG tablet TAKE 1 TABLET BY MOUTH TWICE DAILY AS NEEDED (Patient taking differently: Take 50 mg by mouth 3 (three) times daily. TAKE 1 TABLET BY MOUTH 3 times daily)    triamcinolone cream (KENALOG) 0.1 % SMARTSIG:1 Application Topical 2-3 Times Daily    No facility-administered encounter medications on file as of 03/11/2022.    Patient Active Problem List   Diagnosis Date Noted   Pressure injury of left buttock, stage 2 ([place]) 02/14/2022   Rash and nonspecific skin eruption 12/06/2021   Tremor 09/12/2021   Acquired hypothyroidism 05/17/2021   (HFpEF) heart failure with preserved ejection fraction ([place]) 05/16/2021   History of dyspnea 01/26/2021   Recurrent major depressive disorder, in partial remission ([place]) 02/27/2020   Bilateral myofascial pain 06/01/2019   Bilateral occipital neuralgia 06/01/2019   Age-related osteoporosis without current pathological fracture 08/16/2018   Gait instability 08/16/2018   Dorsalgia 03/31/2016   H/O total knee replacement 02/14/2016   Vitamin D deficiency 11/29/2015   Allergy to other foods 11/29/2015   Knee pain, bilateral 08/29/2015   Sleep apnea 05/30/2015   Mixed hyperlipidemia 05/09/2015   Restless legs syndrome 05/09/2015   Allergic rhinitis 03/28/2015   Anemia 03/28/2015   Chronic headache 03/28/2015   DDD (degenerative disc disease), lumbar 03/28/2015   Gastro-esophageal reflux disease without esophagitis 03/28/2015   Essential (primary) hypertension 03/28/2015   Adaptive colitis 03/28/2015   OP (osteoporosis) 03/28/2015   Nasal septal perforation 03/28/2015   Spinal stenosis 03/28/2015   Gastric ulcer without hemorrhage or perforation 03/28/2015   Cerebral artery occlusion with cerebral infarction ([place]) 03/28/2015    Connective tissue and disc stenosis of intervertebral foramina of abdomen and other regions 03/28/2015   Diverticulosis of large intestine without perforation or abscess without bleeding 03/28/2015   Osteoarthritis 03/28/2015   Other specified functional intestinal disorders 03/28/2015   Hardening of the aorta (main artery of the heart) ([place]) 07/13/2014   H/O adenomatous polyp of colon 06/27/2005     Patient Care Plan: RN Care Management     Problem Identified: HF, HTN, HLD and Fall Risk      Long-Range Goal: Disease Progression Prevented or Minimized   Start Date: 02/14/2022  Expected End Date: 05/15/2022  Priority: High  Note:   Current Barriers:  Chronic Disease Management support and education needs related to CHF, HTN, HLD, and Fall Risk.  RNCM Clinical Goal(s):  Patient will demonstrate improved adherence to prescribed treatment plan for CHF, HTN, HLD and Fall Risk through collaboration with the provider, RN Care Manager and th care team.   Interventions: 1:1 collaboration with primary care  provider regarding development and update of comprehensive plan of care as evidenced by provider attestation and co-signature Inter-disciplinary care team collaboration (see longitudinal plan of care) Evaluation of current treatment plan related to  self management and patient's adherence to plan as established by provider   Heart Failure Interventions:  (Status:  Goal on track:  Yes.) Long Term Goal Discussed current plan for Heart Failure Management with patient's caregiver Reviewed weight parameters. Caregiver reports patient is weighing every other day. Reports weights have been within range.  Discussed s/sx of fluid overload. Per caregiver, patient has not experienced increased abdominal or lower extremity edema. No complaints of shortness of breath. Reports her activity tolerance has not declined.  Discussed nutritional intake. Reports patient is preparing small meals and monitoring  sodium intake.  Reviewed worsening s/sx related to CHF exacerbation and indications for seeking immediate medical attention.  Falls Interventions:  (Status:  Goal on Track.) Long Term Goal Reviewed information regarding safety and fall prevention. Per caregiver patient has not experienced falls since the last outreach. Reports she continues to utilize her walker as advised.  Reviewed patient's ability to perform ADL's and IADL's. Caregiver reports she continues to do well. The family hired a private care aide to assist with showers and housekeeping once a week. Family remains available to assist. [name] continues to serve as the primary caregiver. Agreed to update the care team with changes as needed.   Hypertension Interventions:  (Status: Goal on Track) Long Term Goal Reviewed plan for hypertension management. Caregiver reports patient has been compliant with medications and treatment recommendations. Reviewed BP readings. Caregiver reports BP is being monitored regularly as advised. Reports readings have been within range. Reviewed complications of uncontrolled blood pressure.  Reviewed s/sx of heart attack, stroke and worsening symptoms that require immediate medical attention.    Patient Goals/Self-Care Activities: Take all medications as prescribed Attend all scheduled provider appointments Call provider office for new concerns or questions     Follow Up Plan:   Will follow up within the next month       PLAN A member of the care management team will follow up within the next month.   [facility] Care Management [facility] [phone]

## 2022-03-12 DIAGNOSIS — N289 Disorder of kidney and ureter, unspecified: Secondary | ICD-10-CM | POA: Diagnosis not present

## 2022-03-13 LAB — COMPREHENSIVE METABOLIC PANEL
ALT: 21 IU/L (ref 0–32)
AST: 30 IU/L (ref 0–40)
Albumin/Globulin Ratio: 1.7 (ref 1.2–2.2)
Albumin: 4.2 g/dL (ref 3.6–4.6)
Alkaline Phosphatase: 65 IU/L (ref 44–121)
BUN/Creatinine Ratio: 17 (ref 12–28)
BUN: 26 mg/dL (ref 8–27)
Bilirubin Total: 0.4 mg/dL (ref 0.0–1.2)
CO2: 27 mmol/L (ref 20–29)
Calcium: 9.5 mg/dL (ref 8.7–10.3)
Chloride: 95 mmol/L — ABNORMAL LOW (ref 96–106)
Creatinine, Ser: 1.57 mg/dL — ABNORMAL HIGH (ref 0.57–1.00)
Globulin, Total: 2.5 g/dL (ref 1.5–4.5)
Glucose: 100 mg/dL — ABNORMAL HIGH (ref 70–99)
Potassium: 4.6 mmol/L (ref 3.5–5.2)
Sodium: 135 mmol/L (ref 134–144)
Total Protein: 6.7 g/dL (ref 6.0–8.5)
eGFR: 32 mL/min/{1.73_m2} — ABNORMAL LOW (ref >59)

## 2022-03-14 ENCOUNTER — Other Ambulatory Visit: Payer: Self-pay

## 2022-03-14 DIAGNOSIS — N289 Disorder of kidney and ureter, unspecified: Secondary | ICD-10-CM

## 2022-03-16 DIAGNOSIS — I110 Hypertensive heart disease with heart failure: Secondary | ICD-10-CM

## 2022-03-16 DIAGNOSIS — I5032 Chronic diastolic (congestive) heart failure: Secondary | ICD-10-CM

## 2022-03-17 DIAGNOSIS — M5136 Other intervertebral disc degeneration, lumbar region: Secondary | ICD-10-CM | POA: Diagnosis not present

## 2022-03-17 DIAGNOSIS — M5416 Radiculopathy, lumbar region: Secondary | ICD-10-CM | POA: Diagnosis not present

## 2022-03-17 DIAGNOSIS — M48062 Spinal stenosis, lumbar region with neurogenic claudication: Secondary | ICD-10-CM | POA: Diagnosis not present

## 2022-03-17 DIAGNOSIS — Z79899 Other long term (current) drug therapy: Secondary | ICD-10-CM | POA: Diagnosis not present

## 2022-03-18 DIAGNOSIS — M5481 Occipital neuralgia: Secondary | ICD-10-CM | POA: Diagnosis not present

## 2022-03-18 DIAGNOSIS — L89322 Pressure ulcer of left buttock, stage 2: Secondary | ICD-10-CM | POA: Diagnosis not present

## 2022-03-18 DIAGNOSIS — G8929 Other chronic pain: Secondary | ICD-10-CM | POA: Diagnosis not present

## 2022-03-18 DIAGNOSIS — M5136 Other intervertebral disc degeneration, lumbar region: Secondary | ICD-10-CM | POA: Diagnosis not present

## 2022-03-18 DIAGNOSIS — E782 Mixed hyperlipidemia: Secondary | ICD-10-CM | POA: Diagnosis not present

## 2022-03-18 DIAGNOSIS — R2681 Unsteadiness on feet: Secondary | ICD-10-CM | POA: Diagnosis not present

## 2022-03-18 DIAGNOSIS — I110 Hypertensive heart disease with heart failure: Secondary | ICD-10-CM | POA: Diagnosis not present

## 2022-03-18 DIAGNOSIS — F418 Other specified anxiety disorders: Secondary | ICD-10-CM | POA: Diagnosis not present

## 2022-03-18 DIAGNOSIS — M1990 Unspecified osteoarthritis, unspecified site: Secondary | ICD-10-CM | POA: Diagnosis not present

## 2022-03-18 DIAGNOSIS — G2581 Restless legs syndrome: Secondary | ICD-10-CM | POA: Diagnosis not present

## 2022-03-18 DIAGNOSIS — I5030 Unspecified diastolic (congestive) heart failure: Secondary | ICD-10-CM | POA: Diagnosis not present

## 2022-03-18 DIAGNOSIS — M545 Low back pain, unspecified: Secondary | ICD-10-CM | POA: Diagnosis not present

## 2022-03-18 DIAGNOSIS — M791 Myalgia, unspecified site: Secondary | ICD-10-CM | POA: Diagnosis not present

## 2022-03-18 DIAGNOSIS — E039 Hypothyroidism, unspecified: Secondary | ICD-10-CM | POA: Diagnosis not present

## 2022-03-18 DIAGNOSIS — M4800 Spinal stenosis, site unspecified: Secondary | ICD-10-CM | POA: Diagnosis not present

## 2022-03-18 DIAGNOSIS — K5730 Diverticulosis of large intestine without perforation or abscess without bleeding: Secondary | ICD-10-CM | POA: Diagnosis not present

## 2022-03-18 DIAGNOSIS — L89312 Pressure ulcer of right buttock, stage 2: Secondary | ICD-10-CM | POA: Diagnosis not present

## 2022-03-18 DIAGNOSIS — M5137 Other intervertebral disc degeneration, lumbosacral region: Secondary | ICD-10-CM | POA: Diagnosis not present

## 2022-03-18 DIAGNOSIS — M542 Cervicalgia: Secondary | ICD-10-CM | POA: Diagnosis not present

## 2022-03-18 DIAGNOSIS — R519 Headache, unspecified: Secondary | ICD-10-CM | POA: Diagnosis not present

## 2022-03-18 DIAGNOSIS — M7918 Myalgia, other site: Secondary | ICD-10-CM | POA: Diagnosis not present

## 2022-03-18 DIAGNOSIS — F3341 Major depressive disorder, recurrent, in partial remission: Secondary | ICD-10-CM | POA: Diagnosis not present

## 2022-03-18 DIAGNOSIS — F341 Dysthymic disorder: Secondary | ICD-10-CM | POA: Diagnosis not present

## 2022-03-18 DIAGNOSIS — M810 Age-related osteoporosis without current pathological fracture: Secondary | ICD-10-CM | POA: Diagnosis not present

## 2022-03-18 DIAGNOSIS — R011 Cardiac murmur, unspecified: Secondary | ICD-10-CM | POA: Diagnosis not present

## 2022-03-19 DIAGNOSIS — I509 Heart failure, unspecified: Secondary | ICD-10-CM | POA: Diagnosis not present

## 2022-03-19 DIAGNOSIS — R0602 Shortness of breath: Secondary | ICD-10-CM | POA: Diagnosis not present

## 2022-03-19 DIAGNOSIS — G4733 Obstructive sleep apnea (adult) (pediatric): Secondary | ICD-10-CM | POA: Diagnosis not present

## 2022-03-20 ENCOUNTER — Telehealth: Payer: Self-pay

## 2022-03-20 DIAGNOSIS — M5481 Occipital neuralgia: Secondary | ICD-10-CM | POA: Diagnosis not present

## 2022-03-20 DIAGNOSIS — M7918 Myalgia, other site: Secondary | ICD-10-CM | POA: Diagnosis not present

## 2022-03-20 DIAGNOSIS — R2681 Unsteadiness on feet: Secondary | ICD-10-CM | POA: Diagnosis not present

## 2022-03-20 DIAGNOSIS — L89322 Pressure ulcer of left buttock, stage 2: Secondary | ICD-10-CM | POA: Diagnosis not present

## 2022-03-20 DIAGNOSIS — L89312 Pressure ulcer of right buttock, stage 2: Secondary | ICD-10-CM | POA: Diagnosis not present

## 2022-03-20 DIAGNOSIS — M545 Low back pain, unspecified: Secondary | ICD-10-CM | POA: Diagnosis not present

## 2022-03-20 NOTE — Telephone Encounter (Signed)
Reached out to [facility] to check on pts referral and pt is scheduled on 04/15/2022. ?

## 2022-03-25 DIAGNOSIS — N289 Disorder of kidney and ureter, unspecified: Secondary | ICD-10-CM | POA: Diagnosis not present

## 2022-03-26 LAB — COMPREHENSIVE METABOLIC PANEL
ALT: 20 IU/L (ref 0–32)
AST: 24 IU/L (ref 0–40)
Albumin/Globulin Ratio: 1.7 (ref 1.2–2.2)
Albumin: 4.2 g/dL (ref 3.6–4.6)
Alkaline Phosphatase: 67 IU/L (ref 44–121)
BUN/Creatinine Ratio: 21 (ref 12–28)
BUN: 28 mg/dL — ABNORMAL HIGH (ref 8–27)
Bilirubin Total: 0.5 mg/dL (ref 0.0–1.2)
CO2: 26 mmol/L (ref 20–29)
Calcium: 9.4 mg/dL (ref 8.7–10.3)
Chloride: 96 mmol/L (ref 96–106)
Creatinine, Ser: 1.31 mg/dL — ABNORMAL HIGH (ref 0.57–1.00)
Globulin, Total: 2.5 g/dL (ref 1.5–4.5)
Glucose: 81 mg/dL (ref 70–99)
Potassium: 4.9 mmol/L (ref 3.5–5.2)
Sodium: 137 mmol/L (ref 134–144)
Total Protein: 6.7 g/dL (ref 6.0–8.5)
eGFR: 40 mL/min/{1.73_m2} — ABNORMAL LOW (ref >59)

## 2022-03-27 DIAGNOSIS — R0602 Shortness of breath: Secondary | ICD-10-CM | POA: Diagnosis not present

## 2022-03-28 ENCOUNTER — Ambulatory Visit: Payer: Medicare Other

## 2022-03-28 DIAGNOSIS — I5032 Chronic diastolic (congestive) heart failure: Secondary | ICD-10-CM

## 2022-03-28 NOTE — Chronic Care Management (AMB) (Signed)
  Chronic Care Management   CCM RN Visit Note  03/28/2022 Name: Helen Ramsey MRN: [id number] DOB: 1933-10-15  Subjective: Helen Ramsey is a 88 y.o. year old female who is a primary care patient of Bacigalupo, [name], MD. The care management team was consulted for assistance with disease management and care coordination needs.    Brief outreach with patient's caregiver, [name]. Outreach will be rescheduled for 04/01/22. [name] agreed to contact the clinic if urgent assistance is required before the rescheduled outreach.   PLAN: A member of the care management team will follow up next week.   [facility] Care Management [facility] [phone]

## 2022-04-01 ENCOUNTER — Ambulatory Visit (INDEPENDENT_AMBULATORY_CARE_PROVIDER_SITE_OTHER): Payer: Medicare Other

## 2022-04-01 DIAGNOSIS — L89322 Pressure ulcer of left buttock, stage 2: Secondary | ICD-10-CM

## 2022-04-01 DIAGNOSIS — I5032 Chronic diastolic (congestive) heart failure: Secondary | ICD-10-CM

## 2022-04-01 DIAGNOSIS — Z9181 History of falling: Secondary | ICD-10-CM

## 2022-04-02 ENCOUNTER — Ambulatory Visit: Payer: Self-pay

## 2022-04-02 DIAGNOSIS — I5032 Chronic diastolic (congestive) heart failure: Secondary | ICD-10-CM

## 2022-04-02 DIAGNOSIS — L89322 Pressure ulcer of left buttock, stage 2: Secondary | ICD-10-CM

## 2022-04-02 NOTE — Chronic Care Management (AMB) (Signed)
Chronic Care Management   CCM RN Visit Note  04/02/2022 Name: DANITRA PAYANO MRN: [id number] DOB: [birth year]  Subjective: MERSADEZ LINDEN is a 88 y.o. year old female who is a primary care patient of Bacigalupo, [name], MD. The care management team was consulted for assistance with disease management and care coordination needs.     Engaged with patient's caregiver by telephone for follow up visit in response to provider referral for case management and care coordination services.   Consent to Services:  The patient was given information about Chronic Care Management services, agreed to services, and gave verbal consent prior to initiation of services.  Please see initial visit note for detailed documentation.   Assessment: Review of patient past medical history, allergies, medications, health status, including review of consultants reports, laboratory and other test data, was performed as part of comprehensive evaluation and provision of chronic care management services.   SDOH (Social Determinants of Health) assessments and interventions performed: No  CCM Care Plan  Allergies  Allergen Reactions   Amoxicillin    Clindamycin/Lincomycin    Doxycycline    Levaquin [Levofloxacin In D5w]    Mirabegron Nausea And Vomiting   Nsaids Other (See Comments)    GI upset   Oxycodone Nausea Only    Other reaction(s): Hallucination   Phenergan [Promethazine Hcl]    Singulair [Montelukast]    Vicodin [Hydrocodone-Acetaminophen]    Zocor [Simvastatin]    Zoloft  [Sertraline] Other (See Comments)   Ampicillin Rash   Bactrim [Sulfamethoxazole-Trimethoprim] Rash   Penicillins Rash    Ampicillin, Clindamycin, Doxycycline Levaquin-severe headache, muscle and joint aches, weak   Tolmetin     Other reaction(s): Other (See Comments) GI upset    Outpatient Encounter Medications as of 04/02/2022  Medication Sig Note   acetaminophen (TYLENOL) 500 MG tablet Take 500 mg by mouth in the  morning and at bedtime.     ALPRAZolam (XANAX) 0.25 MG tablet TAKE 1 TABLET BY MOUTH TWICE DAILY AS NEEDED FOR ANXIETY    aspirin EC 81 MG tablet Take 81 mg by mouth daily.    atorvastatin (LIPITOR) 20 MG tablet TAKE 1 TABLET(20 MG) BY MOUTH DAILY    BREO ELLIPTA 100-25 MCG/ACT AEPB INHALE 1 PUFF INTO THE LUNGS DAILY    cetirizine (ZYRTEC) 10 MG tablet Take 10 mg daily by mouth.    Cholecalciferol (VITAMIN D3 PO) Take 1,000 Units by mouth.    citalopram (CELEXA) 40 MG tablet Take 1 tablet (40 mg total) by mouth daily.    cyanocobalamin 1000 MCG tablet Take 1,000 mcg by mouth daily.    Dentifrices (FLUORIDE TOOTHPASTE DT) Place onto teeth daily.     famotidine (PEPCID) 20 MG tablet TAKE 1 TABLET(20 MG) BY MOUTH DAILY    furosemide (LASIX) 80 MG tablet TAKE 1 TABLET(80 MG) BY MOUTH DAILY    gabapentin (NEURONTIN) 100 MG capsule Take 100 mg by mouth. Take 200 mg in morning and afternoon    gabapentin (NEURONTIN) 300 MG capsule Take 1 capsule (300 mg total) by mouth at bedtime.    Galcanezumab-gnlm (EMGALITY [place]) Inject 300 mg as directed every 30 (thirty) days.     hydroxychloroquine (PLAQUENIL) 200 MG tablet Take 200 mg by mouth daily.    levothyroxine (SYNTHROID) 50 MCG tablet TAKE 1 TABLET(50 MCG) BY MOUTH DAILY BEFORE BREAKFAST    lisinopril (ZESTRIL) 5 MG tablet Take 1 tablet (5 mg total) by mouth daily.    MAGNESIUM GLYCINATE PO Take 500 mg by  mouth daily.    melatonin 5 MG TABS Take 5 mg by mouth at bedtime as needed.    Omega-3 Fatty Acids (FISH OIL) 1200 MG CAPS Take 2 capsules by mouth daily.     omeprazole (PRILOSEC) 20 MG capsule TAKE 1 CAPSULE(20 MG) BY MOUTH DAILY    phenazopyridine (AZO-TABS) 95 MG tablet Take 1 tablet (95 mg total) by mouth as needed. (Patient taking differently: Take 95 mg by mouth 2 (two) times daily.) 05/16/2021: Patient reports that she takes twice a day   Polyethylene Glycol 3350 (MIRALAX PO) Take by mouth.    potassium gluconate 595 (99 K) MG TABS tablet Take  1 tablet (595 mg total) by mouth 2 (two) times daily.    sodium chloride (OCEAN) 0.65 % nasal spray Place 1 spray into the nose as needed. 11/29/2015: Received from: [employer]   traMADol (ULTRAM) 50 MG tablet TAKE 1 TABLET BY MOUTH TWICE DAILY AS NEEDED (Patient taking differently: Take 50 mg by mouth 3 (three) times daily. TAKE 1 TABLET BY MOUTH 3 times daily)    triamcinolone cream (KENALOG) 0.1 % SMARTSIG:1 Application Topical 2-3 Times Daily    No facility-administered encounter medications on file as of 04/02/2022.    Patient Active Problem List   Diagnosis Date Noted   Pressure injury of left buttock, stage 2 ([place]) 02/14/2022   Rash and nonspecific skin eruption 12/06/2021   Tremor 09/12/2021   Acquired hypothyroidism 05/17/2021   (HFpEF) heart failure with preserved ejection fraction ([place]) 05/16/2021   History of dyspnea 01/26/2021   Recurrent major depressive disorder, in partial remission ([place]) 02/27/2020   Bilateral myofascial pain 06/01/2019   Bilateral occipital neuralgia 06/01/2019   Age-related osteoporosis without current pathological fracture 08/16/2018   Gait instability 08/16/2018   Dorsalgia 03/31/2016   H/O total knee replacement 02/14/2016   Vitamin D deficiency 11/29/2015   Allergy to other foods 11/29/2015   Knee pain, bilateral 08/29/2015   Sleep apnea 05/30/2015   Mixed hyperlipidemia 05/09/2015   Restless legs syndrome 05/09/2015   Allergic rhinitis 03/28/2015   Anemia 03/28/2015   Chronic headache 03/28/2015   DDD (degenerative disc disease), lumbar 03/28/2015   Gastro-esophageal reflux disease without esophagitis 03/28/2015   Essential (primary) hypertension 03/28/2015   Adaptive colitis 03/28/2015   OP (osteoporosis) 03/28/2015   Nasal septal perforation 03/28/2015   Spinal stenosis 03/28/2015   Gastric ulcer without hemorrhage or perforation 03/28/2015   Cerebral artery occlusion with cerebral infarction ([place]) 03/28/2015    Connective tissue and disc stenosis of intervertebral foramina of abdomen and other regions 03/28/2015   Diverticulosis of large intestine without perforation or abscess without bleeding 03/28/2015   Osteoarthritis 03/28/2015   Other specified functional intestinal disorders 03/28/2015   Hardening of the aorta (main artery of the heart) ([place]) 07/13/2014   H/O adenomatous polyp of colon 06/27/2005   Patient Care Plan: RN Care Management     Problem Identified: HF, HTN, HLD and Fall Risk      Long-Range Goal: Disease Progression Prevented or Minimized   Start Date: 02/14/2022  Expected End Date: 05/15/2022  Priority: High  Note:   Current Barriers:  Chronic Disease Management support and education needs related to CHF, HTN, HLD, and Fall Risk.  RNCM Clinical Goal(s):  Patient will demonstrate improved adherence to prescribed treatment plan for CHF, HTN, HLD and Fall Risk through collaboration with the provider, RN Care Manager and th care team.   Interventions: 1:1 collaboration with primary care provider regarding  development and update of comprehensive plan of care as evidenced by provider attestation and co-signature Inter-disciplinary care team collaboration (see longitudinal plan of care) Evaluation of current treatment plan related to  self management and patient's adherence to plan as established by provider   Pressure Wound Interventions: Discussed plan for home health services and wound care. Caregiver reports patient resumed services with [facility] for wound care. Patient was previously being treated for a pressure wound to the left buttock. The wound healed but recently reopened. Per caregiver, the team was scheduled to change dressings once a week. Confirmed having additional 3x3 foam dressings, cleaning solution and extra bandages at the home to use if dressing requires changing prior to the [place] visit. Discussed importance of ensuring area remains clean  and dry as instructed.  Reviewed s/sx of wound infection. Thorough discussion regarding indications for seeking medical evaluation. Caregiver requested assistance with contacting [facility]. Reports being informed that the wound appeared to be healing well. Expressed concerns that wound was previously healed and reopened within a few days. She prefers not to discontinue services until the wound remains healed for a few weeks. I will contact the [facility] team to discuss plan. Update 04/02/22: Discussion with the [facility] team regarding plan for wound care. Per staff, patient will remain engaged. Reports she is scheduled for outreach this week. Reports the patient will be contacted the evening prior to the visit to confirm a time. Reports they will request an extension of services if she requires visit beyond the currently approved episode. Contacted patient's caregiver with planned update.    Patient Goals/Self-Care Activities: Take all medications as prescribed Attend all scheduled provider appointments Call provider office for new concerns or questions     Follow Up Plan:   Will follow up next week.      PLAN: A member of the care management team will follow up next week.   Cristy [facility] Care Management [facility] [phone]

## 2022-04-04 ENCOUNTER — Ambulatory Visit: Payer: Self-pay | Admitting: *Deleted

## 2022-04-04 DIAGNOSIS — R2681 Unsteadiness on feet: Secondary | ICD-10-CM | POA: Diagnosis not present

## 2022-04-04 DIAGNOSIS — L89322 Pressure ulcer of left buttock, stage 2: Secondary | ICD-10-CM | POA: Diagnosis not present

## 2022-04-04 DIAGNOSIS — M545 Low back pain, unspecified: Secondary | ICD-10-CM | POA: Diagnosis not present

## 2022-04-04 DIAGNOSIS — M7918 Myalgia, other site: Secondary | ICD-10-CM | POA: Diagnosis not present

## 2022-04-04 DIAGNOSIS — L89312 Pressure ulcer of right buttock, stage 2: Secondary | ICD-10-CM | POA: Diagnosis not present

## 2022-04-04 DIAGNOSIS — M5481 Occipital neuralgia: Secondary | ICD-10-CM | POA: Diagnosis not present

## 2022-04-04 NOTE — Telephone Encounter (Signed)
Reason for Disposition  [1] MODERATE pain (e.g., interferes with normal activities, limping) AND [2] present > 3 days  Answer Assessment - Initial Assessment Questions 1. REASON FOR CALL or QUESTION: "What is your reason for calling today?" or "How can I best help you?" or "What question do you have that I can help answer?"     Crystal Dunn with [facility]  Pt c/o pain in both lower legs, cool to touch, mild swelling ankles.  It's worse at night.  Answer Assessment - Initial Assessment Questions 1. LOCATION: "Which ankle is swollen?" "Where is the swelling?"     Her legs are cool to the touch and both ankles slightly swollen.   She is c/o a lot of pain in her legs at night.  Having trouble sleeping too.    Requesting an appt. 2. ONSET: "When did the swelling start?"     Romana Juniper, nurse with [facility] calling in report. 3. SIZE: "How large is the swelling?"     Both ankles slightly swollen 4. PAIN: "Is there any pain?" If Yes, ask: "How bad is it?" (Scale 1-10; or mild, moderate, severe)   - NONE (0): no pain.   - MILD (1-3): doesn't interfere with normal activities.    - MODERATE (4-7): interferes with normal activities (e.g., work or school) or awakens from sleep, limping.    - SEVERE (8-10): excruciating pain, unable to do any normal activities, unable to walk.      Yes severe in legs at night making it hard for her to sleep. 5. CAUSE: "What do you think caused the ankle swelling?"      6. OTHER SYMPTOMS: "Do you have any other symptoms?" (e.g., fever, chest pain, difficulty breathing, calf pain)     Trouble sleeping 7. PREGNANCY: "Is there any chance you are pregnant?" "When was your last menstrual period?"     N/A  Protocols used: Information Only Call - No Triage-A-AH, Ankle Swelling-A-AH  Chief Complaint: Romana Juniper, nurse with [facility] requesting an appt. For pt. Symptoms: both legs cool to the touch, very painful at night, both ankles  slight swollen.    Having trouble sleeping.  Frequency: at night legs are very painful Pertinent Negatives: Patient denies N/A Disposition: '[]'$ ED /'[]'$ Urgent Care (no appt availability in office) / '[x]'$ Appointment(In office/virtual)/ '[]'$  [facility]/ '[]'$ Home Care/ '[]'$ Refused Recommended Disposition /'[]'$ [facility]/ '[]'$  Follow-up with PCP Additional Notes: Appt made for Monday 04/07/2022 with Dr. Brita Romp at 1:00.

## 2022-04-07 ENCOUNTER — Ambulatory Visit: Payer: Medicare Other | Admitting: Family Medicine

## 2022-04-07 ENCOUNTER — Telehealth: Payer: Self-pay | Admitting: Family Medicine

## 2022-04-07 DIAGNOSIS — K219 Gastro-esophageal reflux disease without esophagitis: Secondary | ICD-10-CM

## 2022-04-07 NOTE — Progress Notes (Unsigned)
I,Helen Ramsey,acting as a Neurosurgeon for Helen Latch, MD.,have documented all relevant documentation on the behalf of Helen Latch, MD,as directed by  Helen Latch, MD while in the presence of Helen Latch, MD.  Established patient visit   Patient: Helen Ramsey   DOB: [birth year]   88 y.o. Female  MRN: [id number] Visit Date: 04/08/2022  Today's healthcare provider: Shirlee Latch, MD   No chief complaint on file.  Subjective    HPI  Patient here today C/O leg pain/swelling and feeling cold more at night.   Medications: Outpatient Medications Prior to Visit  Medication Sig   acetaminophen (TYLENOL) 500 MG tablet Take 500 mg by mouth in the morning and at bedtime.    ALPRAZolam (XANAX) 0.25 MG tablet TAKE 1 TABLET BY MOUTH TWICE DAILY AS NEEDED FOR ANXIETY   aspirin EC 81 MG tablet Take 81 mg by mouth daily.   atorvastatin (LIPITOR) 20 MG tablet TAKE 1 TABLET(20 MG) BY MOUTH DAILY   BREO ELLIPTA 100-25 MCG/ACT AEPB INHALE 1 PUFF INTO THE LUNGS DAILY   cetirizine (ZYRTEC) 10 MG tablet Take 10 mg daily by mouth.   Cholecalciferol (VITAMIN D3 PO) Take 1,000 Units by mouth.   citalopram (CELEXA) 40 MG tablet Take 1 tablet (40 mg total) by mouth daily.   cyanocobalamin 1000 MCG tablet Take 1,000 mcg by mouth daily.   Dentifrices (FLUORIDE TOOTHPASTE DT) Place onto teeth daily.    famotidine (PEPCID) 20 MG tablet TAKE 1 TABLET(20 MG) BY MOUTH DAILY   furosemide (LASIX) 80 MG tablet TAKE 1 TABLET(80 MG) BY MOUTH DAILY   gabapentin (NEURONTIN) 100 MG capsule Take 100 mg by mouth. Take 200 mg in morning and afternoon   gabapentin (NEURONTIN) 300 MG capsule Take 1 capsule (300 mg total) by mouth at bedtime.   Galcanezumab-gnlm (EMGALITY [place]) Inject 300 mg as directed every 30 (thirty) days.    hydroxychloroquine (PLAQUENIL) 200 MG tablet Take 200 mg by mouth daily.   levothyroxine (SYNTHROID) 50 MCG tablet TAKE 1 TABLET(50 MCG) BY MOUTH DAILY BEFORE BREAKFAST    lisinopril (ZESTRIL) 5 MG tablet Take 1 tablet (5 mg total) by mouth daily.   MAGNESIUM GLYCINATE PO Take 500 mg by mouth daily.   melatonin 5 MG TABS Take 5 mg by mouth at bedtime as needed.   Omega-3 Fatty Acids (FISH OIL) 1200 MG CAPS Take 2 capsules by mouth daily.    omeprazole (PRILOSEC) 20 MG capsule TAKE 1 CAPSULE(20 MG) BY MOUTH DAILY   phenazopyridine (AZO-TABS) 95 MG tablet Take 1 tablet (95 mg total) by mouth as needed. (Patient taking differently: Take 95 mg by mouth 2 (two) times daily.)   Polyethylene Glycol 3350 (MIRALAX PO) Take by mouth.   potassium gluconate 595 (99 K) MG TABS tablet Take 1 tablet (595 mg total) by mouth 2 (two) times daily.   sodium chloride (OCEAN) 0.65 % nasal spray Place 1 spray into the nose as needed.   traMADol (ULTRAM) 50 MG tablet TAKE 1 TABLET BY MOUTH TWICE DAILY AS NEEDED (Patient taking differently: Take 50 mg by mouth 3 (three) times daily. TAKE 1 TABLET BY MOUTH 3 times daily)   triamcinolone cream (KENALOG) 0.1 % SMARTSIG:1 Application Topical 2-3 Times Daily   No facility-administered medications prior to visit.    Review of Systems  Last CBC Lab Results  Component Value Date   WBC 6.5 02/07/2022   HGB 11.1 (L) 02/07/2022   HCT 34.1 (L) 02/07/2022  MCV 94.5 02/07/2022   MCH 30.7 02/07/2022   RDW 14.1 02/07/2022   PLT 170 02/07/2022   Last metabolic panel Lab Results  Component Value Date   GLUCOSE 81 03/25/2022   NA 137 03/25/2022   K 4.9 03/25/2022   CL 96 03/25/2022   CO2 26 03/25/2022   BUN 28 (H) 03/25/2022   CREATININE 1.31 (H) 03/25/2022   EGFR 40 (L) 03/25/2022   CALCIUM 9.4 03/25/2022   PROT 6.7 03/25/2022   ALBUMIN 4.2 03/25/2022   LABGLOB 2.5 03/25/2022   AGRATIO 1.7 03/25/2022   BILITOT 0.5 03/25/2022   ALKPHOS 67 03/25/2022   AST 24 03/25/2022   ALT 20 03/25/2022   ANIONGAP 8 02/07/2022       Objective    There were no vitals taken for this visit. BP Readings from Last 3 Encounters:  02/28/22  (!) 116/50  02/26/22 (!) 109/55  02/14/22 117/68   Wt Readings from Last 3 Encounters:  02/28/22 163 lb 4.8 oz (74.1 kg)  02/26/22 165 lb (74.8 kg)  02/14/22 159 lb 1.6 oz (72.2 kg)      Physical Exam  ***  No results found for any visits on 04/08/22.  Assessment & Plan     ***  No follow-ups on file.      {provider attestation***:1}   Helen Latch, MD  [facility] [phone] (phone) [phone] (fax)  [facility]

## 2022-04-07 NOTE — Telephone Encounter (Signed)
[facility] faxed refill request for the following medications:  omeprazole (PRILOSEC) 20 MG capsule     Please advise.  

## 2022-04-07 NOTE — Telephone Encounter (Signed)
Request for refill is too soon. Patient was prescribed qty of 90 with 1R in 01/21/2022

## 2022-04-08 ENCOUNTER — Encounter: Payer: Self-pay | Admitting: Family Medicine

## 2022-04-08 ENCOUNTER — Ambulatory Visit (INDEPENDENT_AMBULATORY_CARE_PROVIDER_SITE_OTHER): Payer: Medicare Other | Admitting: Family Medicine

## 2022-04-08 ENCOUNTER — Ambulatory Visit: Payer: Self-pay

## 2022-04-08 VITALS — BP 116/50 | HR 72 | Temp 98.7°F | Resp 16 | Wt 159.9 lb

## 2022-04-08 DIAGNOSIS — I5032 Chronic diastolic (congestive) heart failure: Secondary | ICD-10-CM | POA: Diagnosis not present

## 2022-04-08 DIAGNOSIS — M79662 Pain in left lower leg: Secondary | ICD-10-CM | POA: Diagnosis not present

## 2022-04-08 DIAGNOSIS — M79661 Pain in right lower leg: Secondary | ICD-10-CM | POA: Diagnosis not present

## 2022-04-08 DIAGNOSIS — Z9181 History of falling: Secondary | ICD-10-CM

## 2022-04-08 NOTE — Assessment & Plan Note (Signed)
New problem No further swelling from HF, fluid status is much improved She has good circulation in LEs and no claudication symptoms (occurs mostly at rest) Continue HH PT with calf stretching Seems to be calf tightness and strain of soleus muscle Advised heat prn

## 2022-04-08 NOTE — Assessment & Plan Note (Signed)
Euvolemic today Much better controlled now Does need med assistance with CCM pharmacist - referral placed today

## 2022-04-09 ENCOUNTER — Telehealth: Payer: Self-pay | Admitting: *Deleted

## 2022-04-09 NOTE — Chronic Care Management (AMB) (Signed)
  Chronic Care Management   Note  04/09/2022 Name: Helen Ramsey MRN: [id number] DOB: 04-Mar-1933  Helen Ramsey is a 88 y.o. year old female who is a primary care patient of Bacigalupo, [name], MD. I reached out to Redmond Pulling by phone today in response to a referral sent by Ms. Calhoun PCP.  Helen Ramsey was given information about Chronic Care Management services today including:  CCM service includes personalized support from designated clinical staff supervised by her physician, including individualized plan of care and coordination with other care providers 24/7 contact phone numbers for assistance for urgent and routine care needs. Service will only be billed when office clinical staff spend 20 minutes or more in a month to coordinate care. Only one practitioner may furnish and bill the service in a calendar month. The patient may stop CCM services at any time (effective at the end of the month) by phone call to the office staff. The patient is responsible for co-pay (up to 20% after annual deductible is met) if co-pay is required by the individual health plan.   Patient agreed to services and verbal consent obtained.   Follow up plan: Telephone appointment with care management team member scheduled for: 04/29/2022  Julian Hy, [place] Management  Direct Dial: [phone]

## 2022-04-11 DIAGNOSIS — L89312 Pressure ulcer of right buttock, stage 2: Secondary | ICD-10-CM | POA: Diagnosis not present

## 2022-04-11 DIAGNOSIS — R2681 Unsteadiness on feet: Secondary | ICD-10-CM | POA: Diagnosis not present

## 2022-04-11 DIAGNOSIS — M5481 Occipital neuralgia: Secondary | ICD-10-CM | POA: Diagnosis not present

## 2022-04-11 DIAGNOSIS — L89322 Pressure ulcer of left buttock, stage 2: Secondary | ICD-10-CM | POA: Diagnosis not present

## 2022-04-11 DIAGNOSIS — M545 Low back pain, unspecified: Secondary | ICD-10-CM | POA: Diagnosis not present

## 2022-04-11 DIAGNOSIS — R0602 Shortness of breath: Secondary | ICD-10-CM | POA: Diagnosis not present

## 2022-04-11 DIAGNOSIS — M7918 Myalgia, other site: Secondary | ICD-10-CM | POA: Diagnosis not present

## 2022-04-11 DIAGNOSIS — I509 Heart failure, unspecified: Secondary | ICD-10-CM | POA: Diagnosis not present

## 2022-04-15 DIAGNOSIS — N1832 Chronic kidney disease, stage 3b: Secondary | ICD-10-CM | POA: Insufficient documentation

## 2022-04-15 DIAGNOSIS — R82998 Other abnormal findings in urine: Secondary | ICD-10-CM | POA: Insufficient documentation

## 2022-04-16 ENCOUNTER — Other Ambulatory Visit: Payer: Self-pay

## 2022-04-16 DIAGNOSIS — M545 Low back pain, unspecified: Secondary | ICD-10-CM | POA: Diagnosis not present

## 2022-04-16 DIAGNOSIS — L89322 Pressure ulcer of left buttock, stage 2: Secondary | ICD-10-CM | POA: Diagnosis not present

## 2022-04-16 DIAGNOSIS — M7918 Myalgia, other site: Secondary | ICD-10-CM | POA: Diagnosis not present

## 2022-04-16 DIAGNOSIS — L89312 Pressure ulcer of right buttock, stage 2: Secondary | ICD-10-CM | POA: Diagnosis not present

## 2022-04-16 DIAGNOSIS — K219 Gastro-esophageal reflux disease without esophagitis: Secondary | ICD-10-CM

## 2022-04-16 DIAGNOSIS — I5032 Chronic diastolic (congestive) heart failure: Secondary | ICD-10-CM

## 2022-04-16 DIAGNOSIS — R2681 Unsteadiness on feet: Secondary | ICD-10-CM | POA: Diagnosis not present

## 2022-04-16 DIAGNOSIS — M5481 Occipital neuralgia: Secondary | ICD-10-CM | POA: Diagnosis not present

## 2022-04-16 NOTE — Telephone Encounter (Signed)
Pt called for a refill of Omeprazole 20 mg, last refill was 01/21/22 for 90, 1 refill. Pt called the pharmacy and they stated that they did not show any refills on file. I called the pharmacy to verify, they state they did not show any refills on that medication.  Requested Prescriptions   Pending Prescriptions Disp Refills   omeprazole (PRILOSEC) 20 MG capsule 90 capsule 1    Sig: TAKE 1 CAPSULE(20 MG) BY MOUTH DAILY    Sent to [facility] on [address]

## 2022-04-17 DIAGNOSIS — M4800 Spinal stenosis, site unspecified: Secondary | ICD-10-CM | POA: Diagnosis not present

## 2022-04-17 DIAGNOSIS — R2681 Unsteadiness on feet: Secondary | ICD-10-CM | POA: Diagnosis not present

## 2022-04-17 DIAGNOSIS — G2581 Restless legs syndrome: Secondary | ICD-10-CM | POA: Diagnosis not present

## 2022-04-17 DIAGNOSIS — F341 Dysthymic disorder: Secondary | ICD-10-CM | POA: Diagnosis not present

## 2022-04-17 DIAGNOSIS — F3341 Major depressive disorder, recurrent, in partial remission: Secondary | ICD-10-CM | POA: Diagnosis not present

## 2022-04-17 DIAGNOSIS — M1990 Unspecified osteoarthritis, unspecified site: Secondary | ICD-10-CM | POA: Diagnosis not present

## 2022-04-17 DIAGNOSIS — R011 Cardiac murmur, unspecified: Secondary | ICD-10-CM | POA: Diagnosis not present

## 2022-04-17 DIAGNOSIS — M810 Age-related osteoporosis without current pathological fracture: Secondary | ICD-10-CM | POA: Diagnosis not present

## 2022-04-17 DIAGNOSIS — E039 Hypothyroidism, unspecified: Secondary | ICD-10-CM | POA: Diagnosis not present

## 2022-04-17 DIAGNOSIS — M5481 Occipital neuralgia: Secondary | ICD-10-CM | POA: Diagnosis not present

## 2022-04-17 DIAGNOSIS — M5136 Other intervertebral disc degeneration, lumbar region: Secondary | ICD-10-CM | POA: Diagnosis not present

## 2022-04-17 DIAGNOSIS — M791 Myalgia, unspecified site: Secondary | ICD-10-CM | POA: Diagnosis not present

## 2022-04-17 DIAGNOSIS — I130 Hypertensive heart and chronic kidney disease with heart failure and stage 1 through stage 4 chronic kidney disease, or unspecified chronic kidney disease: Secondary | ICD-10-CM | POA: Diagnosis not present

## 2022-04-17 DIAGNOSIS — M5137 Other intervertebral disc degeneration, lumbosacral region: Secondary | ICD-10-CM | POA: Diagnosis not present

## 2022-04-17 DIAGNOSIS — M542 Cervicalgia: Secondary | ICD-10-CM | POA: Diagnosis not present

## 2022-04-17 DIAGNOSIS — K5730 Diverticulosis of large intestine without perforation or abscess without bleeding: Secondary | ICD-10-CM | POA: Diagnosis not present

## 2022-04-17 DIAGNOSIS — M7918 Myalgia, other site: Secondary | ICD-10-CM | POA: Diagnosis not present

## 2022-04-17 DIAGNOSIS — M545 Low back pain, unspecified: Secondary | ICD-10-CM | POA: Diagnosis not present

## 2022-04-17 DIAGNOSIS — F418 Other specified anxiety disorders: Secondary | ICD-10-CM | POA: Diagnosis not present

## 2022-04-17 DIAGNOSIS — I700 Atherosclerosis of aorta: Secondary | ICD-10-CM | POA: Diagnosis not present

## 2022-04-17 DIAGNOSIS — N1832 Chronic kidney disease, stage 3b: Secondary | ICD-10-CM | POA: Diagnosis not present

## 2022-04-17 DIAGNOSIS — R519 Headache, unspecified: Secondary | ICD-10-CM | POA: Diagnosis not present

## 2022-04-17 DIAGNOSIS — I5030 Unspecified diastolic (congestive) heart failure: Secondary | ICD-10-CM | POA: Diagnosis not present

## 2022-04-17 DIAGNOSIS — E782 Mixed hyperlipidemia: Secondary | ICD-10-CM | POA: Diagnosis not present

## 2022-04-17 DIAGNOSIS — G8929 Other chronic pain: Secondary | ICD-10-CM | POA: Diagnosis not present

## 2022-04-17 MED ORDER — OMEPRAZOLE 20 MG PO CPDR: DELAYED_RELEASE_CAPSULE | ORAL | 1 refills | Status: DC

## 2022-04-17 NOTE — Telephone Encounter (Signed)
Requested Prescriptions  Pending Prescriptions Disp Refills  . omeprazole (PRILOSEC) 20 MG capsule 90 capsule 1    Sig: TAKE 1 CAPSULE(20 MG) BY MOUTH DAILY     Gastroenterology: Proton Pump Inhibitors Passed - 04/16/2022  3:00 PM      Passed - Valid encounter within last 12 months    Recent Outpatient Visits          1 week ago Bilateral calf pain   [facility] [place], [name], MD   1 month ago Acute on chronic heart failure with preserved ejection fraction [facility])   [facility], [name], MD   2 months ago Pressure injury of left buttock, stage 2 [facility])   [facility], [name], MD   4 months ago Essential (primary) hypertension   [facility], [name], MD   7 months ago Fall, initial encounter   [facility], [name], MD      Future Appointments            In 1 month Bacigalupo, [name], MD [name] [place]

## 2022-04-18 ENCOUNTER — Telehealth: Payer: Self-pay | Admitting: Family Medicine

## 2022-04-18 NOTE — Telephone Encounter (Signed)
Helen Ramsey True calling from [facility] is calling to request approval to continue to follow the patient for CHF and New dx of chronic kidney disease. Frequency 1 w 9 for medication management CB- [phone] Or [phone]

## 2022-04-19 DIAGNOSIS — G4733 Obstructive sleep apnea (adult) (pediatric): Secondary | ICD-10-CM | POA: Diagnosis not present

## 2022-04-21 NOTE — Telephone Encounter (Signed)
OK for verbals 

## 2022-04-21 NOTE — Telephone Encounter (Signed)
Manuela Schwartz advised as below.

## 2022-04-24 DIAGNOSIS — M545 Low back pain, unspecified: Secondary | ICD-10-CM | POA: Diagnosis not present

## 2022-04-24 DIAGNOSIS — I5030 Unspecified diastolic (congestive) heart failure: Secondary | ICD-10-CM | POA: Diagnosis not present

## 2022-04-24 DIAGNOSIS — M7918 Myalgia, other site: Secondary | ICD-10-CM | POA: Diagnosis not present

## 2022-04-24 DIAGNOSIS — N1832 Chronic kidney disease, stage 3b: Secondary | ICD-10-CM | POA: Diagnosis not present

## 2022-04-24 DIAGNOSIS — I130 Hypertensive heart and chronic kidney disease with heart failure and stage 1 through stage 4 chronic kidney disease, or unspecified chronic kidney disease: Secondary | ICD-10-CM | POA: Diagnosis not present

## 2022-04-24 DIAGNOSIS — M5481 Occipital neuralgia: Secondary | ICD-10-CM | POA: Diagnosis not present

## 2022-04-28 ENCOUNTER — Telehealth: Payer: Self-pay

## 2022-04-28 DIAGNOSIS — R768 Other specified abnormal immunological findings in serum: Secondary | ICD-10-CM | POA: Diagnosis not present

## 2022-04-28 DIAGNOSIS — L940 Localized scleroderma [morphea]: Secondary | ICD-10-CM | POA: Diagnosis not present

## 2022-04-28 DIAGNOSIS — Z7960 Long term (current) use of unspecified immunomodulators and immunosuppressants: Secondary | ICD-10-CM | POA: Diagnosis not present

## 2022-04-28 NOTE — Progress Notes (Cosign Needed)
Chronic Care Management Pharmacy Assistant   Name: MARGARETTA CHITTUM  MRN: [id number] DOB: 1933/08/20  Reason for Encounter: Initial Assessment Questionnaire for visit with CPP on 04/29/2022 @ 0900   Conditions to be addressed/monitored: HTN, HLD, Depression, GERD, Hypothyroidism, Allergic Rhinitis, Osteoporosis, Osteoarthritis, and Hardening of the Aorta, Cerebral Artery Occlusion with Cerebral Infarction, Heart Failure with preserved ejection fraction, DDD, Anemia, Chronic Headache, Dorsalgia,   Primary concerns for visit include: I spoke to the patient and she is unsure of what the appointment is regarding as her daughter [name] takes care of everything. I did give [name] a call to remind her of the appointment and she stated no primary concerns just going over patient's medications.  Recent office visits:  04/08/2022 Lavon Paganini, MD (PCP Office Visit) for Edema- No medication changes noted, No orders placed,   03/03/2022 Lavon Paganini, MD (PCP Office Visit) for [place]- No medication changes noted, No orders placed, patient to follow-up in 2 months  02/28/2022 Lavon Paganini, MD (PCP Office Visit) for Edema- Patient instructed to increase her Lasix to 80 mg twice daily X 5 days, Lab order placed, Referral to Cardiology placed, No follow-up noted  02/14/2022 Lavon Paganini, MD (PCP Office Visit) for Wound Infection- Changed: Levothyroxine 25 mcg to 50 mcg 1 tablet daily, Referral to [place] placed, No follow-up noted  12/06/2021 Lavon Paganini, MD (PCP Office Visit) for Follow-up- No medication changes noted, Lab orders placed, Referral to Dermatology order placed, Referral for Home Health order placed, Patient to follow-up on 6 months  Recent consult visits:  04/15/2022 Murlean Iba, MD (Nephrology) for Initial Visit- Patient instructed to hold Lisinopril when using high doses of Lasix, No orders placed  03/27/2022 Wallene Huh, MD  (Pulmonary) for Initial Consult- No medication changes noted, Chest XR ordered, patient to follow-up in 10 weeks.  03/19/2022 Dwayne Aida Raider, MD (Cardiology) for Initial Consult- No medication changes noted, EKG order placed, patient to follow-up in 6 months  03/17/2022 Sabas Sous, MD (Physical Medicine) Unable to view this note  02/26/2022 Randa Evens, MD (Oncology) for MGUS- No medication changes noted, Lab orders placed, DG Bone survey Met order placed, Referral to Nephrology order placed, Patient to follow-up in 6 months  02/07/2022 Randa Evens, MD (Oncology) for MGUS- Stopped: Ketoconazole 2%, Lab orders placed, patient to follow-up in 2-3 weeks.  01/20/2022 Mayur Stefan Church, MD (Hematology) for Initial Consult- Started: Hydroxychloroquine 200 mg 1 table daily, No orders placed, patient to follow-up in 3 months  Hospital visits:  None in previous 6 months  Medications: Outpatient Encounter Medications as of 04/28/2022  Medication Sig Note   acetaminophen (TYLENOL) 500 MG tablet Take 500 mg by mouth in the morning and at bedtime.     ALPRAZolam (XANAX) 0.25 MG tablet TAKE 1 TABLET BY MOUTH TWICE DAILY AS NEEDED FOR ANXIETY    aspirin EC 81 MG tablet Take 81 mg by mouth daily.    atorvastatin (LIPITOR) 20 MG tablet TAKE 1 TABLET(20 MG) BY MOUTH DAILY    BREO ELLIPTA 100-25 MCG/ACT AEPB INHALE 1 PUFF INTO THE LUNGS DAILY    cetirizine (ZYRTEC) 10 MG tablet Take 10 mg daily by mouth.    Cholecalciferol (VITAMIN D3 PO) Take 1,000 Units by mouth.    citalopram (CELEXA) 40 MG tablet Take 1 tablet (40 mg total) by mouth daily.    cyanocobalamin 1000 MCG tablet Take 1,000 mcg by mouth daily.    Dentifrices (FLUORIDE TOOTHPASTE DT) Place onto teeth  daily.     famotidine (PEPCID) 20 MG tablet TAKE 1 TABLET(20 MG) BY MOUTH DAILY    furosemide (LASIX) 80 MG tablet TAKE 1 TABLET(80 MG) BY MOUTH DAILY    gabapentin (NEURONTIN) 100 MG capsule Take 100 mg by mouth. Take 200  mg in morning and afternoon    gabapentin (NEURONTIN) 300 MG capsule Take 1 capsule (300 mg total) by mouth at bedtime.    Galcanezumab-gnlm (EMGALITY [place]) Inject 300 mg as directed every 30 (thirty) days.     hydroxychloroquine (PLAQUENIL) 200 MG tablet Take 200 mg by mouth daily.    levothyroxine (SYNTHROID) 50 MCG tablet TAKE 1 TABLET(50 MCG) BY MOUTH DAILY BEFORE BREAKFAST    lisinopril (ZESTRIL) 5 MG tablet Take 1 tablet (5 mg total) by mouth daily.    MAGNESIUM GLYCINATE PO Take 500 mg by mouth daily.    melatonin 5 MG TABS Take 5 mg by mouth at bedtime as needed.    Omega-3 Fatty Acids (FISH OIL) 1200 MG CAPS Take 2 capsules by mouth daily.     omeprazole (PRILOSEC) 20 MG capsule TAKE 1 CAPSULE(20 MG) BY MOUTH DAILY    phenazopyridine (AZO-TABS) 95 MG tablet Take 1 tablet (95 mg total) by mouth as needed. (Patient taking differently: Take 95 mg by mouth 2 (two) times daily.) 05/16/2021: Patient reports that she takes twice a day   Polyethylene Glycol 3350 (MIRALAX PO) Take by mouth.    potassium gluconate 595 (99 K) MG TABS tablet Take 1 tablet (595 mg total) by mouth 2 (two) times daily.    sodium chloride (OCEAN) 0.65 % nasal spray Place 1 spray into the nose as needed. 11/29/2015: Received from: [facility]   traMADol (ULTRAM) 50 MG tablet TAKE 1 TABLET BY MOUTH TWICE DAILY AS NEEDED (Patient taking differently: Take 50 mg by mouth 3 (three) times daily. TAKE 1 TABLET BY MOUTH 3 times daily)    triamcinolone cream (KENALOG) 0.1 % 2 weeks on and off 2 weeks    No facility-administered encounter medications on file as of 04/28/2022.   Care Gaps: Zoster Vaccines COVID-19 Vaccine Booster 3 Dexa Scan  Star Rating Drugs: Atorvastatin 20 mg last filled on 03/24/2022 for a 90-Day supply with [facility] Lisinopril 5 mg last filled on 03/24/2022 for a 90-Day supply with [facility]  Questions for Clinical Pharmacist:   1.Are you able to connect with  Patient? Yes I spoke with the patient and I called her daughter [name] and confirmed appointment as well  2.Confirmed appointment date/time with patient/caregiver? Confirm appointment on 04/29/2022 at 0900 with Daron Offer CPP   3.Visit type telephone    4.Patient/Caregiver instructed to bring medications to appointment.    5.What, if any, problems do you have getting your medications from the pharmacy? None per patient her daughter picks up her medications from the pharmacy and the patient advised that she also has LIS in place.   6.What is your top health concern to discuss at your upcoming visit? Per the daughter [name] patient's medications    7.Have you seen any other providers since your last visit? Yes Rheumatology   Lynann Bologna, CPA/CMA Clinical Pharmacist Assistant Phone: [phone]

## 2022-04-29 ENCOUNTER — Ambulatory Visit (INDEPENDENT_AMBULATORY_CARE_PROVIDER_SITE_OTHER): Payer: Medicare Other

## 2022-04-29 DIAGNOSIS — E782 Mixed hyperlipidemia: Secondary | ICD-10-CM

## 2022-04-29 DIAGNOSIS — F3341 Major depressive disorder, recurrent, in partial remission: Secondary | ICD-10-CM

## 2022-04-29 DIAGNOSIS — K219 Gastro-esophageal reflux disease without esophagitis: Secondary | ICD-10-CM

## 2022-04-29 DIAGNOSIS — M810 Age-related osteoporosis without current pathological fracture: Secondary | ICD-10-CM

## 2022-04-29 DIAGNOSIS — I10 Essential (primary) hypertension: Secondary | ICD-10-CM

## 2022-04-29 DIAGNOSIS — I5032 Chronic diastolic (congestive) heart failure: Secondary | ICD-10-CM

## 2022-04-29 DIAGNOSIS — N1832 Chronic kidney disease, stage 3b: Secondary | ICD-10-CM

## 2022-04-29 DIAGNOSIS — E039 Hypothyroidism, unspecified: Secondary | ICD-10-CM

## 2022-04-29 NOTE — Progress Notes (Signed)
Chronic Care Management Pharmacy Note  05/06/2022 Name:  Helen Ramsey MRN:  [id number] DOB:  06/25/1933  Summary: Patient presents for initial CCM consult. Today's visit was conducted 100% with patient's daughter-in-law, [name]. Patient appears to be progressing and the family is discussing the need for placement in an assisted living facility. She would likely benefit from getting set up with adherence packaging, but daughter-in-law thinks it may be better to wait until decision is made regarding facility placement before deciding if her medications should be switched.   -History of L1 compression fracture seen on X-ray 03/05/22.   Recommendations/Changes made from today's visit: Continue current medications   -Recommend rechecking DEXA to evaluate progression of lumbar bone density, potential of restarting alendronate course.   Plan: CPP follow-up 3 months  Subjective: Helen Ramsey is an 88 y.o. year old female who is a primary patient of Bacigalupo, [name], MD.  The CCM team was consulted for assistance with disease management and care coordination needs.    Engaged with patient by telephone for initial visit in response to provider referral for pharmacy case management and/or care coordination services.   Consent to Services:  The patient was given the following information about Chronic Care Management services today, agreed to services, and gave verbal consent: 1. CCM service includes personalized support from designated clinical staff supervised by the primary care provider, including individualized plan of care and coordination with other care providers 2. 24/7 contact phone numbers for assistance for urgent and routine care needs. 3. Service will only be billed when office clinical staff spend 20 minutes or more in a month to coordinate care. 4. Only one practitioner may furnish and bill the service in a calendar month. 5.The patient may stop CCM services at any time  (effective at the end of the month) by phone call to the office staff. 6. The patient will be responsible for cost sharing (co-pay) of up to 20% of the service fee (after annual deductible is met). Patient agreed to services and consent obtained.  Patient Care Team: Virginia Crews, MD as PCP - General (Family Medicine) Vladimir Crofts, MD as Consulting Physician (Neurology) Sharlet Salina, MD as Referring Physician (Physical Medicine and Rehabilitation) Dingeldein, Remo Lipps, MD (Ophthalmology) Meeler, Sherren Kerns, FNP (Family Medicine) Neldon Labella, RN as Case Manager Posey Pronto, Mayur Raliegh Ip, MD as Consulting Physician (Rheumatology) Germaine Pomfret, [facility] (Pharmacist)  Recent office visits: 04/08/2022 Lavon Paganini, MD (PCP Office Visit) for Edema- No medication changes noted, No orders placed,    03/03/2022 Lavon Paganini, MD (PCP Office Visit) for [place]- No medication changes noted, No orders placed, patient to follow-up in 2 months   02/28/2022 Lavon Paganini, MD (PCP Office Visit) for Edema- Patient instructed to increase her Lasix to 80 mg twice daily X 5 days, Lab order placed, Referral to Cardiology placed, No follow-up noted   02/14/2022 Lavon Paganini, MD (PCP Office Visit) for Wound Infection- Changed: Levothyroxine 25 mcg to 50 mcg 1 tablet daily, Referral to [place] placed, No follow-up noted   12/06/2021 Lavon Paganini, MD (PCP Office Visit) for Follow-up- No medication changes noted, Lab orders placed, Referral to Dermatology order placed, Referral for Home Health order placed, Patient to follow-up on 6 months  Recent consult visits: 04/28/22: Patient presented to Dr. Posey Pronto (Rheumatology). Prednisone, folic acid, methotrexate 2.5 mg, hydroxychloroquine   04/15/2022 Murlean Iba, MD (Nephrology) for Initial Visit- Patient instructed to hold Lisinopril when using high doses of Lasix, No orders  placed   03/27/2022 Wallene Huh, MD  (Pulmonary) for Initial Consult- No medication changes noted, Chest XR ordered, patient to follow-up in 10 weeks.   03/19/2022 Dwayne Aida Raider, MD (Cardiology) for Initial Consult- No medication changes noted, EKG order placed, patient to follow-up in 6 months   03/17/2022 Sabas Sous, MD (Physical Medicine) Unable to view this note   02/26/2022 Randa Evens, MD (Oncology) for MGUS- No medication changes noted, Lab orders placed, DG Bone survey Met order placed, Referral to Nephrology order placed, Patient to follow-up in 6 months   02/07/2022 Randa Evens, MD (Oncology) for MGUS- Stopped: Ketoconazole 2%, Lab orders placed, patient to follow-up in 2-3 weeks.   01/20/2022 Mayur Stefan Church, MD (Hematology) for Initial Consult- Started: Hydroxychloroquine 200 mg 1 table daily, No orders placed, patient to follow-up in 3 months  Hospital visits: None in previous 6 months   Objective:  Lab Results  Component Value Date   CREATININE 1.31 (H) 03/25/2022   BUN 28 (H) 03/25/2022   EGFR 40 (L) 03/25/2022   GFRNONAA 33 (L) 02/07/2022   GFRAA 74 12/20/2020   NA 137 03/25/2022   K 4.9 03/25/2022   CALCIUM 9.4 03/25/2022   CO2 26 03/25/2022   GLUCOSE 81 03/25/2022    Lab Results  Component Value Date/Time   HGBA1C 5.1 02/27/2020 10:28 AM   HGBA1C 5.8 (H) 12/27/2018 10:37 AM    Last diabetic Eye exam: No results found for: "HMDIABEYEEXA"  Last diabetic Foot exam: No results found for: "HMDIABFOOTEX"   Lab Results  Component Value Date   CHOL 182 08/15/2021   HDL 28 (L) 08/15/2021   LDLCALC 93 08/15/2021   TRIG 367 (H) 08/15/2021   CHOLHDL 6.5 (H) 08/15/2021       Latest Ref Rng & Units 03/25/2022   10:10 AM 03/12/2022    9:01 AM 02/07/2022   11:13 AM  Hepatic Function  Total Protein 6.0 - 8.5 g/dL 6.7  6.7  7.2   Albumin 3.6 - 4.6 g/dL 4.2  4.2  3.6   AST 0 - 40 IU/L _0 ALT 0 - 32 IU/L _1 Alk Phosphatase 44 - 121 IU/L 67  65  62    Total Bilirubin 0.0 - 1.2 mg/dL 0.5  0.4  0.6     Lab Results  Component Value Date/Time   TSH 3.350 12/06/2021 11:04 AM   TSH 6.610 (H) 08/15/2021 02:26 PM       Latest Ref Rng & Units 02/07/2022   11:13 AM 12/06/2021   11:04 AM 08/15/2021    2:26 PM  CBC  WBC 4.0 - 10.5 K/uL 6.5  7.1  6.7   Hemoglobin 12.0 - 15.0 g/dL 11.1  11.2  10.9   Hematocrit 36.0 - 46.0 % 34.1  32.8  32.2   Platelets 150 - 400 K/uL 170  273  313     Lab Results  Component Value Date/Time   VD25OH 38.6 02/27/2020 10:28 AM   VD25OH 39.5 12/27/2018 10:37 AM    Clinical ASCVD: Yes  The ASCVD Risk score (Arnett DK, et al., 2019) failed to calculate for the following reasons:   The 2019 ASCVD risk score is only valid for ages 52 to 23   The patient has a prior MI or stroke diagnosis       05/06/2022   10:22 AM 04/08/2022   10:35 AM 02/14/2022   10:31 AM  Depression  screen PHQ 2/9  Decreased Interest 0 1 0  Down, Depressed, Hopeless 0 1 0  PHQ - 2 Score 0 2 0  Altered sleeping 0 2 2  Tired, decreased energy 0 3 2  Change in appetite 0 2 0  Feeling bad or failure about yourself  0 0 0  Trouble concentrating 0 0 0  Moving slowly or fidgety/restless 0 1 0  Suicidal thoughts 0 0 0  PHQ-9 Score 0 10 4  Difficult doing work/chores Not difficult at all Somewhat difficult Not difficult at all    -Last DEXA Scan: 01/23/20   T-Score femoral neck: -0.4  T-Score total hip: -1.0  T-Score lumbar spine: -2.1  T-Score forearm radius: -2.7  Social History   Tobacco Use  Smoking Status Never  Smokeless Tobacco Never   BP Readings from Last 3 Encounters:  04/08/22 (!) 116/50  02/28/22 (!) 116/50  02/26/22 (!) 109/55   Pulse Readings from Last 3 Encounters:  04/08/22 72  02/28/22 61  02/26/22 71   Wt Readings from Last 3 Encounters:  05/06/22 159 lb (72.1 kg)  04/08/22 159 lb 14.4 oz (72.5 kg)  02/28/22 163 lb 4.8 oz (74.1 kg)   BMI Readings from Last 3 Encounters:  05/06/22 32.39 kg/m   04/08/22 32.57 kg/m  02/28/22 33.26 kg/m    Assessment/Interventions: Review of patient past medical history, allergies, medications, health status, including review of consultants reports, laboratory and other test data, was performed as part of comprehensive evaluation and provision of chronic care management services.   SDOH:  (Social Determinants of Health) assessments and interventions performed: Yes  SDOH Screenings   Alcohol Screen: Low Risk  (05/06/2022)   Alcohol Screen    Last Alcohol Screening Score (AUDIT): 0  Depression (PHQ2-9): Low Risk  (05/06/2022)   Depression (PHQ2-9)    PHQ-2 Score: 0  Recent Concern: Depression (PHQ2-9) - Medium Risk (04/08/2022)   Depression (PHQ2-9)    PHQ-2 Score: 10  Financial Resource Strain: Low Risk  (05/06/2022)   Overall Financial Resource Strain (CARDIA)    Difficulty of Paying Living Expenses: Not hard at all  Food Insecurity: No Food Insecurity (05/06/2022)   Hunger Vital Sign    Worried About Running Out of Food in the Last Year: Never true    Ran Out of Food in the Last Year: Never true  Housing: Low Risk  (05/06/2022)   Housing    Last Housing Risk Score: 0  Physical Activity: Insufficiently Active (05/06/2022)   Exercise Vital Sign    Days of Exercise per Week: 2 days    Minutes of Exercise per Session: 20 min  Social Connections: Moderately Isolated (05/06/2022)   Social Connection and Isolation Panel [NHANES]    Frequency of Communication with Friends and Family: More than three times a week    Frequency of Social Gatherings with Friends and Family: Once a week    Attends Religious Services: More than 4 times per year    Active Member of [employer] or Organizations: No    Attends Archivist Meetings: Never    Marital Status: Widowed  Stress: No Stress Concern Present (05/06/2022)   [place]    Feeling of Stress : Not at all  Tobacco Use: Low Risk   (05/06/2022)   Patient History    Smoking Tobacco Use: Never    Smokeless Tobacco Use: Never    Passive Exposure: Not on file  Transportation Needs: No Transportation  Needs (05/06/2022)   PRAPARE - Hydrologist (Medical): No    Lack of Transportation (Non-Medical): No    CCM Care Plan  Allergies  Allergen Reactions   Amoxicillin    Clindamycin/Lincomycin    Doxycycline    Levaquin [Levofloxacin In D5w]    Mirabegron Nausea And Vomiting   Nsaids Other (See Comments)    GI upset   Oxycodone Nausea Only    Other reaction(s): Hallucination   Phenergan [Promethazine Hcl]    Singulair [Montelukast]    Vicodin [Hydrocodone-Acetaminophen]    Zocor [Simvastatin]    Zoloft  [Sertraline] Other (See Comments)   Ampicillin Rash   Bactrim [Sulfamethoxazole-Trimethoprim] Rash   Levofloxacin Other (See Comments)    Weakness   Penicillins Rash    Ampicillin, Clindamycin, Doxycycline Levaquin-severe headache, muscle and joint aches, weak   Tolmetin     Other reaction(s): Other (See Comments) GI upset    Medications Reviewed Today     Reviewed by Dionisio David, LPN (Licensed Practical Nurse) on 05/06/22 at 1032  Med List Status: <None>   Medication Order Taking? Sig Documenting Provider Last Dose Status Informant  acetaminophen (TYLENOL) 500 MG tablet [id number] Yes Take 500 mg by mouth in the morning and at bedtime.  [provider] Taking Active Self  ALPRAZolam Duanne Moron) 0.25 MG tablet [id number] Yes TAKE 1 TABLET BY MOUTH TWICE DAILY AS NEEDED FOR ANXIETY Mecum, Erin E, PA-C Taking Active   aspirin EC 81 MG tablet [id number] Yes Take 81 mg by mouth daily. [provider] Taking Active   atorvastatin (LIPITOR) 20 MG tablet [id number] Yes TAKE 1 TABLET(20 MG) BY MOUTH DAILY Gwyneth Sprout, FNP Taking Active   BREO ELLIPTA 100-25 MCG/ACT AEPB [id number] Yes INHALE 1 PUFF INTO THE LUNGS DAILY Virginia Crews, MD Taking Active   Calcium  Carb-Cholecalciferol 600-25 MG-MCG CAPS [id number] No Take by mouth.  Patient not taking: Reported on 05/06/2022   [provider] Not Taking Active   cetirizine (ZYRTEC) 10 MG tablet [id number] Yes 1 tablet 1 (one) time each day [provider] Taking Active   Cholecalciferol (VITAMIN D3 PO) [id number] Yes Take 1,000 Units by mouth. [provider] Taking Active   Cholecalciferol 25 MCG (1000 UT) tablet [id number] Yes Take by mouth. [provider] Taking Active   citalopram (CELEXA) 40 MG tablet [id number] Yes Take 1 tablet (40 mg total) by mouth daily. Virginia Crews, MD Taking Active   cyanocobalamin 1000 MCG tablet [id number] Yes Take 1,000 mcg by mouth daily. [provider] Taking Active   Dentifrices (FLUORIDE TOOTHPASTE DT) [id number] Yes Place onto teeth daily.  [provider] Taking Active   diphenhydrAMINE (BENADRYL) 25 MG tablet [id number] Yes Take 25 mg by mouth at bedtime. [provider] Taking Active   diphenhydrAMINE (SOMINEX) 25 MG tablet [id number] No Take by mouth.  Patient not taking: Reported on 05/06/2022   [provider] Not Taking Active   EMGALITY, 300 MG DOSE, 100 MG/ML SOSY [id number] Yes SMARTSIG:300 Milligram(s) SUB-Q Once a Month [provider] Taking Active   famotidine (PEPCID) 20 MG tablet [id number] Yes TAKE 1 TABLET(20 MG) BY MOUTH DAILY Bacigalupo, [name], MD Taking Active   ferrous sulfate 325 (65 FE) MG tablet [id number] Yes Take by mouth. [provider] Taking Active   fluticasone furoate-vilanterol (BREO ELLIPTA) 100-25 MCG/ACT AEPB [id number] Yes 1 puff 1 (one) time each day [provider] Taking Active   folic acid (FOLVITE)  1 MG tablet [id number] Yes Take 1 mg by mouth daily. [provider] Taking Active   furosemide (LASIX) 80 MG tablet [id number] Yes TAKE 1 TABLET(80 MG) BY MOUTH DAILY Bacigalupo, [name], MD Taking Active   gabapentin (NEURONTIN)  100 MG capsule [id number] Yes Take 100 mg by mouth. Take 200 mg in morning and afternoon [provider] Taking Active   gabapentin (NEURONTIN) 300 MG capsule [id number] Yes Take 1 capsule (300 mg total) by mouth at bedtime. Virginia Crews, MD Taking Active   Galcanezumab-gnlm [facility]) [id number] Yes Inject 300 mg as directed every 30 (thirty) days.  [provider] Taking Active   Galcanezumab-gnlm (EMGALITY, 300 MG DOSE,) 100 MG/ML SOSY [id number] Yes Inject into the skin. [provider] Taking Active   hydroxychloroquine (PLAQUENIL) 200 MG tablet [id number] Yes Take 200 mg by mouth daily. [provider] Taking Active   levothyroxine (SYNTHROID) 50 MCG tablet [id number] Yes TAKE 1 TABLET(50 MCG) BY MOUTH DAILY BEFORE BREAKFAST Bacigalupo, [name], MD Taking Active   lisinopril (ZESTRIL) 5 MG tablet [id number] Yes Take 1 tablet (5 mg total) by mouth daily. Virginia Crews, MD Taking Active   MAGNESIUM GLYCINATE PO [id number] Yes Take 400 mg by mouth daily. [provider] Taking Active   magnesium hydroxide (MILK OF MAGNESIA) 400 MG/5ML suspension [id number] Yes Take 5 mLs by mouth daily as needed for mild constipation. [provider] Taking Active   melatonin 5 MG TABS [id number] Yes Take 5 mg by mouth at bedtime as needed. [provider] Taking Active   methotrexate (RHEUMATREX) 2.5 MG tablet [id number] Yes Take 10 mg by mouth once a week. [provider] Taking Active   Omega-3 Fatty Acids (FISH OIL) 1000 MG CAPS [id number] Yes Take by mouth. [provider] Taking Active   omeprazole (PRILOSEC) 20 MG capsule [id number] Yes TAKE 1 CAPSULE(20 MG) BY MOUTH DAILY Bacigalupo, [name], MD Taking Active   phenazopyridine (AZO-TABS) 95 MG tablet [id number] Yes Take 1 tablet (95 mg total) by mouth as needed.  Patient taking differently: Take 95 mg by mouth 2 (two) times daily.   Mar Daring, PA-C Taking  Active            Med Note Nestor Ramp May 16, 2021  1:16 PM) Patient reports that she takes twice a day  Polyethylene Glycol 3350 (MIRALAX PO) [id number] Yes Take by mouth. [provider] Taking Active   Potassium Gluconate 2.5 MEQ TABS [id number] Yes 99 mg 2 (two) times a day [provider] Taking Active   potassium gluconate 595 (99 K) MG TABS tablet [id number] Yes Take 1 tablet (595 mg total) by mouth 2 (two) times daily. Mar Daring, PA-C Taking Active   predniSONE (DELTASONE) 5 MG tablet [id number] No Take by mouth.  Patient not taking: Reported on 05/06/2022   [provider] Not Taking Active   predniSONE (DELTASONE) 5 MG tablet [id number] No Take by mouth.  Patient not taking: Reported on 05/06/2022   [provider] Not Taking Active   sodium chloride (OCEAN) 0.65 % nasal spray [id number] Yes Place 1 spray into the nose as needed. [provider] Taking Active            Med Note Donnetta Simpers, Yolanda Manges Nov 29, 2015 10:58 AM) Received from: [facility]  traMADol (ULTRAM) 50 MG tablet [id number] Yes TAKE 1 TABLET BY MOUTH TWICE DAILY AS NEEDED  Patient taking differently: Take 50 mg by mouth 3 (three) times daily. TAKE 1 TABLET BY MOUTH 3 times daily   Virginia Crews, MD Taking Active Self, Family Member  triamcinolone cream (KENALOG) 0.1 % [id number] Yes 2 weeks on and off 2 weeks [provider] Taking Active             Patient Active Problem List   Diagnosis Date Noted   ANA positive 04/28/2022   Morphea 04/28/2022   Other abnormal findings in urine 04/15/2022   Stage 3b chronic kidney disease ([place]) 04/15/2022   Bilateral calf pain 04/08/2022   Pressure injury of left buttock, stage 2 ([place]) 02/14/2022   Rash and nonspecific skin eruption 12/06/2021   Tremor 09/12/2021   Acquired hypothyroidism 05/17/2021   (HFpEF) heart failure with preserved ejection fraction ([place])  05/16/2021   History of dyspnea 01/26/2021   Recurrent major depressive disorder, in partial remission ([place]) 02/27/2020   Bilateral myofascial pain 06/01/2019   Bilateral occipital neuralgia 06/01/2019   Age-related osteoporosis without current pathological fracture 08/16/2018   Gait instability 08/16/2018   Dorsalgia 03/31/2016   H/O total knee replacement 02/14/2016   Vitamin D deficiency 11/29/2015   Allergy to other foods 11/29/2015   Knee pain, bilateral 08/29/2015   Sleep apnea 05/30/2015   Mixed hyperlipidemia 05/09/2015   Restless legs syndrome 05/09/2015   Allergic rhinitis 03/28/2015   Anemia 03/28/2015   Chronic headache 03/28/2015   DDD (degenerative disc disease), lumbar 03/28/2015   Gastro-esophageal reflux disease without esophagitis 03/28/2015   Essential (primary) hypertension 03/28/2015   Adaptive colitis 03/28/2015   OP (osteoporosis) 03/28/2015   Nasal septal perforation 03/28/2015   Spinal stenosis 03/28/2015   Gastric ulcer without hemorrhage or perforation 03/28/2015   Cerebral artery occlusion with cerebral infarction ([place]) 03/28/2015   Connective tissue and disc stenosis of intervertebral foramina of abdomen and other regions 03/28/2015   Diverticulosis of large intestine without perforation or abscess without bleeding 03/28/2015   Osteoarthritis 03/28/2015   Other specified functional intestinal disorders 03/28/2015   Hardening of the aorta (main artery of the heart) ([place]) 07/13/2014   H/O adenomatous polyp of colon 06/27/2005    Immunization History  Administered Date(s) Administered   Fluad Quad(high Dose 65+) 08/05/2019, 08/15/2021   Influenza, High Dose Seasonal PF 08/29/2015, 09/01/2016, 07/28/2017, 08/16/2018   PFIZER(Purple Top)SARS-COV-2 Vaccination 12/08/2019, 12/29/2019   Pneumococcal Conjugate-13 08/29/2015   Pneumococcal Polysaccharide-23 07/28/2017   Tdap 08/26/2021    Conditions to be addressed/monitored:  Hypertension,  Hyperlipidemia, Heart Failure, GERD, Hypothyroidism, Depression, Osteoporosis, and Osteoarthritis  Care Plan : General Pharmacy (Adult)  Updates made by Germaine Pomfret, RPH since 05/06/2022 12:00 AM     Problem: Hypertension, Hyperlipidemia, Heart Failure, GERD, Hypothyroidism, Depression, Osteoporosis, and Osteoarthritis   Priority: High     Long-Range Goal: Patient-Specific Goal   Start Date: 05/06/2022  Expected End Date: 05/07/2023  This Visit's Progress: On track  Priority: High  Note:   Current Barriers:  Unable to self administer medications as prescribed  Pharmacist Clinical Goal(s):  Patient will maintain control of heart failure as evidenced by BP less than 140/90  through collaboration with PharmD and provider.   Interventions: 1:1 collaboration with Brita Romp, [name], MD regarding development and update of comprehensive plan of care as evidenced by provider attestation and co-signature Inter-disciplinary care team collaboration (see longitudinal plan of care) Comprehensive medication review performed; medication list updated in electronic medical record  Heart Failure (Goal: manage symptoms and prevent exacerbations) -Controlled -  Last ejection fraction: 55-60% (Date: 03/18/21) -HF type: Diastolic -Current treatment: Furosemide 80 mg daily  Lisinopril 5 mg daily  -Medications previously tried: NA  -Current home BP/HR readings: 112/62 -Recommended to continue current medication  Hyperlipidemia: (LDL goal < 70) -Uncontrolled -Current treatment: Atorvastatin 20 mg daily  -Current antiplatelet treatment: Aspirin 81 mg daily  -Medications previously tried: NA  -Recommended to continue current medication  Depression/Anxiety (Goal: Achieve symptom remission) -Not ideally controlled -Current treatment: Alprazolam 0.25 mg twice daily as needed  Citalopram 40 mg daily  -Medications previously tried/failed: NA -Alprazolam filled end of January, using  sparingly -PHQ9: 10 -GAD7: 0 -Recommended to continue current medication  Osteoporosis (Goal Prevent fractures) -Uncontrolled -Patient is a candidate for pharmacologic treatment due to history of vertebral fracture -Current treatment  Vitamin D3 1000 units daily  Calcium 600 mg daily  -Medications previously tried: Alendronate (completed 5 yr course in 2019)  -Recommend rechecking DEXA to evaluate progression of lumbar bone density, potential of restarting alendronate course.  -Recommended to continue current medication  GERD (Goal: Prevent reflux) -Controlled -Current treatment  Famotidine 20 mg daily  Omeprazole 20 mg daily  -Medications previously tried: NA  -Recommended to continue current medication  Hypothyroidism (Goal: Maintain thyroid function) -Controlled -Current treatment  Levothyroxine 50 mcg daily  -Medications previously tried: NA  -Recommended to continue current medication  Chronic Pain (Goal: Maintain adequate pain control and quality of life) -Controlled -Current treatment  Gabapentin 200 mg AM, 200 mg PM, 300 mg nightly  Tramadol 50 mg twice daily as needed  -Medications previously tried: NA  -Recommended to continue current medication  Chronic Kidney Disease Stage 3b  -All medications assessed for renal dosing and appropriateness in chronic kidney disease. -Recommended to continue current medication  Patient Goals/Self-Care Activities Patient will:  - check blood pressure 2-3 times weekly, document, and provide at future appointments weigh daily, and contact provider if weight gain of greater than 2 pounds in 24 hours   Follow Up Plan: Telephone follow up appointment with care management team member scheduled for:  07/28/2022 at 1:00 PM      Medication Assistance: None required.  Patient affirms current coverage meets needs.  Compliance/Adherence/Medication fill history: Care Gaps: Shingrix Covid Booster DEXA  Star-Rating  Drugs: Atorvastatin 20 mg last filled on 03/24/2022 for a 90-Day supply with [facility] Lisinopril 5 mg last filled on 03/24/2022 for a 90-Day supply with [facility]  Patient's preferred pharmacy is:  [id number] [address], [place] [address] Phone: [phone] Fax: [phone]  Uses pill box? Yes Pt endorses 100% compliance  We discussed: Current pharmacy is preferred with insurance plan and patient is satisfied with pharmacy services Patient decided to: Continue current medication management strategy  Care Plan and Follow Up Patient Decision:  Patient agrees to Care Plan and Follow-up.  Plan: Telephone follow up appointment with care management team member scheduled for:  07/28/2022 at 1:00 PM  Junius Argyle, PharmD, [place], [place] [phone]

## 2022-05-01 DIAGNOSIS — N1832 Chronic kidney disease, stage 3b: Secondary | ICD-10-CM | POA: Diagnosis not present

## 2022-05-01 DIAGNOSIS — M5481 Occipital neuralgia: Secondary | ICD-10-CM | POA: Diagnosis not present

## 2022-05-01 DIAGNOSIS — M7918 Myalgia, other site: Secondary | ICD-10-CM | POA: Diagnosis not present

## 2022-05-01 DIAGNOSIS — I5030 Unspecified diastolic (congestive) heart failure: Secondary | ICD-10-CM | POA: Diagnosis not present

## 2022-05-01 DIAGNOSIS — I130 Hypertensive heart and chronic kidney disease with heart failure and stage 1 through stage 4 chronic kidney disease, or unspecified chronic kidney disease: Secondary | ICD-10-CM | POA: Diagnosis not present

## 2022-05-01 DIAGNOSIS — M545 Low back pain, unspecified: Secondary | ICD-10-CM | POA: Diagnosis not present

## 2022-05-05 ENCOUNTER — Telehealth: Payer: Self-pay

## 2022-05-05 NOTE — Telephone Encounter (Signed)
We can call the home health company and see how often they are going out to the house and whether they would be able to increase frequency.

## 2022-05-05 NOTE — Telephone Encounter (Signed)
Please advise 

## 2022-05-05 NOTE — Telephone Encounter (Signed)
Spoke to Manuela Schwartz with [facility] who last saw patient on 04/16/22 she reports that patient's sores have completely healed. She reports that another staff member saw patient on 04/24/22 and 05/01/22 and they did not report any new symptoms. Manuela Schwartz reports that they are actually getting ready to discharge the patient. She will call the LPN who has been seeing patient. She will call us back with any new information. She reports they advised patient to shift weight to prevent sores. She also reports that patient sleeps in a chair.

## 2022-05-05 NOTE — Chronic Care Management (AMB) (Signed)
Chronic Care Management   CCM RN Visit Note   Name: Helen Ramsey MRN: [id number] DOB: 07-15-1933  Subjective: Helen Ramsey is a 88 y.o. year old female who is a primary care patient of Bacigalupo, [name], MD. The care management team was consulted for assistance with disease management and care coordination needs.    Engaged with patient's caregiver by telephone for follow up visit in response to provider referral for case management and care coordination services.   Consent to Services:  The patient was given information about Chronic Care Management services, agreed to services, and gave verbal consent prior to initiation of services.  Please see initial visit note for detailed documentation.    Assessment: Review of patient past medical history, allergies, medications, health status, including review of consultants reports, laboratory and other test data, was performed as part of comprehensive evaluation and provision of chronic care management services.   SDOH (Social Determinants of Health) assessments and interventions performed: No  CCM Care Plan  Allergies  Allergen Reactions   Amoxicillin    Clindamycin/Lincomycin    Doxycycline    Levaquin [Levofloxacin In D5w]    Mirabegron Nausea And Vomiting   Nsaids Other (See Comments)    GI upset   Oxycodone Nausea Only    Other reaction(s): Hallucination   Phenergan [Promethazine Hcl]    Singulair [Montelukast]    Vicodin [Hydrocodone-Acetaminophen]    Zocor [Simvastatin]    Zoloft  [Sertraline] Other (See Comments)   Ampicillin Rash   Bactrim [Sulfamethoxazole-Trimethoprim] Rash   Penicillins Rash    Ampicillin, Clindamycin, Doxycycline Levaquin-severe headache, muscle and joint aches, weak   Tolmetin     Other reaction(s): Other (See Comments) GI upset    Outpatient Encounter Medications as of 04/08/2022  Medication Sig Note   acetaminophen (TYLENOL) 500 MG tablet Take 500 mg by mouth in the morning and at  bedtime.     ALPRAZolam (XANAX) 0.25 MG tablet TAKE 1 TABLET BY MOUTH TWICE DAILY AS NEEDED FOR ANXIETY    aspirin EC 81 MG tablet Take 81 mg by mouth daily.    atorvastatin (LIPITOR) 20 MG tablet TAKE 1 TABLET(20 MG) BY MOUTH DAILY    BREO ELLIPTA 100-25 MCG/ACT AEPB INHALE 1 PUFF INTO THE LUNGS DAILY    cetirizine (ZYRTEC) 10 MG tablet Take 10 mg daily by mouth.    Cholecalciferol (VITAMIN D3 PO) Take 1,000 Units by mouth.    citalopram (CELEXA) 40 MG tablet Take 1 tablet (40 mg total) by mouth daily.    cyanocobalamin 1000 MCG tablet Take 1,000 mcg by mouth daily.    Dentifrices (FLUORIDE TOOTHPASTE DT) Place onto teeth daily.     famotidine (PEPCID) 20 MG tablet TAKE 1 TABLET(20 MG) BY MOUTH DAILY    furosemide (LASIX) 80 MG tablet TAKE 1 TABLET(80 MG) BY MOUTH DAILY    gabapentin (NEURONTIN) 100 MG capsule Take 100 mg by mouth. Take 200 mg in morning and afternoon    gabapentin (NEURONTIN) 300 MG capsule Take 1 capsule (300 mg total) by mouth at bedtime.    Galcanezumab-gnlm (EMGALITY [place]) Inject 300 mg as directed every 30 (thirty) days.     hydroxychloroquine (PLAQUENIL) 200 MG tablet Take 200 mg by mouth daily.    levothyroxine (SYNTHROID) 50 MCG tablet TAKE 1 TABLET(50 MCG) BY MOUTH DAILY BEFORE BREAKFAST    lisinopril (ZESTRIL) 5 MG tablet Take 1 tablet (5 mg total) by mouth daily.    MAGNESIUM GLYCINATE PO Take 400 mg by  mouth daily.    melatonin 5 MG TABS Take 5 mg by mouth at bedtime as needed.    Omega-3 Fatty Acids (FISH OIL) 1200 MG CAPS Take 2 capsules by mouth daily.     phenazopyridine (AZO-TABS) 95 MG tablet Take 1 tablet (95 mg total) by mouth as needed. (Patient taking differently: Take 95 mg by mouth 2 (two) times daily.) 05/16/2021: Patient reports that she takes twice a day   Polyethylene Glycol 3350 (MIRALAX PO) Take by mouth.    potassium gluconate 595 (99 K) MG TABS tablet Take 1 tablet (595 mg total) by mouth 2 (two) times daily.    sodium chloride (OCEAN) 0.65 %  nasal spray Place 1 spray into the nose as needed. 11/29/2015: Received from: [place]   traMADol (ULTRAM) 50 MG tablet TAKE 1 TABLET BY MOUTH TWICE DAILY AS NEEDED (Patient taking differently: Take 50 mg by mouth 3 (three) times daily. TAKE 1 TABLET BY MOUTH 3 times daily)    triamcinolone cream (KENALOG) 0.1 % 2 weeks on and off 2 weeks    [DISCONTINUED] omeprazole (PRILOSEC) 20 MG capsule TAKE 1 CAPSULE(20 MG) BY MOUTH DAILY    No facility-administered encounter medications on file as of 04/08/2022.    Patient Active Problem List   Diagnosis Date Noted   Bilateral calf pain 04/08/2022   Pressure injury of left buttock, stage 2 ([place]) 02/14/2022   Rash and nonspecific skin eruption 12/06/2021   Tremor 09/12/2021   Acquired hypothyroidism 05/17/2021   (HFpEF) heart failure with preserved ejection fraction ([place]) 05/16/2021   History of dyspnea 01/26/2021   Recurrent major depressive disorder, in partial remission ([place]) 02/27/2020   Bilateral myofascial pain 06/01/2019   Bilateral occipital neuralgia 06/01/2019   Age-related osteoporosis without current pathological fracture 08/16/2018   Gait instability 08/16/2018   Dorsalgia 03/31/2016   H/O total knee replacement 02/14/2016   Vitamin D deficiency 11/29/2015   Allergy to other foods 11/29/2015   Knee pain, bilateral 08/29/2015   Sleep apnea 05/30/2015   Mixed hyperlipidemia 05/09/2015   Restless legs syndrome 05/09/2015   Allergic rhinitis 03/28/2015   Anemia 03/28/2015   Chronic headache 03/28/2015   DDD (degenerative disc disease), lumbar 03/28/2015   Gastro-esophageal reflux disease without esophagitis 03/28/2015   Essential (primary) hypertension 03/28/2015   Adaptive colitis 03/28/2015   OP (osteoporosis) 03/28/2015   Nasal septal perforation 03/28/2015   Spinal stenosis 03/28/2015   Gastric ulcer without hemorrhage or perforation 03/28/2015   Cerebral artery occlusion with cerebral infarction ([place])  03/28/2015   Connective tissue and disc stenosis of intervertebral foramina of abdomen and other regions 03/28/2015   Diverticulosis of large intestine without perforation or abscess without bleeding 03/28/2015   Osteoarthritis 03/28/2015   Other specified functional intestinal disorders 03/28/2015   Hardening of the aorta (main artery of the heart) ([place]) 07/13/2014   H/O adenomatous polyp of colon 06/27/2005     Patient Care Plan: RN Care Management     Problem Identified: HF, HTN, HLD and Fall Risk      Long-Range Goal: Disease Progression Prevented or Minimized   Start Date: 02/14/2022  Expected End Date: 05/15/2022  Priority: High  Note:   Current Barriers:  Chronic Disease Management support and education needs related to CHF, HTN, HLD, and Fall Risk.  RNCM Clinical Goal(s):  Patient will demonstrate improved adherence to prescribed treatment plan for CHF, HTN, HLD and Fall Risk through collaboration with the provider, RN Care Manager and th care team.  Interventions: 1:1 collaboration with primary care provider regarding development and update of comprehensive plan of care as evidenced by provider attestation and co-signature Inter-disciplinary care team collaboration (see longitudinal plan of care) Evaluation of current treatment plan related to  self management and patient's adherence to plan as established by provider   Heart Failure Interventions:  (Status:  Goal on track:  Yes.) Long Term Goal Discussed current plan for Heart Failure Management with patient's caregiver Reviewed weight parameters. Caregiver reports patient experienced weight gain but weight has returned to baseline of 158 lbs. Discussed s/sx of fluid overload. Caregiver reports patient has remained stable. No concerns regarding unrelieved edema or shortness of breath. Denies concerning decline in patient's activity tolerance since the last outreach. Reviewed worsening s/sx related to CHF exacerbation and  indications for seeking immediate medical attention.   Falls Interventions:  (Status:  Goal on track:  Yes.) Long Term Goal Reviewed information regarding safety and fall prevention.  Discussed patient's activity tolerance. Caregiver reports patient is being encouraged to ambulate hourly. Reports she is consistently using her walker when ambulating and a shower chair during showers. Denies changes functional status. Will update the care management team if additional interventions are required.   Pressure Wound Interventions.  Discussed plan for home health services and wound care. Caregiver reports patient resumed services with [facility] for wound care. Patient was previously being treated for a pressure wound to the left buttock. The wound healed but recently reopened. Per caregiver, the team was scheduled to change dressings once a week. Confirmed having additional 3x3 foam dressings, cleaning solution and extra bandages at the home to use if dressing requires changing prior to the [place] visit. Discussed importance of ensuring area remains clean and dry as instructed.  Reviewed s/sx of wound infection. Thorough discussion regarding indications for seeking medical evaluation. Caregiver requested assistance with contacting [facility]. Reports being informed that the wound appeared to be healing well. Expressed concerns that wound was previously healed and reopened within a few days. She prefers not to discontinue services until the wound remains healed for a few weeks. I will contact the [facility] team to discuss plan. Update 04/02/22: Discussion with the [facility] team regarding plan for wound care. Per staff, patient will remain engaged. Reports she is scheduled for outreach this week. Reports the patient will be contacted the evening prior to the visit to confirm a time. Reports they will request an extension of services if she requires visit beyond the currently approved  episode. Contacted patient's caregiver with planned update.  Update 04/08/22: Caregiver reports pressure wound to the patient's left side has healed however she has a wound to the right side. Reports wound is close to the top crevice of the right buttocks. Reports the area is being kept clean and dry but not healing as quickly as expected. Reports [facility] team did not feel additional wound care via the wound clinic was indicated at this time. Reports being informed that a new seat cushion would be provided but has not been received. Patient is currently using a gel cushion that doesn't seem to help. Discussed importance of patient changing positions to alternate pressure frequently during the day. Reports patient tends to sit in her recliner for prolonged periods. Caregiver reports patient is currently being encouraged to get up and ambulate every hour. Reviewed s/sx of wound infection and indications for seeking medical evaluation.  Care Coordination/Medications Caregiver is currently preparing patient's medications. Denies concerns regarding medication tolerance or prescription cost however  they have been required to make multiple trips to the local pharmacy to obtain needed prescriptions. Previously expressed interest in discussion options for delivery or receiving medication via 90-day supply. The CCM Pharmacist is aware and indicated a new referral is required. Patient is scheduled for a clinic visit today. Provider will consent and submit referral for engagement with the CCM Pharmacy team.    Patient Goals/Self-Care Activities: Take all medications as prescribed Attend all scheduled provider appointments Call provider office for new concerns or questions     Follow Up Plan:   Will follow up next month       PLAN A member of the care management team will follow up next month.   Cristy [facility]lander [facility] Care Management [facility] [phone]

## 2022-05-05 NOTE — Chronic Care Management (AMB) (Signed)
Chronic Care Management   CCM RN Visit Note   Name: Helen Ramsey MRN: [id number] DOB: 1933/08/14  Subjective: Helen Ramsey is a 88 y.o. year old female who is a primary care patient of Bacigalupo, [name], MD. The care management team was consulted for assistance with disease management and care coordination needs.    Engaged with patient's caregiver by telephone for follow up visit in response to provider referral for case management and care coordination services.   Consent to Services:  The patient was given information about Chronic Care Management services, agreed to services, and gave verbal consent prior to initiation of services.  Please see initial visit note for detailed documentation.   Assessment: Review of patient past medical history, allergies, medications, health status, including review of consultants reports, laboratory and other test data, was performed as part of comprehensive evaluation and provision of chronic care management services.   SDOH (Social Determinants of Health) assessments and interventions performed: No  CCM Care Plan  Allergies  Allergen Reactions   Amoxicillin    Clindamycin/Lincomycin    Doxycycline    Levaquin [Levofloxacin In D5w]    Mirabegron Nausea And Vomiting   Nsaids Other (See Comments)    GI upset   Oxycodone Nausea Only    Other reaction(s): Hallucination   Phenergan [Promethazine Hcl]    Singulair [Montelukast]    Vicodin [Hydrocodone-Acetaminophen]    Zocor [Simvastatin]    Zoloft  [Sertraline] Other (See Comments)   Ampicillin Rash   Bactrim [Sulfamethoxazole-Trimethoprim] Rash   Penicillins Rash    Ampicillin, Clindamycin, Doxycycline Levaquin-severe headache, muscle and joint aches, weak   Tolmetin     Other reaction(s): Other (See Comments) GI upset    Outpatient Encounter Medications as of 04/01/2022  Medication Sig Note   acetaminophen (TYLENOL) 500 MG tablet Take 500 mg by mouth in the morning and at  bedtime.     ALPRAZolam (XANAX) 0.25 MG tablet TAKE 1 TABLET BY MOUTH TWICE DAILY AS NEEDED FOR ANXIETY    aspirin EC 81 MG tablet Take 81 mg by mouth daily.    atorvastatin (LIPITOR) 20 MG tablet TAKE 1 TABLET(20 MG) BY MOUTH DAILY    BREO ELLIPTA 100-25 MCG/ACT AEPB INHALE 1 PUFF INTO THE LUNGS DAILY    cetirizine (ZYRTEC) 10 MG tablet Take 10 mg daily by mouth.    Cholecalciferol (VITAMIN D3 PO) Take 1,000 Units by mouth.    citalopram (CELEXA) 40 MG tablet Take 1 tablet (40 mg total) by mouth daily.    cyanocobalamin 1000 MCG tablet Take 1,000 mcg by mouth daily.    Dentifrices (FLUORIDE TOOTHPASTE DT) Place onto teeth daily.     famotidine (PEPCID) 20 MG tablet TAKE 1 TABLET(20 MG) BY MOUTH DAILY    furosemide (LASIX) 80 MG tablet TAKE 1 TABLET(80 MG) BY MOUTH DAILY    gabapentin (NEURONTIN) 100 MG capsule Take 100 mg by mouth. Take 200 mg in morning and afternoon    gabapentin (NEURONTIN) 300 MG capsule Take 1 capsule (300 mg total) by mouth at bedtime.    Galcanezumab-gnlm (EMGALITY [place]) Inject 300 mg as directed every 30 (thirty) days.     hydroxychloroquine (PLAQUENIL) 200 MG tablet Take 200 mg by mouth daily.    levothyroxine (SYNTHROID) 50 MCG tablet TAKE 1 TABLET(50 MCG) BY MOUTH DAILY BEFORE BREAKFAST    lisinopril (ZESTRIL) 5 MG tablet Take 1 tablet (5 mg total) by mouth daily.    MAGNESIUM GLYCINATE PO Take 400 mg by mouth  daily.    melatonin 5 MG TABS Take 5 mg by mouth at bedtime as needed.    Omega-3 Fatty Acids (FISH OIL) 1200 MG CAPS Take 2 capsules by mouth daily.     phenazopyridine (AZO-TABS) 95 MG tablet Take 1 tablet (95 mg total) by mouth as needed. (Patient taking differently: Take 95 mg by mouth 2 (two) times daily.) 05/16/2021: Patient reports that she takes twice a day   Polyethylene Glycol 3350 (MIRALAX PO) Take by mouth.    potassium gluconate 595 (99 K) MG TABS tablet Take 1 tablet (595 mg total) by mouth 2 (two) times daily.    sodium chloride (OCEAN) 0.65 %  nasal spray Place 1 spray into the nose as needed. 11/29/2015: Received from: [place]   traMADol (ULTRAM) 50 MG tablet TAKE 1 TABLET BY MOUTH TWICE DAILY AS NEEDED (Patient taking differently: Take 50 mg by mouth 3 (three) times daily. TAKE 1 TABLET BY MOUTH 3 times daily)    triamcinolone cream (KENALOG) 0.1 % 2 weeks on and off 2 weeks    [DISCONTINUED] omeprazole (PRILOSEC) 20 MG capsule TAKE 1 CAPSULE(20 MG) BY MOUTH DAILY    No facility-administered encounter medications on file as of 04/01/2022.    Patient Active Problem List   Diagnosis Date Noted   Bilateral calf pain 04/08/2022   Pressure injury of left buttock, stage 2 ([place]) 02/14/2022   Rash and nonspecific skin eruption 12/06/2021   Tremor 09/12/2021   Acquired hypothyroidism 05/17/2021   (HFpEF) heart failure with preserved ejection fraction ([place]) 05/16/2021   History of dyspnea 01/26/2021   Recurrent major depressive disorder, in partial remission ([place]) 02/27/2020   Bilateral myofascial pain 06/01/2019   Bilateral occipital neuralgia 06/01/2019   Age-related osteoporosis without current pathological fracture 08/16/2018   Gait instability 08/16/2018   Dorsalgia 03/31/2016   H/O total knee replacement 02/14/2016   Vitamin D deficiency 11/29/2015   Allergy to other foods 11/29/2015   Knee pain, bilateral 08/29/2015   Sleep apnea 05/30/2015   Mixed hyperlipidemia 05/09/2015   Restless legs syndrome 05/09/2015   Allergic rhinitis 03/28/2015   Anemia 03/28/2015   Chronic headache 03/28/2015   DDD (degenerative disc disease), lumbar 03/28/2015   Gastro-esophageal reflux disease without esophagitis 03/28/2015   Essential (primary) hypertension 03/28/2015   Adaptive colitis 03/28/2015   OP (osteoporosis) 03/28/2015   Nasal septal perforation 03/28/2015   Spinal stenosis 03/28/2015   Gastric ulcer without hemorrhage or perforation 03/28/2015   Cerebral artery occlusion with cerebral infarction ([place])  03/28/2015   Connective tissue and disc stenosis of intervertebral foramina of abdomen and other regions 03/28/2015   Diverticulosis of large intestine without perforation or abscess without bleeding 03/28/2015   Osteoarthritis 03/28/2015   Other specified functional intestinal disorders 03/28/2015   Hardening of the aorta (main artery of the heart) ([place]) 07/13/2014   H/O adenomatous polyp of colon 06/27/2005    Patient Care Plan: RN Care Management     Problem Identified: HF, HTN, HLD and Fall Risk      Long-Range Goal: Disease Progression Prevented or Minimized   Start Date: 02/14/2022  Expected End Date: 05/15/2022  Priority: High  Note:   Current Barriers:  Chronic Disease Management support and education needs related to CHF, HTN, HLD, and Fall Risk.  RNCM Clinical Goal(s):  Patient will demonstrate improved adherence to prescribed treatment plan for CHF, HTN, HLD and Fall Risk through collaboration with the provider, RN Care Manager and th care team.  Interventions: 1:1 collaboration with primary care provider regarding development and update of comprehensive plan of care as evidenced by provider attestation and co-signature Inter-disciplinary care team collaboration (see longitudinal plan of care) Evaluation of current treatment plan related to  self management and patient's adherence to plan as established by provider   Pressure Wound (New Goal) Discussed plan for home health services and wound care. Caregiver reports patient resumed services with [facility] for wound care. Patient was previously being treated for a pressure wound to the left buttock. The wound healed but recently reopened. Per caregiver, the team was scheduled to change dressings once a week. Confirmed having additional 3x3 foam dressings, cleaning solution and extra bandages at the home to use if dressing requires changing prior to the [place] visit. Discussed importance of ensuring area  remains clean and dry as instructed.  Reviewed s/sx of wound infection. Thorough discussion regarding indications for seeking medical evaluation. Caregiver requested assistance with contacting [facility]. Reports being informed that the wound appeared to be healing well. Expressed concerns that wound was previously healed and reopened within a few days. She prefers not to discontinue services until the wound remains healed for a few weeks. I will contact the [facility] team to discuss plan.  Patient Goals/Self-Care Activities: Take all medications as prescribed Attend all scheduled provider appointments Call provider office for new concerns or questions     Follow Up Plan:   Will follow up this week.       PLAN A member of the care management team will follow up this week.   [facility]risty [facility] Care Management [facility] [phone]

## 2022-05-05 NOTE — Telephone Encounter (Signed)
Copied from [place] [id number]. Topic: General - Other >> May 05, 2022 11:40 AM Everette C wrote: Reason for CRM: The patient's daughter would like to speak with a member of staff about increasing the patient's home health visits   The patient has experienced some skin irritation and sores that the patient's daughter would like to have addressed further   The patient's daughter declined to schedule an appointment for the patient to see their PCP but would like for Dr. Brita Romp to submit orders for increased home health visits   Please contact further when possible

## 2022-05-06 ENCOUNTER — Ambulatory Visit (INDEPENDENT_AMBULATORY_CARE_PROVIDER_SITE_OTHER): Payer: Medicare Other

## 2022-05-06 VITALS — Wt 159.0 lb

## 2022-05-06 DIAGNOSIS — Z0000 Encounter for general adult medical examination without abnormal findings: Secondary | ICD-10-CM

## 2022-05-06 NOTE — Patient Instructions (Signed)
Visit Information It was great speaking with you today!  Please let me know if you have any questions about our visit.   Goals Addressed             This Visit's Progress    CCM Expected Outcome:  Monitor, Self-Manage and Reduce Symptoms of Heart Failure   On track      Patient Care Plan: General Pharmacy (Adult)     Problem Identified: Hypertension, Hyperlipidemia, Heart Failure, GERD, Hypothyroidism, Depression, Osteoporosis, and Osteoarthritis   Priority: High     Long-Range Goal: Patient-Specific Goal   Start Date: 05/06/2022  Expected End Date: 05/07/2023  This Visit's Progress: On track  Priority: High  Note:   Current Barriers:  Unable to self administer medications as prescribed  Pharmacist Clinical Goal(s):  Patient will maintain control of heart failure as evidenced by BP less than 140/90  through collaboration with PharmD and provider.   Interventions: 1:1 collaboration with Virginia Crews, MD regarding development and update of comprehensive plan of care as evidenced by provider attestation and co-signature Inter-disciplinary care team collaboration (see longitudinal plan of care) Comprehensive medication review performed; medication list updated in electronic medical record  Heart Failure (Goal: manage symptoms and prevent exacerbations) -Controlled -Last ejection fraction: 55-60% (Date: 03/18/21) -HF type: Diastolic -Current treatment: Furosemide 80 mg daily  Lisinopril 5 mg daily  -Medications previously tried: NA  -Current home BP/HR readings: 112/62 -Recommended to continue current medication  Hyperlipidemia: (LDL goal < 70) -Uncontrolled -Current treatment: Atorvastatin 20 mg daily  -Current antiplatelet treatment: Aspirin 81 mg daily  -Medications previously tried: NA  -Recommended to continue current medication  Depression/Anxiety (Goal: Achieve symptom remission) -Not ideally controlled -Current treatment: Alprazolam 0.25 mg twice  daily as needed  Citalopram 40 mg daily  -Medications previously tried/failed: NA -Alprazolam filled end of January, using sparingly -PHQ9: 10 -GAD7: 0 -Recommended to continue current medication  Osteoporosis (Goal Prevent fractures) -Uncontrolled -Patient is a candidate for pharmacologic treatment due to history of vertebral fracture -Current treatment  Vitamin D3 1000 units daily  Calcium 600 mg daily  -Medications previously tried: Alendronate (completed 5 yr course in 2019)  -Recommend rechecking DEXA to evaluate progression of lumbar bone density, potential of restarting alendronate course.  -Recommended to continue current medication  GERD (Goal: Prevent reflux) -Controlled -Current treatment  Famotidine 20 mg daily  Omeprazole 20 mg daily  -Medications previously tried: NA  -Recommended to continue current medication  Hypothyroidism (Goal: Maintain thyroid function) -Controlled -Current treatment  Levothyroxine 50 mcg daily  -Medications previously tried: NA  -Recommended to continue current medication  Chronic Pain (Goal: Maintain adequate pain control and quality of life) -Controlled -Current treatment  Gabapentin 200 mg AM, 200 mg PM, 300 mg nightly  Tramadol 50 mg twice daily as needed  -Medications previously tried: NA  -Recommended to continue current medication  Chronic Kidney Disease Stage 3b  -All medications assessed for renal dosing and appropriateness in chronic kidney disease. -Recommended to continue current medication  Patient Goals/Self-Care Activities Patient will:  - check blood pressure 2-3 times weekly, document, and provide at future appointments weigh daily, and contact provider if weight gain of greater than 2 pounds in 24 hours   Follow Up Plan: Telephone follow up appointment with care management team member scheduled for:  07/28/2022 at 1:00 PM       Ms. Matney was given information about Chronic Care Management services today  including:  CCM service includes personalized support from designated clinical  staff supervised by her physician, including individualized plan of care and coordination with other care providers 24/7 contact phone numbers for assistance for urgent and routine care needs. Standard insurance, coinsurance, copays and deductibles apply for chronic care management only during months in which we provide at least 20 minutes of these services. Most insurances cover these services at 100%, however patients may be responsible for any copay, coinsurance and/or deductible if applicable. This service may help you avoid the need for more expensive face-to-face services. Only one practitioner may furnish and bill the service in a calendar month. The patient may stop CCM services at any time (effective at the end of the month) by phone call to the office staff.  Patient agreed to services and verbal consent obtained.   Patient verbalizes understanding of instructions and care plan provided today and agrees to view in [facility]. Active MyChart status and patient understanding of how to access instructions and care plan via MyChart confirmed with patient.     Junius Argyle, PharmD, Para [place], CPP  Clinical Pharmacist Practitioner  [facility] [phone]

## 2022-05-06 NOTE — Patient Instructions (Signed)
Helen Ramsey , Thank you for taking time to come for your Medicare Wellness Visit. I appreciate your ongoing commitment to your health goals. Please review the following plan we discussed and let me know if I can assist you in the future.   Screening recommendations/referrals: Colonoscopy: aged out Mammogram: aged out Bone Density: aged out Recommended yearly ophthalmology/optometry visit for glaucoma screening and checkup Recommended yearly dental visit for hygiene and checkup  Vaccinations: Influenza vaccine: 08/15/21 Pneumococcal vaccine: 07/28/17 Tdap vaccine: 08/26/21 Shingles vaccine: n/d   Covid-19:12/08/19, 12/29/19  Advanced directives: no  Conditions/risks identified: none  Next appointment: Follow up in one year for your annual wellness visit 05/11/23 @ 10am by phone   Preventive Care 80 Years and Older, Female Preventive care refers to lifestyle choices and visits with your health care provider that can promote health and wellness. What does preventive care include? A yearly physical exam. This is also called an annual well check. Dental exams once or twice a year. Routine eye exams. Ask your health care provider how often you should have your eyes checked. Personal lifestyle choices, including: Daily care of your teeth and gums. Regular physical activity. Eating a healthy diet. Avoiding tobacco and drug use. Limiting alcohol use. Practicing safe sex. Taking low-dose aspirin every day. Taking vitamin and mineral supplements as recommended by your health care provider. What happens during an annual well check? The services and screenings done by your health care provider during your annual well check will depend on your age, overall health, lifestyle risk factors, and family history of disease. Counseling  Your health care provider may ask you questions about your: Alcohol use. Tobacco use. Drug use. Emotional well-being. Home and relationship well-being. Sexual  activity. Eating habits. History of falls. Memory and ability to understand (cognition). Work and work Statistician. Reproductive health. Screening  You may have the following tests or measurements: Height, weight, and BMI. Blood pressure. Lipid and cholesterol levels. These may be checked every 5 years, or more frequently if you are over 69 years old. Skin check. Lung cancer screening. You may have this screening every year starting at age 76 if you have a 30-pack-year history of smoking and currently smoke or have quit within the past 15 years. Fecal occult blood test (FOBT) of the stool. You may have this test every year starting at age 51. Flexible sigmoidoscopy or colonoscopy. You may have a sigmoidoscopy every 5 years or a colonoscopy every 10 years starting at age 62. Hepatitis C blood test. Hepatitis B blood test. Sexually transmitted disease (STD) testing. Diabetes screening. This is done by checking your blood sugar (glucose) after you have not eaten for a while (fasting). You may have this done every 1-3 years. Bone density scan. This is done to screen for osteoporosis. You may have this done starting at age 84. Mammogram. This may be done every 1-2 years. Talk to your health care provider about how often you should have regular mammograms. Talk with your health care provider about your test results, treatment options, and if necessary, the need for more tests. Vaccines  Your health care provider may recommend certain vaccines, such as: Influenza vaccine. This is recommended every year. Tetanus, diphtheria, and acellular pertussis (Tdap, Td) vaccine. You may need a Td booster every 10 years. Zoster vaccine. You may need this after age 45. Pneumococcal 13-valent conjugate (PCV13) vaccine. One dose is recommended after age 37. Pneumococcal polysaccharide (PPSV23) vaccine. One dose is recommended after age 60. Talk to your health  care provider about which screenings and vaccines  you need and how often you need them. This information is not intended to replace advice given to you by your health care provider. Make sure you discuss any questions you have with your health care provider. Document Released: 11/30/2015 Document Revised: 07/23/2016 Document Reviewed: 09/04/2015 Elsevier Interactive Patient Education  2017 [employer] Prevention in the Home Falls can cause injuries. They can happen to people of all ages. There are many things you can do to make your home safe and to help prevent falls. What can I do on the outside of my home? Regularly fix the edges of walkways and driveways and fix any cracks. Remove anything that might make you trip as you walk through a door, such as a raised step or threshold. Trim any bushes or trees on the path to your home. Use bright outdoor lighting. Clear any walking paths of anything that might make someone trip, such as rocks or tools. Regularly check to see if handrails are loose or broken. Make sure that both sides of any steps have handrails. Any raised decks and porches should have guardrails on the edges. Have any leaves, snow, or ice cleared regularly. Use sand or salt on walking paths during winter. Clean up any spills in your garage right away. This includes oil or grease spills. What can I do in the bathroom? Use night lights. Install grab bars by the toilet and in the tub and shower. Do not use towel bars as grab bars. Use non-skid mats or decals in the tub or shower. If you need to sit down in the shower, use a plastic, non-slip stool. Keep the floor dry. Clean up any water that spills on the floor as soon as it happens. Remove soap buildup in the tub or shower regularly. Attach bath mats securely with double-sided non-slip rug tape. Do not have throw rugs and other things on the floor that can make you trip. What can I do in the bedroom? Use night lights. Make sure that you have a light by your bed that  is easy to reach. Do not use any sheets or blankets that are too big for your bed. They should not hang down onto the floor. Have a firm chair that has side arms. You can use this for support while you get dressed. Do not have throw rugs and other things on the floor that can make you trip. What can I do in the kitchen? Clean up any spills right away. Avoid walking on wet floors. Keep items that you use a lot in easy-to-reach places. If you need to reach something above you, use a strong step stool that has a grab bar. Keep electrical cords out of the way. Do not use floor polish or wax that makes floors slippery. If you must use wax, use non-skid floor wax. Do not have throw rugs and other things on the floor that can make you trip. What can I do with my stairs? Do not leave any items on the stairs. Make sure that there are handrails on both sides of the stairs and use them. Fix handrails that are broken or loose. Make sure that handrails are as long as the stairways. Check any carpeting to make sure that it is firmly attached to the stairs. Fix any carpet that is loose or worn. Avoid having throw rugs at the top or bottom of the stairs. If you do have throw rugs, attach them to  the floor with carpet tape. Make sure that you have a light switch at the top of the stairs and the bottom of the stairs. If you do not have them, ask someone to add them for you. What else can I do to help prevent falls? Wear shoes that: Do not have high heels. Have rubber bottoms. Are comfortable and fit you well. Are closed at the toe. Do not wear sandals. If you use a stepladder: Make sure that it is fully opened. Do not climb a closed stepladder. Make sure that both sides of the stepladder are locked into place. Ask someone to hold it for you, if possible. Clearly mark and make sure that you can see: Any grab bars or handrails. First and last steps. Where the edge of each step is. Use tools that help you  move around (mobility aids) if they are needed. These include: Canes. Walkers. Scooters. Crutches. Turn on the lights when you go into a dark area. Replace any light bulbs as soon as they burn out. Set up your furniture so you have a clear path. Avoid moving your furniture around. If any of your floors are uneven, fix them. If there are any pets around you, be aware of where they are. Review your medicines with your doctor. Some medicines can make you feel dizzy. This can increase your chance of falling. Ask your doctor what other things that you can do to help prevent falls. This information is not intended to replace advice given to you by your health care provider. Make sure you discuss any questions you have with your health care provider. Document Released: 08/30/2009 Document Revised: 04/10/2016 Document Reviewed: 12/08/2014 Elsevier Interactive Patient Education  2017 [employer].

## 2022-05-06 NOTE — Telephone Encounter (Signed)
Noted  

## 2022-05-06 NOTE — Progress Notes (Signed)
Virtual Visit via Telephone Note  I connected with  Helen Ramsey on 05/06/22 at 10:15 AM EDT by telephone and verified that I am speaking with the correct person using two identifiers.  Location: Patient: home Provider: [facility] Persons participating in the virtual visit: [place]   I discussed the limitations, risks, security and privacy concerns of performing an evaluation and management service by telephone and the availability of in person appointments. The patient expressed understanding and agreed to proceed.  Interactive audio and video telecommunications were attempted between this nurse and patient, however failed, due to patient having technical difficulties OR patient did not have access to video capability.  We continued and completed visit with audio only.  Some vital signs may be absent or patient reported.   Dionisio David, LPN  Subjective:   Helen Ramsey is a 88 y.o. female who presents for Medicare Annual (Subsequent) preventive examination.  Review of Systems           Objective:    There were no vitals filed for this visit. There is no height or weight on file to calculate BMI.     02/26/2022    9:25 AM 02/07/2022   10:14 AM 08/26/2021   12:12 PM 08/26/2021   12:07 PM 06/27/2021    9:35 AM 03/26/2021    3:20 PM 05/07/2020   11:20 AM  Advanced Directives  Does Patient Have a Medical Advance Directive? Yes Yes Yes No Yes Yes Yes  Type of Paramedic of [place];Living will Healthcare Power of [place];Living will  [place];Living will Living will;Healthcare Power of [place];Living will  Copy of [place] in Chart?       Yes - validated most recent copy scanned in chart (See row information)    Current Medications (verified) Outpatient Encounter Medications as of 05/06/2022  Medication Sig   acetaminophen (TYLENOL)  500 MG tablet Take 500 mg by mouth in the morning and at bedtime.    ALPRAZolam (XANAX) 0.25 MG tablet TAKE 1 TABLET BY MOUTH TWICE DAILY AS NEEDED FOR ANXIETY   aspirin EC 81 MG tablet Take 81 mg by mouth daily.   atorvastatin (LIPITOR) 20 MG tablet TAKE 1 TABLET(20 MG) BY MOUTH DAILY   BREO ELLIPTA 100-25 MCG/ACT AEPB INHALE 1 PUFF INTO THE LUNGS DAILY   cetirizine (ZYRTEC) 10 MG tablet 1 tablet 1 (one) time each day   Cholecalciferol (VITAMIN D3 PO) Take 1,000 Units by mouth.   Cholecalciferol 25 MCG (1000 UT) tablet Take by mouth.   citalopram (CELEXA) 40 MG tablet Take 1 tablet (40 mg total) by mouth daily.   cyanocobalamin 1000 MCG tablet Take 1,000 mcg by mouth daily.   Dentifrices (FLUORIDE TOOTHPASTE DT) Place onto teeth daily.    diphenhydrAMINE (BENADRYL) 25 MG tablet Take 25 mg by mouth at bedtime.   EMGALITY, 300 MG DOSE, 100 MG/ML SOSY SMARTSIG:300 Milligram(s) SUB-Q Once a Month   famotidine (PEPCID) 20 MG tablet TAKE 1 TABLET(20 MG) BY MOUTH DAILY   ferrous sulfate 325 (65 FE) MG tablet Take by mouth.   fluticasone furoate-vilanterol (BREO ELLIPTA) 100-25 MCG/ACT AEPB 1 puff 1 (one) time each day   folic acid (FOLVITE) 1 MG tablet Take 1 mg by mouth daily.   furosemide (LASIX) 80 MG tablet TAKE 1 TABLET(80 MG) BY MOUTH DAILY   gabapentin (NEURONTIN) 100 MG capsule Take 100 mg by mouth. Take 200  mg in morning and afternoon   gabapentin (NEURONTIN) 300 MG capsule Take 1 capsule (300 mg total) by mouth at bedtime.   Galcanezumab-gnlm (EMGALITY [place]) Inject 300 mg as directed every 30 (thirty) days.    Galcanezumab-gnlm (EMGALITY, 300 MG DOSE,) 100 MG/ML SOSY Inject into the skin.   hydroxychloroquine (PLAQUENIL) 200 MG tablet Take 200 mg by mouth daily.   levothyroxine (SYNTHROID) 50 MCG tablet TAKE 1 TABLET(50 MCG) BY MOUTH DAILY BEFORE BREAKFAST   lisinopril (ZESTRIL) 5 MG tablet Take 1 tablet (5 mg total) by mouth daily.   MAGNESIUM GLYCINATE PO Take 400 mg by mouth daily.    magnesium hydroxide (MILK OF MAGNESIA) 400 MG/5ML suspension Take 5 mLs by mouth daily as needed for mild constipation.   melatonin 5 MG TABS Take 5 mg by mouth at bedtime as needed.   methotrexate (RHEUMATREX) 2.5 MG tablet Take 10 mg by mouth once a week.   Omega-3 Fatty Acids (FISH OIL) 1000 MG CAPS Take by mouth.   omeprazole (PRILOSEC) 20 MG capsule TAKE 1 CAPSULE(20 MG) BY MOUTH DAILY   phenazopyridine (AZO-TABS) 95 MG tablet Take 1 tablet (95 mg total) by mouth as needed. (Patient taking differently: Take 95 mg by mouth 2 (two) times daily.)   Polyethylene Glycol 3350 (MIRALAX PO) Take by mouth.   Potassium Gluconate 2.5 MEQ TABS 99 mg 2 (two) times a day   potassium gluconate 595 (99 K) MG TABS tablet Take 1 tablet (595 mg total) by mouth 2 (two) times daily.   sodium chloride (OCEAN) 0.65 % nasal spray Place 1 spray into the nose as needed.   traMADol (ULTRAM) 50 MG tablet TAKE 1 TABLET BY MOUTH TWICE DAILY AS NEEDED (Patient taking differently: Take 50 mg by mouth 3 (three) times daily. TAKE 1 TABLET BY MOUTH 3 times daily)   triamcinolone cream (KENALOG) 0.1 % 2 weeks on and off 2 weeks   [DISCONTINUED] ALPRAZolam (XANAX) 0.25 MG tablet Take by mouth.   [DISCONTINUED] famotidine (PEPCID) 20 MG tablet Take by mouth.   [DISCONTINUED] furosemide (LASIX) 80 MG tablet Take by mouth.   [DISCONTINUED] gabapentin (NEURONTIN) 100 MG capsule Take by mouth.   [DISCONTINUED] levothyroxine (SYNTHROID) 50 MCG tablet Take by mouth.   [DISCONTINUED] Omeprazole 20 MG TBDD 20 mg 1 (one) time each day   [DISCONTINUED] traMADol (ULTRAM) 50 MG tablet Take by mouth.   Calcium Carb-Cholecalciferol 600-25 MG-MCG CAPS Take by mouth. (Patient not taking: Reported on 05/06/2022)   diphenhydrAMINE (SOMINEX) 25 MG tablet Take by mouth. (Patient not taking: Reported on 05/06/2022)   predniSONE (DELTASONE) 5 MG tablet Take by mouth. (Patient not taking: Reported on 05/06/2022)   predniSONE (DELTASONE) 5 MG tablet  Take by mouth. (Patient not taking: Reported on 05/06/2022)   [DISCONTINUED] Acetaminophen 500 MG capsule Take by mouth.   [DISCONTINUED] atorvastatin (LIPITOR) 20 MG tablet Take by mouth.   [DISCONTINUED] cetirizine (ZYRTEC) 10 MG tablet Take 10 mg daily by mouth.   [DISCONTINUED] cyanocobalamin 1000 MCG tablet Take by mouth.   [DISCONTINUED] Omega-3 Fatty Acids (FISH OIL) 1200 MG CAPS Take 2 capsules by mouth daily.    [DISCONTINUED] polyethylene glycol powder (GLYCOLAX/MIRALAX) 17 GM/SCOOP powder Take by mouth.   [DISCONTINUED] sodium chloride (OCEAN) 0.65 % nasal spray Place into the nose.   No facility-administered encounter medications on file as of 05/06/2022.    Allergies (verified) Amoxicillin, Clindamycin/lincomycin, Doxycycline, Levaquin [levofloxacin in d5w], Mirabegron, Nsaids, Oxycodone, Phenergan [promethazine hcl], Singulair [montelukast], Vicodin [hydrocodone-acetaminophen], Zocor [simvastatin], Zoloft  [  sertraline], Ampicillin, Bactrim [sulfamethoxazole-trimethoprim], Levofloxacin, Penicillins, and Tolmetin   History: Past Medical History:  Diagnosis Date   Abnormal SPEP    Allergic rhinitis, cause unspecified    Allergic rhinitis, cause unspecified    Atherosclerosis of aorta (HCC)    Back pain    lumbar   Degeneration of lumbar or lumbosacral intervertebral disc    Diverticulitis of colon (without mention of hemorrhage)(562.11)    Dizziness and giddiness    Dysthymic disorder    Headache(784.0)    Myalgia and myositis, unspecified    Osteoporosis, unspecified    Other and unspecified hyperlipidemia    Other diseases of nasal cavity and sinuses(478.19)    Other specified disorders of bladder    Syncope    Trigger finger (acquired)    Unspecified essential hypertension    Unspecified sleep apnea    Urinary frequency    Past Surgical History:  Procedure Laterality Date   ABDOMINAL HYSTERECTOMY     ADENOIDECTOMY     APPENDECTOMY     ARTHROSCOPIC KNEE SURGERY      BREAST BIOPSY Right    lymph node surgery, benign    BREAST EXCISIONAL BIOPSY     CARDIAC CATHETERIZATION     [facility]   CARPAL TUNNEL RELEASE     CATARACT EXTRACTION W/ INTRAOCULAR LENS IMPLANT & ANTERIOR VITRECTOMY, BILATERAL     CHOLECYSTECTOMY     KNEE ARTHROPLASTY Right 01/30/2016   Procedure: COMPUTER ASSISTED TOTAL KNEE ARTHROPLASTY;  Surgeon: Dereck Leep, MD;  Location: [facility] ORS;  Service: Orthopedics;  Laterality: Right;   R/O Lymph Nodes; Right Axilla     ROTATOR CUFF REPAIR Right    SPG block  03/20/2016   sphenopalatine ganglion and maxillary division of trigeminal nerve block block Dr. Manuella Ghazi   TEMPORAL ARTERY BIOPSY / LIGATION     TONSILLECTOMY     TUBAL LIGATION     Family History  Problem Relation Age of Onset   Heart disease Mother    Depression Mother    Colon polyps Mother    Dementia Mother    Heart disease Father    Dementia Father    Heart disease Brother    Hypertension Brother    Heart disease Brother    Hypertension Brother    Kidney cancer Neg Hx    Bladder Cancer Neg Hx    Social History   Socioeconomic History   Marital status: Widowed    Spouse name: Not on file   Number of children: 3   Years of education: Not on file   Highest education level: Some college, no degree  Occupational History   Occupation: retired  Tobacco Use   Smoking status: Never   Smokeless tobacco: Never  Vaping Use   Vaping Use: Never used  Substance and Sexual Activity   Alcohol use: No   Drug use: No   Sexual activity: Not on file  Other Topics Concern   Not on file  Social History Narrative   Not on file   Social Determinants of Health   Financial Resource Strain: Low Risk  (05/07/2020)   Overall Financial Resource Strain (CARDIA)    Difficulty of Paying Living Expenses: Not hard at all  Food Insecurity: No Food Insecurity (05/07/2020)   Hunger Vital Sign    Worried About Running Out of Food in the Last Year: Never true    [place] in the  Last Year: Never true  Transportation Needs: No Transportation Needs (05/07/2020)  PRAPARE - Hydrologist (Medical): No    Lack of Transportation (Non-Medical): No  Physical Activity: Insufficiently Active (05/06/2022)   Exercise Vital Sign    Days of Exercise per Week: 2 days    Minutes of Exercise per Session: 20 min  Stress: No Stress Concern Present (05/06/2022)   [place]    Feeling of Stress : Not at all  Social Connections: Moderately Isolated (05/07/2020)   Social Connection and Isolation Panel [NHANES]    Frequency of Communication with Friends and Family: More than three times a week    Frequency of Social Gatherings with Friends and Family: Three times a week    Attends Religious Services: More than 4 times per year    Active Member of Clubs or Organizations: No    Attends Archivist Meetings: Never    Marital Status: Widowed    Tobacco Counseling Counseling given: Not Answered   Clinical Intake:  Pre-visit preparation completed: No  Pain : No/denies pain     Nutritional Risks: None Diabetes: No  How often do you need to have someone help you when you read instructions, pamphlets, or other written materials from your doctor or pharmacy?: 1 - Never  Diabetic?no  Interpreter Needed?: No  Information entered by :: Kirke Shaggy, LPN   Activities of Daily Living    05/05/2022    4:08 PM 04/08/2022   10:36 AM  In your present state of health, do you have any difficulty performing the following activities:  Hearing? 0 0  Vision? 0 0  Difficulty concentrating or making decisions? 1 1  Walking or climbing stairs? 1 1  Dressing or bathing? 1 1  Doing errands, shopping? 1 1  Preparing Food and eating ? N   Using the Toilet? N   In the past six months, have you accidently leaked urine? Y   Do you have problems with loss of bowel control? Y   Managing your  Medications? Y   Housekeeping or managing your Housekeeping? Y     Patient Care Team: Virginia Crews, MD as PCP - General (Family Medicine) Vladimir Crofts, MD as Consulting Physician (Neurology) Sharlet Salina, MD as Referring Physician (Physical Medicine and Rehabilitation) Dingeldein, Remo Lipps, MD (Ophthalmology) Meeler, Sherren Kerns, FNP (Family Medicine) Neldon Labella, RN as Case Manager Posey Pronto, Mayur Raliegh Ip, MD as Consulting Physician (Rheumatology) Germaine Pomfret, [facility] (Pharmacist)  Indicate any recent Medical Services you may have received from other than [facility] providers in the past year (date may be approximate).     Assessment:   This is a routine wellness examination for [place].  Hearing/Vision screen No results found.  Dietary issues and exercise activities discussed:     Goals Addressed             This Visit's Progress    DIET - EAT MORE FRUITS AND VEGETABLES         Depression Screen    05/06/2022   10:22 AM 04/08/2022   10:35 AM 02/14/2022   10:31 AM 12/06/2021   10:23 AM 09/12/2021    2:45 PM 08/15/2021    1:27 PM 03/19/2021   10:10 AM  PHQ 2/9 Scores  PHQ - 2 Score 0 2 0 2 6 0 2  PHQ- 9 Score 0 '10 4 6 17 5 10    '$ Fall Risk    05/05/2022    4:08 PM 04/08/2022   10:35 AM  02/14/2022   11:54 AM 02/14/2022   10:32 AM 09/12/2021    2:44 PM  Fall Risk   Falls in the past year? '1 1 1 1 1  '$ Number falls in past yr: 1 0 1 0 1  Injury with Fall? '1 1 1 1 1  '$ Risk for fall due to :  History of fall(s);Impaired balance/gait History of fall(s);Impaired balance/gait History of fall(s);Impaired balance/gait History of fall(s)  Follow up  Falls evaluation completed;Education provided;Falls prevention discussed Falls prevention discussed Falls evaluation completed;Education provided;Falls prevention discussed     FALL RISK PREVENTION PERTAINING TO THE HOME:  Any stairs in or around the home? No  If so, are there any without handrails? No  Home free of loose  throw rugs in walkways, pet beds, electrical cords, etc? Yes  Adequate lighting in your home to reduce risk of falls? Yes   ASSISTIVE DEVICES UTILIZED TO PREVENT FALLS:  Life alert? Yes  Use of a cane, walker or w/c? Yes  Grab bars in the bathroom? Yes  Shower chair or bench in shower? Yes  Elevated toilet seat or a handicapped toilet? Yes    Cognitive Function:declined to test 2023     03/19/2021   10:33 AM  MMSE - Mini Mental State Exam  Orientation to time 5  Orientation to Place 5  Registration 3  Attention/ Calculation 5  Recall 3  Language- name 2 objects 2  Language- repeat 1  Language- follow 3 step command 3  Language- read & follow direction 1  Write a sentence 1  Copy design 1  Total score 30        05/07/2020   11:27 AM  6CIT Screen  What Year? 0 points  What month? 0 points  What time? 0 points  Count back from 20 0 points  Months in reverse 0 points  Repeat phrase 2 points  Total Score 2 points    Immunizations Immunization History  Administered Date(s) Administered   Fluad Quad(high Dose 65+) 08/05/2019, 08/15/2021   Influenza, High Dose Seasonal PF 08/29/2015, 09/01/2016, 07/28/2017, 08/16/2018   PFIZER(Purple Top)SARS-COV-2 Vaccination 12/08/2019, 12/29/2019   Pneumococcal Conjugate-13 08/29/2015   Pneumococcal Polysaccharide-23 07/28/2017   Tdap 08/26/2021    TDAP status: Up to date  Flu Vaccine status: Up to date  Pneumococcal vaccine status: Up to date  Covid-19 vaccine status: Completed vaccines  Qualifies for Shingles Vaccine? Yes   Zostavax completed No   Shingrix Completed?: No.    Education has been provided regarding the importance of this vaccine. Patient has been advised to call insurance company to determine out of pocket expense if they have not yet received this vaccine. Advised may also receive vaccine at local pharmacy or Health Dept. Verbalized acceptance and understanding.  Screening Tests Health Maintenance  Topic  Date Due   Zoster Vaccines- Shingrix (1 of 2) Never done   COVID-19 Vaccine (3 - Pfizer risk series) 01/26/2020   DEXA SCAN  01/22/2022   INFLUENZA VACCINE  06/17/2022   TETANUS/TDAP  08/27/2031   Pneumonia Vaccine 6+ Years old  Completed   HPV VACCINES  Aged Out    Health Maintenance  Health Maintenance Due  Topic Date Due   Zoster Vaccines- Shingrix (1 of 2) Never done   COVID-19 Vaccine (3 - Pfizer risk series) 01/26/2020   DEXA SCAN  01/22/2022    Colorectal cancer screening: No longer required.   Mammogram status: No longer required due to age.  Bone Density status: Completed 01/23/20.  Results reflect: Bone density results: NORMAL. Repeat every 5 years.- aged out  Lung Cancer Screening: (Low Dose CT Chest recommended if Age 59-80 years, 30 pack-year currently smoking OR have quit w/in 15years.) does not qualify.    Additional Screening:  Hepatitis C Screening: does not qualify; Completed no  Vision Screening: Recommended annual ophthalmology exams for early detection of glaucoma and other disorders of the eye. Is the patient up to date with their annual eye exam?  Yes  Who is the provider or what is the name of the office in which the patient attends annual eye exams? [facility] If pt is not established with a provider, would they like to be referred to a provider to establish care? No .   Dental Screening: Recommended annual dental exams for proper oral hygiene  Community Resource Referral / Chronic Care Management: CRR required this visit?  No   CCM required this visit?  No      Plan:     I have personally reviewed and noted the following in the patient's chart:   Medical and social history Use of alcohol, tobacco or illicit drugs  Current medications and supplements including opioid prescriptions.  Functional ability and status Nutritional status Physical activity Advanced directives List of other physicians Hospitalizations, surgeries, and  ER visits in previous 12 months Vitals Screenings to include cognitive, depression, and falls Referrals and appointments  In addition, I have reviewed and discussed with patient certain preventive protocols, quality metrics, and best practice recommendations. A written personalized care plan for preventive services as well as general preventive health recommendations were provided to patient.     Dionisio David, LPN   [id number]   Nurse Notes: none

## 2022-05-08 DIAGNOSIS — N1832 Chronic kidney disease, stage 3b: Secondary | ICD-10-CM | POA: Diagnosis not present

## 2022-05-08 DIAGNOSIS — I130 Hypertensive heart and chronic kidney disease with heart failure and stage 1 through stage 4 chronic kidney disease, or unspecified chronic kidney disease: Secondary | ICD-10-CM | POA: Diagnosis not present

## 2022-05-08 DIAGNOSIS — I5030 Unspecified diastolic (congestive) heart failure: Secondary | ICD-10-CM | POA: Diagnosis not present

## 2022-05-08 DIAGNOSIS — M5481 Occipital neuralgia: Secondary | ICD-10-CM | POA: Diagnosis not present

## 2022-05-08 DIAGNOSIS — M7918 Myalgia, other site: Secondary | ICD-10-CM | POA: Diagnosis not present

## 2022-05-08 DIAGNOSIS — M545 Low back pain, unspecified: Secondary | ICD-10-CM | POA: Diagnosis not present

## 2022-05-15 DIAGNOSIS — M5481 Occipital neuralgia: Secondary | ICD-10-CM | POA: Diagnosis not present

## 2022-05-15 DIAGNOSIS — M7918 Myalgia, other site: Secondary | ICD-10-CM | POA: Diagnosis not present

## 2022-05-15 DIAGNOSIS — N1832 Chronic kidney disease, stage 3b: Secondary | ICD-10-CM | POA: Diagnosis not present

## 2022-05-15 DIAGNOSIS — I130 Hypertensive heart and chronic kidney disease with heart failure and stage 1 through stage 4 chronic kidney disease, or unspecified chronic kidney disease: Secondary | ICD-10-CM | POA: Diagnosis not present

## 2022-05-15 DIAGNOSIS — M545 Low back pain, unspecified: Secondary | ICD-10-CM | POA: Diagnosis not present

## 2022-05-15 DIAGNOSIS — I5030 Unspecified diastolic (congestive) heart failure: Secondary | ICD-10-CM | POA: Diagnosis not present

## 2022-05-16 DIAGNOSIS — M810 Age-related osteoporosis without current pathological fracture: Secondary | ICD-10-CM | POA: Diagnosis not present

## 2022-05-16 DIAGNOSIS — E039 Hypothyroidism, unspecified: Secondary | ICD-10-CM | POA: Diagnosis not present

## 2022-05-16 DIAGNOSIS — I5030 Unspecified diastolic (congestive) heart failure: Secondary | ICD-10-CM | POA: Diagnosis not present

## 2022-05-16 DIAGNOSIS — E785 Hyperlipidemia, unspecified: Secondary | ICD-10-CM

## 2022-05-16 DIAGNOSIS — F32A Depression, unspecified: Secondary | ICD-10-CM

## 2022-05-16 DIAGNOSIS — N1832 Chronic kidney disease, stage 3b: Secondary | ICD-10-CM

## 2022-05-16 DIAGNOSIS — I130 Hypertensive heart and chronic kidney disease with heart failure and stage 1 through stage 4 chronic kidney disease, or unspecified chronic kidney disease: Secondary | ICD-10-CM

## 2022-05-23 ENCOUNTER — Inpatient Hospital Stay: Payer: Medicare Other | Attending: Oncology

## 2022-05-23 DIAGNOSIS — N179 Acute kidney failure, unspecified: Secondary | ICD-10-CM

## 2022-05-23 DIAGNOSIS — Z79899 Other long term (current) drug therapy: Secondary | ICD-10-CM | POA: Diagnosis not present

## 2022-05-23 DIAGNOSIS — D649 Anemia, unspecified: Secondary | ICD-10-CM | POA: Diagnosis not present

## 2022-05-23 DIAGNOSIS — R768 Other specified abnormal immunological findings in serum: Secondary | ICD-10-CM | POA: Diagnosis not present

## 2022-05-23 LAB — CBC WITH DIFFERENTIAL/PLATELET
Abs Immature Granulocytes: 0.04 10*3/uL (ref 0.00–0.07)
Basophils Absolute: 0 10*3/uL (ref 0.0–0.1)
Basophils Relative: 1 %
Eosinophils Absolute: 0.3 10*3/uL (ref 0.0–0.5)
Eosinophils Relative: 4 %
HCT: 35.4 % — ABNORMAL LOW (ref 36.0–46.0)
Hemoglobin: 11.7 g/dL — ABNORMAL LOW (ref 12.0–15.0)
Immature Granulocytes: 1 %
Lymphocytes Relative: 31 %
Lymphs Abs: 2 10*3/uL (ref 0.7–4.0)
MCH: 31.9 pg (ref 26.0–34.0)
MCHC: 33.1 g/dL (ref 30.0–36.0)
MCV: 96.5 fL (ref 80.0–100.0)
Monocytes Absolute: 0.4 10*3/uL (ref 0.1–1.0)
Monocytes Relative: 6 %
Neutro Abs: 3.8 10*3/uL (ref 1.7–7.7)
Neutrophils Relative %: 57 %
Platelets: 197 10*3/uL (ref 150–400)
RBC: 3.67 MIL/uL — ABNORMAL LOW (ref 3.87–5.11)
RDW: 14 % (ref 11.5–15.5)
WBC: 6.6 10*3/uL (ref 4.0–10.5)
nRBC: 0 % (ref 0.0–0.2)

## 2022-05-23 LAB — COMPREHENSIVE METABOLIC PANEL
ALT: 30 U/L (ref 0–44)
AST: 27 U/L (ref 15–41)
Albumin: 3.8 g/dL (ref 3.5–5.0)
Alkaline Phosphatase: 52 U/L (ref 38–126)
Anion gap: 9 (ref 5–15)
BUN: 38 mg/dL — ABNORMAL HIGH (ref 8–23)
CO2: 30 mmol/L (ref 22–32)
Calcium: 9.1 mg/dL (ref 8.9–10.3)
Chloride: 95 mmol/L — ABNORMAL LOW (ref 98–111)
Creatinine, Ser: 1.35 mg/dL — ABNORMAL HIGH (ref 0.44–1.00)
GFR, Estimated: 38 mL/min — ABNORMAL LOW (ref >60)
Glucose, Bld: 105 mg/dL — ABNORMAL HIGH (ref 70–99)
Potassium: 3.8 mmol/L (ref 3.5–5.1)
Sodium: 134 mmol/L — ABNORMAL LOW (ref 135–145)
Total Bilirubin: 0.8 mg/dL (ref 0.3–1.2)
Total Protein: 6.8 g/dL (ref 6.5–8.1)

## 2022-05-23 LAB — IRON AND TIBC
Iron: 156 ug/dL (ref 28–170)
Saturation Ratios: 50 % — ABNORMAL HIGH (ref 10.4–31.8)
TIBC: 311 ug/dL (ref 250–450)
UIBC: 155 ug/dL

## 2022-05-23 LAB — FERRITIN: Ferritin: 75 ng/mL (ref 11–307)

## 2022-05-26 LAB — KAPPA/LAMBDA LIGHT CHAINS
Kappa free light chain: 44.8 mg/L — ABNORMAL HIGH (ref 3.3–19.4)
Kappa, lambda light chain ratio: 1.87 — ABNORMAL HIGH (ref 0.26–1.65)
Lambda free light chains: 23.9 mg/L (ref 5.7–26.3)

## 2022-05-27 ENCOUNTER — Inpatient Hospital Stay (HOSPITAL_BASED_OUTPATIENT_CLINIC_OR_DEPARTMENT_OTHER): Payer: Medicare Other | Admitting: Oncology

## 2022-05-27 ENCOUNTER — Ambulatory Visit: Payer: Self-pay

## 2022-05-27 DIAGNOSIS — R768 Other specified abnormal immunological findings in serum: Secondary | ICD-10-CM

## 2022-05-27 NOTE — Telephone Encounter (Signed)
Per Mikey Kirschner, PA-C and Dr. Rosanna Randy, patient should take '80mg'$  of Lasix in the morning, and '40mg'$  in the afternoon. Patient should follow up in the office the end of this week or early next week.   I called and advised patient's daughter in law [name] of this (ok per DPR). [name] verbalized understanding and agreed with recommendations. Follow up appointment scheduled with Tally Joe, FNP on 06/04/2022.

## 2022-05-27 NOTE — Telephone Encounter (Signed)
     Chief Complaint: Daughter reports pt. Has gained weight, has CHF. From 158 lbs. To 162 lbs. "Dr. B usually double her fluid pill for a couple days."  Symptoms: Swelling to feet and ankles, abdomen Frequency: Today Pertinent Negatives: Patient denies No SOB or chest pain Disposition: '[]'$ ED /'[]'$ Urgent Care (no appt availability in office) / '[]'$ Appointment(In office/virtual)/ '[]'$  [place] [facility]/ '[]'$ Home Care/ '[]'$ Refused Recommended Disposition /'[]'$ [place] [facility]/ '[x]'$  Follow-up with PCP Additional Notes: Please advise daughter. Instructed to call 911 for SOB, chest pain.  Answer Assessment - Initial Assessment Questions 1. REASON FOR CALL: "What is your main concern right now?"     Pt. Has a weight gain from 158lbs to 162 lbs. 2. ONSET: "When did the  start?"     Today 3. SEVERITY: "How bad is the ?"     Feet and ankles are swollen 4. FEVER: "Do you have a fever?"     No 5. OTHER SYMPTOMS: "Do you have any other new symptoms?"     Abdomen is puffy 6. TREATMENTS AND RESPONSE: "What have you done so far to try to make this better? What medicines have you used?"     No 7. PREGNANCY: "Is there any chance you are pregnant?" "When was your last menstrual period?"     No  Protocols used: No Guideline Available-A-AH

## 2022-05-29 ENCOUNTER — Encounter: Payer: Self-pay | Admitting: Oncology

## 2022-05-29 NOTE — Progress Notes (Signed)
I connected with Galvin Proffer on 05/29/22 at  3:15 PM EDT by telephone visit and verified that I am speaking with the correct person using two identifiers.   I discussed the limitations, risks, security and privacy concerns of performing an evaluation and management service by telemedicine and the availability of in-person appointments. I also discussed with the patient that there may be a patient responsible charge related to this service. The patient expressed understanding and agreed to proceed.  Other persons participating in the visit and their role in the encounter:  patients daughter  Patient's location:  home Provider's location:  work  Risk analyst Complaint:  discuss results of bloodwork  History of present illness: Patient is a 88 year old female who was last seen by me in November 2019.  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  She was noted to have IgG lambda M protein on immunofixation but not on SPEP.  Repeat myeloma studies in May 2018 did not show any M protein in both SPEP and IFE and therefore patient was discharged from my clinic.  She is presently seeing rheumatology after she was found to have morphea over her right thigh which is being treated by dermatology.  She was worked up for possible systemic sclerosis by rheumatology but was not found to have the same.  Results of blood work from 05/23/2022 were as follows: CBC showed white count of 6.6, H&H of 11.7/35.4 with a platelet count of 197.  Ferritin levels were normal at 75 with normal iron studies.  Free light chain kappa was elevated at 44.8 with a ratio 1.87 myeloma panel in the past has not shown any M protein  Interval history patient has ongoing fatigue.  History of recurrent pressure ulcers on her buttocks.   Review of Systems  Constitutional:  Positive for malaise/fatigue. Negative for chills, fever and weight loss.  HENT:  Negative for congestion, ear discharge and nosebleeds.   Eyes:  Negative for  blurred vision.  Respiratory:  Negative for cough, hemoptysis, sputum production, shortness of breath and wheezing.   Cardiovascular:  Negative for chest pain, palpitations, orthopnea and claudication.  Gastrointestinal:  Negative for abdominal pain, blood in stool, constipation, diarrhea, heartburn, melena, nausea and vomiting.  Genitourinary:  Negative for dysuria, flank pain, frequency, hematuria and urgency.  Musculoskeletal:  Negative for back pain, joint pain and myalgias.  Skin:  Negative for rash.  Neurological:  Negative for dizziness, tingling, focal weakness, seizures, weakness and headaches.  Endo/Heme/Allergies:  Does not bruise/bleed easily.  Psychiatric/Behavioral:  Negative for depression and suicidal ideas. The patient does not have insomnia.     Allergies  Allergen Reactions   Amoxicillin    Clindamycin/Lincomycin    Doxycycline    Levaquin [Levofloxacin In D5w]    Mirabegron Nausea And Vomiting   Nsaids Other (See Comments)    GI upset   Oxycodone Nausea Only    Other reaction(s): Hallucination   Phenergan [Promethazine Hcl]    Singulair [Montelukast]    Vicodin [Hydrocodone-Acetaminophen]    Zocor [Simvastatin]    Zoloft  [Sertraline] Other (See Comments)   Ampicillin Rash   Bactrim [Sulfamethoxazole-Trimethoprim] Rash   Levofloxacin Other (See Comments)    Weakness   Penicillins Rash    Ampicillin, Clindamycin, Doxycycline Levaquin-severe headache, muscle and joint aches, weak   Tolmetin     Other reaction(s): Other (See Comments) GI upset    Past Medical History:  Diagnosis Date   Abnormal SPEP    Allergic  rhinitis, cause unspecified    Allergic rhinitis, cause unspecified    Atherosclerosis of aorta (HCC)    Back pain    lumbar   Degeneration of lumbar or lumbosacral intervertebral disc    Diverticulitis of colon (without mention of hemorrhage)(562.11)    Dizziness and giddiness    Dysthymic disorder    Headache(784.0)    Myalgia and  myositis, unspecified    Osteoporosis, unspecified    Other and unspecified hyperlipidemia    Other diseases of nasal cavity and sinuses(478.19)    Other specified disorders of bladder    Syncope    Trigger finger (acquired)    Unspecified essential hypertension    Unspecified sleep apnea    Urinary frequency     Past Surgical History:  Procedure Laterality Date   ABDOMINAL HYSTERECTOMY     ADENOIDECTOMY     APPENDECTOMY     ARTHROSCOPIC KNEE SURGERY     BREAST BIOPSY Right    lymph node surgery, benign    BREAST EXCISIONAL BIOPSY     CARDIAC CATHETERIZATION     [facility]   CARPAL TUNNEL RELEASE     CATARACT EXTRACTION W/ INTRAOCULAR LENS IMPLANT & ANTERIOR VITRECTOMY, BILATERAL     CHOLECYSTECTOMY     KNEE ARTHROPLASTY Right 01/30/2016   Procedure: COMPUTER ASSISTED TOTAL KNEE ARTHROPLASTY;  Surgeon: Dereck Leep, MD;  Location: [facility] ORS;  Service: Orthopedics;  Laterality: Right;   R/O Lymph Nodes; Right Axilla     ROTATOR CUFF REPAIR Right    SPG block  03/20/2016   sphenopalatine ganglion and maxillary division of trigeminal nerve block block Dr. Manuella Ghazi   TEMPORAL ARTERY BIOPSY / LIGATION     TONSILLECTOMY     TUBAL LIGATION      Social History   Socioeconomic History   Marital status: Widowed    Spouse name: Not on file   Number of children: 3   Years of education: Not on file   Highest education level: Some college, no degree  Occupational History   Occupation: retired  Tobacco Use   Smoking status: Never   Smokeless tobacco: Never  Vaping Use   Vaping Use: Never used  Substance and Sexual Activity   Alcohol use: No   Drug use: No   Sexual activity: Not on file  Other Topics Concern   Not on file  Social History Narrative   Not on file   Social Determinants of Health   Financial Resource Strain: Low Risk  (05/06/2022)   Overall Financial Resource Strain (CARDIA)    Difficulty of Paying Living Expenses: Not hard at all  Food Insecurity: No Food  Insecurity (05/06/2022)   Hunger Vital Sign    Worried About Running Out of Food in the Last Year: Never true    [place] in the Last Year: Never true  Transportation Needs: No Transportation Needs (05/06/2022)   PRAPARE - Hydrologist (Medical): No    Lack of Transportation (Non-Medical): No  Physical Activity: Insufficiently Active (05/06/2022)   Exercise Vital Sign    Days of Exercise per Week: 2 days    Minutes of Exercise per Session: 20 min  Stress: No Stress Concern Present (05/06/2022)   [place]    Feeling of Stress : Not at all  Social Connections: Moderately Isolated (05/06/2022)   Social Connection and Isolation Panel [NHANES]    Frequency of Communication with Friends and Family:  More than three times a week    Frequency of Social Gatherings with Friends and Family: Once a week    Attends Religious Services: More than 4 times per year    Active Member of [employer] or Organizations: No    Attends Archivist Meetings: Never    Marital Status: Widowed  Intimate Partner Violence: Not At Risk (05/06/2022)   Humiliation, Afraid, Rape, and Kick questionnaire    Fear of Current or Ex-Partner: No    Emotionally Abused: No    Physically Abused: No    Sexually Abused: No    Family History  Problem Relation Age of Onset   Heart disease Mother    Depression Mother    Colon polyps Mother    Dementia Mother    Heart disease Father    Dementia Father    Heart disease Brother    Hypertension Brother    Heart disease Brother    Hypertension Brother    Kidney cancer Neg Hx    Bladder Cancer Neg Hx      Current Outpatient Medications:    acetaminophen (TYLENOL) 500 MG tablet, Take 500 mg by mouth in the morning and at bedtime. , Disp: , Rfl:    ALPRAZolam (XANAX) 0.25 MG tablet, TAKE 1 TABLET BY MOUTH TWICE DAILY AS NEEDED FOR ANXIETY, Disp: 60 tablet, Rfl: 0   aspirin EC  81 MG tablet, Take 81 mg by mouth daily., Disp: , Rfl:    atorvastatin (LIPITOR) 20 MG tablet, TAKE 1 TABLET(20 MG) BY MOUTH DAILY, Disp: 90 tablet, Rfl: 1   BREO ELLIPTA 100-25 MCG/ACT AEPB, INHALE 1 PUFF INTO THE LUNGS DAILY, Disp: 180 each, Rfl: 1   Calcium Carb-Cholecalciferol 600-25 MG-MCG CAPS, Take by mouth. (Patient not taking: Reported on 05/06/2022), Disp: , Rfl:    cetirizine (ZYRTEC) 10 MG tablet, 1 tablet 1 (one) time each day, Disp: , Rfl:    Cholecalciferol (VITAMIN D3 PO), Take 1,000 Units by mouth., Disp: , Rfl:    Cholecalciferol 25 MCG (1000 UT) tablet, Take by mouth., Disp: , Rfl:    citalopram (CELEXA) 40 MG tablet, Take 1 tablet (40 mg total) by mouth daily., Disp: 90 tablet, Rfl: 1   cyanocobalamin 1000 MCG tablet, Take 1,000 mcg by mouth daily., Disp: , Rfl:    Dentifrices (FLUORIDE TOOTHPASTE DT), Place onto teeth daily. , Disp: , Rfl:    diphenhydrAMINE (BENADRYL) 25 MG tablet, Take 25 mg by mouth at bedtime., Disp: , Rfl:    diphenhydrAMINE (SOMINEX) 25 MG tablet, Take by mouth. (Patient not taking: Reported on 05/06/2022), Disp: , Rfl:    EMGALITY, 300 MG DOSE, 100 MG/ML SOSY, SMARTSIG:300 Milligram(s) SUB-Q Once a Month, Disp: , Rfl:    famotidine (PEPCID) 20 MG tablet, TAKE 1 TABLET(20 MG) BY MOUTH DAILY, Disp: 90 tablet, Rfl: 1   ferrous sulfate 325 (65 FE) MG tablet, Take by mouth., Disp: , Rfl:    fluticasone furoate-vilanterol (BREO ELLIPTA) 100-25 MCG/ACT AEPB, 1 puff 1 (one) time each day, Disp: , Rfl:    folic acid (FOLVITE) 1 MG tablet, Take 1 mg by mouth daily., Disp: , Rfl:    furosemide (LASIX) 80 MG tablet, TAKE 1 TABLET(80 MG) BY MOUTH DAILY, Disp: 90 tablet, Rfl: 1   gabapentin (NEURONTIN) 100 MG capsule, Take 100 mg by mouth. Take 200 mg in morning and afternoon, Disp: , Rfl:    gabapentin (NEURONTIN) 300 MG capsule, Take 1 capsule (300 mg total) by mouth at bedtime., Disp:  90 capsule, Rfl: 1   Galcanezumab-gnlm (EMGALITY ), Inject 300 mg as directed  every 30 (thirty) days. , Disp: , Rfl: 0   Galcanezumab-gnlm (EMGALITY, 300 MG DOSE,) 100 MG/ML SOSY, Inject into the skin., Disp: , Rfl:    hydroxychloroquine (PLAQUENIL) 200 MG tablet, Take 200 mg by mouth daily., Disp: , Rfl:    levothyroxine (SYNTHROID) 50 MCG tablet, TAKE 1 TABLET(50 MCG) BY MOUTH DAILY BEFORE BREAKFAST, Disp: 90 tablet, Rfl: 0   lisinopril (ZESTRIL) 5 MG tablet, Take 1 tablet (5 mg total) by mouth daily., Disp: 90 tablet, Rfl: 1   MAGNESIUM GLYCINATE PO, Take 400 mg by mouth daily., Disp: , Rfl:    magnesium hydroxide (MILK OF MAGNESIA) 400 MG/5ML suspension, Take 5 mLs by mouth daily as needed for mild constipation., Disp: , Rfl:    melatonin 5 MG TABS, Take 5 mg by mouth at bedtime as needed., Disp: , Rfl:    methotrexate (RHEUMATREX) 2.5 MG tablet, Take 10 mg by mouth once a week., Disp: , Rfl:    Omega-3 Fatty Acids (FISH OIL) 1000 MG CAPS, Take by mouth., Disp: , Rfl:    omeprazole (PRILOSEC) 20 MG capsule, TAKE 1 CAPSULE(20 MG) BY MOUTH DAILY, Disp: 90 capsule, Rfl: 1   phenazopyridine (AZO-TABS) 95 MG tablet, Take 1 tablet (95 mg total) by mouth as needed. (Patient taking differently: Take 95 mg by mouth 2 (two) times daily.), Disp: 30 tablet, Rfl: 3   Polyethylene Glycol 3350 (MIRALAX PO), Take by mouth., Disp: , Rfl:    Potassium Gluconate 2.5 MEQ TABS, 99 mg 2 (two) times a day, Disp: , Rfl:    potassium gluconate 595 (99 K) MG TABS tablet, Take 1 tablet (595 mg total) by mouth 2 (two) times daily., Disp: 180 tablet, Rfl: 1   predniSONE (DELTASONE) 5 MG tablet, Take by mouth. (Patient not taking: Reported on 05/06/2022), Disp: , Rfl:    sodium chloride (OCEAN) 0.65 % nasal spray, Place 1 spray into the nose as needed., Disp: , Rfl:    traMADol (ULTRAM) 50 MG tablet, TAKE 1 TABLET BY MOUTH TWICE DAILY AS NEEDED (Patient taking differently: Take 50 mg by mouth 3 (three) times daily. TAKE 1 TABLET BY MOUTH 3 times daily), Disp: 180 tablet, Rfl: 1   triamcinolone cream  (KENALOG) 0.1 %, 2 weeks on and off 2 weeks, Disp: , Rfl:   No results found.  No images are attached to the encounter.      Latest Ref Rng & Units 05/23/2022    9:51 AM  CMP  Glucose 70 - 99 mg/dL 105   BUN 8 - 23 mg/dL 38   Creatinine 0.44 - 1.00 mg/dL 1.35   Sodium 135 - 145 mmol/L 134   Potassium 3.5 - 5.1 mmol/L 3.8   Chloride 98 - 111 mmol/L 95   CO2 22 - 32 mmol/L 30   Calcium 8.9 - 10.3 mg/dL 9.1   Total Protein 6.5 - 8.1 g/dL 6.8   Total Bilirubin 0.3 - 1.2 mg/dL 0.8   Alkaline Phos 38 - 126 U/L 52   AST 15 - 41 U/L 27   ALT 0 - 44 U/L 30       Latest Ref Rng & Units 05/23/2022    9:51 AM  CBC  WBC 4.0 - 10.5 K/uL 6.6   Hemoglobin 12.0 - 15.0 g/dL 11.7   Hematocrit 36.0 - 46.0 % 35.4   Platelets 150 - 400 K/uL 197     Assessment  and plan: Patient is a 88 year old female referred for possible MGUS.  Patient does not have any M protein myeloma panel and serum free light chain ratio is mildly elevated at 2.5.  No crab criteria.  Patient does not require a bone marrow biopsy at this time.  This can be monitored conservatively with repeat labs in 6 months followed by a telephone visit given her age and frailty.  Follow-up instructions:as above  I discussed the assessment and treatment plan with the patient. The patient was provided an opportunity to ask questions and all were answered. The patient agreed with the plan and demonstrated an understanding of the instructions.   The patient was advised to call back or seek an in-person evaluation if the symptoms worsen or if the condition fails to improve as anticipated.  I provided 12 minutes of non face-to-face telephone visit time during this encounter. Time spent in reviewing labs, care plan and telephone concversation with the patient  Visit Diagnosis: 1. Elevated serum immunoglobulin free light chains     Dr. Randa Evens, MD, MPH [facility] Tel- [phone] 05/29/2022 10:05 AM

## 2022-05-29 NOTE — Progress Notes (Signed)
I,Sulibeya S Dimas,acting as a Neurosurgeon for Jacky Kindle, FNP.,have documented all relevant documentation on the behalf of Jacky Kindle, FNP,as directed by  Jacky Kindle, FNP while in the presence of Jacky Kindle, FNP.   Established patient visit   Patient: Helen Ramsey   DOB: [birth year]   88 y.o. Female  MRN: [id number] Visit Date: 06/04/2022  Today's healthcare provider: Jacky Kindle, FNP  Introduced to nurse practitioner role and practice setting.  All questions answered.  Discussed provider/patient relationship and expectations.   Chief Complaint  Patient presents with   Depression   Hypertension   Hypothyroidism   Urinary Tract Infection   Subjective    HPI  Depression, Follow-up  She  was last seen for this 6 months ago. Changes made at last visit include no changes.   She reports excellent compliance with treatment. She is not having side effects.   She reports excellent tolerance of treatment. Current symptoms include: depressed mood, difficulty concentrating, fatigue, feelings of worthlessness/guilt, hopelessness, and insomnia She feels she is Unchanged since last visit.     06/04/2022   10:47 AM 05/06/2022   10:22 AM 04/08/2022   10:35 AM  Depression screen PHQ 2/9  Decreased Interest 2 0 1  Down, Depressed, Hopeless 1 0 1  PHQ - 2 Score 3 0 2  Altered sleeping 0 0 2  Tired, decreased energy 3 0 3  Change in appetite 1 0 2  Feeling bad or failure about yourself  0 0 0  Trouble concentrating 2 0 0  Moving slowly or fidgety/restless 1 0 1  Suicidal thoughts 0 0 0  PHQ-9 Score 10 0 10  Difficult doing work/chores Very difficult Not difficult at all Somewhat difficult    ----------------------------------------------------------------------------------------- Hypothyroid, follow-up  Lab Results  Component Value Date   TSH 3.350 12/06/2021   TSH 6.610 (H) 08/15/2021   TSH 3.900 05/16/2021    Wt Readings from Last 3 Encounters:  06/04/22  163 lb (73.9 kg)  05/06/22 159 lb (72.1 kg)  04/08/22 159 lb 14.4 oz (72.5 kg)    She was last seen for hypothyroid 6 months ago.  Management since that visit includes no changes. She reports excellent compliance with treatment. She is not having side effects.   Symptoms: Yes change in energy level Yes constipation  Yes diarrhea No heat / cold intolerance  No nervousness No palpitations  No weight changes    ----------------------------------------------------------------------------------------- Hypertension, follow-up  BP Readings from Last 3 Encounters:  06/04/22 (!) 103/44  04/08/22 (!) 116/50  02/28/22 (!) 116/50   Wt Readings from Last 3 Encounters:  06/04/22 163 lb (73.9 kg)  05/06/22 159 lb (72.1 kg)  04/08/22 159 lb 14.4 oz (72.5 kg)     She was last seen for hypertension 6 months ago.  BP at that visit was 120/58. Management since that visit includes no changes.  She reports excellent compliance with treatment. She is not having side effects.  She is following a Low Sodium diet. She is not exercising. She does not smoke.  Use of agents associated with hypertension: none.   Outside blood pressures are stable. Symptoms: No chest pain No chest pressure  No palpitations No syncope  No dyspnea No orthopnea  No paroxysmal nocturnal dyspnea Yes lower extremity edema   Pertinent labs Lab Results  Component Value Date   CHOL 182 08/15/2021   HDL 28 (L) 08/15/2021   LDLCALC 93 08/15/2021  TRIG 367 (H) 08/15/2021   CHOLHDL 6.5 (H) 08/15/2021   Lab Results  Component Value Date   NA 134 (L) 05/23/2022   K 3.8 05/23/2022   CREATININE 1.35 (H) 05/23/2022   GFRNONAA 38 (L) 05/23/2022   GLUCOSE 105 (H) 05/23/2022   TSH 3.350 12/06/2021     The ASCVD Risk score (Arnett DK, et al., 2019) failed to calculate for the following reasons:   The 2019 ASCVD risk score is only valid for ages 58 to 63   The patient has a prior MI or stroke  diagnosis  --------------------------------------------------------------------------------------------------- Urinary symptoms  She reports new onset dysuria and urinary frequency. The current episode started a few days ago and is gradually improving. Patient states symptoms are mild in intensity, occurring intermittently. She  has not been recently treated for similar symptoms. Patient reports taking AZO daily.   Associated symptoms: No abdominal pain No back pain  No chills Yes constipation  No cramping Yes diarrhea  No discharge No fever  No hematuria Yes nausea  No vomiting    ---------------------------------------------------------------------------------------   Medications: Outpatient Medications Prior to Visit  Medication Sig   acetaminophen (TYLENOL) 500 MG tablet Take 500 mg by mouth in the morning and at bedtime.    ALPRAZolam (XANAX) 0.25 MG tablet TAKE 1 TABLET BY MOUTH TWICE DAILY AS NEEDED FOR ANXIETY   aspirin EC 81 MG tablet Take 81 mg by mouth daily.   atorvastatin (LIPITOR) 20 MG tablet TAKE 1 TABLET(20 MG) BY MOUTH DAILY   BREO ELLIPTA 100-25 MCG/ACT AEPB INHALE 1 PUFF INTO THE LUNGS DAILY   Calcium Carb-Cholecalciferol 600-25 MG-MCG CAPS Take by mouth.   cetirizine (ZYRTEC) 10 MG tablet 1 tablet 1 (one) time each day   Cholecalciferol (VITAMIN D3 PO) Take 1,000 Units by mouth.   Cholecalciferol 25 MCG (1000 UT) tablet Take by mouth.   citalopram (CELEXA) 40 MG tablet Take 1 tablet (40 mg total) by mouth daily.   cyanocobalamin 1000 MCG tablet Take 1,000 mcg by mouth daily.   Dentifrices (FLUORIDE TOOTHPASTE DT) Place onto teeth daily.    diphenhydrAMINE (BENADRYL) 25 MG tablet Take 25 mg by mouth at bedtime.   diphenhydrAMINE (SOMINEX) 25 MG tablet Take by mouth.   EMGALITY, 300 MG DOSE, 100 MG/ML SOSY SMARTSIG:300 Milligram(s) SUB-Q Once a Month   famotidine (PEPCID) 20 MG tablet TAKE 1 TABLET(20 MG) BY MOUTH DAILY   ferrous sulfate 325 (65 FE) MG tablet  Take by mouth.   fluticasone furoate-vilanterol (BREO ELLIPTA) 100-25 MCG/ACT AEPB 1 puff 1 (one) time each day   folic acid (FOLVITE) 1 MG tablet Take 1 mg by mouth daily.   furosemide (LASIX) 80 MG tablet TAKE 1 TABLET(80 MG) BY MOUTH DAILY   gabapentin (NEURONTIN) 100 MG capsule Take 100 mg by mouth. Take 200 mg in morning and afternoon   gabapentin (NEURONTIN) 300 MG capsule Take 1 capsule (300 mg total) by mouth at bedtime.   Galcanezumab-gnlm (EMGALITY [place]) Inject 300 mg as directed every 30 (thirty) days.    Galcanezumab-gnlm (EMGALITY, 300 MG DOSE,) 100 MG/ML SOSY Inject into the skin.   hydroxychloroquine (PLAQUENIL) 200 MG tablet Take 200 mg by mouth daily.   levothyroxine (SYNTHROID) 50 MCG tablet TAKE 1 TABLET(50 MCG) BY MOUTH DAILY BEFORE BREAKFAST   lisinopril (ZESTRIL) 5 MG tablet Take 1 tablet (5 mg total) by mouth daily.   MAGNESIUM GLYCINATE PO Take 400 mg by mouth daily.   magnesium hydroxide (MILK OF MAGNESIA) 400 MG/5ML suspension  Take 5 mLs by mouth daily as needed for mild constipation.   melatonin 5 MG TABS Take 5 mg by mouth at bedtime as needed.   methotrexate (RHEUMATREX) 2.5 MG tablet Take 10 mg by mouth once a week.   Omega-3 Fatty Acids (FISH OIL) 1000 MG CAPS Take by mouth.   omeprazole (PRILOSEC) 20 MG capsule TAKE 1 CAPSULE(20 MG) BY MOUTH DAILY   phenazopyridine (AZO-TABS) 95 MG tablet Take 1 tablet (95 mg total) by mouth as needed. (Patient taking differently: Take 95 mg by mouth 2 (two) times daily.)   Polyethylene Glycol 3350 (MIRALAX PO) Take by mouth.   Potassium Gluconate 2.5 MEQ TABS 99 mg 2 (two) times a day   potassium gluconate 595 (99 K) MG TABS tablet Take 1 tablet (595 mg total) by mouth 2 (two) times daily.   sodium chloride (OCEAN) 0.65 % nasal spray Place 1 spray into the nose as needed.   traMADol (ULTRAM) 50 MG tablet TAKE 1 TABLET BY MOUTH TWICE DAILY AS NEEDED (Patient taking differently: Take 50 mg by mouth 3 (three) times daily. TAKE 1  TABLET BY MOUTH 3 times daily)   triamcinolone cream (KENALOG) 0.1 % 2 weeks on and off 2 weeks   [DISCONTINUED] predniSONE (DELTASONE) 5 MG tablet Take by mouth.   No facility-administered medications prior to visit.    Review of Systems  Constitutional:  Positive for fatigue. Negative for activity change, appetite change and unexpected weight change.  Respiratory:  Positive for shortness of breath. Negative for cough and chest tightness.   Cardiovascular:  Positive for leg swelling. Negative for chest pain and palpitations.  Gastrointestinal:  Positive for abdominal pain, constipation, diarrhea, nausea and vomiting.  Genitourinary:  Positive for dysuria and frequency.        Objective    BP (!) 103/44 (BP Location: Right Arm, Patient Position: Sitting, Cuff Size: Normal)   Pulse 64   Temp 98.2 F (36.8 C) (Oral)   Resp 16   Wt 163 lb (73.9 kg)   SpO2 97% Comment: 2L oxygen  BMI 33.20 kg/m  BP Readings from Last 3 Encounters:  06/04/22 (!) 103/44  04/08/22 (!) 116/50  02/28/22 (!) 116/50   Wt Readings from Last 3 Encounters:  06/04/22 163 lb (73.9 kg)  05/06/22 159 lb (72.1 kg)  04/08/22 159 lb 14.4 oz (72.5 kg)      Physical Exam Vitals and nursing note reviewed.  Constitutional:      General: She is not in acute distress.    Appearance: Normal appearance. She is obese. She is not ill-appearing, toxic-appearing or diaphoretic.  HENT:     Head: Normocephalic and atraumatic.  Cardiovascular:     Rate and Rhythm: Normal rate and regular rhythm.     Pulses: Normal pulses.     Heart sounds: Murmur heard.     No friction rub. No gallop.  Pulmonary:     Effort: Pulmonary effort is normal. Tachypnea present. No respiratory distress.     Breath sounds: No stridor. Examination of the right-lower field reveals decreased breath sounds. Examination of the left-lower field reveals decreased breath sounds. Decreased breath sounds present. No wheezing, rhonchi or rales.   Chest:     Chest wall: No tenderness.  Abdominal:     General: Bowel sounds are normal.     Palpations: Abdomen is soft.  Musculoskeletal:        General: Swelling present. No tenderness, deformity or signs of injury. Normal range of motion.  Right lower leg: 2+ Pitting Edema present.     Left lower leg: 1+ Pitting Edema present.  Skin:    General: Skin is warm and dry.     Capillary Refill: Capillary refill takes less than 2 seconds.     Coloration: Skin is not jaundiced or pale.     Findings: No bruising, erythema, lesion or rash.  Neurological:     General: No focal deficit present.     Mental Status: She is alert and oriented to person, place, and time. Mental status is at baseline.     Cranial Nerves: No cranial nerve deficit.     Sensory: No sensory deficit.     Motor: No weakness.     Coordination: Coordination normal.  Psychiatric:        Mood and Affect: Mood normal.        Behavior: Behavior normal.        Thought Content: Thought content normal.        Judgment: Judgment normal.      Results for orders placed or performed in visit on 06/04/22  POCT urinalysis dipstick  Result Value Ref Range   Color, UA clear    Clarity, UA dark    Glucose, UA Negative Negative   Bilirubin, UA Negative    Ketones, UA Negative    Spec Grav, UA 1.015 1.010 - 1.025   Blood, UA Negative    pH, UA 7.5 5.0 - 8.0   Protein, UA Negative Negative   Urobilinogen, UA 0.2 0.2 or 1.0 E.U./dL   Nitrite, UA Negative    Leukocytes, UA Negative Negative    Assessment & Plan     Problem List Items Addressed This Visit       Cardiovascular and Mediastinum   (HFpEF) heart failure with preserved ejection fraction (HCC) - Primary    Chronic, unable to lose weight from excess fluid despite increase in diuretic Repeat CMP Recommend follow up with [place] Encourage water and avoid dietary indiscretions       Relevant Orders   B Nat Peptide   Essential (primary) hypertension     Chronic, stable On 5 mg of lisinopril Denies symptoms of high/low BP Slight LE edema remains; with use of light TED hose Decrease in DBP noted      Relevant Orders   CBC with Differential/Platelet   Comprehensive Metabolic Panel (CMET)     Endocrine   Acquired hypothyroidism    Chronic, previous stable Repeat labs given weight gain and changes in activity      Relevant Orders   TSH + free T4     Nervous and Auditory   MCI (mild cognitive impairment)    Chronic, waxes/wanes Family assists with appts        Musculoskeletal and Integument   Panniculitis    Recommend use of anti fungal powder to assist; avoid use of corn starch Check CBC      Relevant Orders   CBC with Differential/Platelet     Other   Dysuria    UA negative Will send for culture given complaints of symptoms      Relevant Orders   POCT urinalysis dipstick (Completed)   Urine Culture   Recurrent major depressive disorder, in partial remission (HCC)    PHQ elevated; associated with decreased ability and concentration deficits Denies 'depression'        Return if symptoms worsen or fail to improve.      Leilani Merl, FNP, have reviewed  all documentation for this visit. The documentation on 06/04/22 for the exam, diagnosis, procedures, and orders are all accurate and complete.    Jacky Kindle, FNP  [facility] [phone] (phone) [phone] (fax)  [facility]

## 2022-06-04 ENCOUNTER — Ambulatory Visit (INDEPENDENT_AMBULATORY_CARE_PROVIDER_SITE_OTHER): Payer: Medicare Other | Admitting: Family Medicine

## 2022-06-04 ENCOUNTER — Encounter: Payer: Self-pay | Admitting: Family Medicine

## 2022-06-04 VITALS — BP 103/44 | HR 64 | Temp 98.2°F | Resp 16 | Wt 163.0 lb

## 2022-06-04 DIAGNOSIS — G3184 Mild cognitive impairment, so stated: Secondary | ICD-10-CM | POA: Diagnosis not present

## 2022-06-04 DIAGNOSIS — E039 Hypothyroidism, unspecified: Secondary | ICD-10-CM | POA: Diagnosis not present

## 2022-06-04 DIAGNOSIS — M793 Panniculitis, unspecified: Secondary | ICD-10-CM | POA: Insufficient documentation

## 2022-06-04 DIAGNOSIS — F3341 Major depressive disorder, recurrent, in partial remission: Secondary | ICD-10-CM | POA: Diagnosis not present

## 2022-06-04 DIAGNOSIS — I5032 Chronic diastolic (congestive) heart failure: Secondary | ICD-10-CM | POA: Diagnosis not present

## 2022-06-04 DIAGNOSIS — R300 Dysuria: Secondary | ICD-10-CM | POA: Insufficient documentation

## 2022-06-04 DIAGNOSIS — I10 Essential (primary) hypertension: Secondary | ICD-10-CM

## 2022-06-04 LAB — POCT URINALYSIS DIPSTICK
Bilirubin, UA: NEGATIVE
Blood, UA: NEGATIVE
Glucose, UA: NEGATIVE
Ketones, UA: NEGATIVE
Leukocytes, UA: NEGATIVE
Nitrite, UA: NEGATIVE
Protein, UA: NEGATIVE
Spec Grav, UA: 1.015 (ref 1.010–1.025)
Urobilinogen, UA: 0.2 E.U./dL
pH, UA: 7.5 (ref 5.0–8.0)

## 2022-06-04 MED ORDER — NYSTATIN 100000 UNIT/GM EX POWD: 1.0000 | Freq: Three times a day (TID) | CUTANEOUS | 11 refills | Status: DC

## 2022-06-04 NOTE — Assessment & Plan Note (Signed)
PHQ elevated; associated with decreased ability and concentration deficits Denies 'depression'

## 2022-06-04 NOTE — Assessment & Plan Note (Signed)
Chronic, waxes/wanes Family assists with appts

## 2022-06-04 NOTE — Assessment & Plan Note (Signed)
Recommend use of anti fungal powder to assist; avoid use of corn starch Check CBC

## 2022-06-04 NOTE — Assessment & Plan Note (Signed)
Chronic, previous stable Repeat labs given weight gain and changes in activity

## 2022-06-04 NOTE — Assessment & Plan Note (Signed)
UA negative Will send for culture given complaints of symptoms

## 2022-06-04 NOTE — Assessment & Plan Note (Signed)
Chronic, stable On 5 mg of lisinopril Denies symptoms of high/low BP Slight LE edema remains; with use of light TED hose Decrease in DBP noted

## 2022-06-04 NOTE — Assessment & Plan Note (Signed)
Chronic, unable to lose weight from excess fluid despite increase in diuretic Repeat CMP Recommend follow up with [facility] Encourage water and avoid dietary indiscretions

## 2022-06-05 ENCOUNTER — Ambulatory Visit: Payer: Medicare Other | Admitting: Family Medicine

## 2022-06-05 LAB — CBC WITH DIFFERENTIAL/PLATELET
Basophils Absolute: 0 10*3/uL (ref 0.0–0.2)
Basos: 1 %
EOS (ABSOLUTE): 0.2 10*3/uL (ref 0.0–0.4)
Eos: 3 %
Hematocrit: 30.7 % — ABNORMAL LOW (ref 34.0–46.6)
Hemoglobin: 10.3 g/dL — ABNORMAL LOW (ref 11.1–15.9)
Immature Grans (Abs): 0 10*3/uL (ref 0.0–0.1)
Immature Granulocytes: 1 %
Lymphocytes Absolute: 1.4 10*3/uL (ref 0.7–3.1)
Lymphs: 22 %
MCH: 32.3 pg (ref 26.6–33.0)
MCHC: 33.6 g/dL (ref 31.5–35.7)
MCV: 96 fL (ref 79–97)
Monocytes Absolute: 0.7 10*3/uL (ref 0.1–0.9)
Monocytes: 11 %
Neutrophils Absolute: 3.9 10*3/uL (ref 1.4–7.0)
Neutrophils: 62 %
Platelets: 193 10*3/uL (ref 150–450)
RBC: 3.19 x10E6/uL — ABNORMAL LOW (ref 3.77–5.28)
RDW: 14.6 % (ref 11.7–15.4)
WBC: 6.2 10*3/uL (ref 3.4–10.8)

## 2022-06-05 LAB — COMPREHENSIVE METABOLIC PANEL
ALT: 19 IU/L (ref 0–32)
AST: 20 IU/L (ref 0–40)
Albumin/Globulin Ratio: 1.6 (ref 1.2–2.2)
Albumin: 4 g/dL (ref 3.7–4.7)
Alkaline Phosphatase: 55 IU/L (ref 44–121)
BUN/Creatinine Ratio: 21 (ref 12–28)
BUN: 30 mg/dL — ABNORMAL HIGH (ref 8–27)
Bilirubin Total: 0.4 mg/dL (ref 0.0–1.2)
CO2: 27 mmol/L (ref 20–29)
Calcium: 9.2 mg/dL (ref 8.7–10.3)
Chloride: 96 mmol/L (ref 96–106)
Creatinine, Ser: 1.46 mg/dL — ABNORMAL HIGH (ref 0.57–1.00)
Globulin, Total: 2.5 g/dL (ref 1.5–4.5)
Glucose: 83 mg/dL (ref 70–99)
Potassium: 4.3 mmol/L (ref 3.5–5.2)
Sodium: 139 mmol/L (ref 134–144)
Total Protein: 6.5 g/dL (ref 6.0–8.5)
eGFR: 35 mL/min/{1.73_m2} — ABNORMAL LOW (ref >59)

## 2022-06-05 LAB — TSH+FREE T4
Free T4: 1.19 ng/dL (ref 0.82–1.77)
TSH: 2.92 u[IU]/mL (ref 0.450–4.500)

## 2022-06-05 LAB — BRAIN NATRIURETIC PEPTIDE: BNP: 22 pg/mL (ref 0.0–100.0)

## 2022-06-05 NOTE — Progress Notes (Signed)
Iron and anemia have worsened; continue to recommend use of iron rich diet with daily supplement to assist. This will also assist with breathing.   Creatinine is slightly increased; however, not in level of acute kidney injury. This is reassuring. Potassium is normal.  Continue to recommend strict diet with goal of no drinks outside of water to assist with recovery. OK to continue 40 mg every other day to assist in meeting goal; other option of adding low dose spirolactone to assist as I discussed with Dr Rosanna Randy. Please reach out to Dr Clayborn Bigness to see if you can be seen earlier given issues with weight/fluid management and see if they can see you earlier.

## 2022-06-06 ENCOUNTER — Ambulatory Visit: Payer: Medicare Other | Admitting: Family Medicine

## 2022-06-06 LAB — URINE CULTURE: Organism ID, Bacteria: NO GROWTH

## 2022-06-06 NOTE — Progress Notes (Signed)
Negative/no growth seen on urine culture.  Helen Ramsey, [place] [address] [name], [name] [address] [phone] (phone) [phone] (fax) [place]

## 2022-06-10 ENCOUNTER — Ambulatory Visit
Admission: RE | Admit: 2022-06-10 | Discharge: 2022-06-10 | Disposition: A | Discharge status: Home / Self Care | Payer: Medicare Other | Attending: Family Medicine | Admitting: Family Medicine

## 2022-06-10 ENCOUNTER — Ambulatory Visit
Admission: RE | Admit: 2022-06-10 | Discharge: 2022-06-10 | Disposition: A | Discharge status: Home / Self Care | Payer: Medicare Other | Source: Ambulatory Visit | Attending: Nephrology | Admitting: Nephrology

## 2022-06-10 ENCOUNTER — Other Ambulatory Visit: Payer: Self-pay | Admitting: Nephrology

## 2022-06-10 DIAGNOSIS — I129 Hypertensive chronic kidney disease with stage 1 through stage 4 chronic kidney disease, or unspecified chronic kidney disease: Secondary | ICD-10-CM | POA: Insufficient documentation

## 2022-06-10 DIAGNOSIS — R0602 Shortness of breath: Secondary | ICD-10-CM

## 2022-06-10 DIAGNOSIS — R609 Edema, unspecified: Secondary | ICD-10-CM

## 2022-06-10 DIAGNOSIS — N183 Chronic kidney disease, stage 3 unspecified: Secondary | ICD-10-CM | POA: Diagnosis not present

## 2022-06-10 DIAGNOSIS — R600 Localized edema: Secondary | ICD-10-CM | POA: Diagnosis not present

## 2022-06-10 DIAGNOSIS — N1832 Chronic kidney disease, stage 3b: Secondary | ICD-10-CM | POA: Diagnosis not present

## 2022-06-16 DIAGNOSIS — G4733 Obstructive sleep apnea (adult) (pediatric): Secondary | ICD-10-CM | POA: Diagnosis not present

## 2022-06-18 DIAGNOSIS — I509 Heart failure, unspecified: Secondary | ICD-10-CM | POA: Diagnosis not present

## 2022-06-18 DIAGNOSIS — R0602 Shortness of breath: Secondary | ICD-10-CM | POA: Diagnosis not present

## 2022-06-18 DIAGNOSIS — G4733 Obstructive sleep apnea (adult) (pediatric): Secondary | ICD-10-CM | POA: Diagnosis not present

## 2022-06-19 DIAGNOSIS — I129 Hypertensive chronic kidney disease with stage 1 through stage 4 chronic kidney disease, or unspecified chronic kidney disease: Secondary | ICD-10-CM | POA: Diagnosis not present

## 2022-06-19 DIAGNOSIS — R600 Localized edema: Secondary | ICD-10-CM | POA: Diagnosis not present

## 2022-06-19 DIAGNOSIS — N1832 Chronic kidney disease, stage 3b: Secondary | ICD-10-CM | POA: Diagnosis not present

## 2022-06-20 ENCOUNTER — Other Ambulatory Visit: Payer: Self-pay | Admitting: Family Medicine

## 2022-06-20 DIAGNOSIS — E782 Mixed hyperlipidemia: Secondary | ICD-10-CM

## 2022-06-20 NOTE — Telephone Encounter (Signed)
Requested Prescriptions  Pending Prescriptions Disp Refills  . lisinopril (ZESTRIL) 5 MG tablet [Pharmacy Med Name: LISINOPRIL '5MG'$  TABLETS] 90 tablet 1    Sig: TAKE 1 TABLET(5 MG) BY MOUTH DAILY     Cardiovascular:  ACE Inhibitors Failed - 06/20/2022  7:53 AM      Failed - Cr in normal range and within 180 days    Creatinine  Date Value Ref Range Status  06/21/2014 0.83 0.60 - 1.30 mg/dL Final   Creatinine, Ser  Date Value Ref Range Status  06/04/2022 1.46 (H) 0.57 - 1.00 mg/dL Final         Failed - Last BP in normal range    BP Readings from Last 1 Encounters:  06/04/22 (!) 103/44         Passed - K in normal range and within 180 days    Potassium  Date Value Ref Range Status  06/04/2022 4.3 3.5 - 5.2 mmol/L Final  06/21/2014 3.7 3.5 - 5.1 mmol/L Final         Passed - Patient is not pregnant      Passed - Valid encounter within last 6 months    Recent Outpatient Visits          2 weeks ago Chronic heart failure with preserved ejection fraction [facility])   [facility] Tally Joe T, FNP   2 months ago Bilateral calf pain   [facility] [place], [name], MD   3 months ago Acute on chronic heart failure with preserved ejection fraction [facility])   [facility], [name], MD   4 months ago Pressure injury of left buttock, stage 2 [facility])   [facility] [place], [name], MD   6 months ago Essential (primary) hypertension   [facility], [name], MD

## 2022-06-24 DIAGNOSIS — G4733 Obstructive sleep apnea (adult) (pediatric): Secondary | ICD-10-CM | POA: Diagnosis not present

## 2022-06-24 DIAGNOSIS — R0609 Other forms of dyspnea: Secondary | ICD-10-CM | POA: Diagnosis not present

## 2022-06-24 DIAGNOSIS — R0902 Hypoxemia: Secondary | ICD-10-CM | POA: Diagnosis not present

## 2022-06-26 ENCOUNTER — Ambulatory Visit: Payer: Self-pay

## 2022-06-26 DIAGNOSIS — I5032 Chronic diastolic (congestive) heart failure: Secondary | ICD-10-CM

## 2022-06-26 NOTE — Chronic Care Management (AMB) (Signed)
   06/26/2022  Helen Ramsey May 07, 1933 [id number]  Documentation encounter created to complete case transition. The care management team will continue to follow for care coordination.    [place] [facility] [phone]

## 2022-07-02 ENCOUNTER — Ambulatory Visit: Payer: Self-pay

## 2022-07-02 NOTE — Patient Instructions (Addendum)
Visit Information  Thank you for taking time to visit with me today. Please don't hesitate to contact me if I can be of assistance to you.   Following are the goals we discussed today:   Goals Addressed             This Visit's Progress    Health Maintenance       Care Coordination Interventions:            Our next appointment is by telephone on September 12, 2022 at 1pm. Please call the care guide team at [phone] if you need to cancel or reschedule your appointment.    The patient's caregiver verbalized understanding of information discussed during the telephonic outreach. Declined need for mailed instructions or resources.    A member of the care management team will follow up in October.  [place] Management [phone]

## 2022-07-02 NOTE — Patient Outreach (Signed)
Care Coordination   Initial Visit Note   07/02/2022 Name: BUNITA STLAURENT MRN: [id number] DOB: Sep 25, 1933  Geryl Rankins FLORETTE COLAIANNI is a 88 y.o. year old female who sees Bacigalupo, Marzella Schlein, MD for primary care. I spoke with  Stacey Drain 's caregiver by phone today  What matters to the patients health and wellness today?  Health Maintenance      SDOH assessments and interventions completed:  Yes  SDOH Interventions Today    Flowsheet Row Most Recent Value  SDOH Interventions   Food Insecurity Interventions Intervention Not Indicated  Transportation Interventions Intervention Not Indicated        Care Coordination Interventions Activated:  Yes  Care Coordination Interventions:  Yes, provided   Encounter Outcome:  Pt. Visit Completed   [facility] Care Management [phone]

## 2022-07-07 ENCOUNTER — Other Ambulatory Visit: Payer: Self-pay | Admitting: Family Medicine

## 2022-07-07 DIAGNOSIS — R059 Cough, unspecified: Secondary | ICD-10-CM

## 2022-07-07 DIAGNOSIS — R0602 Shortness of breath: Secondary | ICD-10-CM

## 2022-07-11 DIAGNOSIS — R768 Other specified abnormal immunological findings in serum: Secondary | ICD-10-CM | POA: Diagnosis not present

## 2022-07-11 DIAGNOSIS — Z79899 Other long term (current) drug therapy: Secondary | ICD-10-CM | POA: Diagnosis not present

## 2022-07-11 DIAGNOSIS — L940 Localized scleroderma [morphea]: Secondary | ICD-10-CM | POA: Diagnosis not present

## 2022-07-15 DIAGNOSIS — Z79899 Other long term (current) drug therapy: Secondary | ICD-10-CM | POA: Diagnosis not present

## 2022-07-23 DIAGNOSIS — R82998 Other abnormal findings in urine: Secondary | ICD-10-CM | POA: Diagnosis not present

## 2022-07-23 DIAGNOSIS — I129 Hypertensive chronic kidney disease with stage 1 through stage 4 chronic kidney disease, or unspecified chronic kidney disease: Secondary | ICD-10-CM | POA: Diagnosis not present

## 2022-07-23 DIAGNOSIS — N1832 Chronic kidney disease, stage 3b: Secondary | ICD-10-CM | POA: Diagnosis not present

## 2022-07-23 DIAGNOSIS — R600 Localized edema: Secondary | ICD-10-CM | POA: Diagnosis not present

## 2022-07-23 DIAGNOSIS — R0602 Shortness of breath: Secondary | ICD-10-CM | POA: Diagnosis not present

## 2022-07-28 ENCOUNTER — Telehealth: Payer: Medicare Other

## 2022-07-29 ENCOUNTER — Other Ambulatory Visit: Payer: Self-pay | Admitting: Family Medicine

## 2022-07-29 DIAGNOSIS — E559 Vitamin D deficiency, unspecified: Secondary | ICD-10-CM | POA: Diagnosis not present

## 2022-07-29 DIAGNOSIS — E538 Deficiency of other specified B group vitamins: Secondary | ICD-10-CM | POA: Diagnosis not present

## 2022-07-29 DIAGNOSIS — M5481 Occipital neuralgia: Secondary | ICD-10-CM | POA: Diagnosis not present

## 2022-07-29 NOTE — Telephone Encounter (Signed)
Medication Refill - Medication:  gabapentin (NEURONTIN) 100 MG capsule levothyroxine (SYNTHROID) 50 MCG tablet  Has the patient contacted their pharmacy? No.  Preferred Pharmacy (with phone number or street name):  [id number] [address], [place] Phone:  [phone]  Fax:  [phone]     Has the patient been seen for an appointment in the last year OR does the patient have an upcoming appointment? Yes.    Agent: Please be advised that RX refills may take up to 3 business days. We ask that you follow-up with your pharmacy.  Patient is out of medication

## 2022-07-30 MED ORDER — GABAPENTIN 100 MG PO CAPS: ORAL_CAPSULE | ORAL | 1 refills | Status: DC

## 2022-07-30 MED ORDER — LEVOTHYROXINE SODIUM 50 MCG PO TABS: ORAL_TABLET | ORAL | 3 refills | Status: DC

## 2022-07-30 NOTE — Telephone Encounter (Signed)
Requested Prescriptions  Pending Prescriptions Disp Refills  . gabapentin (NEURONTIN) 100 MG capsule      Sig: Take 1 capsule (100 mg total) by mouth. Take 200 mg in morning and afternoon     Neurology: Anticonvulsants - gabapentin Failed - 07/29/2022 11:46 AM      Failed - Cr in normal range and within 360 days    Creatinine  Date Value Ref Range Status  06/21/2014 0.83 0.60 - 1.30 mg/dL Final   Creatinine, Ser  Date Value Ref Range Status  06/04/2022 1.46 (H) 0.57 - 1.00 mg/dL Final         Passed - Completed PHQ-2 or PHQ-9 in the last 360 days      Passed - Valid encounter within last 12 months    Recent Outpatient Visits          1 month ago Chronic heart failure with preserved ejection fraction [facility])   [facility] Tally Joe T, FNP   3 months ago Bilateral calf pain   [facility] [place], [name], MD   5 months ago Acute on chronic heart failure with preserved ejection fraction [facility])   [facility], [name], MD   5 months ago Pressure injury of left buttock, stage 2 [facility])   [facility], [name], MD   7 months ago Essential (primary) hypertension   [facility] Bacigalupo, [name], MD      Future Appointments            In 3 months Bacigalupo, [name], MD [facility]           . levothyroxine (SYNTHROID) 50 MCG tablet 90 tablet 3    Sig: TAKE 1 TABLET(50 MCG) BY MOUTH DAILY BEFORE BREAKFAST     Endocrinology:  Hypothyroid Agents Passed - 07/29/2022 11:46 AM      Passed - TSH in normal range and within 360 days    TSH  Date Value Ref Range Status  06/04/2022 2.920 0.450 - 4.500 uIU/mL Final         Passed - Valid encounter within last 12 months    Recent Outpatient Visits          1 month ago Chronic heart failure with preserved ejection fraction [facility])   [facility] Tally Joe T, FNP   3 months ago Bilateral calf pain    [facility] [place], [name], MD   5 months ago Acute on chronic heart failure with preserved ejection fraction [facility])   [facility], [name], MD   5 months ago Pressure injury of left buttock, stage 2 [facility])   [facility] [place], [name], MD   7 months ago Essential (primary) hypertension   [place] Bacigalupo, [name], MD      Future Appointments            In 3 months Bacigalupo, [name], MD [facility]

## 2022-07-30 NOTE — Telephone Encounter (Signed)
Requested medication (s) are due for refill today: -  Requested medication (s) are on the active medication list: historical med  Last refill:  02/26/22  Future visit scheduled: yes  Notes to clinic:  historical provider   Requested Prescriptions  Pending Prescriptions Disp Refills   gabapentin (NEURONTIN) 100 MG capsule      Sig: Take 1 capsule (100 mg total) by mouth. Take 200 mg in morning and afternoon     Neurology: Anticonvulsants - gabapentin Failed - 07/29/2022 11:46 AM      Failed - Cr in normal range and within 360 days    Creatinine  Date Value Ref Range Status  06/21/2014 0.83 0.60 - 1.30 mg/dL Final   Creatinine, Ser  Date Value Ref Range Status  06/04/2022 1.46 (H) 0.57 - 1.00 mg/dL Final         Passed - Completed PHQ-2 or PHQ-9 in the last 360 days      Passed - Valid encounter within last 12 months    Recent Outpatient Visits           1 month ago Chronic heart failure with preserved ejection fraction [facility])   [facility] Tally Joe T, FNP   3 months ago Bilateral calf pain   [facility] [place], [name], MD   5 months ago Acute on chronic heart failure with preserved ejection fraction [facility])   [facility], [name], MD   5 months ago Pressure injury of left buttock, stage 2 [facility])   [facility], [name], MD   7 months ago Essential (primary) hypertension   [facility] Bacigalupo, [name], MD       Future Appointments             In 3 months Bacigalupo, [name], MD [facility]            Signed Prescriptions Disp Refills   levothyroxine (SYNTHROID) 50 MCG tablet 90 tablet 3    Sig: TAKE 1 TABLET(50 MCG) BY MOUTH DAILY BEFORE BREAKFAST     Endocrinology:  Hypothyroid Agents Passed - 07/29/2022 11:46 AM      Passed - TSH in normal range and within 360 days    TSH  Date Value Ref Range Status  06/04/2022 2.920 0.450 -  4.500 uIU/mL Final         Passed - Valid encounter within last 12 months    Recent Outpatient Visits           1 month ago Chronic heart failure with preserved ejection fraction [facility])   [facility] Tally Joe T, FNP   3 months ago Bilateral calf pain   [facility] [place], [name], MD   5 months ago Acute on chronic heart failure with preserved ejection fraction [facility])   [facility], [name], MD   5 months ago Pressure injury of left buttock, stage 2 [facility])   [facility] [place], [name], MD   7 months ago Essential (primary) hypertension   [place] Bacigalupo, [name], MD       Future Appointments             In 3 months Bacigalupo, [name], MD [facility]

## 2022-07-31 ENCOUNTER — Ambulatory Visit: Payer: Self-pay | Admitting: *Deleted

## 2022-07-31 DIAGNOSIS — I129 Hypertensive chronic kidney disease with stage 1 through stage 4 chronic kidney disease, or unspecified chronic kidney disease: Secondary | ICD-10-CM | POA: Diagnosis not present

## 2022-07-31 DIAGNOSIS — N184 Chronic kidney disease, stage 4 (severe): Secondary | ICD-10-CM | POA: Diagnosis not present

## 2022-07-31 DIAGNOSIS — E876 Hypokalemia: Secondary | ICD-10-CM | POA: Diagnosis not present

## 2022-07-31 DIAGNOSIS — R600 Localized edema: Secondary | ICD-10-CM | POA: Diagnosis not present

## 2022-07-31 NOTE — Telephone Encounter (Signed)
Message from [place] sent at 07/31/2022  3:04 PM EDT  Summary: directions for gabapentin (NEURONTIN) 100 MG capsule   Manuela Schwartz from [place] called in needs clearer directions, for gabapentin (NEURONTIN) 100 MG capsule says 1 in the morning, 1 afternoon and 2 every morning and afternoon. Please call back           Call History   Type Contact Phone/Fax User  07/31/2022 03:01 PM EDT Phone (Incoming) [facility] #[id number] Lorina Rabon, [place] (Pharmacy) [phone]    Reason for Disposition  [1] Pharmacy calling with prescription question AND [2] triager unable to answer question  Answer Assessment - Initial Assessment Questions 1. NAME of MEDICINE: "What medicine(s) are you calling about?"     Gabapentin 2. QUESTION: "What is your question?" (e.g., double dose of medicine, side effect)     Pharmacy at [facility] needs clarification on dosing.  See message in notes. 3. PRESCRIBER: "Who prescribed the medicine?" Reason: if prescribed by specialist, call should be referred to that group.      4. SYMPTOMS: "Do you have any symptoms?" If Yes, ask: "What symptoms are you having?"  "How bad are the symptoms (e.g., mild, moderate, severe)      5. PREGNANCY:  "Is there any chance that you are pregnant?" "When was your last menstrual period?"  Protocols used: Medication Question Call-A-AH

## 2022-08-01 ENCOUNTER — Other Ambulatory Visit: Payer: Self-pay | Admitting: Physician Assistant

## 2022-08-01 ENCOUNTER — Telehealth: Payer: Self-pay

## 2022-08-01 DIAGNOSIS — M5481 Occipital neuralgia: Secondary | ICD-10-CM

## 2022-08-01 DIAGNOSIS — M545 Low back pain, unspecified: Secondary | ICD-10-CM

## 2022-08-01 DIAGNOSIS — M5136 Other intervertebral disc degeneration, lumbar region: Secondary | ICD-10-CM

## 2022-08-01 DIAGNOSIS — M542 Cervicalgia: Secondary | ICD-10-CM

## 2022-08-01 DIAGNOSIS — M7918 Myalgia, other site: Secondary | ICD-10-CM

## 2022-08-01 MED ORDER — GABAPENTIN 100 MG PO CAPS: ORAL_CAPSULE | ORAL | 3 refills | Status: DC

## 2022-08-01 NOTE — Telephone Encounter (Signed)
Please Review. Can you please resend this prescription for this patient please. Dr. Ky Barban send it. Per daughter prescription is incorrect

## 2022-08-01 NOTE — Telephone Encounter (Unsigned)
Copied from [place] [id number]. Topic: General - Inquiry >> Aug 01, 2022  9:37 AM Marcellus Scott wrote: Reason for CRM: Helen Ramsey called in to follow up on the medication gabapentin (NEURONTIN) 100 MG capsule. Please see TE from NT 07/31/2022.  Helen Ramsey was upset that the Rx was sent incorrect and is requesting medication be corrected and sent in today.   Helen Ramsey stated it should be gabapentin (NEURONTIN) 100 MG capsule 2 capsules at breakfast 2 capsules at lunch  90-day supply should be dispensed 360.  Please advise.

## 2022-08-04 ENCOUNTER — Other Ambulatory Visit: Payer: Self-pay

## 2022-08-04 ENCOUNTER — Other Ambulatory Visit: Payer: Self-pay | Admitting: Family Medicine

## 2022-08-04 MED ORDER — GABAPENTIN 100 MG PO CAPS: ORAL_CAPSULE | ORAL | 1 refills | Status: DC

## 2022-08-04 NOTE — Addendum Note (Signed)
Addended by: Myles Gip on: 08/04/2022 08:49 AM   Modules accepted: Orders

## 2022-08-15 DIAGNOSIS — M542 Cervicalgia: Secondary | ICD-10-CM | POA: Diagnosis not present

## 2022-08-15 DIAGNOSIS — M5481 Occipital neuralgia: Secondary | ICD-10-CM | POA: Diagnosis not present

## 2022-08-15 DIAGNOSIS — M7918 Myalgia, other site: Secondary | ICD-10-CM | POA: Diagnosis not present

## 2022-08-21 DIAGNOSIS — I509 Heart failure, unspecified: Secondary | ICD-10-CM | POA: Diagnosis not present

## 2022-08-21 DIAGNOSIS — K635 Polyp of colon: Secondary | ICD-10-CM | POA: Diagnosis not present

## 2022-08-21 DIAGNOSIS — M545 Low back pain, unspecified: Secondary | ICD-10-CM | POA: Diagnosis not present

## 2022-08-21 DIAGNOSIS — G252 Other specified forms of tremor: Secondary | ICD-10-CM | POA: Diagnosis not present

## 2022-08-21 DIAGNOSIS — E785 Hyperlipidemia, unspecified: Secondary | ICD-10-CM | POA: Diagnosis not present

## 2022-08-21 DIAGNOSIS — E669 Obesity, unspecified: Secondary | ICD-10-CM | POA: Diagnosis not present

## 2022-08-21 DIAGNOSIS — S12000D Unspecified displaced fracture of first cervical vertebra, subsequent encounter for fracture with routine healing: Secondary | ICD-10-CM | POA: Diagnosis not present

## 2022-08-21 DIAGNOSIS — G44091 Other trigeminal autonomic cephalgias (TAC), intractable: Secondary | ICD-10-CM | POA: Diagnosis not present

## 2022-08-21 DIAGNOSIS — G2581 Restless legs syndrome: Secondary | ICD-10-CM | POA: Diagnosis not present

## 2022-08-21 DIAGNOSIS — G609 Hereditary and idiopathic neuropathy, unspecified: Secondary | ICD-10-CM | POA: Diagnosis not present

## 2022-08-21 DIAGNOSIS — G3184 Mild cognitive impairment, so stated: Secondary | ICD-10-CM | POA: Diagnosis not present

## 2022-08-21 DIAGNOSIS — D649 Anemia, unspecified: Secondary | ICD-10-CM | POA: Diagnosis not present

## 2022-08-21 DIAGNOSIS — M1990 Unspecified osteoarthritis, unspecified site: Secondary | ICD-10-CM | POA: Diagnosis not present

## 2022-08-21 DIAGNOSIS — K219 Gastro-esophageal reflux disease without esophagitis: Secondary | ICD-10-CM | POA: Diagnosis not present

## 2022-08-21 DIAGNOSIS — I110 Hypertensive heart disease with heart failure: Secondary | ICD-10-CM | POA: Diagnosis not present

## 2022-08-21 DIAGNOSIS — K589 Irritable bowel syndrome without diarrhea: Secondary | ICD-10-CM | POA: Diagnosis not present

## 2022-08-21 DIAGNOSIS — H02403 Unspecified ptosis of bilateral eyelids: Secondary | ICD-10-CM | POA: Diagnosis not present

## 2022-08-21 DIAGNOSIS — G250 Essential tremor: Secondary | ICD-10-CM | POA: Diagnosis not present

## 2022-08-21 DIAGNOSIS — G4733 Obstructive sleep apnea (adult) (pediatric): Secondary | ICD-10-CM | POA: Diagnosis not present

## 2022-08-21 DIAGNOSIS — F419 Anxiety disorder, unspecified: Secondary | ICD-10-CM | POA: Diagnosis not present

## 2022-08-21 DIAGNOSIS — Z96651 Presence of right artificial knee joint: Secondary | ICD-10-CM | POA: Diagnosis not present

## 2022-08-21 DIAGNOSIS — M5481 Occipital neuralgia: Secondary | ICD-10-CM | POA: Diagnosis not present

## 2022-08-21 DIAGNOSIS — H8110 Benign paroxysmal vertigo, unspecified ear: Secondary | ICD-10-CM | POA: Diagnosis not present

## 2022-08-25 DIAGNOSIS — G3184 Mild cognitive impairment, so stated: Secondary | ICD-10-CM | POA: Diagnosis not present

## 2022-08-25 DIAGNOSIS — S12000D Unspecified displaced fracture of first cervical vertebra, subsequent encounter for fracture with routine healing: Secondary | ICD-10-CM | POA: Diagnosis not present

## 2022-08-25 DIAGNOSIS — I509 Heart failure, unspecified: Secondary | ICD-10-CM | POA: Diagnosis not present

## 2022-08-25 DIAGNOSIS — G252 Other specified forms of tremor: Secondary | ICD-10-CM | POA: Diagnosis not present

## 2022-08-25 DIAGNOSIS — G44091 Other trigeminal autonomic cephalgias (TAC), intractable: Secondary | ICD-10-CM | POA: Diagnosis not present

## 2022-08-25 DIAGNOSIS — I110 Hypertensive heart disease with heart failure: Secondary | ICD-10-CM | POA: Diagnosis not present

## 2022-08-26 DIAGNOSIS — G252 Other specified forms of tremor: Secondary | ICD-10-CM | POA: Diagnosis not present

## 2022-08-26 DIAGNOSIS — G3184 Mild cognitive impairment, so stated: Secondary | ICD-10-CM | POA: Diagnosis not present

## 2022-08-26 DIAGNOSIS — S12000D Unspecified displaced fracture of first cervical vertebra, subsequent encounter for fracture with routine healing: Secondary | ICD-10-CM | POA: Diagnosis not present

## 2022-08-26 DIAGNOSIS — I509 Heart failure, unspecified: Secondary | ICD-10-CM | POA: Diagnosis not present

## 2022-08-26 DIAGNOSIS — I110 Hypertensive heart disease with heart failure: Secondary | ICD-10-CM | POA: Diagnosis not present

## 2022-08-26 DIAGNOSIS — G44091 Other trigeminal autonomic cephalgias (TAC), intractable: Secondary | ICD-10-CM | POA: Diagnosis not present

## 2022-08-28 DIAGNOSIS — G44091 Other trigeminal autonomic cephalgias (TAC), intractable: Secondary | ICD-10-CM | POA: Diagnosis not present

## 2022-08-28 DIAGNOSIS — I509 Heart failure, unspecified: Secondary | ICD-10-CM | POA: Diagnosis not present

## 2022-08-28 DIAGNOSIS — G3184 Mild cognitive impairment, so stated: Secondary | ICD-10-CM | POA: Diagnosis not present

## 2022-08-28 DIAGNOSIS — I110 Hypertensive heart disease with heart failure: Secondary | ICD-10-CM | POA: Diagnosis not present

## 2022-08-28 DIAGNOSIS — G252 Other specified forms of tremor: Secondary | ICD-10-CM | POA: Diagnosis not present

## 2022-08-28 DIAGNOSIS — S12000D Unspecified displaced fracture of first cervical vertebra, subsequent encounter for fracture with routine healing: Secondary | ICD-10-CM | POA: Diagnosis not present

## 2022-08-29 ENCOUNTER — Other Ambulatory Visit: Payer: Medicare Other

## 2022-08-29 ENCOUNTER — Ambulatory Visit: Payer: Medicare Other | Admitting: Oncology

## 2022-09-02 DIAGNOSIS — S12000D Unspecified displaced fracture of first cervical vertebra, subsequent encounter for fracture with routine healing: Secondary | ICD-10-CM | POA: Diagnosis not present

## 2022-09-02 DIAGNOSIS — L940 Localized scleroderma [morphea]: Secondary | ICD-10-CM | POA: Diagnosis not present

## 2022-09-02 DIAGNOSIS — I509 Heart failure, unspecified: Secondary | ICD-10-CM | POA: Diagnosis not present

## 2022-09-02 DIAGNOSIS — R600 Localized edema: Secondary | ICD-10-CM | POA: Diagnosis not present

## 2022-09-02 DIAGNOSIS — G3184 Mild cognitive impairment, so stated: Secondary | ICD-10-CM | POA: Diagnosis not present

## 2022-09-02 DIAGNOSIS — I129 Hypertensive chronic kidney disease with stage 1 through stage 4 chronic kidney disease, or unspecified chronic kidney disease: Secondary | ICD-10-CM | POA: Diagnosis not present

## 2022-09-02 DIAGNOSIS — E876 Hypokalemia: Secondary | ICD-10-CM | POA: Diagnosis not present

## 2022-09-02 DIAGNOSIS — G44091 Other trigeminal autonomic cephalgias (TAC), intractable: Secondary | ICD-10-CM | POA: Diagnosis not present

## 2022-09-02 DIAGNOSIS — N184 Chronic kidney disease, stage 4 (severe): Secondary | ICD-10-CM | POA: Diagnosis not present

## 2022-09-02 DIAGNOSIS — I110 Hypertensive heart disease with heart failure: Secondary | ICD-10-CM | POA: Diagnosis not present

## 2022-09-02 DIAGNOSIS — G252 Other specified forms of tremor: Secondary | ICD-10-CM | POA: Diagnosis not present

## 2022-09-04 DIAGNOSIS — I110 Hypertensive heart disease with heart failure: Secondary | ICD-10-CM | POA: Diagnosis not present

## 2022-09-04 DIAGNOSIS — G3184 Mild cognitive impairment, so stated: Secondary | ICD-10-CM | POA: Diagnosis not present

## 2022-09-04 DIAGNOSIS — S12000D Unspecified displaced fracture of first cervical vertebra, subsequent encounter for fracture with routine healing: Secondary | ICD-10-CM | POA: Diagnosis not present

## 2022-09-04 DIAGNOSIS — I509 Heart failure, unspecified: Secondary | ICD-10-CM | POA: Diagnosis not present

## 2022-09-04 DIAGNOSIS — G44091 Other trigeminal autonomic cephalgias (TAC), intractable: Secondary | ICD-10-CM | POA: Diagnosis not present

## 2022-09-04 DIAGNOSIS — G252 Other specified forms of tremor: Secondary | ICD-10-CM | POA: Diagnosis not present

## 2022-09-08 ENCOUNTER — Ambulatory Visit: Payer: Self-pay | Admitting: *Deleted

## 2022-09-08 DIAGNOSIS — I110 Hypertensive heart disease with heart failure: Secondary | ICD-10-CM | POA: Diagnosis not present

## 2022-09-08 DIAGNOSIS — G252 Other specified forms of tremor: Secondary | ICD-10-CM | POA: Diagnosis not present

## 2022-09-08 DIAGNOSIS — G3184 Mild cognitive impairment, so stated: Secondary | ICD-10-CM | POA: Diagnosis not present

## 2022-09-08 DIAGNOSIS — G44091 Other trigeminal autonomic cephalgias (TAC), intractable: Secondary | ICD-10-CM | POA: Diagnosis not present

## 2022-09-08 DIAGNOSIS — I509 Heart failure, unspecified: Secondary | ICD-10-CM | POA: Diagnosis not present

## 2022-09-08 DIAGNOSIS — S12000D Unspecified displaced fracture of first cervical vertebra, subsequent encounter for fracture with routine healing: Secondary | ICD-10-CM | POA: Diagnosis not present

## 2022-09-08 MED ORDER — SENNOSIDES 8.6 MG PO TABS: 1.0000 | ORAL_TABLET | Freq: Every day | ORAL | 5 refills | Status: AC

## 2022-09-08 NOTE — Telephone Encounter (Signed)
Summary: Constipated, Rx request   [facility][id number] Helen Ramsey, [place]  [place] [address]  Phone: [phone] Fax: [phone]   Requesting Senna for her bowels, [name] from [facility] called to make the request. Pt is experiencing constipation.   Best contact: [phone] [name])      Pt states [facility] came today and suggested she add Senna to her meds due to gets constipated so fast. Already takes Miralax but if misses one day of having BM she gets impacted. Reminded pt that Senna is OTC but she lives in an assisted living and if it is rx it can be delivered.. [facility] [address]. Home care discussed.  Reason for Disposition  Over-The-Counter (OTC) medicines for constipation, questions about  Answer Assessment - Initial Assessment Questions 1. STOOL PATTERN OR FREQUENCY: "How often do you have a bowel movement (BM)?"  (Normal range: 3 times a day to every 3 days)  "When was your last BM?"       Gets impactions had bm yesterday, wants senna 2. STRAINING: "Do you have to strain to have a BM?"      yes 3. RECTAL PAIN: "Does your rectum hurt when the stool comes out?" If Yes, ask: "Do you have hemorrhoids? How bad is the pain?"  (Scale 1-10; or mild, moderate, severe)     yes 4. STOOL COMPOSITION: "Are the stools hard?"      Good if I go everyday, gets impaction in two days time 5. BLOOD ON STOOLS: "Has there been any blood on the toilet tissue or on the surface of the BM?" If Yes, ask: "When was the last time?"     no 6. CHRONIC CONSTIPATION: "Is this a new problem for you?"  If No, ask: "How long have you had this problem?" (days, weeks, months)      chronic 7. CHANGES IN DIET OR HYDRATION: "Have there been any recent changes in your diet?" "How much fluids are you drinking on a daily basis?"  "How much have you had to drink today?"     No change 8. MEDICINES: "Have you been taking any  new medicines?" "Are you taking any narcotic pain medicines?" (e.g., Dilaudid, morphine, Percocet, Vicodin)    Takes Miralax but wants Senna 9. LAXATIVES: "Have you been using any stool softeners, laxatives, or enemas?"  If Yes, ask "What, how often, and when was the last time?"     Takes Miralax wants Senna 10. ACTIVITY:  "How much walking do you do every day?"  "Has your activity level decreased in the past week?"        Not much 11. CAUSE: "What do you think is causing the constipation?"        Have always been like this 12. OTHER SYMPTOMS: "Do you have any other symptoms?" (e.g., abdomen pain, bloating, fever, vomiting)       no 13. MEDICAL HISTORY: "Do you have a history of hemorrhoids, rectal fissures, or rectal surgery or rectal abscess?"         hemorrhoids 14. PREGNANCY: "Is there any chance you are pregnant?" "When was your last menstrual period?"       no  Protocols used: Constipation-A-AH

## 2022-09-08 NOTE — Telephone Encounter (Signed)
Mrs. Helen Ramsey  advised.

## 2022-09-09 ENCOUNTER — Telehealth: Payer: Self-pay

## 2022-09-09 DIAGNOSIS — S12000D Unspecified displaced fracture of first cervical vertebra, subsequent encounter for fracture with routine healing: Secondary | ICD-10-CM | POA: Diagnosis not present

## 2022-09-09 DIAGNOSIS — E876 Hypokalemia: Secondary | ICD-10-CM | POA: Diagnosis not present

## 2022-09-09 DIAGNOSIS — G3184 Mild cognitive impairment, so stated: Secondary | ICD-10-CM | POA: Diagnosis not present

## 2022-09-09 DIAGNOSIS — N184 Chronic kidney disease, stage 4 (severe): Secondary | ICD-10-CM | POA: Diagnosis not present

## 2022-09-09 DIAGNOSIS — I110 Hypertensive heart disease with heart failure: Secondary | ICD-10-CM | POA: Diagnosis not present

## 2022-09-09 DIAGNOSIS — I509 Heart failure, unspecified: Secondary | ICD-10-CM | POA: Diagnosis not present

## 2022-09-09 DIAGNOSIS — I129 Hypertensive chronic kidney disease with stage 1 through stage 4 chronic kidney disease, or unspecified chronic kidney disease: Secondary | ICD-10-CM | POA: Diagnosis not present

## 2022-09-09 DIAGNOSIS — R600 Localized edema: Secondary | ICD-10-CM | POA: Diagnosis not present

## 2022-09-09 DIAGNOSIS — G44091 Other trigeminal autonomic cephalgias (TAC), intractable: Secondary | ICD-10-CM | POA: Diagnosis not present

## 2022-09-09 DIAGNOSIS — G252 Other specified forms of tremor: Secondary | ICD-10-CM | POA: Diagnosis not present

## 2022-09-09 NOTE — Telephone Encounter (Signed)
Please have patient take senna as prescribed in addition to daily miralax per her normal regimen for constipation prevention.   Eulis Foster, MD  [facility]  [phone]

## 2022-09-09 NOTE — Telephone Encounter (Signed)
Helen Ramsey wants to know if Helen Ramsey needs to take Miralax daily along with Senna that was prescribed yesterday. She reports that Helen Ramsey is taking 1 capful of miralax daily. Please advise.

## 2022-09-09 NOTE — Telephone Encounter (Signed)
Kathy advised as below.  

## 2022-09-11 DIAGNOSIS — G252 Other specified forms of tremor: Secondary | ICD-10-CM | POA: Diagnosis not present

## 2022-09-11 DIAGNOSIS — G3184 Mild cognitive impairment, so stated: Secondary | ICD-10-CM | POA: Diagnosis not present

## 2022-09-11 DIAGNOSIS — I110 Hypertensive heart disease with heart failure: Secondary | ICD-10-CM | POA: Diagnosis not present

## 2022-09-11 DIAGNOSIS — I509 Heart failure, unspecified: Secondary | ICD-10-CM | POA: Diagnosis not present

## 2022-09-11 DIAGNOSIS — S12000D Unspecified displaced fracture of first cervical vertebra, subsequent encounter for fracture with routine healing: Secondary | ICD-10-CM | POA: Diagnosis not present

## 2022-09-11 DIAGNOSIS — G44091 Other trigeminal autonomic cephalgias (TAC), intractable: Secondary | ICD-10-CM | POA: Diagnosis not present

## 2022-09-12 ENCOUNTER — Ambulatory Visit: Payer: Self-pay

## 2022-09-15 DIAGNOSIS — G252 Other specified forms of tremor: Secondary | ICD-10-CM | POA: Diagnosis not present

## 2022-09-15 DIAGNOSIS — G44091 Other trigeminal autonomic cephalgias (TAC), intractable: Secondary | ICD-10-CM | POA: Diagnosis not present

## 2022-09-15 DIAGNOSIS — I509 Heart failure, unspecified: Secondary | ICD-10-CM | POA: Diagnosis not present

## 2022-09-15 DIAGNOSIS — G3184 Mild cognitive impairment, so stated: Secondary | ICD-10-CM | POA: Diagnosis not present

## 2022-09-15 DIAGNOSIS — S12000D Unspecified displaced fracture of first cervical vertebra, subsequent encounter for fracture with routine healing: Secondary | ICD-10-CM | POA: Diagnosis not present

## 2022-09-15 DIAGNOSIS — I110 Hypertensive heart disease with heart failure: Secondary | ICD-10-CM | POA: Diagnosis not present

## 2022-09-18 DIAGNOSIS — I6350 Cerebral infarction due to unspecified occlusion or stenosis of unspecified cerebral artery: Secondary | ICD-10-CM | POA: Diagnosis not present

## 2022-09-18 DIAGNOSIS — F411 Generalized anxiety disorder: Secondary | ICD-10-CM | POA: Diagnosis not present

## 2022-09-18 DIAGNOSIS — R0602 Shortness of breath: Secondary | ICD-10-CM | POA: Diagnosis not present

## 2022-09-18 DIAGNOSIS — G2581 Restless legs syndrome: Secondary | ICD-10-CM | POA: Diagnosis not present

## 2022-09-18 DIAGNOSIS — E785 Hyperlipidemia, unspecified: Secondary | ICD-10-CM | POA: Diagnosis not present

## 2022-09-18 DIAGNOSIS — I509 Heart failure, unspecified: Secondary | ICD-10-CM | POA: Diagnosis not present

## 2022-09-18 DIAGNOSIS — G4733 Obstructive sleep apnea (adult) (pediatric): Secondary | ICD-10-CM | POA: Diagnosis not present

## 2022-09-18 DIAGNOSIS — I10 Essential (primary) hypertension: Secondary | ICD-10-CM | POA: Diagnosis not present

## 2022-09-18 DIAGNOSIS — R011 Cardiac murmur, unspecified: Secondary | ICD-10-CM | POA: Diagnosis not present

## 2022-09-20 DIAGNOSIS — G44091 Other trigeminal autonomic cephalgias (TAC), intractable: Secondary | ICD-10-CM | POA: Diagnosis not present

## 2022-09-20 DIAGNOSIS — E669 Obesity, unspecified: Secondary | ICD-10-CM | POA: Diagnosis not present

## 2022-09-20 DIAGNOSIS — M545 Low back pain, unspecified: Secondary | ICD-10-CM | POA: Diagnosis not present

## 2022-09-20 DIAGNOSIS — K219 Gastro-esophageal reflux disease without esophagitis: Secondary | ICD-10-CM | POA: Diagnosis not present

## 2022-09-20 DIAGNOSIS — H02403 Unspecified ptosis of bilateral eyelids: Secondary | ICD-10-CM | POA: Diagnosis not present

## 2022-09-20 DIAGNOSIS — K589 Irritable bowel syndrome without diarrhea: Secondary | ICD-10-CM | POA: Diagnosis not present

## 2022-09-20 DIAGNOSIS — S12000D Unspecified displaced fracture of first cervical vertebra, subsequent encounter for fracture with routine healing: Secondary | ICD-10-CM | POA: Diagnosis not present

## 2022-09-20 DIAGNOSIS — G4733 Obstructive sleep apnea (adult) (pediatric): Secondary | ICD-10-CM | POA: Diagnosis not present

## 2022-09-20 DIAGNOSIS — K635 Polyp of colon: Secondary | ICD-10-CM | POA: Diagnosis not present

## 2022-09-20 DIAGNOSIS — G252 Other specified forms of tremor: Secondary | ICD-10-CM | POA: Diagnosis not present

## 2022-09-20 DIAGNOSIS — G250 Essential tremor: Secondary | ICD-10-CM | POA: Diagnosis not present

## 2022-09-20 DIAGNOSIS — M1990 Unspecified osteoarthritis, unspecified site: Secondary | ICD-10-CM | POA: Diagnosis not present

## 2022-09-20 DIAGNOSIS — D649 Anemia, unspecified: Secondary | ICD-10-CM | POA: Diagnosis not present

## 2022-09-20 DIAGNOSIS — G3184 Mild cognitive impairment, so stated: Secondary | ICD-10-CM | POA: Diagnosis not present

## 2022-09-20 DIAGNOSIS — H8110 Benign paroxysmal vertigo, unspecified ear: Secondary | ICD-10-CM | POA: Diagnosis not present

## 2022-09-20 DIAGNOSIS — G2581 Restless legs syndrome: Secondary | ICD-10-CM | POA: Diagnosis not present

## 2022-09-20 DIAGNOSIS — E785 Hyperlipidemia, unspecified: Secondary | ICD-10-CM | POA: Diagnosis not present

## 2022-09-20 DIAGNOSIS — M5481 Occipital neuralgia: Secondary | ICD-10-CM | POA: Diagnosis not present

## 2022-09-20 DIAGNOSIS — Z96651 Presence of right artificial knee joint: Secondary | ICD-10-CM | POA: Diagnosis not present

## 2022-09-20 DIAGNOSIS — I509 Heart failure, unspecified: Secondary | ICD-10-CM | POA: Diagnosis not present

## 2022-09-20 DIAGNOSIS — G609 Hereditary and idiopathic neuropathy, unspecified: Secondary | ICD-10-CM | POA: Diagnosis not present

## 2022-09-20 DIAGNOSIS — I110 Hypertensive heart disease with heart failure: Secondary | ICD-10-CM | POA: Diagnosis not present

## 2022-09-20 DIAGNOSIS — F419 Anxiety disorder, unspecified: Secondary | ICD-10-CM | POA: Diagnosis not present

## 2022-09-22 DIAGNOSIS — S12000D Unspecified displaced fracture of first cervical vertebra, subsequent encounter for fracture with routine healing: Secondary | ICD-10-CM | POA: Diagnosis not present

## 2022-09-22 DIAGNOSIS — G3184 Mild cognitive impairment, so stated: Secondary | ICD-10-CM | POA: Diagnosis not present

## 2022-09-22 DIAGNOSIS — G252 Other specified forms of tremor: Secondary | ICD-10-CM | POA: Diagnosis not present

## 2022-09-22 DIAGNOSIS — G44091 Other trigeminal autonomic cephalgias (TAC), intractable: Secondary | ICD-10-CM | POA: Diagnosis not present

## 2022-09-22 DIAGNOSIS — I509 Heart failure, unspecified: Secondary | ICD-10-CM | POA: Diagnosis not present

## 2022-09-22 DIAGNOSIS — I110 Hypertensive heart disease with heart failure: Secondary | ICD-10-CM | POA: Diagnosis not present

## 2022-09-23 DIAGNOSIS — Z79899 Other long term (current) drug therapy: Secondary | ICD-10-CM | POA: Diagnosis not present

## 2022-09-23 DIAGNOSIS — M5416 Radiculopathy, lumbar region: Secondary | ICD-10-CM | POA: Diagnosis not present

## 2022-09-23 DIAGNOSIS — M5136 Other intervertebral disc degeneration, lumbar region: Secondary | ICD-10-CM | POA: Diagnosis not present

## 2022-09-23 DIAGNOSIS — M48062 Spinal stenosis, lumbar region with neurogenic claudication: Secondary | ICD-10-CM | POA: Diagnosis not present

## 2022-09-24 DIAGNOSIS — I509 Heart failure, unspecified: Secondary | ICD-10-CM | POA: Diagnosis not present

## 2022-09-24 DIAGNOSIS — G252 Other specified forms of tremor: Secondary | ICD-10-CM | POA: Diagnosis not present

## 2022-09-24 DIAGNOSIS — I110 Hypertensive heart disease with heart failure: Secondary | ICD-10-CM | POA: Diagnosis not present

## 2022-09-24 DIAGNOSIS — G3184 Mild cognitive impairment, so stated: Secondary | ICD-10-CM | POA: Diagnosis not present

## 2022-09-24 DIAGNOSIS — S12000D Unspecified displaced fracture of first cervical vertebra, subsequent encounter for fracture with routine healing: Secondary | ICD-10-CM | POA: Diagnosis not present

## 2022-09-24 DIAGNOSIS — G44091 Other trigeminal autonomic cephalgias (TAC), intractable: Secondary | ICD-10-CM | POA: Diagnosis not present

## 2022-09-25 DIAGNOSIS — I509 Heart failure, unspecified: Secondary | ICD-10-CM | POA: Diagnosis not present

## 2022-09-25 DIAGNOSIS — I110 Hypertensive heart disease with heart failure: Secondary | ICD-10-CM | POA: Diagnosis not present

## 2022-09-25 DIAGNOSIS — G44091 Other trigeminal autonomic cephalgias (TAC), intractable: Secondary | ICD-10-CM | POA: Diagnosis not present

## 2022-09-25 DIAGNOSIS — G3184 Mild cognitive impairment, so stated: Secondary | ICD-10-CM | POA: Diagnosis not present

## 2022-09-25 DIAGNOSIS — G252 Other specified forms of tremor: Secondary | ICD-10-CM | POA: Diagnosis not present

## 2022-09-25 DIAGNOSIS — S12000D Unspecified displaced fracture of first cervical vertebra, subsequent encounter for fracture with routine healing: Secondary | ICD-10-CM | POA: Diagnosis not present

## 2022-09-26 ENCOUNTER — Telehealth: Payer: Self-pay | Admitting: Family Medicine

## 2022-09-26 DIAGNOSIS — K219 Gastro-esophageal reflux disease without esophagitis: Secondary | ICD-10-CM

## 2022-09-26 MED ORDER — OMEPRAZOLE 20 MG PO CPDR: DELAYED_RELEASE_CAPSULE | ORAL | 1 refills | Status: DC

## 2022-09-26 NOTE — Telephone Encounter (Signed)
[facility] faxed refill request for the following medications:   omeprazole (PRILOSEC) 20 MG capsule   Please advise

## 2022-09-29 ENCOUNTER — Ambulatory Visit: Payer: Self-pay

## 2022-09-29 DIAGNOSIS — G44091 Other trigeminal autonomic cephalgias (TAC), intractable: Secondary | ICD-10-CM | POA: Diagnosis not present

## 2022-09-29 DIAGNOSIS — G3184 Mild cognitive impairment, so stated: Secondary | ICD-10-CM | POA: Diagnosis not present

## 2022-09-29 DIAGNOSIS — I110 Hypertensive heart disease with heart failure: Secondary | ICD-10-CM | POA: Diagnosis not present

## 2022-09-29 DIAGNOSIS — G252 Other specified forms of tremor: Secondary | ICD-10-CM | POA: Diagnosis not present

## 2022-09-29 DIAGNOSIS — S12000D Unspecified displaced fracture of first cervical vertebra, subsequent encounter for fracture with routine healing: Secondary | ICD-10-CM | POA: Diagnosis not present

## 2022-09-29 DIAGNOSIS — I509 Heart failure, unspecified: Secondary | ICD-10-CM | POA: Diagnosis not present

## 2022-09-29 NOTE — Telephone Encounter (Signed)
[name] reports patient is using Miralax one capful daily and Senna 3 tablets daily. She reports she is limited to 48 oz. Of liquid by cardio. She reports patient did have a bowel movement yesterday. Please advise.

## 2022-09-29 NOTE — Telephone Encounter (Signed)
  Chief Complaint: constipation Symptoms: feels like stool is impacted Frequency: chronic Pertinent Negatives: Patient denies abd pain, diarrhea, blood from rectum, feels impacted Disposition: '[]'$ ED /'[]'$ Urgent Care (no appt availability in office) / '[]'$ Appointment(In office/virtual)/ '[]'$  [facility] [facility]/ '[]'$ Home Care/ '[]'$ Refused Recommended Disposition /'[]'$ [place] [facility]/ '[x]'$  Follow-up with PCP Additional Notes: [name] home health nurse called about pt's constipation and asking if pt can be prescribed Lactulose or Linzess. Please advise Reason for Disposition  Constipation is a chronic symptom (recurrent or ongoing AND present > 4 weeks)  Answer Assessment - Initial Assessment Questions 1. STOOL PATTERN OR FREQUENCY: "How often do you have a bowel movement (BM)?"  (Normal range: 3 times a day to every 3 days)  "When was your last BM?"       Irregular- has small BM "hard balls" 2. STRAINING: "Do you have to strain to have a BM?"      yes 3. RECTAL PAIN: "Does your rectum hurt when the stool comes out?" If Yes, ask: "Do you have hemorrhoids? How bad is the pain?"  (Scale 1-10; or mild, moderate, severe)     Yes-yes 4. STOOL COMPOSITION: "Are the stools hard?"      yes 5. BLOOD ON STOOLS: "Has there been any blood on the toilet tissue or on the surface of the BM?" If Yes, ask: "When was the last time?"     no 6. CHRONIC CONSTIPATION: "Is this a new problem for you?"  If No, ask: "How long have you had this problem?" (days, weeks, months)      No h/o impaction 7. CHANGES IN DIET OR HYDRATION: "Have there been any recent changes in your diet?" "How much fluids are you drinking on a daily basis?"  "How much have you had to drink today?"     no 8. MEDICINES: "Have you been taking any new medicines?" "Are you taking any narcotic pain medicines?" (e.g., Dilaudid, morphine, Percocet, Vicodin)     No- 9. LAXATIVES: "Have you been using any stool softeners, laxatives, or enemas?"  If Yes, ask  "What, how often, and when was the last time?"     yes 10. ACTIVITY:  "How much walking do you do every day?"  "Has your activity level decreased in the past week?"        N/a 11. CAUSE: "What do you think is causing the constipation?"        medications 12. OTHER SYMPTOMS: "Do you have any other symptoms?" (e.g., abdomen pain, bloating, fever, vomiting)       Impaction x 2 in past  13. MEDICAL HISTORY: "Do you have a history of hemorrhoids, rectal fissures, or rectal surgery or rectal abscess?"         hemorrhoids 14. PREGNANCY: "Is there any chance you are pregnant?" "When was your last menstrual period?"       N/a  Protocols used: Constipation-A-AH

## 2022-09-29 NOTE — Telephone Encounter (Signed)
Is she using Senna or miralax or anything? Should not use lactulose or linzess as a first line agent for constipation

## 2022-09-30 ENCOUNTER — Ambulatory Visit: Payer: Self-pay | Admitting: *Deleted

## 2022-09-30 MED ORDER — LINACLOTIDE 72 MCG PO CAPS: 72.0000 ug | ORAL_CAPSULE | Freq: Every day | ORAL | 1 refills | Status: DC

## 2022-09-30 NOTE — Telephone Encounter (Signed)
Helen Ramsey advised as below.

## 2022-09-30 NOTE — Addendum Note (Signed)
Addended by: Virginia Crews on: 09/30/2022 08:03 AM   Modules accepted: Orders

## 2022-09-30 NOTE — Telephone Encounter (Signed)
[name] returned the call.   I gave her the message from Dr. Brita Romp dated 09/30/2022 8:03 AM.  Pt did have a m this morning.  They will pick up the Linzess from the pharmacy.    [name] explained that BessieMae's memory is not very good so not sure when she is truly constipated or not.   Pt will say she had a BM then a few minutes later say she is constipated and not had a BM today.     Reason for Disposition  [1] Follow-up call to recent contact AND [2] information only call, no triage required  Answer Assessment - Initial Assessment Questions 1. REASON FOR CALL or QUESTION: "What is your reason for calling today?" or "How can I best help you?" or "What question do you have that I can help answer?"     Message from Dr. Brita Romp read to [facility] regarding the [place] and backing off on the senna.  Protocols used: Information Only Call - No Triage-A-AH

## 2022-10-02 DIAGNOSIS — I509 Heart failure, unspecified: Secondary | ICD-10-CM | POA: Diagnosis not present

## 2022-10-02 DIAGNOSIS — G3184 Mild cognitive impairment, so stated: Secondary | ICD-10-CM | POA: Diagnosis not present

## 2022-10-02 DIAGNOSIS — G44091 Other trigeminal autonomic cephalgias (TAC), intractable: Secondary | ICD-10-CM | POA: Diagnosis not present

## 2022-10-02 DIAGNOSIS — I129 Hypertensive chronic kidney disease with stage 1 through stage 4 chronic kidney disease, or unspecified chronic kidney disease: Secondary | ICD-10-CM | POA: Diagnosis not present

## 2022-10-02 DIAGNOSIS — G252 Other specified forms of tremor: Secondary | ICD-10-CM | POA: Diagnosis not present

## 2022-10-02 DIAGNOSIS — R700 Elevated erythrocyte sedimentation rate: Secondary | ICD-10-CM | POA: Diagnosis not present

## 2022-10-02 DIAGNOSIS — N184 Chronic kidney disease, stage 4 (severe): Secondary | ICD-10-CM | POA: Diagnosis not present

## 2022-10-02 DIAGNOSIS — E876 Hypokalemia: Secondary | ICD-10-CM | POA: Diagnosis not present

## 2022-10-02 DIAGNOSIS — R600 Localized edema: Secondary | ICD-10-CM | POA: Diagnosis not present

## 2022-10-02 DIAGNOSIS — I110 Hypertensive heart disease with heart failure: Secondary | ICD-10-CM | POA: Diagnosis not present

## 2022-10-02 DIAGNOSIS — S12000D Unspecified displaced fracture of first cervical vertebra, subsequent encounter for fracture with routine healing: Secondary | ICD-10-CM | POA: Diagnosis not present

## 2022-10-03 DIAGNOSIS — I110 Hypertensive heart disease with heart failure: Secondary | ICD-10-CM | POA: Diagnosis not present

## 2022-10-03 DIAGNOSIS — G44091 Other trigeminal autonomic cephalgias (TAC), intractable: Secondary | ICD-10-CM | POA: Diagnosis not present

## 2022-10-03 DIAGNOSIS — G252 Other specified forms of tremor: Secondary | ICD-10-CM | POA: Diagnosis not present

## 2022-10-03 DIAGNOSIS — S12000D Unspecified displaced fracture of first cervical vertebra, subsequent encounter for fracture with routine healing: Secondary | ICD-10-CM | POA: Diagnosis not present

## 2022-10-03 DIAGNOSIS — I509 Heart failure, unspecified: Secondary | ICD-10-CM | POA: Diagnosis not present

## 2022-10-03 DIAGNOSIS — G3184 Mild cognitive impairment, so stated: Secondary | ICD-10-CM | POA: Diagnosis not present

## 2022-10-06 DIAGNOSIS — G44091 Other trigeminal autonomic cephalgias (TAC), intractable: Secondary | ICD-10-CM | POA: Diagnosis not present

## 2022-10-06 DIAGNOSIS — G252 Other specified forms of tremor: Secondary | ICD-10-CM | POA: Diagnosis not present

## 2022-10-06 DIAGNOSIS — S12000D Unspecified displaced fracture of first cervical vertebra, subsequent encounter for fracture with routine healing: Secondary | ICD-10-CM | POA: Diagnosis not present

## 2022-10-06 DIAGNOSIS — G3184 Mild cognitive impairment, so stated: Secondary | ICD-10-CM | POA: Diagnosis not present

## 2022-10-06 DIAGNOSIS — I110 Hypertensive heart disease with heart failure: Secondary | ICD-10-CM | POA: Diagnosis not present

## 2022-10-06 DIAGNOSIS — I509 Heart failure, unspecified: Secondary | ICD-10-CM | POA: Diagnosis not present

## 2022-10-07 DIAGNOSIS — G44091 Other trigeminal autonomic cephalgias (TAC), intractable: Secondary | ICD-10-CM | POA: Diagnosis not present

## 2022-10-07 DIAGNOSIS — I110 Hypertensive heart disease with heart failure: Secondary | ICD-10-CM | POA: Diagnosis not present

## 2022-10-07 DIAGNOSIS — I509 Heart failure, unspecified: Secondary | ICD-10-CM | POA: Diagnosis not present

## 2022-10-07 DIAGNOSIS — G252 Other specified forms of tremor: Secondary | ICD-10-CM | POA: Diagnosis not present

## 2022-10-07 DIAGNOSIS — G3184 Mild cognitive impairment, so stated: Secondary | ICD-10-CM | POA: Diagnosis not present

## 2022-10-07 DIAGNOSIS — S12000D Unspecified displaced fracture of first cervical vertebra, subsequent encounter for fracture with routine healing: Secondary | ICD-10-CM | POA: Diagnosis not present

## 2022-10-08 DIAGNOSIS — R768 Other specified abnormal immunological findings in serum: Secondary | ICD-10-CM | POA: Diagnosis not present

## 2022-10-08 DIAGNOSIS — L940 Localized scleroderma [morphea]: Secondary | ICD-10-CM | POA: Diagnosis not present

## 2022-10-08 DIAGNOSIS — G44091 Other trigeminal autonomic cephalgias (TAC), intractable: Secondary | ICD-10-CM | POA: Diagnosis not present

## 2022-10-08 DIAGNOSIS — G252 Other specified forms of tremor: Secondary | ICD-10-CM | POA: Diagnosis not present

## 2022-10-08 DIAGNOSIS — G3184 Mild cognitive impairment, so stated: Secondary | ICD-10-CM | POA: Diagnosis not present

## 2022-10-08 DIAGNOSIS — I509 Heart failure, unspecified: Secondary | ICD-10-CM | POA: Diagnosis not present

## 2022-10-08 DIAGNOSIS — Z79899 Other long term (current) drug therapy: Secondary | ICD-10-CM | POA: Diagnosis not present

## 2022-10-08 DIAGNOSIS — S12000D Unspecified displaced fracture of first cervical vertebra, subsequent encounter for fracture with routine healing: Secondary | ICD-10-CM | POA: Diagnosis not present

## 2022-10-08 DIAGNOSIS — I110 Hypertensive heart disease with heart failure: Secondary | ICD-10-CM | POA: Diagnosis not present

## 2022-10-15 DIAGNOSIS — I110 Hypertensive heart disease with heart failure: Secondary | ICD-10-CM | POA: Diagnosis not present

## 2022-10-15 DIAGNOSIS — G252 Other specified forms of tremor: Secondary | ICD-10-CM | POA: Diagnosis not present

## 2022-10-15 DIAGNOSIS — S12000D Unspecified displaced fracture of first cervical vertebra, subsequent encounter for fracture with routine healing: Secondary | ICD-10-CM | POA: Diagnosis not present

## 2022-10-15 DIAGNOSIS — I509 Heart failure, unspecified: Secondary | ICD-10-CM | POA: Diagnosis not present

## 2022-10-15 DIAGNOSIS — G44091 Other trigeminal autonomic cephalgias (TAC), intractable: Secondary | ICD-10-CM | POA: Diagnosis not present

## 2022-10-15 DIAGNOSIS — G3184 Mild cognitive impairment, so stated: Secondary | ICD-10-CM | POA: Diagnosis not present

## 2022-10-16 DIAGNOSIS — G44091 Other trigeminal autonomic cephalgias (TAC), intractable: Secondary | ICD-10-CM | POA: Diagnosis not present

## 2022-10-16 DIAGNOSIS — G252 Other specified forms of tremor: Secondary | ICD-10-CM | POA: Diagnosis not present

## 2022-10-16 DIAGNOSIS — S12000D Unspecified displaced fracture of first cervical vertebra, subsequent encounter for fracture with routine healing: Secondary | ICD-10-CM | POA: Diagnosis not present

## 2022-10-16 DIAGNOSIS — I509 Heart failure, unspecified: Secondary | ICD-10-CM | POA: Diagnosis not present

## 2022-10-16 DIAGNOSIS — G3184 Mild cognitive impairment, so stated: Secondary | ICD-10-CM | POA: Diagnosis not present

## 2022-10-16 DIAGNOSIS — I110 Hypertensive heart disease with heart failure: Secondary | ICD-10-CM | POA: Diagnosis not present

## 2022-10-20 DIAGNOSIS — G252 Other specified forms of tremor: Secondary | ICD-10-CM | POA: Diagnosis not present

## 2022-10-20 DIAGNOSIS — I509 Heart failure, unspecified: Secondary | ICD-10-CM | POA: Diagnosis not present

## 2022-10-20 DIAGNOSIS — H8110 Benign paroxysmal vertigo, unspecified ear: Secondary | ICD-10-CM | POA: Diagnosis not present

## 2022-10-20 DIAGNOSIS — D649 Anemia, unspecified: Secondary | ICD-10-CM | POA: Diagnosis not present

## 2022-10-20 DIAGNOSIS — K589 Irritable bowel syndrome without diarrhea: Secondary | ICD-10-CM | POA: Diagnosis not present

## 2022-10-20 DIAGNOSIS — I69811 Memory deficit following other cerebrovascular disease: Secondary | ICD-10-CM | POA: Diagnosis not present

## 2022-10-20 DIAGNOSIS — I69820 Aphasia following other cerebrovascular disease: Secondary | ICD-10-CM | POA: Diagnosis not present

## 2022-10-20 DIAGNOSIS — G2581 Restless legs syndrome: Secondary | ICD-10-CM | POA: Diagnosis not present

## 2022-10-20 DIAGNOSIS — I110 Hypertensive heart disease with heart failure: Secondary | ICD-10-CM | POA: Diagnosis not present

## 2022-10-20 DIAGNOSIS — G250 Essential tremor: Secondary | ICD-10-CM | POA: Diagnosis not present

## 2022-10-20 DIAGNOSIS — S12000D Unspecified displaced fracture of first cervical vertebra, subsequent encounter for fracture with routine healing: Secondary | ICD-10-CM | POA: Diagnosis not present

## 2022-10-20 DIAGNOSIS — M545 Low back pain, unspecified: Secondary | ICD-10-CM | POA: Diagnosis not present

## 2022-10-20 DIAGNOSIS — M5481 Occipital neuralgia: Secondary | ICD-10-CM | POA: Diagnosis not present

## 2022-10-20 DIAGNOSIS — G4733 Obstructive sleep apnea (adult) (pediatric): Secondary | ICD-10-CM | POA: Diagnosis not present

## 2022-10-20 DIAGNOSIS — H02403 Unspecified ptosis of bilateral eyelids: Secondary | ICD-10-CM | POA: Diagnosis not present

## 2022-10-20 DIAGNOSIS — K219 Gastro-esophageal reflux disease without esophagitis: Secondary | ICD-10-CM | POA: Diagnosis not present

## 2022-10-20 DIAGNOSIS — F419 Anxiety disorder, unspecified: Secondary | ICD-10-CM | POA: Diagnosis not present

## 2022-10-20 DIAGNOSIS — G44091 Other trigeminal autonomic cephalgias (TAC), intractable: Secondary | ICD-10-CM | POA: Diagnosis not present

## 2022-10-20 DIAGNOSIS — K635 Polyp of colon: Secondary | ICD-10-CM | POA: Diagnosis not present

## 2022-10-20 DIAGNOSIS — M1990 Unspecified osteoarthritis, unspecified site: Secondary | ICD-10-CM | POA: Diagnosis not present

## 2022-10-20 DIAGNOSIS — E785 Hyperlipidemia, unspecified: Secondary | ICD-10-CM | POA: Diagnosis not present

## 2022-10-20 DIAGNOSIS — E669 Obesity, unspecified: Secondary | ICD-10-CM | POA: Diagnosis not present

## 2022-10-20 DIAGNOSIS — Z96651 Presence of right artificial knee joint: Secondary | ICD-10-CM | POA: Diagnosis not present

## 2022-10-20 DIAGNOSIS — G3184 Mild cognitive impairment, so stated: Secondary | ICD-10-CM | POA: Diagnosis not present

## 2022-10-20 DIAGNOSIS — G609 Hereditary and idiopathic neuropathy, unspecified: Secondary | ICD-10-CM | POA: Diagnosis not present

## 2022-10-22 DIAGNOSIS — G252 Other specified forms of tremor: Secondary | ICD-10-CM | POA: Diagnosis not present

## 2022-10-22 DIAGNOSIS — I69811 Memory deficit following other cerebrovascular disease: Secondary | ICD-10-CM | POA: Diagnosis not present

## 2022-10-22 DIAGNOSIS — G44091 Other trigeminal autonomic cephalgias (TAC), intractable: Secondary | ICD-10-CM | POA: Diagnosis not present

## 2022-10-22 DIAGNOSIS — S12000D Unspecified displaced fracture of first cervical vertebra, subsequent encounter for fracture with routine healing: Secondary | ICD-10-CM | POA: Diagnosis not present

## 2022-10-22 DIAGNOSIS — I69820 Aphasia following other cerebrovascular disease: Secondary | ICD-10-CM | POA: Diagnosis not present

## 2022-10-22 DIAGNOSIS — G3184 Mild cognitive impairment, so stated: Secondary | ICD-10-CM | POA: Diagnosis not present

## 2022-10-30 DIAGNOSIS — E876 Hypokalemia: Secondary | ICD-10-CM | POA: Diagnosis not present

## 2022-10-30 DIAGNOSIS — G44091 Other trigeminal autonomic cephalgias (TAC), intractable: Secondary | ICD-10-CM | POA: Diagnosis not present

## 2022-10-30 DIAGNOSIS — I69820 Aphasia following other cerebrovascular disease: Secondary | ICD-10-CM | POA: Diagnosis not present

## 2022-10-30 DIAGNOSIS — S12000D Unspecified displaced fracture of first cervical vertebra, subsequent encounter for fracture with routine healing: Secondary | ICD-10-CM | POA: Diagnosis not present

## 2022-10-30 DIAGNOSIS — I69811 Memory deficit following other cerebrovascular disease: Secondary | ICD-10-CM | POA: Diagnosis not present

## 2022-10-30 DIAGNOSIS — N184 Chronic kidney disease, stage 4 (severe): Secondary | ICD-10-CM | POA: Diagnosis not present

## 2022-10-30 DIAGNOSIS — G3184 Mild cognitive impairment, so stated: Secondary | ICD-10-CM | POA: Diagnosis not present

## 2022-10-30 DIAGNOSIS — G252 Other specified forms of tremor: Secondary | ICD-10-CM | POA: Diagnosis not present

## 2022-10-30 DIAGNOSIS — R600 Localized edema: Secondary | ICD-10-CM | POA: Diagnosis not present

## 2022-10-30 DIAGNOSIS — D631 Anemia in chronic kidney disease: Secondary | ICD-10-CM | POA: Diagnosis not present

## 2022-10-30 DIAGNOSIS — I129 Hypertensive chronic kidney disease with stage 1 through stage 4 chronic kidney disease, or unspecified chronic kidney disease: Secondary | ICD-10-CM | POA: Diagnosis not present

## 2022-10-31 ENCOUNTER — Other Ambulatory Visit: Payer: Self-pay | Admitting: Family Medicine

## 2022-10-31 MED ORDER — GABAPENTIN 100 MG PO CAPS: ORAL_CAPSULE | ORAL | 2 refills | Status: DC

## 2022-10-31 NOTE — Telephone Encounter (Signed)
Requested Prescriptions  Pending Prescriptions Disp Refills   gabapentin (NEURONTIN) 100 MG capsule 120 capsule 2    Sig: Patient to take 1 in the morning, 1 in the afternoon and 2 at bedtime.     Neurology: Anticonvulsants - gabapentin Failed - 10/31/2022  8:34 AM      Failed - Cr in normal range and within 360 days    Creatinine  Date Value Ref Range Status  06/21/2014 0.83 0.60 - 1.30 mg/dL Final   Creatinine, Ser  Date Value Ref Range Status  06/04/2022 1.46 (H) 0.57 - 1.00 mg/dL Final         Passed - Completed PHQ-2 or PHQ-9 in the last 360 days      Passed - Valid encounter within last 12 months    Recent Outpatient Visits           4 months ago Chronic heart failure with preserved ejection fraction [facility])   [facility] Tally Joe T, FNP   6 months ago Bilateral calf pain   [facility] [place], [name], MD   8 months ago Acute on chronic heart failure with preserved ejection fraction [facility])   [facility], [name], MD   8 months ago Pressure injury of left buttock, stage 2 [facility])   [facility], [name], MD   10 months ago Essential (primary) hypertension   [facility], [name], MD       Future Appointments             In 2 weeks Bacigalupo, [name], MD [facility], [place]

## 2022-10-31 NOTE — Telephone Encounter (Signed)
Medication Refill - Medication: gabapentin (NEURONTIN) 100 MG capsule [[id number]]   Pt will run out of medication this evening  Has the patient contacted their pharmacy? Yes.   (Agent: If no, request that the patient contact the pharmacy for the refill. If patient does not wish to contact the pharmacy document the reason why and proceed with request.) (Agent: If yes, when and what did the pharmacy advise?)  Preferred Pharmacy (with phone number or street name): [id number] Lorina Rabon, [place] - [address], [place] [address] Phone: [phone]  Fax: [phone]   Has the patient been seen for an appointment in the last year OR does the patient have an upcoming appointment? Yes.    Agent: Please be advised that RX refills may take up to 3 business days. We ask that you follow-up with your pharmacy.

## 2022-11-01 DIAGNOSIS — G44091 Other trigeminal autonomic cephalgias (TAC), intractable: Secondary | ICD-10-CM | POA: Diagnosis not present

## 2022-11-01 DIAGNOSIS — I69811 Memory deficit following other cerebrovascular disease: Secondary | ICD-10-CM | POA: Diagnosis not present

## 2022-11-01 DIAGNOSIS — I69820 Aphasia following other cerebrovascular disease: Secondary | ICD-10-CM | POA: Diagnosis not present

## 2022-11-01 DIAGNOSIS — G252 Other specified forms of tremor: Secondary | ICD-10-CM | POA: Diagnosis not present

## 2022-11-01 DIAGNOSIS — S12000D Unspecified displaced fracture of first cervical vertebra, subsequent encounter for fracture with routine healing: Secondary | ICD-10-CM | POA: Diagnosis not present

## 2022-11-01 DIAGNOSIS — G3184 Mild cognitive impairment, so stated: Secondary | ICD-10-CM | POA: Diagnosis not present

## 2022-11-05 DIAGNOSIS — S12000D Unspecified displaced fracture of first cervical vertebra, subsequent encounter for fracture with routine healing: Secondary | ICD-10-CM | POA: Diagnosis not present

## 2022-11-05 DIAGNOSIS — G3184 Mild cognitive impairment, so stated: Secondary | ICD-10-CM | POA: Diagnosis not present

## 2022-11-05 DIAGNOSIS — I69811 Memory deficit following other cerebrovascular disease: Secondary | ICD-10-CM | POA: Diagnosis not present

## 2022-11-05 DIAGNOSIS — G252 Other specified forms of tremor: Secondary | ICD-10-CM | POA: Diagnosis not present

## 2022-11-05 DIAGNOSIS — G44091 Other trigeminal autonomic cephalgias (TAC), intractable: Secondary | ICD-10-CM | POA: Diagnosis not present

## 2022-11-05 DIAGNOSIS — I69820 Aphasia following other cerebrovascular disease: Secondary | ICD-10-CM | POA: Diagnosis not present

## 2022-11-14 DIAGNOSIS — I69811 Memory deficit following other cerebrovascular disease: Secondary | ICD-10-CM | POA: Diagnosis not present

## 2022-11-14 DIAGNOSIS — G3184 Mild cognitive impairment, so stated: Secondary | ICD-10-CM | POA: Diagnosis not present

## 2022-11-14 DIAGNOSIS — S12000D Unspecified displaced fracture of first cervical vertebra, subsequent encounter for fracture with routine healing: Secondary | ICD-10-CM | POA: Diagnosis not present

## 2022-11-14 DIAGNOSIS — G44091 Other trigeminal autonomic cephalgias (TAC), intractable: Secondary | ICD-10-CM | POA: Diagnosis not present

## 2022-11-14 DIAGNOSIS — I69820 Aphasia following other cerebrovascular disease: Secondary | ICD-10-CM | POA: Diagnosis not present

## 2022-11-14 DIAGNOSIS — G252 Other specified forms of tremor: Secondary | ICD-10-CM | POA: Diagnosis not present

## 2022-11-14 NOTE — Progress Notes (Unsigned)
Established patient visit   Patient: Helen Ramsey   DOB: 02-05-1933   88 y.o. Female  MRN: [id number] Visit Date: 11/20/2022  Today's healthcare provider: Lavon Paganini, MD   No chief complaint on file.  Subjective    HPI  Hypertension, follow-up  BP Readings from Last 3 Encounters:  06/04/22 (!) 103/44  04/08/22 (!) 116/50  02/28/22 (!) 116/50   Wt Readings from Last 3 Encounters:  06/04/22 163 lb (73.9 kg)  05/06/22 159 lb (72.1 kg)  04/08/22 159 lb 14.4 oz (72.5 kg)     She was last seen for hypertension 6 months ago.  BP at that visit was 103/44. Management since that visit includes no changes. She reports {excellent/good/fair/poor:19665} compliance with treatment. She {is/is not:9024} having side effects. {document side effects if present:1} Outside blood pressures are {enter patient reported home BP, or 'not being checked':1}.  --------------------------------------------------------------------------------------------------- Hypothyroid, follow-up  Lab Results  Component Value Date   TSH 2.920 06/04/2022   TSH 3.350 12/06/2021   TSH 6.610 (H) 08/15/2021   FREET4 1.19 06/04/2022    Wt Readings from Last 3 Encounters:  06/04/22 163 lb (73.9 kg)  05/06/22 159 lb (72.1 kg)  04/08/22 159 lb 14.4 oz (72.5 kg)    She was last seen for hypothyroid 6 months ago.  Management since that visit includes no changes. She reports {excellent/good/fair/poor:19665} compliance with treatment. She {is/is not:21021397} having side effects. {document side effects if present:1}  ----------------------------------------------------------------------------------------- Lipid/Cholesterol, Follow-up  Last lipid panel Other pertinent labs  Lab Results  Component Value Date   CHOL 182 08/15/2021   HDL 28 (L) 08/15/2021   LDLCALC 93 08/15/2021   TRIG 367 (H) 08/15/2021   CHOLHDL 6.5 (H) 08/15/2021   Lab Results  Component Value Date   ALT 19 06/04/2022   AST  20 06/04/2022   PLT 193 06/04/2022   TSH 2.920 06/04/2022     She was last seen for this 6 months ago.  Management since that visit includes no changes.  She reports {excellent/good/fair/poor:19665} compliance with treatment. She {is/is not:9024} having side effects. {document side effects if present:1}  The ASCVD Risk score (Arnett DK, et al., 2019) failed to calculate for the following reasons:   The 2019 ASCVD risk score is only valid for ages 1 to 32   The patient has a prior MI or stroke diagnosis  ---------------------------------------------------------------------------------------------------   Medications: Outpatient Medications Prior to Visit  Medication Sig   acetaminophen (TYLENOL) 500 MG tablet Take 500 mg by mouth in the morning and at bedtime.    ALPRAZolam (XANAX) 0.25 MG tablet TAKE 1 TABLET BY MOUTH TWICE DAILY AS NEEDED FOR ANXIETY   aspirin EC 81 MG tablet Take 81 mg by mouth daily.   atorvastatin (LIPITOR) 20 MG tablet TAKE 1 TABLET(20 MG) BY MOUTH DAILY   BREO ELLIPTA 100-25 MCG/ACT AEPB INHALE 1 PUFF INTO THE LUNGS DAILY   Calcium Carb-Cholecalciferol 600-25 MG-MCG CAPS Take by mouth.   cetirizine (ZYRTEC) 10 MG tablet 1 tablet 1 (one) time each day   Cholecalciferol (VITAMIN D3 PO) Take 1,000 Units by mouth.   Cholecalciferol 25 MCG (1000 UT) tablet Take by mouth.   citalopram (CELEXA) 40 MG tablet Take 1 tablet (40 mg total) by mouth daily.   cyanocobalamin 1000 MCG tablet Take 1,000 mcg by mouth daily.   Dentifrices (FLUORIDE TOOTHPASTE DT) Place onto teeth daily.    diphenhydrAMINE (BENADRYL) 25 MG tablet Take 25 mg by mouth at bedtime.  diphenhydrAMINE (SOMINEX) 25 MG tablet Take by mouth.   EMGALITY, 300 MG DOSE, 100 MG/ML SOSY SMARTSIG:300 Milligram(s) SUB-Q Once a Month   famotidine (PEPCID) 20 MG tablet TAKE 1 TABLET(20 MG) BY MOUTH DAILY   ferrous sulfate 325 (65 FE) MG tablet Take by mouth.   fluticasone furoate-vilanterol (BREO ELLIPTA)  100-25 MCG/ACT AEPB 1 puff 1 (one) time each day   folic acid (FOLVITE) 1 MG tablet Take 1 mg by mouth daily.   furosemide (LASIX) 80 MG tablet TAKE 1 TABLET(80 MG) BY MOUTH DAILY   gabapentin (NEURONTIN) 100 MG capsule Patient to take 1 in the morning, 1 in the afternoon and 2 at bedtime.   Galcanezumab-gnlm (EMGALITY [place]) Inject 300 mg as directed every 30 (thirty) days.    Galcanezumab-gnlm (EMGALITY, 300 MG DOSE,) 100 MG/ML SOSY Inject into the skin.   hydroxychloroquine (PLAQUENIL) 200 MG tablet Take 200 mg by mouth daily.   levothyroxine (SYNTHROID) 50 MCG tablet TAKE 1 TABLET(50 MCG) BY MOUTH DAILY BEFORE BREAKFAST   linaclotide (LINZESS) 72 MCG capsule Take 1 capsule (72 mcg total) by mouth daily before breakfast.   lisinopril (ZESTRIL) 5 MG tablet TAKE 1 TABLET(5 MG) BY MOUTH DAILY   MAGNESIUM GLYCINATE PO Take 400 mg by mouth daily.   magnesium hydroxide (MILK OF MAGNESIA) 400 MG/5ML suspension Take 5 mLs by mouth daily as needed for mild constipation.   melatonin 5 MG TABS Take 5 mg by mouth at bedtime as needed.   methotrexate (RHEUMATREX) 2.5 MG tablet Take 10 mg by mouth once a week.   nystatin (MYCOSTATIN/NYSTOP) powder Apply 1 Application topically 3 (three) times daily.   Omega-3 Fatty Acids (FISH OIL) 1000 MG CAPS Take by mouth.   omeprazole (PRILOSEC) 20 MG capsule TAKE 1 CAPSULE(20 MG) BY MOUTH DAILY   phenazopyridine (AZO-TABS) 95 MG tablet Take 1 tablet (95 mg total) by mouth as needed. (Patient taking differently: Take 95 mg by mouth 2 (two) times daily.)   Polyethylene Glycol 3350 (MIRALAX PO) Take by mouth.   Potassium Gluconate 2.5 MEQ TABS 99 mg 2 (two) times a day   potassium gluconate 595 (99 K) MG TABS tablet Take 1 tablet (595 mg total) by mouth 2 (two) times daily.   senna (SENOKOT) 8.6 MG tablet Take 1 tablet (8.6 mg total) by mouth daily.   sodium chloride (OCEAN) 0.65 % nasal spray Place 1 spray into the nose as needed.   traMADol (ULTRAM) 50 MG tablet TAKE 1  TABLET BY MOUTH TWICE DAILY AS NEEDED (Patient taking differently: Take 50 mg by mouth 3 (three) times daily. TAKE 1 TABLET BY MOUTH 3 times daily)   triamcinolone cream (KENALOG) 0.1 % 2 weeks on and off 2 weeks   No facility-administered medications prior to visit.    Review of Systems  Constitutional:  Negative for appetite change.  Eyes:  Negative for visual disturbance.  Respiratory:  Negative for chest tightness and shortness of breath.   Gastrointestinal:  Negative for abdominal pain, diarrhea, nausea and vomiting.  Neurological:  Negative for dizziness, light-headedness and headaches.    {Labs  Heme  Chem  Endocrine  Serology  Results Review (optional):23779}   Objective    There were no vitals taken for this visit. {Show previous vital signs (optional):23777}  Physical Exam  ***  No results found for any visits on 11/20/22.  Assessment & Plan     ***  No follow-ups on file.      {provider attestation***:1}   Levada Dy  Brita Romp, [place] [phone] (phone) [phone] (fax)  [place]

## 2022-11-19 DIAGNOSIS — I110 Hypertensive heart disease with heart failure: Secondary | ICD-10-CM | POA: Diagnosis not present

## 2022-11-19 DIAGNOSIS — G4733 Obstructive sleep apnea (adult) (pediatric): Secondary | ICD-10-CM | POA: Diagnosis not present

## 2022-11-19 DIAGNOSIS — S12000D Unspecified displaced fracture of first cervical vertebra, subsequent encounter for fracture with routine healing: Secondary | ICD-10-CM | POA: Diagnosis not present

## 2022-11-19 DIAGNOSIS — G250 Essential tremor: Secondary | ICD-10-CM | POA: Diagnosis not present

## 2022-11-19 DIAGNOSIS — K219 Gastro-esophageal reflux disease without esophagitis: Secondary | ICD-10-CM | POA: Diagnosis not present

## 2022-11-19 DIAGNOSIS — I69811 Memory deficit following other cerebrovascular disease: Secondary | ICD-10-CM | POA: Diagnosis not present

## 2022-11-19 DIAGNOSIS — D649 Anemia, unspecified: Secondary | ICD-10-CM | POA: Diagnosis not present

## 2022-11-19 DIAGNOSIS — G44091 Other trigeminal autonomic cephalgias (TAC), intractable: Secondary | ICD-10-CM | POA: Diagnosis not present

## 2022-11-19 DIAGNOSIS — E669 Obesity, unspecified: Secondary | ICD-10-CM | POA: Diagnosis not present

## 2022-11-19 DIAGNOSIS — M545 Low back pain, unspecified: Secondary | ICD-10-CM | POA: Diagnosis not present

## 2022-11-19 DIAGNOSIS — G3184 Mild cognitive impairment, so stated: Secondary | ICD-10-CM | POA: Diagnosis not present

## 2022-11-19 DIAGNOSIS — G2581 Restless legs syndrome: Secondary | ICD-10-CM | POA: Diagnosis not present

## 2022-11-19 DIAGNOSIS — G252 Other specified forms of tremor: Secondary | ICD-10-CM | POA: Diagnosis not present

## 2022-11-19 DIAGNOSIS — Z96651 Presence of right artificial knee joint: Secondary | ICD-10-CM | POA: Diagnosis not present

## 2022-11-19 DIAGNOSIS — H02403 Unspecified ptosis of bilateral eyelids: Secondary | ICD-10-CM | POA: Diagnosis not present

## 2022-11-19 DIAGNOSIS — E785 Hyperlipidemia, unspecified: Secondary | ICD-10-CM | POA: Diagnosis not present

## 2022-11-19 DIAGNOSIS — G609 Hereditary and idiopathic neuropathy, unspecified: Secondary | ICD-10-CM | POA: Diagnosis not present

## 2022-11-19 DIAGNOSIS — I69820 Aphasia following other cerebrovascular disease: Secondary | ICD-10-CM | POA: Diagnosis not present

## 2022-11-19 DIAGNOSIS — H8110 Benign paroxysmal vertigo, unspecified ear: Secondary | ICD-10-CM | POA: Diagnosis not present

## 2022-11-19 DIAGNOSIS — K589 Irritable bowel syndrome without diarrhea: Secondary | ICD-10-CM | POA: Diagnosis not present

## 2022-11-19 DIAGNOSIS — K635 Polyp of colon: Secondary | ICD-10-CM | POA: Diagnosis not present

## 2022-11-19 DIAGNOSIS — M1990 Unspecified osteoarthritis, unspecified site: Secondary | ICD-10-CM | POA: Diagnosis not present

## 2022-11-19 DIAGNOSIS — F419 Anxiety disorder, unspecified: Secondary | ICD-10-CM | POA: Diagnosis not present

## 2022-11-19 DIAGNOSIS — M5481 Occipital neuralgia: Secondary | ICD-10-CM | POA: Diagnosis not present

## 2022-11-19 DIAGNOSIS — I509 Heart failure, unspecified: Secondary | ICD-10-CM | POA: Diagnosis not present

## 2022-11-20 ENCOUNTER — Encounter: Payer: Self-pay | Admitting: Family Medicine

## 2022-11-20 ENCOUNTER — Ambulatory Visit (INDEPENDENT_AMBULATORY_CARE_PROVIDER_SITE_OTHER): Payer: Medicare Other | Admitting: Family Medicine

## 2022-11-20 VITALS — BP 128/70 | HR 61 | Temp 98.1°F | Resp 16 | Wt 165.0 lb

## 2022-11-20 DIAGNOSIS — N1832 Chronic kidney disease, stage 3b: Secondary | ICD-10-CM

## 2022-11-20 DIAGNOSIS — E039 Hypothyroidism, unspecified: Secondary | ICD-10-CM | POA: Diagnosis not present

## 2022-11-20 DIAGNOSIS — I10 Essential (primary) hypertension: Secondary | ICD-10-CM | POA: Diagnosis not present

## 2022-11-20 DIAGNOSIS — K219 Gastro-esophageal reflux disease without esophagitis: Secondary | ICD-10-CM

## 2022-11-20 DIAGNOSIS — R059 Cough, unspecified: Secondary | ICD-10-CM | POA: Diagnosis not present

## 2022-11-20 DIAGNOSIS — F3341 Major depressive disorder, recurrent, in partial remission: Secondary | ICD-10-CM

## 2022-11-20 DIAGNOSIS — Z23 Encounter for immunization: Secondary | ICD-10-CM | POA: Diagnosis not present

## 2022-11-20 DIAGNOSIS — F411 Generalized anxiety disorder: Secondary | ICD-10-CM

## 2022-11-20 DIAGNOSIS — E782 Mixed hyperlipidemia: Secondary | ICD-10-CM

## 2022-11-20 DIAGNOSIS — I5032 Chronic diastolic (congestive) heart failure: Secondary | ICD-10-CM

## 2022-11-20 MED ORDER — FLUTICASONE FUROATE-VILANTEROL 100-25 MCG/ACT IN AEPB: 1.0000 | INHALATION_SPRAY | Freq: Every day | RESPIRATORY_TRACT | 1 refills | Status: AC

## 2022-11-20 MED ORDER — FUROSEMIDE 80 MG PO TABS
80.0000 mg | ORAL_TABLET | Freq: Every day | ORAL | 1 refills | Status: DC
Start: 2022-11-20 — End: 2022-12-11

## 2022-11-20 MED ORDER — FAMOTIDINE 20 MG PO TABS: 20.0000 mg | ORAL_TABLET | Freq: Every day | ORAL | 1 refills | Status: AC

## 2022-11-20 MED ORDER — GABAPENTIN 100 MG PO CAPS: ORAL_CAPSULE | ORAL | 1 refills | Status: AC

## 2022-11-20 MED ORDER — LISINOPRIL 5 MG PO TABS: 5.0000 mg | ORAL_TABLET | Freq: Every day | ORAL | 1 refills | Status: DC

## 2022-11-20 MED ORDER — ATORVASTATIN CALCIUM 20 MG PO TABS: 20.0000 mg | ORAL_TABLET | Freq: Every day | ORAL | 1 refills | Status: AC

## 2022-11-20 MED ORDER — CITALOPRAM HYDROBROMIDE 40 MG PO TABS: 40.0000 mg | ORAL_TABLET | Freq: Every day | ORAL | 1 refills | Status: AC

## 2022-11-20 MED ORDER — ALPRAZOLAM 0.25 MG PO TABS: ORAL_TABLET | ORAL | 0 refills | Status: DC

## 2022-11-20 MED ORDER — LINACLOTIDE 145 MCG PO CAPS: 145.0000 ug | ORAL_CAPSULE | Freq: Every day | ORAL | 1 refills | Status: AC

## 2022-11-20 MED ORDER — LEVOTHYROXINE SODIUM 50 MCG PO TABS: ORAL_TABLET | ORAL | 3 refills | Status: AC

## 2022-11-20 MED ORDER — OMEPRAZOLE 20 MG PO CPDR
DELAYED_RELEASE_CAPSULE | ORAL | 1 refills | Status: DC
Start: 2022-11-20 — End: 2023-09-08

## 2022-11-20 NOTE — Assessment & Plan Note (Signed)
Chronic and stable Continue diuretic and K supplement at current dose Euvolemic No changes today

## 2022-11-20 NOTE — Assessment & Plan Note (Signed)
Well controlled Continue current medications Recheck metabolic panel F/u in 6 months  

## 2022-11-20 NOTE — Assessment & Plan Note (Signed)
Previously well controlled Continue statin Repeat FLP and CMP  

## 2022-11-20 NOTE — Assessment & Plan Note (Signed)
Previously well controlled Continue Synthroid at current dose  Recheck TSH and adjust Synthroid as indicated   

## 2022-11-20 NOTE — Assessment & Plan Note (Addendum)
Chronic and stable Recheck metabolic panel Avoid nephrotoxic meds  F/b nephro also

## 2022-11-20 NOTE — Assessment & Plan Note (Signed)
Chronic and stable No med changes today

## 2022-11-21 DIAGNOSIS — G3184 Mild cognitive impairment, so stated: Secondary | ICD-10-CM | POA: Diagnosis not present

## 2022-11-21 DIAGNOSIS — G252 Other specified forms of tremor: Secondary | ICD-10-CM | POA: Diagnosis not present

## 2022-11-21 DIAGNOSIS — G44091 Other trigeminal autonomic cephalgias (TAC), intractable: Secondary | ICD-10-CM | POA: Diagnosis not present

## 2022-11-21 DIAGNOSIS — I69811 Memory deficit following other cerebrovascular disease: Secondary | ICD-10-CM | POA: Diagnosis not present

## 2022-11-21 DIAGNOSIS — S12000D Unspecified displaced fracture of first cervical vertebra, subsequent encounter for fracture with routine healing: Secondary | ICD-10-CM | POA: Diagnosis not present

## 2022-11-21 DIAGNOSIS — I69820 Aphasia following other cerebrovascular disease: Secondary | ICD-10-CM | POA: Diagnosis not present

## 2022-11-21 LAB — LIPID PANEL
Chol/HDL Ratio: 3.6 ratio (ref 0.0–4.4)
Cholesterol, Total: 159 mg/dL (ref 100–199)
HDL: 44 mg/dL (ref >39)
LDL Chol Calc (NIH): 75 mg/dL (ref 0–99)
Triglycerides: 243 mg/dL — ABNORMAL HIGH (ref 0–149)
VLDL Cholesterol Cal: 40 mg/dL (ref 5–40)

## 2022-11-21 LAB — TSH: TSH: 3.6 u[IU]/mL (ref 0.450–4.500)

## 2022-11-27 ENCOUNTER — Other Ambulatory Visit: Payer: Self-pay | Admitting: *Deleted

## 2022-11-27 ENCOUNTER — Inpatient Hospital Stay: Payer: Medicare Other | Attending: Oncology

## 2022-11-27 ENCOUNTER — Other Ambulatory Visit: Payer: Medicare Other

## 2022-11-27 DIAGNOSIS — R778 Other specified abnormalities of plasma proteins: Secondary | ICD-10-CM

## 2022-11-27 DIAGNOSIS — D649 Anemia, unspecified: Secondary | ICD-10-CM | POA: Insufficient documentation

## 2022-11-27 DIAGNOSIS — R768 Other specified abnormal immunological findings in serum: Secondary | ICD-10-CM | POA: Insufficient documentation

## 2022-11-27 DIAGNOSIS — S12000D Unspecified displaced fracture of first cervical vertebra, subsequent encounter for fracture with routine healing: Secondary | ICD-10-CM | POA: Diagnosis not present

## 2022-11-27 DIAGNOSIS — G3184 Mild cognitive impairment, so stated: Secondary | ICD-10-CM | POA: Diagnosis not present

## 2022-11-27 DIAGNOSIS — R5383 Other fatigue: Secondary | ICD-10-CM | POA: Insufficient documentation

## 2022-11-27 DIAGNOSIS — I69820 Aphasia following other cerebrovascular disease: Secondary | ICD-10-CM | POA: Diagnosis not present

## 2022-11-27 DIAGNOSIS — I10 Essential (primary) hypertension: Secondary | ICD-10-CM | POA: Diagnosis not present

## 2022-11-27 DIAGNOSIS — I5030 Unspecified diastolic (congestive) heart failure: Secondary | ICD-10-CM | POA: Diagnosis not present

## 2022-11-27 DIAGNOSIS — Z79899 Other long term (current) drug therapy: Secondary | ICD-10-CM | POA: Insufficient documentation

## 2022-11-27 DIAGNOSIS — I69811 Memory deficit following other cerebrovascular disease: Secondary | ICD-10-CM | POA: Diagnosis not present

## 2022-11-27 DIAGNOSIS — I700 Atherosclerosis of aorta: Secondary | ICD-10-CM | POA: Diagnosis not present

## 2022-11-27 DIAGNOSIS — G252 Other specified forms of tremor: Secondary | ICD-10-CM | POA: Diagnosis not present

## 2022-11-27 DIAGNOSIS — N179 Acute kidney failure, unspecified: Secondary | ICD-10-CM

## 2022-11-27 DIAGNOSIS — G44091 Other trigeminal autonomic cephalgias (TAC), intractable: Secondary | ICD-10-CM | POA: Diagnosis not present

## 2022-11-27 LAB — COMPREHENSIVE METABOLIC PANEL
ALT: 39 U/L (ref 0–44)
AST: 38 U/L (ref 15–41)
Albumin: 3.9 g/dL (ref 3.5–5.0)
Alkaline Phosphatase: 58 U/L (ref 38–126)
Anion gap: 12 (ref 5–15)
BUN: 30 mg/dL — ABNORMAL HIGH (ref 8–23)
CO2: 29 mmol/L (ref 22–32)
Calcium: 9.5 mg/dL (ref 8.9–10.3)
Chloride: 93 mmol/L — ABNORMAL LOW (ref 98–111)
Creatinine, Ser: 1.38 mg/dL — ABNORMAL HIGH (ref 0.44–1.00)
GFR, Estimated: 37 mL/min — ABNORMAL LOW (ref >60)
Glucose, Bld: 116 mg/dL — ABNORMAL HIGH (ref 70–99)
Potassium: 3.3 mmol/L — ABNORMAL LOW (ref 3.5–5.1)
Sodium: 134 mmol/L — ABNORMAL LOW (ref 135–145)
Total Bilirubin: 0.7 mg/dL (ref 0.3–1.2)
Total Protein: 6.6 g/dL (ref 6.5–8.1)

## 2022-11-27 LAB — IRON AND TIBC
Iron: 113 ug/dL (ref 28–170)
Saturation Ratios: 37 % — ABNORMAL HIGH (ref 10.4–31.8)
TIBC: 309 ug/dL (ref 250–450)
UIBC: 196 ug/dL

## 2022-11-27 LAB — CBC WITH DIFFERENTIAL/PLATELET
Abs Immature Granulocytes: 0.02 10*3/uL (ref 0.00–0.07)
Basophils Absolute: 0 10*3/uL (ref 0.0–0.1)
Basophils Relative: 1 %
Eosinophils Absolute: 0.4 10*3/uL (ref 0.0–0.5)
Eosinophils Relative: 6 %
HCT: 30.5 % — ABNORMAL LOW (ref 36.0–46.0)
Hemoglobin: 10.3 g/dL — ABNORMAL LOW (ref 12.0–15.0)
Immature Granulocytes: 0 %
Lymphocytes Relative: 25 %
Lymphs Abs: 1.5 10*3/uL (ref 0.7–4.0)
MCH: 33.4 pg (ref 26.0–34.0)
MCHC: 33.8 g/dL (ref 30.0–36.0)
MCV: 99 fL (ref 80.0–100.0)
Monocytes Absolute: 0.7 10*3/uL (ref 0.1–1.0)
Monocytes Relative: 11 %
Neutro Abs: 3.3 10*3/uL (ref 1.7–7.7)
Neutrophils Relative %: 57 %
Platelets: 192 10*3/uL (ref 150–400)
RBC: 3.08 MIL/uL — ABNORMAL LOW (ref 3.87–5.11)
RDW: 14.2 % (ref 11.5–15.5)
WBC: 5.9 10*3/uL (ref 4.0–10.5)
nRBC: 0 % (ref 0.0–0.2)

## 2022-11-27 LAB — FERRITIN: Ferritin: 119 ng/mL (ref 11–307)

## 2022-11-28 DIAGNOSIS — I69820 Aphasia following other cerebrovascular disease: Secondary | ICD-10-CM | POA: Diagnosis not present

## 2022-11-28 DIAGNOSIS — I69811 Memory deficit following other cerebrovascular disease: Secondary | ICD-10-CM | POA: Diagnosis not present

## 2022-11-28 DIAGNOSIS — G44091 Other trigeminal autonomic cephalgias (TAC), intractable: Secondary | ICD-10-CM | POA: Diagnosis not present

## 2022-11-28 DIAGNOSIS — G3184 Mild cognitive impairment, so stated: Secondary | ICD-10-CM | POA: Diagnosis not present

## 2022-11-28 DIAGNOSIS — G252 Other specified forms of tremor: Secondary | ICD-10-CM | POA: Diagnosis not present

## 2022-11-28 DIAGNOSIS — S12000D Unspecified displaced fracture of first cervical vertebra, subsequent encounter for fracture with routine healing: Secondary | ICD-10-CM | POA: Diagnosis not present

## 2022-11-28 LAB — KAPPA/LAMBDA LIGHT CHAINS
Kappa free light chain: 53.2 mg/L — ABNORMAL HIGH (ref 3.3–19.4)
Kappa, lambda light chain ratio: 1.49 (ref 0.26–1.65)
Lambda free light chains: 35.6 mg/L — ABNORMAL HIGH (ref 5.7–26.3)

## 2022-11-29 ENCOUNTER — Other Ambulatory Visit: Payer: Self-pay | Admitting: Family Medicine

## 2022-12-01 NOTE — Telephone Encounter (Signed)
Dc'd 11/20/22 Dr. Brita Romp Dose changed  Requested Prescriptions  Refused Prescriptions Disp Refills   LINZESS 72 MCG capsule [Pharmacy Med Name: Rolan Lipa 72MCG CAPSULES] 30 capsule 1    Sig: TAKE 1 CAPSULE(72 MCG) BY MOUTH DAILY BEFORE BREAKFAST     Gastroenterology: Irritable Bowel Syndrome Passed - 11/29/2022  3:35 AM      Passed - Valid encounter within last 12 months    Recent Outpatient Visits           1 week ago Essential (primary) hypertension   [facility] [place], [name], MD   6 months ago Chronic heart failure with preserved ejection fraction [facility])   [facility] Tally Joe T, FNP   7 months ago Bilateral calf pain   [facility] [place] [facility] [place], [name], MD   9 months ago Acute on chronic heart failure with preserved ejection fraction [facility])   [facility], [name], MD   9 months ago Pressure injury of left buttock, stage 2 [facility])   [facility] Bacigalupo, [name], MD       Future Appointments             In 5 months Bacigalupo, [name], MD [facility], [place]

## 2022-12-04 DIAGNOSIS — N1832 Chronic kidney disease, stage 3b: Secondary | ICD-10-CM | POA: Diagnosis not present

## 2022-12-04 DIAGNOSIS — I5030 Unspecified diastolic (congestive) heart failure: Secondary | ICD-10-CM | POA: Diagnosis not present

## 2022-12-04 DIAGNOSIS — R0602 Shortness of breath: Secondary | ICD-10-CM | POA: Diagnosis not present

## 2022-12-04 DIAGNOSIS — I10 Essential (primary) hypertension: Secondary | ICD-10-CM | POA: Diagnosis not present

## 2022-12-04 DIAGNOSIS — G4733 Obstructive sleep apnea (adult) (pediatric): Secondary | ICD-10-CM | POA: Diagnosis not present

## 2022-12-04 DIAGNOSIS — E785 Hyperlipidemia, unspecified: Secondary | ICD-10-CM | POA: Diagnosis not present

## 2022-12-04 DIAGNOSIS — K219 Gastro-esophageal reflux disease without esophagitis: Secondary | ICD-10-CM | POA: Diagnosis not present

## 2022-12-04 LAB — MULTIPLE MYELOMA PANEL, SERUM
Albumin SerPl Elph-Mcnc: 3.7 g/dL (ref 2.9–4.4)
Albumin/Glob SerPl: 1.4 (ref 0.7–1.7)
Alpha 1: 0.3 g/dL (ref 0.0–0.4)
Alpha2 Glob SerPl Elph-Mcnc: 0.7 g/dL (ref 0.4–1.0)
B-Globulin SerPl Elph-Mcnc: 1.1 g/dL (ref 0.7–1.3)
Gamma Glob SerPl Elph-Mcnc: 0.8 g/dL (ref 0.4–1.8)
Globulin, Total: 2.8 g/dL (ref 2.2–3.9)
IgA: 157 mg/dL (ref 64–422)
IgG (Immunoglobin G), Serum: 735 mg/dL (ref 586–1602)
IgM (Immunoglobulin M), Srm: 71 mg/dL (ref 26–217)
Total Protein ELP: 6.5 g/dL (ref 6.0–8.5)

## 2022-12-05 ENCOUNTER — Inpatient Hospital Stay (HOSPITAL_BASED_OUTPATIENT_CLINIC_OR_DEPARTMENT_OTHER): Payer: Medicare Other | Admitting: Oncology

## 2022-12-05 DIAGNOSIS — R778 Other specified abnormalities of plasma proteins: Secondary | ICD-10-CM

## 2022-12-06 ENCOUNTER — Encounter: Payer: Self-pay | Admitting: Oncology

## 2022-12-06 NOTE — Progress Notes (Signed)
I connected with Helen Ramsey on 12/06/22 at  3:00 PM EST by telephone visit and verified that I am speaking with the correct person using two identifiers.   I discussed the limitations, risks, security and privacy concerns of performing an evaluation and management service by telemedicine and the availability of in-person appointments. I also discussed with the patient that there may be a patient responsible charge related to this service. The patient expressed understanding and agreed to proceed.  Other persons participating in the visit and their role in the encounter:  patient and her daughter  Patient's location:  home Provider's location:  cancer center  Chief Complaint: Routine follow-up of MGUS  History of present illness: Patient is a 89 year old female who was last seen by me in November 2019.  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  She was noted to have IgG lambda M protein on immunofixation but not on SPEP.  Repeat myeloma studies in May 2018 did not show any M protein in both SPEP and IFE and therefore patient was discharged from my clinic.  She is presently seeing rheumatology after she was found to have morphea over her right thigh which is being treated by dermatology.  She was worked up for possible systemic sclerosis by rheumatology but was not found to have the same.   Results of blood work from 05/23/2022 were as follows: CBC showed white count of 6.6, H&H of 11.7/35.4 with a platelet count of 197.  Ferritin levels were normal at 75 with normal iron studies.  Free light chain kappa was elevated at 44.8 with a ratio 1.87 myeloma panel in the past has not shown any M protein  Interval history patient is doing well for her age.  She has not had any recent hospitalizations.  Appetite and weight have remained stable.   Review of Systems  Constitutional:  Positive for malaise/fatigue. Negative for chills, fever and weight loss.  HENT:  Negative for congestion, ear  discharge and nosebleeds.   Eyes:  Negative for blurred vision.  Respiratory:  Negative for cough, hemoptysis, sputum production, shortness of breath and wheezing.   Cardiovascular:  Negative for chest pain, palpitations, orthopnea and claudication.  Gastrointestinal:  Negative for abdominal pain, blood in stool, constipation, diarrhea, heartburn, melena, nausea and vomiting.  Genitourinary:  Negative for dysuria, flank pain, frequency, hematuria and urgency.  Musculoskeletal:  Negative for back pain, joint pain and myalgias.  Skin:  Negative for rash.  Neurological:  Negative for dizziness, tingling, focal weakness, seizures, weakness and headaches.  Endo/Heme/Allergies:  Does not bruise/bleed easily.  Psychiatric/Behavioral:  Negative for depression and suicidal ideas. The patient does not have insomnia.     Allergies  Allergen Reactions   Amoxicillin    Clindamycin/Lincomycin    Doxycycline    Levaquin [Levofloxacin In D5w]    Mirabegron Nausea And Vomiting   Nsaids Other (See Comments)    GI upset   Oxycodone Nausea Only    Other reaction(s): Hallucination   Phenergan [Promethazine Hcl]    Singulair [Montelukast]    Vicodin [Hydrocodone-Acetaminophen]    Zocor [Simvastatin]    Zoloft  [Sertraline] Other (See Comments)   Ampicillin Rash   Bactrim [Sulfamethoxazole-Trimethoprim] Rash   Levofloxacin Other (See Comments)    Weakness   Penicillins Rash    Ampicillin, Clindamycin, Doxycycline Levaquin-severe headache, muscle and joint aches, weak   Tolmetin     Other reaction(s): Other (See Comments) GI upset    Past Medical  History:  Diagnosis Date   Abnormal SPEP    Allergic rhinitis, cause unspecified    Allergic rhinitis, cause unspecified    Atherosclerosis of aorta (HCC)    Back pain    lumbar   Degeneration of lumbar or lumbosacral intervertebral disc    Diverticulitis of colon (without mention of hemorrhage)(562.11)    Dizziness and giddiness    Dysthymic  disorder    Headache(784.0)    Myalgia and myositis, unspecified    Osteoporosis, unspecified    Other and unspecified hyperlipidemia    Other diseases of nasal cavity and sinuses(478.19)    Other specified disorders of bladder    Syncope    Trigger finger (acquired)    Unspecified essential hypertension    Unspecified sleep apnea    Urinary frequency     Past Surgical History:  Procedure Laterality Date   ABDOMINAL HYSTERECTOMY     ADENOIDECTOMY     APPENDECTOMY     ARTHROSCOPIC KNEE SURGERY     BREAST BIOPSY Right    lymph node surgery, benign    BREAST EXCISIONAL BIOPSY     CARDIAC CATHETERIZATION     [facility]   CARPAL TUNNEL RELEASE     CATARACT EXTRACTION W/ INTRAOCULAR LENS IMPLANT & ANTERIOR VITRECTOMY, BILATERAL     CHOLECYSTECTOMY     KNEE ARTHROPLASTY Right 01/30/2016   Procedure: COMPUTER ASSISTED TOTAL KNEE ARTHROPLASTY;  Surgeon: Dereck Leep, MD;  Location: [facility] ORS;  Service: Orthopedics;  Laterality: Right;   R/O Lymph Nodes; Right Axilla     ROTATOR CUFF REPAIR Right    SPG block  03/20/2016   sphenopalatine ganglion and maxillary division of trigeminal nerve block block Dr. Manuella Ghazi   TEMPORAL ARTERY BIOPSY / LIGATION     TONSILLECTOMY     TUBAL LIGATION      Social History   Socioeconomic History   Marital status: Widowed    Spouse name: Not on file   Number of children: 3   Years of education: Not on file   Highest education level: Some college, no degree  Occupational History   Occupation: retired  Tobacco Use   Smoking status: Never   Smokeless tobacco: Never  Vaping Use   Vaping Use: Never used  Substance and Sexual Activity   Alcohol use: No   Drug use: No   Sexual activity: Not on file  Other Topics Concern   Not on file  Social History Narrative   Not on file   Social Determinants of Health   Financial Resource Strain: Low Risk  (05/06/2022)   Overall Financial Resource Strain (CARDIA)    Difficulty of Paying Living Expenses: Not  hard at all  Food Insecurity: No Food Insecurity (07/02/2022)   Hunger Vital Sign    Worried About Running Out of Food in the Last Year: Never true    Melbeta in the Last Year: Never true  Transportation Needs: No Transportation Needs (07/02/2022)   PRAPARE - Hydrologist (Medical): No    Lack of Transportation (Non-Medical): No  Physical Activity: Insufficiently Active (05/06/2022)   Exercise Vital Sign    Days of Exercise per Week: 2 days    Minutes of Exercise per Session: 20 min  Stress: No Stress Concern Present (05/06/2022)   [place]    Feeling of Stress : Not at all  Social Connections: Moderately Isolated (05/06/2022)   Social Connection and Isolation  Panel [NHANES]    Frequency of Communication with Friends and Family: More than three times a week    Frequency of Social Gatherings with Friends and Family: Once a week    Attends Religious Services: More than 4 times per year    Active Member of [employer] or Organizations: No    Attends Archivist Meetings: Never    Marital Status: Widowed  Intimate Partner Violence: Not At Risk (05/06/2022)   Humiliation, Afraid, Rape, and Kick questionnaire    Fear of Current or Ex-Partner: No    Emotionally Abused: No    Physically Abused: No    Sexually Abused: No    Family History  Problem Relation Age of Onset   Heart disease Mother    Depression Mother    Colon polyps Mother    Dementia Mother    Heart disease Father    Dementia Father    Heart disease Brother    Hypertension Brother    Heart disease Brother    Hypertension Brother    Kidney cancer Neg Hx    Bladder Cancer Neg Hx      Current Outpatient Medications:    acetaminophen (TYLENOL) 500 MG tablet, Take 500 mg by mouth in the morning and at bedtime. , Disp: , Rfl:    ALPRAZolam (XANAX) 0.25 MG tablet, TAKE 1 TABLET BY MOUTH TWICE DAILY AS NEEDED FOR  ANXIETY, Disp: 60 tablet, Rfl: 0   aspirin EC 81 MG tablet, Take 81 mg by mouth daily., Disp: , Rfl:    atorvastatin (LIPITOR) 20 MG tablet, Take 1 tablet (20 mg total) by mouth daily., Disp: 90 tablet, Rfl: 1   cetirizine (ZYRTEC) 10 MG tablet, 1 tablet 1 (one) time each day, Disp: , Rfl:    Cholecalciferol (VITAMIN D3 PO), Take 1,000 Units by mouth., Disp: , Rfl:    citalopram (CELEXA) 40 MG tablet, Take 1 tablet (40 mg total) by mouth daily., Disp: 90 tablet, Rfl: 1   cyanocobalamin 1000 MCG tablet, Take 1,000 mcg by mouth daily., Disp: , Rfl:    Dentifrices (FLUORIDE TOOTHPASTE DT), Place onto teeth daily. , Disp: , Rfl:    diphenhydrAMINE (BENADRYL) 25 MG tablet, Take 25 mg by mouth at bedtime., Disp: , Rfl:    famotidine (PEPCID) 20 MG tablet, Take 1 tablet (20 mg total) by mouth daily., Disp: 90 tablet, Rfl: 1   ferrous sulfate 325 (65 FE) MG tablet, Take by mouth., Disp: , Rfl:    fluticasone furoate-vilanterol (BREO ELLIPTA) 100-25 MCG/ACT AEPB, Inhale 1 puff into the lungs daily., Disp: 180 each, Rfl: 1   folic acid (FOLVITE) 1 MG tablet, Take 1 mg by mouth daily., Disp: , Rfl:    furosemide (LASIX) 80 MG tablet, Take 1 tablet (80 mg total) by mouth daily., Disp: 90 tablet, Rfl: 1   gabapentin (NEURONTIN) 100 MG capsule, Take 2 capsules (200 mg total) by mouth 2 (two) times daily AND 3 capsules (300 mg total) at bedtime., Disp: 630 capsule, Rfl: 1   Galcanezumab-gnlm (EMGALITY [place]), Inject 300 mg as directed every 30 (thirty) days. , Disp: , Rfl: 0   hydroxychloroquine (PLAQUENIL) 200 MG tablet, Take 200 mg by mouth daily., Disp: , Rfl:    levothyroxine (SYNTHROID) 50 MCG tablet, TAKE 1 TABLET(50 MCG) BY MOUTH DAILY BEFORE BREAKFAST, Disp: 90 tablet, Rfl: 3   linaclotide (LINZESS) 145 MCG CAPS capsule, Take 1 capsule (145 mcg total) by mouth daily before breakfast., Disp: 90 capsule, Rfl: 1  lisinopril (ZESTRIL) 5 MG tablet, Take 1 tablet (5 mg total) by mouth daily., Disp: 90 tablet,  Rfl: 1   MAGNESIUM GLYCINATE PO, Take 400 mg by mouth daily., Disp: , Rfl:    magnesium hydroxide (MILK OF MAGNESIA) 400 MG/5ML suspension, Take 5 mLs by mouth daily as needed for mild constipation., Disp: , Rfl:    melatonin 5 MG TABS, Take 5 mg by mouth at bedtime as needed., Disp: , Rfl:    methotrexate (RHEUMATREX) 2.5 MG tablet, Take 10 mg by mouth once a week., Disp: , Rfl:    nystatin (MYCOSTATIN/NYSTOP) powder, Apply 1 Application topically 3 (three) times daily., Disp: 60 g, Rfl: 11   Omega-3 Fatty Acids (FISH OIL) 1000 MG CAPS, Take by mouth., Disp: , Rfl:    omeprazole (PRILOSEC) 20 MG capsule, TAKE 1 CAPSULE(20 MG) BY MOUTH DAILY, Disp: 90 capsule, Rfl: 1   phenazopyridine (AZO-TABS) 95 MG tablet, Take 1 tablet (95 mg total) by mouth as needed. (Patient taking differently: Take 95 mg by mouth 2 (two) times daily.), Disp: 30 tablet, Rfl: 3   Polyethylene Glycol 3350 (MIRALAX PO), Take by mouth., Disp: , Rfl:    potassium gluconate 595 (99 K) MG TABS tablet, Take 1 tablet (595 mg total) by mouth 2 (two) times daily., Disp: 180 tablet, Rfl: 1   senna (SENOKOT) 8.6 MG tablet, Take 1 tablet (8.6 mg total) by mouth daily., Disp: 30 tablet, Rfl: 5   sodium chloride (OCEAN) 0.65 % nasal spray, Place 1 spray into the nose as needed., Disp: , Rfl:    traMADol (ULTRAM) 50 MG tablet, TAKE 1 TABLET BY MOUTH TWICE DAILY AS NEEDED (Patient taking differently: Take 50 mg by mouth 3 (three) times daily. TAKE 1 TABLET BY MOUTH 3 times daily), Disp: 180 tablet, Rfl: 1   triamcinolone cream (KENALOG) 0.1 %, 2 weeks on and off 2 weeks, Disp: , Rfl:   No results found.  No images are attached to the encounter.      Latest Ref Rng & Units 11/27/2022    9:40 AM  CMP  Glucose 70 - 99 mg/dL 116   BUN 8 - 23 mg/dL 30   Creatinine 0.44 - 1.00 mg/dL 1.38   Sodium 135 - 145 mmol/L 134   Potassium 3.5 - 5.1 mmol/L 3.3   Chloride 98 - 111 mmol/L 93   CO2 22 - 32 mmol/L 29   Calcium 8.9 - 10.3 mg/dL 9.5    Total Protein 6.5 - 8.1 g/dL 6.6   Total Bilirubin 0.3 - 1.2 mg/dL 0.7   Alkaline Phos 38 - 126 U/L 58   AST 15 - 41 U/L 38   ALT 0 - 44 U/L 39       Latest Ref Rng & Units 11/27/2022    9:40 AM  CBC  WBC 4.0 - 10.5 K/uL 5.9   Hemoglobin 12.0 - 15.0 g/dL 10.3   Hematocrit 36.0 - 46.0 % 30.5   Platelets 150 - 400 K/uL 192     Assessment and plan:   Patient is a 89 year old female and this is a routine MGUS follow-up visit   Patient was referred to [place] for possible abnormal SPEP.  Myeloma panel on 2 different occasions does not show any evidence of M protein.  Both kappa and lambda free light chains are elevated with a normal ratio which can be seen in patients with CKD and patient does have mild CKD given that her creatinine has been fluctuating between 1.3-1.7.  Iron studies are within normal limits.  She has mild chronic anemia with a hemoglobin between 10-11 likely secondary to chronic disease.  She does not require any active treatment for MGUS at this time.  Also with her hemoglobin remaining close to 10 she does not require any EPO injections for anemia.  Given the stability of her counts I will see her back in 1 year's time with CBC CMP myeloma panel and serum free light chains  Follow-up instructions:  I discussed the assessment and treatment plan with the patient. The patient was provided an opportunity to ask questions and all were answered. The patient agreed with the plan and demonstrated an understanding of the instructions.   The patient was advised to call back or seek an in-person evaluation if the symptoms worsen or if the condition fails to improve as anticipated.  I provided 12 minutes of non face-to-face telephone visit time during this encounter, and > 50% was spent counseling as documented under my assessment & plan.  Visit Diagnosis: 1. Abnormal SPEP     Dr. Randa Evens, MD, MPH [facility] at [facility] Tel- [phone] 12/06/2022 11:26 AM

## 2022-12-10 ENCOUNTER — Observation Stay
Admission: EM | Admit: 2022-12-10 | Discharge: 2022-12-11 | Disposition: A | Discharge status: Home Health | Payer: Medicare Other | Attending: Internal Medicine | Admitting: Internal Medicine

## 2022-12-10 ENCOUNTER — Other Ambulatory Visit: Payer: Self-pay

## 2022-12-10 ENCOUNTER — Emergency Department: Payer: Medicare Other

## 2022-12-10 DIAGNOSIS — Z96651 Presence of right artificial knee joint: Secondary | ICD-10-CM | POA: Diagnosis not present

## 2022-12-10 DIAGNOSIS — I5033 Acute on chronic diastolic (congestive) heart failure: Secondary | ICD-10-CM | POA: Insufficient documentation

## 2022-12-10 DIAGNOSIS — I5030 Unspecified diastolic (congestive) heart failure: Secondary | ICD-10-CM | POA: Diagnosis present

## 2022-12-10 DIAGNOSIS — R911 Solitary pulmonary nodule: Secondary | ICD-10-CM | POA: Diagnosis not present

## 2022-12-10 DIAGNOSIS — F3341 Major depressive disorder, recurrent, in partial remission: Secondary | ICD-10-CM | POA: Diagnosis present

## 2022-12-10 DIAGNOSIS — N1832 Chronic kidney disease, stage 3b: Secondary | ICD-10-CM | POA: Diagnosis present

## 2022-12-10 DIAGNOSIS — R531 Weakness: Secondary | ICD-10-CM | POA: Diagnosis not present

## 2022-12-10 DIAGNOSIS — Z79899 Other long term (current) drug therapy: Secondary | ICD-10-CM | POA: Diagnosis not present

## 2022-12-10 DIAGNOSIS — R0602 Shortness of breath: Secondary | ICD-10-CM | POA: Diagnosis not present

## 2022-12-10 DIAGNOSIS — Z7982 Long term (current) use of aspirin: Secondary | ICD-10-CM | POA: Insufficient documentation

## 2022-12-10 DIAGNOSIS — R768 Other specified abnormal immunological findings in serum: Secondary | ICD-10-CM | POA: Diagnosis present

## 2022-12-10 DIAGNOSIS — I130 Hypertensive heart and chronic kidney disease with heart failure and stage 1 through stage 4 chronic kidney disease, or unspecified chronic kidney disease: Secondary | ICD-10-CM | POA: Diagnosis not present

## 2022-12-10 DIAGNOSIS — E039 Hypothyroidism, unspecified: Secondary | ICD-10-CM | POA: Insufficient documentation

## 2022-12-10 DIAGNOSIS — R251 Tremor, unspecified: Secondary | ICD-10-CM | POA: Diagnosis present

## 2022-12-10 DIAGNOSIS — I110 Hypertensive heart disease with heart failure: Secondary | ICD-10-CM | POA: Diagnosis not present

## 2022-12-10 DIAGNOSIS — J9611 Chronic respiratory failure with hypoxia: Secondary | ICD-10-CM | POA: Diagnosis present

## 2022-12-10 DIAGNOSIS — E782 Mixed hyperlipidemia: Secondary | ICD-10-CM | POA: Diagnosis present

## 2022-12-10 DIAGNOSIS — M4312 Spondylolisthesis, cervical region: Secondary | ICD-10-CM | POA: Diagnosis not present

## 2022-12-10 DIAGNOSIS — S0990XA Unspecified injury of head, initial encounter: Secondary | ICD-10-CM | POA: Diagnosis not present

## 2022-12-10 DIAGNOSIS — I509 Heart failure, unspecified: Secondary | ICD-10-CM | POA: Diagnosis not present

## 2022-12-10 DIAGNOSIS — G2581 Restless legs syndrome: Secondary | ICD-10-CM | POA: Diagnosis present

## 2022-12-10 DIAGNOSIS — J309 Allergic rhinitis, unspecified: Secondary | ICD-10-CM | POA: Diagnosis present

## 2022-12-10 DIAGNOSIS — G3184 Mild cognitive impairment, so stated: Secondary | ICD-10-CM | POA: Diagnosis present

## 2022-12-10 DIAGNOSIS — J90 Pleural effusion, not elsewhere classified: Secondary | ICD-10-CM | POA: Diagnosis not present

## 2022-12-10 DIAGNOSIS — K219 Gastro-esophageal reflux disease without esophagitis: Secondary | ICD-10-CM | POA: Diagnosis present

## 2022-12-10 DIAGNOSIS — I10 Essential (primary) hypertension: Secondary | ICD-10-CM | POA: Diagnosis present

## 2022-12-10 LAB — CBC WITH DIFFERENTIAL/PLATELET
Abs Immature Granulocytes: 0.05 10*3/uL (ref 0.00–0.07)
Basophils Absolute: 0 10*3/uL (ref 0.0–0.1)
Basophils Relative: 1 %
Eosinophils Absolute: 0.3 10*3/uL (ref 0.0–0.5)
Eosinophils Relative: 5 %
HCT: 31.7 % — ABNORMAL LOW (ref 36.0–46.0)
Hemoglobin: 10.2 g/dL — ABNORMAL LOW (ref 12.0–15.0)
Immature Granulocytes: 1 %
Lymphocytes Relative: 18 %
Lymphs Abs: 1.1 10*3/uL (ref 0.7–4.0)
MCH: 32.9 pg (ref 26.0–34.0)
MCHC: 32.2 g/dL (ref 30.0–36.0)
MCV: 102.3 fL — ABNORMAL HIGH (ref 80.0–100.0)
Monocytes Absolute: 0.7 10*3/uL (ref 0.1–1.0)
Monocytes Relative: 12 %
Neutro Abs: 3.8 10*3/uL (ref 1.7–7.7)
Neutrophils Relative %: 63 %
Platelets: 188 10*3/uL (ref 150–400)
RBC: 3.1 MIL/uL — ABNORMAL LOW (ref 3.87–5.11)
RDW: 14.4 % (ref 11.5–15.5)
WBC: 5.9 10*3/uL (ref 4.0–10.5)
nRBC: 0 % (ref 0.0–0.2)

## 2022-12-10 LAB — BASIC METABOLIC PANEL
Anion gap: 10 (ref 5–15)
BUN: 21 mg/dL (ref 8–23)
CO2: 28 mmol/L (ref 22–32)
Calcium: 9.3 mg/dL (ref 8.9–10.3)
Chloride: 98 mmol/L (ref 98–111)
Creatinine, Ser: 1.38 mg/dL — ABNORMAL HIGH (ref 0.44–1.00)
GFR, Estimated: 37 mL/min — ABNORMAL LOW (ref >60)
Glucose, Bld: 95 mg/dL (ref 70–99)
Potassium: 4 mmol/L (ref 3.5–5.1)
Sodium: 136 mmol/L (ref 135–145)

## 2022-12-10 LAB — URINALYSIS, ROUTINE W REFLEX MICROSCOPIC
Bilirubin Urine: NEGATIVE
Glucose, UA: NEGATIVE mg/dL
Hgb urine dipstick: NEGATIVE
Ketones, ur: NEGATIVE mg/dL
Leukocytes,Ua: NEGATIVE
Nitrite: NEGATIVE
Protein, ur: NEGATIVE mg/dL
Specific Gravity, Urine: 1.005 (ref 1.005–1.030)
pH: 8 (ref 5.0–8.0)

## 2022-12-10 LAB — TROPONIN I (HIGH SENSITIVITY)
Troponin I (High Sensitivity): 7 ng/L (ref <18)
Troponin I (High Sensitivity): 8 ng/L (ref <18)

## 2022-12-10 LAB — BRAIN NATRIURETIC PEPTIDE: B Natriuretic Peptide: 140.7 pg/mL — ABNORMAL HIGH (ref 0.0–100.0)

## 2022-12-10 MED ORDER — FOLIC ACID 1 MG PO TABS
1.0000 mg | ORAL_TABLET | Freq: Every day | ORAL | Status: DC
  Administered 2022-12-11: 1 mg via ORAL
  Filled 2022-12-10: qty 1

## 2022-12-10 MED ORDER — GABAPENTIN 100 MG PO CAPS
200.0000 mg | ORAL_CAPSULE | Freq: Two times a day (BID) | ORAL | Status: DC
  Administered 2022-12-11 (×2): 200 mg via ORAL
  Filled 2022-12-10 (×2): qty 2

## 2022-12-10 MED ORDER — MAGNESIUM OXIDE -MG SUPPLEMENT 400 (240 MG) MG PO TABS
400.0000 mg | ORAL_TABLET | Freq: Every day | ORAL | Status: DC
  Administered 2022-12-10: 400 mg via ORAL
  Filled 2022-12-10: qty 1

## 2022-12-10 MED ORDER — ATORVASTATIN CALCIUM 20 MG PO TABS
20.0000 mg | ORAL_TABLET | Freq: Every day | ORAL | Status: DC
  Administered 2022-12-11: 20 mg via ORAL
  Filled 2022-12-10: qty 1

## 2022-12-10 MED ORDER — PANTOPRAZOLE SODIUM 40 MG PO TBEC
40.0000 mg | DELAYED_RELEASE_TABLET | Freq: Every day | ORAL | Status: DC
  Administered 2022-12-11: 40 mg via ORAL
  Filled 2022-12-10: qty 1

## 2022-12-10 MED ORDER — FUROSEMIDE 10 MG/ML IJ SOLN
40.0000 mg | Freq: Two times a day (BID) | INTRAMUSCULAR | Status: DC
  Administered 2022-12-11: 40 mg via INTRAVENOUS
  Filled 2022-12-10: qty 4

## 2022-12-10 MED ORDER — SENNA 8.6 MG PO TABS
1.0000 | ORAL_TABLET | Freq: Every day | ORAL | Status: DC
  Administered 2022-12-11: 8.6 mg via ORAL
  Filled 2022-12-10: qty 1

## 2022-12-10 MED ORDER — GABAPENTIN 100 MG PO CAPS: 200.0000 mg | ORAL_CAPSULE | Freq: Two times a day (BID) | ORAL | Status: DC

## 2022-12-10 MED ORDER — CYANOCOBALAMIN 500 MCG PO TABS
1000.0000 ug | ORAL_TABLET | Freq: Every day | ORAL | Status: DC
  Administered 2022-12-11: 1000 ug via ORAL
  Filled 2022-12-10: qty 2

## 2022-12-10 MED ORDER — LEVOTHYROXINE SODIUM 50 MCG PO TABS
50.0000 ug | ORAL_TABLET | Freq: Every day | ORAL | Status: DC
  Administered 2022-12-11: 50 ug via ORAL
  Filled 2022-12-10: qty 1

## 2022-12-10 MED ORDER — LINACLOTIDE 145 MCG PO CAPS
145.0000 ug | ORAL_CAPSULE | Freq: Every day | ORAL | Status: DC
  Administered 2022-12-11: 145 ug via ORAL
  Filled 2022-12-10: qty 1

## 2022-12-10 MED ORDER — VITAMIN D 25 MCG (1000 UNIT) PO TABS
1000.0000 [IU] | ORAL_TABLET | Freq: Every day | ORAL | Status: DC
  Administered 2022-12-11: 1000 [IU] via ORAL
  Filled 2022-12-10: qty 1

## 2022-12-10 MED ORDER — ACETAMINOPHEN 500 MG PO TABS
500.0000 mg | ORAL_TABLET | Freq: Four times a day (QID) | ORAL | Status: DC | PRN
  Administered 2022-12-10: 500 mg via ORAL
  Filled 2022-12-10: qty 1

## 2022-12-10 MED ORDER — LORATADINE 10 MG PO TABS
10.0000 mg | ORAL_TABLET | Freq: Every day | ORAL | Status: DC
  Administered 2022-12-11: 10 mg via ORAL
  Filled 2022-12-10: qty 1

## 2022-12-10 MED ORDER — METOPROLOL TARTRATE 5 MG/5ML IV SOLN: 5.0000 mg | Freq: Four times a day (QID) | INTRAVENOUS | Status: DC | PRN

## 2022-12-10 MED ORDER — ENOXAPARIN SODIUM 30 MG/0.3ML IJ SOSY
30.0000 mg | PREFILLED_SYRINGE | INTRAMUSCULAR | Status: DC
  Administered 2022-12-10: 30 mg via SUBCUTANEOUS
  Filled 2022-12-10: qty 0.3

## 2022-12-10 MED ORDER — FAMOTIDINE 20 MG PO TABS
20.0000 mg | ORAL_TABLET | Freq: Every day | ORAL | Status: DC
  Administered 2022-12-11: 20 mg via ORAL
  Filled 2022-12-10: qty 1

## 2022-12-10 MED ORDER — MAGNESIUM OXIDE -MG SUPPLEMENT 400 (240 MG) MG PO TABS: 400.0000 mg | ORAL_TABLET | Freq: Every day | ORAL | Status: DC

## 2022-12-10 MED ORDER — SODIUM CHLORIDE 0.9 % IV SOLN: 250.0000 mL | INTRAVENOUS | Status: DC | PRN

## 2022-12-10 MED ORDER — MAGNESIUM HYDROXIDE 400 MG/5ML PO SUSP: 5.0000 mL | Freq: Every day | ORAL | Status: DC | PRN

## 2022-12-10 MED ORDER — POLYETHYLENE GLYCOL 3350 17 G PO PACK: 17.0000 g | PACK | Freq: Every day | ORAL | Status: DC | PRN

## 2022-12-10 MED ORDER — CITALOPRAM HYDROBROMIDE 20 MG PO TABS
40.0000 mg | ORAL_TABLET | Freq: Every day | ORAL | Status: DC
  Administered 2022-12-11: 40 mg via ORAL
  Filled 2022-12-10: qty 2

## 2022-12-10 MED ORDER — GABAPENTIN 300 MG PO CAPS
300.0000 mg | ORAL_CAPSULE | Freq: Every day | ORAL | Status: DC
  Administered 2022-12-10: 300 mg via ORAL
  Filled 2022-12-10: qty 1

## 2022-12-10 MED ORDER — METOLAZONE 5 MG PO TABS
5.0000 mg | ORAL_TABLET | ORAL | Status: DC
  Administered 2022-12-11: 5 mg via ORAL
  Filled 2022-12-10: qty 1

## 2022-12-10 MED ORDER — ALPRAZOLAM 0.5 MG PO TABS: 0.2500 mg | ORAL_TABLET | Freq: Three times a day (TID) | ORAL | Status: DC | PRN

## 2022-12-10 MED ORDER — SODIUM CHLORIDE 0.9% FLUSH: 3.0000 mL | INTRAVENOUS | Status: DC | PRN

## 2022-12-10 MED ORDER — SODIUM CHLORIDE 0.9% FLUSH
3.0000 mL | Freq: Two times a day (BID) | INTRAVENOUS | Status: DC
  Administered 2022-12-10 – 2022-12-11 (×2): 3 mL via INTRAVENOUS

## 2022-12-10 MED ORDER — FUROSEMIDE 10 MG/ML IJ SOLN
40.0000 mg | Freq: Once | INTRAMUSCULAR | Status: AC
  Administered 2022-12-10: 40 mg via INTRAVENOUS
  Filled 2022-12-10: qty 4

## 2022-12-10 MED ORDER — HYDROXYCHLOROQUINE SULFATE 200 MG PO TABS
200.0000 mg | ORAL_TABLET | Freq: Every day | ORAL | Status: DC
  Administered 2022-12-11: 200 mg via ORAL
  Filled 2022-12-10 (×2): qty 1

## 2022-12-10 MED ORDER — ONDANSETRON HCL 4 MG PO TABS: 4.0000 mg | ORAL_TABLET | Freq: Four times a day (QID) | ORAL | Status: DC | PRN

## 2022-12-10 MED ORDER — FLUTICASONE FUROATE-VILANTEROL 100-25 MCG/ACT IN AEPB
1.0000 | INHALATION_SPRAY | Freq: Every day | RESPIRATORY_TRACT | Status: DC
  Administered 2022-12-11: 1 via RESPIRATORY_TRACT
  Filled 2022-12-10: qty 28

## 2022-12-10 MED ORDER — FERROUS SULFATE 325 (65 FE) MG PO TABS: 325.0000 mg | ORAL_TABLET | ORAL | Status: DC

## 2022-12-10 MED ORDER — POTASSIUM GLUCONATE 595 (99 K) MG PO TABS: 595.0000 mg | ORAL_TABLET | Freq: Two times a day (BID) | ORAL | Status: DC

## 2022-12-10 MED ORDER — ASPIRIN 81 MG PO TBEC
81.0000 mg | DELAYED_RELEASE_TABLET | Freq: Every day | ORAL | Status: DC
  Administered 2022-12-11: 81 mg via ORAL
  Filled 2022-12-10: qty 1

## 2022-12-10 MED ORDER — TRAMADOL HCL 50 MG PO TABS
50.0000 mg | ORAL_TABLET | Freq: Three times a day (TID) | ORAL | Status: DC
  Administered 2022-12-11 (×2): 50 mg via ORAL
  Filled 2022-12-10 (×2): qty 1

## 2022-12-10 MED ORDER — ONDANSETRON HCL 4 MG/2ML IJ SOLN: 4.0000 mg | Freq: Four times a day (QID) | INTRAMUSCULAR | Status: DC | PRN

## 2022-12-10 MED ORDER — PHENAZOPYRIDINE HCL 100 MG PO TABS: 95.0000 mg | ORAL_TABLET | ORAL | Status: DC | PRN

## 2022-12-10 MED ORDER — DIPHENHYDRAMINE HCL 25 MG PO CAPS
25.0000 mg | ORAL_CAPSULE | Freq: Every evening | ORAL | Status: DC | PRN
  Administered 2022-12-10: 25 mg via ORAL
  Filled 2022-12-10: qty 1

## 2022-12-10 NOTE — Assessment & Plan Note (Signed)
Continue statin

## 2022-12-10 NOTE — ED Provider Triage Note (Signed)
Emergency Medicine Provider Triage Evaluation Note  ARIELIS LEONHART , a 89 y.o. female  was evaluated in triage.  Pt complains of SOB. On 2L [place] at baseline. Reports that she has had two falls at home, 10 days ago and 2 days ago. Had gained 7 lbs this week. Meds just changed to torsemide.  Dry weight 158 and today 165  Review of Systems  Positive: SOB, weight gain Negative: CP, fever  Physical Exam  There were no vitals taken for this visit. Gen:   Awake, no distress   Resp:  Normal effort  MSK:   Moves extremities without difficulty  Other:    Medical Decision Making  Medically screening exam initiated at 1:28 PM.  Appropriate orders placed.  Kerrigan YUMALAY CIRCLE was informed that the remainder of the evaluation will be completed by another provider, this initial triage assessment does not replace that evaluation, and the importance of remaining in the ED until their evaluation is complete.     Marquette Old, PA-C 12/10/22 1331

## 2022-12-10 NOTE — Assessment & Plan Note (Signed)
Continue Xanax Continue Celexa

## 2022-12-10 NOTE — Assessment & Plan Note (Addendum)
According to the family this has been present for a while and has been evaluated by neurology.  They have no clear answer as to what her tremor is from.

## 2022-12-10 NOTE — ED Triage Notes (Signed)
Pt comes with c/o increased sob. Pt states she wears 2L  at home. Pt has had 2 falls recently with losing balance. Pt did have fall back in December and had black eye. Pt had another fall 2 days ago. Pt uses walker.   Pt has been slowing gaining weight. Pt is on lasix. Pt states chest pain., pt states it hurts a lot. Pt has labored breathing noted and accessory muscle use.

## 2022-12-10 NOTE — Assessment & Plan Note (Addendum)
Approximately 8 pounds from her dry weight.  BNP is mildly elevated at 140. IV Lasix plus home Zaroxolyn Daily weights Strict I's and O's Will have cardiology see the patient in the morning.  She reports that she is taking her torsemide as directed but this is not improving her CHF and may need her meds tweaked. There is a note that her ACE inhibitor has been discontinued although did not get a clear picture of why this was and may need to be resumed.

## 2022-12-10 NOTE — Assessment & Plan Note (Signed)
Continue antihistamine 

## 2022-12-10 NOTE — Assessment & Plan Note (Signed)
Question polypharmacy

## 2022-12-10 NOTE — Assessment & Plan Note (Addendum)
Continue home O2 Continue Breo ellipta

## 2022-12-10 NOTE — Assessment & Plan Note (Signed)
Serum creatinine appears at baseline at 1.38 Avoid nephrotoxic agents

## 2022-12-10 NOTE — Assessment & Plan Note (Signed)
-

## 2022-12-10 NOTE — Assessment & Plan Note (Signed)
Continue Celexa Has Xanax as needed for panic attacks

## 2022-12-10 NOTE — Assessment & Plan Note (Signed)
Creatinine as 1.38 and appears near baseline. Avoid nephrotoxic agents

## 2022-12-10 NOTE — Hospital Course (Signed)
Helen Ramsey is a 89 y.o. female with medical history significant of HTN, hypothyroid, HLD, depression, positive ANA, with history of HFpEF.  Presents from home complaining of shortness of breath and mild chest tightness.  Patient reported falling on Monday and then just becoming weaker and weaker.  She had recently seen her cardiologist who changed her Lasix to torsemide but she has been taking her meds as instructed.  Per her daughter-in-law her dry weight is 158 and today she weighed 166.4.  Her son came over and had lunch with her and noticed her increasing shortness of breath and brought her into the ED for evaluation.  In the emergency room patient was noted to have a negative chest x-ray, and mildly elevated BNP, and remained on her 2 L of oxygen which she is on at home and was maintaining her sats at 100%.  We were asked to admit her for improved diuresis.

## 2022-12-10 NOTE — ED Notes (Signed)
Pt repositioned and chucks replaced. Bottom noted to have dressing consistent with a small pressure injury per pt. CB in reach.

## 2022-12-10 NOTE — Progress Notes (Signed)
PHARMACIST - PHYSICIAN ORDER COMMUNICATION  CONCERNING: P&T Medication Policy on Herbal Medications  DESCRIPTION:  This patient's order(s) for: Potassium Gluconate 595 mg tablets has been noted.  This product(s) is classified as an "herbal" or natural product. Due to a lack of definitive safety studies or FDA approval, nonstandard manufacturing practices, plus the potential risk of unknown drug-drug interactions while on inpatient medications, the Pharmacy and Therapeutics Committee does not permit the use of "herbal" or natural products of this type within [facility].   ACTION TAKEN: The pharmacy department is unable to verify this order at this time.  Please reevaluate patient's clinical condition at discharge and address if the herbal or natural product(s) should be resumed at that time.  Renda Rolls, PharmD, [facility] 12/10/2022 9:40 PM

## 2022-12-10 NOTE — Assessment & Plan Note (Addendum)
8 pound weight gain over the last day despite being adherent to medications. Cardiology consult Repeat echo Continue IV Lasix and Zaroxolyn Daily weights Strict I's and O's Patient is off for ACE inhibitor but I am unclear as to why may need to continue resumption of this.

## 2022-12-10 NOTE — Assessment & Plan Note (Signed)
Last echo was greater than 1 year ago Repeat echo

## 2022-12-10 NOTE — Assessment & Plan Note (Signed)
Continue PPI

## 2022-12-10 NOTE — H&P (Signed)
History and Physical    Patient: Helen Ramsey:[id number] DOB: 1933-03-29 DOA: 12/10/2022 DOS: the patient was seen and examined on 12/10/2022 PCP: Virginia Crews, MD  Patient coming from: Home  Chief Complaint:  Chief Complaint  Patient presents with   Shortness of Breath   HPI: Helen Ramsey is a 89 y.o. female with medical history significant of HTN, hypothyroid, HLD, depression, positive ANA, with history of HFpEF.  Presents from home complaining of shortness of breath and mild chest tightness.  Patient reported falling on Monday and then just becoming weaker and weaker.  She had recently seen her cardiologist who changed her Lasix to torsemide but she has been taking her meds as instructed.  Per her daughter-in-law her dry weight is 158 and today she weighed 166.4.  Her son came over and had lunch with her and noticed her increasing shortness of breath and brought her into the ED for evaluation.  In the emergency room patient was noted to have a negative chest x-ray, and mildly elevated BNP, and remained on her 2 L of oxygen which she is on at home and was maintaining her sats at 100%.  We were asked to admit her for improved diuresis.  Review of Systems: As mentioned in the history of present illness. All other systems reviewed and are negative. Past Medical History:  Diagnosis Date   Abnormal SPEP    Allergic rhinitis, cause unspecified    Allergic rhinitis, cause unspecified    Atherosclerosis of aorta (HCC)    Back pain    lumbar   Degeneration of lumbar or lumbosacral intervertebral disc    Diverticulitis of colon (without mention of hemorrhage)(562.11)    Dizziness and giddiness    Dysthymic disorder    Headache(784.0)    Myalgia and myositis, unspecified    Osteoporosis, unspecified    Other and unspecified hyperlipidemia    Other diseases of nasal cavity and sinuses(478.19)    Other specified disorders of bladder    Syncope    Trigger finger (acquired)     Unspecified essential hypertension    Unspecified sleep apnea    Urinary frequency    Past Surgical History:  Procedure Laterality Date   ABDOMINAL HYSTERECTOMY     ADENOIDECTOMY     APPENDECTOMY     ARTHROSCOPIC KNEE SURGERY     BREAST BIOPSY Right    lymph node surgery, benign    BREAST EXCISIONAL BIOPSY     CARDIAC CATHETERIZATION     [facility]   CARPAL TUNNEL RELEASE     CATARACT EXTRACTION W/ INTRAOCULAR LENS IMPLANT & ANTERIOR VITRECTOMY, BILATERAL     CHOLECYSTECTOMY     KNEE ARTHROPLASTY Right 01/30/2016   Procedure: COMPUTER ASSISTED TOTAL KNEE ARTHROPLASTY;  Surgeon: Dereck Leep, MD;  Location: [facility] ORS;  Service: Orthopedics;  Laterality: Right;   R/O Lymph Nodes; Right Axilla     ROTATOR CUFF REPAIR Right    SPG block  03/20/2016   sphenopalatine ganglion and maxillary division of trigeminal nerve block block Dr. Manuella Ghazi   TEMPORAL ARTERY BIOPSY / LIGATION     TONSILLECTOMY     TUBAL LIGATION     Social History:  reports that she has never smoked. She has never used smokeless tobacco. She reports that she does not drink alcohol and does not use drugs.  Allergies  Allergen Reactions   Amoxicillin    Clindamycin/Lincomycin    Doxycycline    Levaquin [Levofloxacin In D5w]    Mirabegron  Nausea And Vomiting   Nsaids Other (See Comments)    GI upset   Oxycodone Nausea Only    Other reaction(s): Hallucination   Phenergan [Promethazine Hcl]    Singulair [Montelukast]    Vicodin [Hydrocodone-Acetaminophen]    Zocor [Simvastatin]    Zoloft  [Sertraline] Other (See Comments)   Ampicillin Rash   Bactrim [Sulfamethoxazole-Trimethoprim] Rash   Levofloxacin Other (See Comments)    Weakness   Penicillins Rash    Ampicillin, Clindamycin, Doxycycline Levaquin-severe headache, muscle and joint aches, weak   Tolmetin     Other reaction(s): Other (See Comments) GI upset    Family History  Problem Relation Age of Onset   Heart disease Mother    Depression Mother     Colon polyps Mother    Dementia Mother    Heart disease Father    Dementia Father    Heart disease Brother    Hypertension Brother    Heart disease Brother    Hypertension Brother    Kidney cancer Neg Hx    Bladder Cancer Neg Hx     Prior to Admission medications   Medication Sig Start Date End Date Taking? Authorizing Provider  acetaminophen (TYLENOL) 500 MG tablet Take 500 mg by mouth in the morning and at bedtime.    Yes [provider]  ALPRAZolam (XANAX) 0.25 MG tablet TAKE 1 TABLET BY MOUTH TWICE DAILY AS NEEDED FOR ANXIETY 11/20/22  Yes Bacigalupo, [name], MD  aspirin EC 81 MG tablet Take 81 mg by mouth daily.   Yes [provider]  atorvastatin (LIPITOR) 20 MG tablet Take 1 tablet (20 mg total) by mouth daily. 11/20/22  Yes Bacigalupo, [name], MD  cetirizine (ZYRTEC) 10 MG tablet 1 tablet 1 (one) time each day 11/17/21  Yes [provider]  Cholecalciferol (VITAMIN D3 PO) Take 1,000 Units by mouth.   Yes [provider]  citalopram (CELEXA) 40 MG tablet Take 1 tablet (40 mg total) by mouth daily. 11/20/22  Yes Bacigalupo, [name], MD  cyanocobalamin 1000 MCG tablet Take 1,000 mcg by mouth daily.   Yes [provider]  diphenhydrAMINE (BENADRYL) 25 MG tablet Take 25 mg by mouth at bedtime.   Yes [provider]  famotidine (PEPCID) 20 MG tablet Take 1 tablet (20 mg total) by mouth daily. 11/20/22  Yes Bacigalupo, [name], MD  ferrous sulfate 325 (65 FE) MG tablet Take by mouth.   Yes [provider]  fluticasone furoate-vilanterol (BREO ELLIPTA) 100-25 MCG/ACT AEPB Inhale 1 puff into the lungs daily. 11/20/22  Yes Bacigalupo, [name], MD  folic acid (FOLVITE) 1 MG tablet Take 1 mg by mouth daily. 04/28/22 04/28/23 Yes [provider]  gabapentin (NEURONTIN) 100 MG capsule Take 2 capsules (200 mg total) by mouth 2 (two) times daily AND 3 capsules (300 mg total) at bedtime. 11/20/22  Yes Bacigalupo, [name], MD   Galcanezumab-gnlm [facility]) Inject 300 mg as directed every 30 (thirty) days.  09/18/18  Yes [provider]  hydroxychloroquine (PLAQUENIL) 200 MG tablet Take 200 mg by mouth daily. 01/20/22  Yes [provider]  levothyroxine (SYNTHROID) 50 MCG tablet TAKE 1 TABLET(50 MCG) BY MOUTH DAILY BEFORE BREAKFAST Patient taking differently: Take 50 mcg by mouth daily before breakfast. TAKE 1 TABLET(50 MCG) BY MOUTH DAILY BEFORE BREAKFAST 11/20/22  Yes Bacigalupo, [name], MD  linaclotide [facility]) 145 MCG CAPS capsule Take 1 capsule (145 mcg total) by mouth daily before breakfast. 11/20/22  Yes Bacigalupo,  [name], MD  MAGNESIUM GLYCINATE PO Take 400 mg by mouth daily.   Yes [provider]  magnesium hydroxide (MILK OF MAGNESIA) 400 MG/5ML suspension Take 5 mLs by mouth daily as needed for mild constipation.   Yes [provider]  melatonin 5 MG TABS Take 5 mg by mouth at bedtime as needed.   Yes [provider]  methotrexate (RHEUMATREX) 2.5 MG tablet Take 10 mg by mouth once a week. 04/28/22  Yes [provider]  metolazone (ZAROXOLYN) 5 MG tablet Take 5 mg by mouth every other day. 12/02/22 05/19/23 Yes [provider]  nystatin (MYCOSTATIN/NYSTOP) powder Apply 1 Application topically 3 (three) times daily. 06/04/22  Yes Gwyneth Sprout, FNP  Omega-3 Fatty Acids (FISH OIL) 1200 MG CAPS Take 1,200 mg by mouth daily.   Yes [provider]  omeprazole (PRILOSEC) 20 MG capsule TAKE 1 CAPSULE(20 MG) BY MOUTH DAILY 11/20/22  Yes Bacigalupo, [name], MD  phenazopyridine (AZO-TABS) 95 MG tablet Take 1 tablet (95 mg total) by mouth as needed. Patient taking differently: Take 95 mg by mouth 2 (two) times daily. 05/29/16  Yes Mar Daring, PA-C  Polyethylene Glycol 3350 (MIRALAX PO) Take by mouth.   Yes [provider]  potassium gluconate 595 (99 K) MG TABS tablet Take 1 tablet (595 mg total) by mouth 2 (two) times daily. 12/27/18   Yes Mar Daring, PA-C  senna (SENOKOT) 8.6 MG tablet Take 1 tablet (8.6 mg total) by mouth daily. 09/08/22  Yes Bacigalupo, [name], MD  sodium chloride (OCEAN) 0.65 % nasal spray Place 1 spray into the nose as needed.   Yes [provider]  Torsemide 40 MG TABS Take 40 mg by mouth 2 (two) times daily. 12/04/22 12/04/23 Yes [provider]  traMADol (ULTRAM) 50 MG tablet TAKE 1 TABLET BY MOUTH TWICE DAILY AS NEEDED Patient taking differently: Take 50 mg by mouth 3 (three) times daily. TAKE 1 TABLET BY MOUTH 3 times daily 04/17/21  Yes Bacigalupo, [name], MD  triamcinolone cream (KENALOG) 0.1 % 2 weeks on and off 2 weeks 12/20/21  Yes [provider]  Dentifrices (FLUORIDE TOOTHPASTE DT) Place onto teeth daily.     [provider]  furosemide (LASIX) 80 MG tablet Take 1 tablet (80 mg total) by mouth daily. Patient not taking: Reported on 12/10/2022 11/20/22   Virginia Crews, MD  lisinopril (ZESTRIL) 5 MG tablet Take 1 tablet (5 mg total) by mouth daily. Patient not taking: Reported on 12/10/2022 11/20/22   Virginia Crews, MD    Physical Exam: Vitals:   12/10/22 1600 12/10/22 1743 12/10/22 1751 12/10/22 2130  BP: (!) 127/53  (!) 152/66   Pulse: 60  67   Resp: (!) 22  (!) 27   Temp:  98.1 F (36.7 C)    TempSrc:  Oral    SpO2: 97%  100%   Weight:    75.3 kg  Height:    5' (1.524 m)   Physical Examination: General appearance - alert, well appearing, and in no distress Chest - clear to auscultation, no wheezes, rales or rhonchi, symmetric air entry Heart - normal rate, regular rhythm, normal S1, S2, no murmurs, rubs, clicks or gallops Abdomen - soft, nontender, nondistended, no masses or organomegaly Extremities - pedal edema 1-2+  Data Reviewed: Results for orders placed or performed during the hospital encounter of 12/10/22 (from the past 24 hour(s))  Basic metabolic panel     Status:  Abnormal   Collection Time: 12/10/22  1:35 PM   Result Value Ref Range   Sodium 136 135 - 145 mmol/L   Potassium 4.0 3.5 - 5.1 mmol/L   Chloride 98 98 - 111 mmol/L   CO2 28 22 - 32 mmol/L   Glucose, Bld 95 70 - 99 mg/dL   BUN 21 8 - 23 mg/dL   Creatinine, Ser 1.38 (H) 0.44 - 1.00 mg/dL   Calcium 9.3 8.9 - 10.3 mg/dL   GFR, Estimated 37 (L) >60 mL/min   Anion gap 10 5 - 15  Brain natriuretic peptide     Status: Abnormal   Collection Time: 12/10/22  1:35 PM  Result Value Ref Range   B Natriuretic Peptide 140.7 (H) 0.0 - 100.0 pg/mL  Troponin I (High Sensitivity)     Status: None   Collection Time: 12/10/22  1:35 PM  Result Value Ref Range   Troponin I (High Sensitivity) 7 <18 ng/L  CBC with Differential     Status: Abnormal   Collection Time: 12/10/22  1:35 PM  Result Value Ref Range   WBC 5.9 4.0 - 10.5 K/uL   RBC 3.10 (L) 3.87 - 5.11 MIL/uL   Hemoglobin 10.2 (L) 12.0 - 15.0 g/dL   HCT 31.7 (L) 36.0 - 46.0 %   MCV 102.3 (H) 80.0 - 100.0 fL   MCH 32.9 26.0 - 34.0 pg   MCHC 32.2 30.0 - 36.0 g/dL   RDW 14.4 11.5 - 15.5 %   Platelets 188 150 - 400 K/uL   nRBC 0.0 0.0 - 0.2 %   Neutrophils Relative % 63 %   Neutro Abs 3.8 1.7 - 7.7 K/uL   Lymphocytes Relative 18 %   Lymphs Abs 1.1 0.7 - 4.0 K/uL   Monocytes Relative 12 %   Monocytes Absolute 0.7 0.1 - 1.0 K/uL   Eosinophils Relative 5 %   Eosinophils Absolute 0.3 0.0 - 0.5 K/uL   Basophils Relative 1 %   Basophils Absolute 0.0 0.0 - 0.1 K/uL   Immature Granulocytes 1 %   Abs Immature Granulocytes 0.05 0.00 - 0.07 K/uL  Troponin I (High Sensitivity)     Status: None   Collection Time: 12/10/22  3:32 PM  Result Value Ref Range   Troponin I (High Sensitivity) 8 <18 ng/L  Urinalysis, Routine w reflex microscopic -Urine, Clean Catch     Status: Abnormal   Collection Time: 12/10/22  9:40 PM  Result Value Ref Range   Color, Urine YELLOW (A) YELLOW   APPearance CLEAR (A) CLEAR   Specific Gravity, Urine 1.005 1.005 - 1.030   pH 8.0 5.0 - 8.0   Glucose, UA NEGATIVE  NEGATIVE mg/dL   Hgb urine dipstick NEGATIVE NEGATIVE   Bilirubin Urine NEGATIVE NEGATIVE   Ketones, ur NEGATIVE NEGATIVE mg/dL   Protein, ur NEGATIVE NEGATIVE mg/dL   Nitrite NEGATIVE NEGATIVE   Leukocytes,Ua NEGATIVE NEGATIVE   CT Chest Wo Contrast  Result Date: 12/10/2022 CLINICAL DATA:  Chest trauma.  Fall. EXAM: CT CHEST WITHOUT CONTRAST TECHNIQUE: Multidetector CT imaging of the chest was performed following the standard protocol without IV contrast. RADIATION DOSE REDUCTION: This exam was performed according to the departmental dose-optimization program which includes automated exposure control, adjustment of the mA and/or kV according to patient size and/or use of iterative reconstruction technique. COMPARISON:  CT abdomen and pelvis 08/26/2021 FINDINGS: Cardiovascular: Heart is enlarged. There is no pericardial effusion. Aorta is normal in size. There are atherosclerotic calcifications of the aorta  and coronary arteries. Mediastinum/Nodes: No enlarged mediastinal or axillary lymph nodes. Thyroid gland, trachea, and esophagus demonstrate no significant findings. Lungs/Pleura: There is a trace left pleural effusion. There is a 2 mm right upper lobe nodular density image 8/60. There is some linear atelectasis or scarring in the lingula and left lower lobe. Upper Abdomen: Peripherally calcified rounded splenic lesion measures 4 cm and appears unchanged. There is a second hypodense splenic lesion measuring 2 cm which has slightly increased in size. There are punctate right renal calculi. Musculoskeletal: There is moderate severe compression fracture of L1 which appears chronic, but is new from 2022. The bones are osteopenic. 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 IMPRESSION: 1. No acute posttraumatic sequelae in the chest. 2. Trace left pleural effusion. 3. Cardiomegaly. 4. 2 mm right solid pulmonary nodule within the upper lobe. Per Fleischner Society Guidelines, if patient is low risk for malignancy,  no routine follow-up imaging is recommended. If patient is high risk for malignancy, a non-contrast Chest CT at 12 months is optional. If performed and the nodule is stable at 12 months, no further follow-up is recommended. These guidelines do not apply to immunocompromised patients and patients with cancer. Follow up in patients with significant comorbidities as clinically warranted. For lung cancer screening, adhere to Lung-RADS guidelines. Reference: Radiology. 2017; 284(1):228-43. 5. Hypodense splenic lesion is increasing in size. Please correlate clinically. Consider further evaluation with ultrasound or MRI. Aortic Atherosclerosis (ICD10-I70.0). Electronically Signed   By: Ronney Asters M.D.   On: 12/10/2022 17:29   CT Cervical Spine Wo Contrast  Result Date: 12/10/2022 CLINICAL DATA:  Trauma EXAM: CT CERVICAL SPINE WITHOUT CONTRAST TECHNIQUE: Multidetector CT imaging of the cervical spine was performed without intravenous contrast. Multiplanar CT image reconstructions were also generated. RADIATION DOSE REDUCTION: This exam was performed according to the departmental dose-optimization program which includes automated exposure control, adjustment of the mA and/or kV according to patient size and/or use of iterative reconstruction technique. COMPARISON:  CT C Spine 08/26/21 FINDINGS: Alignment: Grade 1 anterolisthesis of C4 on C5 and C5 on C6. This is unchanged compared to 2022. Skull base and vertebrae: No acute fracture. No primary bone lesion or focal pathologic process. Soft tissues and spinal canal: No prevertebral fluid or swelling. No visible canal hematoma. There are large calcified disc bulges at the C4-C5, C5-C6, and C6-C7 levels. These are unchanged compared to prior exam. Disc levels: There is fusion of the bilateral facet joints at C2-C3. Upper chest: Negative. Other: None. IMPRESSION: 1. No acute fracture or traumatic subluxation of the cervical spine. 2. Unchanged grade 1 anterolisthesis of  C4 on C5 and C5 on C6. 3. Unchanged large calcified disc bulges at the C4-C5, C5-C6, and C6-C7 levels. Electronically Signed   By: Marin Roberts M.D.   On: 12/10/2022 17:24   CT HEAD WO CONTRAST (5MM)  Result Date: 12/10/2022 CLINICAL DATA:  Head trauma EXAM: CT HEAD WITHOUT CONTRAST TECHNIQUE: Contiguous axial images were obtained from the base of the skull through the vertex without intravenous contrast. RADIATION DOSE REDUCTION: This exam was performed according to the departmental dose-optimization program which includes automated exposure control, adjustment of the mA and/or kV according to patient size and/or use of iterative reconstruction technique. COMPARISON:  CT head 08/26/2020.  MRI brain 08/28/2021. FINDINGS: Brain: No evidence of acute infarction, hemorrhage, hydrocephalus, extra-axial collection or mass lesion/mass effect. Again seen is mild diffuse atrophy and mild periventricular white matter hypodensity, likely chronic small vessel ischemic change. Vascular: Atherosclerotic calcifications are  present within the cavernous internal carotid arteries. Skull: Normal. Negative for fracture or focal lesion. Sinuses/Orbits: No acute finding. Other: None. IMPRESSION: 1. No acute intracranial abnormality. 2. Mild diffuse atrophy and mild chronic small vessel ischemic change. Electronically Signed   By: Ronney Asters M.D.   On: 12/10/2022 17:17   DG Chest 2 View  Result Date: 12/10/2022 CLINICAL DATA:  Shortness of breath EXAM: CHEST - 2 VIEW COMPARISON:  06/10/2022, 08/26/2021 FINDINGS: The heart size and mediastinal contours are within normal limits. Aortic atherosclerosis. Low lung volumes. Mild streaky bibasilar opacities. No pleural effusion or pneumothorax. Calcified splenic cyst is unchanged. Compression fracture at approximately L1, is unchanged from prior. Exaggerated thoracic kyphosis. IMPRESSION: Low lung volumes with mild streaky bibasilar opacities, likely atelectasis. Electronically  Signed   By: Davina Poke D.O.   On: 12/10/2022 14:26     Assessment and Plan: * (HFpEF) heart failure with preserved ejection fraction (HCC) 8 pound weight gain over the last day despite being adherent to medications. Cardiology consult Repeat echo Continue IV Lasix and Zaroxolyn Daily weights Strict I's and O's Patient is off for ACE inhibitor but I am unclear as to why may need to continue resumption of this.  Chronic respiratory failure with hypoxia (HCC) Continue home O2 on 2 L Continue Breo Ellipta  Stage 3b chronic kidney disease (HCC) Creatinine as 1.38 and appears near baseline. Avoid nephrotoxic agents  ANA positive Continue Plaquenil and methotrexate  Tremor According to the family this has been present for a while and has been evaluated by neurology.  They have no clear answer as to what her tremor is from.  Acquired hypothyroidism Continue Synthroid Check TSH  Recurrent major depressive disorder, in partial remission (HCC) Continue Xanax Continue Celexa  Mixed hyperlipidemia Continue statin  Essential (primary) hypertension Continue Lopressor  Gastro-esophageal reflux disease without esophagitis Continue PPI  Allergic rhinitis Continue antihistamine        Advance Care Planning:   Code Status: Full Code discussed with patient and family  Consults: Cardiology in the morning  Family Communication: Son and daughter-in-law on the phone  Severity of Illness: The appropriate patient status for this patient is OBSERVATION. Observation status is judged to be reasonable and necessary in order to provide the required intensity of service to ensure the patient's safety. The patient's presenting symptoms, physical exam findings, and initial radiographic and laboratory data in the context of their medical condition is felt to place them at decreased risk for further clinical deterioration. Furthermore, it is anticipated that the patient will be medically  stable for discharge from the hospital within 2 midnights of admission.   Author: Donnamae Jude, MD 12/10/2022 10:38 PM  For on call review [email or web address].

## 2022-12-10 NOTE — Assessment & Plan Note (Signed)
Continue Synthroid °

## 2022-12-10 NOTE — Assessment & Plan Note (Signed)
Has seen neurology This is not new

## 2022-12-10 NOTE — Assessment & Plan Note (Signed)
Continue Plaquenil and methotrexate

## 2022-12-10 NOTE — Assessment & Plan Note (Signed)
Continue Synthroid.  Check TSH. 

## 2022-12-10 NOTE — Assessment & Plan Note (Signed)
Continue home O2 on 2 L Continue Breo Ellipta

## 2022-12-10 NOTE — ED Notes (Signed)
Pt complains of inability to urinate, ED Doc Isaac's notified, instructed to in and out cath pt.

## 2022-12-10 NOTE — Assessment & Plan Note (Signed)
Continue to gabapentin

## 2022-12-10 NOTE — Assessment & Plan Note (Signed)
BP is 152/66 On no antihypertensives at present As needed Lopressor

## 2022-12-10 NOTE — ED Provider Notes (Signed)
[facility] Provider Note    Event Date/Time   First MD Initiated Contact with Patient 12/10/22 1515     (approximate)   History   Shortness of Breath   HPI  Helen Ramsey is a 89 y.o. female  here with SOB. Pt reports that over the last 2-3 days she has had mild worsening of acute on chronic SOB. She states she just realized that she did not have torsemide but was instead taking furosemide; unsure how long she was supposed to have switched. Today while eating she became acutely more SOB and her son, an EMT, noticed her increased WOB. She also had a dull, aching chest pressure during the time. She has had some slight increase in leg swelling. Unclear if she has had weight changes. No fever or chils.       Physical Exam   Triage Vital Signs: ED Triage Vitals  Enc Vitals Group     BP 12/10/22 1332 (!) 119/104     Pulse Rate 12/10/22 1332 77     Resp 12/10/22 1332 (!) 24     Temp 12/10/22 1332 98.9 F (37.2 C)     Temp src --      SpO2 12/10/22 1331 93 %     Weight --      Height --      Head Circumference --      Peak Flow --      Pain Score 12/10/22 1332 8     Pain Loc --      Pain Edu? --      Excl. in [place]? --     Most recent vital signs: Vitals:   12/10/22 1743 12/10/22 1751  BP:  (!) 152/66  Pulse:  67  Resp:  (!) 27  Temp: 98.1 F (36.7 C)   SpO2:  100%     General: Awake, no distress.  CV:  Good peripheral perfusion. RRR. Resp:  Normal effort. Lungs with bibasilar rales, diminished aeration to both bases. Abd:  No distention. No tenderness. Other:  Bruising noted to left scapular area. 2+ edema bilateral LE.   ED Results / Procedures / Treatments   Labs (all labs ordered are listed, but only abnormal results are displayed) Labs Reviewed  BASIC METABOLIC PANEL - Abnormal; Notable for the following components:      Result Value   Creatinine, Ser 1.38 (*)    GFR, Estimated 37 (*)    All other components within normal  limits  BRAIN NATRIURETIC PEPTIDE - Abnormal; Notable for the following components:   B Natriuretic Peptide 140.7 (*)    All other components within normal limits  CBC WITH DIFFERENTIAL/PLATELET - Abnormal; Notable for the following components:   RBC 3.10 (*)    Hemoglobin 10.2 (*)    HCT 31.7 (*)    MCV 102.3 (*)    All other components within normal limits  URINALYSIS, ROUTINE W REFLEX MICROSCOPIC - Abnormal; Notable for the following components:   Color, Urine YELLOW (*)    APPearance CLEAR (*)    All other components within normal limits  BASIC METABOLIC PANEL  CBC  TROPONIN I (HIGH SENSITIVITY)  TROPONIN I (HIGH SENSITIVITY)     EKG Sinus rhythm, VR 71. PR 202, QRS 96, QTc 425. No acute ST elevations or depressions. No ischemia or infarct.   RADIOLOGY CXR: Low lung volumes w/ streaky bibasilar opacities   I also independently reviewed and agree with radiologist interpretations.  PROCEDURES:  Critical Care performed: No   MEDICATIONS ORDERED IN ED: Medications  acetaminophen (TYLENOL) tablet 500 mg (500 mg Oral Given 12/10/22 2300)  aspirin EC tablet 81 mg (81 mg Oral Not Given 12/10/22 2231)  traMADol (ULTRAM) tablet 50 mg (50 mg Oral Not Given 12/10/22 2256)  hydroxychloroquine (PLAQUENIL) tablet 200 mg (200 mg Oral Not Given 12/10/22 2252)  atorvastatin (LIPITOR) tablet 20 mg (20 mg Oral Not Given 12/10/22 2251)  metolazone (ZAROXOLYN) tablet 5 mg (has no administration in time range)  ALPRAZolam (XANAX) tablet 0.25 mg (has no administration in time range)  citalopram (CELEXA) tablet 40 mg (40 mg Oral Not Given 12/10/22 2233)  levothyroxine (SYNTHROID) tablet 50 mcg (has no administration in time range)  famotidine (PEPCID) tablet 20 mg (20 mg Oral Not Given 12/10/22 2255)  linaclotide (LINZESS) capsule 145 mcg (has no administration in time range)  magnesium hydroxide (MILK OF MAGNESIA) suspension 5 mL (has no administration in time range)  pantoprazole (PROTONIX)  EC tablet 40 mg (40 mg Oral Not Given 12/10/22 2255)  senna (SENOKOT) tablet 8.6 mg (8.6 mg Oral Not Given 12/10/22 2255)  cyanocobalamin (VITAMIN B12) tablet 1,000 mcg (1,000 mcg Oral Not Given 12/10/22 2233)  ferrous sulfate tablet 325 mg (325 mg Oral Not Given 07/11/04 3976)  folic acid (FOLVITE) tablet 1 mg (1 mg Oral Not Given 12/10/22 2235)  gabapentin (NEURONTIN) capsule 300 mg (300 mg Oral Given 12/10/22 2259)  cholecalciferol (VITAMIN D3) 25 MCG (1000 UNIT) tablet 1,000 Units (1,000 Units Oral Not Given 12/10/22 2232)  loratadine (CLARITIN) tablet 10 mg (10 mg Oral Not Given 12/10/22 2255)  diphenhydrAMINE (BENADRYL) capsule 25 mg (25 mg Oral Given 12/10/22 2259)  fluticasone furoate-vilanterol (BREO ELLIPTA) 100-25 MCG/ACT 1 puff (has no administration in time range)  furosemide (LASIX) injection 40 mg (has no administration in time range)  enoxaparin (LOVENOX) injection 30 mg (30 mg Subcutaneous Given 12/10/22 2301)  sodium chloride flush (NS) 0.9 % injection 3 mL (3 mLs Intravenous Given 12/10/22 2302)  sodium chloride flush (NS) 0.9 % injection 3 mL (has no administration in time range)  0.9 %  sodium chloride infusion (has no administration in time range)  polyethylene glycol (MIRALAX / GLYCOLAX) packet 17 g (has no administration in time range)  ondansetron (ZOFRAN) tablet 4 mg (has no administration in time range)    Or  ondansetron (ZOFRAN) injection 4 mg (has no administration in time range)  metoprolol tartrate (LOPRESSOR) injection 5 mg (has no administration in time range)  magnesium oxide (MAG-OX) tablet 400 mg (400 mg Oral Given 12/10/22 2259)  gabapentin (NEURONTIN) capsule 200 mg (has no administration in time range)  furosemide (LASIX) injection 40 mg (40 mg Intravenous Given 12/10/22 1746)     IMPRESSION / MDM / ASSESSMENT AND PLAN / ED COURSE  I reviewed the triage vital signs and the nursing notes.                              Differential diagnosis includes, but is not  limited to, CHF exacerbation, PNA, COPD exacerbation, anemia, generalized weakness, ACS  Patient's presentation is most consistent with acute presentation with potential threat to life or bodily function.  The patient is on the cardiac monitor to evaluate for evidence of arrhythmia and/or significant heart rate changes.  89 yo F here with cough, SOB, generalized weakness. Clinically, suspect acute CHF with pulmonary edema. CXR shows bibasilar opacities. CBC without leukocytosis. BNP  elevated at 140 and she has h/o dCHF. BMP with baseline mild CKD. Will send for CT given falls on coumadin, including CT chest as she had some chest wall pain after fall with bruising along right posterior back.  CT Head negative. CT chest wo acute abnormality. Admit for CHF exacerbation.  FINAL CLINICAL IMPRESSION(S) / ED DIAGNOSES   Final diagnoses:  Acute on chronic congestive heart failure, unspecified heart failure type ([place])     Rx / DC Orders   ED Discharge Orders     None        Note:  This document was prepared using Dragon voice recognition software and may include unintentional dictation errors.   Duffy Bruce, MD 12/10/22 2322

## 2022-12-10 NOTE — ED Notes (Signed)
While performing an in and out cath on pt, pt output of 875 within seconds. Foley placed on pt. Spoke with MD Kennon Rounds during her round on pt. MD pratt to put in foley catheter order for pt.

## 2022-12-11 ENCOUNTER — Observation Stay
Admit: 2022-12-11 | Discharge: 2022-12-11 | Disposition: A | Discharge status: Home / Self Care | Payer: Medicare Other | Attending: Internal Medicine | Admitting: Internal Medicine

## 2022-12-11 ENCOUNTER — Other Ambulatory Visit: Payer: Self-pay

## 2022-12-11 DIAGNOSIS — I5033 Acute on chronic diastolic (congestive) heart failure: Secondary | ICD-10-CM | POA: Diagnosis not present

## 2022-12-11 DIAGNOSIS — I130 Hypertensive heart and chronic kidney disease with heart failure and stage 1 through stage 4 chronic kidney disease, or unspecified chronic kidney disease: Secondary | ICD-10-CM | POA: Diagnosis not present

## 2022-12-11 LAB — ECHOCARDIOGRAM COMPLETE
AR max vel: 2.55 cm2
AV Area VTI: 2.77 cm2
AV Area mean vel: 2.64 cm2
AV Mean grad: 3 mmHg
AV Peak grad: 6.6 mmHg
Ao pk vel: 1.29 m/s
Area-P 1/2: 2.8 cm2
Calc EF: 58.5 %
Height: 60 in
MV M vel: 4.24 m/s
MV Peak grad: 71.8 mmHg
P 1/2 time: 579 msec
S' Lateral: 3 cm
Single Plane A2C EF: 45.6 %
Single Plane A4C EF: 64.6 %
Weight: 2656 oz

## 2022-12-11 LAB — BASIC METABOLIC PANEL
Anion gap: 9 (ref 5–15)
BUN: 27 mg/dL — ABNORMAL HIGH (ref 8–23)
CO2: 31 mmol/L (ref 22–32)
Calcium: 8.9 mg/dL (ref 8.9–10.3)
Chloride: 99 mmol/L (ref 98–111)
Creatinine, Ser: 1.47 mg/dL — ABNORMAL HIGH (ref 0.44–1.00)
GFR, Estimated: 34 mL/min — ABNORMAL LOW (ref >60)
Glucose, Bld: 95 mg/dL (ref 70–99)
Potassium: 3.5 mmol/L (ref 3.5–5.1)
Sodium: 139 mmol/L (ref 135–145)

## 2022-12-11 LAB — CBC
HCT: 28.2 % — ABNORMAL LOW (ref 36.0–46.0)
Hemoglobin: 9 g/dL — ABNORMAL LOW (ref 12.0–15.0)
MCH: 32.7 pg (ref 26.0–34.0)
MCHC: 31.9 g/dL (ref 30.0–36.0)
MCV: 102.5 fL — ABNORMAL HIGH (ref 80.0–100.0)
Platelets: 153 10*3/uL (ref 150–400)
RBC: 2.75 MIL/uL — ABNORMAL LOW (ref 3.87–5.11)
RDW: 14.3 % (ref 11.5–15.5)
WBC: 4.8 10*3/uL (ref 4.0–10.5)
nRBC: 0 % (ref 0.0–0.2)

## 2022-12-11 MED ORDER — DIPHENHYDRAMINE HCL 25 MG PO TABS: 25.0000 mg | ORAL_TABLET | Freq: Every evening | ORAL | Status: DC | PRN

## 2022-12-11 MED ORDER — PERFLUTREN LIPID MICROSPHERE
1.0000 mL | INTRAVENOUS | Status: AC | PRN
  Administered 2022-12-11: 2 mL via INTRAVENOUS

## 2022-12-11 NOTE — TOC Initial Note (Signed)
Transition of Care [facility]) - Initial/Assessment Note    Patient Details  Name: Helen Ramsey MRN: [id number] Date of Birth: 1933-03-20  Transition of Care [facility]) CM/SW Contact:    Shelbie Hutching, RN Phone Number: 12/11/2022, 3:50 PM  Clinical Narrative:                 Patient placed under observation with heart failure.  Patient is now medically cleared for discharge home with home health services.  Patient lives alone in an apartment.  Patient is on chronic oxygen 2L.  Patient walks with a rollator.  Patient is open with [facility], Sharmon Revere with [facility] notified that patient discharging today.  Patient reports that her son will pick her up and transport her home.    Expected Discharge Plan: [place] Barriers to Discharge: Barriers Resolved   Patient Goals and CMS Choice Patient states their goals for this hospitalization and ongoing recovery are:: okay to go home with home health servcies CMS Medicare.gov Compare Post Acute Care list provided to:: Patient Choice offered to / list presented to : Patient      Expected Discharge Plan and Services   Discharge Planning Services: CM Consult Post Acute Care Choice: [place] arrangements for the past 2 months: Apartment Expected Discharge Date: 12/11/22               DME Arranged: N/A         HH Arranged: RN, PT, OT, Nurse's Aide, Social Work [employer] Agency: [place] Date HH Agency Contacted: 12/11/22 Time [place]: [place] Representative spoke with at [place]: Malachy Mood  Prior Living Arrangements/Services Living arrangements for the past 2 months: [place] with:: Self Patient language and need for interpreter reviewed:: Yes Do you feel safe going back to the place where you live?: Yes      Need for Family Participation in Patient Care: Yes (Comment) Care giver support system in place?: Yes (comment) Current home services: DME (oxygen, walker) Criminal  Activity/Legal Involvement Pertinent to Current Situation/Hospitalization: No - Comment as needed  Activities of Daily Living      Permission Sought/Granted Permission sought to share information with : Case Manager, Family Supports, Other (comment) Permission granted to share information with : Yes, Verbal Permission Granted  Share Information with NAME: Jerolene Kupfer  Permission granted to share info w AGENCY: [facility]     Permission granted to share info w Contact Information: [phone]  Emotional Assessment Appearance:: Appears stated age Attitude/Demeanor/Rapport: Engaged Affect (typically observed): Accepting Orientation: : Oriented to Self, Oriented to Place, Oriented to  Time, Oriented to Situation Alcohol / Substance Use: Not Applicable Psych Involvement: No (comment)  Admission diagnosis:  CHF (congestive heart failure) (HCC) [I50.9] Patient Active Problem List   Diagnosis Date Noted   CHF (congestive heart failure) ([place]) 12/10/2022   Chronic respiratory failure with hypoxia ([place]) 12/10/2022   Panniculitis 06/04/2022   MCI (mild cognitive impairment) 06/04/2022   ANA positive 04/28/2022   Morphea 04/28/2022   Stage 3b chronic kidney disease ([place]) 04/15/2022   Pressure injury of left buttock, stage 2 ([place]) 02/14/2022   Rash and nonspecific skin eruption 12/06/2021   Tremor 09/12/2021   Acquired hypothyroidism 05/17/2021   (HFpEF) heart failure with preserved ejection fraction ([place]) 05/16/2021   History of dyspnea 01/26/2021   Recurrent major depressive disorder, in partial remission ([place]) 02/27/2020   Bilateral myofascial pain 06/01/2019   Bilateral occipital neuralgia 06/01/2019   Age-related osteoporosis  without current pathological fracture 08/16/2018   Gait instability 08/16/2018   Dorsalgia 03/31/2016   H/O total knee replacement 02/14/2016   Vitamin D deficiency 11/29/2015   Allergy to other foods 11/29/2015   Knee pain, bilateral 08/29/2015   Sleep  apnea 05/30/2015   Mixed hyperlipidemia 05/09/2015   Restless legs syndrome 05/09/2015   Allergic rhinitis 03/28/2015   Anemia 03/28/2015   Chronic headache 03/28/2015   DDD (degenerative disc disease), lumbar 03/28/2015   Gastro-esophageal reflux disease without esophagitis 03/28/2015   Essential (primary) hypertension 03/28/2015   Adaptive colitis 03/28/2015   OP (osteoporosis) 03/28/2015   Nasal septal perforation 03/28/2015   Spinal stenosis 03/28/2015   Gastric ulcer without hemorrhage or perforation 03/28/2015   Cerebral artery occlusion with cerebral infarction ([place]) 03/28/2015   Connective tissue and disc stenosis of intervertebral foramina of abdomen and other regions 03/28/2015   Diverticulosis of large intestine without perforation or abscess without bleeding 03/28/2015   Osteoarthritis 03/28/2015   Other specified functional intestinal disorders 03/28/2015   Hardening of the aorta (main artery of the heart) ([place]) 07/13/2014   H/O adenomatous polyp of colon 06/27/2005   PCP:  Virginia Crews, MD Pharmacy:   [id number] [address], [place] [address] Phone: [phone] Fax: [phone]     Social Determinants of Health (SDOH) Social History: SDOH Screenings   Food Insecurity: No Food Insecurity (07/02/2022)  Housing: Low Risk  (05/06/2022)  Transportation Needs: No Transportation Needs (07/02/2022)  Alcohol Screen: Low Risk  (11/20/2022)  Depression (PHQ2-9): Medium Risk (11/20/2022)  Financial Resource Strain: Low Risk  (05/06/2022)  Physical Activity: Insufficiently Active (05/06/2022)  Social Connections: Moderately Isolated (05/06/2022)  Stress: No Stress Concern Present (05/06/2022)  Tobacco Use: Low Risk  (12/06/2022)   SDOH Interventions:     Readmission Risk Interventions     No data to display

## 2022-12-11 NOTE — Hospital Course (Addendum)
Taken from H&P.   Helen Ramsey is a 89 y.o. female with medical history significant of HTN, hypothyroid, HLD, depression, positive ANA, with history of HFpEF.  Presents from home complaining of shortness of breath and mild chest tightness.  Patient reported falling on Monday and then just becoming weaker and weaker. She had recently seen her cardiologist who changed her Lasix to torsemide but she has been taking her meds as instructed. Per her daughter-in-law her dry weight is 158 and today she weighed 166.4. Her son came over and had lunch with her and noticed her increasing shortness of breath and brought her into the ED for evaluation.   In the emergency room patient was noted to have a negative chest x-ray, and mildly elevated BNP, and remained on her 2 L of oxygen which she is on at home and was maintaining her sats at 100%.   1/25: Hemodynamically stable.  Saturating well on baseline oxygen requirement.  Clinically appears euvolemic.  Slight increase in creatinine with IV diuresis which was discontinued.  Will  Echocardiogram with normal EF, grade 1 diastolic dysfunction and no other acute abnormality.  Patient otherwise stable for discharge and should resume recently changed to torsemide by her cardiologist.  We also ordered home health services as she lives alone and will get benefit from being physical therapy and someone looking over her.  She will continue on current medications added to have a close follow-up with her providers for further management.

## 2022-12-11 NOTE — ED Notes (Signed)
Pt ambulated with staff assistance x 1 and a walker. Pt was unable to move from sitting to standing without assistance. Pt also was unsteady while using walker. Pt states this has been a recent issue, causing a fall at home. Pt states she lives alone. MD Amin notified.

## 2022-12-11 NOTE — Discharge Summary (Signed)
Physician Discharge Summary   Patient: Helen Ramsey MRN: [id number] DOB: 08-06-1933  Admit date:     12/10/2022  Discharge date: 12/11/22  Discharge Physician: Lorella Nimrod   PCP: Virginia Crews, MD   Recommendations at discharge:  Please obtain CBC and BMP in 1 week Follow-up with cardiology Follow-up with primary care provider  Discharge Diagnoses: Principal Problem:   (HFpEF) heart failure with preserved ejection fraction ([place]) Active Problems:   Allergic rhinitis   Gastro-esophageal reflux disease without esophagitis   Essential (primary) hypertension   Mixed hyperlipidemia   Restless legs syndrome   Recurrent major depressive disorder, in partial remission ([place])   Acquired hypothyroidism   Tremor   ANA positive   Stage 3b chronic kidney disease ([place])   MCI (mild cognitive impairment)   Chronic respiratory failure with hypoxia [facility])   Hospital Course: Taken from H&P.   Helen Ramsey is a 89 y.o. female with medical history significant of HTN, hypothyroid, HLD, depression, positive ANA, with history of HFpEF.  Presents from home complaining of shortness of breath and mild chest tightness.  Patient reported falling on Monday and then just becoming weaker and weaker. She had recently seen her cardiologist who changed her Lasix to torsemide but she has been taking her meds as instructed. Per her daughter-in-law her dry weight is 158 and today she weighed 166.4. Her son came over and had lunch with her and noticed her increasing shortness of breath and brought her into the ED for evaluation.   In the emergency room patient was noted to have a negative chest x-ray, and mildly elevated BNP, and remained on her 2 L of oxygen which she is on at home and was maintaining her sats at 100%.   1/25: Hemodynamically stable.  Saturating well on baseline oxygen requirement.  Clinically appears euvolemic.  Slight increase in creatinine with IV diuresis which was discontinued.   Will  Echocardiogram with normal EF, grade 1 diastolic dysfunction and no other acute abnormality.  Patient otherwise stable for discharge and should resume recently changed to torsemide by her cardiologist.  We also ordered home health services as she lives alone and will get benefit from being physical therapy and someone looking over her.  She will continue on current medications added to have a close follow-up with her providers for further management.  Assessment and Plan: * (HFpEF) heart failure with preserved ejection fraction (HCC) 8 pound weight gain over the last day despite being adherent to medications. Cardiology consult Repeat echo Continue IV Lasix and Zaroxolyn Daily weights Strict I's and O's Patient is off for ACE inhibitor but I am unclear as to why may need to continue resumption of this.  Chronic respiratory failure with hypoxia (HCC) Continue home O2 on 2 L Continue Breo Ellipta  Stage 3b chronic kidney disease (HCC) Creatinine as 1.38 and appears near baseline. Avoid nephrotoxic agents  ANA positive Continue Plaquenil and methotrexate  Tremor According to the family this has been present for a while and has been evaluated by neurology.  They have no clear answer as to what her tremor is from.  Acquired hypothyroidism Continue Synthroid Check TSH  Recurrent major depressive disorder, in partial remission (HCC) Continue Xanax Continue Celexa  Mixed hyperlipidemia Continue statin  Essential (primary) hypertension Continue Lopressor  Gastro-esophageal reflux disease without esophagitis Continue PPI  Allergic rhinitis Continue antihistamine   Consultants: None Procedures performed: None Disposition: Home health Diet recommendation:  Discharge Diet Orders (From admission, onward)  Start     Ordered   12/11/22 0000  Diet - low sodium heart healthy        12/11/22 1502           Cardiac diet DISCHARGE MEDICATION: Allergies as  of 12/11/2022       Reactions   Amoxicillin    Clindamycin/lincomycin    Doxycycline    Levaquin [levofloxacin In D5w]    Mirabegron Nausea And Vomiting   Nsaids Other (See Comments)   GI upset   Oxycodone Nausea Only   Other reaction(s): Hallucination   Phenergan [promethazine Hcl]    Singulair [montelukast]    Vicodin [hydrocodone-acetaminophen]    Zocor [simvastatin]    Zoloft  [sertraline] Other (See Comments)   Ampicillin Rash   Bactrim [sulfamethoxazole-trimethoprim] Rash   Levofloxacin Other (See Comments)   Weakness   Penicillins Rash   Ampicillin, Clindamycin, Doxycycline Levaquin-severe headache, muscle and joint aches, weak   Tolmetin    Other reaction(s): Other (See Comments) GI upset        Medication List     STOP taking these medications    furosemide 80 MG tablet Commonly known as: LASIX   lisinopril 5 MG tablet Commonly known as: ZESTRIL       TAKE these medications    acetaminophen 500 MG tablet Commonly known as: TYLENOL Take 500 mg by mouth in the morning and at bedtime.   ALPRAZolam 0.25 MG tablet Commonly known as: XANAX TAKE 1 TABLET BY MOUTH TWICE DAILY AS NEEDED FOR ANXIETY   aspirin EC 81 MG tablet Take 81 mg by mouth daily.   atorvastatin 20 MG tablet Commonly known as: LIPITOR Take 1 tablet (20 mg total) by mouth daily.   cetirizine 10 MG tablet Commonly known as: ZYRTEC 1 tablet 1 (one) time each day   citalopram 40 MG tablet Commonly known as: CELEXA Take 1 tablet (40 mg total) by mouth daily.   cyanocobalamin 1000 MCG tablet Take 1,000 mcg by mouth daily.   diphenhydrAMINE 25 MG tablet Commonly known as: BENADRYL Take 1 tablet (25 mg total) by mouth at bedtime as needed for sleep. What changed:  when to take this reasons to take this   EMGALITY [place] Inject 300 mg as directed every 30 (thirty) days.   famotidine 20 MG tablet Commonly known as: PEPCID Take 1 tablet (20 mg total) by mouth daily.   ferrous  sulfate 325 (65 FE) MG tablet Take by mouth.   Fish Oil 1200 MG Caps Take 1,200 mg by mouth daily.   FLUORIDE TOOTHPASTE DT Place onto teeth daily.   fluticasone furoate-vilanterol 100-25 MCG/ACT Aepb Commonly known as: Breo Ellipta Inhale 1 puff into the lungs daily.   folic acid 1 MG tablet Commonly known as: FOLVITE Take 1 mg by mouth daily.   gabapentin 100 MG capsule Commonly known as: NEURONTIN Take 2 capsules (200 mg total) by mouth 2 (two) times daily AND 3 capsules (300 mg total) at bedtime.   hydroxychloroquine 200 MG tablet Commonly known as: PLAQUENIL Take 200 mg by mouth daily.   levothyroxine 50 MCG tablet Commonly known as: SYNTHROID TAKE 1 TABLET(50 MCG) BY MOUTH DAILY BEFORE BREAKFAST What changed:  how much to take how to take this when to take this   linaclotide 145 MCG Caps capsule Commonly known as: Linzess Take 1 capsule (145 mcg total) by mouth daily before breakfast.   MAGNESIUM GLYCINATE PO Take 400 mg by mouth daily.   magnesium hydroxide 400 MG/5ML  suspension Commonly known as: MILK OF MAGNESIA Take 5 mLs by mouth daily as needed for mild constipation.   melatonin 5 MG Tabs Take 5 mg by mouth at bedtime as needed.   methotrexate 2.5 MG tablet Commonly known as: RHEUMATREX Take 10 mg by mouth once a week.   metolazone 5 MG tablet Commonly known as: ZAROXOLYN Take 5 mg by mouth every other day.   MIRALAX PO Take by mouth.   nystatin powder Commonly known as: MYCOSTATIN/NYSTOP Apply 1 Application topically 3 (three) times daily.   omeprazole 20 MG capsule Commonly known as: PRILOSEC TAKE 1 CAPSULE(20 MG) BY MOUTH DAILY   phenazopyridine 95 MG tablet Commonly known as: AZO-TABS Take 1 tablet (95 mg total) by mouth as needed. What changed: when to take this   potassium gluconate 595 (99 K) MG Tabs tablet Take 1 tablet (595 mg total) by mouth 2 (two) times daily.   senna 8.6 MG tablet Commonly known as: SENOKOT Take 1  tablet (8.6 mg total) by mouth daily.   sodium chloride 0.65 % nasal spray Commonly known as: OCEAN Place 1 spray into the nose as needed.   Torsemide 40 MG Tabs Take 40 mg by mouth 2 (two) times daily.   traMADol 50 MG tablet Commonly known as: ULTRAM TAKE 1 TABLET BY MOUTH TWICE DAILY AS NEEDED What changed:  how much to take how to take this when to take this additional instructions   triamcinolone cream 0.1 % Commonly known as: KENALOG 2 weeks on and off 2 weeks   VITAMIN D3 PO Take 1,000 Units by mouth.        Follow-up Information     Yolonda Kida, MD. Go in 1 week(s).   Specialties: Cardiology, Internal Medicine Contact information: [place] [address] [phone]         Virginia Crews, MD. Schedule an appointment as soon as possible for a visit in 1 week(s).   Specialty: Family Medicine Contact information: [address] [place] [address] [phone]                Discharge Exam: Danley Danker Weights   12/10/22 2130  Weight: 75.3 kg   General.  Overweight elderly lady, in no acute distress. Pulmonary.  Lungs clear bilaterally, normal respiratory effort. CV.  Regular rate and rhythm, no JVD, rub or murmur. Abdomen.  Soft, nontender, nondistended, BS positive. CNS.  Alert and oriented .  No focal neurologic deficit. Extremities.  No edema, no cyanosis, pulses intact and symmetrical. Psychiatry.  Judgment and insight appears normal.   Condition at discharge: stable  The results of significant diagnostics from this hospitalization (including imaging, microbiology, ancillary and laboratory) are listed below for reference.   Imaging Studies: ECHOCARDIOGRAM COMPLETE  Result Date: 12/11/2022    ECHOCARDIOGRAM REPORT   Patient Name:   Helen Ramsey Date of Exam: 12/11/2022 Medical Rec #:  [id number]        Height:       60.0 in Accession #:    [id number]       Weight:       166.0 lb Date of  Birth:  04/11/1933        BSA:          1.724 m Patient Age:    45 years         BP:           133/61 mmHg Patient Gender: F  HR:           57 bpm. Exam Location:  [facility] Procedure: 2D Echo and Intracardiac Opacification Agent Indications:     CHF  History:         Patient has prior history of Echocardiogram examinations, most                  recent 03/18/2021. Risk Factors:Hypertension and Dyslipidemia.  Sonographer:     Harvie Junior Referring Phys:  [id number] Mearle Drew Diagnosing Phys: Isaias Cowman MD  Sonographer Comments: Technically difficult study due to poor echo windows. Image acquisition challenging due to patient body habitus. IMPRESSIONS  1. Left ventricular ejection fraction, by estimation, is 60 to 65%. The left ventricle has normal function. The left ventricle has no regional wall motion abnormalities. Left ventricular diastolic parameters are consistent with Grade I diastolic dysfunction (impaired relaxation).  2. Right ventricular systolic function is normal. The right ventricular size is normal.  3. The mitral valve is normal in structure. Mild mitral valve regurgitation. No evidence of mitral stenosis.  4. The aortic valve is normal in structure. Aortic valve regurgitation is mild. No aortic stenosis is present.  5. The inferior vena cava is normal in size with greater than 50% respiratory variability, suggesting right atrial pressure of 3 mmHg. FINDINGS  Left Ventricle: Left ventricular ejection fraction, by estimation, is 60 to 65%. The left ventricle has normal function. The left ventricle has no regional wall motion abnormalities. Definity contrast agent was given IV to delineate the left ventricular  endocardial borders. The left ventricular internal cavity size was normal in size. There is no left ventricular hypertrophy. Left ventricular diastolic parameters are consistent with Grade I diastolic dysfunction (impaired relaxation). Right Ventricle: The right ventricular size  is normal. No increase in right ventricular wall thickness. Right ventricular systolic function is normal. Left Atrium: Left atrial size was normal in size. Right Atrium: Right atrial size was normal in size. Pericardium: There is no evidence of pericardial effusion. Mitral Valve: The mitral valve is normal in structure. Mild mitral valve regurgitation. No evidence of mitral valve stenosis. Tricuspid Valve: The tricuspid valve is normal in structure. Tricuspid valve regurgitation is mild . No evidence of tricuspid stenosis. Aortic Valve: The aortic valve is normal in structure. Aortic valve regurgitation is mild. Aortic regurgitation PHT measures 579 msec. No aortic stenosis is present. Aortic valve mean gradient measures 3.0 mmHg. Aortic valve peak gradient measures 6.6 mmHg. Aortic valve area, by VTI measures 2.77 cm. Pulmonic Valve: The pulmonic valve was normal in structure. Pulmonic valve regurgitation is not visualized. No evidence of pulmonic stenosis. Aorta: The aortic root is normal in size and structure. Venous: The inferior vena cava is normal in size with greater than 50% respiratory variability, suggesting right atrial pressure of 3 mmHg. IAS/Shunts: No atrial level shunt detected by color flow Doppler.  LEFT VENTRICLE PLAX 2D LVIDd:         5.00 cm     Diastology LVIDs:         3.00 cm     LV e' medial:    8.27 cm/s LV PW:         1.10 cm     LV E/e' medial:  11.8 LV IVS:        1.10 cm     LV e' lateral:   7.83 cm/s LVOT diam:     1.80 cm     LV E/e' lateral: 12.5 LV SV:  80 LV SV Index:   46 LVOT Area:     2.54 cm                             3D Volume EF: LV Volumes (MOD)           3D EF:        51 % LV vol d, MOD A2C: 37.7 ml LV EDV:       144 ml LV vol d, MOD A4C: 91.2 ml LV ESV:       70 ml LV vol s, MOD A2C: 20.5 ml LV SV:        74 ml LV vol s, MOD A4C: 32.3 ml LV SV MOD A2C:     17.2 ml LV SV MOD A4C:     91.2 ml LV SV MOD BP:      36.7 ml RIGHT VENTRICLE RV Basal diam:  3.80 cm RV Mid  diam:    3.20 cm RV S prime:     15.90 cm/s TAPSE (M-mode): 1.9 cm LEFT ATRIUM           Index        RIGHT ATRIUM           Index LA diam:      3.80 cm 2.20 cm/m   RA Area:     13.50 cm LA Vol (A4C): 55.1 ml 31.95 ml/m  RA Volume:   30.10 ml  17.45 ml/m  AORTIC VALVE                    PULMONIC VALVE AV Area (Vmax):    2.55 cm     PV Vmax:       1.02 m/s AV Area (Vmean):   2.64 cm     PV Peak grad:  4.2 mmHg AV Area (VTI):     2.77 cm AV Vmax:           128.50 cm/s AV Vmean:          84.700 cm/s AV VTI:            0.289 m AV Peak Grad:      6.6 mmHg AV Mean Grad:      3.0 mmHg LVOT Vmax:         129.00 cm/s LVOT Vmean:        88.000 cm/s LVOT VTI:          0.315 m LVOT/AV VTI ratio: 1.09 AI PHT:            579 msec  AORTA Ao Root diam: 3.00 cm MITRAL VALVE MV Area (PHT): 2.80 cm     SHUNTS MV Decel Time: 271 msec     Systemic VTI:  0.32 m MR Peak grad: 71.8 mmHg     Systemic Diam: 1.80 cm MR Vmax:      423.67 cm/s MV E velocity: 97.50 cm/s MV A velocity: 116.00 cm/s MV E/A ratio:  0.84 Isaias Cowman MD Electronically signed by Isaias Cowman MD Signature Date/Time: 12/11/2022/3:01:00 PM    Final    CT Chest Wo Contrast  Result Date: 12/10/2022 CLINICAL DATA:  Chest trauma.  Fall. EXAM: CT CHEST WITHOUT CONTRAST TECHNIQUE: Multidetector CT imaging of the chest was performed following the standard protocol without IV contrast. RADIATION DOSE REDUCTION: This exam was performed according to the departmental dose-optimization program which includes automated exposure control, adjustment of the mA and/or kV  according to patient size and/or use of iterative reconstruction technique. COMPARISON:  CT abdomen and pelvis 08/26/2021 FINDINGS: Cardiovascular: Heart is enlarged. There is no pericardial effusion. Aorta is normal in size. There are atherosclerotic calcifications of the aorta and coronary arteries. Mediastinum/Nodes: No enlarged mediastinal or axillary lymph nodes. Thyroid gland, trachea, and  esophagus demonstrate no significant findings. Lungs/Pleura: There is a trace left pleural effusion. There is a 2 mm right upper lobe nodular density image 8/60. There is some linear atelectasis or scarring in the lingula and left lower lobe. Upper Abdomen: Peripherally calcified rounded splenic lesion measures 4 cm and appears unchanged. There is a second hypodense splenic lesion measuring 2 cm which has slightly increased in size. There are punctate right renal calculi. Musculoskeletal: There is moderate severe compression fracture of L1 which appears chronic, but is new from 2022. The bones are osteopenic. 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 IMPRESSION: 1. No acute posttraumatic sequelae in the chest. 2. Trace left pleural effusion. 3. Cardiomegaly. 4. 2 mm right solid pulmonary nodule within the upper lobe. Per Fleischner Society Guidelines, if patient is low risk for malignancy, no routine follow-up imaging is recommended. If patient is high risk for malignancy, a non-contrast Chest CT at 12 months is optional. If performed and the nodule is stable at 12 months, no further follow-up is recommended. These guidelines do not apply to immunocompromised patients and patients with cancer. Follow up in patients with significant comorbidities as clinically warranted. For lung cancer screening, adhere to Lung-RADS guidelines. Reference: Radiology. 2017; 284(1):228-43. 5. Hypodense splenic lesion is increasing in size. Please correlate clinically. Consider further evaluation with ultrasound or MRI. Aortic Atherosclerosis (ICD10-I70.0). Electronically Signed   By: Ronney Asters M.D.   On: 12/10/2022 17:29   CT Cervical Spine Wo Contrast  Result Date: 12/10/2022 CLINICAL DATA:  Trauma EXAM: CT CERVICAL SPINE WITHOUT CONTRAST TECHNIQUE: Multidetector CT imaging of the cervical spine was performed without intravenous contrast. Multiplanar CT image reconstructions were also generated. RADIATION DOSE REDUCTION: This exam  was performed according to the departmental dose-optimization program which includes automated exposure control, adjustment of the mA and/or kV according to patient size and/or use of iterative reconstruction technique. COMPARISON:  CT C Spine 08/26/21 FINDINGS: Alignment: Grade 1 anterolisthesis of C4 on C5 and C5 on C6. This is unchanged compared to 2022. Skull base and vertebrae: No acute fracture. No primary bone lesion or focal pathologic process. Soft tissues and spinal canal: No prevertebral fluid or swelling. No visible canal hematoma. There are large calcified disc bulges at the C4-C5, C5-C6, and C6-C7 levels. These are unchanged compared to prior exam. Disc levels: There is fusion of the bilateral facet joints at C2-C3. Upper chest: Negative. Other: None. IMPRESSION: 1. No acute fracture or traumatic subluxation of the cervical spine. 2. Unchanged grade 1 anterolisthesis of C4 on C5 and C5 on C6. 3. Unchanged large calcified disc bulges at the C4-C5, C5-C6, and C6-C7 levels. Electronically Signed   By: Marin Roberts M.D.   On: 12/10/2022 17:24   CT HEAD WO CONTRAST (5MM)  Result Date: 12/10/2022 CLINICAL DATA:  Head trauma EXAM: CT HEAD WITHOUT CONTRAST TECHNIQUE: Contiguous axial images were obtained from the base of the skull through the vertex without intravenous contrast. RADIATION DOSE REDUCTION: This exam was performed according to the departmental dose-optimization program which includes automated exposure control, adjustment of the mA and/or kV according to patient size and/or use of iterative reconstruction technique. COMPARISON:  CT head 08/26/2020.  MRI  brain 08/28/2021. FINDINGS: Brain: No evidence of acute infarction, hemorrhage, hydrocephalus, extra-axial collection or mass lesion/mass effect. Again seen is mild diffuse atrophy and mild periventricular white matter hypodensity, likely chronic small vessel ischemic change. Vascular: Atherosclerotic calcifications are present within the  cavernous internal carotid arteries. Skull: Normal. Negative for fracture or focal lesion. Sinuses/Orbits: No acute finding. Other: None. IMPRESSION: 1. No acute intracranial abnormality. 2. Mild diffuse atrophy and mild chronic small vessel ischemic change. Electronically Signed   By: Ronney Asters M.D.   On: 12/10/2022 17:17   DG Chest 2 View  Result Date: 12/10/2022 CLINICAL DATA:  Shortness of breath EXAM: CHEST - 2 VIEW COMPARISON:  06/10/2022, 08/26/2021 FINDINGS: The heart size and mediastinal contours are within normal limits. Aortic atherosclerosis. Low lung volumes. Mild streaky bibasilar opacities. No pleural effusion or pneumothorax. Calcified splenic cyst is unchanged. Compression fracture at approximately L1, is unchanged from prior. Exaggerated thoracic kyphosis. IMPRESSION: Low lung volumes with mild streaky bibasilar opacities, likely atelectasis. Electronically Signed   By: Davina Poke D.O.   On: 12/10/2022 14:26    Microbiology: Results for orders placed or performed in visit on 06/04/22  Urine Culture     Status: None   Collection Time: 06/04/22 11:13 AM   Specimen: Urine   Urine  Result Value Ref Range Status   Urine Culture, Routine Final report  Final   Organism ID, Bacteria No growth  Final    Labs: CBC: Recent Labs  Lab 12/10/22 1335 12/11/22 0543  WBC 5.9 4.8  NEUTROABS 3.8  --   HGB 10.2* 9.0*  HCT 31.7* 28.2*  MCV 102.3* 102.5*  PLT 188 229   Basic Metabolic Panel: Recent Labs  Lab 12/10/22 1335 12/11/22 0543  NA 136 139  K 4.0 3.5  CL 98 99  CO2 28 31  GLUCOSE 95 95  BUN 21 27*  CREATININE 1.38* 1.47*  CALCIUM 9.3 8.9   Liver Function Tests: No results for input(s): "AST", "ALT", "ALKPHOS", "BILITOT", "PROT", "ALBUMIN" in the last 168 hours. CBG: No results for input(s): "GLUCAP" in the last 168 hours.  Discharge time spent: greater than 30 minutes.  This record has been created using Systems analyst. Errors have  been sought and corrected,but may not always be located. Such creation errors do not reflect on the standard of care.   Signed: Lorella Nimrod, MD [facility] 12/11/2022

## 2022-12-11 NOTE — ED Notes (Signed)
Pt was able to urinate without cath.

## 2022-12-11 NOTE — Discharge Instructions (Signed)

## 2022-12-13 DIAGNOSIS — R059 Cough, unspecified: Secondary | ICD-10-CM | POA: Diagnosis not present

## 2022-12-13 DIAGNOSIS — N390 Urinary tract infection, site not specified: Secondary | ICD-10-CM | POA: Diagnosis not present

## 2022-12-13 DIAGNOSIS — Z9981 Dependence on supplemental oxygen: Secondary | ICD-10-CM | POA: Diagnosis not present

## 2022-12-13 DIAGNOSIS — I130 Hypertensive heart and chronic kidney disease with heart failure and stage 1 through stage 4 chronic kidney disease, or unspecified chronic kidney disease: Secondary | ICD-10-CM | POA: Diagnosis not present

## 2022-12-13 DIAGNOSIS — L89112 Pressure ulcer of right upper back, stage 2: Secondary | ICD-10-CM | POA: Diagnosis not present

## 2022-12-13 DIAGNOSIS — F411 Generalized anxiety disorder: Secondary | ICD-10-CM | POA: Diagnosis not present

## 2022-12-13 DIAGNOSIS — I5033 Acute on chronic diastolic (congestive) heart failure: Secondary | ICD-10-CM | POA: Diagnosis not present

## 2022-12-13 DIAGNOSIS — Z7982 Long term (current) use of aspirin: Secondary | ICD-10-CM | POA: Diagnosis not present

## 2022-12-13 DIAGNOSIS — E039 Hypothyroidism, unspecified: Secondary | ICD-10-CM | POA: Diagnosis not present

## 2022-12-13 DIAGNOSIS — J449 Chronic obstructive pulmonary disease, unspecified: Secondary | ICD-10-CM | POA: Diagnosis not present

## 2022-12-13 DIAGNOSIS — E782 Mixed hyperlipidemia: Secondary | ICD-10-CM | POA: Diagnosis not present

## 2022-12-13 DIAGNOSIS — N1832 Chronic kidney disease, stage 3b: Secondary | ICD-10-CM | POA: Diagnosis not present

## 2022-12-13 DIAGNOSIS — S41101D Unspecified open wound of right upper arm, subsequent encounter: Secondary | ICD-10-CM | POA: Diagnosis not present

## 2022-12-13 DIAGNOSIS — Z9181 History of falling: Secondary | ICD-10-CM | POA: Diagnosis not present

## 2022-12-13 DIAGNOSIS — F32A Depression, unspecified: Secondary | ICD-10-CM | POA: Diagnosis not present

## 2022-12-13 DIAGNOSIS — K219 Gastro-esophageal reflux disease without esophagitis: Secondary | ICD-10-CM | POA: Diagnosis not present

## 2022-12-16 DIAGNOSIS — I10 Essential (primary) hypertension: Secondary | ICD-10-CM | POA: Diagnosis not present

## 2022-12-16 DIAGNOSIS — R0602 Shortness of breath: Secondary | ICD-10-CM | POA: Diagnosis not present

## 2022-12-16 DIAGNOSIS — E785 Hyperlipidemia, unspecified: Secondary | ICD-10-CM | POA: Diagnosis not present

## 2022-12-16 DIAGNOSIS — I5030 Unspecified diastolic (congestive) heart failure: Secondary | ICD-10-CM | POA: Diagnosis not present

## 2022-12-16 DIAGNOSIS — N1832 Chronic kidney disease, stage 3b: Secondary | ICD-10-CM | POA: Diagnosis not present

## 2022-12-16 DIAGNOSIS — K219 Gastro-esophageal reflux disease without esophagitis: Secondary | ICD-10-CM | POA: Diagnosis not present

## 2022-12-16 DIAGNOSIS — G4733 Obstructive sleep apnea (adult) (pediatric): Secondary | ICD-10-CM | POA: Diagnosis not present

## 2022-12-16 DIAGNOSIS — R011 Cardiac murmur, unspecified: Secondary | ICD-10-CM | POA: Diagnosis not present

## 2022-12-17 DIAGNOSIS — N390 Urinary tract infection, site not specified: Secondary | ICD-10-CM | POA: Diagnosis not present

## 2022-12-17 DIAGNOSIS — I5033 Acute on chronic diastolic (congestive) heart failure: Secondary | ICD-10-CM | POA: Diagnosis not present

## 2022-12-17 DIAGNOSIS — J449 Chronic obstructive pulmonary disease, unspecified: Secondary | ICD-10-CM | POA: Diagnosis not present

## 2022-12-17 DIAGNOSIS — I130 Hypertensive heart and chronic kidney disease with heart failure and stage 1 through stage 4 chronic kidney disease, or unspecified chronic kidney disease: Secondary | ICD-10-CM | POA: Diagnosis not present

## 2022-12-17 DIAGNOSIS — L89112 Pressure ulcer of right upper back, stage 2: Secondary | ICD-10-CM | POA: Diagnosis not present

## 2022-12-17 DIAGNOSIS — N1832 Chronic kidney disease, stage 3b: Secondary | ICD-10-CM | POA: Diagnosis not present

## 2022-12-17 NOTE — Progress Notes (Unsigned)
Established patient visit   Patient: Helen Ramsey   DOB: 04-Mar-1933   89 y.o. Female  MRN: [id number] Visit Date: 12/18/2022  Today's healthcare provider: Lavon Paganini, MD   No chief complaint on file.  Subjective    HPI HPI   Hospital hospital-lung fluid-feeling  much better, left shoulder still getting little sore, constipated. Last edited by Elta Guadeloupe, CMA on 12/18/2022 10:59 AM.      Follow up Hospitalization  Patient was admitted at [facility] on 12/10/22 and discharged 12/11/22. She was treated for Acute on chronic congestive heart failure. Treatment for this included labs, x-ray Ct scan. Telephone follow up was not done  She reports excellent compliance with treatment.  Fluid is better - already had cardiology f/u appt She reports this condition is stayed the same.   Very constipated - no good BM in about a week - described as "rabbit balls and golf balls" - small and hard BMs. Using suppositories prn. Longstanding, intermittent issue. Taking linzess, 1 capful of Miralax and 3 senna each morning. Milk of Mag ----------------------------------------------------------------------------------------  Medications: Outpatient Medications Prior to Visit  Medication Sig   acetaminophen (TYLENOL) 500 MG tablet Take 500 mg by mouth in the morning and at bedtime.    ALPRAZolam (XANAX) 0.25 MG tablet TAKE 1 TABLET BY MOUTH TWICE DAILY AS NEEDED FOR ANXIETY   aspirin EC 81 MG tablet Take 81 mg by mouth daily.   atorvastatin (LIPITOR) 20 MG tablet Take 1 tablet (20 mg total) by mouth daily.   cetirizine (ZYRTEC) 10 MG tablet 1 tablet 1 (one) time each day   Cholecalciferol (VITAMIN D3 PO) Take 1,000 Units by mouth.   citalopram (CELEXA) 40 MG tablet Take 1 tablet (40 mg total) by mouth daily.   cyanocobalamin 1000 MCG tablet Take 1,000 mcg by mouth daily.   Dentifrices (FLUORIDE TOOTHPASTE DT) Place onto teeth daily.    diphenhydrAMINE (BENADRYL) 25 MG tablet Take 1  tablet (25 mg total) by mouth at bedtime as needed for sleep.   famotidine (PEPCID) 20 MG tablet Take 1 tablet (20 mg total) by mouth daily.   ferrous sulfate 325 (65 FE) MG tablet Take by mouth.   fluticasone furoate-vilanterol (BREO ELLIPTA) 100-25 MCG/ACT AEPB Inhale 1 puff into the lungs daily.   folic acid (FOLVITE) 1 MG tablet Take 1 mg by mouth daily.   gabapentin (NEURONTIN) 100 MG capsule Take 2 capsules (200 mg total) by mouth 2 (two) times daily AND 3 capsules (300 mg total) at bedtime.   Galcanezumab-gnlm (EMGALITY [place]) Inject 300 mg as directed every 30 (thirty) days.    hydroxychloroquine (PLAQUENIL) 200 MG tablet Take 200 mg by mouth daily.   levothyroxine (SYNTHROID) 50 MCG tablet TAKE 1 TABLET(50 MCG) BY MOUTH DAILY BEFORE BREAKFAST (Patient taking differently: Take 50 mcg by mouth daily before breakfast. TAKE 1 TABLET(50 MCG) BY MOUTH DAILY BEFORE BREAKFAST)   linaclotide (LINZESS) 145 MCG CAPS capsule Take 1 capsule (145 mcg total) by mouth daily before breakfast.   MAGNESIUM GLYCINATE PO Take 400 mg by mouth daily.   magnesium hydroxide (MILK OF MAGNESIA) 400 MG/5ML suspension Take 5 mLs by mouth daily as needed for mild constipation.   melatonin 5 MG TABS Take 5 mg by mouth at bedtime as needed.   methotrexate (RHEUMATREX) 2.5 MG tablet Take 10 mg by mouth once a week.   metolazone (ZAROXOLYN) 5 MG tablet Take 5 mg by mouth every other day.   nystatin (MYCOSTATIN/NYSTOP)  powder Apply 1 Application topically 3 (three) times daily.   Omega-3 Fatty Acids (FISH OIL) 1200 MG CAPS Take 1,200 mg by mouth daily.   omeprazole (PRILOSEC) 20 MG capsule TAKE 1 CAPSULE(20 MG) BY MOUTH DAILY   phenazopyridine (AZO-TABS) 95 MG tablet Take 1 tablet (95 mg total) by mouth as needed. (Patient taking differently: Take 95 mg by mouth 2 (two) times daily.)   Polyethylene Glycol 3350 (MIRALAX PO) Take by mouth.   potassium gluconate 595 (99 K) MG TABS tablet Take 1 tablet (595 mg total) by mouth 2  (two) times daily.   senna (SENOKOT) 8.6 MG tablet Take 1 tablet (8.6 mg total) by mouth daily.   sodium chloride (OCEAN) 0.65 % nasal spray Place 1 spray into the nose as needed.   Torsemide 40 MG TABS Take 40 mg by mouth 2 (two) times daily.   traMADol (ULTRAM) 50 MG tablet TAKE 1 TABLET BY MOUTH TWICE DAILY AS NEEDED (Patient taking differently: Take 50 mg by mouth 3 (three) times daily. TAKE 1 TABLET BY MOUTH 3 times daily)   triamcinolone cream (KENALOG) 0.1 % 2 weeks on and off 2 weeks   No facility-administered medications prior to visit.    Review of Systems per HPI     Objective    BP 128/74 (BP Location: Left Arm, Patient Position: Sitting, Cuff Size: Large)   Pulse (!) 54   Ht '4\' 11"'$  (1.499 m)   Wt 165 lb (74.8 kg)   SpO2 94%   BMI 33.33 kg/m    Physical Exam Vitals reviewed.  Constitutional:      General: She is not in acute distress.    Appearance: Normal appearance. She is well-developed. She is not diaphoretic.  HENT:     Head: Normocephalic and atraumatic.  Eyes:     General: No scleral icterus.    Conjunctiva/sclera: Conjunctivae normal.  Neck:     Thyroid: No thyromegaly.  Cardiovascular:     Rate and Rhythm: Normal rate and regular rhythm.     Heart sounds: Normal heart sounds.  Pulmonary:     Effort: Pulmonary effort is normal. No respiratory distress.     Breath sounds: Normal breath sounds. No wheezing, rhonchi or rales.  Musculoskeletal:     Cervical back: Neck supple.     Right lower leg: No edema.     Left lower leg: No edema.  Lymphadenopathy:     Cervical: No cervical adenopathy.  Skin:    General: Skin is warm and dry.     Findings: No rash.  Neurological:     Mental Status: She is alert and oriented to person, place, and time. Mental status is at baseline.  Psychiatric:        Mood and Affect: Mood normal.        Behavior: Behavior normal.       No results found for any visits on 12/18/22.  Assessment & Plan     Problem List  Items Addressed This Visit       Cardiovascular and Mediastinum   (HFpEF) heart failure with preserved ejection fraction (HCC) - Primary    Weight is still up a bit from baseline F/b cardiology Upcoming HF clinic appt Will monitor daily weights - discussed doing this on the same scale everyday Has orders from Cards to inc Lasix to '80mg'$  qAM and '40mg'$  qPM if still hypervolemic      Relevant Orders   CBC   Basic Metabolic Panel (BMET)  Respiratory   Chronic respiratory failure with hypoxia (HCC)    Back to baseline on home 2L O2        Digestive   Drug-induced constipation    Longstanding and uncontrolled Increase linzess to 290 mg daily If still not improving, consider GI referral Ok to continue miralax and senna Encourage slightly more fluid intake (cautiously with HFpEF) Ok to use milk of mag and suppositories prn also        Genitourinary   Stage 3b chronic kidney disease ([place])    Upcoming f/u with Nephro Repeat BMP Cr slightly up during hospitalization likely 2/2 diuresis      Relevant Orders   Basic Metabolic Panel (BMET)     Other   Anemia    Drop in hemoglobin during hospitalization, perhaps due to diuresis effect Will repeat today      Relevant Orders   CBC     No follow-ups on file.      I, Lavon Paganini, MD, have reviewed all documentation for this visit. The documentation on 12/18/22 for the exam, diagnosis, procedures, and orders are all accurate and complete.   Bacigalupo, [name], MD, MPH [facility]

## 2022-12-18 ENCOUNTER — Ambulatory Visit (INDEPENDENT_AMBULATORY_CARE_PROVIDER_SITE_OTHER): Payer: Medicare Other | Admitting: Family Medicine

## 2022-12-18 VITALS — BP 128/74 | HR 54 | Ht 59.0 in | Wt 165.0 lb

## 2022-12-18 DIAGNOSIS — D649 Anemia, unspecified: Secondary | ICD-10-CM | POA: Diagnosis not present

## 2022-12-18 DIAGNOSIS — I5032 Chronic diastolic (congestive) heart failure: Secondary | ICD-10-CM | POA: Diagnosis not present

## 2022-12-18 DIAGNOSIS — J9611 Chronic respiratory failure with hypoxia: Secondary | ICD-10-CM

## 2022-12-18 DIAGNOSIS — K5903 Drug induced constipation: Secondary | ICD-10-CM

## 2022-12-18 DIAGNOSIS — N1832 Chronic kidney disease, stage 3b: Secondary | ICD-10-CM | POA: Diagnosis not present

## 2022-12-18 MED ORDER — GLYCERIN (ADULT) 2 G RE SUPP: 1.0000 | RECTAL | 5 refills | Status: AC | PRN

## 2022-12-18 NOTE — Assessment & Plan Note (Signed)
Weight is still up a bit from baseline F/b cardiology Upcoming HF clinic appt Will monitor daily weights - discussed doing this on the same scale everyday Has orders from Cards to inc Lasix to '80mg'$  qAM and '40mg'$  qPM if still hypervolemic

## 2022-12-18 NOTE — Assessment & Plan Note (Signed)
Upcoming f/u with Nephro Repeat BMP Cr slightly up during hospitalization likely 2/2 diuresis

## 2022-12-18 NOTE — Assessment & Plan Note (Signed)
Drop in hemoglobin during hospitalization, perhaps due to diuresis effect Will repeat today

## 2022-12-18 NOTE — Progress Notes (Deleted)
   Patient ID: NATALIA WITTMEYER, female    DOB: 1933-10-29, 89 y.o.   MRN: [id number]  HPI  Ms Scheff is a 89 y/o female with a history of  Echo 12/10/22 showed EF of 60-65% along with mild MR.   Was in the ED 12/10/22 due to SOB/ chest tightness after some confusion with her diuretic. CT's were negative.   She presents today for her initial visit with a chief complaint of   Review of Systems    Physical Exam    Assessment & Plan:  1: Chronic heart failure with preserved ejection fraction without LVH/ LAE- - NYHA class - saw cardiology Clayborn Bigness) 12/16/22 - BNP 12/10/22 was 140.7  2: HTN with CKD- - BP  - saw nephrology Candiss Norse) 10/30/22 - BMP 12/11/22 showed sodium 139, potassium 3.5, creatinine 1.47 & GFR 34  3: Depression- - saw PCP Rene Kocher) 12/18/22 - TSH 11/20/22 was 3.6

## 2022-12-18 NOTE — Assessment & Plan Note (Signed)
Back to baseline on home 2L O2

## 2022-12-18 NOTE — Assessment & Plan Note (Signed)
Longstanding and uncontrolled Increase linzess to 290 mg daily If still not improving, consider GI referral Ok to continue miralax and senna Encourage slightly more fluid intake (cautiously with HFpEF) Ok to use milk of mag and suppositories prn also

## 2022-12-19 ENCOUNTER — Telehealth: Payer: Self-pay | Admitting: Family Medicine

## 2022-12-19 ENCOUNTER — Encounter: Payer: Medicare Other | Admitting: Family

## 2022-12-19 ENCOUNTER — Telehealth: Payer: Self-pay | Admitting: Family

## 2022-12-19 ENCOUNTER — Other Ambulatory Visit: Payer: Self-pay

## 2022-12-19 DIAGNOSIS — E876 Hypokalemia: Secondary | ICD-10-CM

## 2022-12-19 LAB — BASIC METABOLIC PANEL
BUN/Creatinine Ratio: 27 (ref 12–28)
BUN: 39 mg/dL — ABNORMAL HIGH (ref 8–27)
CO2: 28 mmol/L (ref 20–29)
Calcium: 9.6 mg/dL (ref 8.7–10.3)
Chloride: 93 mmol/L — ABNORMAL LOW (ref 96–106)
Creatinine, Ser: 1.43 mg/dL — ABNORMAL HIGH (ref 0.57–1.00)
Glucose: 101 mg/dL — ABNORMAL HIGH (ref 70–99)
Potassium: 2.9 mmol/L — ABNORMAL LOW (ref 3.5–5.2)
Sodium: 138 mmol/L (ref 134–144)
eGFR: 35 mL/min/{1.73_m2} — ABNORMAL LOW (ref >59)

## 2022-12-19 LAB — CBC
Hematocrit: 29.8 % — ABNORMAL LOW (ref 34.0–46.6)
Hemoglobin: 10.3 g/dL — ABNORMAL LOW (ref 11.1–15.9)
MCH: 33.3 pg — ABNORMAL HIGH (ref 26.6–33.0)
MCHC: 34.6 g/dL (ref 31.5–35.7)
MCV: 96 fL (ref 79–97)
Platelets: 238 10*3/uL (ref 150–450)
RBC: 3.09 x10E6/uL — ABNORMAL LOW (ref 3.77–5.28)
RDW: 13.1 % (ref 11.7–15.4)
WBC: 6 10*3/uL (ref 3.4–10.8)

## 2022-12-19 NOTE — Telephone Encounter (Signed)
Patient did not show for her initial Heart Failure Clinic appointment on 12/19/22. Will attempt to reschedule.

## 2022-12-19 NOTE — Telephone Encounter (Signed)
Pts daughter in law [name] is calling to ask does the patient need to be seen on Monday for a lab recheck please advise CB- [phone]

## 2022-12-19 NOTE — Telephone Encounter (Signed)
LM stating no need to be seen or appointment for labs. Just need to come in to have blood drawn.

## 2022-12-22 DIAGNOSIS — E876 Hypokalemia: Secondary | ICD-10-CM | POA: Diagnosis not present

## 2022-12-23 ENCOUNTER — Telehealth: Payer: Self-pay

## 2022-12-23 LAB — BASIC METABOLIC PANEL
BUN/Creatinine Ratio: 22 (ref 12–28)
BUN: 31 mg/dL — ABNORMAL HIGH (ref 8–27)
CO2: 29 mmol/L (ref 20–29)
Calcium: 10 mg/dL (ref 8.7–10.3)
Chloride: 93 mmol/L — ABNORMAL LOW (ref 96–106)
Creatinine, Ser: 1.39 mg/dL — ABNORMAL HIGH (ref 0.57–1.00)
Glucose: 91 mg/dL (ref 70–99)
Potassium: 3.5 mmol/L (ref 3.5–5.2)
Sodium: 138 mmol/L (ref 134–144)
eGFR: 37 mL/min/{1.73_m2} — ABNORMAL LOW (ref >59)

## 2022-12-23 NOTE — Progress Notes (Signed)
Care Management & Coordination Services Pharmacy Team Pharmacy Assistant   Name: Helen Ramsey  MRN: [id number] DOB: 1933/10/03  Reason for Encounter: Hypertension  Contacted patient to discuss hypertension disease state. Spoke with family (Daughter [name]) on 12/23/2022   Chart Updates: Recent office visits:  12/18/2022 Lavon Paganini, MD (PCP Office Visit) for CHF- Started: Glycerin 2 g prn, lab orders placed,   11/20/2022 Lavon Paganini, MD (PCP Office Visit) for HTN- Stopped: Calcium Carb, Diphenhydramine, Potassium 2.5 mEq, Galcanezumab-gnim 100 mg changed to 300 mg injecction, Changed Cholecalciferol from 25 mcg to 1000 units oral, Lab orders placed, Patient to follow-up in 6 months  Recent consult visits:  12/16/2022 Dwayne Aida Raider, MD (Cardiology) for Follow-up- No medication changes noted, No orders placed, Patient instructed to follow-up in 3 months  12/05/2022 Randa Evens, MD ( Oncology) for Abnormal SPEP- No medication changes noted, No orders placed,   12/04/2022 Dwayne Aida Raider, MD (Cardiology) for 1 week follow-up- No medication changes noted, No orders placed, Patient to follow-up in 3 months  11/27/2022 Dwayne Aida Raider, MD (Cardiology) for 2 month follow-up- Patient instructed to continue Lisinopril, Discontinue metolazone, Discontinue Lasix Start: Torsemide 40 mg twice daily for 3 days than once a day, No orders placed, No follow-up noted  10/30/2022 Murlean Iba, MD (Nephrology) for Follow-up- No medication changes noted, No orders placed, Patient instructed to follow-up in 2 months  10/08/2022 Mayur Stefan Church, MD (Rheumatology) for Follow-up- No medication changes noted, No orders placed, Patient instructed to follow-up in 4 months  09/23/2022 Loree Fee Meeler, NP (Physical Medicine) for Follow-up- No medication changes noted, No orders placed, Patient instructed to follow-up in 6 months.  09/18/2022 Dwayne Aida Raider, MD  (Cardiology) for Shortness of Breath- I am unable to view this note  09/09/2022 Murlean Iba, MD (Nephrology) for Follow-up- No medication changes noted, No orders placed, patient instructed to follow-up in 2 months  07/31/2022 Murlean Iba, MD (Nephrology) for Follow-up- Patient should continue holding Lisinopril, No orders placed, Patient instructed to follow-up in 4 weeks.  07/29/2022 Kerry Dory, PA (Neurology) for Headache- No medication changes noted, Scheduled for occipital nerve block on 08/15/2022,   07/11/2022 Shirlyn Goltz, MD (Rheumatology) for Weight loss- No medication changes noted, No orders placed, Patient instructed to follow-up in 3 months  06/24/2022 Wallene Huh, MD (Pulmonary) for Follow-up- No medication changes noted, No orders placed, patient to follow-up in 24 weeks  Hospital visits:  Medication Reconciliation was completed by comparing discharge summary, patient's EMR and Pharmacy list, and upon discussion with patient.  Admitted to the hospital on 12/10/2022 due to Shortness of Breath. Discharge date was 12/11/2022. Discharged from [facility] [place]?Medications Started at [facility] Discharge:?? -Started None ID  Medication Changes at Hospital Discharge: -Changed None ID  Medications Discontinued at Hospital Discharge: -Stopped  Furosemide 80 mg  Lisinopril 5 mg due to patient not taking  Medications that remain the same after Hospital Discharge:??  -All other medications will remain the same.    Medications: Outpatient Encounter Medications as of 12/23/2022  Medication Sig Note   acetaminophen (TYLENOL) 500 MG tablet Take 500 mg by mouth in the morning and at bedtime.     ALPRAZolam (XANAX) 0.25 MG tablet TAKE 1 TABLET BY MOUTH TWICE DAILY AS NEEDED FOR ANXIETY    aspirin EC 81 MG tablet Take 81 mg by mouth daily.    atorvastatin (LIPITOR) 20 MG tablet Take 1 tablet (20 mg total) by  mouth  daily.    cetirizine (ZYRTEC) 10 MG tablet 1 tablet 1 (one) time each day    Cholecalciferol (VITAMIN D3 PO) Take 1,000 Units by mouth.    citalopram (CELEXA) 40 MG tablet Take 1 tablet (40 mg total) by mouth daily.    cyanocobalamin 1000 MCG tablet Take 1,000 mcg by mouth daily.    Dentifrices (FLUORIDE TOOTHPASTE DT) Place onto teeth daily.     diphenhydrAMINE (BENADRYL) 25 MG tablet Take 1 tablet (25 mg total) by mouth at bedtime as needed for sleep.    famotidine (PEPCID) 20 MG tablet Take 1 tablet (20 mg total) by mouth daily.    ferrous sulfate 325 (65 FE) MG tablet Take by mouth.    fluticasone furoate-vilanterol (BREO ELLIPTA) 100-25 MCG/ACT AEPB Inhale 1 puff into the lungs daily.    folic acid (FOLVITE) 1 MG tablet Take 1 mg by mouth daily.    gabapentin (NEURONTIN) 100 MG capsule Take 2 capsules (200 mg total) by mouth 2 (two) times daily AND 3 capsules (300 mg total) at bedtime.    Galcanezumab-gnlm (EMGALITY [place]) Inject 300 mg as directed every 30 (thirty) days.     glycerin adult 2 g suppository Place 1 suppository rectally as needed for constipation.    hydroxychloroquine (PLAQUENIL) 200 MG tablet Take 200 mg by mouth daily.    levothyroxine (SYNTHROID) 50 MCG tablet TAKE 1 TABLET(50 MCG) BY MOUTH DAILY BEFORE BREAKFAST (Patient taking differently: Take 50 mcg by mouth daily before breakfast. TAKE 1 TABLET(50 MCG) BY MOUTH DAILY BEFORE BREAKFAST)    linaclotide (LINZESS) 145 MCG CAPS capsule Take 1 capsule (145 mcg total) by mouth daily before breakfast.    MAGNESIUM GLYCINATE PO Take 400 mg by mouth daily.    magnesium hydroxide (MILK OF MAGNESIA) 400 MG/5ML suspension Take 5 mLs by mouth daily as needed for mild constipation.    melatonin 5 MG TABS Take 5 mg by mouth at bedtime as needed.    methotrexate (RHEUMATREX) 2.5 MG tablet Take 10 mg by mouth once a week.    metolazone (ZAROXOLYN) 5 MG tablet Take 5 mg by mouth every other day.    nystatin (MYCOSTATIN/NYSTOP) powder  Apply 1 Application topically 3 (three) times daily.    Omega-3 Fatty Acids (FISH OIL) 1200 MG CAPS Take 1,200 mg by mouth daily.    omeprazole (PRILOSEC) 20 MG capsule TAKE 1 CAPSULE(20 MG) BY MOUTH DAILY    phenazopyridine (AZO-TABS) 95 MG tablet Take 1 tablet (95 mg total) by mouth as needed. (Patient taking differently: Take 95 mg by mouth 2 (two) times daily.) 05/16/2021: Patient reports that she takes twice a day   Polyethylene Glycol 3350 (MIRALAX PO) Take by mouth.    potassium gluconate 595 (99 K) MG TABS tablet Take 1 tablet (595 mg total) by mouth 2 (two) times daily.    senna (SENOKOT) 8.6 MG tablet Take 1 tablet (8.6 mg total) by mouth daily.    sodium chloride (OCEAN) 0.65 % nasal spray Place 1 spray into the nose as needed. 11/29/2015: Received from: [facility] 40 MG TABS Take 40 mg by mouth 2 (two) times daily.    traMADol (ULTRAM) 50 MG tablet TAKE 1 TABLET BY MOUTH TWICE DAILY AS NEEDED (Patient taking differently: Take 50 mg by mouth 3 (three) times daily. TAKE 1 TABLET BY MOUTH 3 times daily)    triamcinolone cream (KENALOG) 0.1 % 2 weeks on and off 2 weeks  No facility-administered encounter medications on file as of 12/23/2022.   Recent Office Vitals: BP Readings from Last 3 Encounters:  12/18/22 128/74  12/11/22 131/70  11/20/22 128/70   Pulse Readings from Last 3 Encounters:  12/18/22 (!) 54  12/11/22 (!) 53  11/20/22 61    Wt Readings from Last 3 Encounters:  12/18/22 165 lb (74.8 kg)  12/10/22 166 lb (75.3 kg)  11/20/22 165 lb (74.8 kg)    Kidney Function Lab Results  Component Value Date/Time   CREATININE 1.39 (H) 12/22/2022 09:45 AM   CREATININE 1.43 (H) 12/18/2022 11:30 AM   CREATININE 0.83 06/21/2014 03:40 PM   CREATININE 0.72 03/02/2014 12:18 PM   GFRNONAA 34 (L) 12/11/2022 05:43 AM   GFRNONAA >60 06/21/2014 03:40 PM   GFRAA 74 12/20/2020 11:41 AM   GFRAA >60 06/21/2014 03:40 PM      Latest Ref Rng & Units 12/22/2022     9:45 AM 12/18/2022   11:30 AM 12/11/2022    5:43 AM  BMP  Glucose 70 - 99 mg/dL 91  101  95   BUN 8 - 27 mg/dL 31  39  27   Creatinine 0.57 - 1.00 mg/dL 1.39  1.43  1.47   BUN/Creat Ratio 12 - 28 22  27    $ Sodium 134 - 144 mmol/L 138  138  139   Potassium 3.5 - 5.2 mmol/L 3.5  2.9  3.5   Chloride 96 - 106 mmol/L 93  93  99   CO2 20 - 29 mmol/L 29  28  31   $ Calcium 8.7 - 10.3 mg/dL 10.0  9.6  8.9     Current antihypertensive regimen:  None ID  Any readings above 180/100? No  What recent interventions/DTPs have been made by any provider to improve Blood Pressure control since last CPP Visit: Patient has been discontinued from Lisinopril   I spoke with the patient's daughter and she reports patient is doing much better today since her medications have been adjusted. Patient is down to 163 and her Cardiologist would like her down to 161. Per daughter patient is taking all medications as directed.   Patient does not have issues with her blood pressure and has recently been discontinued from Lisinopril. Patient was able to have a good bowel movement after taking medications.  I tried to get patient scheduled with [facility] but [name] patient's daughter requested patient be unenrolled from the program at this time. Per [name] patient has several specialist she is seeing on a regular and [name] has over 20 years of medical experience and feels patient does not need services at this time.  CPP has been notified and patient unerolled from the program as of today.   Lynann Bologna, CPA/CMA Clinical Pharmacist Assistant Phone: [phone]

## 2022-12-24 DIAGNOSIS — I5033 Acute on chronic diastolic (congestive) heart failure: Secondary | ICD-10-CM | POA: Diagnosis not present

## 2022-12-24 DIAGNOSIS — J449 Chronic obstructive pulmonary disease, unspecified: Secondary | ICD-10-CM | POA: Diagnosis not present

## 2022-12-24 DIAGNOSIS — L89112 Pressure ulcer of right upper back, stage 2: Secondary | ICD-10-CM | POA: Diagnosis not present

## 2022-12-24 DIAGNOSIS — N390 Urinary tract infection, site not specified: Secondary | ICD-10-CM | POA: Diagnosis not present

## 2022-12-24 DIAGNOSIS — I130 Hypertensive heart and chronic kidney disease with heart failure and stage 1 through stage 4 chronic kidney disease, or unspecified chronic kidney disease: Secondary | ICD-10-CM | POA: Diagnosis not present

## 2022-12-24 DIAGNOSIS — N1832 Chronic kidney disease, stage 3b: Secondary | ICD-10-CM | POA: Diagnosis not present

## 2022-12-26 DIAGNOSIS — I5033 Acute on chronic diastolic (congestive) heart failure: Secondary | ICD-10-CM | POA: Diagnosis not present

## 2022-12-26 DIAGNOSIS — L89112 Pressure ulcer of right upper back, stage 2: Secondary | ICD-10-CM | POA: Diagnosis not present

## 2022-12-26 DIAGNOSIS — J449 Chronic obstructive pulmonary disease, unspecified: Secondary | ICD-10-CM | POA: Diagnosis not present

## 2022-12-26 DIAGNOSIS — N1832 Chronic kidney disease, stage 3b: Secondary | ICD-10-CM | POA: Diagnosis not present

## 2022-12-26 DIAGNOSIS — N390 Urinary tract infection, site not specified: Secondary | ICD-10-CM | POA: Diagnosis not present

## 2022-12-26 DIAGNOSIS — I130 Hypertensive heart and chronic kidney disease with heart failure and stage 1 through stage 4 chronic kidney disease, or unspecified chronic kidney disease: Secondary | ICD-10-CM | POA: Diagnosis not present

## 2022-12-29 DIAGNOSIS — N184 Chronic kidney disease, stage 4 (severe): Secondary | ICD-10-CM | POA: Diagnosis not present

## 2022-12-29 DIAGNOSIS — R600 Localized edema: Secondary | ICD-10-CM | POA: Diagnosis not present

## 2022-12-29 DIAGNOSIS — G4733 Obstructive sleep apnea (adult) (pediatric): Secondary | ICD-10-CM | POA: Diagnosis not present

## 2022-12-29 DIAGNOSIS — I129 Hypertensive chronic kidney disease with stage 1 through stage 4 chronic kidney disease, or unspecified chronic kidney disease: Secondary | ICD-10-CM | POA: Diagnosis not present

## 2022-12-29 DIAGNOSIS — R0602 Shortness of breath: Secondary | ICD-10-CM | POA: Diagnosis not present

## 2022-12-29 DIAGNOSIS — Z9981 Dependence on supplemental oxygen: Secondary | ICD-10-CM | POA: Diagnosis not present

## 2022-12-29 DIAGNOSIS — E876 Hypokalemia: Secondary | ICD-10-CM | POA: Diagnosis not present

## 2022-12-29 DIAGNOSIS — I5030 Unspecified diastolic (congestive) heart failure: Secondary | ICD-10-CM | POA: Diagnosis not present

## 2022-12-29 DIAGNOSIS — D631 Anemia in chronic kidney disease: Secondary | ICD-10-CM | POA: Diagnosis not present

## 2022-12-30 DIAGNOSIS — I130 Hypertensive heart and chronic kidney disease with heart failure and stage 1 through stage 4 chronic kidney disease, or unspecified chronic kidney disease: Secondary | ICD-10-CM | POA: Diagnosis not present

## 2022-12-30 DIAGNOSIS — N1832 Chronic kidney disease, stage 3b: Secondary | ICD-10-CM | POA: Diagnosis not present

## 2022-12-30 DIAGNOSIS — N390 Urinary tract infection, site not specified: Secondary | ICD-10-CM | POA: Diagnosis not present

## 2022-12-30 DIAGNOSIS — J449 Chronic obstructive pulmonary disease, unspecified: Secondary | ICD-10-CM | POA: Diagnosis not present

## 2022-12-30 DIAGNOSIS — L89112 Pressure ulcer of right upper back, stage 2: Secondary | ICD-10-CM | POA: Diagnosis not present

## 2022-12-30 DIAGNOSIS — I5033 Acute on chronic diastolic (congestive) heart failure: Secondary | ICD-10-CM | POA: Diagnosis not present

## 2023-01-01 DIAGNOSIS — I129 Hypertensive chronic kidney disease with stage 1 through stage 4 chronic kidney disease, or unspecified chronic kidney disease: Secondary | ICD-10-CM | POA: Diagnosis not present

## 2023-01-01 DIAGNOSIS — R600 Localized edema: Secondary | ICD-10-CM | POA: Diagnosis not present

## 2023-01-01 DIAGNOSIS — N184 Chronic kidney disease, stage 4 (severe): Secondary | ICD-10-CM | POA: Diagnosis not present

## 2023-01-01 DIAGNOSIS — D631 Anemia in chronic kidney disease: Secondary | ICD-10-CM | POA: Diagnosis not present

## 2023-01-04 ENCOUNTER — Emergency Department: Payer: Medicare Other

## 2023-01-04 ENCOUNTER — Other Ambulatory Visit: Payer: Self-pay

## 2023-01-04 ENCOUNTER — Inpatient Hospital Stay
Admission: EM | Admit: 2023-01-04 | Discharge: 2023-01-09 | DRG: 086 | Disposition: A | Discharge status: Skilled Nursing Facility | Payer: Medicare Other | Attending: Internal Medicine | Admitting: Internal Medicine

## 2023-01-04 ENCOUNTER — Observation Stay: Payer: Medicare Other

## 2023-01-04 ENCOUNTER — Encounter: Payer: Self-pay | Admitting: *Deleted

## 2023-01-04 DIAGNOSIS — Z888 Allergy status to other drugs, medicaments and biological substances status: Secondary | ICD-10-CM

## 2023-01-04 DIAGNOSIS — I130 Hypertensive heart and chronic kidney disease with heart failure and stage 1 through stage 4 chronic kidney disease, or unspecified chronic kidney disease: Secondary | ICD-10-CM | POA: Diagnosis present

## 2023-01-04 DIAGNOSIS — Z6832 Body mass index (BMI) 32.0-32.9, adult: Secondary | ICD-10-CM | POA: Diagnosis not present

## 2023-01-04 DIAGNOSIS — M47812 Spondylosis without myelopathy or radiculopathy, cervical region: Secondary | ICD-10-CM | POA: Diagnosis not present

## 2023-01-04 DIAGNOSIS — E876 Hypokalemia: Secondary | ICD-10-CM | POA: Diagnosis present

## 2023-01-04 DIAGNOSIS — G4700 Insomnia, unspecified: Secondary | ICD-10-CM | POA: Diagnosis not present

## 2023-01-04 DIAGNOSIS — S0003XA Contusion of scalp, initial encounter: Secondary | ICD-10-CM | POA: Diagnosis present

## 2023-01-04 DIAGNOSIS — Z79899 Other long term (current) drug therapy: Secondary | ICD-10-CM

## 2023-01-04 DIAGNOSIS — M40204 Unspecified kyphosis, thoracic region: Secondary | ICD-10-CM | POA: Diagnosis not present

## 2023-01-04 DIAGNOSIS — E669 Obesity, unspecified: Secondary | ICD-10-CM | POA: Diagnosis not present

## 2023-01-04 DIAGNOSIS — M1612 Unilateral primary osteoarthritis, left hip: Secondary | ICD-10-CM | POA: Diagnosis not present

## 2023-01-04 DIAGNOSIS — Z83719 Family history of colon polyps, unspecified: Secondary | ICD-10-CM

## 2023-01-04 DIAGNOSIS — R54 Age-related physical debility: Secondary | ICD-10-CM | POA: Diagnosis present

## 2023-01-04 DIAGNOSIS — S0001XA Abrasion of scalp, initial encounter: Secondary | ICD-10-CM | POA: Diagnosis present

## 2023-01-04 DIAGNOSIS — Z7982 Long term (current) use of aspirin: Secondary | ICD-10-CM

## 2023-01-04 DIAGNOSIS — S12201A Unspecified nondisplaced fracture of third cervical vertebra, initial encounter for closed fracture: Secondary | ICD-10-CM | POA: Diagnosis not present

## 2023-01-04 DIAGNOSIS — R001 Bradycardia, unspecified: Secondary | ICD-10-CM

## 2023-01-04 DIAGNOSIS — I6200 Nontraumatic subdural hemorrhage, unspecified: Secondary | ICD-10-CM | POA: Diagnosis not present

## 2023-01-04 DIAGNOSIS — Z9981 Dependence on supplemental oxygen: Secondary | ICD-10-CM

## 2023-01-04 DIAGNOSIS — W1830XA Fall on same level, unspecified, initial encounter: Secondary | ICD-10-CM | POA: Diagnosis not present

## 2023-01-04 DIAGNOSIS — Z79890 Hormone replacement therapy (postmenopausal): Secondary | ICD-10-CM

## 2023-01-04 DIAGNOSIS — I5032 Chronic diastolic (congestive) heart failure: Secondary | ICD-10-CM | POA: Diagnosis present

## 2023-01-04 DIAGNOSIS — S065X0A Traumatic subdural hemorrhage without loss of consciousness, initial encounter: Principal | ICD-10-CM | POA: Diagnosis present

## 2023-01-04 DIAGNOSIS — M5136 Other intervertebral disc degeneration, lumbar region: Secondary | ICD-10-CM | POA: Diagnosis not present

## 2023-01-04 DIAGNOSIS — J309 Allergic rhinitis, unspecified: Secondary | ICD-10-CM | POA: Diagnosis not present

## 2023-01-04 DIAGNOSIS — M50221 Other cervical disc displacement at C4-C5 level: Secondary | ICD-10-CM | POA: Diagnosis not present

## 2023-01-04 DIAGNOSIS — M5481 Occipital neuralgia: Secondary | ICD-10-CM | POA: Diagnosis not present

## 2023-01-04 DIAGNOSIS — S12200A Unspecified displaced fracture of third cervical vertebra, initial encounter for closed fracture: Secondary | ICD-10-CM | POA: Diagnosis not present

## 2023-01-04 DIAGNOSIS — M549 Dorsalgia, unspecified: Secondary | ICD-10-CM | POA: Diagnosis present

## 2023-01-04 DIAGNOSIS — Z8249 Family history of ischemic heart disease and other diseases of the circulatory system: Secondary | ICD-10-CM

## 2023-01-04 DIAGNOSIS — R0789 Other chest pain: Secondary | ICD-10-CM | POA: Diagnosis not present

## 2023-01-04 DIAGNOSIS — Z885 Allergy status to narcotic agent status: Secondary | ICD-10-CM

## 2023-01-04 DIAGNOSIS — Z043 Encounter for examination and observation following other accident: Secondary | ICD-10-CM | POA: Diagnosis not present

## 2023-01-04 DIAGNOSIS — I700 Atherosclerosis of aorta: Secondary | ICD-10-CM | POA: Diagnosis present

## 2023-01-04 DIAGNOSIS — Z96651 Presence of right artificial knee joint: Secondary | ICD-10-CM | POA: Diagnosis not present

## 2023-01-04 DIAGNOSIS — R079 Chest pain, unspecified: Secondary | ICD-10-CM | POA: Diagnosis present

## 2023-01-04 DIAGNOSIS — E785 Hyperlipidemia, unspecified: Secondary | ICD-10-CM | POA: Diagnosis present

## 2023-01-04 DIAGNOSIS — Z9049 Acquired absence of other specified parts of digestive tract: Secondary | ICD-10-CM

## 2023-01-04 DIAGNOSIS — S22020S Wedge compression fracture of second thoracic vertebra, sequela: Secondary | ICD-10-CM | POA: Diagnosis not present

## 2023-01-04 DIAGNOSIS — J9611 Chronic respiratory failure with hypoxia: Secondary | ICD-10-CM | POA: Diagnosis present

## 2023-01-04 DIAGNOSIS — L89152 Pressure ulcer of sacral region, stage 2: Secondary | ICD-10-CM | POA: Insufficient documentation

## 2023-01-04 DIAGNOSIS — W19XXXD Unspecified fall, subsequent encounter: Secondary | ICD-10-CM | POA: Diagnosis not present

## 2023-01-04 DIAGNOSIS — N1832 Chronic kidney disease, stage 3b: Secondary | ICD-10-CM | POA: Diagnosis present

## 2023-01-04 DIAGNOSIS — W19XXXA Unspecified fall, initial encounter: Secondary | ICD-10-CM | POA: Diagnosis not present

## 2023-01-04 DIAGNOSIS — S22020A Wedge compression fracture of second thoracic vertebra, initial encounter for closed fracture: Secondary | ICD-10-CM | POA: Diagnosis present

## 2023-01-04 DIAGNOSIS — M1990 Unspecified osteoarthritis, unspecified site: Secondary | ICD-10-CM | POA: Diagnosis present

## 2023-01-04 DIAGNOSIS — S22020D Wedge compression fracture of second thoracic vertebra, subsequent encounter for fracture with routine healing: Secondary | ICD-10-CM | POA: Diagnosis not present

## 2023-01-04 DIAGNOSIS — G319 Degenerative disease of nervous system, unspecified: Secondary | ICD-10-CM | POA: Diagnosis not present

## 2023-01-04 DIAGNOSIS — M48061 Spinal stenosis, lumbar region without neurogenic claudication: Secondary | ICD-10-CM | POA: Diagnosis not present

## 2023-01-04 DIAGNOSIS — Y92481 Parking lot as the place of occurrence of the external cause: Secondary | ICD-10-CM

## 2023-01-04 DIAGNOSIS — S065XAA Traumatic subdural hemorrhage with loss of consciousness status unknown, initial encounter: Secondary | ICD-10-CM | POA: Diagnosis not present

## 2023-01-04 DIAGNOSIS — W010XXA Fall on same level from slipping, tripping and stumbling without subsequent striking against object, initial encounter: Secondary | ICD-10-CM | POA: Diagnosis present

## 2023-01-04 DIAGNOSIS — T502X5A Adverse effect of carbonic-anhydrase inhibitors, benzothiadiazides and other diuretics, initial encounter: Secondary | ICD-10-CM | POA: Diagnosis present

## 2023-01-04 DIAGNOSIS — Z90710 Acquired absence of both cervix and uterus: Secondary | ICD-10-CM

## 2023-01-04 DIAGNOSIS — Z66 Do not resuscitate: Secondary | ICD-10-CM | POA: Diagnosis present

## 2023-01-04 DIAGNOSIS — R519 Headache, unspecified: Secondary | ICD-10-CM | POA: Diagnosis present

## 2023-01-04 DIAGNOSIS — Z7401 Bed confinement status: Secondary | ICD-10-CM | POA: Diagnosis not present

## 2023-01-04 DIAGNOSIS — I5030 Unspecified diastolic (congestive) heart failure: Secondary | ICD-10-CM | POA: Diagnosis not present

## 2023-01-04 DIAGNOSIS — Z8673 Personal history of transient ischemic attack (TIA), and cerebral infarction without residual deficits: Secondary | ICD-10-CM

## 2023-01-04 DIAGNOSIS — J811 Chronic pulmonary edema: Secondary | ICD-10-CM | POA: Diagnosis not present

## 2023-01-04 DIAGNOSIS — I6782 Cerebral ischemia: Secondary | ICD-10-CM | POA: Diagnosis not present

## 2023-01-04 DIAGNOSIS — G2581 Restless legs syndrome: Secondary | ICD-10-CM | POA: Diagnosis not present

## 2023-01-04 DIAGNOSIS — Z818 Family history of other mental and behavioral disorders: Secondary | ICD-10-CM

## 2023-01-04 DIAGNOSIS — Y9301 Activity, walking, marching and hiking: Secondary | ICD-10-CM | POA: Diagnosis present

## 2023-01-04 DIAGNOSIS — E559 Vitamin D deficiency, unspecified: Secondary | ICD-10-CM | POA: Diagnosis not present

## 2023-01-04 DIAGNOSIS — Z880 Allergy status to penicillin: Secondary | ICD-10-CM

## 2023-01-04 DIAGNOSIS — R0902 Hypoxemia: Secondary | ICD-10-CM | POA: Diagnosis not present

## 2023-01-04 DIAGNOSIS — S22030A Wedge compression fracture of third thoracic vertebra, initial encounter for closed fracture: Secondary | ICD-10-CM | POA: Diagnosis not present

## 2023-01-04 DIAGNOSIS — S12200S Unspecified displaced fracture of third cervical vertebra, sequela: Secondary | ICD-10-CM | POA: Diagnosis not present

## 2023-01-04 DIAGNOSIS — S22030D Wedge compression fracture of third thoracic vertebra, subsequent encounter for fracture with routine healing: Secondary | ICD-10-CM | POA: Diagnosis not present

## 2023-01-04 DIAGNOSIS — Z7951 Long term (current) use of inhaled steroids: Secondary | ICD-10-CM

## 2023-01-04 DIAGNOSIS — K5900 Constipation, unspecified: Secondary | ICD-10-CM | POA: Diagnosis not present

## 2023-01-04 DIAGNOSIS — S065X0D Traumatic subdural hemorrhage without loss of consciousness, subsequent encounter: Secondary | ICD-10-CM | POA: Diagnosis not present

## 2023-01-04 DIAGNOSIS — R296 Repeated falls: Secondary | ICD-10-CM | POA: Diagnosis present

## 2023-01-04 DIAGNOSIS — F418 Other specified anxiety disorders: Secondary | ICD-10-CM | POA: Diagnosis not present

## 2023-01-04 DIAGNOSIS — K5190 Ulcerative colitis, unspecified, without complications: Secondary | ICD-10-CM | POA: Diagnosis not present

## 2023-01-04 DIAGNOSIS — S12291S Other nondisplaced fracture of third cervical vertebra, sequela: Secondary | ICD-10-CM | POA: Diagnosis not present

## 2023-01-04 DIAGNOSIS — S12200D Unspecified displaced fracture of third cervical vertebra, subsequent encounter for fracture with routine healing: Secondary | ICD-10-CM | POA: Diagnosis not present

## 2023-01-04 DIAGNOSIS — M810 Age-related osteoporosis without current pathological fracture: Secondary | ICD-10-CM | POA: Diagnosis not present

## 2023-01-04 DIAGNOSIS — I959 Hypotension, unspecified: Secondary | ICD-10-CM | POA: Diagnosis not present

## 2023-01-04 DIAGNOSIS — Z881 Allergy status to other antibiotic agents status: Secondary | ICD-10-CM

## 2023-01-04 DIAGNOSIS — S13150A Subluxation of C4/C5 cervical vertebrae, initial encounter: Secondary | ICD-10-CM | POA: Diagnosis not present

## 2023-01-04 LAB — CBC WITH DIFFERENTIAL/PLATELET
Abs Immature Granulocytes: 0.04 10*3/uL (ref 0.00–0.07)
Basophils Absolute: 0 10*3/uL (ref 0.0–0.1)
Basophils Relative: 0 %
Eosinophils Absolute: 0.1 10*3/uL (ref 0.0–0.5)
Eosinophils Relative: 2 %
HCT: 31.5 % — ABNORMAL LOW (ref 36.0–46.0)
Hemoglobin: 10.3 g/dL — ABNORMAL LOW (ref 12.0–15.0)
Immature Granulocytes: 1 %
Lymphocytes Relative: 9 %
Lymphs Abs: 0.8 10*3/uL (ref 0.7–4.0)
MCH: 32.3 pg (ref 26.0–34.0)
MCHC: 32.7 g/dL (ref 30.0–36.0)
MCV: 98.7 fL (ref 80.0–100.0)
Monocytes Absolute: 0.4 10*3/uL (ref 0.1–1.0)
Monocytes Relative: 5 %
Neutro Abs: 7.2 10*3/uL (ref 1.7–7.7)
Neutrophils Relative %: 83 %
Platelets: 200 10*3/uL (ref 150–400)
RBC: 3.19 MIL/uL — ABNORMAL LOW (ref 3.87–5.11)
RDW: 13.9 % (ref 11.5–15.5)
WBC: 8.6 10*3/uL (ref 4.0–10.5)
nRBC: 0 % (ref 0.0–0.2)

## 2023-01-04 LAB — BASIC METABOLIC PANEL
Anion gap: 13 (ref 5–15)
BUN: 39 mg/dL — ABNORMAL HIGH (ref 8–23)
CO2: 29 mmol/L (ref 22–32)
Calcium: 9.2 mg/dL (ref 8.9–10.3)
Chloride: 90 mmol/L — ABNORMAL LOW (ref 98–111)
Creatinine, Ser: 1.56 mg/dL — ABNORMAL HIGH (ref 0.44–1.00)
GFR, Estimated: 32 mL/min — ABNORMAL LOW (ref >60)
Glucose, Bld: 139 mg/dL — ABNORMAL HIGH (ref 70–99)
Potassium: 2.8 mmol/L — ABNORMAL LOW (ref 3.5–5.1)
Sodium: 132 mmol/L — ABNORMAL LOW (ref 135–145)

## 2023-01-04 MED ORDER — LINACLOTIDE 145 MCG PO CAPS
145.0000 ug | ORAL_CAPSULE | Freq: Every day | ORAL | Status: DC
  Administered 2023-01-05 – 2023-01-09 (×5): 145 ug via ORAL
  Filled 2023-01-04 (×5): qty 1

## 2023-01-04 MED ORDER — SENNA 8.6 MG PO TABS
1.0000 | ORAL_TABLET | Freq: Every day | ORAL | Status: DC
  Administered 2023-01-05 – 2023-01-09 (×5): 8.6 mg via ORAL
  Filled 2023-01-04 (×5): qty 1

## 2023-01-04 MED ORDER — POLYETHYLENE GLYCOL 3350 17 G PO PACK
17.0000 g | PACK | Freq: Every day | ORAL | Status: DC
  Administered 2023-01-05 – 2023-01-09 (×5): 17 g via ORAL
  Filled 2023-01-04 (×5): qty 1

## 2023-01-04 MED ORDER — TRAMADOL HCL 50 MG PO TABS
100.0000 mg | ORAL_TABLET | Freq: Once | ORAL | Status: AC
  Administered 2023-01-04: 100 mg via ORAL
  Filled 2023-01-04: qty 2

## 2023-01-04 MED ORDER — ATORVASTATIN CALCIUM 20 MG PO TABS
20.0000 mg | ORAL_TABLET | Freq: Every evening | ORAL | Status: DC
  Administered 2023-01-04 – 2023-01-08 (×5): 20 mg via ORAL
  Filled 2023-01-04 (×5): qty 1

## 2023-01-04 MED ORDER — GABAPENTIN 100 MG PO CAPS
200.0000 mg | ORAL_CAPSULE | Freq: Once | ORAL | Status: AC
  Administered 2023-01-04: 200 mg via ORAL
  Filled 2023-01-04: qty 2

## 2023-01-04 MED ORDER — TRAMADOL HCL 50 MG PO TABS: 50.0000 mg | ORAL_TABLET | Freq: Three times a day (TID) | ORAL | Status: DC

## 2023-01-04 MED ORDER — DIPHENHYDRAMINE HCL 25 MG PO TABS: 25.0000 mg | ORAL_TABLET | Freq: Every evening | ORAL | Status: DC | PRN

## 2023-01-04 MED ORDER — METOLAZONE 5 MG PO TABS
5.0000 mg | ORAL_TABLET | ORAL | Status: DC
  Administered 2023-01-05: 5 mg via ORAL
  Filled 2023-01-04: qty 1

## 2023-01-04 MED ORDER — ALBUTEROL SULFATE (2.5 MG/3ML) 0.083% IN NEBU: 2.5000 mg | INHALATION_SOLUTION | Freq: Four times a day (QID) | RESPIRATORY_TRACT | Status: DC | PRN

## 2023-01-04 MED ORDER — POLYETHYLENE GLYCOL 3350 17 GM/SCOOP PO POWD
17.0000 g | Freq: Every day | ORAL | Status: DC
  Filled 2023-01-04: qty 255

## 2023-01-04 MED ORDER — POTASSIUM CHLORIDE CRYS ER 20 MEQ PO TBCR
40.0000 meq | EXTENDED_RELEASE_TABLET | Freq: Once | ORAL | Status: AC
  Administered 2023-01-04: 40 meq via ORAL
  Filled 2023-01-04: qty 2

## 2023-01-04 MED ORDER — LEVOTHYROXINE SODIUM 50 MCG PO TABS
50.0000 ug | ORAL_TABLET | Freq: Every day | ORAL | Status: DC
  Administered 2023-01-05 – 2023-01-09 (×5): 50 ug via ORAL
  Filled 2023-01-04 (×5): qty 1

## 2023-01-04 MED ORDER — ACETAMINOPHEN 325 MG PO TABS
650.0000 mg | ORAL_TABLET | Freq: Four times a day (QID) | ORAL | Status: DC | PRN
  Administered 2023-01-05 – 2023-01-08 (×3): 650 mg via ORAL
  Filled 2023-01-04 (×3): qty 2

## 2023-01-04 MED ORDER — PHENAZOPYRIDINE HCL 100 MG PO TABS: 95.0000 mg | ORAL_TABLET | Freq: Two times a day (BID) | ORAL | Status: DC | PRN

## 2023-01-04 MED ORDER — ONDANSETRON HCL 4 MG PO TABS: 4.0000 mg | ORAL_TABLET | Freq: Four times a day (QID) | ORAL | Status: DC | PRN

## 2023-01-04 MED ORDER — SODIUM CHLORIDE 0.9 % IV BOLUS
250.0000 mL | Freq: Once | INTRAVENOUS | Status: AC
  Administered 2023-01-04: 250 mL via INTRAVENOUS

## 2023-01-04 MED ORDER — ALBUTEROL SULFATE HFA 108 (90 BASE) MCG/ACT IN AERS: 2.0000 | INHALATION_SPRAY | Freq: Four times a day (QID) | RESPIRATORY_TRACT | Status: DC | PRN

## 2023-01-04 MED ORDER — POLYETHYLENE GLYCOL 3350 17 G PO PACK
17.0000 g | PACK | Freq: Every day | ORAL | Status: DC | PRN
  Filled 2023-01-04: qty 1

## 2023-01-04 MED ORDER — ONDANSETRON HCL 4 MG/2ML IJ SOLN
4.0000 mg | Freq: Four times a day (QID) | INTRAMUSCULAR | Status: DC | PRN
  Administered 2023-01-05: 4 mg via INTRAVENOUS
  Filled 2023-01-04: qty 2

## 2023-01-04 MED ORDER — HYDROXYCHLOROQUINE SULFATE 200 MG PO TABS
200.0000 mg | ORAL_TABLET | Freq: Every day | ORAL | Status: DC
  Administered 2023-01-05 – 2023-01-09 (×5): 200 mg via ORAL
  Filled 2023-01-04 (×5): qty 1

## 2023-01-04 MED ORDER — TORSEMIDE 20 MG PO TABS
40.0000 mg | ORAL_TABLET | Freq: Once | ORAL | Status: AC
  Administered 2023-01-04: 40 mg via ORAL
  Filled 2023-01-04: qty 2

## 2023-01-04 MED ORDER — CITALOPRAM HYDROBROMIDE 10 MG PO TABS
40.0000 mg | ORAL_TABLET | Freq: Every day | ORAL | Status: DC
  Administered 2023-01-05 – 2023-01-09 (×5): 40 mg via ORAL
  Filled 2023-01-04 (×5): qty 4

## 2023-01-04 MED ORDER — TRAMADOL HCL 50 MG PO TABS
50.0000 mg | ORAL_TABLET | Freq: Three times a day (TID) | ORAL | Status: DC
  Administered 2023-01-04 – 2023-01-09 (×15): 50 mg via ORAL
  Filled 2023-01-04 (×15): qty 1

## 2023-01-04 MED ORDER — GABAPENTIN 100 MG PO CAPS
200.0000 mg | ORAL_CAPSULE | Freq: Three times a day (TID) | ORAL | Status: DC
  Administered 2023-01-04 – 2023-01-09 (×15): 200 mg via ORAL
  Filled 2023-01-04 (×15): qty 2

## 2023-01-04 MED ORDER — TORSEMIDE 20 MG PO TABS
40.0000 mg | ORAL_TABLET | Freq: Two times a day (BID) | ORAL | Status: DC
  Administered 2023-01-05 – 2023-01-09 (×9): 40 mg via ORAL
  Filled 2023-01-04 (×9): qty 2

## 2023-01-04 MED ORDER — SODIUM CHLORIDE 0.9 % IV BOLUS: 1000.0000 mL | Freq: Once | INTRAVENOUS | Status: DC

## 2023-01-04 MED ORDER — ALPRAZOLAM 0.25 MG PO TABS
0.2500 mg | ORAL_TABLET | Freq: Every day | ORAL | Status: DC | PRN
  Administered 2023-01-04 – 2023-01-09 (×3): 0.25 mg via ORAL
  Filled 2023-01-04 (×3): qty 1

## 2023-01-04 MED ORDER — ACETAMINOPHEN 650 MG RE SUPP: 650.0000 mg | Freq: Four times a day (QID) | RECTAL | Status: DC | PRN

## 2023-01-04 MED ORDER — FLUTICASONE FUROATE-VILANTEROL 100-25 MCG/ACT IN AEPB
1.0000 | INHALATION_SPRAY | Freq: Every day | RESPIRATORY_TRACT | Status: DC
  Administered 2023-01-05 – 2023-01-09 (×5): 1 via RESPIRATORY_TRACT
  Filled 2023-01-04: qty 28

## 2023-01-04 NOTE — Assessment & Plan Note (Signed)
Repeat CT scan shows a stable subdural hematoma.  Continue to monitor.

## 2023-01-04 NOTE — ED Provider Notes (Signed)
[facility] Emergency Department Provider Note     Event Date/Time   First MD Initiated Contact with Patient 01/04/23 1115     (approximate)   History   Fall   HPI  Helen Ramsey is a 89 y.o. female with a history of HFpEF CVA, HTN, DDD, chronic O2 use, presents to the ED following a mechanical fall.  [place], while trying to pick up her O2 concentrator.  Patient currently at the back of her head on the car, but denies any LOC.  She does endorse some upper back discomfort as well as some intermittent chest pain and route.  EMS reports a contusion to the back of the head as well as a normal twelve-lead EKG.  Patient is on 2 L of O2 chronically, with room O2 sats in the high 90s.   Physical Exam   Triage Vital Signs: ED Triage Vitals  Enc Vitals Group     BP 01/04/23 1122 129/64     Pulse Rate 01/04/23 1122 65     Resp 01/04/23 1122 18     Temp --      Temp src --      SpO2 01/04/23 1122 98 %     Weight 01/04/23 1125 161 lb (73 kg)     Height 01/04/23 1125 4' 11"$  (1.499 m)     Head Circumference --      Peak Flow --      Pain Score 01/04/23 1124 10     Pain Loc --      Pain Edu? --      Excl. in [place]? --     Most recent vital signs: Vitals:   01/04/23 1700 01/04/23 1800  BP: (!) 131/55 122/61  Pulse: 65 67  Resp:    SpO2: 99% 96%    General Awake, no distress. NAD HEENT NCA, except for an abrasion and some mild erythema to the crown of the scalp. PERRL. EOMI. No rhinorrhea. Mucous membranes are moist.  CV:  Good peripheral perfusion.  RESP:  Normal effort.  ABD:  No distention.  MSK:  Normal active range of motion of lower EXTR bilaterally.  No evidence of likely discrepancy or fixed external rotation. NEURO: Cranial nerves II to XII grossly intact.  ED Results / Procedures / Treatments   Labs (all labs ordered are listed, but only abnormal results are displayed) Labs Reviewed  BASIC  METABOLIC PANEL - Abnormal; Notable for the following components:      Result Value   Sodium 132 (*)    Potassium 2.8 (*)    Chloride 90 (*)    Glucose, Bld 139 (*)    BUN 39 (*)    Creatinine, Ser 1.56 (*)    GFR, Estimated 32 (*)    All other components within normal limits  CBC WITH DIFFERENTIAL/PLATELET - Abnormal; Notable for the following components:   RBC 3.19 (*)    Hemoglobin 10.3 (*)    HCT 31.5 (*)    All other components within normal limits  BASIC METABOLIC PANEL     EKG  Vent. rate 56 BPM PR interval 190 ms QRS duration 98 ms QT/QTcB 492/474 ms P-R-T axes -16 -18 54 Sinus brady  RADIOLOGY  I personally viewed and evaluated these images as part of my medical decision making, as well as reviewing the written report by the radiologist.  ED Provider Interpretation: small SDH, C3 spinous process fracture,  T3 acute compression deformity  CT Cervical Spine Wo Contrast  Result Date: 01/04/2023 CLINICAL DATA:  [place]. EXAM: CT CERVICAL SPINE WITHOUT CONTRAST TECHNIQUE: Multidetector CT imaging of the cervical spine was performed without intravenous contrast. Multiplanar CT image reconstructions were also generated. RADIATION DOSE REDUCTION: This exam was performed according to the departmental dose-optimization program which includes automated exposure control, adjustment of the mA and/or kV according to patient size and/or use of iterative reconstruction technique. COMPARISON:  12/10/2022 FINDINGS: Alignment: Stable alignment of the cervical vertebral bodies. Degenerative anterior subluxations at C4 and C5 are unchanged. Skull base and vertebrae: Advanced degenerative changes at C1-2 with pannus formation but no significant mass effect on the upper cervical cord. There is an acute fracture involving the right lamina and spinous process of C3. The pedicles are intact. No facet fractures. The facets are normally aligned. There is also a compression fracture T2. Soft tissues and  spinal canal: No prevertebral fluid or swelling. No visible canal hematoma. Disc levels:  Stable calcified disc protrusions at C4-5 and C5-6. Upper chest: The lung apices are grossly clear. Other: No neck mass or adenopathy or hematoma. Bilateral carotid artery calcifications. IMPRESSION: 1. Acute fracture involving the right lamina and spinous process of C3. 2. Compression fracture of T2. 3. Stable degenerative anterior subluxations at C4 and C5. 4. Advanced degenerative changes at C1-2 with pannus formation but no significant mass effect on the upper cervical cord. 5. Stable calcified disc protrusions at C4-5 and C5-6. Electronically Signed   By: Marijo Sanes M.D.   On: 01/04/2023 14:33   CT Thoracic Spine Wo Contrast  Result Date: 01/04/2023 CLINICAL DATA:  [address] in parking lot. EXAM: CT THORACIC AND LUMBAR SPINE WITHOUT CONTRAST TECHNIQUE: Multidetector CT imaging of the thoracic and lumbar spine was performed without contrast. Multiplanar CT image reconstructions were also generated. RADIATION DOSE REDUCTION: This exam was performed according to the departmental dose-optimization program which includes automated exposure control, adjustment of the mA and/or kV according to patient size and/or use of iterative reconstruction technique. COMPARISON:  Chest CT 12/10/2022 FINDINGS: CT THORACIC SPINE FINDINGS Alignment: Stable thoracic scoliosis and exaggerated thoracic kyphosis. Normal alignment in the sagittal plane. Vertebrae: There is a T2 compression fracture which was not present on the prior chest CT. The L1 compression fracture is chronic. No other thoracic compression fractures are identified. There is significant osteoporosis and changes of DISH. Paraspinal and other soft tissues: No significant paraspinal, posterior mediastinal or posterior long abnormality. A large calcified splenic cyst is noted. Advanced aortic calcifications but no aneurysm. Disc levels: The thoracic spinal canal is generous. No  significant canal stenosis. No retropulsion at T2. CT LUMBAR SPINE FINDINGS Segmentation: Transitional lumbar anatomy noted with partial lumbarization of S1. Alignment: Degenerative anterolisthesis of L5. Vertebrae: Remote L1 compression fracture with mild retropulsion of the posterosuperior aspect of vertebral body. Advanced osteoporosis but no other fractures are identified. Advanced multilevel facet disease but no definite pars defects or fractures. Paraspinal and other soft tissues: No significant paraspinal or retroperitoneal findings. Advanced aortic calcifications but no aneurysm. Disc levels: T12-L1: Retropulsion of L1 with flattening of the ventral thecal sac but no significant canal stenosis. L1-2: No significant findings. L2-3: No significant findings. L3-4: Bulging annulus and advanced facet disease with ligamentum flavum thickening contributing to mild spinal and bilateral lateral recess stenosis. There is also mild right foraminal stenosis. L4-5: Diffuse bulging uncovered disc, advanced facet disease and ligamentum flavum thickening contributing to moderate spinal and bilateral lateral recess stenosis. No  significant foraminal stenosis. L5-S1: Bulging uncovered disc and severe facet disease with moderate spinal and bilateral lateral recess stenosis and mild bilateral foraminal stenosis. Small disc space at S1-2 but no significant findings. IMPRESSION: 1. New/acute T2 compression fracture. No significant retropulsion or canal compromise. 2. Chronic L1 compression fracture. 3. Advanced osteoporosis but no other fractures are identified. 4. Moderate spinal and bilateral lateral recess stenosis at L4-5 and L5-S1. 5. Mild spinal and bilateral lateral recess stenosis at L3-4. There is also mild right foraminal stenosis at this level. Aortic Atherosclerosis (ICD10-I70.0). Electronically Signed   By: Marijo Sanes M.D.   On: 01/04/2023 14:24   CT Lumbar Spine Wo Contrast  Result Date: 01/04/2023 CLINICAL  DATA:  [address] in parking lot. EXAM: CT THORACIC AND LUMBAR SPINE WITHOUT CONTRAST TECHNIQUE: Multidetector CT imaging of the thoracic and lumbar spine was performed without contrast. Multiplanar CT image reconstructions were also generated. RADIATION DOSE REDUCTION: This exam was performed according to the departmental dose-optimization program which includes automated exposure control, adjustment of the mA and/or kV according to patient size and/or use of iterative reconstruction technique. COMPARISON:  Chest CT 12/10/2022 FINDINGS: CT THORACIC SPINE FINDINGS Alignment: Stable thoracic scoliosis and exaggerated thoracic kyphosis. Normal alignment in the sagittal plane. Vertebrae: There is a T2 compression fracture which was not present on the prior chest CT. The L1 compression fracture is chronic. No other thoracic compression fractures are identified. There is significant osteoporosis and changes of DISH. Paraspinal and other soft tissues: No significant paraspinal, posterior mediastinal or posterior long abnormality. A large calcified splenic cyst is noted. Advanced aortic calcifications but no aneurysm. Disc levels: The thoracic spinal canal is generous. No significant canal stenosis. No retropulsion at T2. CT LUMBAR SPINE FINDINGS Segmentation: Transitional lumbar anatomy noted with partial lumbarization of S1. Alignment: Degenerative anterolisthesis of L5. Vertebrae: Remote L1 compression fracture with mild retropulsion of the posterosuperior aspect of vertebral body. Advanced osteoporosis but no other fractures are identified. Advanced multilevel facet disease but no definite pars defects or fractures. Paraspinal and other soft tissues: No significant paraspinal or retroperitoneal findings. Advanced aortic calcifications but no aneurysm. Disc levels: T12-L1: Retropulsion of L1 with flattening of the ventral thecal sac but no significant canal stenosis. L1-2: No significant findings. L2-3: No significant  findings. L3-4: Bulging annulus and advanced facet disease with ligamentum flavum thickening contributing to mild spinal and bilateral lateral recess stenosis. There is also mild right foraminal stenosis. L4-5: Diffuse bulging uncovered disc, advanced facet disease and ligamentum flavum thickening contributing to moderate spinal and bilateral lateral recess stenosis. No significant foraminal stenosis. L5-S1: Bulging uncovered disc and severe facet disease with moderate spinal and bilateral lateral recess stenosis and mild bilateral foraminal stenosis. Small disc space at S1-2 but no significant findings. IMPRESSION: 1. New/acute T2 compression fracture. No significant retropulsion or canal compromise. 2. Chronic L1 compression fracture. 3. Advanced osteoporosis but no other fractures are identified. 4. Moderate spinal and bilateral lateral recess stenosis at L4-5 and L5-S1. 5. Mild spinal and bilateral lateral recess stenosis at L3-4. There is also mild right foraminal stenosis at this level. Aortic Atherosclerosis (ICD10-I70.0). Electronically Signed   By: Marijo Sanes M.D.   On: 01/04/2023 14:24   CT HEAD WO CONTRAST (5MM)  Result Date: 01/04/2023 CLINICAL DATA:  Provided history: Head trauma, minor. EXAM: CT HEAD WITHOUT CONTRAST TECHNIQUE: Contiguous axial images were obtained from the base of the skull through the vertex without intravenous contrast. RADIATION DOSE REDUCTION: This exam was performed according to the  departmental dose-optimization program which includes automated exposure control, adjustment of the mA and/or kV according to patient size and/or use of iterative reconstruction technique. COMPARISON:  Head CT 12/10/2022. FINDINGS: Brain: Mild generalized parenchymal atrophy. Acute subdural hematoma along the left aspect of the falx, measuring up to 4 mm in thickness. Mild-to-moderate patchy and ill-defined hypoattenuation within the cerebral white matter, nonspecific but compatible with  chronic small vessel disease. No demarcated cortical infarct. No evidence of an intracranial mass. No midline shift. Vascular: No hyperdense vessel.  Atherosclerotic calcifications. Skull: No fracture or aggressive osseous lesion. Sinuses/Orbits: No mass or acute finding within the imaged orbits. No significant paranasal sinus disease at the imaged levels. Impression #1 called by telephone at the time of interpretation on 01/04/2023 at 2:20 pm to provider Juliahna Wiswell , who verbally acknowledged these results. IMPRESSION: 1. Acute subdural hematoma along the left aspect of the falx, measuring up to 4 mm in thickness. 2. Mild-to-moderate chronic small vessel ischemic changes within the cerebral white matter. 3. Mild generalized parenchymal atrophy. Electronically Signed   By: Kellie Simmering D.O.   On: 01/04/2023 14:21   DG Chest 2 View  Result Date: 01/04/2023 CLINICAL DATA:  ant chest wall pain EXAM: CHEST - 2 VIEW COMPARISON:  12/10/2022 FINDINGS: Cardiac silhouette is unremarkable. No pneumothorax or pleural effusion. There is pulmonary vascular congestion without focal consolidation. Aorta is calcified. There are thoracic degenerative changes. IMPRESSION: Pulmonary vascular congestion without focal consolidation. Electronically Signed   By: Sammie Bench M.D.   On: 01/04/2023 13:18   DG Hip Unilat W or Wo Pelvis 2-3 Views Left  Result Date: 01/04/2023 CLINICAL DATA:  Status post fall EXAM: DG HIP (WITH OR WITHOUT PELVIS) 2-3V LEFT COMPARISON:  CT AP 08/26/2021 FINDINGS: Both hips appear located. No signs of acute fracture or dislocation. No evidence for pelvic diastasis. Mild bilateral and symmetric osteoarthritis noted within the hips. IMPRESSION: 1. No acute findings. 2. Mild bilateral and symmetric osteoarthritis. Electronically Signed   By: Kerby Moors M.D.   On: 01/04/2023 13:12     PROCEDURES:  Critical Care performed: Yes, see critical care procedure note(s)  CRITICAL CARE Performed  by: Melvenia Needles   Total critical care time: 30 minutes  Critical care time was exclusive of separately billable procedures and treating other patients.  Critical care was necessary to treat or prevent imminent or life-threatening deterioration.  Critical care was time spent personally by me on the following activities: development of treatment plan with patient and/or surrogate as well as nursing, discussions with consultants, evaluation of patient's response to treatment, examination of patient, obtaining history from patient or surrogate, ordering and performing treatments and interventions, ordering and review of laboratory studies, ordering and review of radiographic studies, pulse oximetry and re-evaluation of patient's condition.    MEDICATIONS ORDERED IN ED: Medications  acetaminophen (TYLENOL) tablet 650 mg (has no administration in time range)    Or  acetaminophen (TYLENOL) suppository 650 mg (has no administration in time range)  polyethylene glycol (MIRALAX / GLYCOLAX) packet 17 g (has no administration in time range)  ondansetron (ZOFRAN) tablet 4 mg (has no administration in time range)    Or  ondansetron (ZOFRAN) injection 4 mg (has no administration in time range)  traMADol (ULTRAM) tablet 100 mg (100 mg Oral Given 01/04/23 1414)  gabapentin (NEURONTIN) capsule 200 mg (200 mg Oral Given 01/04/23 1426)  torsemide (DEMADEX) tablet 40 mg (40 mg Oral Given 01/04/23 1426)  potassium chloride SA (KLOR-CON M) CR  tablet 40 mEq (40 mEq Oral Given 01/04/23 1838)  sodium chloride 0.9 % bolus 250 mL (250 mLs Intravenous New Bag/Given 01/04/23 1837)     IMPRESSION / MDM / ASSESSMENT AND PLAN / ED COURSE  I reviewed the triage vital signs and the nursing notes.                              Differential diagnosis includes, but is not limited to, SDH, compressed skull fracture, cervical spine fracture, cervical radiculopathy, C-spine fracture, lumbar spine fracture, lumbar  radiculopathy, hip fracture, hip dislocation, rib fracture, chest wall contusion  Patient's presentation is most consistent with acute presentation with potential threat to life or bodily function.  The patient is on the cardiac monitor to evaluate for evidence of arrhythmia and/or significant heart rate changes.   ----------------------------------------- 2:23 PM on 01/04/2023 ----------------------------------------- Received no notification from the radiologist regarding the patient's head CT.  Critical result called noted a 4 mm subdural hemorrhage at the falx of the occiput.  ----------------------------------------- 3:47 PM on 01/04/2023 ----------------------------------------- S/W Dr. Ethelene Browns: Will review the images and the case, and evaluate the patient.  He plans admission for at least observation and initial management of her SDH, cervical spine and thoracic compression fractures.  ----------------------------------------- 4:21 PM on 01/04/2023 ----------------------------------------- S/W Dr. Ethelene Browns: He suggest repeat head CT in 6 hours, and suggest patient preference regarding use of c-collar and TLSO, not sure if patient will tolerate either brace at this time.  He will either evaluate the patient in the ED this evening or during morning rounds.  Patient's diagnosis is consistent with mechanical fall resulting in a 4 mm subdural hematoma, C3 spinous and transverse process fractures, and an acute T3 compression fracture.  Patient and her adult son are agreeable to the plan of admission for observation and pain control.  Patient is placed in a [place] J collar, and currently is tolerating that without difficulty.  I discussed the fact that she may not be able to tolerate both the c-collar and the TLSO at this time.  Patient reports she is not any acute pain at this time and is otherwise comfortable while awaiting admission orders.    FINAL CLINICAL IMPRESSION(S) / ED  DIAGNOSES   Final diagnoses:  Subdural hematoma (HCC)  Closed nondisplaced fracture of third cervical vertebra, unspecified fracture morphology, initial encounter ([place])  Closed wedge compression fracture of T3 vertebra, initial encounter ([place])  Hypokalemia     Rx / DC Orders   ED Discharge Orders          Ordered    Apply [place] cervical collar        01/04/23 1537             Note:  This document was prepared using Dragon voice recognition software and may include unintentional dictation errors.    Melvenia Needles, PA-C 01/04/23 1840    Blake Divine, MD 01/04/23 [phone]

## 2023-01-04 NOTE — ED Notes (Signed)
Patient requested that C-Collar be removed while taking Potassium PO. Jenise Bacon-Menshew PA-C is aware and agrees. Son is at bedside.

## 2023-01-04 NOTE — Assessment & Plan Note (Signed)
Secondary to mechanical fall although patient does have a history of osteoporosis.  - Neurosurgery following; appreciate their recommendations - Bedrest until cleared for ambulation

## 2023-01-04 NOTE — ED Triage Notes (Signed)
Per EMT report, [place] her O2 concentrator which had fallen. Patient hit back of head on a car, denies LOC. EMT reports contusion on back of head. Patient c/o upper back pain at scene, and then c/o CP en route. EMT report 12 Lead was WNL. Patient states she has had several falls lately.   B/P 130/70 Sats 97% on 2L O2 which is her baseline Pulse 62

## 2023-01-04 NOTE — Assessment & Plan Note (Addendum)
Per chart review, patient's heart rate has been low at previous office visits, consistent with chronic sinus bradycardia.  No symptoms reported at this time.

## 2023-01-04 NOTE — Assessment & Plan Note (Signed)
Patient is euvolemic at this time.  - Continue home diuretics

## 2023-01-04 NOTE — ED Notes (Signed)
Purewick is in place. Patient remains alert and oriented.

## 2023-01-04 NOTE — Assessment & Plan Note (Signed)
Patient [place] with several injuries as noted below.  She denies any symptoms prior to the fall, stating she felt quite well.  - PT/OT when cleared by neurosurgery

## 2023-01-04 NOTE — ED Notes (Signed)
Pt being transported to rm 131 at this time.

## 2023-01-04 NOTE — Assessment & Plan Note (Signed)
Today's creatinine 1.58 with a GFR of 31.  Holding Zaroxolyn today.

## 2023-01-04 NOTE — H&P (Signed)
History and Physical    Patient: Helen Ramsey [id number] DOB: August 22, 1933 DOA: 01/04/2023 DOS: the patient was seen and examined on 01/04/2023 PCP: Virginia Crews, MD  Patient coming from: Home  Chief Complaint:  Chief Complaint  Patient presents with   Fall   HPI: Helen Ramsey is a 89 y.o. female with medical history significant of chronic hypoxic respiratory failure on 2 L of supplemental oxygen, HFpEF, osteoporosis, CKD stage IIIb, morphea on methotrexate, who presents to the ED with complaints of a fall.  [name] states she was feeling well today and decided to go to church.  When she was getting out of her car using her walker and starting to walk up the ramp, her walker hit a bump and the oxygen concentrator began to fall.  She tried to catch it when she stood back up, she fell backwards and hit her head against the car.  Her oxygen concentrator hit her in the chest and she subsequently fell to the ground.  She denies any loss of consciousness.  After the fall, she was experiencing head pain, chest pain and back pain.  She denies any shortness of breath or palpitations.  She denies any recent illness, including fever, chills, nausea, vomiting, diarrhea, abdominal pain.  ED course: On arrival to the ED, patient was normotensive at 129/64 with heart rate of 65.  She was saturating at 98% on her home 2 L.  Initial workup remarkable for WBC of 8.6, hemoglobin of 10.3, sodium of 132, potassium of 2.8, glucose 139, BUN 39, creatinine of 1.56 and GFR of 32.  Chest x-ray was obtained that demonstrated mild vascular congestion.  Hip x-ray was obtained that did not show any acute findings.  CT of the head, C-spine, L-spine and T-spine were obtained.  CT head demonstrated 4 mm subdural hematoma.  CT of the C-spine demonstrated acute C3 fracture and T-spine demonstrated compression T2 fracture.  Neurosurgery was consulted.  Patient placed in C-spine collar.  TRH consulted for  admission.  Review of Systems: As mentioned in the history of present illness. All other systems reviewed and are negative.  Past Medical History:  Diagnosis Date   Abnormal SPEP    Allergic rhinitis, cause unspecified    Allergic rhinitis, cause unspecified    Atherosclerosis of aorta (HCC)    Back pain    lumbar   Degeneration of lumbar or lumbosacral intervertebral disc    Diverticulitis of colon (without mention of hemorrhage)(562.11)    Dizziness and giddiness    Dysthymic disorder    Headache(784.0)    Myalgia and myositis, unspecified    Osteoporosis, unspecified    Other and unspecified hyperlipidemia    Other diseases of nasal cavity and sinuses(478.19)    Other specified disorders of bladder    Syncope    Trigger finger (acquired)    Unspecified essential hypertension    Unspecified sleep apnea    Urinary frequency    Past Surgical History:  Procedure Laterality Date   ABDOMINAL HYSTERECTOMY     ADENOIDECTOMY     APPENDECTOMY     ARTHROSCOPIC KNEE SURGERY     BREAST BIOPSY Right    lymph node surgery, benign    BREAST EXCISIONAL BIOPSY     CARDIAC CATHETERIZATION     [facility]   CARPAL TUNNEL RELEASE     CATARACT EXTRACTION W/ INTRAOCULAR LENS IMPLANT & ANTERIOR VITRECTOMY, BILATERAL     CHOLECYSTECTOMY     KNEE ARTHROPLASTY Right 01/30/2016  Procedure: COMPUTER ASSISTED TOTAL KNEE ARTHROPLASTY;  Surgeon: Dereck Leep, MD;  Location: [facility] ORS;  Service: Orthopedics;  Laterality: Right;   R/O Lymph Nodes; Right Axilla     ROTATOR CUFF REPAIR Right    SPG block  03/20/2016   sphenopalatine ganglion and maxillary division of trigeminal nerve block block Dr. Manuella Ghazi   TEMPORAL ARTERY BIOPSY / LIGATION     TONSILLECTOMY     TUBAL LIGATION     Social History:  reports that she has never smoked. She has never used smokeless tobacco. She reports that she does not drink alcohol and does not use drugs.  Allergies  Allergen Reactions   Amoxicillin     Clindamycin/Lincomycin    Doxycycline    Levaquin [Levofloxacin In D5w]    Mirabegron Nausea And Vomiting   Nsaids Other (See Comments)    GI upset   Oxycodone Nausea Only    Other reaction(s): Hallucination   Phenergan [Promethazine Hcl]    Singulair [Montelukast]    Vicodin [Hydrocodone-Acetaminophen]    Zocor [Simvastatin]    Zoloft  [Sertraline] Other (See Comments)   Ampicillin Rash   Bactrim [Sulfamethoxazole-Trimethoprim] Rash   Levofloxacin Other (See Comments)    Weakness   Penicillins Rash    Ampicillin, Clindamycin, Doxycycline Levaquin-severe headache, muscle and joint aches, weak   Tolmetin     Other reaction(s): Other (See Comments) GI upset    Family History  Problem Relation Age of Onset   Heart disease Mother    Depression Mother    Colon polyps Mother    Dementia Mother    Heart disease Father    Dementia Father    Heart disease Brother    Hypertension Brother    Heart disease Brother    Hypertension Brother    Kidney cancer Neg Hx    Bladder Cancer Neg Hx     Prior to Admission medications   Medication Sig Start Date End Date Taking? Authorizing Provider  aspirin EC 81 MG tablet Take 81 mg by mouth daily.   Yes [provider]  diphenhydrAMINE (BENADRYL) 25 MG tablet Take 1 tablet (25 mg total) by mouth at bedtime as needed for sleep. 12/11/22  Yes Lorella Nimrod, MD  ferrous sulfate 325 (65 FE) MG tablet Take 325 mg by mouth daily with supper.   Yes [provider]  folic acid (FOLVITE) 1 MG tablet Take 1 mg by mouth daily. 04/28/22 04/28/23 Yes [provider]  phenazopyridine (AZO-TABS) 95 MG tablet Take 1 tablet (95 mg total) by mouth as needed. Patient taking differently: Take 95 mg by mouth 2 (two) times daily. 05/29/16  Yes Mar Daring, PA-C  acetaminophen (TYLENOL) 500 MG tablet Take 500 mg by mouth in the morning and at bedtime.     [provider]  ALPRAZolam Duanne Moron) 0.25 MG tablet TAKE 1 TABLET BY  MOUTH TWICE DAILY AS NEEDED FOR ANXIETY 11/20/22   Bacigalupo, [name], MD  atorvastatin (LIPITOR) 20 MG tablet Take 1 tablet (20 mg total) by mouth daily. 11/20/22   Virginia Crews, MD  cetirizine (ZYRTEC) 10 MG tablet 1 tablet 1 (one) time each day 11/17/21   [provider]  Cholecalciferol (VITAMIN D3 PO) Take 1,000 Units by mouth.    [provider]  citalopram (CELEXA) 40 MG tablet Take 1 tablet (40 mg total) by mouth daily. 11/20/22   Virginia Crews, MD  cyanocobalamin 1000 MCG tablet Take 1,000 mcg by mouth daily.  [provider]  Dentifrices (FLUORIDE TOOTHPASTE DT) Place onto teeth daily.     [provider]  famotidine (PEPCID) 20 MG tablet Take 1 tablet (20 mg total) by mouth daily. 11/20/22   Virginia Crews, MD  fluticasone furoate-vilanterol (BREO ELLIPTA) 100-25 MCG/ACT AEPB Inhale 1 puff into the lungs daily. 11/20/22   Bacigalupo, [name], MD  gabapentin (NEURONTIN) 100 MG capsule Take 2 capsules (200 mg total) by mouth 2 (two) times daily AND 3 capsules (300 mg total) at bedtime. 11/20/22   Bacigalupo, [name], MD  Galcanezumab-gnlm (EMGALITY ) Inject 300 mg as directed every 30 (thirty) days.  09/18/18   [provider]  glycerin adult 2 g suppository Place 1 suppository rectally as needed for constipation. 12/18/22   Virginia Crews, MD  hydroxychloroquine (PLAQUENIL) 200 MG tablet Take 200 mg by mouth daily. 01/20/22   [provider]  levothyroxine (SYNTHROID) 50 MCG tablet TAKE 1 TABLET(50 MCG) BY MOUTH DAILY BEFORE BREAKFAST Patient taking differently: Take 50 mcg by mouth daily before breakfast. TAKE 1 TABLET(50 MCG) BY MOUTH DAILY BEFORE BREAKFAST 11/20/22   Bacigalupo, [name], MD  linaclotide [facility]) 145 MCG CAPS capsule Take 1 capsule (145 mcg total) by mouth daily before breakfast. 11/20/22   Bacigalupo, [name], MD  MAGNESIUM GLYCINATE PO Take 400 mg by mouth daily.    [provider]  magnesium  hydroxide (MILK OF MAGNESIA) 400 MG/5ML suspension Take 5 mLs by mouth daily as needed for mild constipation.    [provider]  melatonin 5 MG TABS Take 5 mg by mouth at bedtime as needed.    [provider]  methotrexate (RHEUMATREX) 2.5 MG tablet Take 10 mg by mouth once a week. 04/28/22   [provider]  metolazone (ZAROXOLYN) 5 MG tablet Take 5 mg by mouth every other day. 12/02/22 05/19/23  [provider]  nystatin (MYCOSTATIN/NYSTOP) powder Apply 1 Application topically 3 (three) times daily. 06/04/22   Gwyneth Sprout, FNP  Omega-3 Fatty Acids (FISH OIL) 1200 MG CAPS Take 1,200 mg by mouth daily.    [provider]  omeprazole (PRILOSEC) 20 MG capsule TAKE 1 CAPSULE(20 MG) BY MOUTH DAILY 11/20/22   Bacigalupo, [name], MD  Polyethylene Glycol 3350 (MIRALAX PO) Take by mouth.    [provider]  potassium gluconate 595 (99 K) MG TABS tablet Take 1 tablet (595 mg total) by mouth 2 (two) times daily. 12/27/18   Mar Daring, PA-C  senna (SENOKOT) 8.6 MG tablet Take 1 tablet (8.6 mg total) by mouth daily. 09/08/22   Bacigalupo, [name], MD  sodium chloride (OCEAN) 0.65 % nasal spray Place 1 spray into the nose as needed.    [provider]  Torsemide 40 MG TABS Take 40 mg by mouth 2 (two) times daily. 12/04/22 12/04/23  [provider]  traMADol (ULTRAM) 50 MG tablet TAKE 1 TABLET BY MOUTH TWICE DAILY AS NEEDED Patient taking differently: Take 50 mg by mouth 3 (three) times daily. TAKE 1 TABLET BY MOUTH 3 times daily 04/17/21   Virginia Crews, MD  triamcinolone cream (KENALOG) 0.1 % 2 weeks on and off 2 weeks 12/20/21   [provider]    Physical Exam: Vitals:   01/04/23 1527 01/04/23 1600 01/04/23 1700 01/04/23 1800  BP: (!) 143/52 (!) 133/58 (!) 131/55 122/61  Pulse: (!) 55 61 65 67  Resp: 18     SpO2: 97% 96% 99% 96%  Weight:  Height:       Physical Exam Vitals and nursing note reviewed.   Constitutional:      General: She is not in acute distress.    Appearance: She is normal weight. She is not toxic-appearing.  HENT:     Head: Normocephalic.     Mouth/Throat:     Mouth: Mucous membranes are moist.     Pharynx: Oropharynx is clear.  Eyes:     Conjunctiva/sclera: Conjunctivae normal.     Pupils: Pupils are equal, round, and reactive to light.  Neck:     Comments: C-collar in place Cardiovascular:     Rate and Rhythm: Regular rhythm. Bradycardia present.     Heart sounds: No murmur heard.    No gallop.  Pulmonary:     Effort: Pulmonary effort is normal. No respiratory distress.     Breath sounds: Normal breath sounds. No wheezing, rhonchi or rales.  Chest:     Chest wall: Tenderness (To palpation) present.  Abdominal:     General: Bowel sounds are normal. There is no distension.     Palpations: Abdomen is soft.     Tenderness: There is no abdominal tenderness. There is no guarding.  Musculoskeletal:     Right lower leg: No edema.     Left lower leg: No edema.  Skin:    General: Skin is warm and dry.  Neurological:     General: No focal deficit present.     Mental Status: She is alert and oriented to person, place, and time. Mental status is at baseline.  Psychiatric:        Mood and Affect: Mood normal.        Behavior: Behavior normal.    Data Reviewed: CBC with WBC of 8.6, hemoglobin 10.3, MCV 98 and platelets of 200 BMP with sodium of 132, potassium 2.8, chloride 90, bicarb 29 glucose 139, BUN 39, creatinine 1.56 and GFR of 32  EKG personally reviewed.  Sinus rhythm with rate of 56.  No ST or T wave changes concerning for acute ischemia.  CT Cervical Spine Wo Contrast  Result Date: 01/04/2023 CLINICAL DATA:  [place] [address]. EXAM: CT CERVICAL SPINE WITHOUT CONTRAST TECHNIQUE: Multidetector CT imaging of the cervical spine was performed without intravenous contrast. Multiplanar CT image reconstructions were also generated. RADIATION DOSE REDUCTION: This exam was  performed according to the departmental dose-optimization program which includes automated exposure control, adjustment of the mA and/or kV according to patient size and/or use of iterative reconstruction technique. COMPARISON:  12/10/2022 FINDINGS: Alignment: Stable alignment of the cervical vertebral bodies. Degenerative anterior subluxations at C4 and C5 are unchanged. Skull base and vertebrae: Advanced degenerative changes at C1-2 with pannus formation but no significant mass effect on the upper cervical cord. There is an acute fracture involving the right lamina and spinous process of C3. The pedicles are intact. No facet fractures. The facets are normally aligned. There is also a compression fracture T2. Soft tissues and spinal canal: No prevertebral fluid or swelling. No visible canal hematoma. Disc levels:  Stable calcified disc protrusions at C4-5 and C5-6. Upper chest: The lung apices are grossly clear. Other: No neck mass or adenopathy or hematoma. Bilateral carotid artery calcifications. IMPRESSION: 1. Acute fracture involving the right lamina and spinous process of C3. 2. Compression fracture of T2. 3. Stable degenerative anterior subluxations at C4 and C5. 4. Advanced degenerative changes at C1-2 with pannus formation but no significant mass effect on the upper cervical cord. 5. Stable calcified disc  protrusions at C4-5 and C5-6. Electronically Signed   By: Marijo Sanes M.D.   On: 01/04/2023 14:33   CT Thoracic Spine Wo Contrast  Result Date: 01/04/2023 CLINICAL DATA:  [address] in parking lot. EXAM: CT THORACIC AND LUMBAR SPINE WITHOUT CONTRAST TECHNIQUE: Multidetector CT imaging of the thoracic and lumbar spine was performed without contrast. Multiplanar CT image reconstructions were also generated. RADIATION DOSE REDUCTION: This exam was performed according to the departmental dose-optimization program which includes automated exposure control, adjustment of the mA and/or kV according to patient  size and/or use of iterative reconstruction technique. COMPARISON:  Chest CT 12/10/2022 FINDINGS: CT THORACIC SPINE FINDINGS Alignment: Stable thoracic scoliosis and exaggerated thoracic kyphosis. Normal alignment in the sagittal plane. Vertebrae: There is a T2 compression fracture which was not present on the prior chest CT. The L1 compression fracture is chronic. No other thoracic compression fractures are identified. There is significant osteoporosis and changes of DISH. Paraspinal and other soft tissues: No significant paraspinal, posterior mediastinal or posterior long abnormality. A large calcified splenic cyst is noted. Advanced aortic calcifications but no aneurysm. Disc levels: The thoracic spinal canal is generous. No significant canal stenosis. No retropulsion at T2. CT LUMBAR SPINE FINDINGS Segmentation: Transitional lumbar anatomy noted with partial lumbarization of S1. Alignment: Degenerative anterolisthesis of L5. Vertebrae: Remote L1 compression fracture with mild retropulsion of the posterosuperior aspect of vertebral body. Advanced osteoporosis but no other fractures are identified. Advanced multilevel facet disease but no definite pars defects or fractures. Paraspinal and other soft tissues: No significant paraspinal or retroperitoneal findings. Advanced aortic calcifications but no aneurysm. Disc levels: T12-L1: Retropulsion of L1 with flattening of the ventral thecal sac but no significant canal stenosis. L1-2: No significant findings. L2-3: No significant findings. L3-4: Bulging annulus and advanced facet disease with ligamentum flavum thickening contributing to mild spinal and bilateral lateral recess stenosis. There is also mild right foraminal stenosis. L4-5: Diffuse bulging uncovered disc, advanced facet disease and ligamentum flavum thickening contributing to moderate spinal and bilateral lateral recess stenosis. No significant foraminal stenosis. L5-S1: Bulging uncovered disc and severe  facet disease with moderate spinal and bilateral lateral recess stenosis and mild bilateral foraminal stenosis. Small disc space at S1-2 but no significant findings. IMPRESSION: 1. New/acute T2 compression fracture. No significant retropulsion or canal compromise. 2. Chronic L1 compression fracture. 3. Advanced osteoporosis but no other fractures are identified. 4. Moderate spinal and bilateral lateral recess stenosis at L4-5 and L5-S1. 5. Mild spinal and bilateral lateral recess stenosis at L3-4. There is also mild right foraminal stenosis at this level. Aortic Atherosclerosis (ICD10-I70.0). Electronically Signed   By: Marijo Sanes M.D.   On: 01/04/2023 14:24   CT Lumbar Spine Wo Contrast  Result Date: 01/04/2023 CLINICAL DATA:  [address] in parking lot. EXAM: CT THORACIC AND LUMBAR SPINE WITHOUT CONTRAST TECHNIQUE: Multidetector CT imaging of the thoracic and lumbar spine was performed without contrast. Multiplanar CT image reconstructions were also generated. RADIATION DOSE REDUCTION: This exam was performed according to the departmental dose-optimization program which includes automated exposure control, adjustment of the mA and/or kV according to patient size and/or use of iterative reconstruction technique. COMPARISON:  Chest CT 12/10/2022 FINDINGS: CT THORACIC SPINE FINDINGS Alignment: Stable thoracic scoliosis and exaggerated thoracic kyphosis. Normal alignment in the sagittal plane. Vertebrae: There is a T2 compression fracture which was not present on the prior chest CT. The L1 compression fracture is chronic. No other thoracic compression fractures are identified. There is significant osteoporosis and changes of  DISH. Paraspinal and other soft tissues: No significant paraspinal, posterior mediastinal or posterior long abnormality. A large calcified splenic cyst is noted. Advanced aortic calcifications but no aneurysm. Disc levels: The thoracic spinal canal is generous. No significant canal stenosis. No  retropulsion at T2. CT LUMBAR SPINE FINDINGS Segmentation: Transitional lumbar anatomy noted with partial lumbarization of S1. Alignment: Degenerative anterolisthesis of L5. Vertebrae: Remote L1 compression fracture with mild retropulsion of the posterosuperior aspect of vertebral body. Advanced osteoporosis but no other fractures are identified. Advanced multilevel facet disease but no definite pars defects or fractures. Paraspinal and other soft tissues: No significant paraspinal or retroperitoneal findings. Advanced aortic calcifications but no aneurysm. Disc levels: T12-L1: Retropulsion of L1 with flattening of the ventral thecal sac but no significant canal stenosis. L1-2: No significant findings. L2-3: No significant findings. L3-4: Bulging annulus and advanced facet disease with ligamentum flavum thickening contributing to mild spinal and bilateral lateral recess stenosis. There is also mild right foraminal stenosis. L4-5: Diffuse bulging uncovered disc, advanced facet disease and ligamentum flavum thickening contributing to moderate spinal and bilateral lateral recess stenosis. No significant foraminal stenosis. L5-S1: Bulging uncovered disc and severe facet disease with moderate spinal and bilateral lateral recess stenosis and mild bilateral foraminal stenosis. Small disc space at S1-2 but no significant findings. IMPRESSION: 1. New/acute T2 compression fracture. No significant retropulsion or canal compromise. 2. Chronic L1 compression fracture. 3. Advanced osteoporosis but no other fractures are identified. 4. Moderate spinal and bilateral lateral recess stenosis at L4-5 and L5-S1. 5. Mild spinal and bilateral lateral recess stenosis at L3-4. There is also mild right foraminal stenosis at this level. Aortic Atherosclerosis (ICD10-I70.0). Electronically Signed   By: Marijo Sanes M.D.   On: 01/04/2023 14:24   CT HEAD WO CONTRAST (5MM)  Result Date: 01/04/2023 CLINICAL DATA:  Provided history: Head  trauma, minor. EXAM: CT HEAD WITHOUT CONTRAST TECHNIQUE: Contiguous axial images were obtained from the base of the skull through the vertex without intravenous contrast. RADIATION DOSE REDUCTION: This exam was performed according to the departmental dose-optimization program which includes automated exposure control, adjustment of the mA and/or kV according to patient size and/or use of iterative reconstruction technique. COMPARISON:  Head CT 12/10/2022. FINDINGS: Brain: Mild generalized parenchymal atrophy. Acute subdural hematoma along the left aspect of the falx, measuring up to 4 mm in thickness. Mild-to-moderate patchy and ill-defined hypoattenuation within the cerebral white matter, nonspecific but compatible with chronic small vessel disease. No demarcated cortical infarct. No evidence of an intracranial mass. No midline shift. Vascular: No hyperdense vessel.  Atherosclerotic calcifications. Skull: No fracture or aggressive osseous lesion. Sinuses/Orbits: No mass or acute finding within the imaged orbits. No significant paranasal sinus disease at the imaged levels. Impression #1 called by telephone at the time of interpretation on 01/04/2023 at 2:20 pm to provider JENISE MENSHEW , who verbally acknowledged these results. IMPRESSION: 1. Acute subdural hematoma along the left aspect of the falx, measuring up to 4 mm in thickness. 2. Mild-to-moderate chronic small vessel ischemic changes within the cerebral white matter. 3. Mild generalized parenchymal atrophy. Electronically Signed   By: Kellie Simmering D.O.   On: 01/04/2023 14:21   DG Chest 2 View  Result Date: 01/04/2023 CLINICAL DATA:  ant chest wall pain EXAM: CHEST - 2 VIEW COMPARISON:  12/10/2022 FINDINGS: Cardiac silhouette is unremarkable. No pneumothorax or pleural effusion. There is pulmonary vascular congestion without focal consolidation. Aorta is calcified. There are thoracic degenerative changes. IMPRESSION: Pulmonary vascular congestion without  focal  consolidation. Electronically Signed   By: Sammie Bench M.D.   On: 01/04/2023 13:18   DG Hip Unilat W or Wo Pelvis 2-3 Views Left  Result Date: 01/04/2023 CLINICAL DATA:  Status post fall EXAM: DG HIP (WITH OR WITHOUT PELVIS) 2-3V LEFT COMPARISON:  CT AP 08/26/2021 FINDINGS: Both hips appear located. No signs of acute fracture or dislocation. No evidence for pelvic diastasis. Mild bilateral and symmetric osteoarthritis noted within the hips. IMPRESSION: 1. No acute findings. 2. Mild bilateral and symmetric osteoarthritis. Electronically Signed   By: Kerby Moors M.D.   On: 01/04/2023 13:12    There are no new results to review at this time.  Assessment and Plan:  * Ground-level fall Patient presenting after mechanical fall at church with several injuries as noted below.  She denies any symptoms prior to the fall, stating she felt quite well.  - PT/OT when cleared by neurosurgery  Subdural hematoma (HCC) CT head with 4 mm subdural hematoma.  - Neurosurgery consulted; appreciate their recommendations - Repeat CT head ordered for this evening - Hold home aspirin for now  C3 cervical fracture (HCC) CT of the C-spine demonstrated acute fracture involving the right lamina and spinous process of the C3.  C-collar has been applied  - Neurosurgery consulted; appreciate their recommendations - Per Dr.Abd-El-Barr, no plans for surgery at this time - PT/OT when cleared by surgery - Continue home tramadol - Dilaudid as needed for breakthrough pain - Continuous pulse oximetry overnight  Compression fracture of T2 vertebra ([place]) Secondary to mechanical fall although patient does have a history of osteoporosis.  - Neurosurgery following; appreciate their recommendations - Bedrest until cleared for ambulation  Hypokalemia Likely due to diuretic use.  Patient takes daily potassium supplementation, however dose may need to be increased.  No evidence of EKG changes.  - S/p 40 mEq of  potassium - Additional dose of 40 mEq ordered - Repeat BMP in the a.m.  Chronic respiratory failure with hypoxia (HCC) - Continue home supplemental oxygen - Continue pulse oximetry overnight while receiving opioids and immobilized due to c-collar  (HFpEF) heart failure with preserved ejection fraction ([place]) Patient is euvolemic at this time.  - Continue home diuretics  Stage 3b chronic kidney disease ([place]) Renal function currently at baseline.   Sinus bradycardia Per chart review, patient's heart rate has been low at previous office visits, consistent with chronic sinus bradycardia.  No symptoms reported at this time.  Advance Care Planning:   Code Status: DNR/DNI.  Verified by patient and her son at bedside collaborates that patient had previously signed a DNR/DNI form.  Consults: Neurosurgery  Family Communication: Patient's son updated at bedside  Severity of Illness: The appropriate patient status for this patient is OBSERVATION. Observation status is judged to be reasonable and necessary in order to provide the required intensity of service to ensure the patient's safety. The patient's presenting symptoms, physical exam findings, and initial radiographic and laboratory data in the context of their medical condition is felt to place them at decreased risk for further clinical deterioration. Furthermore, it is anticipated that the patient will be medically stable for discharge from the hospital within 2 midnights of admission.   Author: Jose Persia, MD 01/04/2023 7:12 PM  For on call review [email or web address].

## 2023-01-04 NOTE — ED Notes (Signed)
Hard C-collar applied. Patient tolerated procedure well.

## 2023-01-04 NOTE — Assessment & Plan Note (Signed)
Chronically on 2 L of oxygen

## 2023-01-04 NOTE — Assessment & Plan Note (Addendum)
CT of the C-spine demonstrated acute fracture involving the right lamina and spinous process of the C3.  C-collar has been applied  - Neurosurgery consulted; appreciate their recommendations - Per Dr.Abd-El-Barr, no plans for surgery at this time - PT/OT when cleared by surgery - Continue home tramadol - Dilaudid as needed for breakthrough pain - Continuous pulse oximetry overnight

## 2023-01-04 NOTE — Assessment & Plan Note (Signed)
Replaced. °

## 2023-01-04 NOTE — ED Notes (Signed)
Report given to Horton RN

## 2023-01-05 DIAGNOSIS — S22020S Wedge compression fracture of second thoracic vertebra, sequela: Secondary | ICD-10-CM

## 2023-01-05 DIAGNOSIS — S065XAA Traumatic subdural hemorrhage with loss of consciousness status unknown, initial encounter: Secondary | ICD-10-CM | POA: Diagnosis not present

## 2023-01-05 DIAGNOSIS — S12201A Unspecified nondisplaced fracture of third cervical vertebra, initial encounter for closed fracture: Secondary | ICD-10-CM

## 2023-01-05 DIAGNOSIS — W19XXXA Unspecified fall, initial encounter: Secondary | ICD-10-CM | POA: Diagnosis not present

## 2023-01-05 DIAGNOSIS — E876 Hypokalemia: Secondary | ICD-10-CM | POA: Diagnosis not present

## 2023-01-05 DIAGNOSIS — I5032 Chronic diastolic (congestive) heart failure: Secondary | ICD-10-CM

## 2023-01-05 DIAGNOSIS — R001 Bradycardia, unspecified: Secondary | ICD-10-CM | POA: Diagnosis not present

## 2023-01-05 DIAGNOSIS — N1832 Chronic kidney disease, stage 3b: Secondary | ICD-10-CM | POA: Diagnosis not present

## 2023-01-05 DIAGNOSIS — J9611 Chronic respiratory failure with hypoxia: Secondary | ICD-10-CM

## 2023-01-05 DIAGNOSIS — S065X0A Traumatic subdural hemorrhage without loss of consciousness, initial encounter: Secondary | ICD-10-CM | POA: Diagnosis not present

## 2023-01-05 DIAGNOSIS — S12200S Unspecified displaced fracture of third cervical vertebra, sequela: Secondary | ICD-10-CM

## 2023-01-05 LAB — BASIC METABOLIC PANEL
Anion gap: 8 (ref 5–15)
BUN: 42 mg/dL — ABNORMAL HIGH (ref 8–23)
CO2: 30 mmol/L (ref 22–32)
Calcium: 8.8 mg/dL — ABNORMAL LOW (ref 8.9–10.3)
Chloride: 98 mmol/L (ref 98–111)
Creatinine, Ser: 1.37 mg/dL — ABNORMAL HIGH (ref 0.44–1.00)
GFR, Estimated: 37 mL/min — ABNORMAL LOW (ref >60)
Glucose, Bld: 94 mg/dL (ref 70–99)
Potassium: 4 mmol/L (ref 3.5–5.1)
Sodium: 136 mmol/L (ref 135–145)

## 2023-01-05 MED ORDER — POTASSIUM CHLORIDE CRYS ER 20 MEQ PO TBCR
40.0000 meq | EXTENDED_RELEASE_TABLET | Freq: Two times a day (BID) | ORAL | Status: DC
  Administered 2023-01-05 – 2023-01-09 (×8): 40 meq via ORAL
  Filled 2023-01-05 (×8): qty 2

## 2023-01-05 NOTE — Hospital Course (Addendum)
89 y.o. female with medical history significant of chronic hypoxic respiratory failure on 2 L of supplemental oxygen, HFpEF, osteoporosis, CKD stage IIIb, morphea on methotrexate, who presents to the ED with complaints of a fall.   Helen Ramsey states she was feeling well today and decided to go to church.  When she was getting out of her car using her walker and starting to walk up the ramp, her walker hit a bump and the oxygen concentrator began to fall.  She tried to catch it when she stood back up, she fell backwards and hit her head against the car.  Her oxygen concentrator hit her in the chest and she subsequently fell to the ground.  She denies any loss of consciousness.  After the fall, she was experiencing head pain, chest pain and back pain.  She denies any shortness of breath or palpitations.  She denies any recent illness, including fever, chills, nausea, vomiting, diarrhea, abdominal pain.   ED course: On arrival to the ED, patient was normotensive at 129/64 with heart rate of 65.  She was saturating at 98% on her home 2 L.  Initial workup remarkable for WBC of 8.6, hemoglobin of 10.3, sodium of 132, potassium of 2.8, glucose 139, BUN 39, creatinine of 1.56 and GFR of 32.  Chest x-ray was obtained that demonstrated mild vascular congestion.  Hip x-ray was obtained that did not show any acute findings.  CT of the head, C-spine, L-spine and T-spine were obtained.  CT head demonstrated 4 mm subdural hematoma.  CT of the C-spine demonstrated acute C3 fracture and T-spine demonstrated compression T2 fracture. Neurosurgery was consulted.  Patient placed in C-spine collar.  TRH consulted for admission.  2/19.  Case discussed with neurosurgery and okay to work with PT and OT.  OT and PT recommending rehab 2/20.  Patient agreeable to go out to rehab. 2/21: Vitals with mildly elevated blood pressure at 152/57.  Medically stable.  TOC is looking for placement. 2/22: Patient remained stable.  Accepted at  [facility].  Most likely can be discharged tomorrow. 2/23: Hemodynamically stable.  Patient will restart her home aspirin from tomorrow on discharge. She was on multiple laxatives few of them but discontinued.  Metolazone was also discontinued.  She is being discharged to skilled care nursing facility for rehab. She will continue the rest of her home medications and need to have a close follow-up with her providers for further recommendations.

## 2023-01-05 NOTE — Progress Notes (Signed)
Progress Note   Patient: Helen Ramsey [id number] DOB: 08-16-1933 DOA: 01/04/2023     0 DOS: the patient was seen and examined on 01/05/2023   Brief hospital course: 89 y.o. female with medical history significant of chronic hypoxic respiratory failure on 2 L of supplemental oxygen, HFpEF, osteoporosis, CKD stage IIIb, morphea on methotrexate, who presents to the ED with complaints of a fall.   Helen Ramsey states she was feeling well today and decided to go to church.  When she was getting out of her car using her walker and starting to walk up the ramp, her walker hit a bump and the oxygen concentrator began to fall.  She tried to catch it when she stood back up, she fell backwards and hit her head against the car.  Her oxygen concentrator hit her in the chest and she subsequently fell to the ground.  She denies any loss of consciousness.  After the fall, she was experiencing head pain, chest pain and back pain.  She denies any shortness of breath or palpitations.  She denies any recent illness, including fever, chills, nausea, vomiting, diarrhea, abdominal pain.   ED course: On arrival to the ED, patient was normotensive at 129/64 with heart rate of 65.  She was saturating at 98% on her home 2 L.  Initial workup remarkable for WBC of 8.6, hemoglobin of 10.3, sodium of 132, potassium of 2.8, glucose 139, BUN 39, creatinine of 1.56 and GFR of 32.  Chest x-ray was obtained that demonstrated mild vascular congestion.  Hip x-ray was obtained that did not show any acute findings.  CT of the head, C-spine, L-spine and T-spine were obtained.  CT head demonstrated 4 mm subdural hematoma.  CT of the C-spine demonstrated acute C3 fracture and T-spine demonstrated compression T2 fracture. Neurosurgery was consulted.  Patient placed in C-spine collar.  TRH consulted for admission.  2/19.  Case discussed with neurosurgery and okay to work with PT and OT.  OT currently recommending rehab.  Assessment and  Plan: Subdural hematoma (HCC) Repeat CT scan shows a stable subdural hematoma.  Continue to monitor.  C3 cervical fracture (HCC) C3 stable fracture.  Currently in hard collar.  Compression fracture of T2 vertebra (HCC) Continue pain management.  Hypokalemia Replaced  Chronic respiratory failure with hypoxia (HCC) Chronically on 2 L of oxygen  (HFpEF) heart failure with preserved ejection fraction [facility]) Patient is euvolemic at this time.  Last EF 60%. Patient on Zaroxolyn every other day continue torsemide 40 mg twice a day.  No beta-blocker with bradycardia.  Stage 3b chronic kidney disease (HCC) Current creatinine 1.37 with a GFR of 37  Sinus bradycardia Per chart review, patient's heart rate has been low at previous office visits, consistent with chronic sinus bradycardia.  No symptoms reported at this time.        Subjective: Patient found to have a C3 fracture, T2 fracture and a subdural hematoma.  Patient's been having some falls at home.  She lives by herself.  Having some pain in her neck.  Able to move her arms and legs.  Physical Exam: Vitals:   01/04/23 2259 01/05/23 0045 01/05/23 0711 01/05/23 0810  BP: 132/76 128/68  122/62  Pulse: (!) 55 (!) 55  61  Resp: 18 16  16  $ Temp: 98.3 F (36.8 C) 98 F (36.7 C)  98.1 F (36.7 C)  TempSrc:    Oral  SpO2: 98% 97% 95% 98%  Weight:  Height:       Physical Exam HENT:     Head: Normocephalic.     Mouth/Throat:     Pharynx: No oropharyngeal exudate.  Eyes:     General: Lids are normal.     Conjunctiva/sclera: Conjunctivae normal.  Cardiovascular:     Rate and Rhythm: Normal rate and regular rhythm.     Heart sounds: Normal heart sounds, S1 normal and S2 normal.  Pulmonary:     Breath sounds: No decreased breath sounds, wheezing, rhonchi or rales.  Abdominal:     Palpations: Abdomen is soft.     Tenderness: There is no abdominal tenderness.  Musculoskeletal:     Right lower leg: No swelling.     Left  lower leg: No swelling.  Skin:    General: Skin is warm.     Findings: No rash.  Neurological:     Mental Status: She is alert and oriented to person, place, and time.     Comments: Able to straight leg raise bilaterally.  Power 4+ out of 5 upper and lower extremities.     Data Reviewed: 2 head CTs reviewed showing subdural hematoma stable nature CT scan of the cervical and thoracic spine reviewed showing acute compression fracture C3 and T2 CT scan of the lumbar spine showed chronic compression fracture of L1 Creatinine 1.37, potassium 4, sodium 136, hemoglobin 10.3  Family Communication: Spoke with son at the bedside  Disposition: Status is: Observation Occupational Therapy recommending rehab.  Awaiting neurosurgical consultation and physical therapy recommendations.  Since the patient does live alone, will need to set up either support at home or out to rehab.  Planned Discharge Destination: Rehab versus home with home health depending on family preference    Time spent: 29 minutes  Author: Loletha Grayer, MD 01/05/2023 1:46 PM  For on call review [email or web address].

## 2023-01-05 NOTE — Consult Note (Addendum)
Full note to follow.  I have reviewed the images.  She is fine to begin mobilizing with PT and OT.  She does not require a brace for stabilization. The fracture at C3 is stable - she can wear a brace if she would like and it is helpful, but it is not necessary for stability.

## 2023-01-05 NOTE — Consult Note (Signed)
Consult requested by:  Dr. Leslye Peer  Consult requested for:  Subdural hematoma, c3 fracture  Primary Physician:  Virginia Crews, MD  History of Present Illness: 01/05/2023 [name] is here today with a chief complaint of multiple falls.  She came to the hospital after having significant pain after a fall.  Her oxygen concentrator hit her in the chest.  She did not lose consciousness.  She had a headache after her fall.  She reports a very mild headache now.  She has more pressing pain around where her oxygen concentrator hit her chest.  She has no other changes in her neurologic status.  She lives home alone.  She has good family support.  I have utilized the care everywhere function in epic to review the outside records available from external health systems.  Review of Systems:  A 10 point review of systems is negative, except for the pertinent positives and negatives detailed in the HPI.  Past Medical History: Past Medical History:  Diagnosis Date   Abnormal SPEP    Allergic rhinitis, cause unspecified    Allergic rhinitis, cause unspecified    Atherosclerosis of aorta (HCC)    Back pain    lumbar   Degeneration of lumbar or lumbosacral intervertebral disc    Diverticulitis of colon (without mention of hemorrhage)(562.11)    Dizziness and giddiness    Dysthymic disorder    Headache(784.0)    Myalgia and myositis, unspecified    Osteoporosis, unspecified    Other and unspecified hyperlipidemia    Other diseases of nasal cavity and sinuses(478.19)    Other specified disorders of bladder    Syncope    Trigger finger (acquired)    Unspecified essential hypertension    Unspecified sleep apnea    Urinary frequency     Past Surgical History: Past Surgical History:  Procedure Laterality Date   ABDOMINAL HYSTERECTOMY     ADENOIDECTOMY     APPENDECTOMY     ARTHROSCOPIC KNEE SURGERY     BREAST BIOPSY Right    lymph node surgery, benign    BREAST  EXCISIONAL BIOPSY     CARDIAC CATHETERIZATION     [facility]   CARPAL TUNNEL RELEASE     CATARACT EXTRACTION W/ INTRAOCULAR LENS IMPLANT & ANTERIOR VITRECTOMY, BILATERAL     CHOLECYSTECTOMY     KNEE ARTHROPLASTY Right 01/30/2016   Procedure: COMPUTER ASSISTED TOTAL KNEE ARTHROPLASTY;  Surgeon: Dereck Leep, MD;  Location: [facility] ORS;  Service: Orthopedics;  Laterality: Right;   R/O Lymph Nodes; Right Axilla     ROTATOR CUFF REPAIR Right    SPG block  03/20/2016   sphenopalatine ganglion and maxillary division of trigeminal nerve block block Dr. Manuella Ghazi   TEMPORAL ARTERY BIOPSY / LIGATION     TONSILLECTOMY     TUBAL LIGATION      Allergies: Allergies as of 01/04/2023 - Review Complete 01/04/2023  Allergen Reaction Noted   Amoxicillin  07/06/2014   Clindamycin/lincomycin  07/06/2014   Doxycycline  07/06/2014   Levaquin [levofloxacin in d5w]  07/06/2014   Mirabegron Nausea And Vomiting 05/09/2015   Nsaids Other (See Comments) 05/09/2015   Oxycodone Nausea Only 03/31/2016   Phenergan [promethazine hcl]  07/06/2014   Singulair [montelukast]  07/06/2014   Vicodin [hydrocodone-acetaminophen]  07/06/2014   Zocor [simvastatin]  07/06/2014   Zoloft  [sertraline] Other (See Comments) 03/28/2015   Ampicillin Rash 05/09/2015   Bactrim [sulfamethoxazole-trimethoprim] Rash 12/13/2015   Levofloxacin Other (See Comments) 10/17/2014  Penicillins Rash 03/28/2015   Tolmetin  05/09/2015    Medications: Current Meds  Medication Sig   acetaminophen (TYLENOL) 500 MG tablet Take 1,000 mg by mouth in the morning and at bedtime.   albuterol (VENTOLIN HFA) 108 (90 Base) MCG/ACT inhaler Inhale 2 puffs into the lungs every 6 (six) hours as needed for wheezing or shortness of breath.   ALPRAZolam (XANAX) 0.25 MG tablet TAKE 1 TABLET BY MOUTH TWICE DAILY AS NEEDED FOR ANXIETY   aspirin EC 81 MG tablet Take 81 mg by mouth daily.   atorvastatin (LIPITOR) 20 MG tablet Take 1 tablet (20 mg total) by mouth daily.    Cholecalciferol (VITAMIN D3 PO) Take 1,000 Units by mouth.   citalopram (CELEXA) 40 MG tablet Take 1 tablet (40 mg total) by mouth daily.   cyanocobalamin 1000 MCG tablet Take 1,000 mcg by mouth daily.   diphenhydrAMINE (BENADRYL) 25 MG tablet Take 1 tablet (25 mg total) by mouth at bedtime as needed for sleep.   famotidine (PEPCID) 20 MG tablet Take 1 tablet (20 mg total) by mouth daily.   ferrous sulfate 325 (65 FE) MG tablet Take 325 mg by mouth daily with supper.   fluticasone furoate-vilanterol (BREO ELLIPTA) 100-25 MCG/ACT AEPB Inhale 1 puff into the lungs daily.   folic acid (FOLVITE) 1 MG tablet Take 1 mg by mouth daily.   gabapentin (NEURONTIN) 100 MG capsule Take 2 capsules (200 mg total) by mouth 2 (two) times daily AND 3 capsules (300 mg total) at bedtime.   glycerin adult 2 g suppository Place 1 suppository rectally as needed for constipation.   hydroxychloroquine (PLAQUENIL) 200 MG tablet Take 200 mg by mouth daily.   levothyroxine (SYNTHROID) 50 MCG tablet TAKE 1 TABLET(50 MCG) BY MOUTH DAILY BEFORE BREAKFAST (Patient taking differently: Take 50 mcg by mouth daily before breakfast. TAKE 1 TABLET(50 MCG) BY MOUTH DAILY BEFORE BREAKFAST)   linaclotide (LINZESS) 145 MCG CAPS capsule Take 1 capsule (145 mcg total) by mouth daily before breakfast.   MAGNESIUM GLYCINATE PO Take 400 mg by mouth daily.   magnesium hydroxide (MILK OF MAGNESIA) 400 MG/5ML suspension Take 5 mLs by mouth daily as needed for mild constipation.   melatonin 5 MG TABS Take 5 mg by mouth at bedtime as needed.   nystatin (MYCOSTATIN/NYSTOP) powder Apply 1 Application topically 3 (three) times daily.   Omega-3 Fatty Acids (FISH OIL) 1200 MG CAPS Take 1,200 mg by mouth daily.   omeprazole (PRILOSEC) 20 MG capsule TAKE 1 CAPSULE(20 MG) BY MOUTH DAILY   phenazopyridine (AZO-TABS) 95 MG tablet Take 1 tablet (95 mg total) by mouth as needed. (Patient taking differently: Take 95 mg by mouth 2 (two) times daily.)    Polyethylene Glycol 3350 (MIRALAX PO) Take 17 g by mouth daily.   potassium gluconate 595 (99 K) MG TABS tablet Take 1 tablet (595 mg total) by mouth 2 (two) times daily.   senna (SENOKOT) 8.6 MG tablet Take 1 tablet (8.6 mg total) by mouth daily.   sodium chloride (OCEAN) 0.65 % nasal spray Place 1 spray into the nose as needed.   Torsemide 40 MG TABS Take 40 mg by mouth 2 (two) times daily.   traMADol (ULTRAM) 50 MG tablet TAKE 1 TABLET BY MOUTH TWICE DAILY AS NEEDED (Patient taking differently: Take 50 mg by mouth 3 (three) times daily.)   triamcinolone cream (KENALOG) 0.1 % 2 weeks on and off 2 weeks    Social History: Social History   Tobacco Use  Smoking status: Never   Smokeless tobacco: Never  Vaping Use   Vaping Use: Never used  Substance Use Topics   Alcohol use: No   Drug use: No    Family Medical History: Family History  Problem Relation Age of Onset   Heart disease Mother    Depression Mother    Colon polyps Mother    Dementia Mother    Heart disease Father    Dementia Father    Heart disease Brother    Hypertension Brother    Heart disease Brother    Hypertension Brother    Kidney cancer Neg Hx    Bladder Cancer Neg Hx     Physical Examination: Vitals:   01/05/23 0810 01/05/23 1449  BP: 122/62 (!) 145/52  Pulse: 61 (!) 51  Resp: 16 18  Temp: 98.1 F (36.7 C) 97.8 F (36.6 C)  SpO2: 98% 97%    General: Patient is well developed, well nourished, calm, collected, and in no apparent distress. Attention to examination is appropriate.  Neck:   Supple.  Full range of motion.  Respiratory: Patient is breathing without any difficulty on oxygen via Nasal cannula.   NEUROLOGICAL:     Awake, alert, oriented to person, place, and time.  Speech is clear and fluent.  Cranial Nerves: Pupils equal round and reactive to light.  Facial tone is symmetric.  Facial sensation is symmetric. Shoulder shrug is symmetric. Tongue protrusion is midline.  There is no  pronator drift.  ROM of spine: full.    Strength: Side Biceps Triceps Deltoid Interossei Grip Wrist Ext. Wrist Flex.  R 5 5 5 5 5 5 5  $ L 5 5 5 5 5 5 5   $ Side Iliopsoas Quads Hamstring PF DF EHL  R 5 5 5 5 5 5  $ L 5 5 5 5 5 5   $ Reflexes are 1+ and symmetric at the biceps, triceps, brachioradialis, patella and achilles.   Hoffman's is absent.   Bilateral upper and lower extremity sensation is intact to light touch.    No evidence of dysmetria noted.  Gait is untested.     Medical Decision Making  Imaging: CT C spine 01/04/2023 IMPRESSION: 1. Acute fracture involving the right lamina and spinous process of C3. 2. Compression fracture of T2. 3. Stable degenerative anterior subluxations at C4 and C5. 4. Advanced degenerative changes at C1-2 with pannus formation but no significant mass effect on the upper cervical cord. 5. Stable calcified disc protrusions at C4-5 and C5-6.     Electronically Signed   By: Marijo Sanes M.D.   On: 01/04/2023 14:33  CT Head 01/04/2023 IMPRESSION: 1. Acute subdural hematoma along the left aspect of the falx, measuring up to 4 mm in thickness. 2. Mild-to-moderate chronic small vessel ischemic changes within the cerebral white matter. 3. Mild generalized parenchymal atrophy.     Electronically Signed   By: Kellie Simmering D.O.   On: 01/04/2023 14:21  I have personally reviewed the images and agree with the above interpretation.  Assessment and Plan: Ms. Mckeel is a pleasant 89 y.o. female with very small stable subdural hematoma.  She also has a stable C3 fracture.  I do not recommend any surgical intervention.  I have recommended that she wear her collar for comfort.  She does not need any follow-up imaging at this time.  She can has ongoing symptoms moving forward, we will be happy to see her in the clinic.  Given her relatively small subdural hematoma  and her very severe ongoing medical conditions, I would have an extremely high  threshold to consider neurosurgical intervention.  As such, I do not think there is much utility to neurosurgical follow-up for this patient.    I have communicated my recommendations to the requesting physician and coordinated care to facilitate these recommendations.     Rohil Lesch K. Izora Ribas MD, [facility] Neurosurgery

## 2023-01-05 NOTE — TOC Progression Note (Signed)
Transition of Care [facility]) - Progression Note    Patient Details  Name: Helen Ramsey MRN: [id number] Date of Birth: 1933-08-09  Transition of Care [facility]) CM/SW [place], RN Phone Number: 01/05/2023, 9:24 AM  Clinical Narrative:     Transition of Care [facility]) Screening Note   Patient Details  Name: Helen Ramsey Date of Birth: Jan 19, 1933   Transition of Care [facility]) CM/SW Contact:    Laurena Slimmer, RN Phone Number: 01/05/2023, 9:24 AM    Transition of Care Department (TOC) has reviewed patient and no TOC needs have been identified at this time. We will continue to monitor patient advancement through interdisciplinary progression rounds. If new patient transition needs arise, please place a TOC consult.          Expected Discharge Plan and Services                                               Social Determinants of Health (SDOH) Interventions SDOH Screenings   Food Insecurity: No Food Insecurity (01/05/2023)  Housing: Low Risk  (01/05/2023)  Transportation Needs: No Transportation Needs (01/05/2023)  Utilities: Not At Risk (01/05/2023)  Alcohol Screen: Low Risk  (12/18/2022)  Depression (PHQ2-9): Medium Risk (12/18/2022)  Financial Resource Strain: Low Risk  (05/06/2022)  Physical Activity: Insufficiently Active (05/06/2022)  Social Connections: Moderately Isolated (05/06/2022)  Stress: No Stress Concern Present (05/06/2022)  Tobacco Use: Low Risk  (01/04/2023)    Readmission Risk Interventions     No data to display

## 2023-01-05 NOTE — Evaluation (Signed)
Occupational Therapy Evaluation Patient Details Name: Helen Ramsey MRN: [id number] DOB: 1933/03/15 Today's Date: 01/05/2023   History of Present Illness 89 y.o. female with medical history significant of chronic hypoxic respiratory failure on 2 L of supplemental oxygen, HFpEF, osteoporosis, CKD stage IIIb, morphea on methotrexate, who presents to the ED with complaints of a fall. Pt with new C3 fx, T2 FX, and subdural hematoma.   Clinical Impression   Patient presenting with decreased Ind in self care,balance, functional mobility/transfers, endurance, and safety awareness. Patient lives at home alone with use of rollator for mobility and use of 2Ls O2 at baseline. Pt has PCA 1x/wk for 2 hours to assist with shower and IADLs. Pt takes sink baths on the other days without assistance. Pt reports having had 3-4 falls over the last 2 months.  Patient currently functioning at min A for bed mobility and mod lifting assistance to stand from bed and toilet. Pt stands from EOB and urinates while ambulating short distance to bathroom. Pt needing mod A for hygiene and to change clothing. OT using figure four position to don mesh underwear but needing assistance to thread onto feet. Pt stands and has posterior bias in standing needing mod A to for balance. Pt ambulating to recliner chair at end of session with all needs within reach and nursing student staying in room. Patient will benefit from acute OT to increase overall independence in the areas of ADLs, functional mobility, and safety awareness in order to safely discharge to next venue of care.      Recommendations for follow up therapy are one component of a multi-disciplinary discharge planning process, led by the attending physician.  Recommendations may be updated based on patient status, additional functional criteria and insurance authorization.   Follow Up Recommendations  Skilled nursing-short term rehab (<3 hours/day)     Assistance  Recommended at Discharge Frequent or constant Supervision/Assistance  Patient can return home with the following A lot of help with walking and/or transfers;A lot of help with bathing/dressing/bathroom;Help with stairs or ramp for entrance;Assist for transportation;Assistance with cooking/housework    Functional Status Assessment  Patient has had a recent decline in their functional status and demonstrates the ability to make significant improvements in function in a reasonable and predictable amount of time.  Equipment Recommendations  Other (comment) (defer to next venue of care)       Precautions / Restrictions Precautions Precautions: Back;Cervical Precaution Comments: precautions for comfort Required Braces or Orthoses: Cervical Brace Cervical Brace: Hard collar;For comfort      Mobility Bed Mobility Overal bed mobility: Needs Assistance Bed Mobility: Supine to Sit     Supine to sit: Min assist          Transfers Overall transfer level: Needs assistance Equipment used: Rolling walker (2 wheels) Transfers: Sit to/from Stand, Bed to chair/wheelchair/BSC Sit to Stand: Mod assist     Step pivot transfers: Min assist, Mod assist            Balance Overall balance assessment: Needs assistance Sitting-balance support: Feet supported Sitting balance-Leahy Scale: Good   Postural control: Posterior lean Standing balance support: Reliant on assistive device for balance, During functional activity, Bilateral upper extremity supported Standing balance-Leahy Scale: Poor                             ADL either performed or assessed with clinical judgement   ADL Overall ADL's : Needs assistance/impaired  Lower Body Bathing: Moderate assistance   Upper Body Dressing : Minimal assistance;Sitting   Lower Body Dressing: Moderate assistance;Sit to/from stand   Toilet Transfer: Moderate assistance;Rolling walker (2 wheels) Toilet Transfer  Details (indicate cue type and reason): posterior bias in standing Toileting- Clothing Manipulation and Hygiene: Moderate assistance;Sit to/from stand               Vision Patient Visual Report: No change from baseline              Pertinent Vitals/Pain Pain Assessment Pain Assessment: Faces Faces Pain Scale: Hurts even more Pain Location: "All over" Pain Descriptors / Indicators: Discomfort, Aching Pain Intervention(s): Monitored during session, Premedicated before session, Repositioned     Hand Dominance Right   Extremity/Trunk Assessment Upper Extremity Assessment Upper Extremity Assessment: Generalized weakness   Lower Extremity Assessment Lower Extremity Assessment: Generalized weakness       Communication Communication Communication: No difficulties   Cognition Arousal/Alertness: Awake/alert Behavior During Therapy: WFL for tasks assessed/performed Overall Cognitive Status: Within Functional Limits for tasks assessed                                                  Home Living Family/patient expects to be discharged to:: Private residence Living Arrangements: Alone Available Help at Discharge: Family;Available PRN/intermittently;Personal care attendant (PCA 1x/wk for 2 hours) Type of Home: Apartment Home Access: Level entry     Home Layout: One level     Bathroom Shower/Tub: Tub/shower unit;Sponge bathes at baseline   Bathroom Toilet: Standard     Home Equipment: Conservation officer, nature (2 wheels)          Prior Functioning/Environment Prior Level of Function : Independent/Modified Independent               ADLs Comments: Pt reports being mod I with use of rollator at baseline. She has PCA 1x/wk for 2 hours to assist with shower and/or IADLs. Pt does not drive.        OT Problem List: Decreased strength;Decreased activity tolerance;Decreased safety awareness;Impaired balance (sitting and/or standing);Decreased knowledge  of use of DME or AE;Decreased knowledge of precautions;Pain      OT Treatment/Interventions: Self-care/ADL training;Therapeutic exercise;Therapeutic activities;Energy conservation;DME and/or AE instruction;Patient/family education;Balance training    OT Goals(Current goals can be found in the care plan section) Acute Rehab OT Goals Patient Stated Goal: to get stronger OT Goal Formulation: With patient Time For Goal Achievement: 01/19/23 Potential to Achieve Goals: Good ADL Goals Pt Will Perform Grooming: with supervision;standing Pt Will Perform Lower Body Dressing: with supervision;sit to/from stand Pt Will Transfer to Toilet: with supervision;ambulating Pt Will Perform Toileting - Clothing Manipulation and hygiene: with supervision;sit to/from stand  OT Frequency: Min 2X/week       AM-PAC OT "6 Clicks" Daily Activity     Outcome Measure Help from another person eating meals?: None Help from another person taking care of personal grooming?: A Little Help from another person toileting, which includes using toliet, bedpan, or urinal?: A Lot Help from another person bathing (including washing, rinsing, drying)?: A Lot Help from another person to put on and taking off regular upper body clothing?: A Little Help from another person to put on and taking off regular lower body clothing?: A Lot 6 Click Score: 16   End of Session Equipment Utilized During Treatment: Rolling walker (2 wheels)  Nurse Communication: Mobility status  Activity Tolerance: Patient tolerated treatment well Patient left: in bed;with call bell/phone within reach;with bed alarm set  OT Visit Diagnosis: Unsteadiness on feet (R26.81);Repeated falls (R29.6);Muscle weakness (generalized) (M62.81);History of falling (Z91.81)                Time: 1020-1101 OT Time Calculation (min): 41 min Charges:  OT General Charges $OT Visit: 1 Visit OT Evaluation $OT Eval Moderate Complexity: 1 Mod OT Treatments $Self Care/Home  Management : 23-37 mins  Darleen Crocker, MS, OTR/L , CBIS ascom [phone]  01/05/23, 1:13 PM

## 2023-01-05 NOTE — Evaluation (Signed)
Physical Therapy Evaluation Patient Details Name: Helen Ramsey MRN: [id number] DOB: December 03, 1932 Today's Date: 01/05/2023  History of Present Illness  Pt is an 89 y.o. female presenting to hospital 01/04/23 s/p mechanical fall.  Pt admitted with ground-level fall, 66m subdural hematoma, C3 cervical fx, T2 compression fx, and hypokalemia. PMH includes HFpEF, CVA, htn, DDD, chronic O2 use, R TKA, R RCR, CTR.  Clinical Impression  Prior to hospital admission, pt was ambulatory with 4ww; uses 2 L chronic O2; lives alone in 1 level home with ramp to enter.  Currently pt is mod assist with bed mobility via logrolling; good sitting balance; and unable to stand with 1 assist (and bed height elevated) d/t upper back and neck pain (8/10--nurse notified of pt's request for pain meds).  Pt initially requesting to use cervical collar for comfort (so therapist assisted pt with donning brace) but then pt reporting too much discomfort with brace on and requested brace be taken off (therapist assisted with doffing brace).  Pt would benefit from skilled PT to address noted impairments and functional limitations (see below for any additional details).  Upon hospital discharge, pt would benefit from SNF.    Recommendations for follow up therapy are one component of a multi-disciplinary discharge planning process, led by the attending physician.  Recommendations may be updated based on patient status, additional functional criteria and insurance authorization.  Follow Up Recommendations Skilled nursing-short term rehab (<3 hours/day) Can patient physically be transported by private vehicle: No    Assistance Recommended at Discharge Frequent or constant Supervision/Assistance  Patient can return home with the following  Two people to help with walking and/or transfers;A lot of help with bathing/dressing/bathroom;Assistance with cooking/housework;Assist for transportation;Help with stairs or ramp for entrance     Equipment Recommendations Rolling walker (2 wheels);BSC/3in1;Wheelchair (measurements PT);Wheelchair cushion (measurements PT)  Recommendations for Other Services       Functional Status Assessment Patient has had a recent decline in their functional status and demonstrates the ability to make significant improvements in function in a reasonable and predictable amount of time.     Precautions / Restrictions Precautions Precautions: Back;Cervical Precaution Comments: precautions for comfort Required Braces or Orthoses: Cervical Brace Cervical Brace: Hard collar;For comfort Restrictions Weight Bearing Restrictions: No Other Position/Activity Restrictions: Per neurosurgery note 2/19: "She is fine to begin mobilizing with PT and OT.  She does not require a brace for stabilization. The fracture at C3 is stable - she can wear a brace if she would like and it is helpful, but it is not necessary for stability."      Mobility  Bed Mobility Overal bed mobility: Needs Assistance Bed Mobility: Rolling, Sidelying to Sit, Sit to Sidelying Rolling: Mod assist Sidelying to sit: Mod assist     Sit to sidelying: Mod assist General bed mobility comments: vc's for logrolling; assist for trunk and B LE's    Transfers Overall transfer level: Needs assistance Equipment used: Rolling walker (2 wheels) Transfers: Sit to/from Stand Sit to Stand: From elevated surface           General transfer comment: pt unable to stand with 1 assist and bed height elevated d/t c/o upper back and neck pain    Ambulation/Gait               General Gait Details: unable to stand to attempt  Stairs            Wheelchair Mobility    Modified Rankin (Stroke Patients Only)  Balance Overall balance assessment: Needs assistance Sitting-balance support: No upper extremity supported, Feet supported Sitting balance-Leahy Scale: Fair Sitting balance - Comments: steady static sitting                                      Pertinent Vitals/Pain Pain Assessment Pain Assessment: 0-10 Pain Score: 8  Pain Location: upper back and neck Pain Descriptors / Indicators: Discomfort, Aching, Tender, Sore Pain Intervention(s): Limited activity within patient's tolerance, Monitored during session, Repositioned, Patient requesting pain meds-RN notified Vitals (HR and O2 on 2 L via nasal cannula) stable and WFL throughout treatment session.    Home Living Family/patient expects to be discharged to:: Private residence Living Arrangements: Alone Available Help at Discharge: Family;Available PRN/intermittently;Personal care attendant (PCA 1x/week for 2 hours) Type of Home: Apartment Home Access: Ramped entrance       Home Layout: One level Home Equipment: Rollator (4 wheels);Tub bench Additional Comments: 2 L O2 baseline    Prior Function Prior Level of Function : Independent/Modified Independent             Mobility Comments: H/o multiple falls.  Ambulatory modified independent with 4ww. ADLs Comments: Per OT eval "She has PCA 1x/wk for 2 hours to assist with shower and/or IADLs. Pt does not drive."     Hand Dominance   Dominant Hand: Right    Extremity/Trunk Assessment   Upper Extremity Assessment Upper Extremity Assessment: Generalized weakness    Lower Extremity Assessment Lower Extremity Assessment: Generalized weakness    Cervical / Trunk Assessment Cervical / Trunk Assessment: Kyphotic;Other exceptions Cervical / Trunk Exceptions: forward head/neck  Communication   Communication: No difficulties  Cognition Arousal/Alertness: Awake/alert Behavior During Therapy: WFL for tasks assessed/performed Overall Cognitive Status: Within Functional Limits for tasks assessed                                          General Comments  Nursing cleared pt for participation in physical therapy.  Pt agreeable to PT session.    Exercises  Bed  mobility and transfer training   Assessment/Plan    PT Assessment Patient needs continued PT services  PT Problem List Decreased strength;Decreased activity tolerance;Decreased balance;Decreased mobility;Decreased knowledge of use of DME;Decreased knowledge of precautions;Pain       PT Treatment Interventions DME instruction;Gait training;Functional mobility training;Therapeutic activities;Therapeutic exercise;Balance training;Patient/family education    PT Goals (Current goals can be found in the Care Plan section)  Acute Rehab PT Goals Patient Stated Goal: to improve pain and mobility PT Goal Formulation: With patient Time For Goal Achievement: 01/19/23 Potential to Achieve Goals: Good    Frequency Min 2X/week     Co-evaluation               AM-PAC PT "6 Clicks" Mobility  Outcome Measure Help needed turning from your back to your side while in a flat bed without using bedrails?: A Lot Help needed moving from lying on your back to sitting on the side of a flat bed without using bedrails?: A Lot Help needed moving to and from a bed to a chair (including a wheelchair)?: Total Help needed standing up from a chair using your arms (e.g., wheelchair or bedside chair)?: Total Help needed to walk in hospital room?: Total Help needed climbing 3-5 steps with a railing? :  Total 6 Click Score: 8    End of Session Equipment Utilized During Treatment: Gait belt;Cervical collar Activity Tolerance: Patient limited by pain Patient left: in bed;with call bell/phone within reach;with bed alarm set;with nursing/sitter in room Nurse Communication: Mobility status;Precautions;Patient requests pain meds PT Visit Diagnosis: Other abnormalities of gait and mobility (R26.89);Muscle weakness (generalized) (M62.81);History of falling (Z91.81);Pain    Time: [id number] PT Time Calculation (min) (ACUTE ONLY): 25 min   Charges:   PT Evaluation $PT Eval Low Complexity: 1 Low PT  Treatments $Therapeutic Activity: 8-22 mins       Leitha Bleak, PT 01/05/23, 3:19 PM

## 2023-01-06 DIAGNOSIS — S12200D Unspecified displaced fracture of third cervical vertebra, subsequent encounter for fracture with routine healing: Secondary | ICD-10-CM | POA: Diagnosis not present

## 2023-01-06 DIAGNOSIS — K5190 Ulcerative colitis, unspecified, without complications: Secondary | ICD-10-CM | POA: Diagnosis not present

## 2023-01-06 DIAGNOSIS — Z7401 Bed confinement status: Secondary | ICD-10-CM | POA: Diagnosis not present

## 2023-01-06 DIAGNOSIS — S22020D Wedge compression fracture of second thoracic vertebra, subsequent encounter for fracture with routine healing: Secondary | ICD-10-CM | POA: Diagnosis not present

## 2023-01-06 DIAGNOSIS — Z66 Do not resuscitate: Secondary | ICD-10-CM | POA: Diagnosis present

## 2023-01-06 DIAGNOSIS — S12291S Other nondisplaced fracture of third cervical vertebra, sequela: Secondary | ICD-10-CM | POA: Diagnosis not present

## 2023-01-06 DIAGNOSIS — S0001XA Abrasion of scalp, initial encounter: Secondary | ICD-10-CM | POA: Diagnosis present

## 2023-01-06 DIAGNOSIS — W010XXA Fall on same level from slipping, tripping and stumbling without subsequent striking against object, initial encounter: Secondary | ICD-10-CM | POA: Diagnosis present

## 2023-01-06 DIAGNOSIS — R519 Headache, unspecified: Secondary | ICD-10-CM | POA: Diagnosis present

## 2023-01-06 DIAGNOSIS — E876 Hypokalemia: Secondary | ICD-10-CM | POA: Diagnosis present

## 2023-01-06 DIAGNOSIS — L89152 Pressure ulcer of sacral region, stage 2: Secondary | ICD-10-CM | POA: Diagnosis present

## 2023-01-06 DIAGNOSIS — Y92481 Parking lot as the place of occurrence of the external cause: Secondary | ICD-10-CM | POA: Diagnosis not present

## 2023-01-06 DIAGNOSIS — I5030 Unspecified diastolic (congestive) heart failure: Secondary | ICD-10-CM | POA: Diagnosis not present

## 2023-01-06 DIAGNOSIS — J309 Allergic rhinitis, unspecified: Secondary | ICD-10-CM | POA: Diagnosis not present

## 2023-01-06 DIAGNOSIS — F418 Other specified anxiety disorders: Secondary | ICD-10-CM | POA: Diagnosis not present

## 2023-01-06 DIAGNOSIS — I130 Hypertensive heart and chronic kidney disease with heart failure and stage 1 through stage 4 chronic kidney disease, or unspecified chronic kidney disease: Secondary | ICD-10-CM | POA: Diagnosis present

## 2023-01-06 DIAGNOSIS — G2581 Restless legs syndrome: Secondary | ICD-10-CM | POA: Diagnosis not present

## 2023-01-06 DIAGNOSIS — S065X0D Traumatic subdural hemorrhage without loss of consciousness, subsequent encounter: Secondary | ICD-10-CM | POA: Diagnosis not present

## 2023-01-06 DIAGNOSIS — E669 Obesity, unspecified: Secondary | ICD-10-CM | POA: Insufficient documentation

## 2023-01-06 DIAGNOSIS — M5136 Other intervertebral disc degeneration, lumbar region: Secondary | ICD-10-CM | POA: Diagnosis not present

## 2023-01-06 DIAGNOSIS — S065X0A Traumatic subdural hemorrhage without loss of consciousness, initial encounter: Secondary | ICD-10-CM | POA: Diagnosis present

## 2023-01-06 DIAGNOSIS — T502X5A Adverse effect of carbonic-anhydrase inhibitors, benzothiadiazides and other diuretics, initial encounter: Secondary | ICD-10-CM | POA: Diagnosis present

## 2023-01-06 DIAGNOSIS — R001 Bradycardia, unspecified: Secondary | ICD-10-CM | POA: Diagnosis present

## 2023-01-06 DIAGNOSIS — M5481 Occipital neuralgia: Secondary | ICD-10-CM | POA: Diagnosis not present

## 2023-01-06 DIAGNOSIS — I5032 Chronic diastolic (congestive) heart failure: Secondary | ICD-10-CM | POA: Diagnosis present

## 2023-01-06 DIAGNOSIS — M549 Dorsalgia, unspecified: Secondary | ICD-10-CM | POA: Diagnosis present

## 2023-01-06 DIAGNOSIS — R079 Chest pain, unspecified: Secondary | ICD-10-CM | POA: Diagnosis present

## 2023-01-06 DIAGNOSIS — S22020S Wedge compression fracture of second thoracic vertebra, sequela: Secondary | ICD-10-CM | POA: Diagnosis not present

## 2023-01-06 DIAGNOSIS — Z7982 Long term (current) use of aspirin: Secondary | ICD-10-CM | POA: Diagnosis not present

## 2023-01-06 DIAGNOSIS — W19XXXA Unspecified fall, initial encounter: Secondary | ICD-10-CM | POA: Diagnosis not present

## 2023-01-06 DIAGNOSIS — S12201A Unspecified nondisplaced fracture of third cervical vertebra, initial encounter for closed fracture: Secondary | ICD-10-CM | POA: Diagnosis present

## 2023-01-06 DIAGNOSIS — M810 Age-related osteoporosis without current pathological fracture: Secondary | ICD-10-CM | POA: Diagnosis present

## 2023-01-06 DIAGNOSIS — Y9301 Activity, walking, marching and hiking: Secondary | ICD-10-CM | POA: Diagnosis present

## 2023-01-06 DIAGNOSIS — R54 Age-related physical debility: Secondary | ICD-10-CM | POA: Diagnosis present

## 2023-01-06 DIAGNOSIS — Z96651 Presence of right artificial knee joint: Secondary | ICD-10-CM | POA: Diagnosis not present

## 2023-01-06 DIAGNOSIS — Z9981 Dependence on supplemental oxygen: Secondary | ICD-10-CM | POA: Diagnosis not present

## 2023-01-06 DIAGNOSIS — S065XAA Traumatic subdural hemorrhage with loss of consciousness status unknown, initial encounter: Secondary | ICD-10-CM | POA: Diagnosis not present

## 2023-01-06 DIAGNOSIS — Z8673 Personal history of transient ischemic attack (TIA), and cerebral infarction without residual deficits: Secondary | ICD-10-CM | POA: Diagnosis not present

## 2023-01-06 DIAGNOSIS — M1990 Unspecified osteoarthritis, unspecified site: Secondary | ICD-10-CM | POA: Diagnosis not present

## 2023-01-06 DIAGNOSIS — E785 Hyperlipidemia, unspecified: Secondary | ICD-10-CM | POA: Diagnosis present

## 2023-01-06 DIAGNOSIS — S0003XA Contusion of scalp, initial encounter: Secondary | ICD-10-CM | POA: Diagnosis present

## 2023-01-06 DIAGNOSIS — S22020A Wedge compression fracture of second thoracic vertebra, initial encounter for closed fracture: Secondary | ICD-10-CM | POA: Diagnosis present

## 2023-01-06 DIAGNOSIS — N1832 Chronic kidney disease, stage 3b: Secondary | ICD-10-CM | POA: Diagnosis present

## 2023-01-06 DIAGNOSIS — S22030A Wedge compression fracture of third thoracic vertebra, initial encounter for closed fracture: Secondary | ICD-10-CM | POA: Diagnosis not present

## 2023-01-06 DIAGNOSIS — J9611 Chronic respiratory failure with hypoxia: Secondary | ICD-10-CM | POA: Diagnosis present

## 2023-01-06 DIAGNOSIS — G4700 Insomnia, unspecified: Secondary | ICD-10-CM | POA: Diagnosis not present

## 2023-01-06 DIAGNOSIS — K5900 Constipation, unspecified: Secondary | ICD-10-CM | POA: Diagnosis not present

## 2023-01-06 DIAGNOSIS — Z6832 Body mass index (BMI) 32.0-32.9, adult: Secondary | ICD-10-CM | POA: Diagnosis not present

## 2023-01-06 DIAGNOSIS — I700 Atherosclerosis of aorta: Secondary | ICD-10-CM | POA: Diagnosis present

## 2023-01-06 DIAGNOSIS — I959 Hypotension, unspecified: Secondary | ICD-10-CM | POA: Diagnosis not present

## 2023-01-06 DIAGNOSIS — W19XXXD Unspecified fall, subsequent encounter: Secondary | ICD-10-CM | POA: Diagnosis not present

## 2023-01-06 DIAGNOSIS — E559 Vitamin D deficiency, unspecified: Secondary | ICD-10-CM | POA: Diagnosis not present

## 2023-01-06 DIAGNOSIS — S22030D Wedge compression fracture of third thoracic vertebra, subsequent encounter for fracture with routine healing: Secondary | ICD-10-CM | POA: Diagnosis not present

## 2023-01-06 LAB — BASIC METABOLIC PANEL
Anion gap: 9 (ref 5–15)
BUN: 49 mg/dL — ABNORMAL HIGH (ref 8–23)
CO2: 31 mmol/L (ref 22–32)
Calcium: 9.3 mg/dL (ref 8.9–10.3)
Chloride: 97 mmol/L — ABNORMAL LOW (ref 98–111)
Creatinine, Ser: 1.58 mg/dL — ABNORMAL HIGH (ref 0.44–1.00)
GFR, Estimated: 31 mL/min — ABNORMAL LOW (ref >60)
Glucose, Bld: 115 mg/dL — ABNORMAL HIGH (ref 70–99)
Potassium: 3.8 mmol/L (ref 3.5–5.1)
Sodium: 137 mmol/L (ref 135–145)

## 2023-01-06 LAB — MAGNESIUM: Magnesium: 2.3 mg/dL (ref 1.7–2.4)

## 2023-01-06 LAB — PHOSPHORUS: Phosphorus: 4 mg/dL (ref 2.5–4.6)

## 2023-01-06 MED ORDER — METHOTREXATE SODIUM 2.5 MG PO TABS
10.0000 mg | ORAL_TABLET | ORAL | Status: DC
  Administered 2023-01-07: 10 mg via ORAL
  Filled 2023-01-06: qty 4

## 2023-01-06 MED ORDER — FOLIC ACID 1 MG PO TABS
1.0000 mg | ORAL_TABLET | Freq: Every day | ORAL | Status: DC
  Administered 2023-01-06 – 2023-01-09 (×4): 1 mg via ORAL
  Filled 2023-01-06 (×4): qty 1

## 2023-01-06 NOTE — Plan of Care (Signed)
  Problem: Education: Goal: Knowledge of General Education information will improve Description: Including pain rating scale, medication(s)/side effects and non-pharmacologic comfort measures Outcome: Progressing   Problem: Elimination: Goal: Will not experience complications related to bowel motility Outcome: Progressing   Problem: Safety: Goal: Ability to remain free from injury will improve Outcome: Progressing   Problem: Pain Managment: Goal: General experience of comfort will improve Outcome: Progressing   Problem: Activity: Goal: Risk for activity intolerance will decrease Outcome: Progressing

## 2023-01-06 NOTE — Assessment & Plan Note (Signed)
Present on admission, see full description below.

## 2023-01-06 NOTE — Assessment & Plan Note (Signed)
BMI 32.52 with current height and weight in computer.

## 2023-01-06 NOTE — Progress Notes (Signed)
Progress Note   Patient: Helen Ramsey [id number] DOB: 1933-07-18 DOA: 01/04/2023     0 DOS: the patient was seen and examined on 01/06/2023   Brief hospital course: 89 y.o. female with medical history significant of chronic hypoxic respiratory failure on 2 L of supplemental oxygen, HFpEF, osteoporosis, CKD stage IIIb, morphea on methotrexate, who presents to the ED with complaints of a fall.   [name] states she was feeling well today and decided to go to church.  When she was getting out of her car using her walker and starting to walk up the ramp, her walker hit a bump and the oxygen concentrator began to fall.  She tried to catch it when she stood back up, she fell backwards and hit her head against the car.  Her oxygen concentrator hit her in the chest and she subsequently fell to the ground.  She denies any loss of consciousness.  After the fall, she was experiencing head pain, chest pain and back pain.  She denies any shortness of breath or palpitations.  She denies any recent illness, including fever, chills, nausea, vomiting, diarrhea, abdominal pain.   ED course: On arrival to the ED, patient was normotensive at 129/64 with heart rate of 65.  She was saturating at 98% on her home 2 L.  Initial workup remarkable for WBC of 8.6, hemoglobin of 10.3, sodium of 132, potassium of 2.8, glucose 139, BUN 39, creatinine of 1.56 and GFR of 32.  Chest x-ray was obtained that demonstrated mild vascular congestion.  Hip x-ray was obtained that did not show any acute findings.  CT of the head, C-spine, L-spine and T-spine were obtained.  CT head demonstrated 4 mm subdural hematoma.  CT of the C-spine demonstrated acute C3 fracture and T-spine demonstrated compression T2 fracture. Neurosurgery was consulted.  Patient placed in C-spine collar.  TRH consulted for admission.  2/19.  Case discussed with neurosurgery and okay to work with PT and OT.  OT and PT recommending rehab 2/20.  Patient  agreeable to go out to rehab.  Assessment and Plan: * Subdural hematoma (HCC) Repeat CT scan shows a stable subdural hematoma.  Cleared by neurosurgery for disposition.  C3 cervical fracture (HCC) C3 stable fracture.  As per neurosurgery okay to wear collar for comfort.  Patient interested in trying a soft collar.  Compression fracture of T2 vertebra (HCC) Continue pain management.  Hypokalemia Replaced  Chronic respiratory failure with hypoxia (HCC) Chronically on 2 L of oxygen  (HFpEF) heart failure with preserved ejection fraction [facility]) Patient is euvolemic at this time.  Last EF 60%. Continue torsemide 40 mg twice a day.  No beta-blocker with bradycardia.  Hold Zaroxolyn today.  Stage 3b chronic kidney disease ([place]) Today's creatinine 1.58 with a GFR of 31.  Holding Zaroxolyn today.  Sinus bradycardia Per chart review, patient's heart rate has been low at previous office visits, consistent with chronic sinus bradycardia.  No symptoms reported at this time.  Obesity (BMI 30-39.9) BMI 32.52 with current height and weight in computer.  Decubitus ulcer of coccyx, stage II ([place]) Present on admission, see full description below.        Subjective: Patient still feeling weak and is agreeable to rehab since she lives alone.  Has some soreness in her chest and thinks her oxygen tank may have landed on her along with the walker when she fell.  Physical Exam: Vitals:   01/05/23 1449 01/05/23 2301 01/06/23 0800 01/06/23 0950  BP: Marland Kitchen)  145/52 (!) 147/55 (!) 150/60 (!) 130/50  Pulse: (!) 51 (!) 55 (!) 50 (!) 57  Resp: 18 16 16 16  $ Temp: 97.8 F (36.6 C) 98.1 F (36.7 C) (!) 97.5 F (36.4 C) (!) 97.5 F (36.4 C)  TempSrc:   Oral Oral  SpO2: 97% 96% 91% 94%  Weight:      Height:       Physical Exam HENT:     Head: Normocephalic.     Mouth/Throat:     Pharynx: No oropharyngeal exudate.  Eyes:     General: Lids are normal.     Conjunctiva/sclera: Conjunctivae normal.   Cardiovascular:     Rate and Rhythm: Normal rate and regular rhythm.     Heart sounds: Normal heart sounds, S1 normal and S2 normal.  Pulmonary:     Breath sounds: No decreased breath sounds, wheezing, rhonchi or rales.  Abdominal:     Palpations: Abdomen is soft.     Tenderness: There is no abdominal tenderness.  Musculoskeletal:     Right lower leg: No swelling.     Left lower leg: No swelling.  Skin:    General: Skin is warm.     Findings: No rash.  Neurological:     Mental Status: She is alert and oriented to person, place, and time.     Comments: Able to straight leg raise bilaterally.  Power 4+ out of 5 upper and lower extremities.     Data Reviewed: Creatinine 1.58 with a BUN of 49  Family Communication: Spoke with son at the bedside  Disposition: Status is: Inpatient Remains inpatient appropriate because: TOC looking into rehab options.  Medically stable once we do find an option.  Planned Discharge Destination: Rehab    Time spent: 28 minutes  Author: Loletha Grayer, MD 01/06/2023 12:48 PM  For on call review [email or web address].

## 2023-01-06 NOTE — Progress Notes (Signed)
Occupational Therapy Treatment Patient Details Name: Helen Ramsey MRN: [id number] DOB: [birth year] Today's Date: 01/06/2023   History of present illness Pt is an 89 y.o. female presenting to hospital 01/04/23 s/p mechanical fall.  Pt admitted with ground-level fall, 81m subdural hematoma, C3 cervical fx, T2 compression fx, and hypokalemia. PMH includes HFpEF, CVA, htn, DDD, chronic O2 use, R TKA, R RCR, CTR.   OT comments  Upon entering the room, pt having just returned to bed with RN and NT assistance. Pt getting pain medication and reports 10/10 pain. She wants assistance with repositioning in bed requiring mod A but declined OOB and EOB secondary to increased pain. Pt attempting figure four position in bed to doff B socks. Pt is successful with L foot but needing assistance to remove the R socks. OT asking pt to perform ankle pumps and glute squeezes while in bed and pt performs x10 reps each. Pt requesting to rest at this time secondary to fatigue and increased pain. OT continues to recommend SNF as pt lives at home alone at baseline and needs assistance for functional mobility and self care.    Recommendations for follow up therapy are one component of a multi-disciplinary discharge planning process, led by the attending physician.  Recommendations may be updated based on patient status, additional functional criteria and insurance authorization.    Follow Up Recommendations  Skilled nursing-short term rehab (<3 hours/day)     Assistance Recommended at Discharge Frequent or constant Supervision/Assistance  Patient can return home with the following  A lot of help with walking and/or transfers;A lot of help with bathing/dressing/bathroom;Help with stairs or ramp for entrance;Assist for transportation;Assistance with cooking/housework   Equipment Recommendations  Other (comment) (defer to next venue of care)       Precautions / Restrictions Precautions Precautions:  Back;Cervical Precaution Comments: precautions for comfort Required Braces or Orthoses: Cervical Brace Cervical Brace: Hard collar;For comfort Restrictions Weight Bearing Restrictions: No Other Position/Activity Restrictions: Per neurosurgery note 2/19: "She is fine to begin mobilizing with PT and OT.  She does not require a brace for stabilization. The fracture at C3 is stable - she can wear a brace if she would like and it is helpful, but it is not necessary for stability."              ADL either performed or assessed with clinical judgement   ADL Overall ADL's : Needs assistance/impaired                                       General ADL Comments: figure four position to don/doff socks in bed with pt needing assistance for R LE.    Extremity/Trunk Assessment Upper Extremity Assessment Upper Extremity Assessment: Generalized weakness   Lower Extremity Assessment Lower Extremity Assessment: Generalized weakness   Cervical / Trunk Assessment Cervical / Trunk Assessment: Kyphotic;Other exceptions Cervical / Trunk Exceptions: forward head/neck    Vision Patient Visual Report: No change from baseline            Cognition Arousal/Alertness: Awake/alert Behavior During Therapy: WFL for tasks assessed/performed Overall Cognitive Status: Within Functional Limits for tasks assessed  Frequency  Min 2X/week        Progress Toward Goals  OT Goals(current goals can now be found in the care plan section)  Progress towards OT goals: Progressing toward goals     Plan Discharge plan remains appropriate;Frequency remains appropriate       AM-PAC OT "6 Clicks" Daily Activity     Outcome Measure   Help from another person eating meals?: None Help from another person taking care of personal grooming?: A Little Help from another person toileting, which includes using toliet, bedpan, or  urinal?: A Lot Help from another person bathing (including washing, rinsing, drying)?: A Lot Help from another person to put on and taking off regular upper body clothing?: A Little Help from another person to put on and taking off regular lower body clothing?: A Lot 6 Click Score: 16    End of Session    OT Visit Diagnosis: Unsteadiness on feet (R26.81);Repeated falls (R29.6);Muscle weakness (generalized) (M62.81);History of falling (Z91.81)   Activity Tolerance Patient limited by pain   Patient Left in bed;with call bell/phone within reach;with bed alarm set           Time: [id number] OT Time Calculation (min): 12 min  Charges: OT General Charges $OT Visit: 1 Visit OT Treatments $Therapeutic Activity: 8-22 mins  Darleen Crocker, MS, OTR/L , CBIS ascom [phone]  01/06/23, 3:50 PM

## 2023-01-07 DIAGNOSIS — S065XAA Traumatic subdural hemorrhage with loss of consciousness status unknown, initial encounter: Secondary | ICD-10-CM | POA: Diagnosis not present

## 2023-01-07 DIAGNOSIS — S22020A Wedge compression fracture of second thoracic vertebra, initial encounter for closed fracture: Secondary | ICD-10-CM | POA: Diagnosis not present

## 2023-01-07 DIAGNOSIS — J9611 Chronic respiratory failure with hypoxia: Secondary | ICD-10-CM | POA: Diagnosis not present

## 2023-01-07 DIAGNOSIS — S12201A Unspecified nondisplaced fracture of third cervical vertebra, initial encounter for closed fracture: Secondary | ICD-10-CM | POA: Diagnosis not present

## 2023-01-07 NOTE — Plan of Care (Signed)

## 2023-01-07 NOTE — Progress Notes (Signed)
Occupational Therapy Treatment Patient Details Name: Helen Ramsey MRN: [id number] DOB: 1933/10/07 Today's Date: 01/07/2023   History of present illness Pt is an 89 y.o. female presenting to hospital 01/04/23 s/p mechanical fall.  Pt admitted with ground-level fall, 55m subdural hematoma, C3 cervical fx, T2 compression fx, and hypokalemia. PMH includes HFpEF, CVA, htn, DDD, chronic O2 use, R TKA, R RCR, CTR.   OT comments  Chart reviewed, pt greeted on commode agreeable to OT tx session. Tx session targeted improving functional activity tolerance in the setting of ADL tasks. Improvements noted in STS, in room amb. Pt also with improvements noted in bathing tasks requiring MOD A. Pt continues to perform below PLOF, however and would continue to benefit from discharge to STR to address deficits. OT will continue to follow acutely.    Recommendations for follow up therapy are one component of a multi-disciplinary discharge planning process, led by the attending physician.  Recommendations may be updated based on patient status, additional functional criteria and insurance authorization.    Follow Up Recommendations  Skilled nursing-short term rehab (<3 hours/day)     Assistance Recommended at Discharge Frequent or constant Supervision/Assistance  Patient can return home with the following  A lot of help with walking and/or transfers;A lot of help with bathing/dressing/bathroom;Help with stairs or ramp for entrance;Assist for transportation;Assistance with cooking/housework   Equipment Recommendations  Other (comment) (per next venue of care)    Recommendations for Other Services      Precautions / Restrictions Precautions Precautions: Cervical;Back Precaution Comments: precautions for comfort Required Braces or Orthoses: Cervical Brace Cervical Brace: Hard collar;For comfort Restrictions Weight Bearing Restrictions: No Other Position/Activity Restrictions: Per neurosurgery note 2/19:  "She is fine to begin mobilizing with PT and OT.  She does not require a brace for stabilization. The fracture at C3 is stable - she can wear a brace if she would like and it is helpful, but it is not necessary for stability."       Mobility Bed Mobility               General bed mobility comments: NT on commode prior to session, in beside chair post session    Transfers Overall transfer level: Needs assistance Equipment used: Rollator (4 wheels) (personal rollator) Transfers: Sit to/from Stand                   Balance Overall balance assessment: Needs assistance Sitting-balance support: Feet supported Sitting balance-Leahy Scale: Good     Standing balance support: Reliant on assistive device for balance, During functional activity, Bilateral upper extremity supported Standing balance-Leahy Scale: Fair                             ADL either performed or assessed with clinical judgement   ADL Overall ADL's : Needs assistance/impaired         Upper Body Bathing: Moderate assistance;Sitting;Standing   Lower Body Bathing: Moderate assistance;Sit to/from stand   Upper Body Dressing : Minimal assistance;Standing   Lower Body Dressing: Minimal assistance Lower Body Dressing Details (indicate cue type and reason): doff underwear Toilet Transfer: Min guard;Minimal assistance;Rollator (4 wheels)   Toileting- Clothing Manipulation and Hygiene: Moderate assistance;Sit to/from stand       Functional mobility during ADLs: Min guard;Rollator (4 wheels) (approx 15' in room)      Extremity/Trunk Assessment              Vision  Perception     Praxis      Cognition Arousal/Alertness: Awake/alert Behavior During Therapy: WFL for tasks assessed/performed Overall Cognitive Status: Within Functional Limits for tasks assessed                                          Exercises      Shoulder Instructions       General  Comments      Pertinent Vitals/ Pain       Pain Assessment Pain Assessment: No/denies pain  Home Living                                          Prior Functioning/Environment              Frequency  Min 2X/week        Progress Toward Goals  OT Goals(current goals can now be found in the care plan section)  Progress towards OT goals: Progressing toward goals     Plan Discharge plan remains appropriate;Frequency remains appropriate    Co-evaluation                 AM-PAC OT "6 Clicks" Daily Activity     Outcome Measure   Help from another person eating meals?: None Help from another person taking care of personal grooming?: A Little Help from another person toileting, which includes using toliet, bedpan, or urinal?: A Lot Help from another person bathing (including washing, rinsing, drying)?: A Lot Help from another person to put on and taking off regular upper body clothing?: A Little Help from another person to put on and taking off regular lower body clothing?: A Little 6 Click Score: 17    End of Session Equipment Utilized During Treatment: Rollator (4 wheels)  OT Visit Diagnosis: Unsteadiness on feet (R26.81);Repeated falls (R29.6);Muscle weakness (generalized) (M62.81);History of falling (Z91.81)   Activity Tolerance Patient tolerated treatment well   Patient Left in chair;with call bell/phone within reach;with chair alarm set   Nurse Communication Mobility status        Time: [id number] OT Time Calculation (min): 15 min  Charges: OT General Charges $OT Visit: 1 Visit OT Treatments $Self Care/Home Management : 8-22 mins  Shanon Payor, OTD OTR/L  01/07/23, 12:41 PM

## 2023-01-07 NOTE — TOC Progression Note (Signed)
Transition of Care [facility]) - Progression Note    Patient Details  Name: Helen Ramsey MRN: [id number] Date of Birth: 09-04-1933  Transition of Care [facility]) CM/SW Contact  Laurena Slimmer, RN Phone Number: 01/07/2023, 12:58 PM  Clinical Narrative:    Spoke with patient's son, [name] regarding therapy recommendation for SNF. He and family are agreeable to SNF. They prefer local SNF's. He was provided information for the facilities in [facility]. [name] does not have a preference however would like prefer [facility].  [place] PASSAR retrieved Bed search started         Expected Discharge Plan and Services                                               Social Determinants of Health (SDOH) Interventions SDOH Screenings   Food Insecurity: No Food Insecurity (01/05/2023)  Housing: Low Risk  (01/05/2023)  Transportation Needs: No Transportation Needs (01/05/2023)  Utilities: Not At Risk (01/05/2023)  Alcohol Screen: Low Risk  (12/18/2022)  Depression (PHQ2-9): Medium Risk (12/18/2022)  Financial Resource Strain: Low Risk  (05/06/2022)  Physical Activity: Insufficiently Active (05/06/2022)  Social Connections: Moderately Isolated (05/06/2022)  Stress: No Stress Concern Present (05/06/2022)  Tobacco Use: Low Risk  (01/04/2023)    Readmission Risk Interventions     No data to display

## 2023-01-07 NOTE — Progress Notes (Signed)
Physical Therapy Treatment Patient Details Name: Helen Ramsey MRN: [id number] DOB: 02/20/1933 Today's Date: 01/07/2023   History of Present Illness Pt is an 89 y.o. female presenting to hospital 01/04/23 s/p mechanical fall.  Pt admitted with ground-level fall, 43m subdural hematoma, C3 cervical fx, T2 compression fx, and hypokalemia. PMH includes HFpEF, CVA, htn, DDD, chronic O2 use, R TKA, R RCR, CTR.    PT Comments    Patient received in bed, reports she needs to go to the bathroom. Son present in room. She required min A for supine to sit. Min guard for sit to stand from slightly elevated bed. Mod A from low toilet in bathroom. She is able to ambulate 25 feet in room with min guard. Patient is making good progress but will continue to benefit from skilled PT to improve strength and functional independence.       Recommendations for follow up therapy are one component of a multi-disciplinary discharge planning process, led by the attending physician.  Recommendations may be updated based on patient status, additional functional criteria and insurance authorization.  Follow Up Recommendations  Skilled nursing-short term rehab (<3 hours/day) Can patient physically be transported by private vehicle: No   Assistance Recommended at Discharge Frequent or constant Supervision/Assistance  Patient can return home with the following A little help with walking and/or transfers;A lot of help with bathing/dressing/bathroom;Help with stairs or ramp for entrance;Assist for transportation   Equipment Recommendations  None recommended by PT    Recommendations for Other Services       Precautions / Restrictions Precautions Precautions: Cervical;Back Precaution Comments: precautions for comfort Required Braces or Orthoses: Cervical Brace Cervical Brace: Hard collar;For comfort Restrictions Weight Bearing Restrictions: No Other Position/Activity Restrictions: Per neurosurgery note 2/19: "She is  fine to begin mobilizing with PT and OT.  She does not require a brace for stabilization. The fracture at C3 is stable - she can wear a brace if she would like and it is helpful, but it is not necessary for stability."     Mobility  Bed Mobility Overal bed mobility: Needs Assistance Bed Mobility: Supine to Sit     Supine to sit: Min guard          Transfers Overall transfer level: Needs assistance Equipment used: Rolling walker (2 wheels) Transfers: Sit to/from Stand Sit to Stand: Min guard, From elevated surface, Min assist (min/mod A from low toilet)                Ambulation/Gait Ambulation/Gait assistance: Min guard Gait Distance (Feet): 25 Feet Assistive device: Rollator (4 wheels) Gait Pattern/deviations: Step-through pattern, Decreased step length - right, Decreased step length - left, Decreased stride length, Trunk flexed Gait velocity: decreased     General Gait Details: patient steady with ambulation   Stairs             Wheelchair Mobility    Modified Rankin (Stroke Patients Only)       Balance Overall balance assessment: Needs assistance Sitting-balance support: Feet supported Sitting balance-Leahy Scale: Good Sitting balance - Comments: steady static sitting   Standing balance support: Reliant on assistive device for balance, During functional activity, Bilateral upper extremity supported Standing balance-Leahy Scale: Fair                              Cognition Arousal/Alertness: Awake/alert Behavior During Therapy: WFL for tasks assessed/performed Overall Cognitive Status: Within Functional Limits for tasks assessed  General Comments: very pleasant and talkative        Exercises      General Comments        Pertinent Vitals/Pain Pain Assessment Pain Assessment: Faces Faces Pain Scale: Hurts little more Pain Location: chest (muscular) Pain Descriptors /  Indicators: Discomfort, Sore, Grimacing Pain Intervention(s): Monitored during session, Repositioned    Home Living                          Prior Function            PT Goals (current goals can now be found in the care plan section) Acute Rehab PT Goals Patient Stated Goal: to improve pain and mobility PT Goal Formulation: With patient Time For Goal Achievement: 01/19/23 Potential to Achieve Goals: Good Progress towards PT goals: Progressing toward goals    Frequency    Min 2X/week      PT Plan Current plan remains appropriate    Co-evaluation              AM-PAC PT "6 Clicks" Mobility   Outcome Measure  Help needed turning from your back to your side while in a flat bed without using bedrails?: A Little Help needed moving from lying on your back to sitting on the side of a flat bed without using bedrails?: A Little Help needed moving to and from a bed to a chair (including a wheelchair)?: A Little Help needed standing up from a chair using your arms (e.g., wheelchair or bedside chair)?: A Lot Help needed to walk in hospital room?: A Little Help needed climbing 3-5 steps with a railing? : A Lot 6 Click Score: 16    End of Session Equipment Utilized During Treatment: Gait belt Activity Tolerance: Patient tolerated treatment well Patient left: in chair;with call bell/phone within reach;with family/visitor present Nurse Communication: Mobility status PT Visit Diagnosis: Muscle weakness (generalized) (M62.81);History of falling (Z91.81);Pain;Difficulty in walking, not elsewhere classified (R26.2) Pain - Right/Left:  (chest)     Time: [id number] PT Time Calculation (min) (ACUTE ONLY): 33 min  Charges:  $Gait Training: 8-22 mins $Therapeutic Activity: 8-22 mins                     Darnise Montag, PT, GCS 01/07/23,10:50 AM

## 2023-01-07 NOTE — Progress Notes (Signed)
Progress Note   Patient: Helen Ramsey [id number] DOB: [birth year] DOA: 01/04/2023     1 DOS: the patient was seen and examined on 01/07/2023   Brief hospital course: 89 y.o. female with medical history significant of chronic hypoxic respiratory failure on 2 L of supplemental oxygen, HFpEF, osteoporosis, CKD stage IIIb, morphea on methotrexate, who presents to the ED with complaints of a fall.   Helen Ramsey states she was feeling well today and decided to go to church.  When she was getting out of her car using her walker and starting to walk up the ramp, her walker hit a bump and the oxygen concentrator began to fall.  She tried to catch it when she stood back up, she fell backwards and hit her head against the car.  Her oxygen concentrator hit her in the chest and she subsequently fell to the ground.  She denies any loss of consciousness.  After the fall, she was experiencing head pain, chest pain and back pain.  She denies any shortness of breath or palpitations.  She denies any recent illness, including fever, chills, nausea, vomiting, diarrhea, abdominal pain.   ED course: On arrival to the ED, patient was normotensive at 129/64 with heart rate of 65.  She was saturating at 98% on her home 2 L.  Initial workup remarkable for WBC of 8.6, hemoglobin of 10.3, sodium of 132, potassium of 2.8, glucose 139, BUN 39, creatinine of 1.56 and GFR of 32.  Chest x-ray was obtained that demonstrated mild vascular congestion.  Hip x-ray was obtained that did not show any acute findings.  CT of the head, C-spine, L-spine and T-spine were obtained.  CT head demonstrated 4 mm subdural hematoma.  CT of the C-spine demonstrated acute C3 fracture and T-spine demonstrated compression T2 fracture. Neurosurgery was consulted.  Patient placed in C-spine collar.  TRH consulted for admission.  2/19.  Case discussed with neurosurgery and okay to work with PT and OT.  OT and PT recommending rehab 2/20.  Patient  agreeable to go out to rehab. 2/21: Vitals with mildly elevated blood pressure at 152/57.  Medically stable.  TOC is looking for placement.  Assessment and Plan: * Subdural hematoma (HCC) Repeat CT scan shows a stable subdural hematoma.  Cleared by neurosurgery for disposition.  C3 cervical fracture (HCC) C3 stable fracture.  As per neurosurgery okay to wear collar for comfort.  Patient interested in trying a soft collar.  Compression fracture of T2 vertebra (HCC) Continue pain management.  Hypokalemia Replaced.  Chronic respiratory failure with hypoxia (HCC) Chronically on 2 L of oxygen  (HFpEF) heart failure with preserved ejection fraction [facility]) Patient is euvolemic at this time.  Last EF 60%. Continue torsemide 40 mg twice a day.  No beta-blocker with bradycardia.  Hold Zaroxolyn today.  Stage 3b chronic kidney disease ([place]) Today's creatinine 1.58 with a GFR of 31.  Holding Zaroxolyn today.  Sinus bradycardia Per chart review, patient's heart rate has been low at previous office visits, consistent with chronic sinus bradycardia.  No symptoms reported at this time.  Obesity (BMI 30-39.9) BMI 32.52 with current height and weight in computer.  Decubitus ulcer of coccyx, stage II ([place]) Present on admission, see full description below.        Subjective: Patient was sitting comfortably in chair without any neck collar when seen today.  Neck collar is causing some discomfort in the upper chest, patient already had some pain due to follow-up oxygen tank in  that area.  Pain otherwise seems controlled.  Physical Exam: Vitals:   01/06/23 2347 01/07/23 0738 01/07/23 1400 01/07/23 1459  BP: (!) 161/55 (!) 152/57  (!) 135/59  Pulse: (!) 52 (!) 54  (!) 59  Resp: 18 16  16  $ Temp: 98.2 F (36.8 C) 97.9 F (36.6 C)  97.9 F (36.6 C)  TempSrc:  Oral  Oral  SpO2: 95% 96%  96%  Weight:   72.3 kg   Height:       General.  Frail elderly lady, in no acute distress. Pulmonary.   Lungs clear bilaterally, normal respiratory effort. CV.  Regular rate and rhythm, no JVD, rub or murmur. Abdomen.  Soft, nontender, nondistended, BS positive. CNS.  Alert and oriented .  No focal neurologic deficit. Extremities.  No edema, no cyanosis, pulses intact and symmetrical. Psychiatry.  Judgment and insight appears normal.    Data Reviewed: Prior data reviewed.  Family Communication: Discussed with daughter-in-law on phone  Disposition: Status is: Inpatient Remains inpatient appropriate because: TOC looking into rehab options.  Medically stable once we do find an option.  Planned Discharge Destination: Rehab  Time spent: 38 minutes  This record has been created using Systems analyst. Errors have been sought and corrected,but may not always be located. Such creation errors do not reflect on the standard of care.   Author: Lorella Nimrod, MD 01/07/2023 3:33 PM  For on call review [email or web address].

## 2023-01-07 NOTE — NC FL2 (Signed)
[place] LEVEL OF CARE FORM     IDENTIFICATION  Patient Name: Helen Ramsey Birthdate: 05/17/1933 Sex: female Admission Date (Current Location): 01/04/2023  [facility] and [place] Number:  Engineering geologist and Address:  [facility], [address] [place], [place] [address]      Provider Number: [id number]  Attending Physician Name and Address:  Lorella Nimrod, MD  Relative Name and Phone Number:  Sadako Legault,   [phone]    Current Level of Care: Hospital Recommended Level of Care: [place] Prior Approval Number:    Date Approved/Denied:   PASRR Number: [id number]  Discharge Plan: SNF    Current Diagnoses: Patient Active Problem List   Diagnosis Date Noted   Decubitus ulcer of coccyx, stage II ([place]) 01/06/2023   Obesity (BMI 30-39.9) 01/06/2023   Subdural hematoma ([place]) 01/04/2023   C3 cervical fracture ([place]) 01/04/2023   Compression fracture of T2 vertebra ([place]) 01/04/2023   Hypokalemia 01/04/2023   Sinus bradycardia 01/04/2023   Drug-induced constipation 12/18/2022   CHF (congestive heart failure) ([place]) 12/10/2022   Chronic respiratory failure with hypoxia ([place]) 12/10/2022   Panniculitis 06/04/2022   MCI (mild cognitive impairment) 06/04/2022   ANA positive 04/28/2022   Morphea 04/28/2022   Stage 3b chronic kidney disease ([place]) 04/15/2022   Pressure injury of left buttock, stage 2 ([place]) 02/14/2022   Rash and nonspecific skin eruption 12/06/2021   Tremor 09/12/2021   Acquired hypothyroidism 05/17/2021   (HFpEF) heart failure with preserved ejection fraction ([place]) 05/16/2021   History of dyspnea 01/26/2021   Recurrent major depressive disorder, in partial remission ([place]) 02/27/2020   Bilateral myofascial pain 06/01/2019   Bilateral occipital neuralgia 06/01/2019   Age-related osteoporosis without current pathological fracture 08/16/2018   Gait instability 08/16/2018   Dorsalgia 03/31/2016    H/O total knee replacement 02/14/2016   Vitamin D deficiency 11/29/2015   Allergy to other foods 11/29/2015   Knee pain, bilateral 08/29/2015   Sleep apnea 05/30/2015   Mixed hyperlipidemia 05/09/2015   Restless legs syndrome 05/09/2015   Allergic rhinitis 03/28/2015   Anemia 03/28/2015   Chronic headache 03/28/2015   DDD (degenerative disc disease), lumbar 03/28/2015   Gastro-esophageal reflux disease without esophagitis 03/28/2015   Essential (primary) hypertension 03/28/2015   Adaptive colitis 03/28/2015   OP (osteoporosis) 03/28/2015   Nasal septal perforation 03/28/2015   Spinal stenosis 03/28/2015   Gastric ulcer without hemorrhage or perforation 03/28/2015   Cerebral artery occlusion with cerebral infarction ([place]) 03/28/2015   Connective tissue and disc stenosis of intervertebral foramina of abdomen and other regions 03/28/2015   Diverticulosis of large intestine without perforation or abscess without bleeding 03/28/2015   Osteoarthritis 03/28/2015   Other specified functional intestinal disorders 03/28/2015   Hardening of the aorta (main artery of the heart) ([place]) 07/13/2014   H/O adenomatous polyp of colon 06/27/2005    Orientation RESPIRATION BLADDER Height & Weight     Self, Time, Situation, Place  Normal External catheter Weight: 73 kg Height:  4' 11"$  (149.9 cm)  BEHAVIORAL SYMPTOMS/MOOD NEUROLOGICAL BOWEL NUTRITION STATUS  Other (Comment) (n/a)  (n/a) Continent Diet  AMBULATORY STATUS COMMUNICATION OF NEEDS Skin   Limited Assist Verbally Normal ([place])                       Personal Care Assistance Level of Assistance  Bathing, Dressing Bathing Assistance: Limited assistance   Dressing Assistance: Limited assistance     Functional Limitations Info  Sight Sight Info: Impaired        SPECIAL CARE FACTORS FREQUENCY  PT (By licensed PT), OT (By licensed OT)     PT Frequency: Min 2x weekly OT Frequency: Min 2x weekly            Contractures  Contractures Info: Not present    Additional Factors Info  Code Status, Allergies Code Status Info: DNR Allergies Info: Amoxicillin, Clindamycin/lincomycin, Doxycycline, Levaquin (Levofloxacin In D5w), Mirabegron, Nsaids, Oxycodone, Phenergan (Promethazine Hcl), Singulair (Montelukast), Vicodin (Hydrocodone-acetaminophen), Zocor (Simvastatin), Zoloft  (Sertraline), Ampicillin, Bactrim (Sulfamethoxazole-trimethoprim), Levofloxacin, Penicillins, Tolmetin           Current Medications (01/07/2023):  This is the current hospital active medication list Current Facility-Administered Medications  Medication Dose Route Frequency Provider Last Rate Last Admin   acetaminophen (TYLENOL) tablet 650 mg  650 mg Oral Q6H PRN Jose Persia, MD   650 mg at 01/05/23 [id number]   Or   acetaminophen (TYLENOL) suppository 650 mg  650 mg Rectal Q6H PRN Jose Persia, MD       albuterol (PROVENTIL) (2.5 MG/3ML) 0.083% nebulizer solution 2.5 mg  2.5 mg Nebulization Q6H PRN Jose Persia, MD       ALPRAZolam Duanne Moron) tablet 0.25 mg  0.25 mg Oral Daily PRN Jose Persia, MD   0.25 mg at 01/05/23 2136   atorvastatin (LIPITOR) tablet 20 mg  20 mg Oral QPM Jose Persia, MD   20 mg at 01/06/23 1825   citalopram (CELEXA) tablet 40 mg  40 mg Oral Daily Jose Persia, MD   40 mg at 01/07/23 0830   diphenhydrAMINE (BENADRYL) tablet 25 mg  25 mg Oral QHS PRN Jose Persia, MD       fluticasone furoate-vilanterol (BREO ELLIPTA) 100-25 MCG/ACT 1 puff  1 puff Inhalation Daily Jose Persia, MD   1 puff at [id number]   folic acid (FOLVITE) tablet 1 mg  1 mg Oral Daily Loletha [place], MD   1 mg at 01/07/23 0831   gabapentin (NEURONTIN) capsule 200 mg  200 mg Oral TID Jose Persia, MD   200 mg at 01/07/23 0830   hydroxychloroquine (PLAQUENIL) tablet 200 mg  200 mg Oral Daily Jose Persia, MD   200 mg at 01/07/23 0831   levothyroxine (SYNTHROID) tablet 50 mcg  50 mcg Oral QAC breakfast Jose Persia, MD   50  mcg at 01/07/23 [id number]   linaclotide (LINZESS) capsule 145 mcg  145 mcg Oral QAC breakfast Jose Persia, MD   145 mcg at 01/07/23 [id number]   methotrexate (RHEUMATREX) tablet 10 mg  10 mg Oral Weekly Loletha [place], MD   10 mg at 01/07/23 0828   ondansetron (ZOFRAN) tablet 4 mg  4 mg Oral Q6H PRN Jose Persia, MD       Or   ondansetron (ZOFRAN) injection 4 mg  4 mg Intravenous Q6H PRN Jose Persia, MD   4 mg at 01/05/23 2350   phenazopyridine (PYRIDIUM) tablet 100 mg  100 mg Oral BID PRN Jose Persia, MD       polyethylene glycol (MIRALAX / GLYCOLAX) packet 17 g  17 g Oral Daily PRN Jose Persia, MD       polyethylene glycol (MIRALAX / GLYCOLAX) packet 17 g  17 g Oral Daily Lorin Picket, RPH   17 g at 01/07/23 [id number]   potassium chloride SA (KLOR-CON M) CR tablet 40 mEq  40 mEq Oral BID Loletha [place], MD   40 mEq at 01/07/23 0830   senna (SENOKOT) tablet 8.6 mg  1 tablet Oral Daily Jose Persia, MD   8.6 mg at 01/07/23 0831   torsemide (DEMADEX) tablet 40 mg  40 mg Oral BID Jose Persia, MD   40 mg at 01/07/23 0830   traMADol (ULTRAM) tablet 50 mg  50 mg Oral TID Jose Persia, MD   50 mg at 01/07/23 0831     Discharge Medications: Please see discharge summary for a list of discharge medications.  Relevant Imaging Results:  Relevant Lab Results:   Additional Information SS# [id number]  Laurena Slimmer, RN

## 2023-01-08 LAB — CBC
HCT: 32 % — ABNORMAL LOW (ref 36.0–46.0)
Hemoglobin: 10.4 g/dL — ABNORMAL LOW (ref 12.0–15.0)
MCH: 32.7 pg (ref 26.0–34.0)
MCHC: 32.5 g/dL (ref 30.0–36.0)
MCV: 100.6 fL — ABNORMAL HIGH (ref 80.0–100.0)
Platelets: 218 10*3/uL (ref 150–400)
RBC: 3.18 MIL/uL — ABNORMAL LOW (ref 3.87–5.11)
RDW: 14.3 % (ref 11.5–15.5)
WBC: 5.6 10*3/uL (ref 4.0–10.5)
nRBC: 0 % (ref 0.0–0.2)

## 2023-01-08 LAB — BASIC METABOLIC PANEL
Anion gap: 9 (ref 5–15)
BUN: 62 mg/dL — ABNORMAL HIGH (ref 8–23)
CO2: 29 mmol/L (ref 22–32)
Calcium: 9.3 mg/dL (ref 8.9–10.3)
Chloride: 97 mmol/L — ABNORMAL LOW (ref 98–111)
Creatinine, Ser: 1.49 mg/dL — ABNORMAL HIGH (ref 0.44–1.00)
GFR, Estimated: 34 mL/min — ABNORMAL LOW (ref >60)
Glucose, Bld: 104 mg/dL — ABNORMAL HIGH (ref 70–99)
Potassium: 3.8 mmol/L (ref 3.5–5.1)
Sodium: 135 mmol/L (ref 135–145)

## 2023-01-08 NOTE — Progress Notes (Signed)
Mobility Specialist - Progress Note     01/08/23 0937  Mobility  Activity Ambulated with assistance to bathroom;Stood at bedside  Level of Assistance Contact guard assist, steadying assist  Assistive Device Front wheel walker  Distance Ambulated (ft) 5 ft  Activity Response Tolerated well  Mobility Referral Yes  $Mobility charge 1 Mobility   Pt resting in recliner on 2L upon entry. Pt STS and ambulates to bathroom CGA with AD. Pt had some trouble initially sitting on commode, BSC placed over commode to assist in sitting. Pt returned to recliner and left with needs in reach and chair alarm activated. Pt son present in room.   Loma Sender Mobility Specialist 01/08/23, 9:44 AM

## 2023-01-08 NOTE — Plan of Care (Signed)
  Problem: Education: Goal: Knowledge of General Education information will improve Description Including pain rating scale, medication(s)/side effects and non-pharmacologic comfort measures Outcome: Progressing   Problem: Activity: Goal: Risk for activity intolerance will decrease Outcome: Progressing   Problem: Pain Managment: Goal: General experience of comfort will improve Outcome: Progressing   Problem: Safety: Goal: Ability to remain free from injury will improve Outcome: Progressing   

## 2023-01-08 NOTE — Plan of Care (Signed)
  Problem: Clinical Measurements: Goal: Ability to maintain clinical measurements within normal limits will improve Outcome: Progressing   

## 2023-01-08 NOTE — Progress Notes (Signed)
Progress Note   Patient: Helen Ramsey [id number] DOB: [birth year] DOA: 01/04/2023     2 DOS: the patient was seen and examined on 01/08/2023   Brief hospital course: 89 y.o. female with medical history significant of chronic hypoxic respiratory failure on 2 L of supplemental oxygen, HFpEF, osteoporosis, CKD stage IIIb, morphea on methotrexate, who presents to the ED with complaints of a fall.   [name] states she was feeling well today and decided to go to church.  When she was getting out of her car using her walker and starting to walk up the ramp, her walker hit a bump and the oxygen concentrator began to fall.  She tried to catch it when she stood back up, she fell backwards and hit her head against the car.  Her oxygen concentrator hit her in the chest and she subsequently fell to the ground.  She denies any loss of consciousness.  After the fall, she was experiencing head pain, chest pain and back pain.  She denies any shortness of breath or palpitations.  She denies any recent illness, including fever, chills, nausea, vomiting, diarrhea, abdominal pain.   ED course: On arrival to the ED, patient was normotensive at 129/64 with heart rate of 65.  She was saturating at 98% on her home 2 L.  Initial workup remarkable for WBC of 8.6, hemoglobin of 10.3, sodium of 132, potassium of 2.8, glucose 139, BUN 39, creatinine of 1.56 and GFR of 32.  Chest x-ray was obtained that demonstrated mild vascular congestion.  Hip x-ray was obtained that did not show any acute findings.  CT of the head, C-spine, L-spine and T-spine were obtained.  CT head demonstrated 4 mm subdural hematoma.  CT of the C-spine demonstrated acute C3 fracture and T-spine demonstrated compression T2 fracture. Neurosurgery was consulted.  Patient placed in C-spine collar.  TRH consulted for admission.  2/19.  Case discussed with neurosurgery and okay to work with PT and OT.  OT and PT recommending rehab 2/20.  Patient  agreeable to go out to rehab. 2/21: Vitals with mildly elevated blood pressure at 152/57.  Medically stable.  TOC is looking for placement. 2/22: Patient remained stable.  Accepted at [facility].  Most likely can be discharged tomorrow.  Assessment and Plan: * Subdural hematoma (HCC) Repeat CT scan shows a stable subdural hematoma.  Cleared by neurosurgery for disposition.  C3 cervical fracture (HCC) C3 stable fracture.  As per neurosurgery okay to wear collar for comfort.  Patient interested in trying a soft collar.  Compression fracture of T2 vertebra (HCC) Continue pain management.  Hypokalemia Replaced.  Chronic respiratory failure with hypoxia (HCC) Chronically on 2 L of oxygen  (HFpEF) heart failure with preserved ejection fraction [facility]) Patient is euvolemic at this time.  Last EF 60%. Continue torsemide 40 mg twice a day.  No beta-blocker with bradycardia.  Hold Zaroxolyn today.  Stage 3b chronic kidney disease ([place]) Today's creatinine 1.58 with a GFR of 31.  Holding Zaroxolyn today.  Sinus bradycardia Per chart review, patient's heart rate has been low at previous office visits, consistent with chronic sinus bradycardia.  No symptoms reported at this time.  Obesity (BMI 30-39.9) BMI 32.52 with current height and weight in computer.  Decubitus ulcer of coccyx, stage II ([place]) Present on admission, see full description below.        Subjective: Patient was sitting in chair comfortably when seen today.  Having soft collar in place which is more comfortable  per patient.  No new concern.  Physical Exam: Vitals:   01/07/23 1400 01/07/23 1459 01/07/23 2310 01/08/23 0754  BP:  (!) 135/59 (!) 136/54 (!) 132/57  Pulse:  (!) 59 (!) 53 (!) 56  Resp:  16 18 18  $ Temp:  97.9 F (36.6 C) 98.3 F (36.8 C) 97.9 F (36.6 C)  TempSrc:  Oral    SpO2:  96% 94% 96%  Weight: 72.3 kg     Height:       General.  Frail elderly lady, in no acute distress.  Soft collar in  place. Pulmonary.  Lungs clear bilaterally, normal respiratory effort. CV.  Regular rate and rhythm, no JVD, rub or murmur. Abdomen.  Soft, nontender, nondistended, BS positive. CNS.  Alert and oriented .  No focal neurologic deficit. Extremities.  No edema, no cyanosis, pulses intact and symmetrical. Psychiatry.  Judgment and insight appears normal.     Data Reviewed: Prior data reviewed.  Family Communication: Discussed with son at bedside  Disposition: Status is: Inpatient Remains inpatient appropriate because: TOC looking into rehab options.  Medically stable once we do find an option.  Planned Discharge Destination: Rehab  Time spent: 37 minutes  This record has been created using Systems analyst. Errors have been sought and corrected,but may not always be located. Such creation errors do not reflect on the standard of care.   Author: Lorella Nimrod, MD 01/08/2023 2:46 PM  For on call review [email or web address].

## 2023-01-08 NOTE — Progress Notes (Signed)
Physical Therapy Treatment Patient Details Name: Helen Ramsey MRN: [id number] DOB: August 11, 1933 Today's Date: 01/08/2023   History of Present Illness Pt is an 89 y.o. female presenting to hospital 01/04/23 s/p mechanical fall.  Pt admitted with ground-level fall, 15m subdural hematoma, C3 cervical fx, T2 compression fx, and hypokalemia. PMH includes HFpEF, CVA, htn, DDD, chronic O2 use, R TKA, R RCR, CTR.    PT Comments    Patient received in recliner. She requests to go to the bathroom. Patent requires mod A to stand from low recliner and increased time. After ambulating into bathroom she is able to ambulate another 140 feet with rollator and min guard. She needs assistance with self care in bathroom. Patient requires increased time with mobility, especially transfers. Patient will continue to benefit from skilled PT to improve transfers and strength for safe return home.       Recommendations for follow up therapy are one component of a multi-disciplinary discharge planning process, led by the attending physician.  Recommendations may be updated based on patient status, additional functional criteria and insurance authorization.  Follow Up Recommendations  Skilled nursing-short term rehab (<3 hours/day) Can patient physically be transported by private vehicle: No   Assistance Recommended at Discharge Frequent or constant Supervision/Assistance  Patient can return home with the following A lot of help with walking and/or transfers;A little help with bathing/dressing/bathroom;Help with stairs or ramp for entrance;Assist for transportation;Assistance with cooking/housework   Equipment Recommendations  None recommended by PT    Recommendations for Other Services       Precautions / Restrictions Precautions Precautions: Cervical;Back Precaution Comments: precautions for comfort Required Braces or Orthoses: Cervical Brace Cervical Brace: Soft collar;For comfort Restrictions Weight  Bearing Restrictions: No Other Position/Activity Restrictions: Per neurosurgery note 2/19: "She is fine to begin mobilizing with PT and OT.  She does not require a brace for stabilization. The fracture at C3 is stable - she can wear a brace if she would like and it is helpful, but it is not necessary for stability."     Mobility  Bed Mobility Overal bed mobility: Needs Assistance Bed Mobility: Sit to Supine       Sit to supine: Min guard   General bed mobility comments: min guard assist to bring LEs back up onto bed.    Transfers Overall transfer level: Needs assistance Equipment used: Rolling walker (2 wheels) Transfers: Sit to/from Stand Sit to Stand: From elevated surface, Min assist                Ambulation/Gait Ambulation/Gait assistance: Min guard Gait Distance (Feet): 150 Feet Assistive device: Rollator (4 wheels) Gait Pattern/deviations: Step-through pattern, Decreased step length - right, Decreased step length - left, Decreased stride length, Trunk flexed Gait velocity: decreased     General Gait Details: patient steady with ambulation using rollator   Stairs             Wheelchair Mobility    Modified Rankin (Stroke Patients Only)       Balance Overall balance assessment: Needs assistance Sitting-balance support: Feet supported Sitting balance-Leahy Scale: Good Sitting balance - Comments: steady static sitting   Standing balance support: Reliant on assistive device for balance, During functional activity, Bilateral upper extremity supported Standing balance-Leahy Scale: Fair Standing balance comment: patient is steady with Rollator                            Cognition Arousal/Alertness: Awake/alert Behavior  During Therapy: WFL for tasks assessed/performed Overall Cognitive Status: Within Functional Limits for tasks assessed                                 General Comments: very pleasant and talkative         Exercises      General Comments        Pertinent Vitals/Pain Pain Assessment Pain Assessment: Faces Faces Pain Scale: Hurts a little bit Pain Location: Chest ( muscular) Pain Descriptors / Indicators: Discomfort, Sore Pain Intervention(s): Monitored during session    Home Living                          Prior Function            PT Goals (current goals can now be found in the care plan section) Acute Rehab PT Goals Patient Stated Goal: to improve pain and mobility PT Goal Formulation: With patient Time For Goal Achievement: 01/19/23 Potential to Achieve Goals: Good Progress towards PT goals: Progressing toward goals    Frequency    Min 2X/week      PT Plan Current plan remains appropriate    Co-evaluation              AM-PAC PT "6 Clicks" Mobility   Outcome Measure  Help needed turning from your back to your side while in a flat bed without using bedrails?: A Little Help needed moving from lying on your back to sitting on the side of a flat bed without using bedrails?: A Little Help needed moving to and from a bed to a chair (including a wheelchair)?: A Little Help needed standing up from a chair using your arms (e.g., wheelchair or bedside chair)?: A Lot Help needed to walk in hospital room?: A Little Help needed climbing 3-5 steps with a railing? : A Lot 6 Click Score: 16    End of Session Equipment Utilized During Treatment: Gait belt;Oxygen Activity Tolerance: Patient tolerated treatment well Patient left: in bed;with call bell/phone within reach;with bed alarm set Nurse Communication: Mobility status PT Visit Diagnosis: Muscle weakness (generalized) (M62.81);History of falling (Z91.81);Pain;Difficulty in walking, not elsewhere classified (R26.2) Pain - part of body:  (chest)     Time: [id number] PT Time Calculation (min) (ACUTE ONLY): 25 min  Charges:  $Gait Training: 8-22 mins $Therapeutic Activity: 8-22 mins                      Alexsander Cavins, PT, GCS 01/08/23,1:48 PM

## 2023-01-08 NOTE — Progress Notes (Addendum)
Mobility Specialist - Progress Note  Post-mobility:SPO2(93)     01/08/23 0900  Mobility  Activity Ambulated with assistance in hallway;Stood at bedside;Dangled on edge of bed  Level of Assistance Contact guard assist, steadying assist  Assistive Device Front wheel walker  Distance Ambulated (ft) 145 ft  Activity Response Tolerated well  Mobility Referral Yes  $Mobility charge 1 Mobility   Pt resting in recliner on 2L upon entry. Pt STS and ambulates to hallway around NS CGA with AD. Pt returned to recliner and left with needs in reach and chair alarm activated. Family member present in room.   Loma Sender Mobility Specialist 01/08/23, 9:40 AM

## 2023-01-08 NOTE — Progress Notes (Signed)
PT Cancellation Note  Patient Details Name: NAILANI BURZINSKI MRN: [id number] DOB: Oct 13, 1933   Cancelled Treatment:    Reason Eval/Treat Not Completed: Patient at procedure or test/unavailable. Patient working with mobility specialist at this time. Will return later as time allows.    Joda Braatz 01/08/2023, 9:12 AM

## 2023-01-08 NOTE — Progress Notes (Signed)
Occupational Therapy Treatment Patient Details Name: Helen Ramsey MRN: [id number] DOB: 11-Sep-1933 Today's Date: 01/08/2023   History of present illness Pt is an 89 y.o. female presenting to hospital 01/04/23 s/p mechanical fall.  Pt admitted with ground-level fall, 16m subdural hematoma, C3 cervical fx, T2 compression fx, and hypokalemia. PMH includes HFpEF, CVA, htn, DDD, chronic O2 use, R TKA, R RCR, CTR.   OT comments  Chart reviewed, pt greeted in bed agreeable to OT tx session. Tx session targeted progressing functional activity tolerance in the setting of ADL tasks. Progress noted in standing ADL tasks with pt completing grooming, oral care in standing with supervision with rollator. Pt amb in room approx 20' with rollator with CGA. Multi modal cueing and MOD A required for optimal body mechanics for bed mobility. Pt is left as received, all needs met. Progress is being made towards goals, discharge recommendation remains appropriate.    Recommendations for follow up therapy are one component of a multi-disciplinary discharge planning process, led by the attending physician.  Recommendations may be updated based on patient status, additional functional criteria and insurance authorization.    Follow Up Recommendations  Skilled nursing-short term rehab (<3 hours/day)     Assistance Recommended at Discharge Frequent or constant Supervision/Assistance  Patient can return home with the following  A lot of help with walking and/or transfers;A lot of help with bathing/dressing/bathroom;Help with stairs or ramp for entrance;Assist for transportation;Assistance with cooking/housework   Equipment Recommendations  Other (comment) (per next venue of care)    Recommendations for Other Services      Precautions / Restrictions Precautions Precautions: Cervical;Back Precaution Comments: precautions for comfort Required Braces or Orthoses: Cervical Brace Cervical Brace: Soft collar;For  comfort Restrictions Weight Bearing Restrictions: No Other Position/Activity Restrictions: Per neurosurgery note 2/19: "She is fine to begin mobilizing with PT and OT.  She does not require a brace for stabilization. The fracture at C3 is stable - she can wear a brace if she would like and it is helpful, but it is not necessary for stability."       Mobility Bed Mobility Overal bed mobility: Needs Assistance Bed Mobility: Supine to Sit, Sit to Supine Rolling: Min assist Sidelying to sit: Mod assist, HOB elevated     Sit to sidelying: Mod assist, HOB elevated General bed mobility comments: multi modal cueing for body mechanics    Transfers Overall transfer level: Needs assistance Equipment used: Rolling walker (2 wheels) Transfers: Sit to/from Stand Sit to Stand: From elevated surface, Min assist                 Balance Overall balance assessment: Needs assistance Sitting-balance support: Feet supported Sitting balance-Leahy Scale: Good     Standing balance support: Reliant on assistive device for balance, During functional activity, Bilateral upper extremity supported Standing balance-Leahy Scale: Fair                             ADL either performed or assessed with clinical judgement   ADL Overall ADL's : Needs assistance/impaired Eating/Feeding: Set up;Sitting   Grooming: Wash/dry hands;Wash/dry face;Oral care;Standing;Min guard Grooming Details (indicate cue type and reason): sink level with rollator             Lower Body Dressing: Minimal assistance   Toilet Transfer: Min guard;Minimal assistance;Rollator (4 wheels)           Functional mobility during ADLs: Min guard;Rollator (4 wheels) (approx 20'  in room with rollator)      Extremity/Trunk Assessment              Vision       Perception     Praxis      Cognition Arousal/Alertness: Awake/alert Behavior During Therapy: WFL for tasks assessed/performed Overall  Cognitive Status: Within Functional Limits for tasks assessed                                          Exercises      Shoulder Instructions       General Comments spo2 >90% throughout on 2 L via [place]    Pertinent Vitals/ Pain       Pain Assessment Pain Assessment: Faces Faces Pain Scale: Hurts little more Pain Location: chest- muscular Pain Descriptors / Indicators: Discomfort, Sore Pain Intervention(s): Monitored during session  Home Living                                          Prior Functioning/Environment              Frequency  Min 2X/week        Progress Toward Goals  OT Goals(current goals can now be found in the care plan section)  Progress towards OT goals: Progressing toward goals     Plan Discharge plan remains appropriate;Frequency remains appropriate    Co-evaluation                 AM-PAC OT "6 Clicks" Daily Activity     Outcome Measure   Help from another person eating meals?: None Help from another person taking care of personal grooming?: A Little Help from another person toileting, which includes using toliet, bedpan, or urinal?: A Lot Help from another person bathing (including washing, rinsing, drying)?: A Lot Help from another person to put on and taking off regular upper body clothing?: A Little Help from another person to put on and taking off regular lower body clothing?: A Little 6 Click Score: 17    End of Session Equipment Utilized During Treatment: Rollator (4 wheels)  OT Visit Diagnosis: Unsteadiness on feet (R26.81);Repeated falls (R29.6);Muscle weakness (generalized) (M62.81);History of falling (Z91.81)   Activity Tolerance Patient tolerated treatment well   Patient Left in chair;with call bell/phone within reach;with chair alarm set   Nurse Communication Mobility status        Time: 1415-1435 OT Time Calculation (min): 20 min  Charges: OT General Charges $OT Visit: 1  Visit OT Treatments $Self Care/Home Management : 8-22 mins Shanon Payor, OTD OTR/L  01/08/23, 3:00 PM

## 2023-01-08 NOTE — TOC Progression Note (Signed)
Transition of Care [facility]) - Progression Note    Patient Details  Name: Helen Ramsey MRN: [id number] Date of Birth: November 02, 1933  [facility]) CM/SW Contact  Laurena Slimmer, RN Phone Number: 01/08/2023, 2:01 PM  Clinical Narrative:    Spoke with patient's son. [name]. Gave bed offers for [facility], [facility], [facility], and [facility]. Family has chosen [facility].  [name] stated he would be out of town but his sister [name] would be available @336$ -Benoit in admissions at [facility].  Patient can be accepted tomorrow.  MD notified.         Expected Discharge Plan and Services                                               Social Determinants of Health (SDOH) Interventions SDOH Screenings   Food Insecurity: No Food Insecurity (01/05/2023)  Housing: Low Risk  (01/05/2023)  Transportation Needs: No Transportation Needs (01/05/2023)  Utilities: Not At Risk (01/05/2023)  Alcohol Screen: Low Risk  (12/18/2022)  Depression (PHQ2-9): Medium Risk (12/18/2022)  Financial Resource Strain: Low Risk  (05/06/2022)  Physical Activity: Insufficiently Active (05/06/2022)  Social Connections: Moderately Isolated (05/06/2022)  Stress: No Stress Concern Present (05/06/2022)  Tobacco Use: Low Risk  (01/04/2023)    Readmission Risk Interventions     No data to display

## 2023-01-09 DIAGNOSIS — J309 Allergic rhinitis, unspecified: Secondary | ICD-10-CM | POA: Diagnosis not present

## 2023-01-09 DIAGNOSIS — S22030A Wedge compression fracture of third thoracic vertebra, initial encounter for closed fracture: Secondary | ICD-10-CM

## 2023-01-09 DIAGNOSIS — M1990 Unspecified osteoarthritis, unspecified site: Secondary | ICD-10-CM

## 2023-01-09 DIAGNOSIS — I6200 Nontraumatic subdural hemorrhage, unspecified: Secondary | ICD-10-CM | POA: Diagnosis not present

## 2023-01-09 DIAGNOSIS — Z6832 Body mass index (BMI) 32.0-32.9, adult: Secondary | ICD-10-CM | POA: Diagnosis not present

## 2023-01-09 DIAGNOSIS — I5032 Chronic diastolic (congestive) heart failure: Secondary | ICD-10-CM | POA: Diagnosis not present

## 2023-01-09 DIAGNOSIS — E669 Obesity, unspecified: Secondary | ICD-10-CM | POA: Diagnosis not present

## 2023-01-09 DIAGNOSIS — E876 Hypokalemia: Secondary | ICD-10-CM | POA: Diagnosis not present

## 2023-01-09 DIAGNOSIS — E559 Vitamin D deficiency, unspecified: Secondary | ICD-10-CM | POA: Diagnosis not present

## 2023-01-09 DIAGNOSIS — W19XXXA Unspecified fall, initial encounter: Secondary | ICD-10-CM | POA: Diagnosis not present

## 2023-01-09 DIAGNOSIS — Z7982 Long term (current) use of aspirin: Secondary | ICD-10-CM | POA: Diagnosis not present

## 2023-01-09 DIAGNOSIS — I5030 Unspecified diastolic (congestive) heart failure: Secondary | ICD-10-CM | POA: Diagnosis not present

## 2023-01-09 DIAGNOSIS — R001 Bradycardia, unspecified: Secondary | ICD-10-CM | POA: Diagnosis not present

## 2023-01-09 DIAGNOSIS — W19XXXD Unspecified fall, subsequent encounter: Secondary | ICD-10-CM | POA: Diagnosis not present

## 2023-01-09 DIAGNOSIS — I498 Other specified cardiac arrhythmias: Secondary | ICD-10-CM | POA: Diagnosis not present

## 2023-01-09 DIAGNOSIS — R079 Chest pain, unspecified: Secondary | ICD-10-CM | POA: Diagnosis not present

## 2023-01-09 DIAGNOSIS — Z7401 Bed confinement status: Secondary | ICD-10-CM | POA: Diagnosis not present

## 2023-01-09 DIAGNOSIS — Z9981 Dependence on supplemental oxygen: Secondary | ICD-10-CM | POA: Diagnosis not present

## 2023-01-09 DIAGNOSIS — S065XAA Traumatic subdural hemorrhage with loss of consciousness status unknown, initial encounter: Secondary | ICD-10-CM | POA: Diagnosis not present

## 2023-01-09 DIAGNOSIS — R296 Repeated falls: Secondary | ICD-10-CM | POA: Diagnosis not present

## 2023-01-09 DIAGNOSIS — F418 Other specified anxiety disorders: Secondary | ICD-10-CM | POA: Diagnosis not present

## 2023-01-09 DIAGNOSIS — L89152 Pressure ulcer of sacral region, stage 2: Secondary | ICD-10-CM | POA: Diagnosis not present

## 2023-01-09 DIAGNOSIS — Z96651 Presence of right artificial knee joint: Secondary | ICD-10-CM | POA: Diagnosis not present

## 2023-01-09 DIAGNOSIS — S22020D Wedge compression fracture of second thoracic vertebra, subsequent encounter for fracture with routine healing: Secondary | ICD-10-CM | POA: Diagnosis not present

## 2023-01-09 DIAGNOSIS — S22030D Wedge compression fracture of third thoracic vertebra, subsequent encounter for fracture with routine healing: Secondary | ICD-10-CM | POA: Diagnosis not present

## 2023-01-09 DIAGNOSIS — M5136 Other intervertebral disc degeneration, lumbar region: Secondary | ICD-10-CM | POA: Diagnosis not present

## 2023-01-09 DIAGNOSIS — I130 Hypertensive heart and chronic kidney disease with heart failure and stage 1 through stage 4 chronic kidney disease, or unspecified chronic kidney disease: Secondary | ICD-10-CM | POA: Diagnosis not present

## 2023-01-09 DIAGNOSIS — G4700 Insomnia, unspecified: Secondary | ICD-10-CM | POA: Diagnosis not present

## 2023-01-09 DIAGNOSIS — M5481 Occipital neuralgia: Secondary | ICD-10-CM | POA: Diagnosis not present

## 2023-01-09 DIAGNOSIS — M6281 Muscle weakness (generalized): Secondary | ICD-10-CM | POA: Diagnosis not present

## 2023-01-09 DIAGNOSIS — S065X0D Traumatic subdural hemorrhage without loss of consciousness, subsequent encounter: Secondary | ICD-10-CM | POA: Diagnosis not present

## 2023-01-09 DIAGNOSIS — G2581 Restless legs syndrome: Secondary | ICD-10-CM | POA: Diagnosis not present

## 2023-01-09 DIAGNOSIS — S12201A Unspecified nondisplaced fracture of third cervical vertebra, initial encounter for closed fracture: Secondary | ICD-10-CM | POA: Diagnosis not present

## 2023-01-09 DIAGNOSIS — K5190 Ulcerative colitis, unspecified, without complications: Secondary | ICD-10-CM | POA: Diagnosis not present

## 2023-01-09 DIAGNOSIS — N1832 Chronic kidney disease, stage 3b: Secondary | ICD-10-CM | POA: Diagnosis not present

## 2023-01-09 DIAGNOSIS — S12201D Unspecified nondisplaced fracture of third cervical vertebra, subsequent encounter for fracture with routine healing: Secondary | ICD-10-CM | POA: Diagnosis not present

## 2023-01-09 DIAGNOSIS — J9611 Chronic respiratory failure with hypoxia: Secondary | ICD-10-CM | POA: Diagnosis not present

## 2023-01-09 DIAGNOSIS — S12200D Unspecified displaced fracture of third cervical vertebra, subsequent encounter for fracture with routine healing: Secondary | ICD-10-CM | POA: Diagnosis not present

## 2023-01-09 DIAGNOSIS — L89150 Pressure ulcer of sacral region, unstageable: Secondary | ICD-10-CM | POA: Diagnosis not present

## 2023-01-09 DIAGNOSIS — K5900 Constipation, unspecified: Secondary | ICD-10-CM | POA: Diagnosis not present

## 2023-01-09 DIAGNOSIS — K219 Gastro-esophageal reflux disease without esophagitis: Secondary | ICD-10-CM | POA: Diagnosis not present

## 2023-01-09 DIAGNOSIS — I959 Hypotension, unspecified: Secondary | ICD-10-CM | POA: Diagnosis not present

## 2023-01-09 MED ORDER — TRAMADOL HCL 50 MG PO TABS: 50.0000 mg | ORAL_TABLET | Freq: Three times a day (TID) | ORAL | 0 refills | Status: DC

## 2023-01-09 NOTE — Progress Notes (Signed)
Mobility Specialist - Progress Note    01/09/23 0900  Mobility  Activity Ambulated with assistance to bathroom  Level of Assistance Contact guard assist, steadying assist  Assistive Device Front wheel walker  Distance Ambulated (ft) 10 ft  Activity Response Tolerated well  Mobility Referral Yes  $Mobility charge 1 Mobility    [employer] Mobility Specialist 01/09/23, 9:29 AM

## 2023-01-09 NOTE — Plan of Care (Signed)
Patient discharged per MD orders at this time.All dc instructions, education and medications reviewed with the patient.Pt expressed understanding and will comply with dc instructions.f/u appointments was also communicated to the patient.no verbal c/o or any ssx of distress.Pt was discharged to the [facility] facility for PT/OT services per order.Report was called to staff nurse Amy before transport.Pt was transported by 2 [facility] personnel on a stretcher.

## 2023-01-09 NOTE — Discharge Summary (Signed)
Physician Discharge Summary   Patient: Helen Ramsey MRN: [id number] DOB: 02/28/1933  Admit date:     01/04/2023  Discharge date: 01/09/23  Discharge Physician: Lorella Nimrod   PCP: Virginia Crews, MD   Recommendations at discharge:  Please obtain CBC and BMP in 1 week Patient need to wear neck collar while awake and moving around. Patient is on multiple laxatives, please be vigilant with the use to prevent diarrhea. Follow-up with neurosurgery in 1 to 2 weeks Follow-up with primary care provider  Discharge Diagnoses: Principal Problem:   Subdural hematoma (HCC) Active Problems:   C3 cervical fracture (HCC)   Compression fracture of T2 vertebra (HCC)   Hypokalemia   Chronic respiratory failure with hypoxia (HCC)   (HFpEF) heart failure with preserved ejection fraction (HCC)   Stage 3b chronic kidney disease (HCC)   Sinus bradycardia   Arthritis   Decubitus ulcer of coccyx, stage II (HCC)   Obesity (BMI 30-39.9)   Closed wedge compression fracture of third thoracic vertebra [facility])   Hospital Course: 89 y.o. female with medical history significant of chronic hypoxic respiratory failure on 2 L of supplemental oxygen, HFpEF, osteoporosis, CKD stage IIIb, morphea on methotrexate, who presents to the ED with complaints of a fall.   Helen Ramsey states she was feeling well today and decided to go to church.  When she was getting out of her car using her walker and starting to walk up the ramp, her walker hit a bump and the oxygen concentrator began to fall.  She tried to catch it when she stood back up, she fell backwards and hit her head against the car.  Her oxygen concentrator hit her in the chest and she subsequently fell to the ground.  She denies any loss of consciousness.  After the fall, she was experiencing head pain, chest pain and back pain.  She denies any shortness of breath or palpitations.  She denies any recent illness, including fever, chills, nausea, vomiting,  diarrhea, abdominal pain.   ED course: On arrival to the ED, patient was normotensive at 129/64 with heart rate of 65.  She was saturating at 98% on her home 2 L.  Initial workup remarkable for WBC of 8.6, hemoglobin of 10.3, sodium of 132, potassium of 2.8, glucose 139, BUN 39, creatinine of 1.56 and GFR of 32.  Chest x-ray was obtained that demonstrated mild vascular congestion.  Hip x-ray was obtained that did not show any acute findings.  CT of the head, C-spine, L-spine and T-spine were obtained.  CT head demonstrated 4 mm subdural hematoma.  CT of the C-spine demonstrated acute C3 fracture and T-spine demonstrated compression T2 fracture. Neurosurgery was consulted.  Patient placed in C-spine collar.  TRH consulted for admission.  2/19.  Case discussed with neurosurgery and okay to work with PT and OT.  OT and PT recommending rehab 2/20.  Patient agreeable to go out to rehab. 2/21: Vitals with mildly elevated blood pressure at 152/57.  Medically stable.  TOC is looking for placement. 2/22: Patient remained stable.  Accepted at [facility].  Most likely can be discharged tomorrow. 2/23: Hemodynamically stable.  Patient will restart her home aspirin from tomorrow on discharge. She was on multiple laxatives few of them but discontinued.  Metolazone was also discontinued.  She is being discharged to skilled care nursing facility for rehab. She will continue the rest of her home medications and need to have a close follow-up with her providers for further recommendations.  Assessment and Plan: * Subdural hematoma (HCC) Repeat CT scan shows a stable subdural hematoma.  Cleared by neurosurgery for disposition.  C3 cervical fracture (HCC) C3 stable fracture.  As per neurosurgery okay to wear collar for comfort.  Patient interested in trying a soft collar.  Compression fracture of T2 vertebra (HCC) Continue pain management.  Hypokalemia Replaced.  Chronic respiratory failure with hypoxia  (HCC) Chronically on 2 L of oxygen  (HFpEF) heart failure with preserved ejection fraction [facility]) Patient is euvolemic at this time.  Last EF 60%. Continue torsemide 40 mg twice a day.  No beta-blocker with bradycardia.  Hold Zaroxolyn today.  Stage 3b chronic kidney disease ([place]) Today's creatinine 1.58 with a GFR of 31.  Holding Zaroxolyn today.  Sinus bradycardia Per chart review, patient's heart rate has been low at previous office visits, consistent with chronic sinus bradycardia.  No symptoms reported at this time.  Obesity (BMI 30-39.9) BMI 32.52 with current height and weight in computer.  Decubitus ulcer of coccyx, stage II ([place]) Present on admission, see full description below.        Pain control - [employer] Controlled Substance Reporting System database was reviewed. and patient was instructed, not to drive, operate heavy machinery, perform activities at heights, swimming or participation in water activities or provide baby-sitting services while on Pain, Sleep and Anxiety Medications; until their outpatient Physician has advised to do so again. Also recommended to not to take more than prescribed Pain, Sleep and Anxiety Medications.  Consultants: Neurosurgery Procedures performed: None Disposition: Skilled nursing facility Diet recommendation:  Discharge Diet Orders (From admission, onward)     Start     Ordered   01/09/23 0000  Diet - low sodium heart healthy        01/09/23 1008           Cardiac diet DISCHARGE MEDICATION: Allergies as of 01/09/2023       Reactions   Amoxicillin    Clindamycin/lincomycin    Doxycycline    Levaquin [levofloxacin In D5w]    Mirabegron Nausea And Vomiting   Nsaids Other (See Comments)   GI upset   Oxycodone Nausea Only   Other reaction(s): Hallucination   Phenergan [promethazine Hcl]    Singulair [montelukast]    Vicodin [hydrocodone-acetaminophen]    Zocor [simvastatin]    Zoloft  [sertraline] Other (See  Comments)   Ampicillin Rash   Bactrim [sulfamethoxazole-trimethoprim] Rash   Levofloxacin Other (See Comments)   Weakness   Penicillins Rash   Ampicillin, Clindamycin, Doxycycline Levaquin-severe headache, muscle and joint aches, weak   Tolmetin    Other reaction(s): Other (See Comments) GI upset        Medication List     STOP taking these medications    cetirizine 10 MG tablet Commonly known as: ZYRTEC   EMGALITY [place]   metolazone 5 MG tablet Commonly known as: ZAROXOLYN       TAKE these medications    acetaminophen 500 MG tablet Commonly known as: TYLENOL Take 1,000 mg by mouth in the morning and at bedtime.   albuterol 108 (90 Base) MCG/ACT inhaler Commonly known as: VENTOLIN HFA Inhale 2 puffs into the lungs every 6 (six) hours as needed for wheezing or shortness of breath.   ALPRAZolam 0.25 MG tablet Commonly known as: XANAX TAKE 1 TABLET BY MOUTH TWICE DAILY AS NEEDED FOR ANXIETY   aspirin EC 81 MG tablet Take 81 mg by mouth daily.   atorvastatin 20 MG tablet Commonly known  as: LIPITOR Take 1 tablet (20 mg total) by mouth daily.   citalopram 40 MG tablet Commonly known as: CELEXA Take 1 tablet (40 mg total) by mouth daily.   cyanocobalamin 1000 MCG tablet Take 1,000 mcg by mouth daily.   diphenhydrAMINE 25 MG tablet Commonly known as: BENADRYL Take 1 tablet (25 mg total) by mouth at bedtime as needed for sleep.   famotidine 20 MG tablet Commonly known as: PEPCID Take 1 tablet (20 mg total) by mouth daily.   ferrous sulfate 325 (65 FE) MG tablet Take 325 mg by mouth daily with supper.   Fish Oil 1200 MG Caps Take 1,200 mg by mouth daily.   FLUORIDE TOOTHPASTE DT Place onto teeth daily.   fluticasone furoate-vilanterol 100-25 MCG/ACT Aepb Commonly known as: Breo Ellipta Inhale 1 puff into the lungs daily.   folic acid 1 MG tablet Commonly known as: FOLVITE Take 1 mg by mouth daily.   gabapentin 100 MG capsule Commonly known as:  NEURONTIN Take 2 capsules (200 mg total) by mouth 2 (two) times daily AND 3 capsules (300 mg total) at bedtime.   glycerin adult 2 g suppository Place 1 suppository rectally as needed for constipation.   hydroxychloroquine 200 MG tablet Commonly known as: PLAQUENIL Take 200 mg by mouth daily.   levothyroxine 50 MCG tablet Commonly known as: SYNTHROID TAKE 1 TABLET(50 MCG) BY MOUTH DAILY BEFORE BREAKFAST What changed:  how much to take how to take this when to take this   linaclotide 145 MCG Caps capsule Commonly known as: Linzess Take 1 capsule (145 mcg total) by mouth daily before breakfast.   MAGNESIUM GLYCINATE PO Take 400 mg by mouth daily.   magnesium hydroxide 400 MG/5ML suspension Commonly known as: MILK OF MAGNESIA Take 5 mLs by mouth daily as needed for mild constipation.   melatonin 5 MG Tabs Take 5 mg by mouth at bedtime as needed.   methotrexate 2.5 MG tablet Commonly known as: RHEUMATREX Take 10 mg by mouth once a week.   MIRALAX PO Take 17 g by mouth daily.   nystatin powder Commonly known as: MYCOSTATIN/NYSTOP Apply 1 Application topically 3 (three) times daily.   omeprazole 20 MG capsule Commonly known as: PRILOSEC TAKE 1 CAPSULE(20 MG) BY MOUTH DAILY   phenazopyridine 95 MG tablet Commonly known as: AZO-TABS Take 1 tablet (95 mg total) by mouth as needed. What changed: when to take this   potassium gluconate 595 (99 K) MG Tabs tablet Take 1 tablet (595 mg total) by mouth 2 (two) times daily.   senna 8.6 MG tablet Commonly known as: SENOKOT Take 1 tablet (8.6 mg total) by mouth daily.   sodium chloride 0.65 % nasal spray Commonly known as: OCEAN Place 1 spray into the nose as needed.   Torsemide 40 MG Tabs Take 40 mg by mouth 2 (two) times daily.   traMADol 50 MG tablet Commonly known as: ULTRAM Take 1 tablet (50 mg total) by mouth 3 (three) times daily.   triamcinolone cream 0.1 % Commonly known as: KENALOG 2 weeks on and off 2  weeks   VITAMIN D3 PO Take 1,000 Units by mouth.               Discharge Care Instructions  (From admission, onward)           Start     Ordered   01/09/23 0000  Discharge wound care:       Comments: Change dressing as needed   01/09/23  [place]     Bacigalupo, [name], MD. Schedule an appointment as soon as possible for a visit in 1 week(s).   Specialty: Family Medicine Contact information: [address] [place] [address] [phone]         Meade Maw, MD. Schedule an appointment as soon as possible for a visit in 1 week(s).   Specialty: Neurosurgery Contact information: [address] [place] [address] [phone]                Discharge Exam: Danley Danker Weights   01/04/23 1125 01/07/23 1400  Weight: 73 kg 72.3 kg   General.     In no acute distress.  Soft collar in place Pulmonary.  Lungs clear bilaterally, normal respiratory effort. CV.  Regular rate and rhythm, no JVD, rub or murmur. Abdomen.  Soft, nontender, nondistended, BS positive. CNS.  Alert and oriented .  No focal neurologic deficit. Extremities.  No edema, no cyanosis, pulses intact and symmetrical. Psychiatry.  Judgment and insight appears normal.   Condition at discharge: stable  The results of significant diagnostics from this hospitalization (including imaging, microbiology, ancillary and laboratory) are listed below for reference.   Imaging Studies: CT HEAD WO CONTRAST (5MM)  Result Date: 01/04/2023 CLINICAL DATA:  Follow-up subdural hematoma. EXAM: CT HEAD WITHOUT CONTRAST TECHNIQUE: Contiguous axial images were obtained from the base of the skull through the vertex without intravenous contrast. RADIATION DOSE REDUCTION: This exam was performed according to the departmental dose-optimization program which includes automated exposure control, adjustment of the mA and/or kV according to patient size  and/or use of iterative reconstruction technique. COMPARISON:  01/04/2023 FINDINGS: Brain: Redemonstrated subdural hematoma along the left aspect of the falx and along the tentorium, which measures up to 3 mm, unchanged when remeasured similarly. No significant mass effect or midline shift. No evidence of acute infarction, parenchymal hemorrhage, mass, or hydrocephalus. Periventricular white matter changes, likely the sequela of chronic small vessel ischemic disease. Vascular: No hyperdense vessel. Skull: Negative for fracture or focal lesion. Sinuses/Orbits: No acute finding. Status post bilateral lens replacements. Other: The mastoid air cells are well aerated. IMPRESSION: Unchanged 3 mm subdural hematoma along the left aspect of the falx and tentorium. No significant mass effect or midline shift. Electronically Signed   By: Merilyn Baba M.D.   On: 01/04/2023 20:51   CT Cervical Spine Wo Contrast  Result Date: 01/04/2023 CLINICAL DATA:  [place]. EXAM: CT CERVICAL SPINE WITHOUT CONTRAST TECHNIQUE: Multidetector CT imaging of the cervical spine was performed without intravenous contrast. Multiplanar CT image reconstructions were also generated. RADIATION DOSE REDUCTION: This exam was performed according to the departmental dose-optimization program which includes automated exposure control, adjustment of the mA and/or kV according to patient size and/or use of iterative reconstruction technique. COMPARISON:  12/10/2022 FINDINGS: Alignment: Stable alignment of the cervical vertebral bodies. Degenerative anterior subluxations at C4 and C5 are unchanged. Skull base and vertebrae: Advanced degenerative changes at C1-2 with pannus formation but no significant mass effect on the upper cervical cord. There is an acute fracture involving the right lamina and spinous process of C3. The pedicles are intact. No facet fractures. The facets are normally aligned. There is also a compression fracture T2. Soft tissues and spinal  canal: No prevertebral fluid or swelling. No visible canal hematoma. Disc levels:  Stable calcified disc protrusions at C4-5 and C5-6. Upper chest: The lung apices are  grossly clear. Other: No neck mass or adenopathy or hematoma. Bilateral carotid artery calcifications. IMPRESSION: 1. Acute fracture involving the right lamina and spinous process of C3. 2. Compression fracture of T2. 3. Stable degenerative anterior subluxations at C4 and C5. 4. Advanced degenerative changes at C1-2 with pannus formation but no significant mass effect on the upper cervical cord. 5. Stable calcified disc protrusions at C4-5 and C5-6. Electronically Signed   By: Marijo Sanes M.D.   On: 01/04/2023 14:33   CT Thoracic Spine Wo Contrast  Result Date: 01/04/2023 CLINICAL DATA:  [address] in parking lot. EXAM: CT THORACIC AND LUMBAR SPINE WITHOUT CONTRAST TECHNIQUE: Multidetector CT imaging of the thoracic and lumbar spine was performed without contrast. Multiplanar CT image reconstructions were also generated. RADIATION DOSE REDUCTION: This exam was performed according to the departmental dose-optimization program which includes automated exposure control, adjustment of the mA and/or kV according to patient size and/or use of iterative reconstruction technique. COMPARISON:  Chest CT 12/10/2022 FINDINGS: CT THORACIC SPINE FINDINGS Alignment: Stable thoracic scoliosis and exaggerated thoracic kyphosis. Normal alignment in the sagittal plane. Vertebrae: There is a T2 compression fracture which was not present on the prior chest CT. The L1 compression fracture is chronic. No other thoracic compression fractures are identified. There is significant osteoporosis and changes of DISH. Paraspinal and other soft tissues: No significant paraspinal, posterior mediastinal or posterior long abnormality. A large calcified splenic cyst is noted. Advanced aortic calcifications but no aneurysm. Disc levels: The thoracic spinal canal is generous. No  significant canal stenosis. No retropulsion at T2. CT LUMBAR SPINE FINDINGS Segmentation: Transitional lumbar anatomy noted with partial lumbarization of S1. Alignment: Degenerative anterolisthesis of L5. Vertebrae: Remote L1 compression fracture with mild retropulsion of the posterosuperior aspect of vertebral body. Advanced osteoporosis but no other fractures are identified. Advanced multilevel facet disease but no definite pars defects or fractures. Paraspinal and other soft tissues: No significant paraspinal or retroperitoneal findings. Advanced aortic calcifications but no aneurysm. Disc levels: T12-L1: Retropulsion of L1 with flattening of the ventral thecal sac but no significant canal stenosis. L1-2: No significant findings. L2-3: No significant findings. L3-4: Bulging annulus and advanced facet disease with ligamentum flavum thickening contributing to mild spinal and bilateral lateral recess stenosis. There is also mild right foraminal stenosis. L4-5: Diffuse bulging uncovered disc, advanced facet disease and ligamentum flavum thickening contributing to moderate spinal and bilateral lateral recess stenosis. No significant foraminal stenosis. L5-S1: Bulging uncovered disc and severe facet disease with moderate spinal and bilateral lateral recess stenosis and mild bilateral foraminal stenosis. Small disc space at S1-2 but no significant findings. IMPRESSION: 1. New/acute T2 compression fracture. No significant retropulsion or canal compromise. 2. Chronic L1 compression fracture. 3. Advanced osteoporosis but no other fractures are identified. 4. Moderate spinal and bilateral lateral recess stenosis at L4-5 and L5-S1. 5. Mild spinal and bilateral lateral recess stenosis at L3-4. There is also mild right foraminal stenosis at this level. Aortic Atherosclerosis (ICD10-I70.0). Electronically Signed   By: Marijo Sanes M.D.   On: 01/04/2023 14:24   CT Lumbar Spine Wo Contrast  Result Date: 01/04/2023 CLINICAL  DATA:  [address] in parking lot. EXAM: CT THORACIC AND LUMBAR SPINE WITHOUT CONTRAST TECHNIQUE: Multidetector CT imaging of the thoracic and lumbar spine was performed without contrast. Multiplanar CT image reconstructions were also generated. RADIATION DOSE REDUCTION: This exam was performed according to the departmental dose-optimization program which includes automated exposure control, adjustment of the mA and/or kV according to patient size and/or use  of iterative reconstruction technique. COMPARISON:  Chest CT 12/10/2022 FINDINGS: CT THORACIC SPINE FINDINGS Alignment: Stable thoracic scoliosis and exaggerated thoracic kyphosis. Normal alignment in the sagittal plane. Vertebrae: There is a T2 compression fracture which was not present on the prior chest CT. The L1 compression fracture is chronic. No other thoracic compression fractures are identified. There is significant osteoporosis and changes of DISH. Paraspinal and other soft tissues: No significant paraspinal, posterior mediastinal or posterior long abnormality. A large calcified splenic cyst is noted. Advanced aortic calcifications but no aneurysm. Disc levels: The thoracic spinal canal is generous. No significant canal stenosis. No retropulsion at T2. CT LUMBAR SPINE FINDINGS Segmentation: Transitional lumbar anatomy noted with partial lumbarization of S1. Alignment: Degenerative anterolisthesis of L5. Vertebrae: Remote L1 compression fracture with mild retropulsion of the posterosuperior aspect of vertebral body. Advanced osteoporosis but no other fractures are identified. Advanced multilevel facet disease but no definite pars defects or fractures. Paraspinal and other soft tissues: No significant paraspinal or retroperitoneal findings. Advanced aortic calcifications but no aneurysm. Disc levels: T12-L1: Retropulsion of L1 with flattening of the ventral thecal sac but no significant canal stenosis. L1-2: No significant findings. L2-3: No significant  findings. L3-4: Bulging annulus and advanced facet disease with ligamentum flavum thickening contributing to mild spinal and bilateral lateral recess stenosis. There is also mild right foraminal stenosis. L4-5: Diffuse bulging uncovered disc, advanced facet disease and ligamentum flavum thickening contributing to moderate spinal and bilateral lateral recess stenosis. No significant foraminal stenosis. L5-S1: Bulging uncovered disc and severe facet disease with moderate spinal and bilateral lateral recess stenosis and mild bilateral foraminal stenosis. Small disc space at S1-2 but no significant findings. IMPRESSION: 1. New/acute T2 compression fracture. No significant retropulsion or canal compromise. 2. Chronic L1 compression fracture. 3. Advanced osteoporosis but no other fractures are identified. 4. Moderate spinal and bilateral lateral recess stenosis at L4-5 and L5-S1. 5. Mild spinal and bilateral lateral recess stenosis at L3-4. There is also mild right foraminal stenosis at this level. Aortic Atherosclerosis (ICD10-I70.0). Electronically Signed   By: Marijo Sanes M.D.   On: 01/04/2023 14:24   CT HEAD WO CONTRAST (5MM)  Result Date: 01/04/2023 CLINICAL DATA:  Provided history: Head trauma, minor. EXAM: CT HEAD WITHOUT CONTRAST TECHNIQUE: Contiguous axial images were obtained from the base of the skull through the vertex without intravenous contrast. RADIATION DOSE REDUCTION: This exam was performed according to the departmental dose-optimization program which includes automated exposure control, adjustment of the mA and/or kV according to patient size and/or use of iterative reconstruction technique. COMPARISON:  Head CT 12/10/2022. FINDINGS: Brain: Mild generalized parenchymal atrophy. Acute subdural hematoma along the left aspect of the falx, measuring up to 4 mm in thickness. Mild-to-moderate patchy and ill-defined hypoattenuation within the cerebral white matter, nonspecific but compatible with  chronic small vessel disease. No demarcated cortical infarct. No evidence of an intracranial mass. No midline shift. Vascular: No hyperdense vessel.  Atherosclerotic calcifications. Skull: No fracture or aggressive osseous lesion. Sinuses/Orbits: No mass or acute finding within the imaged orbits. No significant paranasal sinus disease at the imaged levels. Impression #1 called by telephone at the time of interpretation on 01/04/2023 at 2:20 pm to provider JENISE MENSHEW , who verbally acknowledged these results. IMPRESSION: 1. Acute subdural hematoma along the left aspect of the falx, measuring up to 4 mm in thickness. 2. Mild-to-moderate chronic small vessel ischemic changes within the cerebral white matter. 3. Mild generalized parenchymal atrophy. Electronically Signed   By: Kellie Simmering  D.O.   On: 01/04/2023 14:21   DG Chest 2 View  Result Date: 01/04/2023 CLINICAL DATA:  ant chest wall pain EXAM: CHEST - 2 VIEW COMPARISON:  12/10/2022 FINDINGS: Cardiac silhouette is unremarkable. No pneumothorax or pleural effusion. There is pulmonary vascular congestion without focal consolidation. Aorta is calcified. There are thoracic degenerative changes. IMPRESSION: Pulmonary vascular congestion without focal consolidation. Electronically Signed   By: Sammie Bench M.D.   On: 01/04/2023 13:18   DG Hip Unilat W or Wo Pelvis 2-3 Views Left  Result Date: 01/04/2023 CLINICAL DATA:  Status post fall EXAM: DG HIP (WITH OR WITHOUT PELVIS) 2-3V LEFT COMPARISON:  CT AP 08/26/2021 FINDINGS: Both hips appear located. No signs of acute fracture or dislocation. No evidence for pelvic diastasis. Mild bilateral and symmetric osteoarthritis noted within the hips. IMPRESSION: 1. No acute findings. 2. Mild bilateral and symmetric osteoarthritis. Electronically Signed   By: Kerby Moors M.D.   On: 01/04/2023 13:12   ECHOCARDIOGRAM COMPLETE  Result Date: 12/11/2022    ECHOCARDIOGRAM REPORT   Patient Name:   CAITLAN COFRANCESCO  Date of Exam: 12/11/2022 Medical Rec #:  [id number]        Height:       60.0 in Accession #:    [id number]       Weight:       166.0 lb Date of Birth:  04-08-1933        BSA:          1.724 m Patient Age:    53 years         BP:           133/61 mmHg Patient Gender: F                HR:           57 bpm. Exam Location:  [facility] Procedure: 2D Echo and Intracardiac Opacification Agent Indications:     CHF  History:         Patient has prior history of Echocardiogram examinations, most                  recent 03/18/2021. Risk Factors:Hypertension and Dyslipidemia.  Sonographer:     Harvie Junior Referring Phys:  [id number] [place] Console Diagnosing Phys: Isaias Cowman MD  Sonographer Comments: Technically difficult study due to poor echo windows. Image acquisition challenging due to patient body habitus. IMPRESSIONS  1. Left ventricular ejection fraction, by estimation, is 60 to 65%. The left ventricle has normal function. The left ventricle has no regional wall motion abnormalities. Left ventricular diastolic parameters are consistent with Grade I diastolic dysfunction (impaired relaxation).  2. Right ventricular systolic function is normal. The right ventricular size is normal.  3. The mitral valve is normal in structure. Mild mitral valve regurgitation. No evidence of mitral stenosis.  4. The aortic valve is normal in structure. Aortic valve regurgitation is mild. No aortic stenosis is present.  5. The inferior vena cava is normal in size with greater than 50% respiratory variability, suggesting right atrial pressure of 3 mmHg. FINDINGS  Left Ventricle: Left ventricular ejection fraction, by estimation, is 60 to 65%. The left ventricle has normal function. The left ventricle has no regional wall motion abnormalities. Definity contrast agent was given IV to delineate the left ventricular  endocardial borders. The left ventricular internal cavity size was normal in size. There is no left ventricular hypertrophy. Left  ventricular diastolic parameters are consistent with Grade I diastolic dysfunction (impaired  relaxation). Right Ventricle: The right ventricular size is normal. No increase in right ventricular wall thickness. Right ventricular systolic function is normal. Left Atrium: Left atrial size was normal in size. Right Atrium: Right atrial size was normal in size. Pericardium: There is no evidence of pericardial effusion. Mitral Valve: The mitral valve is normal in structure. Mild mitral valve regurgitation. No evidence of mitral valve stenosis. Tricuspid Valve: The tricuspid valve is normal in structure. Tricuspid valve regurgitation is mild . No evidence of tricuspid stenosis. Aortic Valve: The aortic valve is normal in structure. Aortic valve regurgitation is mild. Aortic regurgitation PHT measures 579 msec. No aortic stenosis is present. Aortic valve mean gradient measures 3.0 mmHg. Aortic valve peak gradient measures 6.6 mmHg. Aortic valve area, by VTI measures 2.77 cm. Pulmonic Valve: The pulmonic valve was normal in structure. Pulmonic valve regurgitation is not visualized. No evidence of pulmonic stenosis. Aorta: The aortic root is normal in size and structure. Venous: The inferior vena cava is normal in size with greater than 50% respiratory variability, suggesting right atrial pressure of 3 mmHg. IAS/Shunts: No atrial level shunt detected by color flow Doppler.  LEFT VENTRICLE PLAX 2D LVIDd:         5.00 cm     Diastology LVIDs:         3.00 cm     LV e' medial:    8.27 cm/s LV PW:         1.10 cm     LV E/e' medial:  11.8 LV IVS:        1.10 cm     LV e' lateral:   7.83 cm/s LVOT diam:     1.80 cm     LV E/e' lateral: 12.5 LV SV:         80 LV SV Index:   46 LVOT Area:     2.54 cm                             3D Volume EF: LV Volumes (MOD)           3D EF:        51 % LV vol d, MOD A2C: 37.7 ml LV EDV:       144 ml LV vol d, MOD A4C: 91.2 ml LV ESV:       70 ml LV vol s, MOD A2C: 20.5 ml LV SV:        74 ml LV  vol s, MOD A4C: 32.3 ml LV SV MOD A2C:     17.2 ml LV SV MOD A4C:     91.2 ml LV SV MOD BP:      36.7 ml RIGHT VENTRICLE RV Basal diam:  3.80 cm RV Mid diam:    3.20 cm RV S prime:     15.90 cm/s TAPSE (M-mode): 1.9 cm LEFT ATRIUM           Index        RIGHT ATRIUM           Index LA diam:      3.80 cm 2.20 cm/m   RA Area:     13.50 cm LA Vol (A4C): 55.1 ml 31.95 ml/m  RA Volume:   30.10 ml  17.45 ml/m  AORTIC VALVE                    PULMONIC VALVE AV Area (Vmax):  2.55 cm     PV Vmax:       1.02 m/s AV Area (Vmean):   2.64 cm     PV Peak grad:  4.2 mmHg AV Area (VTI):     2.77 cm AV Vmax:           128.50 cm/s AV Vmean:          84.700 cm/s AV VTI:            0.289 m AV Peak Grad:      6.6 mmHg AV Mean Grad:      3.0 mmHg LVOT Vmax:         129.00 cm/s LVOT Vmean:        88.000 cm/s LVOT VTI:          0.315 m LVOT/AV VTI ratio: 1.09 AI PHT:            579 msec  AORTA Ao Root diam: 3.00 cm MITRAL VALVE MV Area (PHT): 2.80 cm     SHUNTS MV Decel Time: 271 msec     Systemic VTI:  0.32 m MR Peak grad: 71.8 mmHg     Systemic Diam: 1.80 cm MR Vmax:      423.67 cm/s MV E velocity: 97.50 cm/s MV A velocity: 116.00 cm/s MV E/A ratio:  0.84 Isaias Cowman MD Electronically signed by Isaias Cowman MD Signature Date/Time: 12/11/2022/3:01:00 PM    Final    CT Chest Wo Contrast  Result Date: 12/10/2022 CLINICAL DATA:  Chest trauma.  Fall. EXAM: CT CHEST WITHOUT CONTRAST TECHNIQUE: Multidetector CT imaging of the chest was performed following the standard protocol without IV contrast. RADIATION DOSE REDUCTION: This exam was performed according to the departmental dose-optimization program which includes automated exposure control, adjustment of the mA and/or kV according to patient size and/or use of iterative reconstruction technique. COMPARISON:  CT abdomen and pelvis 08/26/2021 FINDINGS: Cardiovascular: Heart is enlarged. There is no pericardial effusion. Aorta is normal in size. There are  atherosclerotic calcifications of the aorta and coronary arteries. Mediastinum/Nodes: No enlarged mediastinal or axillary lymph nodes. Thyroid gland, trachea, and esophagus demonstrate no significant findings. Lungs/Pleura: There is a trace left pleural effusion. There is a 2 mm right upper lobe nodular density image 8/60. There is some linear atelectasis or scarring in the lingula and left lower lobe. Upper Abdomen: Peripherally calcified rounded splenic lesion measures 4 cm and appears unchanged. There is a second hypodense splenic lesion measuring 2 cm which has slightly increased in size. There are punctate right renal calculi. Musculoskeletal: There is moderate severe compression fracture of L1 which appears chronic, but is new from 2022. The bones are osteopenic. 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 IMPRESSION: 1. No acute posttraumatic sequelae in the chest. 2. Trace left pleural effusion. 3. Cardiomegaly. 4. 2 mm right solid pulmonary nodule within the upper lobe. Per Fleischner Society Guidelines, if patient is low risk for malignancy, no routine follow-up imaging is recommended. If patient is high risk for malignancy, a non-contrast Chest CT at 12 months is optional. If performed and the nodule is stable at 12 months, no further follow-up is recommended. These guidelines do not apply to immunocompromised patients and patients with cancer. Follow up in patients with significant comorbidities as clinically warranted. For lung cancer screening, adhere to Lung-RADS guidelines. Reference: Radiology. 2017; 284(1):228-43. 5. Hypodense splenic lesion is increasing in size. Please correlate clinically. Consider further evaluation with ultrasound or MRI. Aortic Atherosclerosis (ICD10-I70.0). Electronically Signed  By: Ronney Asters M.D.   On: 12/10/2022 17:29   CT Cervical Spine Wo Contrast  Result Date: 12/10/2022 CLINICAL DATA:  Trauma EXAM: CT CERVICAL SPINE WITHOUT CONTRAST TECHNIQUE: Multidetector CT  imaging of the cervical spine was performed without intravenous contrast. Multiplanar CT image reconstructions were also generated. RADIATION DOSE REDUCTION: This exam was performed according to the departmental dose-optimization program which includes automated exposure control, adjustment of the mA and/or kV according to patient size and/or use of iterative reconstruction technique. COMPARISON:  CT C Spine 08/26/21 FINDINGS: Alignment: Grade 1 anterolisthesis of C4 on C5 and C5 on C6. This is unchanged compared to 2022. Skull base and vertebrae: No acute fracture. No primary bone lesion or focal pathologic process. Soft tissues and spinal canal: No prevertebral fluid or swelling. No visible canal hematoma. There are large calcified disc bulges at the C4-C5, C5-C6, and C6-C7 levels. These are unchanged compared to prior exam. Disc levels: There is fusion of the bilateral facet joints at C2-C3. Upper chest: Negative. Other: None. IMPRESSION: 1. No acute fracture or traumatic subluxation of the cervical spine. 2. Unchanged grade 1 anterolisthesis of C4 on C5 and C5 on C6. 3. Unchanged large calcified disc bulges at the C4-C5, C5-C6, and C6-C7 levels. Electronically Signed   By: Marin Roberts M.D.   On: 12/10/2022 17:24   CT HEAD WO CONTRAST (5MM)  Result Date: 12/10/2022 CLINICAL DATA:  Head trauma EXAM: CT HEAD WITHOUT CONTRAST TECHNIQUE: Contiguous axial images were obtained from the base of the skull through the vertex without intravenous contrast. RADIATION DOSE REDUCTION: This exam was performed according to the departmental dose-optimization program which includes automated exposure control, adjustment of the mA and/or kV according to patient size and/or use of iterative reconstruction technique. COMPARISON:  CT head 08/26/2020.  MRI brain 08/28/2021. FINDINGS: Brain: No evidence of acute infarction, hemorrhage, hydrocephalus, extra-axial collection or mass lesion/mass effect. Again seen is mild diffuse  atrophy and mild periventricular white matter hypodensity, likely chronic small vessel ischemic change. Vascular: Atherosclerotic calcifications are present within the cavernous internal carotid arteries. Skull: Normal. Negative for fracture or focal lesion. Sinuses/Orbits: No acute finding. Other: None. IMPRESSION: 1. No acute intracranial abnormality. 2. Mild diffuse atrophy and mild chronic small vessel ischemic change. Electronically Signed   By: Ronney Asters M.D.   On: 12/10/2022 17:17   DG Chest 2 View  Result Date: 12/10/2022 CLINICAL DATA:  Shortness of breath EXAM: CHEST - 2 VIEW COMPARISON:  06/10/2022, 08/26/2021 FINDINGS: The heart size and mediastinal contours are within normal limits. Aortic atherosclerosis. Low lung volumes. Mild streaky bibasilar opacities. No pleural effusion or pneumothorax. Calcified splenic cyst is unchanged. Compression fracture at approximately L1, is unchanged from prior. Exaggerated thoracic kyphosis. IMPRESSION: Low lung volumes with mild streaky bibasilar opacities, likely atelectasis. Electronically Signed   By: Davina Poke D.O.   On: 12/10/2022 14:26    Microbiology: Results for orders placed or performed in visit on 06/04/22  Urine Culture     Status: None   Collection Time: 06/04/22 11:13 AM   Specimen: Urine   Urine  Result Value Ref Range Status   Urine Culture, Routine Final report  Final   Organism ID, Bacteria No growth  Final    Labs: CBC: Recent Labs  Lab 01/04/23 1622 01/08/23 0246  WBC 8.6 5.6  NEUTROABS 7.2  --   HGB 10.3* 10.4*  HCT 31.5* 32.0*  MCV 98.7 100.6*  PLT 200 [id number]   Basic Metabolic Panel: Recent Labs  Lab 01/04/23 1622 01/05/23 0608 01/06/23 0112 01/08/23 0246  NA 132* 136 137 135  K 2.8* 4.0 3.8 3.8  CL 90* 98 97* 97*  CO2 '29 30 31 29  '$ GLUCOSE 139* 94 115* 104*  BUN 39* 42* 49* 62*  CREATININE 1.56* 1.37* 1.58* 1.49*  CALCIUM 9.2 8.8* 9.3 9.3  MG  --   --  2.3  --   PHOS  --   --  4.0  --     Liver Function Tests: No results for input(s): "AST", "ALT", "ALKPHOS", "BILITOT", "PROT", "ALBUMIN" in the last 168 hours. CBG: No results for input(s): "GLUCAP" in the last 168 hours.  Discharge time spent: greater than 30 minutes.  This record has been created using Systems analyst. Errors have been sought and corrected,but may not always be located. Such creation errors do not reflect on the standard of care.   Signed: Lorella Nimrod, MD [facility] 01/09/2023

## 2023-01-09 NOTE — TOC Progression Note (Addendum)
Transition of Care [facility]) - Progression Note    Patient Details  Name: Helen Ramsey MRN: [id number] Date of Birth: [birth year]  [facility] CM/SW Contact  Laurena Slimmer, RN Phone Number: 01/09/2023, 9:50 AM  Clinical Narrative:    Damaris Schooner with [facility] at [facility]. Patient can admit to facility today. MD notified.         Expected Discharge Plan and Services                                               Social Determinants of Health (SDOH) Interventions SDOH Screenings   Food Insecurity: No Food Insecurity (01/05/2023)  Housing: Low Risk  (01/05/2023)  Transportation Needs: No Transportation Needs (01/05/2023)  Utilities: Not At Risk (01/05/2023)  Alcohol Screen: Low Risk  (12/18/2022)  Depression (PHQ2-9): Medium Risk (12/18/2022)  Financial Resource Strain: Low Risk  (05/06/2022)  Physical Activity: Insufficiently Active (05/06/2022)  Social Connections: Moderately Isolated (05/06/2022)  Stress: No Stress Concern Present (05/06/2022)  Tobacco Use: Low Risk  (01/04/2023)    Readmission Risk Interventions     No data to display

## 2023-01-09 NOTE — TOC Transition Note (Addendum)
Transition of Care [facility]) - CM/SW Discharge Note   Patient Details  Name: ALIZON KOELLNER MRN: [id number] Date of Birth: [birth year]  [facility] CM/SW Contact:  Laurena Slimmer, RN Phone Number: 01/09/2023, 11:44 AM   Clinical Narrative:    Patient is going to [facility] [phone] Call report to [phone] EMS arranged  Face sheet and medical necessity forms printed to floor please add to EMS packet. [facility] summary and SNF transfer report send in [place] notified, spoke with her daughter, [name]. TOC signing off         Patient Goals and CMS Choice      Discharge Placement                         Discharge Plan and Services Additional resources added to the After Visit Summary for                                       Social Determinants of Health (SDOH) Interventions SDOH Screenings   Food Insecurity: No Food Insecurity (01/05/2023)  Housing: Low Risk  (01/05/2023)  Transportation Needs: No Transportation Needs (01/05/2023)  Utilities: Not At Risk (01/05/2023)  Alcohol Screen: Low Risk  (12/18/2022)  Depression (PHQ2-9): Medium Risk (12/18/2022)  Financial Resource Strain: Low Risk  (05/06/2022)  Physical Activity: Insufficiently Active (05/06/2022)  Social Connections: Moderately Isolated (05/06/2022)  Stress: No Stress Concern Present (05/06/2022)  Tobacco Use: Low Risk  (01/04/2023)     Readmission Risk Interventions     No data to display

## 2023-01-09 NOTE — Care Management Important Message (Signed)
Important Message  Patient Details  Name: Helen Ramsey MRN: [id number] Date of Birth: 1933-11-14   Medicare Important Message Given:  Yes     Darius Bump Jeremia Groot 01/09/2023, 10:43 AM

## 2023-01-12 DIAGNOSIS — I5032 Chronic diastolic (congestive) heart failure: Secondary | ICD-10-CM | POA: Diagnosis not present

## 2023-01-12 DIAGNOSIS — M6281 Muscle weakness (generalized): Secondary | ICD-10-CM | POA: Diagnosis not present

## 2023-01-12 DIAGNOSIS — R296 Repeated falls: Secondary | ICD-10-CM | POA: Diagnosis not present

## 2023-01-12 DIAGNOSIS — I498 Other specified cardiac arrhythmias: Secondary | ICD-10-CM | POA: Diagnosis not present

## 2023-01-12 DIAGNOSIS — E876 Hypokalemia: Secondary | ICD-10-CM | POA: Diagnosis not present

## 2023-01-12 DIAGNOSIS — J9611 Chronic respiratory failure with hypoxia: Secondary | ICD-10-CM | POA: Diagnosis not present

## 2023-01-12 DIAGNOSIS — S12200D Unspecified displaced fracture of third cervical vertebra, subsequent encounter for fracture with routine healing: Secondary | ICD-10-CM | POA: Diagnosis not present

## 2023-01-12 DIAGNOSIS — N1832 Chronic kidney disease, stage 3b: Secondary | ICD-10-CM | POA: Diagnosis not present

## 2023-01-12 DIAGNOSIS — S22020D Wedge compression fracture of second thoracic vertebra, subsequent encounter for fracture with routine healing: Secondary | ICD-10-CM | POA: Diagnosis not present

## 2023-01-12 DIAGNOSIS — I6200 Nontraumatic subdural hemorrhage, unspecified: Secondary | ICD-10-CM | POA: Diagnosis not present

## 2023-01-12 DIAGNOSIS — Z6832 Body mass index (BMI) 32.0-32.9, adult: Secondary | ICD-10-CM | POA: Diagnosis not present

## 2023-01-12 DIAGNOSIS — L89150 Pressure ulcer of sacral region, unstageable: Secondary | ICD-10-CM | POA: Diagnosis not present

## 2023-01-13 DIAGNOSIS — K219 Gastro-esophageal reflux disease without esophagitis: Secondary | ICD-10-CM | POA: Diagnosis not present

## 2023-01-13 DIAGNOSIS — E876 Hypokalemia: Secondary | ICD-10-CM | POA: Diagnosis not present

## 2023-01-13 DIAGNOSIS — L89150 Pressure ulcer of sacral region, unstageable: Secondary | ICD-10-CM | POA: Diagnosis not present

## 2023-01-13 DIAGNOSIS — S12200D Unspecified displaced fracture of third cervical vertebra, subsequent encounter for fracture with routine healing: Secondary | ICD-10-CM | POA: Diagnosis not present

## 2023-01-13 DIAGNOSIS — S22020D Wedge compression fracture of second thoracic vertebra, subsequent encounter for fracture with routine healing: Secondary | ICD-10-CM | POA: Diagnosis not present

## 2023-01-13 DIAGNOSIS — I5032 Chronic diastolic (congestive) heart failure: Secondary | ICD-10-CM | POA: Diagnosis not present

## 2023-01-13 DIAGNOSIS — I498 Other specified cardiac arrhythmias: Secondary | ICD-10-CM | POA: Diagnosis not present

## 2023-01-13 DIAGNOSIS — J9611 Chronic respiratory failure with hypoxia: Secondary | ICD-10-CM | POA: Diagnosis not present

## 2023-01-13 DIAGNOSIS — Z6832 Body mass index (BMI) 32.0-32.9, adult: Secondary | ICD-10-CM | POA: Diagnosis not present

## 2023-01-13 DIAGNOSIS — I6200 Nontraumatic subdural hemorrhage, unspecified: Secondary | ICD-10-CM | POA: Diagnosis not present

## 2023-01-13 DIAGNOSIS — N1832 Chronic kidney disease, stage 3b: Secondary | ICD-10-CM | POA: Diagnosis not present

## 2023-01-13 DIAGNOSIS — M6281 Muscle weakness (generalized): Secondary | ICD-10-CM | POA: Diagnosis not present

## 2023-01-16 DIAGNOSIS — M6281 Muscle weakness (generalized): Secondary | ICD-10-CM | POA: Diagnosis not present

## 2023-01-16 DIAGNOSIS — I6200 Nontraumatic subdural hemorrhage, unspecified: Secondary | ICD-10-CM | POA: Diagnosis not present

## 2023-01-16 DIAGNOSIS — N1832 Chronic kidney disease, stage 3b: Secondary | ICD-10-CM | POA: Diagnosis not present

## 2023-01-16 DIAGNOSIS — J9611 Chronic respiratory failure with hypoxia: Secondary | ICD-10-CM | POA: Diagnosis not present

## 2023-01-16 DIAGNOSIS — S22020D Wedge compression fracture of second thoracic vertebra, subsequent encounter for fracture with routine healing: Secondary | ICD-10-CM | POA: Diagnosis not present

## 2023-01-16 DIAGNOSIS — S12200D Unspecified displaced fracture of third cervical vertebra, subsequent encounter for fracture with routine healing: Secondary | ICD-10-CM | POA: Diagnosis not present

## 2023-01-16 DIAGNOSIS — I5032 Chronic diastolic (congestive) heart failure: Secondary | ICD-10-CM | POA: Diagnosis not present

## 2023-01-16 DIAGNOSIS — L89150 Pressure ulcer of sacral region, unstageable: Secondary | ICD-10-CM | POA: Diagnosis not present

## 2023-01-16 DIAGNOSIS — R079 Chest pain, unspecified: Secondary | ICD-10-CM | POA: Diagnosis not present

## 2023-01-16 DIAGNOSIS — I498 Other specified cardiac arrhythmias: Secondary | ICD-10-CM | POA: Diagnosis not present

## 2023-01-16 DIAGNOSIS — Z6832 Body mass index (BMI) 32.0-32.9, adult: Secondary | ICD-10-CM | POA: Diagnosis not present

## 2023-01-16 DIAGNOSIS — E876 Hypokalemia: Secondary | ICD-10-CM | POA: Diagnosis not present

## 2023-01-19 DIAGNOSIS — J9611 Chronic respiratory failure with hypoxia: Secondary | ICD-10-CM | POA: Diagnosis not present

## 2023-01-19 DIAGNOSIS — I6200 Nontraumatic subdural hemorrhage, unspecified: Secondary | ICD-10-CM | POA: Diagnosis not present

## 2023-01-19 DIAGNOSIS — E876 Hypokalemia: Secondary | ICD-10-CM | POA: Diagnosis not present

## 2023-01-19 DIAGNOSIS — R079 Chest pain, unspecified: Secondary | ICD-10-CM | POA: Diagnosis not present

## 2023-01-19 DIAGNOSIS — L89150 Pressure ulcer of sacral region, unstageable: Secondary | ICD-10-CM | POA: Diagnosis not present

## 2023-01-19 DIAGNOSIS — I5032 Chronic diastolic (congestive) heart failure: Secondary | ICD-10-CM | POA: Diagnosis not present

## 2023-01-19 DIAGNOSIS — S12200D Unspecified displaced fracture of third cervical vertebra, subsequent encounter for fracture with routine healing: Secondary | ICD-10-CM | POA: Diagnosis not present

## 2023-01-19 DIAGNOSIS — S22020D Wedge compression fracture of second thoracic vertebra, subsequent encounter for fracture with routine healing: Secondary | ICD-10-CM | POA: Diagnosis not present

## 2023-01-19 DIAGNOSIS — M6281 Muscle weakness (generalized): Secondary | ICD-10-CM | POA: Diagnosis not present

## 2023-01-19 DIAGNOSIS — I498 Other specified cardiac arrhythmias: Secondary | ICD-10-CM | POA: Diagnosis not present

## 2023-01-19 DIAGNOSIS — N1832 Chronic kidney disease, stage 3b: Secondary | ICD-10-CM | POA: Diagnosis not present

## 2023-01-19 DIAGNOSIS — Z6832 Body mass index (BMI) 32.0-32.9, adult: Secondary | ICD-10-CM | POA: Diagnosis not present

## 2023-01-19 NOTE — Progress Notes (Unsigned)
Referring Physician:  No referring provider defined for this encounter.  Primary Physician:  Virginia Crews, MD  History of Present Illness: 01/22/2023 [name] has a history of  chronic hypoxic respiratory failure on 2 L of supplemental oxygen, HFpEF, osteoporosis, CKD stage IIIb, morphea on methotrexate.   Seen by Dr. Izora Ribas for a hospital consult on 01/05/23 for small subdural hematoma and stable C3 fracture s/p fall on 01/04/23.   He did not recommend further imaging for either issue. She was to wear cervical collar for comfort only.   She is doing well. She has history of chronic headaches- these are some worse as she missed her emgality when she was in the hospital. No vision changes or dizziness.   Overall, she feels good.   Review of Systems:  A 10 point review of systems is negative, except for the pertinent positives and negatives detailed in the HPI.  Past Medical History: Past Medical History:  Diagnosis Date   Abnormal SPEP    Allergic rhinitis, cause unspecified    Allergic rhinitis, cause unspecified    Atherosclerosis of aorta (HCC)    Back pain    lumbar   Degeneration of lumbar or lumbosacral intervertebral disc    Diverticulitis of colon (without mention of hemorrhage)(562.11)    Dizziness and giddiness    Dysthymic disorder    Headache(784.0)    Myalgia and myositis, unspecified    Osteoporosis, unspecified    Other and unspecified hyperlipidemia    Other diseases of nasal cavity and sinuses(478.19)    Other specified disorders of bladder    Syncope    Trigger finger (acquired)    Unspecified essential hypertension    Unspecified sleep apnea    Urinary frequency     Past Surgical History: Past Surgical History:  Procedure Laterality Date   ABDOMINAL HYSTERECTOMY     ADENOIDECTOMY     APPENDECTOMY     ARTHROSCOPIC KNEE SURGERY     BREAST BIOPSY Right    lymph node surgery, benign    BREAST EXCISIONAL BIOPSY     CARDIAC  CATHETERIZATION     [facility]   CARPAL TUNNEL RELEASE     CATARACT EXTRACTION W/ INTRAOCULAR LENS IMPLANT & ANTERIOR VITRECTOMY, BILATERAL     CHOLECYSTECTOMY     KNEE ARTHROPLASTY Right 01/30/2016   Procedure: COMPUTER ASSISTED TOTAL KNEE ARTHROPLASTY;  Surgeon: Dereck Leep, MD;  Location: [facility] ORS;  Service: Orthopedics;  Laterality: Right;   R/O Lymph Nodes; Right Axilla     ROTATOR CUFF REPAIR Right    SPG block  03/20/2016   sphenopalatine ganglion and maxillary division of trigeminal nerve block block Dr. Manuella Ghazi   TEMPORAL ARTERY BIOPSY / LIGATION     TONSILLECTOMY     TUBAL LIGATION      Allergies: Allergies as of 01/22/2023 - Review Complete 01/04/2023  Allergen Reaction Noted   Amoxicillin  07/06/2014   Clindamycin/lincomycin  07/06/2014   Doxycycline  07/06/2014   Levaquin [levofloxacin in d5w]  07/06/2014   Mirabegron Nausea And Vomiting 05/09/2015   Nsaids Other (See Comments) 05/09/2015   Oxycodone Nausea Only 03/31/2016   Phenergan [promethazine hcl]  07/06/2014   Singulair [montelukast]  07/06/2014   Vicodin [hydrocodone-acetaminophen]  07/06/2014   Zocor [simvastatin]  07/06/2014   Zoloft  [sertraline] Other (See Comments) 03/28/2015   Ampicillin Rash 05/09/2015   Bactrim [sulfamethoxazole-trimethoprim] Rash 12/13/2015   Levofloxacin Other (See Comments) 10/17/2014   Penicillins Rash 03/28/2015   Tolmetin  05/09/2015  Medications: Outpatient Encounter Medications as of 01/22/2023  Medication Sig   acetaminophen (TYLENOL) 500 MG tablet Take 1,000 mg by mouth in the morning and at bedtime.   albuterol (VENTOLIN HFA) 108 (90 Base) MCG/ACT inhaler Inhale 2 puffs into the lungs every 6 (six) hours as needed for wheezing or shortness of breath.   ALPRAZolam (XANAX) 0.25 MG tablet TAKE 1 TABLET BY MOUTH TWICE DAILY AS NEEDED FOR ANXIETY   aspirin EC 81 MG tablet Take 81 mg by mouth daily.   atorvastatin (LIPITOR) 20 MG tablet Take 1 tablet (20 mg total) by mouth  daily.   Cholecalciferol (VITAMIN D3 PO) Take 1,000 Units by mouth.   citalopram (CELEXA) 40 MG tablet Take 1 tablet (40 mg total) by mouth daily.   cyanocobalamin 1000 MCG tablet Take 1,000 mcg by mouth daily.   Dentifrices (FLUORIDE TOOTHPASTE DT) Place onto teeth daily.    diphenhydrAMINE (BENADRYL) 25 MG tablet Take 1 tablet (25 mg total) by mouth at bedtime as needed for sleep.   famotidine (PEPCID) 20 MG tablet Take 1 tablet (20 mg total) by mouth daily.   ferrous sulfate 325 (65 FE) MG tablet Take 325 mg by mouth daily with supper.   fluticasone furoate-vilanterol (BREO ELLIPTA) 100-25 MCG/ACT AEPB Inhale 1 puff into the lungs daily.   folic acid (FOLVITE) 1 MG tablet Take 1 mg by mouth daily.   gabapentin (NEURONTIN) 100 MG capsule Take 2 capsules (200 mg total) by mouth 2 (two) times daily AND 3 capsules (300 mg total) at bedtime.   glycerin adult 2 g suppository Place 1 suppository rectally as needed for constipation.   hydroxychloroquine (PLAQUENIL) 200 MG tablet Take 200 mg by mouth daily.   levothyroxine (SYNTHROID) 50 MCG tablet TAKE 1 TABLET(50 MCG) BY MOUTH DAILY BEFORE BREAKFAST (Patient taking differently: Take 50 mcg by mouth daily before breakfast. TAKE 1 TABLET(50 MCG) BY MOUTH DAILY BEFORE BREAKFAST)   linaclotide (LINZESS) 145 MCG CAPS capsule Take 1 capsule (145 mcg total) by mouth daily before breakfast.   MAGNESIUM GLYCINATE PO Take 400 mg by mouth daily.   magnesium hydroxide (MILK OF MAGNESIA) 400 MG/5ML suspension Take 5 mLs by mouth daily as needed for mild constipation.   melatonin 5 MG TABS Take 5 mg by mouth at bedtime as needed.   methotrexate (RHEUMATREX) 2.5 MG tablet Take 10 mg by mouth once a week.   nystatin (MYCOSTATIN/NYSTOP) powder Apply 1 Application topically 3 (three) times daily.   Omega-3 Fatty Acids (FISH OIL) 1200 MG CAPS Take 1,200 mg by mouth daily.   omeprazole (PRILOSEC) 20 MG capsule TAKE 1 CAPSULE(20 MG) BY MOUTH DAILY   phenazopyridine  (AZO-TABS) 95 MG tablet Take 1 tablet (95 mg total) by mouth as needed. (Patient taking differently: Take 95 mg by mouth 2 (two) times daily.)   Polyethylene Glycol 3350 (MIRALAX PO) Take 17 g by mouth daily.   potassium gluconate 595 (99 K) MG TABS tablet Take 1 tablet (595 mg total) by mouth 2 (two) times daily.   senna (SENOKOT) 8.6 MG tablet Take 1 tablet (8.6 mg total) by mouth daily.   sodium chloride (OCEAN) 0.65 % nasal spray Place 1 spray into the nose as needed.   Torsemide 40 MG TABS Take 40 mg by mouth 2 (two) times daily.   traMADol (ULTRAM) 50 MG tablet Take 1 tablet (50 mg total) by mouth 3 (three) times daily.   triamcinolone cream (KENALOG) 0.1 % 2 weeks on and off 2 weeks  No facility-administered encounter medications on file as of 01/22/2023.    Social History: Social History   Tobacco Use   Smoking status: Never   Smokeless tobacco: Never  Vaping Use   Vaping Use: Never used  Substance Use Topics   Alcohol use: No   Drug use: No    Family Medical History: Family History  Problem Relation Age of Onset   Heart disease Mother    Depression Mother    Colon polyps Mother    Dementia Mother    Heart disease Father    Dementia Father    Heart disease Brother    Hypertension Brother    Heart disease Brother    Hypertension Brother    Kidney cancer Neg Hx    Bladder Cancer Neg Hx     Physical Examination: There were no vitals filed for this visit.  General: Patient is well developed, well nourished, calm, collected, and in no apparent distress. Attention to examination is appropriate.  Respiratory: Patient is breathing without any difficulty.  She is in O2 via [place].    NEUROLOGICAL:     Awake, alert, oriented to person, place, and time.  Speech is clear and fluent. Fund of knowledge is appropriate.   Cranial Nerves: Pupils equal round and reactive to light.  Facial tone is symmetric.    No posterior cervical tenderness.   No thoracic tenderness is  noted.   No abnormal lesions on exposed skin.   Strength: Side Biceps Triceps Deltoid Interossei Grip Wrist Ext. Wrist Flex.  R '5 5 5 5 5 5 5  '$ L '5 5 5 5 5 5 5   '$ Side Iliopsoas Quads Hamstring PF DF EHL  R '5 5 5 5 5 5  '$ L '5 5 5 5 5 5   '$ Reflexes are 2+ and symmetric at the biceps, triceps, brachioradialis, patella and achilles.   Hoffman's is absent.  Clonus is not present.   Bilateral upper and lower extremity sensation is intact to light touch.     She ambulates with a rolling walker.   Medical Decision Making  Imaging: CT of head dated 01/04/23:  FINDINGS: Brain: Redemonstrated subdural hematoma along the left aspect of the falx and along the tentorium, which measures up to 3 mm, unchanged when remeasured similarly. No significant mass effect or midline shift.   No evidence of acute infarction, parenchymal hemorrhage, mass, or hydrocephalus. Periventricular white matter changes, likely the sequela of chronic small vessel ischemic disease.   Vascular: No hyperdense vessel.   Skull: Negative for fracture or focal lesion.   Sinuses/Orbits: No acute finding. Status post bilateral lens replacements.   Other: The mastoid air cells are well aerated.   IMPRESSION: Unchanged 3 mm subdural hematoma along the left aspect of the falx and tentorium. No significant mass effect or midline shift.     Electronically Signed   By: Merilyn Baba M.D.   On: 01/04/2023 20:51  CT of cervical spine dated 01/04/23:  FINDINGS: Alignment: Stable alignment of the cervical vertebral bodies. Degenerative anterior subluxations at C4 and C5 are unchanged.   Skull base and vertebrae: Advanced degenerative changes at C1-2 with pannus formation but no significant mass effect on the upper cervical cord. There is an acute fracture involving the right lamina and spinous process of C3. The pedicles are intact. No facet fractures. The facets are normally aligned. There is also a compression  fracture T2.   Soft tissues and spinal canal: No prevertebral fluid or swelling. No  visible canal hematoma.   Disc levels:  Stable calcified disc protrusions at C4-5 and C5-6.   Upper chest: The lung apices are grossly clear.   Other: No neck mass or adenopathy or hematoma. Bilateral carotid artery calcifications.   IMPRESSION: 1. Acute fracture involving the right lamina and spinous process of C3. 2. Compression fracture of T2. 3. Stable degenerative anterior subluxations at C4 and C5. 4. Advanced degenerative changes at C1-2 with pannus formation but no significant mass effect on the upper cervical cord. 5. Stable calcified disc protrusions at C4-5 and C5-6.     Electronically Signed   By: Marijo Sanes M.D.   On: 01/04/2023 14:33   I have personally reviewed the images and agree with the above interpretation.  Assessment and Plan: Ms. Perrault is a pleasant 89 y.o. female was seen by Dr. Izora Ribas for a hospital consult on 01/05/23 for small subdural hematoma and stable C3 fracture s/p fall on 01/04/23.   She is doing well and has no dizziness or vision changes. She has chronic headaches- have been some worse as she was off her emgality.   She has known C3 fracture involving the right lamina and spinous process along with compression fracture of T2. Also with 3 mm subdural hematoma.   Treatment options discussed with patient and following plan made:   - She can wear soft collar for comfort.  - No heavy lifting (does not do anyway).  - Care with bending and twisting.  - No follow imaging needed per Dr. Izora Ribas as she is doing well.  - She can follow up prn.   I spent a total of 15 minutes in face-to-face and non-face-to-face activities related to this patient's care today including review of outside records, review of imaging, review of symptoms, physical exam, discussion of differential diagnosis, discussion of treatment options, and documentation.   Geronimo Boot  PA-C Dept. of Neurosurgery

## 2023-01-22 ENCOUNTER — Ambulatory Visit (INDEPENDENT_AMBULATORY_CARE_PROVIDER_SITE_OTHER): Payer: Medicare Other | Admitting: Orthopedic Surgery

## 2023-01-22 ENCOUNTER — Encounter: Payer: Self-pay | Admitting: Orthopedic Surgery

## 2023-01-22 VITALS — BP 130/62 | Ht 59.0 in | Wt 159.0 lb

## 2023-01-22 DIAGNOSIS — W19XXXD Unspecified fall, subsequent encounter: Secondary | ICD-10-CM | POA: Diagnosis not present

## 2023-01-22 DIAGNOSIS — S22020D Wedge compression fracture of second thoracic vertebra, subsequent encounter for fracture with routine healing: Secondary | ICD-10-CM

## 2023-01-22 DIAGNOSIS — S065XAA Traumatic subdural hemorrhage with loss of consciousness status unknown, initial encounter: Secondary | ICD-10-CM

## 2023-01-22 DIAGNOSIS — S065X0D Traumatic subdural hemorrhage without loss of consciousness, subsequent encounter: Secondary | ICD-10-CM

## 2023-01-22 DIAGNOSIS — S12201D Unspecified nondisplaced fracture of third cervical vertebra, subsequent encounter for fracture with routine healing: Secondary | ICD-10-CM | POA: Diagnosis not present

## 2023-01-26 DIAGNOSIS — Z79631 Long term (current) use of antimetabolite agent: Secondary | ICD-10-CM | POA: Diagnosis not present

## 2023-01-26 DIAGNOSIS — N1832 Chronic kidney disease, stage 3b: Secondary | ICD-10-CM | POA: Diagnosis not present

## 2023-01-26 DIAGNOSIS — E669 Obesity, unspecified: Secondary | ICD-10-CM | POA: Diagnosis not present

## 2023-01-26 DIAGNOSIS — W1830XD Fall on same level, unspecified, subsequent encounter: Secondary | ICD-10-CM | POA: Diagnosis not present

## 2023-01-26 DIAGNOSIS — J309 Allergic rhinitis, unspecified: Secondary | ICD-10-CM | POA: Diagnosis not present

## 2023-01-26 DIAGNOSIS — Z7982 Long term (current) use of aspirin: Secondary | ICD-10-CM | POA: Diagnosis not present

## 2023-01-26 DIAGNOSIS — K219 Gastro-esophageal reflux disease without esophagitis: Secondary | ICD-10-CM | POA: Diagnosis not present

## 2023-01-26 DIAGNOSIS — K5904 Chronic idiopathic constipation: Secondary | ICD-10-CM | POA: Diagnosis not present

## 2023-01-26 DIAGNOSIS — R55 Syncope and collapse: Secondary | ICD-10-CM | POA: Diagnosis not present

## 2023-01-26 DIAGNOSIS — Z9181 History of falling: Secondary | ICD-10-CM | POA: Diagnosis not present

## 2023-01-26 DIAGNOSIS — M8008XD Age-related osteoporosis with current pathological fracture, vertebra(e), subsequent encounter for fracture with routine healing: Secondary | ICD-10-CM | POA: Diagnosis not present

## 2023-01-26 DIAGNOSIS — S065X0D Traumatic subdural hemorrhage without loss of consciousness, subsequent encounter: Secondary | ICD-10-CM | POA: Diagnosis not present

## 2023-01-26 DIAGNOSIS — Z79891 Long term (current) use of opiate analgesic: Secondary | ICD-10-CM | POA: Diagnosis not present

## 2023-01-26 DIAGNOSIS — Z9981 Dependence on supplemental oxygen: Secondary | ICD-10-CM | POA: Diagnosis not present

## 2023-01-26 DIAGNOSIS — Z6832 Body mass index (BMI) 32.0-32.9, adult: Secondary | ICD-10-CM | POA: Diagnosis not present

## 2023-01-26 DIAGNOSIS — L940 Localized scleroderma [morphea]: Secondary | ICD-10-CM | POA: Diagnosis not present

## 2023-01-26 DIAGNOSIS — I5032 Chronic diastolic (congestive) heart failure: Secondary | ICD-10-CM | POA: Diagnosis not present

## 2023-01-26 DIAGNOSIS — I130 Hypertensive heart and chronic kidney disease with heart failure and stage 1 through stage 4 chronic kidney disease, or unspecified chronic kidney disease: Secondary | ICD-10-CM | POA: Diagnosis not present

## 2023-01-26 DIAGNOSIS — E039 Hypothyroidism, unspecified: Secondary | ICD-10-CM | POA: Diagnosis not present

## 2023-01-26 DIAGNOSIS — E782 Mixed hyperlipidemia: Secondary | ICD-10-CM | POA: Diagnosis not present

## 2023-01-26 DIAGNOSIS — J9611 Chronic respiratory failure with hypoxia: Secondary | ICD-10-CM | POA: Diagnosis not present

## 2023-01-26 DIAGNOSIS — M4854XD Collapsed vertebra, not elsewhere classified, thoracic region, subsequent encounter for fracture with routine healing: Secondary | ICD-10-CM | POA: Diagnosis not present

## 2023-01-26 DIAGNOSIS — R296 Repeated falls: Secondary | ICD-10-CM | POA: Diagnosis not present

## 2023-01-26 DIAGNOSIS — M150 Primary generalized (osteo)arthritis: Secondary | ICD-10-CM | POA: Diagnosis not present

## 2023-01-26 DIAGNOSIS — R001 Bradycardia, unspecified: Secondary | ICD-10-CM | POA: Diagnosis not present

## 2023-01-28 ENCOUNTER — Emergency Department: Payer: Medicare Other

## 2023-01-28 ENCOUNTER — Other Ambulatory Visit: Payer: Self-pay

## 2023-01-28 ENCOUNTER — Inpatient Hospital Stay
Admission: EM | Admit: 2023-01-28 | Discharge: 2023-02-03 | DRG: 178 | Disposition: A | Discharge status: Skilled Nursing Facility | Payer: Medicare Other | Attending: Internal Medicine | Admitting: Internal Medicine

## 2023-01-28 ENCOUNTER — Telehealth: Payer: Self-pay

## 2023-01-28 DIAGNOSIS — M5136 Other intervertebral disc degeneration, lumbar region: Secondary | ICD-10-CM | POA: Diagnosis not present

## 2023-01-28 DIAGNOSIS — J9611 Chronic respiratory failure with hypoxia: Secondary | ICD-10-CM | POA: Diagnosis present

## 2023-01-28 DIAGNOSIS — Z6832 Body mass index (BMI) 32.0-32.9, adult: Secondary | ICD-10-CM | POA: Diagnosis not present

## 2023-01-28 DIAGNOSIS — E039 Hypothyroidism, unspecified: Secondary | ICD-10-CM | POA: Diagnosis present

## 2023-01-28 DIAGNOSIS — U071 COVID-19: Principal | ICD-10-CM | POA: Diagnosis present

## 2023-01-28 DIAGNOSIS — D6959 Other secondary thrombocytopenia: Secondary | ICD-10-CM | POA: Diagnosis present

## 2023-01-28 DIAGNOSIS — Z9981 Dependence on supplemental oxygen: Secondary | ICD-10-CM | POA: Diagnosis not present

## 2023-01-28 DIAGNOSIS — I130 Hypertensive heart and chronic kidney disease with heart failure and stage 1 through stage 4 chronic kidney disease, or unspecified chronic kidney disease: Secondary | ICD-10-CM | POA: Diagnosis present

## 2023-01-28 DIAGNOSIS — Z66 Do not resuscitate: Secondary | ICD-10-CM | POA: Diagnosis present

## 2023-01-28 DIAGNOSIS — I10 Essential (primary) hypertension: Secondary | ICD-10-CM | POA: Diagnosis not present

## 2023-01-28 DIAGNOSIS — Z96651 Presence of right artificial knee joint: Secondary | ICD-10-CM | POA: Diagnosis present

## 2023-01-28 DIAGNOSIS — Z885 Allergy status to narcotic agent status: Secondary | ICD-10-CM

## 2023-01-28 DIAGNOSIS — M810 Age-related osteoporosis without current pathological fracture: Secondary | ICD-10-CM | POA: Diagnosis present

## 2023-01-28 DIAGNOSIS — F419 Anxiety disorder, unspecified: Secondary | ICD-10-CM | POA: Diagnosis present

## 2023-01-28 DIAGNOSIS — D696 Thrombocytopenia, unspecified: Secondary | ICD-10-CM | POA: Diagnosis present

## 2023-01-28 DIAGNOSIS — E669 Obesity, unspecified: Secondary | ICD-10-CM | POA: Diagnosis present

## 2023-01-28 DIAGNOSIS — Z881 Allergy status to other antibiotic agents status: Secondary | ICD-10-CM

## 2023-01-28 DIAGNOSIS — R001 Bradycardia, unspecified: Secondary | ICD-10-CM | POA: Diagnosis present

## 2023-01-28 DIAGNOSIS — R296 Repeated falls: Secondary | ICD-10-CM | POA: Diagnosis present

## 2023-01-28 DIAGNOSIS — E559 Vitamin D deficiency, unspecified: Secondary | ICD-10-CM | POA: Diagnosis not present

## 2023-01-28 DIAGNOSIS — I82431 Acute embolism and thrombosis of right popliteal vein: Secondary | ICD-10-CM | POA: Diagnosis not present

## 2023-01-28 DIAGNOSIS — Z79891 Long term (current) use of opiate analgesic: Secondary | ICD-10-CM

## 2023-01-28 DIAGNOSIS — R531 Weakness: Secondary | ICD-10-CM

## 2023-01-28 DIAGNOSIS — Z888 Allergy status to other drugs, medicaments and biological substances status: Secondary | ICD-10-CM

## 2023-01-28 DIAGNOSIS — G2581 Restless legs syndrome: Secondary | ICD-10-CM | POA: Diagnosis not present

## 2023-01-28 DIAGNOSIS — Z79899 Other long term (current) drug therapy: Secondary | ICD-10-CM

## 2023-01-28 DIAGNOSIS — Z7951 Long term (current) use of inhaled steroids: Secondary | ICD-10-CM | POA: Diagnosis not present

## 2023-01-28 DIAGNOSIS — Z9989 Dependence on other enabling machines and devices: Secondary | ICD-10-CM

## 2023-01-28 DIAGNOSIS — Z9181 History of falling: Secondary | ICD-10-CM

## 2023-01-28 DIAGNOSIS — Z90710 Acquired absence of both cervix and uterus: Secondary | ICD-10-CM

## 2023-01-28 DIAGNOSIS — I5032 Chronic diastolic (congestive) heart failure: Secondary | ICD-10-CM | POA: Diagnosis present

## 2023-01-28 DIAGNOSIS — L940 Localized scleroderma [morphea]: Secondary | ICD-10-CM | POA: Diagnosis present

## 2023-01-28 DIAGNOSIS — G3184 Mild cognitive impairment, so stated: Secondary | ICD-10-CM | POA: Insufficient documentation

## 2023-01-28 DIAGNOSIS — G4700 Insomnia, unspecified: Secondary | ICD-10-CM | POA: Diagnosis not present

## 2023-01-28 DIAGNOSIS — F418 Other specified anxiety disorders: Secondary | ICD-10-CM | POA: Diagnosis not present

## 2023-01-28 DIAGNOSIS — Z7901 Long term (current) use of anticoagulants: Secondary | ICD-10-CM | POA: Diagnosis not present

## 2023-01-28 DIAGNOSIS — K219 Gastro-esophageal reflux disease without esophagitis: Secondary | ICD-10-CM | POA: Diagnosis not present

## 2023-01-28 DIAGNOSIS — S065X0D Traumatic subdural hemorrhage without loss of consciousness, subsequent encounter: Secondary | ICD-10-CM | POA: Diagnosis not present

## 2023-01-28 DIAGNOSIS — K5190 Ulcerative colitis, unspecified, without complications: Secondary | ICD-10-CM | POA: Diagnosis not present

## 2023-01-28 DIAGNOSIS — Z880 Allergy status to penicillin: Secondary | ICD-10-CM

## 2023-01-28 DIAGNOSIS — R509 Fever, unspecified: Secondary | ICD-10-CM | POA: Diagnosis not present

## 2023-01-28 DIAGNOSIS — R0689 Other abnormalities of breathing: Secondary | ICD-10-CM | POA: Diagnosis not present

## 2023-01-28 DIAGNOSIS — Z87820 Personal history of traumatic brain injury: Secondary | ICD-10-CM

## 2023-01-28 DIAGNOSIS — M1990 Unspecified osteoarthritis, unspecified site: Secondary | ICD-10-CM

## 2023-01-28 DIAGNOSIS — Z7982 Long term (current) use of aspirin: Secondary | ICD-10-CM | POA: Diagnosis not present

## 2023-01-28 DIAGNOSIS — Z8616 Personal history of COVID-19: Secondary | ICD-10-CM | POA: Diagnosis not present

## 2023-01-28 DIAGNOSIS — E785 Hyperlipidemia, unspecified: Secondary | ICD-10-CM | POA: Diagnosis present

## 2023-01-28 DIAGNOSIS — E876 Hypokalemia: Secondary | ICD-10-CM | POA: Diagnosis not present

## 2023-01-28 DIAGNOSIS — I959 Hypotension, unspecified: Secondary | ICD-10-CM | POA: Diagnosis not present

## 2023-01-28 DIAGNOSIS — N1832 Chronic kidney disease, stage 3b: Secondary | ICD-10-CM | POA: Diagnosis present

## 2023-01-28 DIAGNOSIS — Z79890 Hormone replacement therapy (postmenopausal): Secondary | ICD-10-CM

## 2023-01-28 DIAGNOSIS — I5030 Unspecified diastolic (congestive) heart failure: Secondary | ICD-10-CM | POA: Diagnosis not present

## 2023-01-28 DIAGNOSIS — Z9049 Acquired absence of other specified parts of digestive tract: Secondary | ICD-10-CM

## 2023-01-28 DIAGNOSIS — Z886 Allergy status to analgesic agent status: Secondary | ICD-10-CM

## 2023-01-28 DIAGNOSIS — Z7401 Bed confinement status: Secondary | ICD-10-CM | POA: Diagnosis not present

## 2023-01-28 DIAGNOSIS — M5481 Occipital neuralgia: Secondary | ICD-10-CM | POA: Diagnosis not present

## 2023-01-28 DIAGNOSIS — Z83719 Family history of colon polyps, unspecified: Secondary | ICD-10-CM

## 2023-01-28 DIAGNOSIS — R918 Other nonspecific abnormal finding of lung field: Secondary | ICD-10-CM | POA: Diagnosis not present

## 2023-01-28 DIAGNOSIS — Z602 Problems related to living alone: Secondary | ICD-10-CM | POA: Diagnosis present

## 2023-01-28 DIAGNOSIS — E7800 Pure hypercholesterolemia, unspecified: Secondary | ICD-10-CM | POA: Diagnosis not present

## 2023-01-28 DIAGNOSIS — Z79620 Long term (current) use of immunosuppressive biologic: Secondary | ICD-10-CM | POA: Diagnosis not present

## 2023-01-28 DIAGNOSIS — R4182 Altered mental status, unspecified: Secondary | ICD-10-CM | POA: Diagnosis not present

## 2023-01-28 DIAGNOSIS — W19XXXD Unspecified fall, subsequent encounter: Secondary | ICD-10-CM | POA: Diagnosis not present

## 2023-01-28 DIAGNOSIS — Z8249 Family history of ischemic heart disease and other diseases of the circulatory system: Secondary | ICD-10-CM

## 2023-01-28 DIAGNOSIS — Z818 Family history of other mental and behavioral disorders: Secondary | ICD-10-CM

## 2023-01-28 LAB — COMPREHENSIVE METABOLIC PANEL
ALT: 36 U/L (ref 0–44)
AST: 37 U/L (ref 15–41)
Albumin: 3.5 g/dL (ref 3.5–5.0)
Alkaline Phosphatase: 100 U/L (ref 38–126)
Anion gap: 13 (ref 5–15)
BUN: 25 mg/dL — ABNORMAL HIGH (ref 8–23)
CO2: 29 mmol/L (ref 22–32)
Calcium: 8.7 mg/dL — ABNORMAL LOW (ref 8.9–10.3)
Chloride: 94 mmol/L — ABNORMAL LOW (ref 98–111)
Creatinine, Ser: 1.45 mg/dL — ABNORMAL HIGH (ref 0.44–1.00)
GFR, Estimated: 35 mL/min — ABNORMAL LOW (ref >60)
Glucose, Bld: 115 mg/dL — ABNORMAL HIGH (ref 70–99)
Potassium: 3.7 mmol/L (ref 3.5–5.1)
Sodium: 136 mmol/L (ref 135–145)
Total Bilirubin: 0.5 mg/dL (ref 0.3–1.2)
Total Protein: 6.8 g/dL (ref 6.5–8.1)

## 2023-01-28 LAB — URINALYSIS, W/ REFLEX TO CULTURE (INFECTION SUSPECTED)
Bilirubin Urine: NEGATIVE
Glucose, UA: NEGATIVE mg/dL
Hgb urine dipstick: NEGATIVE
Ketones, ur: NEGATIVE mg/dL
Nitrite: NEGATIVE
Protein, ur: 100 mg/dL — AB
Specific Gravity, Urine: 1.011 (ref 1.005–1.030)
pH: 7 (ref 5.0–8.0)

## 2023-01-28 LAB — CBC WITH DIFFERENTIAL/PLATELET
Abs Immature Granulocytes: 0.02 10*3/uL (ref 0.00–0.07)
Basophils Absolute: 0 10*3/uL (ref 0.0–0.1)
Basophils Relative: 0 %
Eosinophils Absolute: 0.1 10*3/uL (ref 0.0–0.5)
Eosinophils Relative: 1 %
HCT: 34.9 % — ABNORMAL LOW (ref 36.0–46.0)
Hemoglobin: 11.4 g/dL — ABNORMAL LOW (ref 12.0–15.0)
Immature Granulocytes: 0 %
Lymphocytes Relative: 13 %
Lymphs Abs: 0.6 10*3/uL — ABNORMAL LOW (ref 0.7–4.0)
MCH: 32.6 pg (ref 26.0–34.0)
MCHC: 32.7 g/dL (ref 30.0–36.0)
MCV: 99.7 fL (ref 80.0–100.0)
Monocytes Absolute: 0.7 10*3/uL (ref 0.1–1.0)
Monocytes Relative: 16 %
Neutro Abs: 3.1 10*3/uL (ref 1.7–7.7)
Neutrophils Relative %: 70 %
Platelets: 149 10*3/uL — ABNORMAL LOW (ref 150–400)
RBC: 3.5 MIL/uL — ABNORMAL LOW (ref 3.87–5.11)
RDW: 13.5 % (ref 11.5–15.5)
WBC: 4.5 10*3/uL (ref 4.0–10.5)
nRBC: 0 % (ref 0.0–0.2)

## 2023-01-28 LAB — RESP PANEL BY RT-PCR (RSV, FLU A&B, COVID)  RVPGX2
Influenza A by PCR: NEGATIVE
Influenza B by PCR: NEGATIVE
Resp Syncytial Virus by PCR: NEGATIVE
SARS Coronavirus 2 by RT PCR: POSITIVE — AB

## 2023-01-28 LAB — LACTIC ACID, PLASMA
Lactic Acid, Venous: 2.1 mmol/L (ref 0.5–1.9)
Lactic Acid, Venous: 1.3 mmol/L (ref 0.5–1.9)

## 2023-01-28 MED ORDER — VANCOMYCIN HCL IN DEXTROSE 1-5 GM/200ML-% IV SOLN
1000.0000 mg | Freq: Once | INTRAVENOUS | Status: AC
  Administered 2023-01-28: 1000 mg via INTRAVENOUS
  Filled 2023-01-28: qty 200

## 2023-01-28 MED ORDER — NIRMATRELVIR/RITONAVIR (PAXLOVID) TABLET (RENAL DOSING)
2.0000 | ORAL_TABLET | Freq: Two times a day (BID) | ORAL | Status: DC
  Administered 2023-01-29 – 2023-02-01 (×9): 2 via ORAL
  Filled 2023-01-28: qty 20

## 2023-01-28 MED ORDER — SODIUM CHLORIDE 0.9 % IV SOLN
2.0000 g | Freq: Once | INTRAVENOUS | Status: AC
  Administered 2023-01-28: 2 g via INTRAVENOUS
  Filled 2023-01-28: qty 12.5

## 2023-01-28 NOTE — ED Provider Notes (Signed)
[facility] Provider Note    Event Date/Time   First MD Initiated Contact with Patient 01/28/23 1859     (approximate)   History   Weakness   HPI  Helen Ramsey is a 89 y.o. female who presents to the emergency department today because of concerns for weakness.  Some history is obtained from family at bedside.  Apparently first noticed some weakness yesterday.  Then again today the patient was have a hard time getting up.  Did feel slightly warm today although no measured fevers.  The patient denies any urinary symptoms.  No nausea or vomiting.  Patient has chronic cough but states this was no different.  Family has noticed some increased confusion today as well.  No known sick contacts.     Physical Exam   Triage Vital Signs: ED Triage Vitals  Enc Vitals Group     BP 01/28/23 1906 (!) 138/100     Pulse Rate 01/28/23 1906 64     Resp 01/28/23 1906 20     Temp 01/28/23 1849 (!) 101.1 F (38.4 C)     Temp src --      SpO2 01/28/23 1906 94 %     Weight --      Height --      Head Circumference --      Peak Flow --      Pain Score 01/28/23 1848 0     Pain Loc --      Pain Edu? --      Excl. in [place]? --     Most recent vital signs: Vitals:   01/28/23 1849 01/28/23 1906  BP:  (!) 138/100  Pulse:  64  Resp:  20  Temp: (!) 101.1 F (38.4 C)   SpO2:  94%   General: Awake, alert, oriented. CV:  Good peripheral perfusion. Regular rate and rhythm. Resp:  Normal effort. Lungs clear. Abd:  No distention.     ED Results / Procedures / Treatments   Labs (all labs ordered are listed, but only abnormal results are displayed) Labs Reviewed  RESP PANEL BY RT-PCR (RSV, FLU A&B, COVID)  RVPGX2 - Abnormal; Notable for the following components:      Result Value   SARS Coronavirus 2 by RT PCR POSITIVE (*)    All other components within normal limits  LACTIC ACID, PLASMA - Abnormal; Notable for the following components:   Lactic Acid, Venous 2.1  (*)    All other components within normal limits  COMPREHENSIVE METABOLIC PANEL - Abnormal; Notable for the following components:   Chloride 94 (*)    Glucose, Bld 115 (*)    BUN 25 (*)    Creatinine, Ser 1.45 (*)    Calcium 8.7 (*)    GFR, Estimated 35 (*)    All other components within normal limits  CBC WITH DIFFERENTIAL/PLATELET - Abnormal; Notable for the following components:   RBC 3.50 (*)    Hemoglobin 11.4 (*)    HCT 34.9 (*)    Platelets 149 (*)    Lymphs Abs 0.6 (*)    All other components within normal limits  URINALYSIS, W/ REFLEX TO CULTURE (INFECTION SUSPECTED) - Abnormal; Notable for the following components:   Color, Urine YELLOW (*)    APPearance HAZY (*)    Protein, ur 100 (*)    Leukocytes,Ua MODERATE (*)    Bacteria, UA RARE (*)    All other components within normal limits  CULTURE, BLOOD (  ROUTINE X 2)  CULTURE, BLOOD (ROUTINE X 2)  LACTIC ACID, PLASMA     EKG  I, Nance Pear, attending physician, personally viewed and interpreted this EKG  EKG Time: 1928 Rate: 66 Rhythm: sinus rhythm Axis: left axis deviation Intervals: qtc 484 QRS: q waves III, aVF ST changes: no st elevation Impression: abnormal ekg   RADIOLOGY I independently interpreted and visualized the  CXR. My interpretation: No pneumonia Radiology interpretation:  IMPRESSION:  Low lung volumes with streaky basilar opacities, atelectasis versus  scarring.          PROCEDURES:  Critical Care performed: [place] IN ED: Medications - No data to display   IMPRESSION / MDM / [place] / ED COURSE  I reviewed the triage vital signs and the nursing notes.                              Differential diagnosis includes, but is not limited to, UTI, pneumonia, covid, flu  Patient's presentation is most consistent with acute presentation with potential threat to life or bodily function.   The patient is on the cardiac monitor to evaluate for  evidence of arrhythmia and/or significant heart rate changes.  Patient presents to the emergency department today because of concerns for weakness.  Upon arrival patient was found to be febrile.      FINAL CLINICAL IMPRESSION(S) / ED DIAGNOSES   Final diagnoses:  None     Rx / DC Orders   ED Discharge Orders     None        Note:  This document was prepared using Dragon voice recognition software and may include unintentional dictation errors.

## 2023-01-28 NOTE — Telephone Encounter (Signed)
Copied from [place] [phone]. Topic: Referral - Request for Referral >> Jan 28, 2023  2:06 PM Barbie Haggis wrote: Daughter in law [name] stated patient had a fall last night (EMS was called and she is okay) and they would like to speak with doctor in regards to putting her in a nursing facility [facility])  Also would like to get another DNR form, son accidentally tore it up.

## 2023-01-28 NOTE — ED Notes (Signed)
Critical lactate 2.1 reported to MD Archie Balboa

## 2023-01-28 NOTE — ED Triage Notes (Signed)
[facility] reports pt coming from [facility] c/o weakness that comes and goes. Pt states she does not feeling herself.. EMS states pt temp 102.5.

## 2023-01-29 ENCOUNTER — Encounter: Payer: Self-pay | Admitting: Internal Medicine

## 2023-01-29 DIAGNOSIS — I959 Hypotension, unspecified: Secondary | ICD-10-CM | POA: Diagnosis not present

## 2023-01-29 DIAGNOSIS — Z9181 History of falling: Secondary | ICD-10-CM | POA: Diagnosis not present

## 2023-01-29 DIAGNOSIS — E559 Vitamin D deficiency, unspecified: Secondary | ICD-10-CM | POA: Diagnosis not present

## 2023-01-29 DIAGNOSIS — E7800 Pure hypercholesterolemia, unspecified: Secondary | ICD-10-CM | POA: Diagnosis not present

## 2023-01-29 DIAGNOSIS — Z6832 Body mass index (BMI) 32.0-32.9, adult: Secondary | ICD-10-CM | POA: Diagnosis not present

## 2023-01-29 DIAGNOSIS — Z79891 Long term (current) use of opiate analgesic: Secondary | ICD-10-CM | POA: Diagnosis not present

## 2023-01-29 DIAGNOSIS — W19XXXD Unspecified fall, subsequent encounter: Secondary | ICD-10-CM | POA: Diagnosis not present

## 2023-01-29 DIAGNOSIS — Z8616 Personal history of COVID-19: Secondary | ICD-10-CM | POA: Diagnosis not present

## 2023-01-29 DIAGNOSIS — Z66 Do not resuscitate: Secondary | ICD-10-CM | POA: Diagnosis present

## 2023-01-29 DIAGNOSIS — G2581 Restless legs syndrome: Secondary | ICD-10-CM | POA: Diagnosis not present

## 2023-01-29 DIAGNOSIS — U071 COVID-19: Secondary | ICD-10-CM | POA: Diagnosis present

## 2023-01-29 DIAGNOSIS — I130 Hypertensive heart and chronic kidney disease with heart failure and stage 1 through stage 4 chronic kidney disease, or unspecified chronic kidney disease: Secondary | ICD-10-CM | POA: Diagnosis present

## 2023-01-29 DIAGNOSIS — F418 Other specified anxiety disorders: Secondary | ICD-10-CM | POA: Diagnosis not present

## 2023-01-29 DIAGNOSIS — Z79899 Other long term (current) drug therapy: Secondary | ICD-10-CM | POA: Diagnosis not present

## 2023-01-29 DIAGNOSIS — E039 Hypothyroidism, unspecified: Secondary | ICD-10-CM | POA: Diagnosis present

## 2023-01-29 DIAGNOSIS — Z79620 Long term (current) use of immunosuppressive biologic: Secondary | ICD-10-CM | POA: Diagnosis not present

## 2023-01-29 DIAGNOSIS — R001 Bradycardia, unspecified: Secondary | ICD-10-CM | POA: Diagnosis not present

## 2023-01-29 DIAGNOSIS — R296 Repeated falls: Secondary | ICD-10-CM

## 2023-01-29 DIAGNOSIS — M810 Age-related osteoporosis without current pathological fracture: Secondary | ICD-10-CM | POA: Diagnosis present

## 2023-01-29 DIAGNOSIS — K5190 Ulcerative colitis, unspecified, without complications: Secondary | ICD-10-CM | POA: Diagnosis not present

## 2023-01-29 DIAGNOSIS — E785 Hyperlipidemia, unspecified: Secondary | ICD-10-CM | POA: Diagnosis present

## 2023-01-29 DIAGNOSIS — Z96651 Presence of right artificial knee joint: Secondary | ICD-10-CM | POA: Diagnosis present

## 2023-01-29 DIAGNOSIS — Z7982 Long term (current) use of aspirin: Secondary | ICD-10-CM | POA: Diagnosis not present

## 2023-01-29 DIAGNOSIS — I5030 Unspecified diastolic (congestive) heart failure: Secondary | ICD-10-CM | POA: Diagnosis not present

## 2023-01-29 DIAGNOSIS — Z7951 Long term (current) use of inhaled steroids: Secondary | ICD-10-CM | POA: Diagnosis not present

## 2023-01-29 DIAGNOSIS — Z79890 Hormone replacement therapy (postmenopausal): Secondary | ICD-10-CM | POA: Diagnosis not present

## 2023-01-29 DIAGNOSIS — R531 Weakness: Secondary | ICD-10-CM | POA: Diagnosis present

## 2023-01-29 DIAGNOSIS — L940 Localized scleroderma [morphea]: Secondary | ICD-10-CM | POA: Diagnosis present

## 2023-01-29 DIAGNOSIS — E669 Obesity, unspecified: Secondary | ICD-10-CM | POA: Diagnosis present

## 2023-01-29 DIAGNOSIS — J9611 Chronic respiratory failure with hypoxia: Secondary | ICD-10-CM | POA: Diagnosis present

## 2023-01-29 DIAGNOSIS — N1832 Chronic kidney disease, stage 3b: Secondary | ICD-10-CM | POA: Diagnosis present

## 2023-01-29 DIAGNOSIS — M5481 Occipital neuralgia: Secondary | ICD-10-CM | POA: Diagnosis not present

## 2023-01-29 DIAGNOSIS — F419 Anxiety disorder, unspecified: Secondary | ICD-10-CM | POA: Diagnosis present

## 2023-01-29 DIAGNOSIS — K219 Gastro-esophageal reflux disease without esophagitis: Secondary | ICD-10-CM | POA: Diagnosis not present

## 2023-01-29 DIAGNOSIS — S065X0D Traumatic subdural hemorrhage without loss of consciousness, subsequent encounter: Secondary | ICD-10-CM | POA: Diagnosis not present

## 2023-01-29 DIAGNOSIS — Z7401 Bed confinement status: Secondary | ICD-10-CM | POA: Diagnosis not present

## 2023-01-29 DIAGNOSIS — I82431 Acute embolism and thrombosis of right popliteal vein: Secondary | ICD-10-CM | POA: Diagnosis present

## 2023-01-29 DIAGNOSIS — I5032 Chronic diastolic (congestive) heart failure: Secondary | ICD-10-CM | POA: Diagnosis present

## 2023-01-29 DIAGNOSIS — G4700 Insomnia, unspecified: Secondary | ICD-10-CM | POA: Diagnosis not present

## 2023-01-29 DIAGNOSIS — E876 Hypokalemia: Secondary | ICD-10-CM | POA: Diagnosis not present

## 2023-01-29 DIAGNOSIS — M5136 Other intervertebral disc degeneration, lumbar region: Secondary | ICD-10-CM | POA: Diagnosis not present

## 2023-01-29 DIAGNOSIS — D696 Thrombocytopenia, unspecified: Secondary | ICD-10-CM | POA: Diagnosis not present

## 2023-01-29 DIAGNOSIS — Z9981 Dependence on supplemental oxygen: Secondary | ICD-10-CM | POA: Diagnosis not present

## 2023-01-29 DIAGNOSIS — D6959 Other secondary thrombocytopenia: Secondary | ICD-10-CM | POA: Diagnosis present

## 2023-01-29 DIAGNOSIS — Z7901 Long term (current) use of anticoagulants: Secondary | ICD-10-CM | POA: Diagnosis not present

## 2023-01-29 LAB — BASIC METABOLIC PANEL
Anion gap: 9 (ref 5–15)
BUN: 23 mg/dL (ref 8–23)
CO2: 30 mmol/L (ref 22–32)
Calcium: 8.7 mg/dL — ABNORMAL LOW (ref 8.9–10.3)
Chloride: 96 mmol/L — ABNORMAL LOW (ref 98–111)
Creatinine, Ser: 1.24 mg/dL — ABNORMAL HIGH (ref 0.44–1.00)
GFR, Estimated: 42 mL/min — ABNORMAL LOW (ref >60)
Glucose, Bld: 89 mg/dL (ref 70–99)
Potassium: 3.6 mmol/L (ref 3.5–5.1)
Sodium: 135 mmol/L (ref 135–145)

## 2023-01-29 LAB — CBC
HCT: 33.8 % — ABNORMAL LOW (ref 36.0–46.0)
Hemoglobin: 11 g/dL — ABNORMAL LOW (ref 12.0–15.0)
MCH: 32.3 pg (ref 26.0–34.0)
MCHC: 32.5 g/dL (ref 30.0–36.0)
MCV: 99.1 fL (ref 80.0–100.0)
Platelets: 113 10*3/uL — ABNORMAL LOW (ref 150–400)
RBC: 3.41 MIL/uL — ABNORMAL LOW (ref 3.87–5.11)
RDW: 13.5 % (ref 11.5–15.5)
WBC: 4.4 10*3/uL (ref 4.0–10.5)
nRBC: 0 % (ref 0.0–0.2)

## 2023-01-29 MED ORDER — CITALOPRAM HYDROBROMIDE 10 MG PO TABS
20.0000 mg | ORAL_TABLET | Freq: Every day | ORAL | Status: DC
  Administered 2023-01-30 – 2023-02-03 (×5): 20 mg via ORAL
  Filled 2023-01-29 (×5): qty 2

## 2023-01-29 MED ORDER — TORSEMIDE 20 MG PO TABS
20.0000 mg | ORAL_TABLET | Freq: Two times a day (BID) | ORAL | Status: DC
  Administered 2023-01-29 – 2023-02-03 (×10): 20 mg via ORAL
  Filled 2023-01-29 (×8): qty 1
  Filled 2023-01-29 (×2): qty 2
  Filled 2023-01-29 (×2): qty 1

## 2023-01-29 MED ORDER — TRAMADOL HCL 50 MG PO TABS
50.0000 mg | ORAL_TABLET | Freq: Four times a day (QID) | ORAL | Status: DC | PRN
  Administered 2023-01-29 – 2023-02-03 (×11): 50 mg via ORAL
  Filled 2023-01-29 (×11): qty 1

## 2023-01-29 MED ORDER — POLYETHYLENE GLYCOL 3350 17 G PO PACK
17.0000 g | PACK | Freq: Every day | ORAL | Status: DC
  Administered 2023-01-29 – 2023-02-03 (×4): 17 g via ORAL
  Filled 2023-01-29 (×4): qty 1

## 2023-01-29 MED ORDER — FAMOTIDINE 20 MG PO TABS
20.0000 mg | ORAL_TABLET | Freq: Every day | ORAL | Status: DC
  Administered 2023-01-29 – 2023-02-02 (×5): 20 mg via ORAL
  Filled 2023-01-29 (×6): qty 1

## 2023-01-29 MED ORDER — ACETAMINOPHEN 650 MG RE SUPP: 650.0000 mg | Freq: Four times a day (QID) | RECTAL | Status: DC | PRN

## 2023-01-29 MED ORDER — LINACLOTIDE 145 MCG PO CAPS
145.0000 ug | ORAL_CAPSULE | Freq: Every day | ORAL | Status: DC
  Administered 2023-01-29 – 2023-02-03 (×5): 145 ug via ORAL
  Filled 2023-01-29 (×6): qty 1

## 2023-01-29 MED ORDER — MAGNESIUM HYDROXIDE 400 MG/5ML PO SUSP: 5.0000 mL | Freq: Every day | ORAL | Status: DC | PRN

## 2023-01-29 MED ORDER — HYDROXYCHLOROQUINE SULFATE 200 MG PO TABS
200.0000 mg | ORAL_TABLET | Freq: Every day | ORAL | Status: DC
  Administered 2023-01-29 – 2023-02-03 (×6): 200 mg via ORAL
  Filled 2023-01-29 (×7): qty 1

## 2023-01-29 MED ORDER — ALPRAZOLAM 0.25 MG PO TABS
0.2500 mg | ORAL_TABLET | Freq: Once | ORAL | Status: AC
  Administered 2023-01-29: 0.25 mg via ORAL
  Filled 2023-01-29: qty 1

## 2023-01-29 MED ORDER — ENOXAPARIN SODIUM 30 MG/0.3ML IJ SOSY
30.0000 mg | PREFILLED_SYRINGE | INTRAMUSCULAR | Status: DC
  Administered 2023-01-30 – 2023-02-02 (×4): 30 mg via SUBCUTANEOUS
  Filled 2023-01-29 (×4): qty 0.3

## 2023-01-29 MED ORDER — PANTOPRAZOLE SODIUM 40 MG PO TBEC
40.0000 mg | DELAYED_RELEASE_TABLET | Freq: Every day | ORAL | Status: DC
  Administered 2023-01-29 – 2023-02-03 (×6): 40 mg via ORAL
  Filled 2023-01-29 (×6): qty 1

## 2023-01-29 MED ORDER — ACETAMINOPHEN 325 MG PO TABS
650.0000 mg | ORAL_TABLET | Freq: Four times a day (QID) | ORAL | Status: DC | PRN
  Administered 2023-01-29 – 2023-02-02 (×7): 650 mg via ORAL
  Filled 2023-01-29 (×8): qty 2

## 2023-01-29 MED ORDER — ENOXAPARIN SODIUM 40 MG/0.4ML IJ SOSY
40.0000 mg | PREFILLED_SYRINGE | INTRAMUSCULAR | Status: DC
  Administered 2023-01-29: 40 mg via SUBCUTANEOUS
  Filled 2023-01-29: qty 0.4

## 2023-01-29 MED ORDER — LEVOTHYROXINE SODIUM 50 MCG PO TABS
50.0000 ug | ORAL_TABLET | Freq: Every day | ORAL | Status: DC
  Administered 2023-01-29 – 2023-02-03 (×6): 50 ug via ORAL
  Filled 2023-01-29 (×6): qty 1

## 2023-01-29 MED ORDER — PHENOL 1.4 % MT LIQD
1.0000 | OROMUCOSAL | Status: DC | PRN
  Administered 2023-01-29 – 2023-02-02 (×2): 1 via OROMUCOSAL
  Filled 2023-01-29: qty 177

## 2023-01-29 MED ORDER — SODIUM CHLORIDE 0.9 % IV SOLN: INTRAVENOUS | Status: AC

## 2023-01-29 MED ORDER — METHOTREXATE 2.5 MG PO TABS: 10.0000 mg | ORAL_TABLET | ORAL | Status: DC

## 2023-01-29 MED ORDER — ONDANSETRON HCL 4 MG PO TABS: 4.0000 mg | ORAL_TABLET | Freq: Four times a day (QID) | ORAL | Status: DC | PRN

## 2023-01-29 MED ORDER — METHOTREXATE SODIUM 2.5 MG PO TABS: 10.0000 mg | ORAL_TABLET | ORAL | Status: DC

## 2023-01-29 MED ORDER — ASPIRIN 81 MG PO TBEC
81.0000 mg | DELAYED_RELEASE_TABLET | Freq: Every day | ORAL | Status: DC
  Administered 2023-01-29 – 2023-02-02 (×5): 81 mg via ORAL
  Filled 2023-01-29 (×5): qty 1

## 2023-01-29 MED ORDER — FLUTICASONE FUROATE-VILANTEROL 100-25 MCG/ACT IN AEPB
1.0000 | INHALATION_SPRAY | Freq: Every day | RESPIRATORY_TRACT | Status: DC
  Administered 2023-01-30 – 2023-02-03 (×5): 1 via RESPIRATORY_TRACT
  Filled 2023-01-29: qty 28

## 2023-01-29 MED ORDER — ATORVASTATIN CALCIUM 20 MG PO TABS
20.0000 mg | ORAL_TABLET | Freq: Every day | ORAL | Status: DC
  Administered 2023-01-29: 20 mg via ORAL
  Filled 2023-01-29: qty 1

## 2023-01-29 MED ORDER — ONDANSETRON HCL 4 MG/2ML IJ SOLN: 4.0000 mg | Freq: Four times a day (QID) | INTRAMUSCULAR | Status: DC | PRN

## 2023-01-29 MED ORDER — ALBUTEROL SULFATE HFA 108 (90 BASE) MCG/ACT IN AERS: 1.0000 | INHALATION_SPRAY | Freq: Four times a day (QID) | RESPIRATORY_TRACT | Status: DC | PRN

## 2023-01-29 MED ORDER — CITALOPRAM HYDROBROMIDE 10 MG PO TABS
40.0000 mg | ORAL_TABLET | Freq: Every day | ORAL | Status: DC
  Administered 2023-01-29: 40 mg via ORAL
  Filled 2023-01-29: qty 4

## 2023-01-29 MED ORDER — ALBUTEROL SULFATE (2.5 MG/3ML) 0.083% IN NEBU: 2.5000 mg | INHALATION_SOLUTION | Freq: Four times a day (QID) | RESPIRATORY_TRACT | Status: DC | PRN

## 2023-01-29 MED ORDER — SALINE SPRAY 0.65 % NA SOLN
1.0000 | NASAL | Status: DC | PRN
  Administered 2023-01-31 – 2023-02-03 (×4): 1 via NASAL
  Filled 2023-01-29: qty 44

## 2023-01-29 NOTE — TOC Progression Note (Signed)
Transition of Care [facility]) - Progression Note    Patient Details  Name: Helen Ramsey MRN: [id number] Date of Birth: [birth year]  Transition of Care [facility]) CM/SW [place], RN Phone Number: 01/29/2023, 10:40 AM  Clinical Narrative:     Spoke with Son [name] in the room I explained that the patient will need to have a 10 day quarantine for Covid prior to going to SNF, he reports that she was recently at [facility] for 2 weeks, He stated that they want to go back to [facility] and afterwards look into possibly going to Long term care at [facility] as well, I explained that once her quarantine is almost up we will do a bedsearch and ask [facility] if they can accept her back  I encouraged him to get her applied for Medicaid sooner verses later, he stated that she does currently have some form of medicaid and he will check into it, he is agreeable to the plan  Expected Discharge Plan: Skilled Nursing Facility Barriers to Discharge: SNF Pending bed offer  Expected Discharge Plan and Services   Discharge Planning Services: CM Consult   Living arrangements for the past 2 months: Apartment                                       Social Determinants of Health (SDOH) Interventions SDOH Screenings   Food Insecurity: No Food Insecurity (01/29/2023)  Housing: Low Risk  (01/29/2023)  Transportation Needs: No Transportation Needs (01/29/2023)  Utilities: Not At Risk (01/29/2023)  Alcohol Screen: Low Risk  (12/18/2022)  Depression (PHQ2-9): Medium Risk (12/18/2022)  Financial Resource Strain: Low Risk  (05/06/2022)  Physical Activity: Insufficiently Active (05/06/2022)  Social Connections: Moderately Isolated (05/06/2022)  Stress: No Stress Concern Present (05/06/2022)  Tobacco Use: Low Risk  (01/29/2023)    Readmission Risk Interventions     No data to display

## 2023-01-29 NOTE — Assessment & Plan Note (Signed)
Continue levothyroxine 

## 2023-01-29 NOTE — TOC Progression Note (Signed)
Transition of Care [facility]) - Progression Note    Patient Details  Name: Helen Ramsey MRN: [id number] Date of Birth: [birth year]  Transition of Care [facility]) CM/SW [place], RN Phone Number: 01/29/2023, 11:27 AM  Clinical Narrative:     Son [name] requested a call from me, I called and reiterated how long medicaid takes and to go ahead and apply, I also reminded him of the 10 day quarantine He reminded me that she wants to go to [facility] Reminded him of the 20 free days from [facility] Then will be in copay days until day 100 Afterwards they are thinking about going to long term care   Expected Discharge Plan: Skilled Nursing Facility Barriers to Discharge: SNF Pending bed offer  Expected Discharge Plan and Services   Discharge Planning Services: CM Consult   Living arrangements for the past 2 months: Apartment                                       Social Determinants of Health (SDOH) Interventions SDOH Screenings   Food Insecurity: No Food Insecurity (01/29/2023)  Housing: Low Risk  (01/29/2023)  Transportation Needs: No Transportation Needs (01/29/2023)  Utilities: Not At Risk (01/29/2023)  Alcohol Screen: Low Risk  (12/18/2022)  Depression (PHQ2-9): Medium Risk (12/18/2022)  Financial Resource Strain: Low Risk  (05/06/2022)  Physical Activity: Insufficiently Active (05/06/2022)  Social Connections: Moderately Isolated (05/06/2022)  Stress: No Stress Concern Present (05/06/2022)  Tobacco Use: Low Risk  (01/29/2023)    Readmission Risk Interventions     No data to display

## 2023-01-29 NOTE — Assessment & Plan Note (Signed)
At baseline O2 requirement Continue supplemental oxygen

## 2023-01-29 NOTE — Telephone Encounter (Signed)
Happy to do a virtual visit and chat about it. Alternatively, we can place CCM referral and get nurse working on an [facility] form and searching for placement. I'll sign a gold DNR form if someone can locate one and then they can pick it up

## 2023-01-29 NOTE — Assessment & Plan Note (Signed)
Renal function at baseline 

## 2023-01-29 NOTE — Progress Notes (Signed)
Arrived to 1A room 135 from Emergency Department. Oriented to room and call bell. Settled into bed and vitals obtained.

## 2023-01-29 NOTE — Assessment & Plan Note (Addendum)
Generalized weakness Symptomatic for generalized weakness and fever starting 2 days prior.   Has a chronic cough at baseline and denies shortness of breath Started on Paxlovid from the ED which we will continue Continue home oxygen.  Currently on baseline requirement Airborne precaution Physical therapy evaluation

## 2023-01-29 NOTE — TOC Progression Note (Addendum)
Transition of Care [facility]) - Progression Note    Patient Details  Name: Helen Ramsey MRN: [id number] Date of Birth: February 11, 1933  Transition of Care [facility]) CM/SW [place], RN Phone Number: 01/29/2023, 3:12 PM  Clinical Narrative:    Received notice that the patient does not meet criteria for Inpatient, she shows to have the [place] banner that would indicate that she has the waiver to go to [facility]  I sent a secure VM to [facility] to confirm, I reached out to [facility] and asked if they could take her under the 10 day quarantine, I am awaiting a response   Received confirmation that The patient is 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 and eligible for the waiver as long as they are not a Long Term Resident at a nursing home, she in fact is not but has been to [place] [facility] recently and would like to return  Expected Discharge Plan: [place] Barriers to Discharge: SNF Pending bed offer  Expected Discharge Plan and Services   Discharge Planning Services: CM Consult   Living arrangements for the past 2 months: Apartment                                       Social Determinants of Health (SDOH) Interventions SDOH Screenings   Food Insecurity: No Food Insecurity (01/29/2023)  Housing: Low Risk  (01/29/2023)  Transportation Needs: No Transportation Needs (01/29/2023)  Utilities: Not At Risk (01/29/2023)  Alcohol Screen: Low Risk  (12/18/2022)  Depression (PHQ2-9): Medium Risk (12/18/2022)  Financial Resource Strain: Low Risk  (05/06/2022)  Physical Activity: Insufficiently Active (05/06/2022)  Social Connections: Moderately Isolated (05/06/2022)  Stress: No Stress Concern Present (05/06/2022)  Tobacco Use: Low Risk  (01/29/2023)    Readmission Risk Interventions     No data to display

## 2023-01-29 NOTE — Progress Notes (Signed)
PHARMACIST - PHYSICIAN COMMUNICATION  CONCERNING:  Enoxaparin (Lovenox) for DVT Prophylaxis    RECOMMENDATION: Patient was prescribed enoxaprin '40mg'$  q24 hours for VTE prophylaxis.   Filed Weights   01/28/23 2042 01/28/23 2130  Weight: 72.1 kg (158 lb 15.2 oz) 72.1 kg (158 lb 15.2 oz)    Body mass index is 32.1 kg/m.  Estimated Creatinine Clearance: 27.7 mL/min (A) (by C-G formula based on SCr of 1.24 mg/dL (H)).   Patient is candidate for enoxaparin '30mg'$  every 24 hours based on CrCl <40m/min   DESCRIPTION: [facility].  Patient is now receiving enoxaparin 30 mg every 24 hours    SPernell Dupre PharmD, BCPS Clinical Pharmacist 01/29/2023 8:07 AM

## 2023-01-29 NOTE — ED Notes (Addendum)
Pts son, Helen Ramsey, wanted to confirm the pt had the correct contact information saved in her chart. This tech looked it up and confirmed we had the correct information saved, but still told pts family member that a note will be made in the pts chart.  Helen Ramsey is the pts POA and should be called FIRST [phone]  Helen Ramsey [phone] should be contacted if Helen Ramsey does not pick up the phone.

## 2023-01-29 NOTE — Assessment & Plan Note (Signed)
Blood pressure somewhat soft, will decrease torsemide

## 2023-01-29 NOTE — Evaluation (Signed)
Physical Therapy Evaluation Patient Details Name: Helen Ramsey MRN: [id number] DOB: 1933/07/08 Today's Date: 01/29/2023  History of Present Illness  Pt admitted for covid. Recent hospital stay recently from 2/18- 2/23 with spinal fx at C3 and T2. Per chart, was prescribed soft collar for comfort only. History includes HF, CRF on 2L, and CKD.  Clinical Impression  Pt is a pleasant 89 year old female who was admitted for covid. Per patient, she has had multiple falls at home and recently has felt weaker; having difficulty with mobility tasks. Recently hospitalization for falls and related spinal fracture. Pt seated at EOB upon arrival. Pt performs transfers and ambulation with cga and RW. Pt demonstrates deficits with weakness/balance/mobility. O2 removed for ambulation with sats WNL on RA. Acute deficits mostly related to covid and anticipate quick recovery. Due to extended quarantine, may be able to upgrade to [place] pending progress. Patient and family are requesting to return back to SNF at [facility] if needed. Would benefit from skilled PT to address above deficits and promote optimal return to PLOF; recommend transition to STR upon discharge from acute hospitalization.      Recommendations for follow up therapy are one component of a multi-disciplinary discharge planning process, led by the attending physician.  Recommendations may be updated based on patient status, additional functional criteria and insurance authorization.  Follow Up Recommendations Skilled nursing-short term rehab (<3 hours/day) Can patient physically be transported by private vehicle: Yes    Assistance Recommended at Discharge Intermittent Supervision/Assistance  Patient can return home with the following  A little help with walking and/or transfers;A little help with bathing/dressing/bathroom;Direct supervision/assist for medications management;Assist for transportation;Help with stairs or ramp for entrance     Equipment Recommendations None recommended by PT  Recommendations for Other Services       Functional Status Assessment Patient has had a recent decline in their functional status and demonstrates the ability to make significant improvements in function in a reasonable and predictable amount of time.     Precautions / Restrictions Precautions Precautions: Cervical;Back Precaution Booklet Issued: No Precaution Comments: precautions for comfort Cervical Brace:  (no collar in room) Restrictions Weight Bearing Restrictions: No      Mobility  Bed Mobility               General bed mobility comments: received seated at EOB upon arrival    Transfers Overall transfer level: Needs assistance Equipment used: Rolling walker (2 wheels) Transfers: Sit to/from Stand Sit to Stand: Min guard           General transfer comment: able to stand from normal bed height, needs close supervision. Chose to perform mobility without O2 donned with O2 sats at 94% with exertion    Ambulation/Gait Ambulation/Gait assistance: Min guard Gait Distance (Feet): 40 Feet Assistive device: Rolling walker (2 wheels) Gait Pattern/deviations: Step-through pattern, Decreased step length - right, Decreased step length - left, Decreased stride length, Trunk flexed       General Gait Details: pt needs cues for safety as she frequently takes hands off walker  and reaches for furniture. Very slow and effortful gait.  Stairs            Wheelchair Mobility    Modified Rankin (Stroke Patients Only)       Balance Overall balance assessment: Needs assistance Sitting-balance support: Feet supported Sitting balance-Leahy Scale: Good     Standing balance support: Reliant on assistive device for balance, During functional activity, Bilateral upper extremity supported  Standing balance-Leahy Scale: Fair                               Pertinent Vitals/Pain Pain Assessment Pain  Assessment: No/denies pain    Home Living Family/patient expects to be discharged to:: Private residence Living Arrangements: Alone Available Help at Discharge: Family;Available PRN/intermittently Type of Home: Apartment Home Access: Level entry       Home Layout: One level Home Equipment: Rollator (4 wheels);Tub bench Additional Comments: lift chair; 2L of O2    Prior Function Prior Level of Function : Independent/Modified Independent             Mobility Comments: history of falls; uses rollator for mobility. Was about to get started with HHPT after leaving [facility] after previous hospitalization. ADLs Comments: has [place] agency for ADLs.     Hand Dominance        Extremity/Trunk Assessment   Upper Extremity Assessment Upper Extremity Assessment: Generalized weakness    Lower Extremity Assessment Lower Extremity Assessment: Generalized weakness (B LE grossly 4/5)       Communication   Communication: No difficulties  Cognition Arousal/Alertness: Awake/alert Behavior During Therapy: WFL for tasks assessed/performed Overall Cognitive Status: Within Functional Limits for tasks assessed                                 General Comments: very pleasant and talkative        General Comments      Exercises Other Exercises Other Exercises: ambulated to bathroom with cga and needs min assist for transfers on/off low toilet. CGA for hygiene. Other Exercises: educated on IS performance   Assessment/Plan    PT Assessment Patient needs continued PT services  PT Problem List Decreased strength;Decreased activity tolerance;Decreased balance;Decreased mobility;Decreased knowledge of use of DME;Decreased knowledge of precautions;Pain       PT Treatment Interventions DME instruction;Gait training;Functional mobility training;Therapeutic activities;Therapeutic exercise;Balance training;Patient/family education    PT Goals (Current goals can be  found in the Care Plan section)  Acute Rehab PT Goals Patient Stated Goal: to improve safety and decrease falls risk PT Goal Formulation: With patient Time For Goal Achievement: 02/12/23 Potential to Achieve Goals: Good    Frequency Min 2X/week     Co-evaluation               AM-PAC PT "6 Clicks" Mobility  Outcome Measure Help needed turning from your back to your side while in a flat bed without using bedrails?: None Help needed moving from lying on your back to sitting on the side of a flat bed without using bedrails?: None Help needed moving to and from a bed to a chair (including a wheelchair)?: A Little Help needed standing up from a chair using your arms (e.g., wheelchair or bedside chair)?: A Little Help needed to walk in hospital room?: A Little Help needed climbing 3-5 steps with a railing? : A Lot 6 Click Score: 19    End of Session Equipment Utilized During Treatment: Oxygen Activity Tolerance: Patient tolerated treatment well Patient left: in chair;with chair alarm set (with MD and son at bedside) Nurse Communication: Mobility status PT Visit Diagnosis: Muscle weakness (generalized) (M62.81);History of falling (Z91.81);Pain;Difficulty in walking, not elsewhere classified (R26.2)    Time: [id number] PT Time Calculation (min) (ACUTE ONLY): 31 min   Charges:   PT Evaluation $PT Eval Low  Complexity: 1 Low PT Treatments $Therapeutic Activity: 8-22 mins        Greggory Stallion, PT, DPT, GCS [phone]   Zyree Traynham 01/29/2023, 11:46 AM

## 2023-01-29 NOTE — Assessment & Plan Note (Signed)
Currently on methotrexate, follows with rheumatology

## 2023-01-29 NOTE — H&P (Signed)
History and Physical    Patient: Helen Ramsey [id number] DOB: [birth year] DOA: 01/28/2023 DOS: the patient was seen and examined on 01/29/2023 PCP: Virginia Crews, MD  Patient coming from: Home  Chief Complaint:  Chief Complaint  Patient presents with   Weakness    HPI: Helen Ramsey is a 89 y.o. female with medical history significant for HFpEF, chronic hypoxic respiratory failure on 2 L of supplemental oxygen, HFpEF, osteoporosis, CKD stage IIIb, chronic headaches, morphea on methotrexate, Who lives alone and at baseline is ambulant with a walker, hospitalized from 2/18 to 01/09/2023 with fall resulting in subdural hematoma and C3 and T2 fracture who presents to the ED with a 2-day history of weakness described as having a hard time getting up out of bed along with subjective fevers. Patient's aide noticed that she had to help her with most ADL's and called her family to take her to the ED.  Family on their arrival noted increased confusion. Son [name], who gave most of the history  says that his mother had a fall the night prior and was helped up by her son who lives five minutes away.  Patient Has a chronic cough but no worse than baseline.  Denies nausea, vomiting, decreased appetite, diarrhea, abdominal pain or dysuria. Son states that she has had problems with swelling in her legs and had her furosemide changed to torsemide about six weeks ago and recently had metolazone added prn for fluid weight gain. Her blood pressure was noted to be soft but son says that her diastolic usually runs in the 30s to 40s. ED course and data review: Temp 101.1 with pulse in the 60s, BP 113/52, O2 sat in the mid to high 90s on baseline O2 of 2 L.Labs significant for normal WBC of 4500 with lactic acid 2.1>1.3.  Hemoglobin at baseline of 11.4.  Creatinine at baseline at 1.47.  Urinalysis with moderate leukocytes and rare bacteria.  COVID-positive.  EKG, independently interpreted showing sinus rhythm  at 66 with no acute ST-T wave changes.  Chest x-ray shows low lung volumes with streaky bibasilar opacities, atelectasis versus scarring. The ED provider had initially started cefepime, vancomycin for possible pneumonia however when COVID resulted positive she was started on Paxlovid given onset of symptoms just 2 days prior.  Hospitalist consulted for admission.   Review of Systems: As mentioned in the history of present illness. All other systems reviewed and are negative.  Past Medical History:  Diagnosis Date   Abnormal SPEP    Allergic rhinitis, cause unspecified    Allergic rhinitis, cause unspecified    Atherosclerosis of aorta (HCC)    Back pain    lumbar   Degeneration of lumbar or lumbosacral intervertebral disc    Diverticulitis of colon (without mention of hemorrhage)(562.11)    Dizziness and giddiness    Dysthymic disorder    Headache(784.0)    Myalgia and myositis, unspecified    Osteoporosis, unspecified    Other and unspecified hyperlipidemia    Other diseases of nasal cavity and sinuses(478.19)    Other specified disorders of bladder    Syncope    Trigger finger (acquired)    Unspecified essential hypertension    Unspecified sleep apnea    Urinary frequency    Past Surgical History:  Procedure Laterality Date   ABDOMINAL HYSTERECTOMY     ADENOIDECTOMY     APPENDECTOMY     ARTHROSCOPIC KNEE SURGERY     BREAST BIOPSY Right    lymph  node surgery, benign    BREAST EXCISIONAL BIOPSY     CARDIAC CATHETERIZATION     [facility]   CARPAL TUNNEL RELEASE     CATARACT EXTRACTION W/ INTRAOCULAR LENS IMPLANT & ANTERIOR VITRECTOMY, BILATERAL     CHOLECYSTECTOMY     KNEE ARTHROPLASTY Right 01/30/2016   Procedure: COMPUTER ASSISTED TOTAL KNEE ARTHROPLASTY;  Surgeon: Dereck Leep, MD;  Location: [facility] ORS;  Service: Orthopedics;  Laterality: Right;   R/O Lymph Nodes; Right Axilla     ROTATOR CUFF REPAIR Right    SPG block  03/20/2016   sphenopalatine ganglion and maxillary  division of trigeminal nerve block block Dr. Manuella Ghazi   TEMPORAL ARTERY BIOPSY / LIGATION     TONSILLECTOMY     TUBAL LIGATION     Social History:  reports that she has never smoked. She has never used smokeless tobacco. She reports that she does not drink alcohol and does not use drugs.  Allergies  Allergen Reactions   Amoxicillin    Clindamycin/Lincomycin    Doxycycline    Levaquin [Levofloxacin In D5w]    Mirabegron Nausea And Vomiting   Nsaids Other (See Comments)    GI upset   Oxycodone Nausea Only    Other reaction(s): Hallucination   Phenergan [Promethazine Hcl]    Singulair [Montelukast]    Vicodin [Hydrocodone-Acetaminophen]    Zocor [Simvastatin]    Zoloft  [Sertraline] Other (See Comments)   Ampicillin Rash   Bactrim [Sulfamethoxazole-Trimethoprim] Rash   Levofloxacin Other (See Comments)    Weakness   Penicillins Rash    Ampicillin, Clindamycin, Doxycycline Levaquin-severe headache, muscle and joint aches, weak   Tolmetin     Other reaction(s): Other (See Comments) GI upset    Family History  Problem Relation Age of Onset   Heart disease Mother    Depression Mother    Colon polyps Mother    Dementia Mother    Heart disease Father    Dementia Father    Heart disease Brother    Hypertension Brother    Heart disease Brother    Hypertension Brother    Kidney cancer Neg Hx    Bladder Cancer Neg Hx     Prior to Admission medications   Medication Sig Start Date End Date Taking? Authorizing Provider  acetaminophen (TYLENOL) 500 MG tablet Take 1,000 mg by mouth in the morning and at bedtime.   Yes [provider]  aspirin EC 81 MG tablet Take 81 mg by mouth daily.   Yes [provider]  atorvastatin (LIPITOR) 20 MG tablet Take 1 tablet (20 mg total) by mouth daily. 11/20/22  Yes Bacigalupo, [name], MD  Cholecalciferol (VITAMIN D3 PO) Take 1,000 Units by mouth.   Yes [provider]  citalopram (CELEXA) 40 MG tablet Take 1 tablet (40  mg total) by mouth daily. 11/20/22  Yes Bacigalupo, [name], MD  cyanocobalamin 1000 MCG tablet Take 1,000 mcg by mouth daily.   Yes [provider]  Dentifrices (FLUORIDE TOOTHPASTE DT) Place onto teeth daily.    Yes [provider]  EMGALITY, 300 MG DOSE, 100 MG/ML SOSY Inject into the skin. 01/23/23  Yes [provider]  famotidine (PEPCID) 20 MG tablet Take 1 tablet (20 mg total) by mouth daily. 11/20/22  Yes Bacigalupo, [name], MD  ferrous sulfate 325 (65 FE) MG tablet Take 325 mg by mouth daily with supper.   Yes [provider]  fluticasone furoate-vilanterol (BREO ELLIPTA) 100-25 MCG/ACT AEPB Inhale 1 puff  into the lungs daily. 11/20/22  Yes Bacigalupo, [name], MD  folic acid (FOLVITE) 1 MG tablet Take 1 mg by mouth daily. 04/28/22 04/28/23 Yes [provider]  gabapentin (NEURONTIN) 100 MG capsule Take 2 capsules (200 mg total) by mouth 2 (two) times daily AND 3 capsules (300 mg total) at bedtime. 11/20/22  Yes Bacigalupo, [name], MD  hydroxychloroquine (PLAQUENIL) 200 MG tablet Take 200 mg by mouth daily. 01/20/22  Yes [provider]  levothyroxine (SYNTHROID) 50 MCG tablet TAKE 1 TABLET(50 MCG) BY MOUTH DAILY BEFORE BREAKFAST Patient taking differently: Take 50 mcg by mouth daily before breakfast. TAKE 1 TABLET(50 MCG) BY MOUTH DAILY BEFORE BREAKFAST 11/20/22  Yes Bacigalupo, [name], MD  linaclotide [facility]) 145 MCG CAPS capsule Take 1 capsule (145 mcg total) by mouth daily before breakfast. 11/20/22  Yes Bacigalupo, [name], MD  MAGNESIUM GLYCINATE PO Take 400 mg by mouth daily.   Yes [provider]  methotrexate (RHEUMATREX) 2.5 MG tablet Take 10 mg by mouth once a week. 04/28/22  Yes [provider]  Omega-3 Fatty Acids (FISH OIL) 1200 MG CAPS Take 1,200 mg by mouth daily.   Yes [provider]  omeprazole (PRILOSEC) 20 MG capsule TAKE 1 CAPSULE(20 MG) BY MOUTH DAILY 11/20/22  Yes Bacigalupo, [name], MD   phenazopyridine (AZO-TABS) 95 MG tablet Take 1 tablet (95 mg total) by mouth as needed. Patient taking differently: Take 95 mg by mouth 2 (two) times daily. 05/29/16  Yes Mar Daring, PA-C  Polyethylene Glycol 3350 (MIRALAX PO) Take 17 g by mouth daily.   Yes [provider]  potassium gluconate 595 (99 K) MG TABS tablet Take 1 tablet (595 mg total) by mouth 2 (two) times daily. 12/27/18  Yes Mar Daring, PA-C  senna (SENOKOT) 8.6 MG tablet Take 1 tablet (8.6 mg total) by mouth daily. 09/08/22  Yes Bacigalupo, [name], MD  Torsemide 40 MG TABS Take 40 mg by mouth 2 (two) times daily. 12/04/22 12/04/23 Yes [provider]  traMADol (ULTRAM) 50 MG tablet Take 1 tablet (50 mg total) by mouth 3 (three) times daily. 01/09/23  Yes Lorella Nimrod, MD  triamcinolone cream (KENALOG) 0.1 % 2 weeks on and off 2 weeks 12/20/21  Yes [provider]  albuterol (VENTOLIN HFA) 108 (90 Base) MCG/ACT inhaler Inhale 2 puffs into the lungs every 6 (six) hours as needed for wheezing or shortness of breath. 12/29/22 12/29/23  [provider]  ALPRAZolam Duanne Moron) 0.25 MG tablet TAKE 1 TABLET BY MOUTH TWICE DAILY AS NEEDED FOR ANXIETY 11/20/22   Bacigalupo, [name], MD  diphenhydrAMINE (BENADRYL) 25 MG tablet Take 1 tablet (25 mg total) by mouth at bedtime as needed for sleep. 12/11/22   Lorella Nimrod, MD  glycerin adult 2 g suppository Place 1 suppository rectally as needed for constipation. 12/18/22   Virginia Crews, MD  magnesium hydroxide (MILK OF MAGNESIA) 400 MG/5ML suspension Take 5 mLs by mouth daily as needed for mild constipation.    [provider]  melatonin 5 MG TABS Take 5 mg by mouth at bedtime as needed.    [provider]  nystatin (MYCOSTATIN/NYSTOP) powder Apply 1 Application topically 3 (three) times daily. Patient not taking: Reported on 01/28/2023 06/04/22   Tally Joe T, FNP  sodium chloride (OCEAN) 0.65 % nasal spray Place 1 spray into  the nose as needed.    [provider]    Physical Exam: Vitals:   01/28/23 2042  01/28/23 2100 01/28/23 2130 01/28/23 2235  BP:  114/62 129/60 (!) 113/52  Pulse:  65 67 64  Resp:  (!) '24 20 17  '$ Temp:   99.5 F (37.5 C)   TempSrc:   Oral   SpO2:  98% 98% 97%  Weight: 72.1 kg  72.1 kg   Height: '4\' 11"'$  (1.499 m)  '4\' 11"'$  (1.499 m)    Physical Exam Vitals and nursing note reviewed.  Constitutional:      General: She is not in acute distress. HENT:     Head: Normocephalic and atraumatic.  Cardiovascular:     Rate and Rhythm: Normal rate and regular rhythm.     Heart sounds: Normal heart sounds.  Pulmonary:     Effort: Pulmonary effort is normal.     Breath sounds: Normal breath sounds.  Abdominal:     Palpations: Abdomen is soft.     Tenderness: There is no abdominal tenderness.  Neurological:     Mental Status: Mental status is at baseline.     Labs on Admission: I have personally reviewed following labs and imaging studies  CBC: Recent Labs  Lab 01/28/23 1945  WBC 4.5  NEUTROABS 3.1  HGB 11.4*  HCT 34.9*  MCV 99.7  PLT 123456*   Basic Metabolic Panel: Recent Labs  Lab 01/28/23 1945  NA 136  K 3.7  CL 94*  CO2 29  GLUCOSE 115*  BUN 25*  CREATININE 1.45*  CALCIUM 8.7*   GFR: Estimated Creatinine Clearance: 23.6 mL/min (A) (by C-G formula based on SCr of 1.45 mg/dL (H)). Liver Function Tests: Recent Labs  Lab 01/28/23 1945  AST 37  ALT 36  ALKPHOS 100  BILITOT 0.5  PROT 6.8  ALBUMIN 3.5   No results for input(s): "LIPASE", "AMYLASE" in the last 168 hours. No results for input(s): "AMMONIA" in the last 168 hours. Coagulation Profile: No results for input(s): "INR", "PROTIME" in the last 168 hours. Cardiac Enzymes: No results for input(s): "CKTOTAL", "CKMB", "CKMBINDEX", "TROPONINI" in the last 168 hours. BNP (last 3 results) No results for input(s): "PROBNP" in the last 8760 hours. HbA1C: No results for input(s): "HGBA1C" in the last  72 hours. CBG: No results for input(s): "GLUCAP" in the last 168 hours. Lipid Profile: No results for input(s): "CHOL", "HDL", "LDLCALC", "TRIG", "CHOLHDL", "LDLDIRECT" in the last 72 hours. Thyroid Function Tests: No results for input(s): "TSH", "T4TOTAL", "FREET4", "T3FREE", "THYROIDAB" in the last 72 hours. Anemia Panel: No results for input(s): "VITAMINB12", "FOLATE", "FERRITIN", "TIBC", "IRON", "RETICCTPCT" in the last 72 hours. Urine analysis:    Component Value Date/Time   COLORURINE YELLOW (A) 01/28/2023 1945   APPEARANCEUR HAZY (A) 01/28/2023 1945   APPEARANCEUR Hazy (A) 07/15/2016 1548   LABSPEC 1.011 01/28/2023 1945   PHURINE 7.0 01/28/2023 1945   GLUCOSEU NEGATIVE 01/28/2023 1945   HGBUR NEGATIVE 01/28/2023 1945   BILIRUBINUR NEGATIVE 01/28/2023 1945   BILIRUBINUR Negative 06/04/2022 1114   BILIRUBINUR Negative 07/15/2016 1548   [place] 01/28/2023 1945   PROTEINUR 100 (A) 01/28/2023 1945   UROBILINOGEN 0.2 06/04/2022 1114   NITRITE NEGATIVE 01/28/2023 1945   LEUKOCYTESUR MODERATE (A) 01/28/2023 1945    Radiological Exams on Admission: DG Chest Port 1 View  Result Date: 01/28/2023 CLINICAL DATA:  Possible sepsis EXAM: PORTABLE CHEST 1 VIEW COMPARISON:  01/04/2023, 12/10/2022 FINDINGS: Streaky basilar opacities. Low lung volumes. No pleural effusion. Stable cardiomediastinal silhouette with aortic atherosclerosis. IMPRESSION: Low lung volumes with streaky basilar opacities, atelectasis versus scarring. Electronically Signed  By: Donavan Foil M.D.   On: 01/28/2023 19:32     Data Reviewed: Relevant notes from primary care and specialist visits, past discharge summaries as available in EHR, including Care Everywhere. Prior diagnostic testing as pertinent to current admission diagnoses Updated medications and problem lists for reconciliation ED course, including vitals, labs, imaging, treatment and response to treatment Triage notes, nursing and pharmacy  notes and ED provider's notes Notable results as noted in HPI   Assessment and Plan: * COVID-19 virus infection Generalized weakness Symptomatic for generalized weakness and fever starting 2 days prior.   Has a chronic cough at baseline and denies shortness of breath Started on Paxlovid from the ED which we will continue Continue home oxygen.  Currently on baseline requirement Airborne precaution Physical therapy evaluation  History of fall 01/04/23 with subdural hematoma, C3 and T2 2 fracture History of Frequent falls Patient ambulates with walker at baseline PT eval Orthostatic vital signs  Stage 3b chronic kidney disease (HCC) Renal function at baseline  (HFpEF) heart failure with preserved ejection fraction (HCC) Clinically euvolemic Patient is on torsemide 40 mg twice daily and was placed on metolazone prn weight gain Decrease torsemide due to soft blood pressure and high diuretic dose Daily orthostatics in view of frequent falls EF 60 to 65% with G1 DD in January 2024  Chronic respiratory failure with hypoxia (HCC) At baseline O2 requirement Continue supplemental oxygen  Morphea Currently on methotrexate, follows with rheumatology  Acquired hypothyroidism Continue levothyroxine  Essential (primary) hypertension Blood pressure somewhat soft, will decrease torsemide      DVT prophylaxis: Lovenox  Consults: none  Advance Care Planning:   Code Status: Prior   Family Communication: none  Disposition Plan: Back to previous home environment  Severity of Illness: The appropriate patient status for this patient is INPATIENT. Inpatient status is judged to be reasonable and necessary in order to provide the required intensity of service to ensure the patient's safety. The patient's presenting symptoms, physical exam findings, and initial radiographic and laboratory data in the context of their chronic comorbidities is felt to place them at high risk for further  clinical deterioration. Furthermore, it is not anticipated that the patient will be medically stable for discharge from the hospital within 2 midnights of admission.   * I certify that at the point of admission it is my clinical judgment that the patient will require inpatient hospital care spanning beyond 2 midnights from the point of admission due to high intensity of service, high risk for further deterioration and high frequency of surveillance required.*  Author: Athena Masse, MD 01/29/2023 12:17 AM  For on call review [email or web address].

## 2023-01-29 NOTE — Assessment & Plan Note (Addendum)
History of Frequent falls Patient ambulates with walker at baseline PT eval Orthostatic vital signs

## 2023-01-29 NOTE — ED Notes (Signed)
Patient states she feels very hot and has generalized body aches. Vital signs obtained. Tramadol given per request. No acute distress noted at this time.

## 2023-01-29 NOTE — Progress Notes (Addendum)
Interval events noted.  She was working with physical therapist when I walked into the room.  She complains of cough, runny nose and general weakness.   Vital signs are stable.  I observed patient walking with a walker from the bathroom to her chair with the physical therapist.  She was unsteady on her feet.  When she sat down in her chair.  It took her a while to catch her breath.  Continue Paxlovid for COVID-19 infection.  Hold methotrexate for now.  PT recommended rehab at discharge.  Patient will likely benefit from rehab at [name].  Of note, she was recently discharged from the hospital on 01/09/2023 after hospitalization for fall leading to subdural hematoma.  Plan of care was discussed with Mr. Helen Ramsey, son, at the bedside.

## 2023-01-29 NOTE — ED Notes (Signed)
Attempted to give handoff to floor. RN not signed in to send message. Floor called and made aware.

## 2023-01-29 NOTE — Assessment & Plan Note (Deleted)
Generalized weakness Patient has had several falls over the past year

## 2023-01-29 NOTE — ED Notes (Signed)
Patient assisted to stand and pivot onto toilet. Continent of large amount of unmeasured urine. Assisted back to bed. States she is feeling much better. No distress noted.

## 2023-01-29 NOTE — Assessment & Plan Note (Addendum)
Clinically euvolemic Patient is on torsemide 40 mg twice daily and was placed on metolazone prn weight gain Decrease torsemide due to soft blood pressure and high diuretic dose Daily orthostatics in view of frequent falls EF 60 to 65% with G1 DD in January 2024

## 2023-01-30 ENCOUNTER — Inpatient Hospital Stay: Payer: Medicare Other | Admitting: Family Medicine

## 2023-01-30 DIAGNOSIS — U071 COVID-19: Secondary | ICD-10-CM | POA: Diagnosis not present

## 2023-01-30 DIAGNOSIS — D696 Thrombocytopenia, unspecified: Secondary | ICD-10-CM | POA: Diagnosis not present

## 2023-01-30 DIAGNOSIS — R531 Weakness: Secondary | ICD-10-CM | POA: Diagnosis not present

## 2023-01-30 MED ORDER — ALPRAZOLAM 0.25 MG PO TABS
0.2500 mg | ORAL_TABLET | Freq: Two times a day (BID) | ORAL | Status: DC | PRN
  Administered 2023-01-30 – 2023-02-02 (×5): 0.25 mg via ORAL
  Filled 2023-01-30 (×6): qty 1

## 2023-01-30 NOTE — NC FL2 (Signed)
[place] LEVEL OF CARE FORM     IDENTIFICATION  Patient Name: Helen Ramsey Birthdate: [birth year] Sex: female Admission Date (Current Location): 01/28/2023  [facility] and [place] Number:  Engineering geologist and Address:  [facility], [address], [place], [place] [address]      Provider Number: [id number]  Attending Physician Name and Address:  Jennye Boroughs, MD  Relative Name and Phone Number:  Nayalee Rickley [phone]    Current Level of Care: Hospital Recommended Level of Care: [place] Prior Approval Number:    Date Approved/Denied:   PASRR Number: [id number]  Discharge Plan: SNF    Current Diagnoses: Patient Active Problem List   Diagnosis Date Noted   COVID-19 virus infection 01/29/2023   History of fall 01/04/23 with subdural hematoma, C3 and T2 2 fracture 01/29/2023   Frequent falls 01/29/2023   Closed wedge compression fracture of third thoracic vertebra ([place]) 01/09/2023   Decubitus ulcer of coccyx, stage II ([place]) 01/06/2023   Obesity (BMI 30-39.9) 01/06/2023   Subdural hematoma ([place]) 01/04/2023   C3 cervical fracture ([place]) 01/04/2023   Compression fracture of T2 vertebra ([place]) 01/04/2023   Hypokalemia 01/04/2023   Sinus bradycardia 01/04/2023   Drug-induced constipation 12/18/2022   CHF (congestive heart failure) ([place]) 12/10/2022   Chronic respiratory failure with hypoxia ([place]) 12/10/2022   Panniculitis 06/04/2022   MCI (mild cognitive impairment) 06/04/2022   ANA positive 04/28/2022   Morphea 04/28/2022   Stage 3b chronic kidney disease ([place]) 04/15/2022   Pressure injury of left buttock, stage 2 ([place]) 02/14/2022   Rash and nonspecific skin eruption 12/06/2021   Tremor 09/12/2021   Acquired hypothyroidism 05/17/2021   (HFpEF) heart failure with preserved ejection fraction ([place]) 05/16/2021   Generalized weakness 01/26/2021   History of dyspnea 01/26/2021   Recurrent major depressive  disorder, in partial remission ([place]) 02/27/2020   Bilateral myofascial pain 06/01/2019   Bilateral occipital neuralgia 06/01/2019   Age-related osteoporosis without current pathological fracture 08/16/2018   Gait instability 08/16/2018   Dorsalgia 03/31/2016   H/O total knee replacement 02/14/2016   Vitamin D deficiency 11/29/2015   Allergy to other foods 11/29/2015   Knee pain, bilateral 08/29/2015   Sleep apnea 05/30/2015   Mixed hyperlipidemia 05/09/2015   Restless legs syndrome 05/09/2015   Allergic rhinitis 03/28/2015   Anemia 03/28/2015   Arthritis 03/28/2015   Chronic headache 03/28/2015   DDD (degenerative disc disease), lumbar 03/28/2015   Gastro-esophageal reflux disease without esophagitis 03/28/2015   Essential (primary) hypertension 03/28/2015   Adaptive colitis 03/28/2015   OP (osteoporosis) 03/28/2015   Nasal septal perforation 03/28/2015   Spinal stenosis 03/28/2015   Gastric ulcer without hemorrhage or perforation 03/28/2015   Cerebral artery occlusion with cerebral infarction ([place]) 03/28/2015   Connective tissue and disc stenosis of intervertebral foramina of abdomen and other regions 03/28/2015   Diverticulosis of large intestine without perforation or abscess without bleeding 03/28/2015   Osteoarthritis 03/28/2015   Other specified functional intestinal disorders 03/28/2015   Hardening of the aorta (main artery of the heart) ([place]) 07/13/2014   H/O adenomatous polyp of colon 06/27/2005    Orientation RESPIRATION BLADDER Height & Weight     Self, Time, Situation, Place  Normal External catheter Weight: 72.1 kg Height:  4\' 11"  (149.9 cm)  BEHAVIORAL SYMPTOMS/MOOD NEUROLOGICAL BOWEL NUTRITION STATUS   (N/A)  (N/A) Continent Diet (see DC summary)  AMBULATORY STATUS COMMUNICATION OF NEEDS Skin   Limited Assist Verbally Normal (dry)  Personal Care Assistance Level of Assistance  Bathing, Dressing Bathing Assistance: Limited  assistance   Dressing Assistance: Limited assistance     Functional Limitations Info  Sight, Hearing Sight Info: Impaired Hearing Info: Adequate      SPECIAL CARE FACTORS FREQUENCY  PT (By licensed PT), OT (By licensed OT)     PT Frequency: 5 times per week OT Frequency: 5 times per week            Contractures Contractures Info: Not present    Additional Factors Info  Code Status, Allergies Code Status Info: DNR Allergies Info: Amoxicillin, Clindamycin/lincomycin, Doxycycline, Levaquin (Levofloxacin In D5w), Mirabegron, Nsaids, Oxycodone, Phenergan (Promethazine Hcl), Singulair (Montelukast), Vicodin (Hydrocodone-acetaminophen), Zocor (Simvastatin), Zoloft  (Sertraline), Ampicillin, Bactrim (Sulfamethoxazole-trimethoprim), Levofloxacin, Penicillins, Tolmetin           Current Medications (01/30/2023):  This is the current hospital active medication list Current Facility-Administered Medications  Medication Dose Route Frequency Provider Last Rate Last Admin   acetaminophen (TYLENOL) tablet 650 mg  650 mg Oral Q6H PRN Athena Masse, MD   650 mg at 01/30/23 0825   Or   acetaminophen (TYLENOL) suppository 650 mg  650 mg Rectal Q6H PRN Athena Masse, MD       albuterol (VENTOLIN HFA) 108 (90 Base) MCG/ACT inhaler 1-2 puff  1-2 puff Inhalation Q6H PRN Hallaji, Dani Gobble, RPH       aspirin EC tablet 81 mg  81 mg Oral Daily Athena Masse, MD   81 mg at 01/30/23 0825   citalopram (CELEXA) tablet 20 mg  20 mg Oral Daily Jennye Boroughs, MD   20 mg at 01/30/23 0825   enoxaparin (LOVENOX) injection 30 mg  30 mg Subcutaneous Q24H Hallaji, Sheema M, RPH   30 mg at 01/30/23 0825   famotidine (PEPCID) tablet 20 mg  20 mg Oral Daily Judd Gaudier V, MD   20 mg at 01/30/23 0825   fluticasone furoate-vilanterol (BREO ELLIPTA) 100-25 MCG/ACT 1 puff  1 puff Inhalation Daily Athena Masse, MD       hydroxychloroquine (PLAQUENIL) tablet 200 mg  200 mg Oral Daily Judd Gaudier V, MD   200  mg at 01/30/23 0825   levothyroxine (SYNTHROID) tablet 50 mcg  50 mcg Oral Q0600 Athena Masse, MD   50 mcg at 01/30/23 [id number]   linaclotide (LINZESS) capsule 145 mcg  145 mcg Oral QAC breakfast Athena Masse, MD   145 mcg at 01/29/23 0715   magnesium hydroxide (MILK OF MAGNESIA) suspension 5 mL  5 mL Oral Daily PRN Athena Masse, MD       [START ON 02/05/2023] methotrexate (RHEUMATREX) tablet 10 mg  10 mg Oral Weekly Jennye Boroughs, MD       nirmatrelvir/ritonavir (renal dosing) (PAXLOVID) 2 tablet  2 tablet Oral BID Nance Pear, MD   2 tablet at 01/30/23 0826   ondansetron (ZOFRAN) tablet 4 mg  4 mg Oral Q6H PRN Athena Masse, MD       Or   ondansetron [facility]) injection 4 mg  4 mg Intravenous Q6H PRN Athena Masse, MD       pantoprazole (PROTONIX) EC tablet 40 mg  40 mg Oral Daily Judd Gaudier V, MD   40 mg at 01/30/23 0825   phenol (CHLORASEPTIC) mouth spray 1 spray  1 spray Mouth/Throat PRN Jennye Boroughs, MD   1 spray at 01/29/23 1650   polyethylene glycol (MIRALAX / GLYCOLAX) packet 17 g  17 g Oral Daily  Athena Masse, MD   17 g at 01/29/23 0915   sodium chloride (OCEAN) 0.65 % nasal spray 1 spray  1 spray Each Nare PRN Jennye Boroughs, MD       torsemide [facility]) tablet 20 mg  20 mg Oral BID Athena Masse, MD   20 mg at 01/30/23 0825   traMADol (ULTRAM) tablet 50 mg  50 mg Oral Q6H PRN Athena Masse, MD   50 mg at 01/29/23 [id number]     Discharge Medications: Please see discharge summary for a list of discharge medications.  Relevant Imaging Results:  Relevant Lab Results:   Additional Information SS# [id number]  Conception Oms, RN

## 2023-01-30 NOTE — TOC Progression Note (Addendum)
Transition of Care [facility]) - Progression Note    Patient Details  Name: Helen Ramsey MRN: [id number] Date of Birth: 06/23/1933  [facility] CM/SW [place], RN Phone Number: 01/30/2023, 9:47 AM  Clinical Narrative:    Damaris Schooner with [name] the patient's son, he stated that he talked to [facility] They told him that they would take her back for rehab once she is medically stable She will not have to wait for the quarantine time to be completed, they will have a bed for her on Monday    Expected Discharge Plan: Skilled Nursing Facility Barriers to Discharge: SNF Pending bed offer  Expected Discharge Plan and Services   Discharge Planning Services: CM Consult   Living arrangements for the past 2 months: Apartment                                       Social Determinants of Health (SDOH) Interventions SDOH Screenings   Food Insecurity: No Food Insecurity (01/29/2023)  Housing: Low Risk  (01/29/2023)  Transportation Needs: No Transportation Needs (01/29/2023)  Utilities: Not At Risk (01/29/2023)  Alcohol Screen: Low Risk  (12/18/2022)  Depression (PHQ2-9): Medium Risk (12/18/2022)  Financial Resource Strain: Low Risk  (05/06/2022)  Physical Activity: Insufficiently Active (05/06/2022)  Social Connections: Moderately Isolated (05/06/2022)  Stress: No Stress Concern Present (05/06/2022)  Tobacco Use: Low Risk  (01/29/2023)    Readmission Risk Interventions     No data to display

## 2023-01-30 NOTE — Progress Notes (Signed)
PT Cancellation Note  Patient Details Name: Helen Ramsey MRN: [id number] DOB: [birth year]   Cancelled Treatment:    Reason Eval/Treat Not Completed: Fatigue/lethargy limiting ability to participate. Patient sleeping soundly. Unable to rouse effectively to participate in PT at this time. Will re-attempt at later time/date.    Gillian Kluever 01/30/2023, 2:41 PM

## 2023-01-30 NOTE — TOC Progression Note (Signed)
Transition of Care [facility]) - Progression Note    Patient Details  Name: Helen Ramsey MRN: [id number] Date of Birth: 07-03-1933  Transition of Care [facility]) CM/SW [place], RN Phone Number: 01/30/2023, 8:50 AM  Clinical Narrative:     Reached out to [facility] to see if they can accept the patient prior to the 10 day quarentine  Expected Discharge Plan: [place] Barriers to Discharge: SNF Pending bed offer  Expected Discharge Plan and Services   Discharge Planning Services: CM Consult   Living arrangements for the past 2 months: Apartment                                       Social Determinants of Health (SDOH) Interventions SDOH Screenings   Food Insecurity: No Food Insecurity (01/29/2023)  Housing: Low Risk  (01/29/2023)  Transportation Needs: No Transportation Needs (01/29/2023)  Utilities: Not At Risk (01/29/2023)  Alcohol Screen: Low Risk  (12/18/2022)  Depression (PHQ2-9): Medium Risk (12/18/2022)  Financial Resource Strain: Low Risk  (05/06/2022)  Physical Activity: Insufficiently Active (05/06/2022)  Social Connections: Moderately Isolated (05/06/2022)  Stress: No Stress Concern Present (05/06/2022)  Tobacco Use: Low Risk  (01/29/2023)    Readmission Risk Interventions     No data to display

## 2023-01-30 NOTE — Progress Notes (Addendum)
Progress Note    Helen Ramsey  [id number] DOB: 1933-03-12  DOA: 01/28/2023 PCP: Virginia Crews, MD      Brief Narrative:    Medical records reviewed and are as summarized below:  Helen Ramsey is a 89 y.o. female  with medical history significant for HFpEF, chronic hypoxic respiratory failure on 2 L of supplemental oxygen, HFpEF, osteoporosis, CKD stage IIIb, chronic headaches, morphea on methotrexate, Who lives alone and at baseline is ambulant with a walker, hospitalized from 2/18 to 01/09/2023 with fall resulting in subdural hematoma and C3 and T2 fracture.  She presented to the hospital because of generalized weakness, difficulty getting out of bed, confusion and worsening cough.  She was febrile in the emergency department with temperature of 101.1 F and she tested positive for COVID-19 infection.       Assessment/Plan:   Principal Problem:   COVID-19 virus infection Active Problems:   Generalized weakness   History of fall 01/04/23 with subdural hematoma, C3 and T2 2 fracture   Stage 3b chronic kidney disease (HCC)   (HFpEF) heart failure with preserved ejection fraction (HCC)   Chronic respiratory failure with hypoxia (HCC)   Essential (primary) hypertension   Acquired hypothyroidism   Morphea   Frequent falls   Thrombocytopenia (HCC)     Body mass index is 32.1 kg/m.  (Obesity)   COVID-19 infection: Continue Paxlovid  General weakness, frequent falls: PT and OT recommended discharge to SNF.  Thrombocytopenia: This is probably due to acute viral infection.  Monitor CBC.  Chronic diastolic CHF: Compensated.  Continue torsemide.  Chronic hypoxic respiratory failure: Helen Ramsey is on 2 L/min oxygen at baseline  History of subdural hematoma s/p fall on 01/04/2023, history of C3 and T2 fracture, CKD stage IIIb, hypertension, morphea on methotrexate, hypothyroidism     Diet Order             Diet Heart Room service appropriate? Yes; Fluid  consistency: Thin  Diet effective now                            Consultants: None  Procedures: None    Medications:    aspirin EC  81 mg Oral Daily   citalopram  20 mg Oral Daily   enoxaparin (LOVENOX) injection  30 mg Subcutaneous Q24H   famotidine  20 mg Oral Daily   fluticasone furoate-vilanterol  1 puff Inhalation Daily   hydroxychloroquine  200 mg Oral Daily   levothyroxine  50 mcg Oral Q0600   linaclotide  145 mcg Oral QAC breakfast   [START ON 02/05/2023] methotrexate  10 mg Oral Weekly   nirmatrelvir/ritonavir (renal dosing)  2 tablet Oral BID   pantoprazole  40 mg Oral Daily   polyethylene glycol  17 g Oral Daily   torsemide  20 mg Oral BID   Continuous Infusions:   Anti-infectives (From admission, onward)    Start     Dose/Rate Route Frequency Ordered Stop   01/29/23 1000  hydroxychloroquine (PLAQUENIL) tablet 200 mg        200 mg Oral Daily 01/29/23 0058     01/28/23 2330  nirmatrelvir/ritonavir (renal dosing) (PAXLOVID) 2 tablet        2 tablet Oral 2 times daily 01/28/23 2327 02/02/23 2159   01/28/23 2130  vancomycin (VANCOCIN) IVPB 1000 mg/200 mL premix        1,000 mg 200 mL/hr over 60 Minutes Intravenous  Once 01/28/23 2125 01/29/23 0003   01/28/23 2130  ceFEPIme (MAXIPIME) 2 g in sodium chloride 0.9 % 100 mL IVPB        2 g 200 mL/hr over 30 Minutes Intravenous  Once 01/28/23 2125 01/28/23 2236              Family Communication/Anticipated D/C date and plan/Code Status   DVT prophylaxis: enoxaparin (LOVENOX) injection 30 mg Start: 01/30/23 1000     Code Status: DNR  Family Communication: None Disposition Plan: Plan to discharge to SNF.   Status is: Inpatient Remains inpatient appropriate because: Awaiting placement to SNF       Subjective:   Interval events noted.  She complains of nausea, poor appetite, general weakness and cough.  No shortness of breath or chest pain.  Objective:    Vitals:   01/29/23  2300 01/30/23 0007 01/30/23 0320 01/30/23 0744  BP:  (!) 172/65 (!) 150/53 (!) 142/73  Pulse:  67 64 (!) 58  Resp:  18  15  Temp: 98.7 F (37.1 C) 98.3 F (36.8 C)  97.7 F (36.5 C)  TempSrc: Oral     SpO2: 100% 99%  100%  Weight:      Height:       No data found.   Intake/Output Summary (Last 24 hours) at 01/30/2023 1518 Last data filed at 01/30/2023 0300 Gross per 24 hour  Intake 0 ml  Output 800 ml  Net -800 ml   Filed Weights   01/28/23 2042 01/28/23 2130  Weight: 72.1 kg 72.1 kg    Exam:  GEN: NAD SKIN: Warm and dry EYES: EOMI ENT: MMM CV: RRR PULM: CTA B ABD: soft, ND, NT, +BS CNS: AAO x 3, non focal EXT: No edema or tenderness    Pressure Injury 01/04/23 Coccyx Medial Stage 2 -  Partial thickness loss of dermis presenting as a shallow open injury with a red, pink wound bed without slough. 1 cm (Active)  01/04/23 2200  Location: Coccyx  Location Orientation: Medial  Staging: Stage 2 -  Partial thickness loss of dermis presenting as a shallow open injury with a red, pink wound bed without slough.  Wound Description (Comments): 1 cm  Present on Admission:   Dressing Type Foam - Lift dressing to assess site every shift 01/29/23 2000     Data Reviewed:   I have personally reviewed following labs and imaging studies:  Labs: Labs show the following:   Basic Metabolic Panel: Recent Labs  Lab 01/28/23 1945 01/29/23 0430  NA 136 135  K 3.7 3.6  CL 94* 96*  CO2 29 30  GLUCOSE 115* 89  BUN 25* 23  CREATININE 1.45* 1.24*  CALCIUM 8.7* 8.7*   GFR Estimated Creatinine Clearance: 27.7 mL/min (A) (by C-G formula based on SCr of 1.24 mg/dL (H)). Liver Function Tests: Recent Labs  Lab 01/28/23 1945  AST 37  ALT 36  ALKPHOS 100  BILITOT 0.5  PROT 6.8  ALBUMIN 3.5   No results for input(s): "LIPASE", "AMYLASE" in the last 168 hours. No results for input(s): "AMMONIA" in the last 168 hours. Coagulation profile No results for input(s): "INR",  "PROTIME" in the last 168 hours.  CBC: Recent Labs  Lab 01/28/23 1945 01/29/23 0430  WBC 4.5 4.4  NEUTROABS 3.1  --   HGB 11.4* 11.0*  HCT 34.9* 33.8*  MCV 99.7 99.1  PLT 149* 113*   Cardiac Enzymes: No results for input(s): "CKTOTAL", "CKMB", "CKMBINDEX", "TROPONINI" in the last  168 hours. BNP (last 3 results) No results for input(s): "PROBNP" in the last 8760 hours. CBG: No results for input(s): "GLUCAP" in the last 168 hours. D-Dimer: No results for input(s): "DDIMER" in the last 72 hours. Hgb A1c: No results for input(s): "HGBA1C" in the last 72 hours. Lipid Profile: No results for input(s): "CHOL", "HDL", "LDLCALC", "TRIG", "CHOLHDL", "LDLDIRECT" in the last 72 hours. Thyroid function studies: No results for input(s): "TSH", "T4TOTAL", "T3FREE", "THYROIDAB" in the last 72 hours.  Invalid input(s): "FREET3" Anemia work up: No results for input(s): "VITAMINB12", "FOLATE", "FERRITIN", "TIBC", "IRON", "RETICCTPCT" in the last 72 hours. Sepsis Labs: Recent Labs  Lab 01/28/23 1945 01/28/23 2123 01/29/23 0430  WBC 4.5  --  4.4  LATICACIDVEN 2.1* 1.3  --     Microbiology Recent Results (from the past 240 hour(s))  Blood Culture (routine x 2)     Status: None (Preliminary result)   Collection Time: 01/28/23  7:45 PM   Specimen: BLOOD  Result Value Ref Range Status   Specimen Description BLOOD BLOOD LEFT ARM  Final   Special Requests   Final    BOTTLES DRAWN AEROBIC AND ANAEROBIC Blood Culture adequate volume   Culture   Final    NO GROWTH 2 DAYS Performed at [facility], [address] [place], [place] [address]    Report Status PENDING  Incomplete  Resp panel by RT-PCR (RSV, Flu A&B, Covid) Anterior Nasal Swab     Status: Abnormal   Collection Time: 01/28/23  7:45 PM   Specimen: Anterior Nasal Swab  Result Value Ref Range Status   SARS Coronavirus 2 by RT PCR POSITIVE (A) NEGATIVE Final    Comment: (NOTE) SARS-CoV-2 target nucleic acids are  DETECTED.  The SARS-CoV-2 RNA is generally detectable in upper respiratory specimens during the acute phase of infection. Positive results are indicative of the presence of the identified virus, but do not rule out bacterial infection or co-infection with other pathogens not detected by the test. Clinical correlation with patient history and other diagnostic information is necessary to determine patient infection status. The expected result is Negative.  Fact Sheet for Patients: EntrepreneurPulse.com.au  Fact Sheet for Healthcare Providers: IncredibleEmployment.be  This test is not yet approved or cleared by the [place] FDA and  has been authorized for detection and/or diagnosis of SARS-CoV-2 by FDA under an Emergency Use Authorization (EUA).  This EUA will remain in effect (meaning this test can be used) for the duration of  the COVID-19 declaration under Section 564(b)(1) of the A ct, 21 U.S.C. section 360bbb-3(b)(1), unless the authorization is terminated or revoked sooner.     Influenza A by PCR NEGATIVE NEGATIVE Final   Influenza B by PCR NEGATIVE NEGATIVE Final    Comment: (NOTE) The Xpert Xpress SARS-CoV-2/FLU/RSV plus assay is intended as an aid in the diagnosis of influenza from Nasopharyngeal swab specimens and should not be used as a sole basis for treatment. Nasal washings and aspirates are unacceptable for Xpert Xpress SARS-CoV-2/FLU/RSV testing.  Fact Sheet for Patients: EntrepreneurPulse.com.au  Fact Sheet for Healthcare Providers: IncredibleEmployment.be  This test is not yet approved or cleared by the [place] FDA and has been authorized for detection and/or diagnosis of SARS-CoV-2 by FDA under an Emergency Use Authorization (EUA). This EUA will remain in effect (meaning this test can be used) for the duration of the COVID-19 declaration under Section 564(b)(1) of the Act, 21  U.S.C. section 360bbb-3(b)(1), unless the authorization is terminated or revoked.  Resp Syncytial Virus by PCR NEGATIVE NEGATIVE Final    Comment: (NOTE) Fact Sheet for Patients: EntrepreneurPulse.com.au  Fact Sheet for Healthcare Providers: IncredibleEmployment.be  This test is not yet approved or cleared by the [place] FDA and has been authorized for detection and/or diagnosis of SARS-CoV-2 by FDA under an Emergency Use Authorization (EUA). This EUA will remain in effect (meaning this test can be used) for the duration of the COVID-19 declaration under Section 564(b)(1) of the Act, 21 U.S.C. section 360bbb-3(b)(1), unless the authorization is terminated or revoked.  Performed at [facility], [address] [place], [place] [address]   Blood Culture (routine x 2)     Status: None (Preliminary result)   Collection Time: 01/28/23 10:02 PM   Specimen: BLOOD  Result Value Ref Range Status   Specimen Description BLOOD BLOOD RIGHT ARM  Final   Special Requests   Final    BOTTLES DRAWN AEROBIC AND ANAEROBIC Blood Culture results may not be optimal due to an inadequate volume of blood received in culture bottles   Culture   Final    NO GROWTH 2 DAYS Performed at [facility], [address], [place], [place] [address]    Report Status PENDING  Incomplete    Procedures and diagnostic studies:  DG Chest Port 1 View  Result Date: 01/28/2023 CLINICAL DATA:  Possible sepsis EXAM: PORTABLE CHEST 1 VIEW COMPARISON:  01/04/2023, 12/10/2022 FINDINGS: Streaky basilar opacities. Low lung volumes. No pleural effusion. Stable cardiomediastinal silhouette with aortic atherosclerosis. IMPRESSION: Low lung volumes with streaky basilar opacities, atelectasis versus scarring. Electronically Signed   By: Donavan Foil M.D.   On: 01/28/2023 19:32               LOS: 1 day   Maile Linford  [facility]   Pager on  [email or web address]. If 7PM-7AM, please contact night-coverage at [email or web address]     01/30/2023, 3:18 PM

## 2023-01-31 DIAGNOSIS — U071 COVID-19: Secondary | ICD-10-CM | POA: Diagnosis not present

## 2023-01-31 DIAGNOSIS — R531 Weakness: Secondary | ICD-10-CM | POA: Diagnosis not present

## 2023-01-31 MED ORDER — HALOPERIDOL 0.5 MG PO TABS
0.5000 mg | ORAL_TABLET | Freq: Two times a day (BID) | ORAL | Status: DC
  Administered 2023-02-01 – 2023-02-02 (×3): 0.5 mg via ORAL
  Filled 2023-01-31 (×3): qty 1

## 2023-01-31 MED ORDER — HALOPERIDOL 0.5 MG PO TABS
0.5000 mg | ORAL_TABLET | Freq: Once | ORAL | Status: AC
  Administered 2023-01-31: 0.5 mg via ORAL
  Filled 2023-01-31: qty 1

## 2023-01-31 NOTE — Plan of Care (Signed)

## 2023-01-31 NOTE — Progress Notes (Signed)
Progress Note    Helen Ramsey  [id number] DOB: [birth year]  DOA: 01/28/2023 PCP: Virginia Crews, MD      Brief Narrative:    Medical records reviewed and are as summarized below:  Helen Ramsey is a 89 y.o. female  with medical history significant for HFpEF, chronic hypoxic respiratory failure on 2 L of supplemental oxygen, HFpEF, osteoporosis, CKD stage IIIb, chronic headaches, morphea on methotrexate, Who lives alone and at baseline is ambulant with a walker, hospitalized from 2/18 to 01/09/2023 with fall resulting in subdural hematoma and C3 and T2 fracture.  She presented to the hospital because of generalized weakness, difficulty getting out of bed, confusion and worsening cough.  She was febrile in the emergency department with temperature of 101.1 F and she tested positive for COVID-19 infection.       Assessment/Plan:   Principal Problem:   COVID-19 virus infection Active Problems:   Generalized weakness   History of fall 01/04/23 with subdural hematoma, C3 and T2 2 fracture   Stage 3b chronic kidney disease (HCC)   (HFpEF) heart failure with preserved ejection fraction (HCC)   Chronic respiratory failure with hypoxia (HCC)   Essential (primary) hypertension   Acquired hypothyroidism   Morphea   Frequent falls   Thrombocytopenia (HCC)     Body mass index is 32.1 kg/m.  (Obesity)   COVID-19 infection: Continue Paxlovid through 02/02/2023  General weakness, frequent falls: PT and OT recommended discharge to SNF.  Awaiting placement to SNF  Thrombocytopenia: This is probably due to acute viral infection.  Repeat CBC tomorrow  Chronic diastolic CHF: Compensated.  Continue torsemide.  Chronic hypoxic respiratory failure: Continue 2 L/min oxygen via [place]  History of subdural hematoma s/p fall on 01/04/2023, history of C3 and T2 fracture, CKD stage IIIb, hypertension, morphea on methotrexate, hypothyroidism     Diet Order              Diet Heart Room service appropriate? Yes; Fluid consistency: Thin  Diet effective now                            Consultants: None  Procedures: None    Medications:    aspirin EC  81 mg Oral Daily   citalopram  20 mg Oral Daily   enoxaparin (LOVENOX) injection  30 mg Subcutaneous Q24H   famotidine  20 mg Oral Daily   fluticasone furoate-vilanterol  1 puff Inhalation Daily   hydroxychloroquine  200 mg Oral Daily   levothyroxine  50 mcg Oral Q0600   linaclotide  145 mcg Oral QAC breakfast   [START ON 02/05/2023] methotrexate  10 mg Oral Weekly   nirmatrelvir/ritonavir (renal dosing)  2 tablet Oral BID   pantoprazole  40 mg Oral Daily   polyethylene glycol  17 g Oral Daily   torsemide  20 mg Oral BID   Continuous Infusions:   Anti-infectives (From admission, onward)    Start     Dose/Rate Route Frequency Ordered Stop   01/29/23 1000  hydroxychloroquine (PLAQUENIL) tablet 200 mg        200 mg Oral Daily 01/29/23 0058     01/28/23 2330  nirmatrelvir/ritonavir (renal dosing) (PAXLOVID) 2 tablet        2 tablet Oral 2 times daily 01/28/23 2327 02/02/23 2159   01/28/23 2130  vancomycin (VANCOCIN) IVPB 1000 mg/200 mL premix        1,000 mg 200  mL/hr over 60 Minutes Intravenous  Once 01/28/23 2125 01/29/23 0003   01/28/23 2130  ceFEPIme (MAXIPIME) 2 g in sodium chloride 0.9 % 100 mL IVPB        2 g 200 mL/hr over 30 Minutes Intravenous  Once 01/28/23 2125 01/28/23 2236              Family Communication/Anticipated D/C date and plan/Code Status   DVT prophylaxis: enoxaparin (LOVENOX) injection 30 mg Start: 01/30/23 1000     Code Status: DNR  Family Communication: Plan discussed with [name], his son, over the phone Disposition Plan: Plan to discharge to SNF.   Status is: Inpatient Remains inpatient appropriate because: Awaiting placement to SNF       Subjective:   Interval events noted.  She said she felt sick last night so she did not eat or  supper.  Because of that, she's starving this morning.  She feels she has appetite this morning and she's hopeful she will eat her breakfast.  No shortness of breath or chest pain.  She is feeling a little better today.  Objective:    Vitals:   01/30/23 0320 01/30/23 0744 01/31/23 0023 01/31/23 0807  BP: (!) 150/53 (!) 142/73 (!) 113/94 (!) 158/55  Pulse: 64 (!) 58 82 66  Resp:  15 18 17   Temp:  97.7 F (36.5 C) (!) 97.5 F (36.4 C) 98.1 F (36.7 C)  TempSrc:      SpO2:  100% 100% 100%  Weight:      Height:       No data found.   Intake/Output Summary (Last 24 hours) at 01/31/2023 1015 Last data filed at 01/30/2023 2300 Gross per 24 hour  Intake 150 ml  Output --  Net 150 ml   Filed Weights   01/28/23 2042 01/28/23 2130  Weight: 72.1 kg 72.1 kg    Exam:  GEN: NAD SKIN: Warm and dry EYES: No pallor or icterus ENT: MMM CV: RRR PULM: No wheezing or rales ABD: soft, ND, NT, +BS CNS: AAO x 3, non focal EXT: No edema or tenderness     Data Reviewed:   I have personally reviewed following labs and imaging studies:  Labs: Labs show the following:   Basic Metabolic Panel: Recent Labs  Lab 01/28/23 1945 01/29/23 0430  NA 136 135  K 3.7 3.6  CL 94* 96*  CO2 29 30  GLUCOSE 115* 89  BUN 25* 23  CREATININE 1.45* 1.24*  CALCIUM 8.7* 8.7*   GFR Estimated Creatinine Clearance: 27.7 mL/min (A) (by C-G formula based on SCr of 1.24 mg/dL (H)). Liver Function Tests: Recent Labs  Lab 01/28/23 1945  AST 37  ALT 36  ALKPHOS 100  BILITOT 0.5  PROT 6.8  ALBUMIN 3.5   No results for input(s): "LIPASE", "AMYLASE" in the last 168 hours. No results for input(s): "AMMONIA" in the last 168 hours. Coagulation profile No results for input(s): "INR", "PROTIME" in the last 168 hours.  CBC: Recent Labs  Lab 01/28/23 1945 01/29/23 0430  WBC 4.5 4.4  NEUTROABS 3.1  --   HGB 11.4* 11.0*  HCT 34.9* 33.8*  MCV 99.7 99.1  PLT 149* 113*   Cardiac Enzymes: No  results for input(s): "CKTOTAL", "CKMB", "CKMBINDEX", "TROPONINI" in the last 168 hours. BNP (last 3 results) No results for input(s): "PROBNP" in the last 8760 hours. CBG: No results for input(s): "GLUCAP" in the last 168 hours. D-Dimer: No results for input(s): "DDIMER" in the last 72 hours.  Hgb A1c: No results for input(s): "HGBA1C" in the last 72 hours. Lipid Profile: No results for input(s): "CHOL", "HDL", "LDLCALC", "TRIG", "CHOLHDL", "LDLDIRECT" in the last 72 hours. Thyroid function studies: No results for input(s): "TSH", "T4TOTAL", "T3FREE", "THYROIDAB" in the last 72 hours.  Invalid input(s): "FREET3" Anemia work up: No results for input(s): "VITAMINB12", "FOLATE", "FERRITIN", "TIBC", "IRON", "RETICCTPCT" in the last 72 hours. Sepsis Labs: Recent Labs  Lab 01/28/23 1945 01/28/23 2123 01/29/23 0430  WBC 4.5  --  4.4  LATICACIDVEN 2.1* 1.3  --     Microbiology Recent Results (from the past 240 hour(s))  Blood Culture (routine x 2)     Status: None (Preliminary result)   Collection Time: 01/28/23  7:45 PM   Specimen: BLOOD  Result Value Ref Range Status   Specimen Description BLOOD BLOOD LEFT ARM  Final   Special Requests   Final    BOTTLES DRAWN AEROBIC AND ANAEROBIC Blood Culture adequate volume   Culture   Final    NO GROWTH 3 DAYS Performed at [facility], [address], [place], [place] [address]    Report Status PENDING  Incomplete  Resp panel by RT-PCR (RSV, Flu A&B, Covid) Anterior Nasal Swab     Status: Abnormal   Collection Time: 01/28/23  7:45 PM   Specimen: Anterior Nasal Swab  Result Value Ref Range Status   SARS Coronavirus 2 by RT PCR POSITIVE (A) NEGATIVE Final    Comment: (NOTE) SARS-CoV-2 target nucleic acids are DETECTED.  The SARS-CoV-2 RNA is generally detectable in upper respiratory specimens during the acute phase of infection. Positive results are indicative of the presence of the identified virus, but do not rule out  bacterial infection or co-infection with other pathogens not detected by the test. Clinical correlation with patient history and other diagnostic information is necessary to determine patient infection status. The expected result is Negative.  Fact Sheet for Patients: EntrepreneurPulse.com.au  Fact Sheet for Healthcare Providers: IncredibleEmployment.be  This test is not yet approved or cleared by the [place] FDA and  has been authorized for detection and/or diagnosis of SARS-CoV-2 by FDA under an Emergency Use Authorization (EUA).  This EUA will remain in effect (meaning this test can be used) for the duration of  the COVID-19 declaration under Section 564(b)(1) of the A ct, 21 U.S.C. section 360bbb-3(b)(1), unless the authorization is terminated or revoked sooner.     Influenza A by PCR NEGATIVE NEGATIVE Final   Influenza B by PCR NEGATIVE NEGATIVE Final    Comment: (NOTE) The Xpert Xpress SARS-CoV-2/FLU/RSV plus assay is intended as an aid in the diagnosis of influenza from Nasopharyngeal swab specimens and should not be used as a sole basis for treatment. Nasal washings and aspirates are unacceptable for Xpert Xpress SARS-CoV-2/FLU/RSV testing.  Fact Sheet for Patients: EntrepreneurPulse.com.au  Fact Sheet for Healthcare Providers: IncredibleEmployment.be  This test is not yet approved or cleared by the [place] FDA and has been authorized for detection and/or diagnosis of SARS-CoV-2 by FDA under an Emergency Use Authorization (EUA). This EUA will remain in effect (meaning this test can be used) for the duration of the COVID-19 declaration under Section 564(b)(1) of the Act, 21 U.S.C. section 360bbb-3(b)(1), unless the authorization is terminated or revoked.     Resp Syncytial Virus by PCR NEGATIVE NEGATIVE Final    Comment: (NOTE) Fact Sheet for  Patients: EntrepreneurPulse.com.au  Fact Sheet for Healthcare Providers: IncredibleEmployment.be  This test is not yet approved or cleared by  the [employer] and has been authorized for detection and/or diagnosis of SARS-CoV-2 by FDA under an Emergency Use Authorization (EUA). This EUA will remain in effect (meaning this test can be used) for the duration of the COVID-19 declaration under Section 564(b)(1) of the Act, 21 U.S.C. section 360bbb-3(b)(1), unless the authorization is terminated or revoked.  Performed at [facility], [address] [place], [place] [address]   Blood Culture (routine x 2)     Status: None (Preliminary result)   Collection Time: 01/28/23 10:02 PM   Specimen: BLOOD  Result Value Ref Range Status   Specimen Description BLOOD BLOOD RIGHT ARM  Final   Special Requests   Final    BOTTLES DRAWN AEROBIC AND ANAEROBIC Blood Culture results may not be optimal due to an inadequate volume of blood received in culture bottles   Culture   Final    NO GROWTH 3 DAYS Performed at [facility], [address] [place], [place] [address]    Report Status PENDING  Incomplete    Procedures and diagnostic studies:  No results found.             LOS: 2 days   Railynn Ballo  [facility]   Pager on [email or web address]. If 7PM-7AM, please contact night-coverage at [email or web address]     01/31/2023, 10:15 AM

## 2023-02-01 DIAGNOSIS — R531 Weakness: Secondary | ICD-10-CM | POA: Diagnosis not present

## 2023-02-01 DIAGNOSIS — U071 COVID-19: Secondary | ICD-10-CM | POA: Diagnosis not present

## 2023-02-01 LAB — CBC WITH DIFFERENTIAL/PLATELET
Abs Immature Granulocytes: 0.02 10*3/uL (ref 0.00–0.07)
Basophils Absolute: 0 10*3/uL (ref 0.0–0.1)
Basophils Relative: 0 %
Eosinophils Absolute: 0.1 10*3/uL (ref 0.0–0.5)
Eosinophils Relative: 2 %
HCT: 35.7 % — ABNORMAL LOW (ref 36.0–46.0)
Hemoglobin: 12 g/dL (ref 12.0–15.0)
Immature Granulocytes: 1 %
Lymphocytes Relative: 36 %
Lymphs Abs: 1.5 10*3/uL (ref 0.7–4.0)
MCH: 32.2 pg (ref 26.0–34.0)
MCHC: 33.6 g/dL (ref 30.0–36.0)
MCV: 95.7 fL (ref 80.0–100.0)
Monocytes Absolute: 0.6 10*3/uL (ref 0.1–1.0)
Monocytes Relative: 14 %
Neutro Abs: 2 10*3/uL (ref 1.7–7.7)
Neutrophils Relative %: 47 %
Platelets: 207 10*3/uL (ref 150–400)
RBC: 3.73 MIL/uL — ABNORMAL LOW (ref 3.87–5.11)
RDW: 13.2 % (ref 11.5–15.5)
WBC: 4.2 10*3/uL (ref 4.0–10.5)
nRBC: 0 % (ref 0.0–0.2)

## 2023-02-01 MED ORDER — BUSPIRONE HCL 10 MG PO TABS
5.0000 mg | ORAL_TABLET | Freq: Two times a day (BID) | ORAL | Status: DC
  Administered 2023-02-01 – 2023-02-03 (×4): 5 mg via ORAL
  Filled 2023-02-01 (×4): qty 1

## 2023-02-01 NOTE — Plan of Care (Signed)
  Problem: Coping: Goal: Psychosocial and spiritual needs will be supported Outcome: Progressing   

## 2023-02-01 NOTE — Progress Notes (Signed)
Progress Note    Helen Ramsey  [id number] DOB: [birth year]  DOA: 01/28/2023 PCP: Virginia Crews, MD      Brief Narrative:    Medical records reviewed and are as summarized below:  Helen Ramsey is a 89 y.o. female  with medical history significant for HFpEF, chronic hypoxic respiratory failure on 2 L of supplemental oxygen, HFpEF, osteoporosis, CKD stage IIIb, chronic headaches, morphea on methotrexate, Who lives alone and at baseline is ambulant with a walker, hospitalized from 2/18 to 01/09/2023 with fall resulting in subdural hematoma and C3 and T2 fracture.  She presented to the hospital because of generalized weakness, difficulty getting out of bed, confusion and worsening cough.  She was febrile in the emergency department with temperature of 101.1 F and she tested positive for COVID-19 infection.       Assessment/Plan:   Principal Problem:   COVID-19 virus infection Active Problems:   Generalized weakness   History of fall 01/04/23 with subdural hematoma, C3 and T2 2 fracture   Stage 3b chronic kidney disease (HCC)   (HFpEF) heart failure with preserved ejection fraction (HCC)   Chronic respiratory failure with hypoxia (HCC)   Essential (primary) hypertension   Acquired hypothyroidism   Morphea   Frequent falls   Thrombocytopenia (HCC)     Body mass index is 32.1 kg/m.  (Obesity)   COVID-19 infection: Continue Paxlovid  General weakness, frequent falls: PT and OT recommended discharge to SNF.  Awaiting placement to SNF  Thrombocytopenia: Resolved  Chronic diastolic CHF: Compensated.  Continue torsemide.  Chronic hypoxic respiratory failure: He is on 2 L/min oxygen at baseline  Anxiety: Start buspirone.  Continue Xanax as needed.  Continue Haldol for recent hallucinations, probably from delirium.    History of subdural hematoma s/p fall on 01/04/2023, history of C3 and T2 fracture, CKD stage IIIb, hypertension, morphea on  methotrexate, hypothyroidism     Diet Order             Diet Heart Room service appropriate? Yes; Fluid consistency: Thin  Diet effective now                            Consultants: None  Procedures: None    Medications:    aspirin EC  81 mg Oral Daily   citalopram  20 mg Oral Daily   enoxaparin (LOVENOX) injection  30 mg Subcutaneous Q24H   famotidine  20 mg Oral Daily   fluticasone furoate-vilanterol  1 puff Inhalation Daily   haloperidol  0.5 mg Oral BID   hydroxychloroquine  200 mg Oral Daily   levothyroxine  50 mcg Oral Q0600   linaclotide  145 mcg Oral QAC breakfast   [START ON 02/05/2023] methotrexate  10 mg Oral Weekly   nirmatrelvir/ritonavir (renal dosing)  2 tablet Oral BID   pantoprazole  40 mg Oral Daily   polyethylene glycol  17 g Oral Daily   torsemide  20 mg Oral BID   Continuous Infusions:   Anti-infectives (From admission, onward)    Start     Dose/Rate Route Frequency Ordered Stop   01/29/23 1000  hydroxychloroquine (PLAQUENIL) tablet 200 mg        200 mg Oral Daily 01/29/23 0058     01/28/23 2330  nirmatrelvir/ritonavir (renal dosing) (PAXLOVID) 2 tablet        2 tablet Oral 2 times daily 01/28/23 2327 02/02/23 2159   01/28/23 2130  vancomycin (VANCOCIN) IVPB 1000 mg/200 mL premix        1,000 mg 200 mL/hr over 60 Minutes Intravenous  Once 01/28/23 2125 01/29/23 0003   01/28/23 2130  ceFEPIme (MAXIPIME) 2 g in sodium chloride 0.9 % 100 mL IVPB        2 g 200 mL/hr over 30 Minutes Intravenous  Once 01/28/23 2125 01/28/23 2236              Family Communication/Anticipated D/C date and plan/Code Status   DVT prophylaxis: enoxaparin (LOVENOX) injection 30 mg Start: 01/30/23 1000     Code Status: DNR  Family Communication: None Disposition Plan: Plan to discharge to SNF.   Status is: Inpatient Remains inpatient appropriate because: Awaiting placement to SNF       Subjective:   Interval events noted.  She  complains of anxiety.  No shortness of breath or chest pain.  Objective:    Vitals:   01/31/23 1444 01/31/23 1857 01/31/23 2345 02/01/23 0914  BP: (!) 84/70 (!) 152/50 137/62 (!) 155/53  Pulse: 73 61 61 63  Resp: 17  18 18   Temp: 98.1 F (36.7 C)  98 F (36.7 C) 97.8 F (36.6 C)  TempSrc:      SpO2: 99% 100% 100% 99%  Weight:      Height:       No data found.   Intake/Output Summary (Last 24 hours) at 02/01/2023 1532 Last data filed at 02/01/2023 0904 Gross per 24 hour  Intake 250 ml  Output --  Net 250 ml   Filed Weights   01/28/23 2042 01/28/23 2130  Weight: 72.1 kg 72.1 kg    Exam:  GEN: NAD SKIN: Warm and dry EYES: No pallor or icterus ENT: MMM CV: RRR PULM: CTA B ABD: soft, ND, NT, +BS CNS: AAO x 3, non focal EXT: No edema or tenderness      Data Reviewed:   I have personally reviewed following labs and imaging studies:  Labs: Labs show the following:   Basic Metabolic Panel: Recent Labs  Lab 01/28/23 1945 01/29/23 0430  NA 136 135  K 3.7 3.6  CL 94* 96*  CO2 29 30  GLUCOSE 115* 89  BUN 25* 23  CREATININE 1.45* 1.24*  CALCIUM 8.7* 8.7*   GFR Estimated Creatinine Clearance: 27.7 mL/min (A) (by C-G formula based on SCr of 1.24 mg/dL (H)). Liver Function Tests: Recent Labs  Lab 01/28/23 1945  AST 37  ALT 36  ALKPHOS 100  BILITOT 0.5  PROT 6.8  ALBUMIN 3.5   No results for input(s): "LIPASE", "AMYLASE" in the last 168 hours. No results for input(s): "AMMONIA" in the last 168 hours. Coagulation profile No results for input(s): "INR", "PROTIME" in the last 168 hours.  CBC: Recent Labs  Lab 01/28/23 1945 01/29/23 0430 02/01/23 0334  WBC 4.5 4.4 4.2  NEUTROABS 3.1  --  2.0  HGB 11.4* 11.0* 12.0  HCT 34.9* 33.8* 35.7*  MCV 99.7 99.1 95.7  PLT 149* 113* 207   Cardiac Enzymes: No results for input(s): "CKTOTAL", "CKMB", "CKMBINDEX", "TROPONINI" in the last 168 hours. BNP (last 3 results) No results for input(s): "PROBNP"  in the last 8760 hours. CBG: No results for input(s): "GLUCAP" in the last 168 hours. D-Dimer: No results for input(s): "DDIMER" in the last 72 hours. Hgb A1c: No results for input(s): "HGBA1C" in the last 72 hours. Lipid Profile: No results for input(s): "CHOL", "HDL", "LDLCALC", "TRIG", "CHOLHDL", "LDLDIRECT" in the last 72 hours.  Thyroid function studies: No results for input(s): "TSH", "T4TOTAL", "T3FREE", "THYROIDAB" in the last 72 hours.  Invalid input(s): "FREET3" Anemia work up: No results for input(s): "VITAMINB12", "FOLATE", "FERRITIN", "TIBC", "IRON", "RETICCTPCT" in the last 72 hours. Sepsis Labs: Recent Labs  Lab 01/28/23 1945 01/28/23 2123 01/29/23 0430 02/01/23 0334  WBC 4.5  --  4.4 4.2  LATICACIDVEN 2.1* 1.3  --   --     Microbiology Recent Results (from the past 240 hour(s))  Blood Culture (routine x 2)     Status: None (Preliminary result)   Collection Time: 01/28/23  7:45 PM   Specimen: BLOOD  Result Value Ref Range Status   Specimen Description BLOOD BLOOD LEFT ARM  Final   Special Requests   Final    BOTTLES DRAWN AEROBIC AND ANAEROBIC Blood Culture adequate volume   Culture   Final    NO GROWTH 4 DAYS Performed at [facility], [address] [place], [place] [address]    Report Status PENDING  Incomplete  Resp panel by RT-PCR (RSV, Flu A&B, Covid) Anterior Nasal Swab     Status: Abnormal   Collection Time: 01/28/23  7:45 PM   Specimen: Anterior Nasal Swab  Result Value Ref Range Status   SARS Coronavirus 2 by RT PCR POSITIVE (A) NEGATIVE Final    Comment: (NOTE) SARS-CoV-2 target nucleic acids are DETECTED.  The SARS-CoV-2 RNA is generally detectable in upper respiratory specimens during the acute phase of infection. Positive results are indicative of the presence of the identified virus, but do not rule out bacterial infection or co-infection with other pathogens not detected by the test. Clinical correlation with patient history  and other diagnostic information is necessary to determine patient infection status. The expected result is Negative.  Fact Sheet for Patients: EntrepreneurPulse.com.au  Fact Sheet for Healthcare Providers: IncredibleEmployment.be  This test is not yet approved or cleared by the [place] FDA and  has been authorized for detection and/or diagnosis of SARS-CoV-2 by FDA under an Emergency Use Authorization (EUA).  This EUA will remain in effect (meaning this test can be used) for the duration of  the COVID-19 declaration under Section 564(b)(1) of the A ct, 21 U.S.C. section 360bbb-3(b)(1), unless the authorization is terminated or revoked sooner.     Influenza A by PCR NEGATIVE NEGATIVE Final   Influenza B by PCR NEGATIVE NEGATIVE Final    Comment: (NOTE) The Xpert Xpress SARS-CoV-2/FLU/RSV plus assay is intended as an aid in the diagnosis of influenza from Nasopharyngeal swab specimens and should not be used as a sole basis for treatment. Nasal washings and aspirates are unacceptable for Xpert Xpress SARS-CoV-2/FLU/RSV testing.  Fact Sheet for Patients: EntrepreneurPulse.com.au  Fact Sheet for Healthcare Providers: IncredibleEmployment.be  This test is not yet approved or cleared by the [place] FDA and has been authorized for detection and/or diagnosis of SARS-CoV-2 by FDA under an Emergency Use Authorization (EUA). This EUA will remain in effect (meaning this test can be used) for the duration of the COVID-19 declaration under Section 564(b)(1) of the Act, 21 U.S.C. section 360bbb-3(b)(1), unless the authorization is terminated or revoked.     Resp Syncytial Virus by PCR NEGATIVE NEGATIVE Final    Comment: (NOTE) Fact Sheet for Patients: EntrepreneurPulse.com.au  Fact Sheet for Healthcare Providers: IncredibleEmployment.be  This test is not yet  approved or cleared by the [place] FDA and has been authorized for detection and/or diagnosis of SARS-CoV-2 by FDA under an Emergency Use Authorization (EUA). This  EUA will remain in effect (meaning this test can be used) for the duration of the COVID-19 declaration under Section 564(b)(1) of the Act, 21 U.S.C. section 360bbb-3(b)(1), unless the authorization is terminated or revoked.  Performed at [facility], [address] [place], [place] [address]   Blood Culture (routine x 2)     Status: None (Preliminary result)   Collection Time: 01/28/23 10:02 PM   Specimen: BLOOD  Result Value Ref Range Status   Specimen Description BLOOD BLOOD RIGHT ARM  Final   Special Requests   Final    BOTTLES DRAWN AEROBIC AND ANAEROBIC Blood Culture results may not be optimal due to an inadequate volume of blood received in culture bottles   Culture   Final    NO GROWTH 4 DAYS Performed at [facility], [address], [place], [place] [address]    Report Status PENDING  Incomplete    Procedures and diagnostic studies:  No results found.             LOS: 3 days   Eyob Godlewski  [facility]   Pager on [email or web address]. If 7PM-7AM, please contact night-coverage at [email or web address]     02/01/2023, 3:32 PM

## 2023-02-02 ENCOUNTER — Other Ambulatory Visit (HOSPITAL_COMMUNITY): Payer: Self-pay

## 2023-02-02 ENCOUNTER — Inpatient Hospital Stay: Payer: Medicare Other

## 2023-02-02 DIAGNOSIS — I82431 Acute embolism and thrombosis of right popliteal vein: Secondary | ICD-10-CM | POA: Diagnosis not present

## 2023-02-02 DIAGNOSIS — R531 Weakness: Secondary | ICD-10-CM | POA: Diagnosis not present

## 2023-02-02 DIAGNOSIS — U071 COVID-19: Secondary | ICD-10-CM | POA: Diagnosis not present

## 2023-02-02 LAB — CULTURE, BLOOD (ROUTINE X 2)
Culture: NO GROWTH
Culture: NO GROWTH
Special Requests: ADEQUATE

## 2023-02-02 MED ORDER — ENOXAPARIN SODIUM 80 MG/0.8ML IJ SOSY
1.0000 mg/kg | PREFILLED_SYRINGE | Freq: Two times a day (BID) | INTRAMUSCULAR | Status: DC
  Administered 2023-02-02 – 2023-02-03 (×2): 72.5 mg via SUBCUTANEOUS
  Filled 2023-02-02 (×2): qty 0.72

## 2023-02-02 NOTE — TOC Progression Note (Addendum)
Transition of Care [facility]) - Progression Note    Patient Details  Name: Helen Ramsey MRN: [id number] Date of Birth: 20-Apr-1933  [facility]) CM/SW [place], RN Phone Number: 02/02/2023, 7:50 AM  Clinical Narrative:    I reached out to [place] at [facility] to inquire if they have a bed available today Awaiting a response   Update, [facility] can take today, she will have a Duplex for Vascular first   Expected Discharge Plan: [place] Barriers to Discharge: SNF Pending bed offer  Expected Discharge Plan and Services   Discharge Planning Services: CM Consult   Living arrangements for the past 2 months: Apartment                                       Social Determinants of Health (SDOH) Interventions SDOH Screenings   Food Insecurity: No Food Insecurity (01/29/2023)  Housing: Low Risk  (01/29/2023)  Transportation Needs: No Transportation Needs (01/29/2023)  Utilities: Not At Risk (01/29/2023)  Alcohol Screen: Low Risk  (12/18/2022)  Depression (PHQ2-9): Medium Risk (12/18/2022)  Financial Resource Strain: Low Risk  (05/06/2022)  Physical Activity: Insufficiently Active (05/06/2022)  Social Connections: Moderately Isolated (05/06/2022)  Stress: No Stress Concern Present (05/06/2022)  Tobacco Use: Low Risk  (01/29/2023)    Readmission Risk Interventions     No data to display

## 2023-02-02 NOTE — Plan of Care (Signed)

## 2023-02-02 NOTE — Progress Notes (Addendum)
ANTICOAGULATION CONSULT NOTE - Initial Consult  Pharmacy Consult for enoxaparin Indication: Acute DVT right popliteal vein   Allergies  Allergen Reactions   Amoxicillin    Clindamycin/Lincomycin    Doxycycline    Levaquin [Levofloxacin In D5w]    Mirabegron Nausea And Vomiting   Nsaids Other (See Comments)    GI upset   Oxycodone Nausea Only    Other reaction(s): Hallucination   Phenergan [Promethazine Hcl]    Singulair [Montelukast]    Vicodin [Hydrocodone-Acetaminophen]    Zocor [Simvastatin]    Zoloft  [Sertraline] Other (See Comments)   Ampicillin Rash   Bactrim [Sulfamethoxazole-Trimethoprim] Rash   Levofloxacin Other (See Comments)    Weakness   Penicillins Rash    Ampicillin, Clindamycin, Doxycycline Levaquin-severe headache, muscle and joint aches, weak   Tolmetin     Other reaction(s): Other (See Comments) GI upset    Patient Measurements: Height: 4\' 11"  (149.9 cm) Weight: 72.1 kg (158 lb 15.2 oz) IBW/kg (Calculated) : 43.2  Vital Signs: Temp: 98.7 F (37.1 C) (03/18 0829) Temp Source: Oral (03/18 0829) BP: 159/65 (03/18 0829) Pulse Rate: 66 (03/18 0829)  Labs: Recent Labs    02/01/23 0334  HGB 12.0  HCT 35.7*  PLT 207    Estimated Creatinine Clearance: 27.7 mL/min (A) (by C-G formula based on SCr of 1.24 mg/dL (H)).   Medical History: Past Medical History:  Diagnosis Date   Abnormal SPEP    Allergic rhinitis, cause unspecified    Allergic rhinitis, cause unspecified    Atherosclerosis of aorta (HCC)    Back pain    lumbar   Degeneration of lumbar or lumbosacral intervertebral disc    Diverticulitis of colon (without mention of hemorrhage)(562.11)    Dizziness and giddiness    Dysthymic disorder    Headache(784.0)    Myalgia and myositis, unspecified    Osteoporosis, unspecified    Other and unspecified hyperlipidemia    Other diseases of nasal cavity and sinuses(478.19)    Other specified disorders of bladder    Syncope    Trigger  finger (acquired)    Unspecified essential hypertension    Unspecified sleep apnea    Urinary frequency     Medications:  No prior to admission anticoagulation per fill history  Assessment: 89 year old female admitted with weakness and cough 2/2 COVID-19 on Paxlovid through 3/18. PMH includes HFpEF, anxiety, chronic respiratory failure, hypothyroidism, hypertension, and CKD3B. Recently admitted 01/04/2023 for subdural hematoma and C3 fracture noted to be stable on repeat CT in February 2024.  Venous doppler: positive for right popliteal nonocclusive DVT extending into the right posterior tibial veins.  Baseline H&H acceptable. Hgb 12.0 and Plts 207 on 3/17  Goal of Therapy:  Monitor platelets by anticoagulation protocol: Yes   Plan:  Start Lovenox 1mg /kg Q12Hr.  Glean Salvo, PharmD, BCPS Clinical Pharmacist  02/02/2023 12:42 PM

## 2023-02-02 NOTE — TOC Benefit Eligibility Note (Signed)
Patient Teacher, English as a foreign language completed.    The patient is currently admitted and upon discharge could be taking Eliquis Starter Pack.  The current 30 day co-pay is $11.20.   The patient is insured through [place] [place] Patient Advocate Specialist [place] Patient Advocate Team Direct Number: [phone]  Fax: [phone]

## 2023-02-02 NOTE — Progress Notes (Signed)
Physical Therapy Treatment Patient Details Name: Helen Ramsey MRN: [id number] DOB: August 06, 1933 Today's Date: 02/02/2023   History of Present Illness Pt admitted for covid. Recent hospital stay recently from 2/18- 2/23 with spinal fx at C3 and T2. Per chart, was prescribed soft collar for comfort only. History includes HF, CRF on 2L, and CKD.    PT Comments    Pt is making good progress towards goals with ability to ambulate 2 laps in room prior to fatigue. Still needs cues for sequencing and safety during turns. Mobility performed on 2L of O2 with sats WNL. Improved independence noted with transfers including bathroom activities. Still remains at falls risk. Will continue to progress.   Recommendations for follow up therapy are one component of a multi-disciplinary discharge planning process, led by the attending physician.  Recommendations may be updated based on patient status, additional functional criteria and insurance authorization.  Follow Up Recommendations  Skilled nursing-short term rehab (<3 hours/day) Can patient physically be transported by private vehicle: Yes   Assistance Recommended at Discharge Set up Supervision/Assistance  Patient can return home with the following A little help with walking and/or transfers;A little help with bathing/dressing/bathroom;Direct supervision/assist for medications management;Assist for transportation;Help with stairs or ramp for entrance   Equipment Recommendations  None recommended by PT    Recommendations for Other Services       Precautions / Restrictions Precautions Precaution Comments: did have cervical brace last admission, donned for comfort, however does not have this hospitalization. Restrictions Weight Bearing Restrictions: No     Mobility  Bed Mobility               General bed mobility comments: Not tested, pt received in recliner    Transfers Overall transfer level: Needs assistance Equipment used:  Rollator (4 wheels) Transfers: Sit to/from Stand Sit to Stand: Min assist           General transfer comment: needs slight assist for lift off. Once standing, upright posture noted. Pt used personal rollator and 2L of O2 donned for mobility    Ambulation/Gait Ambulation/Gait assistance: Min guard Gait Distance (Feet): 80 Feet Assistive device: Rolling walker (2 wheels) Gait Pattern/deviations: Step-through pattern, Decreased step length - right, Decreased step length - left, Decreased stride length, Trunk flexed       General Gait Details: ambulated using reciprocal gait pattern and safe technique. Slight fatigue. O2 sats WNL with exertion. Occasional cues for safety during turns.   Stairs             Wheelchair Mobility    Modified Rankin (Stroke Patients Only)       Balance Overall balance assessment: Needs assistance Sitting-balance support: Feet supported Sitting balance-Leahy Scale: Good     Standing balance support: Reliant on assistive device for balance, During functional activity, Bilateral upper extremity supported Standing balance-Leahy Scale: Fair                              Cognition Arousal/Alertness: Awake/alert Behavior During Therapy: WFL for tasks assessed/performed Overall Cognitive Status: Within Functional Limits for tasks assessed                                 General Comments: very pleasant and talkative        Exercises Other Exercises Other Exercises: ambulated to bathroom with cga. Needs mod assist for hygiene. Used elevated  toilet with cga for transfers. Other Exercises: educated on muscle soreness and performed LAQ x 10 reps    General Comments        Pertinent Vitals/Pain Pain Assessment Pain Assessment: Faces Faces Pain Scale: Hurts little more Pain Location: R lateral quad Pain Descriptors / Indicators: Discomfort, Sore Pain Intervention(s): Limited activity within patient's tolerance,  Repositioned    Home Living                          Prior Function            PT Goals (current goals can now be found in the care plan section) Acute Rehab PT Goals Patient Stated Goal: to improve safety and decrease falls risk PT Goal Formulation: With patient Time For Goal Achievement: 02/12/23 Potential to Achieve Goals: Good Progress towards PT goals: Progressing toward goals    Frequency    Min 2X/week      PT Plan Current plan remains appropriate    Co-evaluation              AM-PAC PT "6 Clicks" Mobility   Outcome Measure  Help needed turning from your back to your side while in a flat bed without using bedrails?: None Help needed moving from lying on your back to sitting on the side of a flat bed without using bedrails?: None Help needed moving to and from a bed to a chair (including a wheelchair)?: A Little Help needed standing up from a chair using your arms (e.g., wheelchair or bedside chair)?: A Little Help needed to walk in hospital room?: A Little Help needed climbing 3-5 steps with a railing? : A Lot 6 Click Score: 19    End of Session Equipment Utilized During Treatment: Oxygen Activity Tolerance: Patient tolerated treatment well Patient left: in chair Nurse Communication: Mobility status PT Visit Diagnosis: Muscle weakness (generalized) (M62.81);History of falling (Z91.81);Pain;Difficulty in walking, not elsewhere classified (R26.2) Pain - Right/Left:  (R leg) Pain - part of body: Leg     Time: 0933-1000 PT Time Calculation (min) (ACUTE ONLY): 27 min  Charges:  $Gait Training: 8-22 mins $Therapeutic Activity: 8-22 mins                     Greggory Stallion, PT, DPT, GCS [phone]    Jemeka Wagler 02/02/2023, 10:51 AM

## 2023-02-02 NOTE — Progress Notes (Addendum)
Progress Note    Helen Ramsey  [id number] DOB: 30-Jul-1933  DOA: 01/28/2023 PCP: Virginia Crews, MD      Brief Narrative:    Medical records reviewed and are as summarized below:  Helen Ramsey is a 89 y.o. female  with medical history significant for HFpEF, chronic hypoxic respiratory failure on 2 L of supplemental oxygen, HFpEF, osteoporosis, CKD stage IIIb, chronic headaches, morphea on methotrexate, Who lives alone and at baseline is ambulant with a walker, hospitalized from 2/18 to 01/09/2023 with fall resulting in subdural hematoma and C3 and T2 fracture.  She presented to the hospital because of generalized weakness, difficulty getting out of bed, confusion and worsening cough.  She was febrile in the emergency department with temperature of 101.1 F and she tested positive for COVID-19 infection.       Assessment/Plan:   Principal Problem:   COVID-19 virus infection Active Problems:   Generalized weakness   History of fall 01/04/23 with subdural hematoma, C3 and T2 2 fracture   Stage 3b chronic kidney disease (HCC)   (HFpEF) heart failure with preserved ejection fraction (HCC)   Chronic respiratory failure with hypoxia (HCC)   Essential (primary) hypertension   Acquired hypothyroidism   Morphea   Frequent falls   Thrombocytopenia (HCC)   Acute deep vein thrombosis (DVT) of right popliteal vein (HCC)     Body mass index is 32.1 kg/m.  (Obesity)   COVID-19 infection: Completed 5-day course of Paxlovid today.  Acute right popliteal DVT: She complained of pain in the right posterior thigh and knee.  Venous duplex of the lower extremities showed acute right popliteal DVT.  She will be started on therapeutic Lovenox followed by full dose Eliquis tomorrow.  Aspirin will be discontinued while she is on full dose anticoagulation.  Because of recent 4 mm subdural hematoma on CT head on 01/04/2023, case was discussed with Dr. Izora Ribas, neurosurgeon.   He said it is okay to start anticoagulation because he is 30 days out from subdural hematoma and hematoma was small.  This was discussed with patient and her son, Helen Ramsey.   They are okay with anticoagulation. They understand that patient is at high risk for bleeding (including but not limited to intracranial bleeding, gastrointestinal bleeding) because of advanced age.   General weakness, frequent falls: PT and OT recommended discharge to SNF.    Thrombocytopenia: Resolved  Chronic diastolic CHF: Compensated.  Continue torsemide.  Chronic hypoxic respiratory failure: She is on 2 L/min oxygen at baseline  Anxiety: Continue buspirone and Xanax as needed.  Discontinue Haldol.    History of subdural hematoma s/p fall on 01/04/2023, history of C3 and T2 fracture, CKD stage IIIb, hypertension, morphea on methotrexate, hypothyroidism     Diet Order             Diet Heart Room service appropriate? Yes; Fluid consistency: Thin  Diet effective now                            Consultants: None  Procedures: None    Medications:    busPIRone  5 mg Oral BID   citalopram  20 mg Oral Daily   enoxaparin (LOVENOX) injection  1 mg/kg Subcutaneous Q12H   famotidine  20 mg Oral Daily   fluticasone furoate-vilanterol  1 puff Inhalation Daily   haloperidol  0.5 mg Oral BID   hydroxychloroquine  200 mg Oral Daily  levothyroxine  50 mcg Oral Q0600   linaclotide  145 mcg Oral QAC breakfast   [START ON 02/05/2023] methotrexate  10 mg Oral Weekly   pantoprazole  40 mg Oral Daily   polyethylene glycol  17 g Oral Daily   torsemide  20 mg Oral BID   Continuous Infusions:   Anti-infectives (From admission, onward)    Start     Dose/Rate Route Frequency Ordered Stop   01/29/23 1000  hydroxychloroquine (PLAQUENIL) tablet 200 mg        200 mg Oral Daily 01/29/23 0058     01/28/23 2330  nirmatrelvir/ritonavir (renal dosing) (PAXLOVID) 2 tablet  Status:  Discontinued        2 tablet  Oral 2 times daily 01/28/23 2327 02/02/23 1442   01/28/23 2130  vancomycin (VANCOCIN) IVPB 1000 mg/200 mL premix        1,000 mg 200 mL/hr over 60 Minutes Intravenous  Once 01/28/23 2125 01/29/23 0003   01/28/23 2130  ceFEPIme (MAXIPIME) 2 g in sodium chloride 0.9 % 100 mL IVPB        2 g 200 mL/hr over 30 Minutes Intravenous  Once 01/28/23 2125 01/28/23 2236              Family Communication/Anticipated D/C date and plan/Code Status   DVT prophylaxis:      Code Status: DNR  Family Communication: Plan discussed with her son, Helen Ramsey, at the bedside Disposition Plan: Plan to discharge to SNF.   Status is: Inpatient Remains inpatient appropriate because: New right popliteal DVT       Subjective:   Interval events noted.  She complains of pain in the back of the right thigh and right knee.  [name], son, was at the bedside  Objective:    Vitals:   02/01/23 0914 02/01/23 1539 02/01/23 2249 02/02/23 0829  BP: (!) 155/53 (!) 144/86 (!) 164/76 (!) 159/65  Pulse: 63 71 76 66  Resp: 18 19 20    Temp: 97.8 F (36.6 C) 98 F (36.7 C) (!) 97.4 F (36.3 C) 98.7 F (37.1 C)  TempSrc: Oral Oral  Oral  SpO2: 99% 96% 99% 99%  Weight:      Height:       No data found.  No intake or output data in the 24 hours ending 02/02/23 1539  Filed Weights   01/28/23 2042 01/28/23 2130  Weight: 72.1 kg 72.1 kg    Exam:  GEN: NAD SKIN: Warm and dry EYES: EOMI ENT: MMM CV: RRR PULM: CTA B ABD: soft, obese, NT, +BS CNS: AAO x 3, non focal EXT: Right posterior thigh and posterior knee tenderness without swelling or erythema    Data Reviewed:   I have personally reviewed following labs and imaging studies:  Labs: Labs show the following:   Basic Metabolic Panel: Recent Labs  Lab 01/28/23 1945 01/29/23 0430  NA 136 135  K 3.7 3.6  CL 94* 96*  CO2 29 30  GLUCOSE 115* 89  BUN 25* 23  CREATININE 1.45* 1.24*  CALCIUM 8.7* 8.7*   GFR Estimated Creatinine  Clearance: 27.7 mL/min (A) (by C-G formula based on SCr of 1.24 mg/dL (H)). Liver Function Tests: Recent Labs  Lab 01/28/23 1945  AST 37  ALT 36  ALKPHOS 100  BILITOT 0.5  PROT 6.8  ALBUMIN 3.5   No results for input(s): "LIPASE", "AMYLASE" in the last 168 hours. No results for input(s): "AMMONIA" in the last 168 hours. Coagulation profile No results for  input(s): "INR", "PROTIME" in the last 168 hours.  CBC: Recent Labs  Lab 01/28/23 1945 01/29/23 0430 02/01/23 0334  WBC 4.5 4.4 4.2  NEUTROABS 3.1  --  2.0  HGB 11.4* 11.0* 12.0  HCT 34.9* 33.8* 35.7*  MCV 99.7 99.1 95.7  PLT 149* 113* 207   Cardiac Enzymes: No results for input(s): "CKTOTAL", "CKMB", "CKMBINDEX", "TROPONINI" in the last 168 hours. BNP (last 3 results) No results for input(s): "PROBNP" in the last 8760 hours. CBG: No results for input(s): "GLUCAP" in the last 168 hours. D-Dimer: No results for input(s): "DDIMER" in the last 72 hours. Hgb A1c: No results for input(s): "HGBA1C" in the last 72 hours. Lipid Profile: No results for input(s): "CHOL", "HDL", "LDLCALC", "TRIG", "CHOLHDL", "LDLDIRECT" in the last 72 hours. Thyroid function studies: No results for input(s): "TSH", "T4TOTAL", "T3FREE", "THYROIDAB" in the last 72 hours.  Invalid input(s): "FREET3" Anemia work up: No results for input(s): "VITAMINB12", "FOLATE", "FERRITIN", "TIBC", "IRON", "RETICCTPCT" in the last 72 hours. Sepsis Labs: Recent Labs  Lab 01/28/23 1945 01/28/23 2123 01/29/23 0430 02/01/23 0334  WBC 4.5  --  4.4 4.2  LATICACIDVEN 2.1* 1.3  --   --     Microbiology Recent Results (from the past 240 hour(s))  Blood Culture (routine x 2)     Status: None   Collection Time: 01/28/23  7:45 PM   Specimen: BLOOD  Result Value Ref Range Status   Specimen Description   Final    BLOOD BLOOD LEFT ARM Performed at [facility], [address] [place], [place] [address]    Special Requests   Final    BOTTLES DRAWN  AEROBIC AND ANAEROBIC Blood Culture adequate volume Performed at [facility], [address] [place], [place] [address]    Culture   Final    NO GROWTH 5 DAYS Performed at [facility], [place] [address], [place], [place] [address]    Report Status 02/02/2023 FINAL  Final  Resp panel by RT-PCR (RSV, Flu A&B, Covid) Anterior Nasal Swab     Status: Abnormal   Collection Time: 01/28/23  7:45 PM   Specimen: Anterior Nasal Swab  Result Value Ref Range Status   SARS Coronavirus 2 by RT PCR POSITIVE (A) NEGATIVE Final    Comment: (NOTE) SARS-CoV-2 target nucleic acids are DETECTED.  The SARS-CoV-2 RNA is generally detectable in upper respiratory specimens during the acute phase of infection. Positive results are indicative of the presence of the identified virus, but do not rule out bacterial infection or co-infection with other pathogens not detected by the test. Clinical correlation with patient history and other diagnostic information is necessary to determine patient infection status. The expected result is Negative.  Fact Sheet for Patients: EntrepreneurPulse.com.au  Fact Sheet for Healthcare Providers: IncredibleEmployment.be  This test is not yet approved or cleared by the [place] FDA and  has been authorized for detection and/or diagnosis of SARS-CoV-2 by FDA under an Emergency Use Authorization (EUA).  This EUA will remain in effect (meaning this test can be used) for the duration of  the COVID-19 declaration under Section 564(b)(1) of the A ct, 21 U.S.C. section 360bbb-3(b)(1), unless the authorization is terminated or revoked sooner.     Influenza A by PCR NEGATIVE NEGATIVE Final   Influenza B by PCR NEGATIVE NEGATIVE Final    Comment: (NOTE) The Xpert Xpress SARS-CoV-2/FLU/RSV plus assay is intended as an aid in the diagnosis of influenza from Nasopharyngeal swab specimens and should  not be used as a sole basis  for treatment. Nasal washings and aspirates are unacceptable for Xpert Xpress SARS-CoV-2/FLU/RSV testing.  Fact Sheet for Patients: EntrepreneurPulse.com.au  Fact Sheet for Healthcare Providers: IncredibleEmployment.be  This test is not yet approved or cleared by the [place] FDA and has been authorized for detection and/or diagnosis of SARS-CoV-2 by FDA under an Emergency Use Authorization (EUA). This EUA will remain in effect (meaning this test can be used) for the duration of the COVID-19 declaration under Section 564(b)(1) of the Act, 21 U.S.C. section 360bbb-3(b)(1), unless the authorization is terminated or revoked.     Resp Syncytial Virus by PCR NEGATIVE NEGATIVE Final    Comment: (NOTE) Fact Sheet for Patients: EntrepreneurPulse.com.au  Fact Sheet for Healthcare Providers: IncredibleEmployment.be  This test is not yet approved or cleared by the [place] FDA and has been authorized for detection and/or diagnosis of SARS-CoV-2 by FDA under an Emergency Use Authorization (EUA). This EUA will remain in effect (meaning this test can be used) for the duration of the COVID-19 declaration under Section 564(b)(1) of the Act, 21 U.S.C. section 360bbb-3(b)(1), unless the authorization is terminated or revoked.  Performed at [facility], [address] [place], [place] [address]   Blood Culture (routine x 2)     Status: None   Collection Time: 01/28/23 10:02 PM   Specimen: BLOOD  Result Value Ref Range Status   Specimen Description   Final    BLOOD BLOOD RIGHT ARM Performed at [facility], [address] [place], [place] [address]    Special Requests   Final    BOTTLES DRAWN AEROBIC AND ANAEROBIC Blood Culture results may not be optimal due to an inadequate volume of blood received in culture bottles Performed at [facility], [address] [place],  [place] [address]    Culture   Final    NO GROWTH 5 DAYS Performed at [facility], [place] [address] [place],  [address]    Report Status 02/02/2023 FINAL  Final    Procedures and diagnostic studies:  US Venous Img Lower Bilateral (DVT)  Result Date: 02/02/2023 CLINICAL DATA:  Lower extremity pain EXAM: BILATERAL LOWER EXTREMITY VENOUS DOPPLER ULTRASOUND TECHNIQUE: Gray-scale sonography with graded compression, as well as color Doppler and duplex ultrasound were performed to evaluate the lower extremity deep venous systems from the level of the common femoral vein and including the common femoral, femoral, profunda femoral, popliteal and calf veins including the posterior tibial, peroneal and gastrocnemius veins when visible. Spectral Doppler was utilized to evaluate flow at rest and with distal augmentation maneuvers in the common femoral, femoral and popliteal veins. COMPARISON:  None Available. FINDINGS: RIGHT LOWER EXTREMITY Common Femoral Vein: No evidence of thrombus. Normal compressibility, respiratory phasicity and response to augmentation. Saphenofemoral Junction: No evidence of thrombus. Normal compressibility and flow on color Doppler imaging. Profunda Femoral Vein: No evidence of thrombus. Normal compressibility and flow on color Doppler imaging. Femoral Vein: No evidence of thrombus. Normal compressibility, respiratory phasicity and response to augmentation. Popliteal Vein: Ill-defined hypoechoic intraluminal thrombus. Vessel noncompressible. Thrombus nonocclusive. Calf Veins: Popliteal thrombus does appear to extend into the right posterior tibial veins which are also noncompressible. LEFT LOWER EXTREMITY Common Femoral Vein: No evidence of thrombus. Normal compressibility, respiratory phasicity and response to augmentation. Saphenofemoral Junction: No evidence of thrombus. Normal compressibility and flow on color Doppler imaging. Profunda Femoral Vein: No evidence of thrombus. Normal  compressibility and flow on color Doppler imaging.  Femoral Vein: No evidence of thrombus. Normal compressibility, respiratory phasicity and response to augmentation. Popliteal Vein: No evidence of thrombus. Normal compressibility, respiratory phasicity and response to augmentation. Calf Veins: No evidence of thrombus. Normal compressibility and flow on color Doppler imaging. Superficial Great Saphenous Vein: No evidence of thrombus. Normal compressibility. Venous Reflux:  None. Other Findings:  None. IMPRESSION: 1. Positive exam for right popliteal nonocclusive DVT extending into the right posterior tibial veins. 2. Negative for left lower extremity DVT. Electronically Signed   By: Jerilynn Mages.  Shick M.D.   On: 02/02/2023 11:59               LOS: 4 days   Glender [place]  [facility]   Pager on [email or web address]. If 7PM-7AM, please contact night-coverage at [email or web address]     02/02/2023, 3:39 PM

## 2023-02-02 NOTE — Care Management Important Message (Signed)
Important Message  Patient Details  Name: Helen Ramsey MRN: [id number] Date of Birth: Apr 01, 1933   Medicare Important Message Given:  Yes  Reviewed Medicare IM with Clerance Lav, son, at [phone].  Copy to be given to son when he arrives to hospital.     Dannette Barbara 02/02/2023, 2:26 PM

## 2023-02-03 DIAGNOSIS — S065X0D Traumatic subdural hemorrhage without loss of consciousness, subsequent encounter: Secondary | ICD-10-CM | POA: Diagnosis not present

## 2023-02-03 DIAGNOSIS — E038 Other specified hypothyroidism: Secondary | ICD-10-CM | POA: Diagnosis not present

## 2023-02-03 DIAGNOSIS — N1832 Chronic kidney disease, stage 3b: Secondary | ICD-10-CM | POA: Diagnosis not present

## 2023-02-03 DIAGNOSIS — R531 Weakness: Secondary | ICD-10-CM | POA: Diagnosis not present

## 2023-02-03 DIAGNOSIS — S12200D Unspecified displaced fracture of third cervical vertebra, subsequent encounter for fracture with routine healing: Secondary | ICD-10-CM | POA: Diagnosis not present

## 2023-02-03 DIAGNOSIS — M62838 Other muscle spasm: Secondary | ICD-10-CM | POA: Diagnosis not present

## 2023-02-03 DIAGNOSIS — F411 Generalized anxiety disorder: Secondary | ICD-10-CM | POA: Diagnosis not present

## 2023-02-03 DIAGNOSIS — I130 Hypertensive heart and chronic kidney disease with heart failure and stage 1 through stage 4 chronic kidney disease, or unspecified chronic kidney disease: Secondary | ICD-10-CM | POA: Diagnosis not present

## 2023-02-03 DIAGNOSIS — G4700 Insomnia, unspecified: Secondary | ICD-10-CM | POA: Diagnosis not present

## 2023-02-03 DIAGNOSIS — R079 Chest pain, unspecified: Secondary | ICD-10-CM | POA: Diagnosis not present

## 2023-02-03 DIAGNOSIS — Z7901 Long term (current) use of anticoagulants: Secondary | ICD-10-CM | POA: Diagnosis not present

## 2023-02-03 DIAGNOSIS — Z7982 Long term (current) use of aspirin: Secondary | ICD-10-CM | POA: Diagnosis not present

## 2023-02-03 DIAGNOSIS — K219 Gastro-esophageal reflux disease without esophagitis: Secondary | ICD-10-CM | POA: Diagnosis not present

## 2023-02-03 DIAGNOSIS — M5136 Other intervertebral disc degeneration, lumbar region: Secondary | ICD-10-CM | POA: Diagnosis not present

## 2023-02-03 DIAGNOSIS — R001 Bradycardia, unspecified: Secondary | ICD-10-CM | POA: Diagnosis not present

## 2023-02-03 DIAGNOSIS — G2581 Restless legs syndrome: Secondary | ICD-10-CM | POA: Diagnosis not present

## 2023-02-03 DIAGNOSIS — I5030 Unspecified diastolic (congestive) heart failure: Secondary | ICD-10-CM | POA: Diagnosis not present

## 2023-02-03 DIAGNOSIS — Z7401 Bed confinement status: Secondary | ICD-10-CM | POA: Diagnosis not present

## 2023-02-03 DIAGNOSIS — W19XXXD Unspecified fall, subsequent encounter: Secondary | ICD-10-CM | POA: Diagnosis not present

## 2023-02-03 DIAGNOSIS — M150 Primary generalized (osteo)arthritis: Secondary | ICD-10-CM | POA: Diagnosis not present

## 2023-02-03 DIAGNOSIS — Z9981 Dependence on supplemental oxygen: Secondary | ICD-10-CM | POA: Diagnosis not present

## 2023-02-03 DIAGNOSIS — E559 Vitamin D deficiency, unspecified: Secondary | ICD-10-CM | POA: Diagnosis not present

## 2023-02-03 DIAGNOSIS — R296 Repeated falls: Secondary | ICD-10-CM | POA: Diagnosis not present

## 2023-02-03 DIAGNOSIS — E876 Hypokalemia: Secondary | ICD-10-CM | POA: Diagnosis not present

## 2023-02-03 DIAGNOSIS — Z8616 Personal history of COVID-19: Secondary | ICD-10-CM | POA: Diagnosis not present

## 2023-02-03 DIAGNOSIS — E039 Hypothyroidism, unspecified: Secondary | ICD-10-CM | POA: Diagnosis not present

## 2023-02-03 DIAGNOSIS — K5901 Slow transit constipation: Secondary | ICD-10-CM | POA: Diagnosis not present

## 2023-02-03 DIAGNOSIS — D696 Thrombocytopenia, unspecified: Secondary | ICD-10-CM | POA: Diagnosis not present

## 2023-02-03 DIAGNOSIS — M8008XS Age-related osteoporosis with current pathological fracture, vertebra(e), sequela: Secondary | ICD-10-CM | POA: Diagnosis not present

## 2023-02-03 DIAGNOSIS — U071 COVID-19: Secondary | ICD-10-CM | POA: Diagnosis not present

## 2023-02-03 DIAGNOSIS — S22020D Wedge compression fracture of second thoracic vertebra, subsequent encounter for fracture with routine healing: Secondary | ICD-10-CM | POA: Diagnosis not present

## 2023-02-03 DIAGNOSIS — J9611 Chronic respiratory failure with hypoxia: Secondary | ICD-10-CM | POA: Diagnosis not present

## 2023-02-03 DIAGNOSIS — E7800 Pure hypercholesterolemia, unspecified: Secondary | ICD-10-CM | POA: Diagnosis not present

## 2023-02-03 DIAGNOSIS — M6281 Muscle weakness (generalized): Secondary | ICD-10-CM | POA: Diagnosis not present

## 2023-02-03 DIAGNOSIS — K5190 Ulcerative colitis, unspecified, without complications: Secondary | ICD-10-CM | POA: Diagnosis not present

## 2023-02-03 DIAGNOSIS — I959 Hypotension, unspecified: Secondary | ICD-10-CM | POA: Diagnosis not present

## 2023-02-03 DIAGNOSIS — L940 Localized scleroderma [morphea]: Secondary | ICD-10-CM | POA: Diagnosis not present

## 2023-02-03 DIAGNOSIS — M5481 Occipital neuralgia: Secondary | ICD-10-CM | POA: Diagnosis not present

## 2023-02-03 DIAGNOSIS — I82431 Acute embolism and thrombosis of right popliteal vein: Secondary | ICD-10-CM | POA: Diagnosis not present

## 2023-02-03 DIAGNOSIS — Z96651 Presence of right artificial knee joint: Secondary | ICD-10-CM | POA: Diagnosis not present

## 2023-02-03 DIAGNOSIS — R197 Diarrhea, unspecified: Secondary | ICD-10-CM | POA: Diagnosis not present

## 2023-02-03 DIAGNOSIS — F418 Other specified anxiety disorders: Secondary | ICD-10-CM | POA: Diagnosis not present

## 2023-02-03 DIAGNOSIS — E782 Mixed hyperlipidemia: Secondary | ICD-10-CM | POA: Diagnosis not present

## 2023-02-03 DIAGNOSIS — D508 Other iron deficiency anemias: Secondary | ICD-10-CM | POA: Diagnosis not present

## 2023-02-03 MED ORDER — ALPRAZOLAM 0.25 MG PO TABS: 0.2500 mg | ORAL_TABLET | Freq: Two times a day (BID) | ORAL | 0 refills | Status: DC | PRN

## 2023-02-03 MED ORDER — APIXABAN 5 MG PO TABS: 10.0000 mg | ORAL_TABLET | Freq: Two times a day (BID) | ORAL | Status: DC

## 2023-02-03 MED ORDER — APIXABAN 5 MG PO TABS: 5.0000 mg | ORAL_TABLET | Freq: Two times a day (BID) | ORAL | Status: AC

## 2023-02-03 MED ORDER — APIXABAN 5 MG PO TABS: 5.0000 mg | ORAL_TABLET | Freq: Two times a day (BID) | ORAL | Status: DC

## 2023-02-03 MED ORDER — TRAMADOL HCL 50 MG PO TABS: 50.0000 mg | ORAL_TABLET | Freq: Three times a day (TID) | ORAL | 0 refills | Status: DC | PRN

## 2023-02-03 MED ORDER — APIXABAN 5 MG PO TABS: 10.0000 mg | ORAL_TABLET | Freq: Two times a day (BID) | ORAL | 0 refills | Status: DC

## 2023-02-03 NOTE — Discharge Summary (Signed)
Physician Discharge Summary   Patient: Helen Ramsey MRN: [id number] DOB: 1933-04-11  Admit date:     01/28/2023  Discharge date: 02/03/23  Discharge Physician: Jennye Boroughs   PCP: Virginia Crews, MD   Recommendations at discharge:   Follow-up with PCP in 2 weeks.   Follow-up with physician at the nursing home within 3 days of discharge  Discharge Diagnoses: Principal Problem:   COVID-19 virus infection Active Problems:   Generalized weakness   History of fall 01/04/23 with subdural hematoma, C3 and T2 2 fracture   Stage 3b chronic kidney disease (HCC)   (HFpEF) heart failure with preserved ejection fraction (HCC)   Chronic respiratory failure with hypoxia (HCC)   Essential (primary) hypertension   Acquired hypothyroidism   Morphea   Frequent falls   Thrombocytopenia (HCC)   Acute deep vein thrombosis (DVT) of right popliteal vein (HCC)  Resolved Problems:   * No resolved hospital problems. [facility] Course:  [name] is a 89 y.o. female  with medical history significant for HFpEF, chronic hypoxic respiratory failure on 2 L of supplemental oxygen, HFpEF, osteoporosis, CKD stage IIIb, chronic headaches, morphea on methotrexate, Who lives alone and at baseline is ambulant with a walker, hospitalized from 2/18 to 01/09/2023 with fall resulting in subdural hematoma and C3 and T2 fracture.  She presented to the hospital because of generalized weakness, difficulty getting out of bed, confusion and worsening cough.   She was febrile in the emergency department with temperature of 101.1 F and she tested positive for COVID-19 infection.  She was treated with a 5-day course of Paxlovid.  On admission, she complained of pain in the right posterior thigh.  Venous duplex of the lower extremities revealed right popliteal DVT.  She was started on Lovenox and subsequently transitioned to Eliquis for treatment of DVT.  Case was discussed with Dr. Cari Caraway, neurosurgeon,  because of recent subdural hematoma.  He said he was okay for full dose anticoagulation because he was 30 days out of the small subdural hematoma.   She was evaluated by PT and OT recommended further rehabilitation to SNF because of frequent falls.  She is deemed stable for discharge today. Discharge plan discussed with [name], son, at the bedside.    Assessment and Plan:  COVID-19 infection: Completed 5-day course of Paxlovid.   Acute right popliteal DVT: She complained of pain in the right posterior thigh and knee.  Venous duplex of the lower extremities showed acute right popliteal DVT.  She was started on Lovenox and transition to Eliquis. Dr. Cari Caraway, neurosurgeon, is okay with full dose anticoagulation.    General weakness, frequent falls: PT and OT recommended discharge to SNF.     Thrombocytopenia: Resolved   Chronic diastolic CHF: Compensated.  Continue torsemide.   Chronic hypoxic respiratory failure: She is on 2 L/min oxygen at baseline   Anxiety: Continue buspirone and Xanax as needed.     History of subdural hematoma s/p fall on 01/04/2023, history of C3 and T2 fracture, CKD stage IIIb, hypertension, morphea on methotrexate, hypothyroidism          Consultants: None Procedures performed: None  Disposition: Skilled nursing facility Diet recommendation:  Cardiac diet DISCHARGE MEDICATION: Allergies as of 02/03/2023       Reactions   Amoxicillin    Clindamycin/lincomycin    Doxycycline    Levaquin [levofloxacin In D5w]    Mirabegron Nausea And Vomiting   Nsaids Other (See Comments)   GI upset  Oxycodone Nausea Only   Other reaction(s): Hallucination   Phenergan [promethazine Hcl]    Singulair [montelukast]    Vicodin [hydrocodone-acetaminophen]    Zocor [simvastatin]    Zoloft  [sertraline] Other (See Comments)   Ampicillin Rash   Bactrim [sulfamethoxazole-trimethoprim] Rash   Levofloxacin Other (See Comments)   Weakness   Penicillins Rash    Ampicillin, Clindamycin, Doxycycline Levaquin-severe headache, muscle and joint aches, weak   Tolmetin    Other reaction(s): Other (See Comments) GI upset        Medication List     STOP taking these medications    aspirin EC 81 MG tablet   diphenhydrAMINE 25 MG tablet Commonly known as: BENADRYL   nystatin powder Commonly known as: MYCOSTATIN/NYSTOP       TAKE these medications    acetaminophen 500 MG tablet Commonly known as: TYLENOL Take 1,000 mg by mouth in the morning and at bedtime.   albuterol 108 (90 Base) MCG/ACT inhaler Commonly known as: VENTOLIN HFA Inhale 2 puffs into the lungs every 6 (six) hours as needed for wheezing or shortness of breath.   ALPRAZolam 0.25 MG tablet Commonly known as: XANAX Take 1 tablet (0.25 mg total) by mouth 2 (two) times daily as needed for anxiety. TAKE 1 TABLET BY MOUTH TWICE DAILY AS NEEDED FOR ANXIETY What changed:  how much to take how to take this when to take this reasons to take this   apixaban 5 MG Tabs tablet Commonly known as: ELIQUIS Take 2 tablets (10 mg total) by mouth 2 (two) times daily for 7 days.   apixaban 5 MG Tabs tablet Commonly known as: ELIQUIS Take 1 tablet (5 mg total) by mouth 2 (two) times daily. Start taking on: February 10, 2023   atorvastatin 20 MG tablet Commonly known as: LIPITOR Take 1 tablet (20 mg total) by mouth daily.   citalopram 40 MG tablet Commonly known as: CELEXA Take 1 tablet (40 mg total) by mouth daily.   cyanocobalamin 1000 MCG tablet Take 1,000 mcg by mouth daily.   Emgality (300 MG Dose) 100 MG/ML Sosy Generic drug: Galcanezumab-gnlm Inject into the skin.   famotidine 20 MG tablet Commonly known as: PEPCID Take 1 tablet (20 mg total) by mouth daily.   ferrous sulfate 325 (65 FE) MG tablet Take 325 mg by mouth daily with supper.   Fish Oil 1200 MG Caps Take 1,200 mg by mouth daily.   FLUORIDE TOOTHPASTE DT Place onto teeth daily.   fluticasone  furoate-vilanterol 100-25 MCG/ACT Aepb Commonly known as: Breo Ellipta Inhale 1 puff into the lungs daily.   folic acid 1 MG tablet Commonly known as: FOLVITE Take 1 mg by mouth daily.   gabapentin 100 MG capsule Commonly known as: NEURONTIN Take 2 capsules (200 mg total) by mouth 2 (two) times daily AND 3 capsules (300 mg total) at bedtime.   glycerin adult 2 g suppository Place 1 suppository rectally as needed for constipation.   hydroxychloroquine 200 MG tablet Commonly known as: PLAQUENIL Take 200 mg by mouth daily.   levothyroxine 50 MCG tablet Commonly known as: SYNTHROID TAKE 1 TABLET(50 MCG) BY MOUTH DAILY BEFORE BREAKFAST What changed:  how much to take how to take this when to take this   linaclotide 145 MCG Caps capsule Commonly known as: Linzess Take 1 capsule (145 mcg total) by mouth daily before breakfast.   MAGNESIUM GLYCINATE PO Take 400 mg by mouth daily.   magnesium hydroxide 400 MG/5ML suspension Commonly known as:  MILK OF MAGNESIA Take 5 mLs by mouth daily as needed for mild constipation.   melatonin 5 MG Tabs Take 5 mg by mouth at bedtime as needed.   methotrexate 2.5 MG tablet Commonly known as: RHEUMATREX Take 10 mg by mouth once a week.   MIRALAX PO Take 17 g by mouth daily.   omeprazole 20 MG capsule Commonly known as: PRILOSEC TAKE 1 CAPSULE(20 MG) BY MOUTH DAILY   phenazopyridine 95 MG tablet Commonly known as: AZO-TABS Take 1 tablet (95 mg total) by mouth as needed. What changed: when to take this   potassium gluconate 595 (99 K) MG Tabs tablet Take 1 tablet (595 mg total) by mouth 2 (two) times daily.   senna 8.6 MG tablet Commonly known as: SENOKOT Take 1 tablet (8.6 mg total) by mouth daily.   sodium chloride 0.65 % nasal spray Commonly known as: OCEAN Place 1 spray into the nose as needed.   Torsemide 40 MG Tabs Take 40 mg by mouth 2 (two) times daily.   traMADol 50 MG tablet Commonly known as: ULTRAM Take 1  tablet (50 mg total) by mouth 3 (three) times daily as needed. What changed:  when to take this reasons to take this   triamcinolone cream 0.1 % Commonly known as: KENALOG 2 weeks on and off 2 weeks   VITAMIN D3 PO Take 1,000 Units by mouth.               Discharge Care Instructions  (From admission, onward)           Start     Ordered   02/03/23 0000  Discharge wound care:       Comments: Apply Mepilex border to sacral decubitus ulcer   02/03/23 1006            Contact information for after-discharge care     [facility] Preferred SNF .   Service: Skilled Nursing Contact information: [place] [phone]                    Discharge Exam: Danley Danker Weights   01/28/23 2042 01/28/23 2130  Weight: 72.1 kg 72.1 kg   GEN: NAD SKIN: Warm and dry EYES: No pallor or icterus ENT: MMM CV: RRR PULM: CTA B ABD: soft, obese, NT, +BS CNS: AAO x 3, non focal EXT: Right posterior thigh and knee tenderness.  No swelling or erythema   Condition at discharge: good  The results of significant diagnostics from this hospitalization (including imaging, microbiology, ancillary and laboratory) are listed below for reference.   Imaging Studies: US Venous Img Lower Bilateral (DVT)  Result Date: 02/02/2023 CLINICAL DATA:  Lower extremity pain EXAM: BILATERAL LOWER EXTREMITY VENOUS DOPPLER ULTRASOUND TECHNIQUE: Gray-scale sonography with graded compression, as well as color Doppler and duplex ultrasound were performed to evaluate the lower extremity deep venous systems from the level of the common femoral vein and including the common femoral, femoral, profunda femoral, popliteal and calf veins including the posterior tibial, peroneal and gastrocnemius veins when visible. Spectral Doppler was utilized to evaluate flow at rest and with  distal augmentation maneuvers in the common femoral, femoral and popliteal veins. COMPARISON:  None Available. FINDINGS: RIGHT LOWER EXTREMITY Common Femoral Vein: No evidence of thrombus. Normal compressibility, respiratory phasicity and response to augmentation. Saphenofemoral Junction: No evidence of thrombus. Normal compressibility and flow on color  Doppler imaging. Profunda Femoral Vein: No evidence of thrombus. Normal compressibility and flow on color Doppler imaging. Femoral Vein: No evidence of thrombus. Normal compressibility, respiratory phasicity and response to augmentation. Popliteal Vein: Ill-defined hypoechoic intraluminal thrombus. Vessel noncompressible. Thrombus nonocclusive. Calf Veins: Popliteal thrombus does appear to extend into the right posterior tibial veins which are also noncompressible. LEFT LOWER EXTREMITY Common Femoral Vein: No evidence of thrombus. Normal compressibility, respiratory phasicity and response to augmentation. Saphenofemoral Junction: No evidence of thrombus. Normal compressibility and flow on color Doppler imaging. Profunda Femoral Vein: No evidence of thrombus. Normal compressibility and flow on color Doppler imaging. Femoral Vein: No evidence of thrombus. Normal compressibility, respiratory phasicity and response to augmentation. Popliteal Vein: No evidence of thrombus. Normal compressibility, respiratory phasicity and response to augmentation. Calf Veins: No evidence of thrombus. Normal compressibility and flow on color Doppler imaging. Superficial Great Saphenous Vein: No evidence of thrombus. Normal compressibility. Venous Reflux:  None. Other Findings:  None. IMPRESSION: 1. Positive exam for right popliteal nonocclusive DVT extending into the right posterior tibial veins. 2. Negative for left lower extremity DVT. Electronically Signed   By: Jerilynn Mages.  Shick M.D.   On: 02/02/2023 11:59   DG Chest Port 1 View  Result Date: 01/28/2023 CLINICAL DATA:  Possible sepsis  EXAM: PORTABLE CHEST 1 VIEW COMPARISON:  01/04/2023, 12/10/2022 FINDINGS: Streaky basilar opacities. Low lung volumes. No pleural effusion. Stable cardiomediastinal silhouette with aortic atherosclerosis. IMPRESSION: Low lung volumes with streaky basilar opacities, atelectasis versus scarring. Electronically Signed   By: Donavan Foil M.D.   On: 01/28/2023 19:32   CT HEAD WO CONTRAST (5MM)  Result Date: 01/04/2023 CLINICAL DATA:  Follow-up subdural hematoma. EXAM: CT HEAD WITHOUT CONTRAST TECHNIQUE: Contiguous axial images were obtained from the base of the skull through the vertex without intravenous contrast. RADIATION DOSE REDUCTION: This exam was performed according to the departmental dose-optimization program which includes automated exposure control, adjustment of the mA and/or kV according to patient size and/or use of iterative reconstruction technique. COMPARISON:  01/04/2023 FINDINGS: Brain: Redemonstrated subdural hematoma along the left aspect of the falx and along the tentorium, which measures up to 3 mm, unchanged when remeasured similarly. No significant mass effect or midline shift. No evidence of acute infarction, parenchymal hemorrhage, mass, or hydrocephalus. Periventricular white matter changes, likely the sequela of chronic small vessel ischemic disease. Vascular: No hyperdense vessel. Skull: Negative for fracture or focal lesion. Sinuses/Orbits: No acute finding. Status post bilateral lens replacements. Other: The mastoid air cells are well aerated. IMPRESSION: Unchanged 3 mm subdural hematoma along the left aspect of the falx and tentorium. No significant mass effect or midline shift. Electronically Signed   By: Merilyn Baba M.D.   On: 01/04/2023 20:51   CT Cervical Spine Wo Contrast  Result Date: 01/04/2023 CLINICAL DATA:  [address] [address]. EXAM: CT CERVICAL SPINE WITHOUT CONTRAST TECHNIQUE: Multidetector CT imaging of the cervical spine was performed without intravenous contrast. Multiplanar CT  image reconstructions were also generated. RADIATION DOSE REDUCTION: This exam was performed according to the departmental dose-optimization program which includes automated exposure control, adjustment of the mA and/or kV according to patient size and/or use of iterative reconstruction technique. COMPARISON:  12/10/2022 FINDINGS: Alignment: Stable alignment of the cervical vertebral bodies. Degenerative anterior subluxations at C4 and C5 are unchanged. Skull base and vertebrae: Advanced degenerative changes at C1-2 with pannus formation but no significant mass effect on the upper cervical cord. There is an acute fracture involving the right lamina and spinous process of C3.  The pedicles are intact. No facet fractures. The facets are normally aligned. There is also a compression fracture T2. Soft tissues and spinal canal: No prevertebral fluid or swelling. No visible canal hematoma. Disc levels:  Stable calcified disc protrusions at C4-5 and C5-6. Upper chest: The lung apices are grossly clear. Other: No neck mass or adenopathy or hematoma. Bilateral carotid artery calcifications. IMPRESSION: 1. Acute fracture involving the right lamina and spinous process of C3. 2. Compression fracture of T2. 3. Stable degenerative anterior subluxations at C4 and C5. 4. Advanced degenerative changes at C1-2 with pannus formation but no significant mass effect on the upper cervical cord. 5. Stable calcified disc protrusions at C4-5 and C5-6. Electronically Signed   By: Marijo Sanes M.D.   On: 01/04/2023 14:33   CT Thoracic Spine Wo Contrast  Result Date: 01/04/2023 CLINICAL DATA:  [address] in parking lot. EXAM: CT THORACIC AND LUMBAR SPINE WITHOUT CONTRAST TECHNIQUE: Multidetector CT imaging of the thoracic and lumbar spine was performed without contrast. Multiplanar CT image reconstructions were also generated. RADIATION DOSE REDUCTION: This exam was performed according to the departmental dose-optimization program which includes  automated exposure control, adjustment of the mA and/or kV according to patient size and/or use of iterative reconstruction technique. COMPARISON:  Chest CT 12/10/2022 FINDINGS: CT THORACIC SPINE FINDINGS Alignment: Stable thoracic scoliosis and exaggerated thoracic kyphosis. Normal alignment in the sagittal plane. Vertebrae: There is a T2 compression fracture which was not present on the prior chest CT. The L1 compression fracture is chronic. No other thoracic compression fractures are identified. There is significant osteoporosis and changes of DISH. Paraspinal and other soft tissues: No significant paraspinal, posterior mediastinal or posterior long abnormality. A large calcified splenic cyst is noted. Advanced aortic calcifications but no aneurysm. Disc levels: The thoracic spinal canal is generous. No significant canal stenosis. No retropulsion at T2. CT LUMBAR SPINE FINDINGS Segmentation: Transitional lumbar anatomy noted with partial lumbarization of S1. Alignment: Degenerative anterolisthesis of L5. Vertebrae: Remote L1 compression fracture with mild retropulsion of the posterosuperior aspect of vertebral body. Advanced osteoporosis but no other fractures are identified. Advanced multilevel facet disease but no definite pars defects or fractures. Paraspinal and other soft tissues: No significant paraspinal or retroperitoneal findings. Advanced aortic calcifications but no aneurysm. Disc levels: T12-L1: Retropulsion of L1 with flattening of the ventral thecal sac but no significant canal stenosis. L1-2: No significant findings. L2-3: No significant findings. L3-4: Bulging annulus and advanced facet disease with ligamentum flavum thickening contributing to mild spinal and bilateral lateral recess stenosis. There is also mild right foraminal stenosis. L4-5: Diffuse bulging uncovered disc, advanced facet disease and ligamentum flavum thickening contributing to moderate spinal and bilateral lateral recess  stenosis. No significant foraminal stenosis. L5-S1: Bulging uncovered disc and severe facet disease with moderate spinal and bilateral lateral recess stenosis and mild bilateral foraminal stenosis. Small disc space at S1-2 but no significant findings. IMPRESSION: 1. New/acute T2 compression fracture. No significant retropulsion or canal compromise. 2. Chronic L1 compression fracture. 3. Advanced osteoporosis but no other fractures are identified. 4. Moderate spinal and bilateral lateral recess stenosis at L4-5 and L5-S1. 5. Mild spinal and bilateral lateral recess stenosis at L3-4. There is also mild right foraminal stenosis at this level. Aortic Atherosclerosis (ICD10-I70.0). Electronically Signed   By: Marijo Sanes M.D.   On: 01/04/2023 14:24   CT Lumbar Spine Wo Contrast  Result Date: 01/04/2023 CLINICAL DATA:  [address] in parking lot. EXAM: CT THORACIC AND LUMBAR SPINE WITHOUT CONTRAST TECHNIQUE:  Multidetector CT imaging of the thoracic and lumbar spine was performed without contrast. Multiplanar CT image reconstructions were also generated. RADIATION DOSE REDUCTION: This exam was performed according to the departmental dose-optimization program which includes automated exposure control, adjustment of the mA and/or kV according to patient size and/or use of iterative reconstruction technique. COMPARISON:  Chest CT 12/10/2022 FINDINGS: CT THORACIC SPINE FINDINGS Alignment: Stable thoracic scoliosis and exaggerated thoracic kyphosis. Normal alignment in the sagittal plane. Vertebrae: There is a T2 compression fracture which was not present on the prior chest CT. The L1 compression fracture is chronic. No other thoracic compression fractures are identified. There is significant osteoporosis and changes of DISH. Paraspinal and other soft tissues: No significant paraspinal, posterior mediastinal or posterior long abnormality. A large calcified splenic cyst is noted. Advanced aortic calcifications but no aneurysm.  Disc levels: The thoracic spinal canal is generous. No significant canal stenosis. No retropulsion at T2. CT LUMBAR SPINE FINDINGS Segmentation: Transitional lumbar anatomy noted with partial lumbarization of S1. Alignment: Degenerative anterolisthesis of L5. Vertebrae: Remote L1 compression fracture with mild retropulsion of the posterosuperior aspect of vertebral body. Advanced osteoporosis but no other fractures are identified. Advanced multilevel facet disease but no definite pars defects or fractures. Paraspinal and other soft tissues: No significant paraspinal or retroperitoneal findings. Advanced aortic calcifications but no aneurysm. Disc levels: T12-L1: Retropulsion of L1 with flattening of the ventral thecal sac but no significant canal stenosis. L1-2: No significant findings. L2-3: No significant findings. L3-4: Bulging annulus and advanced facet disease with ligamentum flavum thickening contributing to mild spinal and bilateral lateral recess stenosis. There is also mild right foraminal stenosis. L4-5: Diffuse bulging uncovered disc, advanced facet disease and ligamentum flavum thickening contributing to moderate spinal and bilateral lateral recess stenosis. No significant foraminal stenosis. L5-S1: Bulging uncovered disc and severe facet disease with moderate spinal and bilateral lateral recess stenosis and mild bilateral foraminal stenosis. Small disc space at S1-2 but no significant findings. IMPRESSION: 1. New/acute T2 compression fracture. No significant retropulsion or canal compromise. 2. Chronic L1 compression fracture. 3. Advanced osteoporosis but no other fractures are identified. 4. Moderate spinal and bilateral lateral recess stenosis at L4-5 and L5-S1. 5. Mild spinal and bilateral lateral recess stenosis at L3-4. There is also mild right foraminal stenosis at this level. Aortic Atherosclerosis (ICD10-I70.0). Electronically Signed   By: Marijo Sanes M.D.   On: 01/04/2023 14:24   CT HEAD WO  CONTRAST (5MM)  Result Date: 01/04/2023 CLINICAL DATA:  Provided history: Head trauma, minor. EXAM: CT HEAD WITHOUT CONTRAST TECHNIQUE: Contiguous axial images were obtained from the base of the skull through the vertex without intravenous contrast. RADIATION DOSE REDUCTION: This exam was performed according to the departmental dose-optimization program which includes automated exposure control, adjustment of the mA and/or kV according to patient size and/or use of iterative reconstruction technique. COMPARISON:  Head CT 12/10/2022. FINDINGS: Brain: Mild generalized parenchymal atrophy. Acute subdural hematoma along the left aspect of the falx, measuring up to 4 mm in thickness. Mild-to-moderate patchy and ill-defined hypoattenuation within the cerebral white matter, nonspecific but compatible with chronic small vessel disease. No demarcated cortical infarct. No evidence of an intracranial mass. No midline shift. Vascular: No hyperdense vessel.  Atherosclerotic calcifications. Skull: No fracture or aggressive osseous lesion. Sinuses/Orbits: No mass or acute finding within the imaged orbits. No significant paranasal sinus disease at the imaged levels. Impression #1 called by telephone at the time of interpretation on 01/04/2023 at 2:20 pm to provider JENISE MENSHEW ,  who verbally acknowledged these results. IMPRESSION: 1. Acute subdural hematoma along the left aspect of the falx, measuring up to 4 mm in thickness. 2. Mild-to-moderate chronic small vessel ischemic changes within the cerebral white matter. 3. Mild generalized parenchymal atrophy. Electronically Signed   By: Kellie Simmering D.O.   On: 01/04/2023 14:21   DG Chest 2 View  Result Date: 01/04/2023 CLINICAL DATA:  ant chest wall pain EXAM: CHEST - 2 VIEW COMPARISON:  12/10/2022 FINDINGS: Cardiac silhouette is unremarkable. No pneumothorax or pleural effusion. There is pulmonary vascular congestion without focal consolidation. Aorta is calcified. There are  thoracic degenerative changes. IMPRESSION: Pulmonary vascular congestion without focal consolidation. Electronically Signed   By: Sammie Bench M.D.   On: 01/04/2023 13:18   DG Hip Unilat W or Wo Pelvis 2-3 Views Left  Result Date: 01/04/2023 CLINICAL DATA:  Status post fall EXAM: DG HIP (WITH OR WITHOUT PELVIS) 2-3V LEFT COMPARISON:  CT AP 08/26/2021 FINDINGS: Both hips appear located. No signs of acute fracture or dislocation. No evidence for pelvic diastasis. Mild bilateral and symmetric osteoarthritis noted within the hips. IMPRESSION: 1. No acute findings. 2. Mild bilateral and symmetric osteoarthritis. Electronically Signed   By: Kerby Moors M.D.   On: 01/04/2023 13:12    Microbiology: Results for orders placed or performed during the hospital encounter of 01/28/23  Blood Culture (routine x 2)     Status: None   Collection Time: 01/28/23  7:45 PM   Specimen: BLOOD  Result Value Ref Range Status   Specimen Description   Final    BLOOD BLOOD LEFT ARM Performed at [facility], [address], [place], [place] [address]    Special Requests   Final    BOTTLES DRAWN AEROBIC AND ANAEROBIC Blood Culture adequate volume Performed at [facility], [address], [place], [place] [address]    Culture   Final    NO GROWTH 5 DAYS Performed at [facility], [place] [address], [place], [place] [address]    Report Status 02/02/2023 FINAL  Final  Resp panel by RT-PCR (RSV, Flu A&B, Covid) Anterior Nasal Swab     Status: Abnormal   Collection Time: 01/28/23  7:45 PM   Specimen: Anterior Nasal Swab  Result Value Ref Range Status   SARS Coronavirus 2 by RT PCR POSITIVE (A) NEGATIVE Final    Comment: (NOTE) SARS-CoV-2 target nucleic acids are DETECTED.  The SARS-CoV-2 RNA is generally detectable in upper respiratory specimens during the acute phase of infection. Positive results are indicative of the presence of the identified virus, but do not rule out bacterial  infection or co-infection with other pathogens not detected by the test. Clinical correlation with patient history and other diagnostic information is necessary to determine patient infection status. The expected result is Negative.  Fact Sheet for Patients: EntrepreneurPulse.com.au  Fact Sheet for Healthcare Providers: IncredibleEmployment.be  This test is not yet approved or cleared by the [place] FDA and  has been authorized for detection and/or diagnosis of SARS-CoV-2 by FDA under an Emergency Use Authorization (EUA).  This EUA will remain in effect (meaning this test can be used) for the duration of  the COVID-19 declaration under Section 564(b)(1) of the A ct, 21 U.S.C. section 360bbb-3(b)(1), unless the authorization is terminated or revoked sooner.     Influenza A by PCR NEGATIVE NEGATIVE Final   Influenza B by PCR NEGATIVE NEGATIVE Final    Comment: (NOTE) The Xpert Xpress SARS-CoV-2/FLU/RSV plus assay  is intended as an aid in the diagnosis of influenza from Nasopharyngeal swab specimens and should not be used as a sole basis for treatment. Nasal washings and aspirates are unacceptable for Xpert Xpress SARS-CoV-2/FLU/RSV testing.  Fact Sheet for Patients: EntrepreneurPulse.com.au  Fact Sheet for Healthcare Providers: IncredibleEmployment.be  This test is not yet approved or cleared by the [place] FDA and has been authorized for detection and/or diagnosis of SARS-CoV-2 by FDA under an Emergency Use Authorization (EUA). This EUA will remain in effect (meaning this test can be used) for the duration of the COVID-19 declaration under Section 564(b)(1) of the Act, 21 U.S.C. section 360bbb-3(b)(1), unless the authorization is terminated or revoked.     Resp Syncytial Virus by PCR NEGATIVE NEGATIVE Final    Comment: (NOTE) Fact Sheet for  Patients: EntrepreneurPulse.com.au  Fact Sheet for Healthcare Providers: IncredibleEmployment.be  This test is not yet approved or cleared by the [place] FDA and has been authorized for detection and/or diagnosis of SARS-CoV-2 by FDA under an Emergency Use Authorization (EUA). This EUA will remain in effect (meaning this test can be used) for the duration of the COVID-19 declaration under Section 564(b)(1) of the Act, 21 U.S.C. section 360bbb-3(b)(1), unless the authorization is terminated or revoked.  Performed at [facility], [address] [place], [place] [address]   Blood Culture (routine x 2)     Status: None   Collection Time: 01/28/23 10:02 PM   Specimen: BLOOD  Result Value Ref Range Status   Specimen Description   Final    BLOOD BLOOD RIGHT ARM Performed at [facility], [address], [place], [place] [address]    Special Requests   Final    BOTTLES DRAWN AEROBIC AND ANAEROBIC Blood Culture results may not be optimal due to an inadequate volume of blood received in culture bottles Performed at [facility], [address] [place], [place] [address]    Culture   Final    NO GROWTH 5 DAYS Performed at [facility], [place] [address] [place], [place] [address]    Report Status 02/02/2023 FINAL  Final    Labs: CBC: Recent Labs  Lab 01/28/23 1945 01/29/23 0430 02/01/23 0334  WBC 4.5 4.4 4.2  NEUTROABS 3.1  --  2.0  HGB 11.4* 11.0* 12.0  HCT 34.9* 33.8* 35.7*  MCV 99.7 99.1 95.7  PLT 149* 113* [id number]   Basic Metabolic Panel: Recent Labs  Lab 01/28/23 1945 01/29/23 0430  NA 136 135  K 3.7 3.6  CL 94* 96*  CO2 29 30  GLUCOSE 115* 89  BUN 25* 23  CREATININE 1.45* 1.24*  CALCIUM 8.7* 8.7*   Liver Function Tests: Recent Labs  Lab 01/28/23 1945  AST 37  ALT 36  ALKPHOS 100  BILITOT 0.5  PROT 6.8  ALBUMIN 3.5   CBG: No results for input(s): "GLUCAP" in the last 168  hours.  Discharge time spent: greater than 30 minutes.  Signed: Jennye Boroughs, MD [facility] 02/03/2023

## 2023-02-03 NOTE — Plan of Care (Signed)
  Problem: Respiratory: Goal: Will maintain a patent airway Outcome: Progressing Goal: Complications related to the disease process, condition or treatment will be avoided or minimized Outcome: Progressing   Problem: Clinical Measurements: Goal: Respiratory complications will improve Outcome: Progressing   Problem: Activity: Goal: Risk for activity intolerance will decrease Outcome: Progressing   Problem: Pain Managment: Goal: General experience of comfort will improve Outcome: Progressing   Problem: Safety: Goal: Ability to remain free from injury will improve Outcome: Progressing   Problem: Skin Integrity: Goal: Risk for impaired skin integrity will decrease Outcome: Progressing

## 2023-02-03 NOTE — Plan of Care (Signed)

## 2023-02-03 NOTE — Progress Notes (Signed)
ANTICOAGULATION CONSULT NOTE   Pharmacy Consult for apixaban Indication: Acute DVT right popliteal vein   Allergies  Allergen Reactions   Amoxicillin    Clindamycin/Lincomycin    Doxycycline    Levaquin [Levofloxacin In D5w]    Mirabegron Nausea And Vomiting   Nsaids Other (See Comments)    GI upset   Oxycodone Nausea Only    Other reaction(s): Hallucination   Phenergan [Promethazine Hcl]    Singulair [Montelukast]    Vicodin [Hydrocodone-Acetaminophen]    Zocor [Simvastatin]    Zoloft  [Sertraline] Other (See Comments)   Ampicillin Rash   Bactrim [Sulfamethoxazole-Trimethoprim] Rash   Levofloxacin Other (See Comments)    Weakness   Penicillins Rash    Ampicillin, Clindamycin, Doxycycline Levaquin-severe headache, muscle and joint aches, weak   Tolmetin     Other reaction(s): Other (See Comments) GI upset    Patient Measurements: Height: 4\' 11"  (149.9 cm) Weight: 72.1 kg (158 lb 15.2 oz) IBW/kg (Calculated) : 43.2  Vital Signs: Temp: 98 F (36.7 C) (03/19 0046) Temp Source: Oral (03/19 0046) BP: 150/50 (03/19 0046) Pulse Rate: 60 (03/19 0046)  Labs: Recent Labs    02/01/23 0334  HGB 12.0  HCT 35.7*  PLT 207     Estimated Creatinine Clearance: 27.7 mL/min (A) (by C-G formula based on SCr of 1.24 mg/dL (H)).   Medical History: Past Medical History:  Diagnosis Date   Abnormal SPEP    Allergic rhinitis, cause unspecified    Allergic rhinitis, cause unspecified    Atherosclerosis of aorta (HCC)    Back pain    lumbar   Degeneration of lumbar or lumbosacral intervertebral disc    Diverticulitis of colon (without mention of hemorrhage)(562.11)    Dizziness and giddiness    Dysthymic disorder    Headache(784.0)    Myalgia and myositis, unspecified    Osteoporosis, unspecified    Other and unspecified hyperlipidemia    Other diseases of nasal cavity and sinuses(478.19)    Other specified disorders of bladder    Syncope    Trigger finger (acquired)     Unspecified essential hypertension    Unspecified sleep apnea    Urinary frequency     Medications:  No prior to admission anticoagulation per fill history  Assessment: 89 year old female admitted with weakness and cough 2/2 COVID-19 on Paxlovid through 3/18. PMH includes HFpEF, anxiety, chronic respiratory failure, hypothyroidism, hypertension, and CKD3B. Recently admitted 01/04/2023 for subdural hematoma and C3 fracture noted to be stable on repeat CT in February 2024.  Venous doppler: positive for right popliteal nonocclusive DVT extending into the right posterior tibial veins.  Paxlovid completed. Baseline H&H acceptable. Hgb 12.0 and Plts 207 on 3/17  Goal of Therapy:  Monitor platelets by anticoagulation protocol: Yes   Plan: Transitioning enoxaparin (Lovenox) to apixaban (eliquis). Give apixaban 10 mg @1600 . Apixaban 10 mg BID x 7 days followed by 5 mg BID thereafter.  Glean Salvo, PharmD, BCPS Clinical Pharmacist  02/03/2023 7:13 AM

## 2023-02-03 NOTE — TOC Progression Note (Signed)
Transition of Care [facility]) - Progression Note    Patient Details  Name: Helen Ramsey MRN: [id number] Date of Birth: 04-30-1933  Transition of Care [facility]) CM/SW [place], RN Phone Number: 02/03/2023, 10:24 AM  Clinical Narrative:     Spoke with the Son [name] and notified him of the room number 28 He is aware of the DC today EMS arranged for transport, 2 ahead of her on the list   Expected Discharge Plan: [place] Barriers to Discharge: SNF Pending bed offer  Expected Discharge Plan and Services   Discharge Planning Services: CM Consult   Living arrangements for the past 2 months: Apartment Expected Discharge Date: 02/03/23                                     Social Determinants of Health (SDOH) Interventions SDOH Screenings   Food Insecurity: No Food Insecurity (01/29/2023)  Housing: [place]  (01/29/2023)  Transportation Needs: No Transportation Needs (01/29/2023)  Utilities: Not At Risk (01/29/2023)  Alcohol Screen: Low Risk  (12/18/2022)  Depression (PHQ2-9): Medium Risk (12/18/2022)  Financial Resource Strain: Low Risk  (05/06/2022)  Physical Activity: Insufficiently Active (05/06/2022)  Social Connections: Moderately Isolated (05/06/2022)  Stress: No Stress Concern Present (05/06/2022)  Tobacco Use: Low Risk  (01/29/2023)    Readmission Risk Interventions     No data to display

## 2023-02-03 NOTE — Consult Note (Signed)
   [facility] CM Inpatient Consult   02/03/2023  Helen Ramsey 05/16/1933 [id number]  Orientation with Natividad Brood, [facility] Liaison for review.   Location: [facility] Liaison screened patient remotely [facility]).   [facility]) [place] Patient: Insurance ( Medicare ACO)   Primary Care Provider:  Virginia Crews, MD with [facility]  Patient (pt) screened for less than 30 days hospitalization readmission with noted medium risk score for unplanned readmission risk. [facility] liaison assessed for potential [facility]) Care Management service needs for post hospital transition for care coordination.     Plan:  [facility] hospital liaison assessed for ongoing disposition to assess for post hospital community care coordination/management needs.  Discharged disposition: Pt will transfer to  SNF facility. [facility] RN liaison with make referral to the PAC-RN for ongoing follow up   [place] does not replace or interfere with any arrangements made by the Inpatient Transition of Care team.   For questions contact:    Raina Mina, RN, [place] Office Hours M-F 8:00 am to 5 pm [phone] mobile [phone] [Office toll free line][facility] Office Hours are M-F 8:30 - 5 pm 24 hour nurse advise line [phone] Conceirge  [email or web address]

## 2023-02-06 DIAGNOSIS — M8008XS Age-related osteoporosis with current pathological fracture, vertebra(e), sequela: Secondary | ICD-10-CM | POA: Diagnosis not present

## 2023-02-06 DIAGNOSIS — E038 Other specified hypothyroidism: Secondary | ICD-10-CM | POA: Diagnosis not present

## 2023-02-06 DIAGNOSIS — K5901 Slow transit constipation: Secondary | ICD-10-CM | POA: Diagnosis not present

## 2023-02-06 DIAGNOSIS — J9611 Chronic respiratory failure with hypoxia: Secondary | ICD-10-CM | POA: Diagnosis not present

## 2023-02-06 DIAGNOSIS — F411 Generalized anxiety disorder: Secondary | ICD-10-CM | POA: Diagnosis not present

## 2023-02-06 DIAGNOSIS — R296 Repeated falls: Secondary | ICD-10-CM | POA: Diagnosis not present

## 2023-02-06 DIAGNOSIS — I82431 Acute embolism and thrombosis of right popliteal vein: Secondary | ICD-10-CM | POA: Diagnosis not present

## 2023-02-06 DIAGNOSIS — R079 Chest pain, unspecified: Secondary | ICD-10-CM | POA: Diagnosis not present

## 2023-02-06 DIAGNOSIS — K219 Gastro-esophageal reflux disease without esophagitis: Secondary | ICD-10-CM | POA: Diagnosis not present

## 2023-02-06 DIAGNOSIS — M62838 Other muscle spasm: Secondary | ICD-10-CM | POA: Diagnosis not present

## 2023-02-06 DIAGNOSIS — M150 Primary generalized (osteo)arthritis: Secondary | ICD-10-CM | POA: Diagnosis not present

## 2023-02-06 DIAGNOSIS — E782 Mixed hyperlipidemia: Secondary | ICD-10-CM | POA: Diagnosis not present

## 2023-02-09 DIAGNOSIS — I82431 Acute embolism and thrombosis of right popliteal vein: Secondary | ICD-10-CM | POA: Diagnosis not present

## 2023-02-09 DIAGNOSIS — L940 Localized scleroderma [morphea]: Secondary | ICD-10-CM | POA: Diagnosis not present

## 2023-02-10 DIAGNOSIS — K219 Gastro-esophageal reflux disease without esophagitis: Secondary | ICD-10-CM | POA: Diagnosis not present

## 2023-02-10 DIAGNOSIS — E782 Mixed hyperlipidemia: Secondary | ICD-10-CM | POA: Diagnosis not present

## 2023-02-10 DIAGNOSIS — E038 Other specified hypothyroidism: Secondary | ICD-10-CM | POA: Diagnosis not present

## 2023-02-10 DIAGNOSIS — I82431 Acute embolism and thrombosis of right popliteal vein: Secondary | ICD-10-CM | POA: Diagnosis not present

## 2023-02-10 DIAGNOSIS — M62838 Other muscle spasm: Secondary | ICD-10-CM | POA: Diagnosis not present

## 2023-02-10 DIAGNOSIS — M8008XS Age-related osteoporosis with current pathological fracture, vertebra(e), sequela: Secondary | ICD-10-CM | POA: Diagnosis not present

## 2023-02-10 DIAGNOSIS — D696 Thrombocytopenia, unspecified: Secondary | ICD-10-CM | POA: Diagnosis not present

## 2023-02-10 DIAGNOSIS — F411 Generalized anxiety disorder: Secondary | ICD-10-CM | POA: Diagnosis not present

## 2023-02-10 DIAGNOSIS — K5901 Slow transit constipation: Secondary | ICD-10-CM | POA: Diagnosis not present

## 2023-02-10 DIAGNOSIS — M150 Primary generalized (osteo)arthritis: Secondary | ICD-10-CM | POA: Diagnosis not present

## 2023-02-10 DIAGNOSIS — U071 COVID-19: Secondary | ICD-10-CM | POA: Diagnosis not present

## 2023-02-10 DIAGNOSIS — J9611 Chronic respiratory failure with hypoxia: Secondary | ICD-10-CM | POA: Diagnosis not present

## 2023-02-13 DIAGNOSIS — S12200D Unspecified displaced fracture of third cervical vertebra, subsequent encounter for fracture with routine healing: Secondary | ICD-10-CM | POA: Diagnosis not present

## 2023-02-13 DIAGNOSIS — R197 Diarrhea, unspecified: Secondary | ICD-10-CM | POA: Diagnosis not present

## 2023-02-13 DIAGNOSIS — S22020D Wedge compression fracture of second thoracic vertebra, subsequent encounter for fracture with routine healing: Secondary | ICD-10-CM | POA: Diagnosis not present

## 2023-02-13 DIAGNOSIS — D508 Other iron deficiency anemias: Secondary | ICD-10-CM | POA: Diagnosis not present

## 2023-02-16 DIAGNOSIS — J9611 Chronic respiratory failure with hypoxia: Secondary | ICD-10-CM | POA: Diagnosis not present

## 2023-02-16 DIAGNOSIS — S22020D Wedge compression fracture of second thoracic vertebra, subsequent encounter for fracture with routine healing: Secondary | ICD-10-CM | POA: Diagnosis not present

## 2023-02-16 DIAGNOSIS — M6281 Muscle weakness (generalized): Secondary | ICD-10-CM | POA: Diagnosis not present

## 2023-02-16 DIAGNOSIS — S12200D Unspecified displaced fracture of third cervical vertebra, subsequent encounter for fracture with routine healing: Secondary | ICD-10-CM | POA: Diagnosis not present

## 2023-02-20 DIAGNOSIS — I5032 Chronic diastolic (congestive) heart failure: Secondary | ICD-10-CM | POA: Diagnosis not present

## 2023-02-20 DIAGNOSIS — F331 Major depressive disorder, recurrent, moderate: Secondary | ICD-10-CM | POA: Diagnosis not present

## 2023-02-20 DIAGNOSIS — L940 Localized scleroderma [morphea]: Secondary | ICD-10-CM | POA: Diagnosis not present

## 2023-02-20 DIAGNOSIS — I82431 Acute embolism and thrombosis of right popliteal vein: Secondary | ICD-10-CM | POA: Diagnosis not present

## 2023-02-20 DIAGNOSIS — J449 Chronic obstructive pulmonary disease, unspecified: Secondary | ICD-10-CM | POA: Diagnosis not present

## 2023-02-20 DIAGNOSIS — F064 Anxiety disorder due to known physiological condition: Secondary | ICD-10-CM | POA: Diagnosis not present

## 2023-02-20 DIAGNOSIS — R600 Localized edema: Secondary | ICD-10-CM | POA: Diagnosis not present

## 2023-02-23 DIAGNOSIS — G4700 Insomnia, unspecified: Secondary | ICD-10-CM | POA: Diagnosis not present

## 2023-02-23 DIAGNOSIS — Z79899 Other long term (current) drug therapy: Secondary | ICD-10-CM | POA: Diagnosis not present

## 2023-02-23 DIAGNOSIS — J309 Allergic rhinitis, unspecified: Secondary | ICD-10-CM | POA: Diagnosis not present

## 2023-02-23 DIAGNOSIS — L299 Pruritus, unspecified: Secondary | ICD-10-CM | POA: Diagnosis not present

## 2023-02-27 DIAGNOSIS — L940 Localized scleroderma [morphea]: Secondary | ICD-10-CM | POA: Diagnosis not present

## 2023-02-27 DIAGNOSIS — I110 Hypertensive heart disease with heart failure: Secondary | ICD-10-CM | POA: Diagnosis not present

## 2023-02-27 DIAGNOSIS — Z79899 Other long term (current) drug therapy: Secondary | ICD-10-CM | POA: Diagnosis not present

## 2023-02-27 DIAGNOSIS — R600 Localized edema: Secondary | ICD-10-CM | POA: Diagnosis not present

## 2023-02-27 DIAGNOSIS — I5032 Chronic diastolic (congestive) heart failure: Secondary | ICD-10-CM | POA: Diagnosis not present

## 2023-03-02 ENCOUNTER — Encounter: Payer: Self-pay | Admitting: Family Medicine

## 2023-03-02 DIAGNOSIS — R600 Localized edema: Secondary | ICD-10-CM | POA: Diagnosis not present

## 2023-03-02 DIAGNOSIS — K219 Gastro-esophageal reflux disease without esophagitis: Secondary | ICD-10-CM | POA: Diagnosis not present

## 2023-03-02 DIAGNOSIS — E669 Obesity, unspecified: Secondary | ICD-10-CM | POA: Diagnosis not present

## 2023-03-02 DIAGNOSIS — N1832 Chronic kidney disease, stage 3b: Secondary | ICD-10-CM | POA: Diagnosis not present

## 2023-03-02 DIAGNOSIS — E785 Hyperlipidemia, unspecified: Secondary | ICD-10-CM | POA: Diagnosis not present

## 2023-03-02 DIAGNOSIS — I10 Essential (primary) hypertension: Secondary | ICD-10-CM | POA: Diagnosis not present

## 2023-03-02 DIAGNOSIS — I5032 Chronic diastolic (congestive) heart failure: Secondary | ICD-10-CM | POA: Diagnosis not present

## 2023-03-02 DIAGNOSIS — G4733 Obstructive sleep apnea (adult) (pediatric): Secondary | ICD-10-CM | POA: Diagnosis not present

## 2023-03-02 DIAGNOSIS — R0602 Shortness of breath: Secondary | ICD-10-CM | POA: Diagnosis not present

## 2023-03-06 DIAGNOSIS — L940 Localized scleroderma [morphea]: Secondary | ICD-10-CM | POA: Diagnosis not present

## 2023-03-06 DIAGNOSIS — F331 Major depressive disorder, recurrent, moderate: Secondary | ICD-10-CM | POA: Diagnosis not present

## 2023-03-06 DIAGNOSIS — G4700 Insomnia, unspecified: Secondary | ICD-10-CM | POA: Diagnosis not present

## 2023-03-06 DIAGNOSIS — I5032 Chronic diastolic (congestive) heart failure: Secondary | ICD-10-CM | POA: Diagnosis not present

## 2023-03-06 DIAGNOSIS — D508 Other iron deficiency anemias: Secondary | ICD-10-CM | POA: Diagnosis not present

## 2023-03-06 DIAGNOSIS — F064 Anxiety disorder due to known physiological condition: Secondary | ICD-10-CM | POA: Diagnosis not present

## 2023-03-13 DIAGNOSIS — M792 Neuralgia and neuritis, unspecified: Secondary | ICD-10-CM | POA: Diagnosis not present

## 2023-03-13 DIAGNOSIS — G8929 Other chronic pain: Secondary | ICD-10-CM | POA: Diagnosis not present

## 2023-03-13 DIAGNOSIS — Z79891 Long term (current) use of opiate analgesic: Secondary | ICD-10-CM | POA: Diagnosis not present

## 2023-03-13 DIAGNOSIS — M150 Primary generalized (osteo)arthritis: Secondary | ICD-10-CM | POA: Diagnosis not present

## 2023-03-17 DIAGNOSIS — K5900 Constipation, unspecified: Secondary | ICD-10-CM | POA: Diagnosis not present

## 2023-03-17 DIAGNOSIS — J9611 Chronic respiratory failure with hypoxia: Secondary | ICD-10-CM | POA: Diagnosis not present

## 2023-03-17 DIAGNOSIS — I5032 Chronic diastolic (congestive) heart failure: Secondary | ICD-10-CM | POA: Diagnosis not present

## 2023-03-17 DIAGNOSIS — K219 Gastro-esophageal reflux disease without esophagitis: Secondary | ICD-10-CM | POA: Diagnosis not present

## 2023-03-17 DIAGNOSIS — N183 Chronic kidney disease, stage 3 unspecified: Secondary | ICD-10-CM | POA: Diagnosis not present

## 2023-03-17 DIAGNOSIS — M6281 Muscle weakness (generalized): Secondary | ICD-10-CM | POA: Diagnosis not present

## 2023-03-24 DIAGNOSIS — B351 Tinea unguium: Secondary | ICD-10-CM | POA: Diagnosis not present

## 2023-03-24 DIAGNOSIS — I739 Peripheral vascular disease, unspecified: Secondary | ICD-10-CM | POA: Diagnosis not present

## 2023-03-24 DIAGNOSIS — L603 Nail dystrophy: Secondary | ICD-10-CM | POA: Diagnosis not present

## 2023-03-24 DIAGNOSIS — M2041 Other hammer toe(s) (acquired), right foot: Secondary | ICD-10-CM | POA: Diagnosis not present

## 2023-03-24 DIAGNOSIS — M2042 Other hammer toe(s) (acquired), left foot: Secondary | ICD-10-CM | POA: Diagnosis not present

## 2023-04-01 DIAGNOSIS — S31819A Unspecified open wound of right buttock, initial encounter: Secondary | ICD-10-CM | POA: Diagnosis not present

## 2023-04-03 DIAGNOSIS — K5900 Constipation, unspecified: Secondary | ICD-10-CM | POA: Diagnosis not present

## 2023-04-03 DIAGNOSIS — J309 Allergic rhinitis, unspecified: Secondary | ICD-10-CM | POA: Diagnosis not present

## 2023-04-03 DIAGNOSIS — J9611 Chronic respiratory failure with hypoxia: Secondary | ICD-10-CM | POA: Diagnosis not present

## 2023-04-14 DIAGNOSIS — N183 Chronic kidney disease, stage 3 unspecified: Secondary | ICD-10-CM | POA: Diagnosis not present

## 2023-04-14 DIAGNOSIS — J9611 Chronic respiratory failure with hypoxia: Secondary | ICD-10-CM | POA: Diagnosis not present

## 2023-04-14 DIAGNOSIS — M6281 Muscle weakness (generalized): Secondary | ICD-10-CM | POA: Diagnosis not present

## 2023-04-14 DIAGNOSIS — K5900 Constipation, unspecified: Secondary | ICD-10-CM | POA: Diagnosis not present

## 2023-04-14 DIAGNOSIS — E039 Hypothyroidism, unspecified: Secondary | ICD-10-CM | POA: Diagnosis not present

## 2023-04-14 DIAGNOSIS — I5032 Chronic diastolic (congestive) heart failure: Secondary | ICD-10-CM | POA: Diagnosis not present

## 2023-04-17 DIAGNOSIS — F331 Major depressive disorder, recurrent, moderate: Secondary | ICD-10-CM | POA: Diagnosis not present

## 2023-04-17 DIAGNOSIS — M150 Primary generalized (osteo)arthritis: Secondary | ICD-10-CM | POA: Diagnosis not present

## 2023-04-17 DIAGNOSIS — F064 Anxiety disorder due to known physiological condition: Secondary | ICD-10-CM | POA: Diagnosis not present

## 2023-04-17 DIAGNOSIS — M6281 Muscle weakness (generalized): Secondary | ICD-10-CM | POA: Diagnosis not present

## 2023-04-17 DIAGNOSIS — G8929 Other chronic pain: Secondary | ICD-10-CM | POA: Diagnosis not present

## 2023-04-17 DIAGNOSIS — L940 Localized scleroderma [morphea]: Secondary | ICD-10-CM | POA: Diagnosis not present

## 2023-04-21 DIAGNOSIS — Z79899 Other long term (current) drug therapy: Secondary | ICD-10-CM | POA: Diagnosis not present

## 2023-04-27 DIAGNOSIS — N183 Chronic kidney disease, stage 3 unspecified: Secondary | ICD-10-CM | POA: Diagnosis not present

## 2023-04-27 DIAGNOSIS — J9611 Chronic respiratory failure with hypoxia: Secondary | ICD-10-CM | POA: Diagnosis not present

## 2023-04-27 DIAGNOSIS — K5900 Constipation, unspecified: Secondary | ICD-10-CM | POA: Diagnosis not present

## 2023-04-27 DIAGNOSIS — E039 Hypothyroidism, unspecified: Secondary | ICD-10-CM | POA: Diagnosis not present

## 2023-04-27 DIAGNOSIS — Z0000 Encounter for general adult medical examination without abnormal findings: Secondary | ICD-10-CM | POA: Diagnosis not present

## 2023-04-27 DIAGNOSIS — K219 Gastro-esophageal reflux disease without esophagitis: Secondary | ICD-10-CM | POA: Diagnosis not present

## 2023-05-04 DIAGNOSIS — F411 Generalized anxiety disorder: Secondary | ICD-10-CM | POA: Diagnosis not present

## 2023-05-04 DIAGNOSIS — L89321 Pressure ulcer of left buttock, stage 1: Secondary | ICD-10-CM | POA: Diagnosis not present

## 2023-05-04 DIAGNOSIS — J9611 Chronic respiratory failure with hypoxia: Secondary | ICD-10-CM | POA: Diagnosis not present

## 2023-05-04 DIAGNOSIS — G43909 Migraine, unspecified, not intractable, without status migrainosus: Secondary | ICD-10-CM | POA: Diagnosis not present

## 2023-05-04 DIAGNOSIS — Z9981 Dependence on supplemental oxygen: Secondary | ICD-10-CM | POA: Diagnosis not present

## 2023-05-08 DIAGNOSIS — F331 Major depressive disorder, recurrent, moderate: Secondary | ICD-10-CM | POA: Diagnosis not present

## 2023-05-08 DIAGNOSIS — F410 Panic disorder [episodic paroxysmal anxiety] without agoraphobia: Secondary | ICD-10-CM | POA: Diagnosis not present

## 2023-05-08 DIAGNOSIS — F064 Anxiety disorder due to known physiological condition: Secondary | ICD-10-CM | POA: Diagnosis not present

## 2023-05-08 DIAGNOSIS — L940 Localized scleroderma [morphea]: Secondary | ICD-10-CM | POA: Diagnosis not present

## 2023-05-12 DIAGNOSIS — N183 Chronic kidney disease, stage 3 unspecified: Secondary | ICD-10-CM | POA: Diagnosis not present

## 2023-05-12 DIAGNOSIS — E785 Hyperlipidemia, unspecified: Secondary | ICD-10-CM | POA: Diagnosis not present

## 2023-05-12 DIAGNOSIS — K219 Gastro-esophageal reflux disease without esophagitis: Secondary | ICD-10-CM | POA: Diagnosis not present

## 2023-05-12 DIAGNOSIS — I5032 Chronic diastolic (congestive) heart failure: Secondary | ICD-10-CM | POA: Diagnosis not present

## 2023-05-12 DIAGNOSIS — M6281 Muscle weakness (generalized): Secondary | ICD-10-CM | POA: Diagnosis not present

## 2023-05-13 DIAGNOSIS — F410 Panic disorder [episodic paroxysmal anxiety] without agoraphobia: Secondary | ICD-10-CM | POA: Diagnosis not present

## 2023-05-13 DIAGNOSIS — F331 Major depressive disorder, recurrent, moderate: Secondary | ICD-10-CM | POA: Diagnosis not present

## 2023-05-13 DIAGNOSIS — I10 Essential (primary) hypertension: Secondary | ICD-10-CM | POA: Diagnosis not present

## 2023-05-13 DIAGNOSIS — F064 Anxiety disorder due to known physiological condition: Secondary | ICD-10-CM | POA: Diagnosis not present

## 2023-05-13 DIAGNOSIS — L940 Localized scleroderma [morphea]: Secondary | ICD-10-CM | POA: Diagnosis not present

## 2023-05-15 DIAGNOSIS — M542 Cervicalgia: Secondary | ICD-10-CM | POA: Diagnosis not present

## 2023-05-15 DIAGNOSIS — M792 Neuralgia and neuritis, unspecified: Secondary | ICD-10-CM | POA: Diagnosis not present

## 2023-05-15 DIAGNOSIS — M150 Primary generalized (osteo)arthritis: Secondary | ICD-10-CM | POA: Diagnosis not present

## 2023-05-15 DIAGNOSIS — G894 Chronic pain syndrome: Secondary | ICD-10-CM | POA: Diagnosis not present

## 2023-05-26 ENCOUNTER — Ambulatory Visit: Payer: Medicare Other | Admitting: Family Medicine

## 2023-06-05 DIAGNOSIS — D509 Iron deficiency anemia, unspecified: Secondary | ICD-10-CM | POA: Diagnosis not present

## 2023-06-05 DIAGNOSIS — K219 Gastro-esophageal reflux disease without esophagitis: Secondary | ICD-10-CM | POA: Diagnosis not present

## 2023-06-05 DIAGNOSIS — F411 Generalized anxiety disorder: Secondary | ICD-10-CM | POA: Diagnosis not present

## 2023-06-05 DIAGNOSIS — I129 Hypertensive chronic kidney disease with stage 1 through stage 4 chronic kidney disease, or unspecified chronic kidney disease: Secondary | ICD-10-CM | POA: Diagnosis not present

## 2023-06-05 DIAGNOSIS — K5909 Other constipation: Secondary | ICD-10-CM | POA: Diagnosis not present

## 2023-06-05 DIAGNOSIS — E039 Hypothyroidism, unspecified: Secondary | ICD-10-CM | POA: Diagnosis not present

## 2023-06-05 DIAGNOSIS — N1831 Chronic kidney disease, stage 3a: Secondary | ICD-10-CM | POA: Diagnosis not present

## 2023-06-05 DIAGNOSIS — G43009 Migraine without aura, not intractable, without status migrainosus: Secondary | ICD-10-CM | POA: Diagnosis not present

## 2023-09-07 ENCOUNTER — Other Ambulatory Visit: Payer: Self-pay | Admitting: Family Medicine

## 2023-09-08 NOTE — Telephone Encounter (Signed)
Requested Prescriptions  Pending Prescriptions Disp Refills   omeprazole (PRILOSEC) 20 MG capsule [Pharmacy Med Name: OMEPRAZOLE 20MG  CAPSULES] 90 capsule 1    Sig: TAKE 1 CAPSULE(20 MG) BY MOUTH DAILY     Gastroenterology: Proton Pump Inhibitors Passed - 09/07/2023  6:21 AM      Passed - Valid encounter within last 12 months    Recent Outpatient Visits           8 months ago Chronic heart failure with preserved ejection fraction [facility])   [facility] [place], Marzella [place], MD   9 months ago Essential (primary) hypertension   [facility] [place], Marzella [place], MD   1 year ago Chronic heart failure with preserved ejection fraction [facility])   [facility] Merita Norton T, FNP   1 year ago Bilateral calf pain   [facility] [place], Marzella [place], MD   1 year ago Acute on chronic heart failure with preserved ejection fraction [facility])   [facility], Marzella [place], MD

## 2023-12-07 ENCOUNTER — Inpatient Hospital Stay: Payer: Medicare (Managed Care)

## 2023-12-14 ENCOUNTER — Inpatient Hospital Stay: Payer: Medicare (Managed Care) | Admitting: Oncology

## 2024-09-09 ENCOUNTER — Emergency Department: Payer: Medicare (Managed Care)

## 2024-09-09 ENCOUNTER — Emergency Department
Admission: EM | Admit: 2024-09-09 | Discharge: 2024-09-09 | Disposition: A | Discharge status: Skilled Nursing Facility | Payer: Medicare (Managed Care) | Attending: Emergency Medicine | Admitting: Emergency Medicine

## 2024-09-09 ENCOUNTER — Other Ambulatory Visit: Payer: Self-pay

## 2024-09-09 DIAGNOSIS — W01198A Fall on same level from slipping, tripping and stumbling with subsequent striking against other object, initial encounter: Secondary | ICD-10-CM | POA: Insufficient documentation

## 2024-09-09 DIAGNOSIS — W19XXXA Unspecified fall, initial encounter: Secondary | ICD-10-CM

## 2024-09-09 DIAGNOSIS — S0101XA Laceration without foreign body of scalp, initial encounter: Secondary | ICD-10-CM | POA: Diagnosis present

## 2024-09-09 DIAGNOSIS — Z8673 Personal history of transient ischemic attack (TIA), and cerebral infarction without residual deficits: Secondary | ICD-10-CM | POA: Insufficient documentation

## 2024-09-09 DIAGNOSIS — I509 Heart failure, unspecified: Secondary | ICD-10-CM | POA: Insufficient documentation

## 2024-09-09 DIAGNOSIS — N189 Chronic kidney disease, unspecified: Secondary | ICD-10-CM | POA: Insufficient documentation

## 2024-09-09 DIAGNOSIS — I130 Hypertensive heart and chronic kidney disease with heart failure and stage 1 through stage 4 chronic kidney disease, or unspecified chronic kidney disease: Secondary | ICD-10-CM | POA: Insufficient documentation

## 2024-09-09 HISTORY — DX: Unspecified osteoarthritis, unspecified site: M19.90

## 2024-09-09 MED ORDER — LIDOCAINE-EPINEPHRINE 2 %-1:100000 IJ SOLN
20.0000 mL | Freq: Once | INTRAMUSCULAR | Status: AC
  Administered 2024-09-09: 20 mL
  Filled 2024-09-09: qty 1

## 2024-09-09 NOTE — ED Provider Notes (Signed)
 [facility] Provider Note    Event Date/Time   First MD Initiated Contact with Patient 09/09/24 2155     (approximate)   History   Chief Complaint Fall   HPI  Helen Ramsey is a 89 y.o. female with past medical history of hypertension, stroke, CHF, chronic hypoxic respiratory failure on 2 L, and CKD who presents to the ED complaining of fall.  Patient reports that she was reaching for something on a shelf in her room when she lost her balance and fell backwards, striking her head.  She believes she struck the back of her head on a bedpost, and noticed significant bleeding from the back of her head following the fall.  She does report some neck pain and was placed in a c-collar prior to arrival.  She denies any pain in her chest, abdomen, or extremities.  She does not take a blood thinner beyond a daily baby aspirin  and is unsure of her last tetanus shot.     Physical Exam   Triage Vital Signs: ED Triage Vitals  Encounter Vitals Group     BP 09/09/24 2157 (!) 143/102     Girls Systolic BP Percentile --      Girls Diastolic BP Percentile --      Boys Systolic BP Percentile --      Boys Diastolic BP Percentile --      Pulse Rate 09/09/24 2157 86     Resp 09/09/24 2157 18     Temp 09/09/24 2157 100.1 F (37.8 C)     Temp Source 09/09/24 2157 Oral     SpO2 09/09/24 2157 99 %     Weight --      Height --      Head Circumference --      Peak Flow --      Pain Score 09/09/24 2159 10     Pain Loc --      Pain Education --      Exclude from Growth Chart --     Most recent vital signs: Vitals:   09/09/24 2157  BP: (!) 143/102  Pulse: 86  Resp: 18  Temp: 100.1 F (37.8 C)  SpO2: 99%    Constitutional: Alert and oriented. Eyes: Conjunctivae are normal. Head: Atraumatic. Nose: No congestion/rhinnorhea. Mouth/Throat: Mucous membranes are moist.  Neck: Cervical collar in place, no midline tenderness. Cardiovascular: Normal rate, regular  rhythm. Grossly normal heart sounds.  2+ radial pulses bilaterally. Respiratory: Normal respiratory effort.  No retractions. Lungs CTAB.  No chest wall tenderness to palpation. Gastrointestinal: Soft and nontender. No distention. Musculoskeletal: No lower extremity tenderness nor edema.  No upper extremity bony tenderness. Neurologic:  Normal speech and language. No gross focal neurologic deficits are appreciated.    ED Results / Procedures / Treatments   Labs (all labs ordered are listed, but only abnormal results are displayed) Labs Reviewed - No data to display   RADIOLOGY CT head reviewed and interpreted by me with no hemorrhage or midline shift.  PROCEDURES:  Critical Care performed: No  .Laceration Repair  Date/Time: 09/09/2024 11:27 PM  Performed by: Willo Dunnings, MD Authorized by: Willo Dunnings, MD   Consent:    Consent obtained:  Verbal   Consent given by:  Patient   Risks, benefits, and alternatives were discussed: yes   Universal protocol:    Patient identity confirmed:  Arm band and verbally with patient Anesthesia:    Anesthesia method:  Local infiltration  Local anesthetic:  Lidocaine  2% WITH epi Laceration details:    Location:  Scalp   Scalp location:  L parietal   Length (cm):  8 Pre-procedure details:    Preparation:  Patient was prepped and draped in usual sterile fashion and imaging obtained to evaluate for foreign bodies Exploration:    Limited defect created (wound extended): no     Hemostasis achieved with:  Direct pressure   Imaging obtained comment:  CT   Imaging outcome: foreign body not noted     Wound exploration: wound explored through full range of motion and entire depth of wound visualized     Wound extent: areolar tissue not violated, fascia not violated, no foreign body, no signs of injury, no nerve damage, no tendon damage, no underlying fracture and no vascular damage     Contaminated: no   Treatment:    Area cleansed with:   Saline   Amount of cleaning:  Standard   Irrigation solution:  Sterile saline   Irrigation method:  Pressure wash   Debridement:  None   Undermining:  None   Scar revision: no   Skin repair:    Repair method:  Staples   Number of staples:  9 Approximation:    Approximation:  Loose Repair type:    Repair type:  Simple Post-procedure details:    Dressing:  Open (no dressing)   Procedure completion:  Tolerated well, no immediate complications    MEDICATIONS ORDERED IN ED: Medications  lidocaine -EPINEPHrine  (XYLOCAINE  W/EPI) 2 %-1:100000 (with pres) injection 20 mL (20 mLs Infiltration Given 09/09/24 2244)     IMPRESSION / MDM / ASSESSMENT AND PLAN / ED COURSE  I reviewed the triage vital signs and the nursing notes.                              89 y.o. female with past medical history of hypertension, stroke, CHF, CKD, and chronic hypoxic respiratory failure on 2 L who presents to the ED after losing her balance and falling backwards, striking her head.  Patient's presentation is most consistent with acute complicated illness / injury requiring diagnostic workup.  Differential diagnosis includes, but is not limited to, intracranial injury, skull fracture, cervical spine fracture, laceration.  Patient well-appearing and in no acute distress, vital signs are remarkable for borderline fever but otherwise reassuring.  No other findings concerning for sepsis and patient denies any symptoms concerning for infectious process.  CT head and cervical spine are negative for acute finding, do show compression fracture at T2, which patient was previously aware with no significant symptoms.  No evidence of traumatic injury to her trunk lower extremities.  Her tetanus is up to date, laceration was repaired with staples following irrigation with saline.  She is appropriate for discharge home with staple removal in 1 week, was counseled to return to the ED for new or worsening symptoms.  Patient  and son agree with plan.      FINAL CLINICAL IMPRESSION(S) / ED DIAGNOSES   Final diagnoses:  Fall, initial encounter  Laceration of scalp, initial encounter     Rx / DC Orders   ED Discharge Orders     None        Note:  This document was prepared using Dragon voice recognition software and may include unintentional dictation errors.   Willo Dunnings, MD 09/09/24 2337

## 2024-09-09 NOTE — ED Triage Notes (Signed)
 Pt coming from facility via EMS. Pt fell when bending over, lost balance, hit back of head on corner of bed. No LOC. C/O of neck stiffness. Hx of C1 - C3 surgery 8 months to year ago. Laceration to top of head. Bleeding controlled. Pt takes no blood thinners, only asprin daily.   Per EMS pt showed AFIB on EKG  EMS vitals BP 195/65 Spo2 98% on 2 L HR 82

## 2024-11-09 ENCOUNTER — Other Ambulatory Visit: Payer: Self-pay

## 2024-11-09 ENCOUNTER — Inpatient Hospital Stay
Admission: EM | Admit: 2024-11-09 | Discharge: 2024-11-14 | DRG: 871 | Disposition: A | Discharge status: Skilled Nursing Facility | Payer: Medicare (Managed Care) | Source: Skilled Nursing Facility

## 2024-11-09 ENCOUNTER — Inpatient Hospital Stay: Payer: Medicare (Managed Care)

## 2024-11-09 ENCOUNTER — Emergency Department: Payer: Medicare (Managed Care)

## 2024-11-09 ENCOUNTER — Ambulatory Visit: Payer: Self-pay

## 2024-11-09 DIAGNOSIS — N3289 Other specified disorders of bladder: Secondary | ICD-10-CM

## 2024-11-09 DIAGNOSIS — N1832 Chronic kidney disease, stage 3b: Secondary | ICD-10-CM | POA: Diagnosis present

## 2024-11-09 DIAGNOSIS — E872 Acidosis, unspecified: Secondary | ICD-10-CM | POA: Diagnosis present

## 2024-11-09 DIAGNOSIS — Z9842 Cataract extraction status, left eye: Secondary | ICD-10-CM

## 2024-11-09 DIAGNOSIS — J45901 Unspecified asthma with (acute) exacerbation: Secondary | ICD-10-CM | POA: Diagnosis present

## 2024-11-09 DIAGNOSIS — Z961 Presence of intraocular lens: Secondary | ICD-10-CM | POA: Diagnosis present

## 2024-11-09 DIAGNOSIS — K219 Gastro-esophageal reflux disease without esophagitis: Secondary | ICD-10-CM | POA: Diagnosis present

## 2024-11-09 DIAGNOSIS — A419 Sepsis, unspecified organism: Secondary | ICD-10-CM | POA: Diagnosis present

## 2024-11-09 DIAGNOSIS — Z1152 Encounter for screening for COVID-19: Secondary | ICD-10-CM | POA: Diagnosis not present

## 2024-11-09 DIAGNOSIS — J440 Chronic obstructive pulmonary disease with acute lower respiratory infection: Secondary | ICD-10-CM | POA: Diagnosis present

## 2024-11-09 DIAGNOSIS — Z79890 Hormone replacement therapy (postmenopausal): Secondary | ICD-10-CM

## 2024-11-09 DIAGNOSIS — Z66 Do not resuscitate: Secondary | ICD-10-CM | POA: Diagnosis present

## 2024-11-09 DIAGNOSIS — E66811 Obesity, class 1: Secondary | ICD-10-CM | POA: Diagnosis present

## 2024-11-09 DIAGNOSIS — J441 Chronic obstructive pulmonary disease with (acute) exacerbation: Secondary | ICD-10-CM | POA: Diagnosis present

## 2024-11-09 DIAGNOSIS — F419 Anxiety disorder, unspecified: Secondary | ICD-10-CM | POA: Diagnosis present

## 2024-11-09 DIAGNOSIS — K5900 Constipation, unspecified: Secondary | ICD-10-CM | POA: Diagnosis not present

## 2024-11-09 DIAGNOSIS — J189 Pneumonia, unspecified organism: Secondary | ICD-10-CM | POA: Diagnosis present

## 2024-11-09 DIAGNOSIS — J9601 Acute respiratory failure with hypoxia: Principal | ICD-10-CM

## 2024-11-09 DIAGNOSIS — J9621 Acute and chronic respiratory failure with hypoxia: Secondary | ICD-10-CM | POA: Diagnosis present

## 2024-11-09 DIAGNOSIS — I5033 Acute on chronic diastolic (congestive) heart failure: Secondary | ICD-10-CM | POA: Diagnosis not present

## 2024-11-09 DIAGNOSIS — Z6832 Body mass index (BMI) 32.0-32.9, adult: Secondary | ICD-10-CM

## 2024-11-09 DIAGNOSIS — M1990 Unspecified osteoarthritis, unspecified site: Secondary | ICD-10-CM

## 2024-11-09 DIAGNOSIS — E785 Hyperlipidemia, unspecified: Secondary | ICD-10-CM | POA: Diagnosis present

## 2024-11-09 DIAGNOSIS — Z86718 Personal history of other venous thrombosis and embolism: Secondary | ICD-10-CM

## 2024-11-09 DIAGNOSIS — E876 Hypokalemia: Secondary | ICD-10-CM | POA: Diagnosis present

## 2024-11-09 DIAGNOSIS — Z91148 Patient's other noncompliance with medication regimen for other reason: Secondary | ICD-10-CM

## 2024-11-09 DIAGNOSIS — B974 Respiratory syncytial virus as the cause of diseases classified elsewhere: Secondary | ICD-10-CM | POA: Diagnosis present

## 2024-11-09 DIAGNOSIS — K760 Fatty (change of) liver, not elsewhere classified: Secondary | ICD-10-CM | POA: Diagnosis present

## 2024-11-09 DIAGNOSIS — I350 Nonrheumatic aortic (valve) stenosis: Secondary | ICD-10-CM | POA: Diagnosis present

## 2024-11-09 DIAGNOSIS — I130 Hypertensive heart and chronic kidney disease with heart failure and stage 1 through stage 4 chronic kidney disease, or unspecified chronic kidney disease: Secondary | ICD-10-CM | POA: Diagnosis present

## 2024-11-09 DIAGNOSIS — F32A Depression, unspecified: Secondary | ICD-10-CM | POA: Diagnosis present

## 2024-11-09 DIAGNOSIS — J324 Chronic pansinusitis: Secondary | ICD-10-CM | POA: Diagnosis present

## 2024-11-09 DIAGNOSIS — Z96651 Presence of right artificial knee joint: Secondary | ICD-10-CM | POA: Diagnosis present

## 2024-11-09 DIAGNOSIS — M069 Rheumatoid arthritis, unspecified: Secondary | ICD-10-CM | POA: Diagnosis present

## 2024-11-09 DIAGNOSIS — I5023 Acute on chronic systolic (congestive) heart failure: Secondary | ICD-10-CM | POA: Diagnosis not present

## 2024-11-09 DIAGNOSIS — Z9981 Dependence on supplemental oxygen: Secondary | ICD-10-CM

## 2024-11-09 DIAGNOSIS — R6520 Severe sepsis without septic shock: Secondary | ICD-10-CM | POA: Diagnosis present

## 2024-11-09 DIAGNOSIS — Z79899 Other long term (current) drug therapy: Secondary | ICD-10-CM

## 2024-11-09 DIAGNOSIS — Z886 Allergy status to analgesic agent status: Secondary | ICD-10-CM

## 2024-11-09 DIAGNOSIS — Z9841 Cataract extraction status, right eye: Secondary | ICD-10-CM

## 2024-11-09 DIAGNOSIS — Z880 Allergy status to penicillin: Secondary | ICD-10-CM

## 2024-11-09 DIAGNOSIS — Z888 Allergy status to other drugs, medicaments and biological substances status: Secondary | ICD-10-CM

## 2024-11-09 DIAGNOSIS — I214 Non-ST elevation (NSTEMI) myocardial infarction: Secondary | ICD-10-CM | POA: Diagnosis present

## 2024-11-09 DIAGNOSIS — A4102 Sepsis due to Methicillin resistant Staphylococcus aureus: Principal | ICD-10-CM | POA: Diagnosis present

## 2024-11-09 DIAGNOSIS — Z885 Allergy status to narcotic agent status: Secondary | ICD-10-CM

## 2024-11-09 DIAGNOSIS — Z79631 Long term (current) use of antimetabolite agent: Secondary | ICD-10-CM

## 2024-11-09 DIAGNOSIS — Z7901 Long term (current) use of anticoagulants: Secondary | ICD-10-CM | POA: Diagnosis not present

## 2024-11-09 DIAGNOSIS — Z8249 Family history of ischemic heart disease and other diseases of the circulatory system: Secondary | ICD-10-CM

## 2024-11-09 DIAGNOSIS — I509 Heart failure, unspecified: Secondary | ICD-10-CM

## 2024-11-09 LAB — COMPREHENSIVE METABOLIC PANEL WITH GFR
ALT: 116 U/L — ABNORMAL HIGH (ref 0–44)
AST: 103 U/L — ABNORMAL HIGH (ref 15–41)
Albumin: 4.1 g/dL (ref 3.5–5.0)
Alkaline Phosphatase: 132 U/L — ABNORMAL HIGH (ref 38–126)
Anion gap: 14 (ref 5–15)
BUN: 36 mg/dL — ABNORMAL HIGH (ref 8–23)
CO2: 26 mmol/L (ref 22–32)
Calcium: 9.6 mg/dL (ref 8.9–10.3)
Chloride: 98 mmol/L (ref 98–111)
Creatinine, Ser: 1.1 mg/dL — ABNORMAL HIGH (ref 0.44–1.00)
GFR, Estimated: 48 mL/min — ABNORMAL LOW
Glucose, Bld: 117 mg/dL — ABNORMAL HIGH (ref 70–99)
Potassium: 3.9 mmol/L (ref 3.5–5.1)
Sodium: 138 mmol/L (ref 135–145)
Total Bilirubin: 0.4 mg/dL (ref 0.0–1.2)
Total Protein: 7.9 g/dL (ref 6.5–8.1)

## 2024-11-09 LAB — BLOOD GAS, VENOUS
Acid-Base Excess: 3.8 mmol/L — ABNORMAL HIGH (ref 0.0–2.0)
Bicarbonate: 29.2 mmol/L — ABNORMAL HIGH (ref 20.0–28.0)
O2 Saturation: 96.1 %
Patient temperature: 37
pCO2, Ven: 46 mmHg (ref 44–60)
pH, Ven: 7.41 (ref 7.25–7.43)
pO2, Ven: 69 mmHg — ABNORMAL HIGH (ref 32–45)

## 2024-11-09 LAB — CBC WITH DIFFERENTIAL/PLATELET
Abs Immature Granulocytes: 0.06 K/uL (ref 0.00–0.07)
Basophils Absolute: 0 K/uL (ref 0.0–0.1)
Basophils Relative: 0 %
Eosinophils Absolute: 0 K/uL (ref 0.0–0.5)
Eosinophils Relative: 0 %
HCT: 38.5 % (ref 36.0–46.0)
Hemoglobin: 12.7 g/dL (ref 12.0–15.0)
Immature Granulocytes: 0 %
Lymphocytes Relative: 7 %
Lymphs Abs: 0.9 K/uL (ref 0.7–4.0)
MCH: 29.3 pg (ref 26.0–34.0)
MCHC: 33 g/dL (ref 30.0–36.0)
MCV: 88.7 fL (ref 80.0–100.0)
Monocytes Absolute: 0.5 K/uL (ref 0.1–1.0)
Monocytes Relative: 4 %
Neutro Abs: 12.2 K/uL — ABNORMAL HIGH (ref 1.7–7.7)
Neutrophils Relative %: 89 %
Platelets: 232 K/uL (ref 150–400)
RBC: 4.34 MIL/uL (ref 3.87–5.11)
RDW: 15.1 % (ref 11.5–15.5)
WBC: 13.7 K/uL — ABNORMAL HIGH (ref 4.0–10.5)
nRBC: 0 % (ref 0.0–0.2)

## 2024-11-09 LAB — URINALYSIS, W/ REFLEX TO CULTURE (INFECTION SUSPECTED)
Bilirubin Urine: NEGATIVE
Glucose, UA: NEGATIVE mg/dL
Hgb urine dipstick: NEGATIVE
Ketones, ur: NEGATIVE mg/dL
Leukocytes,Ua: NEGATIVE
Nitrite: NEGATIVE
Protein, ur: 100 mg/dL — AB
Specific Gravity, Urine: 1.008 (ref 1.005–1.030)
pH: 6 (ref 5.0–8.0)

## 2024-11-09 LAB — HEPARIN LEVEL (UNFRACTIONATED)
Heparin Unfractionated: <0.1 [IU]/mL — ABNORMAL LOW (ref 0.30–0.70)
Heparin Unfractionated: <0.1 [IU]/mL — ABNORMAL LOW (ref 0.30–0.70)

## 2024-11-09 LAB — PROCALCITONIN: Procalcitonin: 0.3 ng/mL

## 2024-11-09 LAB — LIPID PANEL
Cholesterol: 196 mg/dL (ref 0–200)
HDL: 43 mg/dL
LDL Cholesterol: 123 mg/dL — ABNORMAL HIGH (ref 0–99)
Total CHOL/HDL Ratio: 4.5 ratio
Triglycerides: 147 mg/dL
VLDL: 29 mg/dL (ref 0–40)

## 2024-11-09 LAB — LACTIC ACID, PLASMA
Lactic Acid, Venous: 2.1 mmol/L (ref 0.5–1.9)
Lactic Acid, Venous: 2.5 mmol/L (ref 0.5–1.9)

## 2024-11-09 LAB — RESP PANEL BY RT-PCR (RSV, FLU A&B, COVID)  RVPGX2
Influenza A by PCR: NEGATIVE
Influenza B by PCR: NEGATIVE
Resp Syncytial Virus by PCR: NEGATIVE
SARS Coronavirus 2 by RT PCR: NEGATIVE

## 2024-11-09 LAB — TROPONIN T, HIGH SENSITIVITY
Troponin T High Sensitivity: 57 ng/L — ABNORMAL HIGH (ref 0–19)
Troponin T High Sensitivity: 314 ng/L (ref 0–19)
Troponin T High Sensitivity: 496 ng/L (ref 0–19)

## 2024-11-09 LAB — PROTIME-INR
INR: 1 (ref 0.8–1.2)
Prothrombin Time: 13.9 s (ref 11.4–15.2)

## 2024-11-09 LAB — APTT: aPTT: 33 s (ref 24–36)

## 2024-11-09 LAB — PRO BRAIN NATRIURETIC PEPTIDE: Pro Brain Natriuretic Peptide: 808 pg/mL — ABNORMAL HIGH

## 2024-11-09 MED ORDER — VANCOMYCIN HCL IN DEXTROSE 1-5 GM/200ML-% IV SOLN
1000.0000 mg | Freq: Once | INTRAVENOUS | Status: AC
  Administered 2024-11-09: 1000 mg via INTRAVENOUS
  Filled 2024-11-09: qty 200

## 2024-11-09 MED ORDER — MELATONIN 5 MG PO TABS
5.0000 mg | ORAL_TABLET | Freq: Every evening | ORAL | Status: DC | PRN
  Administered 2024-11-11 – 2024-11-13 (×2): 5 mg via ORAL
  Filled 2024-11-09 (×2): qty 1

## 2024-11-09 MED ORDER — HEPARIN BOLUS VIA INFUSION
1800.0000 [IU] | Freq: Once | INTRAVENOUS | Status: AC
  Administered 2024-11-09: 1800 [IU] via INTRAVENOUS
  Filled 2024-11-09: qty 1800

## 2024-11-09 MED ORDER — SODIUM CHLORIDE 0.9 % IV SOLN
2.0000 g | INTRAVENOUS | Status: AC
  Administered 2024-11-10 – 2024-11-13 (×4): 2 g via INTRAVENOUS
  Filled 2024-11-09 (×4): qty 20

## 2024-11-09 MED ORDER — POLYETHYLENE GLYCOL 3350 17 GM/SCOOP PO POWD
17.0000 g | Freq: Every day | ORAL | Status: DC
  Filled 2024-11-09: qty 119

## 2024-11-09 MED ORDER — ONDANSETRON HCL 4 MG/2ML IJ SOLN: 4.0000 mg | Freq: Four times a day (QID) | INTRAMUSCULAR | Status: DC | PRN

## 2024-11-09 MED ORDER — IPRATROPIUM-ALBUTEROL 0.5-2.5 (3) MG/3ML IN SOLN: 3.0000 mL | RESPIRATORY_TRACT | Status: DC | PRN

## 2024-11-09 MED ORDER — POLYETHYLENE GLYCOL 3350 17 G PO PACK
17.0000 g | PACK | Freq: Every day | ORAL | Status: DC
  Administered 2024-11-10 – 2024-11-14 (×4): 17 g via ORAL
  Filled 2024-11-09 (×4): qty 1

## 2024-11-09 MED ORDER — IPRATROPIUM-ALBUTEROL 0.5-2.5 (3) MG/3ML IN SOLN
3.0000 mL | Freq: Two times a day (BID) | RESPIRATORY_TRACT | Status: DC
  Administered 2024-11-10: 3 mL via RESPIRATORY_TRACT
  Filled 2024-11-09: qty 3

## 2024-11-09 MED ORDER — SENNA 8.6 MG PO TABS
1.0000 | ORAL_TABLET | Freq: Every day | ORAL | Status: DC
  Administered 2024-11-09 – 2024-11-14 (×5): 8.6 mg via ORAL
  Filled 2024-11-09 (×5): qty 1

## 2024-11-09 MED ORDER — LINACLOTIDE 145 MCG PO CAPS
145.0000 ug | ORAL_CAPSULE | Freq: Every day | ORAL | Status: DC
  Administered 2024-11-09 – 2024-11-14 (×6): 145 ug via ORAL
  Filled 2024-11-09 (×6): qty 1

## 2024-11-09 MED ORDER — ASPIRIN 300 MG RE SUPP
300.0000 mg | Freq: Once | RECTAL | Status: AC
  Administered 2024-11-09: 300 mg via RECTAL
  Filled 2024-11-09: qty 1

## 2024-11-09 MED ORDER — ASPIRIN 325 MG PO TABS: 325.0000 mg | ORAL_TABLET | Freq: Once | ORAL | Status: DC

## 2024-11-09 MED ORDER — LATANOPROST 0.005 % OP SOLN
1.0000 [drp] | Freq: Every day | OPHTHALMIC | Status: DC
  Administered 2024-11-09 – 2024-11-13 (×4): 1 [drp] via OPHTHALMIC
  Filled 2024-11-09 (×3): qty 2.5

## 2024-11-09 MED ORDER — SODIUM CHLORIDE 0.9 % IV SOLN
1.0000 g | INTRAVENOUS | Status: DC
  Administered 2024-11-09: 1 g via INTRAVENOUS
  Filled 2024-11-09: qty 10

## 2024-11-09 MED ORDER — TORSEMIDE 20 MG PO TABS
40.0000 mg | ORAL_TABLET | Freq: Two times a day (BID) | ORAL | Status: DC
  Administered 2024-11-09 – 2024-11-10 (×3): 40 mg via ORAL
  Filled 2024-11-09 (×3): qty 2

## 2024-11-09 MED ORDER — IPRATROPIUM-ALBUTEROL 0.5-2.5 (3) MG/3ML IN SOLN
3.0000 mL | Freq: Once | RESPIRATORY_TRACT | Status: AC
  Administered 2024-11-09: 3 mL via RESPIRATORY_TRACT
  Filled 2024-11-09: qty 3

## 2024-11-09 MED ORDER — BUSPIRONE HCL 10 MG PO TABS
5.0000 mg | ORAL_TABLET | Freq: Three times a day (TID) | ORAL | Status: DC
  Administered 2024-11-09 – 2024-11-14 (×16): 5 mg via ORAL
  Filled 2024-11-09 (×16): qty 1

## 2024-11-09 MED ORDER — GABAPENTIN 100 MG PO CAPS
200.0000 mg | ORAL_CAPSULE | Freq: Two times a day (BID) | ORAL | Status: DC
  Administered 2024-11-09 – 2024-11-14 (×10): 200 mg via ORAL
  Filled 2024-11-09 (×10): qty 2

## 2024-11-09 MED ORDER — LEVOTHYROXINE SODIUM 50 MCG PO TABS
50.0000 ug | ORAL_TABLET | Freq: Every day | ORAL | Status: DC
  Administered 2024-11-09 – 2024-11-14 (×5): 50 ug via ORAL
  Filled 2024-11-09 (×5): qty 1

## 2024-11-09 MED ORDER — FLUTICASONE FUROATE-VILANTEROL 100-25 MCG/ACT IN AEPB
1.0000 | INHALATION_SPRAY | Freq: Every day | RESPIRATORY_TRACT | Status: DC
  Administered 2024-11-09 – 2024-11-14 (×6): 1 via RESPIRATORY_TRACT
  Filled 2024-11-09 (×2): qty 28

## 2024-11-09 MED ORDER — PHENAZOPYRIDINE HCL 100 MG PO TABS
95.0000 mg | ORAL_TABLET | ORAL | Status: DC | PRN
  Filled 2024-11-09: qty 1

## 2024-11-09 MED ORDER — TRAMADOL HCL 50 MG PO TABS
50.0000 mg | ORAL_TABLET | Freq: Three times a day (TID) | ORAL | Status: DC | PRN
  Administered 2024-11-10 – 2024-11-13 (×2): 50 mg via ORAL
  Filled 2024-11-09 (×2): qty 1

## 2024-11-09 MED ORDER — IPRATROPIUM-ALBUTEROL 0.5-2.5 (3) MG/3ML IN SOLN
3.0000 mL | Freq: Four times a day (QID) | RESPIRATORY_TRACT | Status: DC
  Administered 2024-11-09: 3 mL via RESPIRATORY_TRACT
  Filled 2024-11-09: qty 3

## 2024-11-09 MED ORDER — AMLODIPINE BESYLATE 5 MG PO TABS: 5.0000 mg | ORAL_TABLET | Freq: Every day | ORAL | Status: DC

## 2024-11-09 MED ORDER — MAGNESIUM HYDROXIDE 400 MG/5ML PO SUSP: 5.0000 mL | Freq: Every day | ORAL | Status: DC | PRN

## 2024-11-09 MED ORDER — PANTOPRAZOLE SODIUM 40 MG PO TBEC
40.0000 mg | DELAYED_RELEASE_TABLET | Freq: Every day | ORAL | Status: DC
  Administered 2024-11-09 – 2024-11-10 (×2): 40 mg via ORAL
  Filled 2024-11-09 (×2): qty 1

## 2024-11-09 MED ORDER — GUAIFENESIN ER 600 MG PO TB12
600.0000 mg | ORAL_TABLET | Freq: Two times a day (BID) | ORAL | Status: DC
  Administered 2024-11-09 – 2024-11-14 (×10): 600 mg via ORAL
  Filled 2024-11-09 (×10): qty 1

## 2024-11-09 MED ORDER — HEPARIN (PORCINE) 25000 UT/250ML-% IV SOLN
1300.0000 [IU]/h | INTRAVENOUS | Status: DC
  Administered 2024-11-09: 750 [IU]/h via INTRAVENOUS
  Administered 2024-11-10: 1050 [IU]/h via INTRAVENOUS
  Administered 2024-11-11 – 2024-11-12 (×3): 1300 [IU]/h via INTRAVENOUS
  Filled 2024-11-09 (×5): qty 250

## 2024-11-09 MED ORDER — GABAPENTIN 300 MG PO CAPS: 300.0000 mg | ORAL_CAPSULE | Freq: Every day | ORAL | Status: DC

## 2024-11-09 MED ORDER — HEPARIN BOLUS VIA INFUSION
3600.0000 [IU] | Freq: Once | INTRAVENOUS | Status: AC
  Administered 2024-11-09: 3600 [IU] via INTRAVENOUS
  Filled 2024-11-09: qty 3600

## 2024-11-09 MED ORDER — IPRATROPIUM-ALBUTEROL 0.5-2.5 (3) MG/3ML IN SOLN
3.0000 mL | Freq: Four times a day (QID) | RESPIRATORY_TRACT | Status: DC
  Administered 2024-11-09 (×3): 3 mL via RESPIRATORY_TRACT
  Filled 2024-11-09 (×3): qty 3

## 2024-11-09 MED ORDER — ALBUTEROL SULFATE (2.5 MG/3ML) 0.083% IN NEBU
3.0000 mL | INHALATION_SOLUTION | Freq: Four times a day (QID) | RESPIRATORY_TRACT | Status: DC | PRN
  Administered 2024-11-09 – 2024-11-10 (×3): 3 mL via RESPIRATORY_TRACT
  Filled 2024-11-09 (×4): qty 3

## 2024-11-09 MED ORDER — CITALOPRAM HYDROBROMIDE 10 MG PO TABS
40.0000 mg | ORAL_TABLET | Freq: Every day | ORAL | Status: DC
  Administered 2024-11-09 – 2024-11-14 (×6): 40 mg via ORAL
  Filled 2024-11-09: qty 4
  Filled 2024-11-09 (×5): qty 2

## 2024-11-09 MED ORDER — SODIUM CHLORIDE 0.9 % IV SOLN
2.0000 g | Freq: Once | INTRAVENOUS | Status: AC
  Administered 2024-11-09: 2 g via INTRAVENOUS
  Filled 2024-11-09: qty 10

## 2024-11-09 MED ORDER — LACTATED RINGERS IV BOLUS
1000.0000 mL | Freq: Once | INTRAVENOUS | Status: AC
  Administered 2024-11-09: 1000 mL via INTRAVENOUS

## 2024-11-09 MED ORDER — SODIUM CHLORIDE 0.9 % IV SOLN
500.0000 mg | INTRAVENOUS | Status: AC
  Administered 2024-11-09 – 2024-11-13 (×5): 500 mg via INTRAVENOUS
  Filled 2024-11-09 (×5): qty 5

## 2024-11-09 MED ORDER — ACETAMINOPHEN 325 MG PO TABS
650.0000 mg | ORAL_TABLET | Freq: Four times a day (QID) | ORAL | Status: DC | PRN
  Administered 2024-11-09 – 2024-11-12 (×3): 650 mg via ORAL
  Filled 2024-11-09 (×3): qty 2

## 2024-11-09 MED ORDER — FAMOTIDINE 20 MG PO TABS
20.0000 mg | ORAL_TABLET | Freq: Every day | ORAL | Status: DC
  Administered 2024-11-09 – 2024-11-14 (×6): 20 mg via ORAL
  Filled 2024-11-09 (×6): qty 1

## 2024-11-09 MED ORDER — MIRTAZAPINE 15 MG PO TABS
7.5000 mg | ORAL_TABLET | Freq: Every day | ORAL | Status: DC
  Administered 2024-11-09 – 2024-11-13 (×4): 7.5 mg via ORAL
  Filled 2024-11-09 (×4): qty 1

## 2024-11-09 NOTE — Progress Notes (Signed)
 CODE SEPSIS - PHARMACY COMMUNICATION  **Broad Spectrum Antibiotics should be administered within 1 hour of Sepsis diagnosis**  Time Code Sepsis Called/Page Received: 9644  Antibiotics Ordered: Aztreonam  and Vancomycin   Time of 1st antibiotic administration: 0409  [place] CANDIE Dills, PharmD, [facility] 11/09/2024 4:33 AM

## 2024-11-09 NOTE — ED Provider Notes (Signed)
 "  [facility] Provider Note    None    (approximate)   History   Respiratory Distress   HPI  Helen Ramsey is a 89 y.o. female with history of hypertension, sleep apnea, COPD on oxygen  chronically, grade 1 diastolic dysfunction who presents to the emergency department EMS with cough, congestion and shortness of breath x several days.  Was diagnosed with pneumonia clinically and was awaiting chest x-ray at her nursing facility.  Was on azithromycin  and prednisone .  EMS reports patient got up to go to the bathroom and took her home oxygen  off.  She wears 2 L chronically.  States when she came back from the bathroom she was in respiratory distress.  On EMS arrival sats were 79%.  She was placed on CPAP.  Was given 2 DuoNeb treatments, 2 albuterol  treatments, 125 mg of Solu-Medrol  IV.  She was hypertensive and EMS placed in an show nitro placed on her right chest.  She denies to me that she is having chest discomfort.  No lower extremity swelling or pain.   History provided by patient, son, EMS.    Past Medical History:  Diagnosis Date   Abnormal SPEP    Allergic rhinitis, cause unspecified    Allergic rhinitis, cause unspecified    Arthritis    Atherosclerosis of aorta    Back pain    lumbar   Degeneration of lumbar or lumbosacral intervertebral disc    Diverticulitis of colon (without mention of hemorrhage)(562.11)    Dizziness and giddiness    Dysthymic disorder    Headache(784.0)    Myalgia and myositis, unspecified    Osteoporosis, unspecified    Other and unspecified hyperlipidemia    Other diseases of nasal cavity and sinuses(478.19)    Other specified disorders of bladder    Syncope    Trigger finger (acquired)    Unspecified essential hypertension    Unspecified sleep apnea    Urinary frequency     Past Surgical History:  Procedure Laterality Date   ABDOMINAL HYSTERECTOMY     ADENOIDECTOMY     APPENDECTOMY     ARTHROSCOPIC KNEE  SURGERY     BREAST BIOPSY Right    lymph node surgery, benign    BREAST EXCISIONAL BIOPSY     CARDIAC CATHETERIZATION     [facility]   CARPAL TUNNEL RELEASE     CATARACT EXTRACTION W/ INTRAOCULAR LENS IMPLANT & ANTERIOR VITRECTOMY, BILATERAL     CHOLECYSTECTOMY     KNEE ARTHROPLASTY Right 01/30/2016   Procedure: COMPUTER ASSISTED TOTAL KNEE ARTHROPLASTY;  Surgeon: Lynwood SHAUNNA Hue, MD;  Location: [facility] ORS;  Service: Orthopedics;  Laterality: Right;   R/O Lymph Nodes; Right Axilla     ROTATOR CUFF REPAIR Right    SPG block  03/20/2016   sphenopalatine ganglion and maxillary division of trigeminal nerve block block Dr. Maree   TEMPORAL ARTERY BIOPSY / LIGATION     TONSILLECTOMY     TUBAL LIGATION      MEDICATIONS:  Prior to Admission medications  Medication Sig Start Date End Date Taking? Authorizing Provider  acetaminophen  (TYLENOL ) 500 MG tablet Take 1,000 mg by mouth in the morning and at bedtime.    [provider]  albuterol  (VENTOLIN  HFA) 108 (90 Base) MCG/ACT inhaler Inhale 2 puffs into the lungs every 6 (six) hours as needed for wheezing or shortness of breath. 12/29/22 12/29/23  [provider]  ALPRAZolam  (XANAX ) 0.25 MG tablet Take 1 tablet (0.25 mg total)  by mouth 2 (two) times daily as needed for anxiety. TAKE 1 TABLET BY MOUTH TWICE DAILY AS NEEDED FOR ANXIETY 02/03/23   Jens Durand, MD  apixaban  (ELIQUIS ) 5 MG TABS tablet Take 2 tablets (10 mg total) by mouth 2 (two) times daily for 7 days. 02/03/23 02/10/23  Jens Durand, MD  apixaban  (ELIQUIS ) 5 MG TABS tablet Take 1 tablet (5 mg total) by mouth 2 (two) times daily. 02/10/23   Jens Durand, MD  atorvastatin  (LIPITOR) 20 MG tablet Take 1 tablet (20 mg total) by mouth daily. 11/20/22   Bacigalupo, Angela M, MD  Cholecalciferol  (VITAMIN D3 PO) Take 1,000 Units by mouth.    [provider]  citalopram  (CELEXA ) 40 MG tablet Take 1 tablet (40 mg total) by mouth daily. 11/20/22   Bacigalupo, Angela M, MD   cyanocobalamin  1000 MCG tablet Take 1,000 mcg by mouth daily.    [provider]  Dentifrices (FLUORIDE TOOTHPASTE DT) Place onto teeth daily.     [provider]  EMGALITY, 300 MG DOSE, 100 MG/ML SOSY Inject into the skin. 01/23/23   [provider]  famotidine  (PEPCID ) 20 MG tablet Take 1 tablet (20 mg total) by mouth daily. 11/20/22   Myrla Jon HERO, MD  ferrous sulfate  325 (65 FE) MG tablet Take 325 mg by mouth daily with supper.    [provider]  fluticasone  furoate-vilanterol (BREO ELLIPTA ) 100-25 MCG/ACT AEPB Inhale 1 puff into the lungs daily. 11/20/22   Bacigalupo, Angela M, MD  gabapentin  (NEURONTIN ) 100 MG capsule Take 2 capsules (200 mg total) by mouth 2 (two) times daily AND 3 capsules (300 mg total) at bedtime. 11/20/22   Bacigalupo, Angela M, MD  glycerin  adult 2 g suppository Place 1 suppository rectally as needed for constipation. 12/18/22   Myrla Jon HERO, MD  hydroxychloroquine  (PLAQUENIL ) 200 MG tablet Take 200 mg by mouth daily. 01/20/22   [provider]  levothyroxine  (SYNTHROID ) 50 MCG tablet TAKE 1 TABLET(50 MCG) BY MOUTH DAILY BEFORE BREAKFAST Patient taking differently: Take 50 mcg by mouth daily before breakfast. TAKE 1 TABLET(50 MCG) BY MOUTH DAILY BEFORE BREAKFAST 11/20/22   Bacigalupo, Jon HERO, MD  linaclotide  (LINZESS ) 145 MCG CAPS capsule Take 1 capsule (145 mcg total) by mouth daily before breakfast. 11/20/22   Bacigalupo, Jon HERO, MD  MAGNESIUM  GLYCINATE PO Take 400 mg by mouth daily.    [provider]  magnesium  hydroxide (MILK OF MAGNESIA) 400 MG/5ML suspension Take 5 mLs by mouth daily as needed for mild constipation.    [provider]  melatonin 5 MG TABS Take 5 mg by mouth at bedtime as needed.    [provider]  methotrexate  (RHEUMATREX) 2.5 MG tablet Take 10 mg by mouth once a week. 04/28/22   [provider]  Omega-3 Fatty Acids (FISH OIL) 1200 MG CAPS Take 1,200 mg by mouth  daily.    [provider]  omeprazole  (PRILOSEC) 20 MG capsule TAKE 1 CAPSULE(20 MG) BY MOUTH DAILY 09/08/23   Bacigalupo, Angela M, MD  phenazopyridine  (AZO-TABS) 95 MG tablet Take 1 tablet (95 mg total) by mouth as needed. Patient taking differently: Take 95 mg by mouth 2 (two) times daily. 05/29/16   Vivienne Delon HERO, PA-C  Polyethylene Glycol 3350  (MIRALAX  PO) Take 17 g by mouth daily.    [provider]  potassium gluconate 595 (99 K) MG TABS tablet Take 1 tablet (595 mg total) by mouth 2 (two) times daily. 12/27/18   Vivienne Delon  M, PA-C  senna (SENOKOT) 8.6 MG tablet Take 1 tablet (8.6 mg total) by mouth daily. 09/08/22   Myrla Jon HERO, MD  sodium chloride  (OCEAN) 0.65 % nasal spray Place 1 spray into the nose as needed.    [provider]  Torsemide  40 MG TABS Take 40 mg by mouth 2 (two) times daily. 12/04/22 12/04/23  [provider]  traMADol  (ULTRAM ) 50 MG tablet Take 1 tablet (50 mg total) by mouth 3 (three) times daily as needed. 02/03/23   Jens Durand, MD  triamcinolone  cream (KENALOG ) 0.1 % 2 weeks on and off 2 weeks 12/20/21   [provider]    Physical Exam   Triage Vital Signs: ED Triage Vitals [11/09/24 0353]  Encounter Vitals Group     BP (!) 179/80     Girls Systolic BP Percentile      Girls Diastolic BP Percentile      Boys Systolic BP Percentile      Boys Diastolic BP Percentile      Pulse Rate (!) 103     Resp (!) 22     Temp 98.1 F (36.7 C)     Temp Source Axillary     SpO2 99 %     Weight      Height      Head Circumference      Peak Flow      Pain Score      Pain Loc      Pain Education      Exclude from Growth Chart     Most recent vital signs: Vitals:   11/09/24 0630 11/09/24 0645  BP: (!) 136/57   Pulse: 78 81  Resp: 14 18  Temp:    SpO2: 96% 98%    CONSTITUTIONAL: Alert, elderly, in respiratory distress HEAD: Normocephalic, atraumatic EYES: Conjunctivae clear, pupils appear  equal, sclera nonicteric ENT: normal nose; moist mucous membranes NECK: Supple, normal ROM CARD: Regular and tachycardic; S1 and S2 appreciated RESP: Patient is tachypneic.  Rhonchorous breath sounds diffusely.  Diminished aeration at bases.  No rales or wheezing.  Increased work of breathing.  Speaking truncated sentences.  In respiratory distress.  No hypoxia on BiPAP. ABD/GI: Non-distended; soft, non-tender, no rebound, no guarding, no peritoneal signs BACK: The back appears normal EXT: Normal ROM in all joints; no deformity noted, no edema, no calf tenderness or calf swelling SKIN: Normal color for age and race; warm; no rash on exposed skin NEURO: Moves all extremities equally, normal speech PSYCH: The patient's mood and manner are appropriate.   ED Results / Procedures / Treatments   LABS: (all labs ordered are listed, but only abnormal results are displayed) Labs Reviewed  LACTIC ACID, PLASMA - Abnormal; Notable for the following components:      Result Value   Lactic Acid, Venous 2.1 (*)    All other components within normal limits  COMPREHENSIVE METABOLIC PANEL WITH GFR - Abnormal; Notable for the following components:   Glucose, Bld 117 (*)    BUN 36 (*)    Creatinine, Ser 1.10 (*)    AST 103 (*)    ALT 116 (*)    Alkaline Phosphatase 132 (*)    GFR, Estimated 48 (*)    All other components within normal limits  CBC WITH DIFFERENTIAL/PLATELET - Abnormal; Notable for the following components:   WBC 13.7 (*)    Neutro Abs 12.2 (*)    All other components within normal limits  PRO BRAIN NATRIURETIC  PEPTIDE - Abnormal; Notable for the following components:   Pro Brain Natriuretic Peptide 808.0 (*)    All other components within normal limits  BLOOD GAS, VENOUS - Abnormal; Notable for the following components:   pO2, Ven 69 (*)    Bicarbonate 29.2 (*)    Acid-Base Excess 3.8 (*)    All other components within normal limits  TROPONIN T, HIGH SENSITIVITY - Abnormal;  Notable for the following components:   Troponin T High Sensitivity 57 (*)    All other components within normal limits  TROPONIN T, HIGH SENSITIVITY - Abnormal; Notable for the following components:   Troponin T High Sensitivity 314 (*)    All other components within normal limits  RESP PANEL BY RT-PCR (RSV, FLU A&B, COVID)  RVPGX2  CULTURE, BLOOD (ROUTINE X 2)  CULTURE, BLOOD (ROUTINE X 2)  PROTIME-INR  PROCALCITONIN  LACTIC ACID, PLASMA  URINALYSIS, W/ REFLEX TO CULTURE (INFECTION SUSPECTED)  LIPID PANEL  HEPARIN  LEVEL (UNFRACTIONATED)  APTT     EKG:  EKG Interpretation Date/Time:  Wednesday November 09 2024 03:56:45 EST Ventricular Rate:  103 PR Interval:  194 QRS Duration:  98 QT Interval:  357 QTC Calculation: 468 R Axis:   -62  Text Interpretation: Sinus tachycardia Left anterior fascicular block Probable anteroseptal infarct, recent Lateral leads are also involved Confirmed by Neomi Neptune [phone]) on 11/09/2024 4:28:07 AM         RADIOLOGY: My personal review and interpretation of imaging: Chest x-ray shows left lower lobe pneumonia.  I have personally reviewed all radiology reports.   DG Chest Port 1 View Result Date: 11/09/2024 EXAM: 1 VIEW(S) XRAY OF THE CHEST 11/09/2024 04:10:00 AM COMPARISON: Portable chest 01/28/2023. CLINICAL HISTORY: Questionable sepsis - evaluate for abnormality. FINDINGS: LUNGS AND PLEURA: There is streaky opacity in the retrocardiac left lower lobe, findings are similar to the prior study and could represent scarring, chronic atelectasis, pneumonia or aspiration. The lungs are otherwise clear. No pleural effusion. No pneumothorax. HEART AND MEDIASTINUM: There is tortuosity and atherosclerosis of the aorta with a stable mediastinum. No acute abnormality of the cardiac silhouette. BONES AND SOFT TISSUES: Osteopenia and degenerative change of the thoracic spine. There is a densely calcified splenic mass in the left upper abdomen, seen  previously. IMPRESSION: 1. Streaky opacity in the retrocardiac left lower lobe, similar to the prior study, which may represent scarring or chronic atelectasis, with pneumonia or aspiration not excluded. Electronically signed by: Francis Quam MD 11/09/2024 05:05 AM EST RP Workstation: [id number]     PROCEDURES:  Critical Care performed: Yes, see critical care procedure note(s)   CRITICAL CARE Performed by: Neptune Neomi   Total critical care time: 30 minutes  Critical care time was exclusive of separately billable procedures and treating other patients.  Critical care was necessary to treat or prevent imminent or life-threatening deterioration.  Critical care was time spent personally by me on the following activities: development of treatment plan with patient and/or surrogate as well as nursing, discussions with consultants, evaluation of patient's response to treatment, examination of patient, obtaining history from patient or surrogate, ordering and performing treatments and interventions, ordering and review of laboratory studies, ordering and review of radiographic studies, pulse oximetry and re-evaluation of patient's condition.   SABRA1-3 Lead EKG Interpretation  Performed by: Asuncion Shibata, Neptune SAILOR, DO Authorized by: Tasheem Elms, Neptune SAILOR, DO     Interpretation: normal     ECG rate:  81   ECG rate assessment: normal  Rhythm: sinus rhythm     Ectopy: none     Conduction: normal       IMPRESSION / MDM / ASSESSMENT AND PLAN / ED COURSE  I reviewed the triage vital signs and the nursing notes.    Patient here with fevers, cough, congestion, shortness of breath.  In respiratory distress with hypoxia with EMS.  On CPAP on arrival.  The patient is on the cardiac monitor to evaluate for evidence of arrhythmia and/or significant heart rate changes.   DIFFERENTIAL DIAGNOSIS (includes but not limited to):   Pneumonia, COPD, CHF, PE, viral URI, pneumothorax   Patient's presentation is  most consistent with acute presentation with potential threat to life or bodily function.   PLAN: Will obtain labs, cultures, urine, COVID and flu swab, chest x-ray.  Patient doing well on BiPAP.  Will give breathing treatments as needed and start broad-spectrum antibiotics for possible sepsis.   MEDICATIONS GIVEN IN ED: Medications  ondansetron  (ZOFRAN ) injection 4 mg (has no administration in time range)  ipratropium-albuterol  (DUONEB) 0.5-2.5 (3) MG/3ML nebulizer solution 3 mL (has no administration in time range)  ipratropium-albuterol  (DUONEB) 0.5-2.5 (3) MG/3ML nebulizer solution 3 mL (has no administration in time range)  heparin  bolus via infusion 3,600 Units (has no administration in time range)  heparin  ADULT infusion 100 units/mL (25000 units/250mL) (has no administration in time range)  aztreonam  (AZACTAM ) 2 g in sodium chloride  0.9 % 100 mL IVPB (0 g Intravenous Stopped 11/09/24 0442)  vancomycin  (VANCOCIN ) IVPB 1000 mg/200 mL premix (0 mg Intravenous Stopped 11/09/24 0606)  ipratropium-albuterol  (DUONEB) 0.5-2.5 (3) MG/3ML nebulizer solution 3 mL (3 mLs Nebulization Given 11/09/24 0404)  lactated ringers  bolus 1,000 mL (1,000 mLs Intravenous New Bag/Given 11/09/24 0515)  aspirin  suppository 300 mg (300 mg Rectal Given 11/09/24 0710)     ED COURSE: Labs show leukocytosis of 13,000.  May be secondary to infection versus recent steroid use versus reactive in nature.  Rectal temperature here is normal.  Lactic minimally elevated.  Getting IV fluids.  This could be from sepsis but also could be related to respiratory status, albuterol  use.  Chest x-ray reviewed and interpreted by myself and the radiologist and shows left lower lobe pneumonia versus aspiration.  She is getting antibiotics.  COVID, flu and RSV negative.  Procalcitonin slightly elevated.  BNP only minimally elevated.  Does not appear volume overloaded.  Troponin elevated but this could be related to demand  ischemia.  Not having any chest pain.  Second pending.  Will discuss with hospitalist for admission.  VBG reassuring.  Patient improving clinically on BiPAP and now able to speak full sentences.  Tachypnea has resolved.   CONSULTS:  Consulted and discussed patient's case with hospitalist, Dr. Shona.  I have recommended admission and consulting physician agrees and will place admission orders.  Patient (and family if present) agree with this plan.   I reviewed all nursing notes, vitals, pertinent previous records.  All labs, EKGs, imaging ordered have been independently reviewed and interpreted by myself.    OUTSIDE RECORDS REVIEWED: Reviewed last echocardiogram.  Reviewed admission in March 2024.       FINAL CLINICAL IMPRESSION(S) / ED DIAGNOSES   Final diagnoses:  Acute respiratory failure with hypoxia (HCC)  Sepsis due to pneumonia [facility])     Rx / DC Orders   ED Discharge Orders     None        Note:  This document was prepared using Dragon voice recognition software and may  include unintentional dictation errors.   Terriyah Westra, Josette SAILOR, DO 11/09/24 [phone]  "

## 2024-11-09 NOTE — H&P (Signed)
 " History and Physical    Helen Ramsey FMW:[id number] DOB: 02/21/1933 DOA: 11/09/2024  PCP: Myrla Jon CHRISTELLA, MD (Confirm with patient/family/NH records and if not entered, this has to be entered at [facility] point of entry) Patient coming from: SNF  I have personally briefly reviewed patient's old medical records in [facility]  Chief Complaint: COUgh, SOB, fever  HPI: Helen Ramsey is a 89 y.o. female with medical history significant of chronic HFpEF, HTN, chronic hypoxic respiratory failure on 2 L continuously, CKD stage IIIb, RA on methotrexate , presented with worsening of cough shortness of breath and fever.  Family at bedside gave history, patient has been having a productive cough for about 1 week, when patient started to have yellow sputum production, increasing exertional dyspnea.  3 days ago patient spiked fever 101 F.  Family only reported occasional cough after eating but attributed that to  does not like to chew much before swallowing or swallowing too fast.  In addition, patient had a RSV infection this summer and since then has been having a chronic nonproductive cough until last week.  At baseline patient is not very active, she engaged in activity up to 2 times a week with 40-60 minutes walk other times she is sitting in a recliner.  Denies any orthopnea.  Left-sided ankle with some more swollen, no significant weight change.  Last night patient was found to be hypoxic with O2 saturations in the lower 70s and EMS was called. ED Course: Afebrile, tachycardia blood pressure 150/70 O2 saturation 77% on room air and patient started on BiPAP 40% 16/8 with O2 saturation 100% on room air.  Chest x-ray showed left lower lobe scarring versus atelectasis versus pneumonia, blood work showed WBC 13.7 hemoglobin 12.7 BUN 36 creatinine 1.1 troponin 52> 314  Patient was given vancomycin , heparin  drip.  Review of Systems: Unable to perform, patient BiPAP with respiratory  distress  Past Medical History:  Diagnosis Date   Abnormal SPEP    Allergic rhinitis, cause unspecified    Allergic rhinitis, cause unspecified    Arthritis    Atherosclerosis of aorta    Back pain    lumbar   Degeneration of lumbar or lumbosacral intervertebral disc    Diverticulitis of colon (without mention of hemorrhage)(562.11)    Dizziness and giddiness    Dysthymic disorder    Headache(784.0)    Myalgia and myositis, unspecified    Osteoporosis, unspecified    Other and unspecified hyperlipidemia    Other diseases of nasal cavity and sinuses(478.19)    Other specified disorders of bladder    Syncope    Trigger finger (acquired)    Unspecified essential hypertension    Unspecified sleep apnea    Urinary frequency     Past Surgical History:  Procedure Laterality Date   ABDOMINAL HYSTERECTOMY     ADENOIDECTOMY     APPENDECTOMY     ARTHROSCOPIC KNEE SURGERY     BREAST BIOPSY Right    lymph node surgery, benign    BREAST EXCISIONAL BIOPSY     CARDIAC CATHETERIZATION     [facility]   CARPAL TUNNEL RELEASE     CATARACT EXTRACTION W/ INTRAOCULAR LENS IMPLANT & ANTERIOR VITRECTOMY, BILATERAL     CHOLECYSTECTOMY     KNEE ARTHROPLASTY Right 01/30/2016   Procedure: COMPUTER ASSISTED TOTAL KNEE ARTHROPLASTY;  Surgeon: Lynwood SHAUNNA Hue, MD;  Location: [facility] ORS;  Service: Orthopedics;  Laterality: Right;   R/O Lymph Nodes; Right Axilla  ROTATOR CUFF REPAIR Right    SPG block  03/20/2016   sphenopalatine ganglion and maxillary division of trigeminal nerve block block Dr. Maree   TEMPORAL ARTERY BIOPSY / LIGATION     TONSILLECTOMY     TUBAL LIGATION       reports that she has never smoked. She has never used smokeless tobacco. She reports that she does not drink alcohol and does not use drugs.  Allergies[1]  Family History  Problem Relation Age of Onset   Heart disease Mother    Depression Mother    Colon polyps Mother    Dementia Mother    Heart disease Father     Dementia Father    Heart disease Brother    Hypertension Brother    Heart disease Brother    Hypertension Brother    Kidney cancer Neg Hx    Bladder Cancer Neg Hx      Prior to Admission medications  Medication Sig Start Date End Date Taking? Authorizing Provider  amLODipine  (NORVASC ) 5 MG tablet Take 5 mg by mouth daily. 10/15/24  Yes [provider]  busPIRone  (BUSPAR ) 5 MG tablet Take 5 mg by mouth 3 (three) times daily. 11/04/24  Yes [provider]  calamine lotion Apply 1 Application topically daily as needed for itching.   Yes [provider]  Cholecalciferol  (VITAMIN D3 PO) Take 1,000 Units by mouth.   Yes [provider]  citalopram  (CELEXA ) 40 MG tablet Take 1 tablet (40 mg total) by mouth daily. 11/20/22  Yes Bacigalupo, Angela M, MD  famotidine  (PEPCID ) 20 MG tablet Take 1 tablet (20 mg total) by mouth daily. 11/20/22  Yes Bacigalupo, Angela M, MD  fluticasone  furoate-vilanterol (BREO ELLIPTA ) 100-25 MCG/ACT AEPB Inhale 1 puff into the lungs daily. 11/20/22  Yes Bacigalupo, Jon HERO, MD  gabapentin  (NEURONTIN ) 100 MG capsule Take 2 capsules (200 mg total) by mouth 2 (two) times daily AND 3 capsules (300 mg total) at bedtime. 11/20/22  Yes Bacigalupo, Angela M, MD  guaiFENesin  (MUCINEX ) 600 MG 12 hr tablet Take 600 mg by mouth. Once daily for 7 days   Yes [provider]  ipratropium-albuterol  (DUONEB) 0.5-2.5 (3) MG/3ML SOLN Inhale 3 mLs into the lungs 3 (three) times daily as needed. 11/07/24  Yes [provider]  latanoprost  (XALATAN ) 0.005 % ophthalmic solution Place 1 drop into both eyes at bedtime. 11/04/24  Yes [provider]  levothyroxine  (SYNTHROID ) 50 MCG tablet TAKE 1 TABLET(50 MCG) BY MOUTH DAILY BEFORE BREAKFAST Patient taking differently: Take 50 mcg by mouth daily before breakfast. TAKE 1 TABLET(50 MCG) BY MOUTH DAILY BEFORE BREAKFAST 11/20/22  Yes Bacigalupo, Jon HERO, MD  linaclotide  (LINZESS ) 145 MCG CAPS  capsule Take 1 capsule (145 mcg total) by mouth daily before breakfast. 11/20/22  Yes Bacigalupo, Angela M, MD  MAGNESIUM  GLYCINATE PO Take 400 mg by mouth daily.   Yes [provider]  magnesium  hydroxide (MILK OF MAGNESIA) 400 MG/5ML suspension Take 5 mLs by mouth daily as needed for mild constipation.   Yes [provider]  melatonin 5 MG TABS Take 5 mg by mouth at bedtime as needed.   Yes [provider]  methotrexate  (RHEUMATREX) 2.5 MG tablet Take 10 mg by mouth once a week. 04/28/22  Yes [provider]  mirtazapine  (REMERON ) 7.5 MG tablet Take 7.5 mg by mouth at bedtime. 10/16/24  Yes [provider]  omeprazole  (PRILOSEC) 20 MG capsule TAKE 1 CAPSULE(20 MG) BY MOUTH DAILY 09/08/23  Yes  Myrla Jon HERO, MD  Polyethylene Glycol 3350  (MIRALAX  PO) Take 17 g by mouth daily.   Yes [provider]  potassium gluconate 595 (99 K) MG TABS tablet Take 1 tablet (595 mg total) by mouth 2 (two) times daily. 12/27/18  Yes Vivienne Delon HERO, PA-C  senna (SENOKOT) 8.6 MG tablet Take 1 tablet (8.6 mg total) by mouth daily. 09/08/22  Yes Bacigalupo, Jon HERO, MD  sodium chloride  (OCEAN) 0.65 % nasal spray Place 1 spray into the nose as needed.   Yes [provider]  Torsemide  40 MG TABS Take 40 mg by mouth 2 (two) times daily. 12/04/22 11/09/24 Yes [provider]  traMADol  (ULTRAM ) 50 MG tablet Take 1 tablet (50 mg total) by mouth 3 (three) times daily as needed. 02/03/23  Yes Jens Durand, MD  acetaminophen  (TYLENOL ) 500 MG tablet Take 1,000 mg by mouth in the morning and at bedtime.    [provider]  albuterol  (VENTOLIN  HFA) 108 (90 Base) MCG/ACT inhaler Inhale 2 puffs into the lungs every 6 (six) hours as needed for wheezing or shortness of breath. 12/29/22 12/29/23  [provider]  ALPRAZolam  (XANAX ) 0.25 MG tablet Take 1 tablet (0.25 mg total) by mouth 2 (two) times daily as needed for anxiety. TAKE 1 TABLET BY  MOUTH TWICE DAILY AS NEEDED FOR ANXIETY Patient not taking: Reported on 11/09/2024 02/03/23   Jens Durand, MD  apixaban  (ELIQUIS ) 5 MG TABS tablet Take 2 tablets (10 mg total) by mouth 2 (two) times daily for 7 days. 02/03/23 02/10/23  Jens Durand, MD  apixaban  (ELIQUIS ) 5 MG TABS tablet Take 1 tablet (5 mg total) by mouth 2 (two) times daily. 02/10/23   Jens Durand, MD  atorvastatin  (LIPITOR) 20 MG tablet Take 1 tablet (20 mg total) by mouth daily. Patient not taking: Reported on 11/09/2024 11/20/22   Bacigalupo, Angela M, MD  cyanocobalamin  1000 MCG tablet Take 1,000 mcg by mouth daily.    [provider]  Dentifrices (FLUORIDE TOOTHPASTE DT) Place onto teeth daily.     [provider]  EMGALITY, 300 MG DOSE, 100 MG/ML SOSY Inject into the skin. 01/23/23   [provider]  ferrous sulfate  325 (65 FE) MG tablet Take 325 mg by mouth daily with supper.    [provider]  glycerin  adult 2 g suppository Place 1 suppository rectally as needed for constipation. 12/18/22   Myrla Jon HERO, MD  hydroxychloroquine  (PLAQUENIL ) 200 MG tablet Take 200 mg by mouth daily. Patient not taking: Reported on 11/09/2024 01/20/22   [provider]  Omega-3 Fatty Acids (FISH OIL) 1200 MG CAPS Take 1,200 mg by mouth daily. Patient not taking: Reported on 11/09/2024    [provider]  phenazopyridine  (AZO-TABS) 95 MG tablet Take 1 tablet (95 mg total) by mouth as needed. Patient taking differently: Take 95 mg by mouth 2 (two) times daily. 05/29/16   Vivienne Delon HERO, PA-C  triamcinolone  cream (KENALOG ) 0.1 % 2 weeks on and off 2 weeks 12/20/21   [provider]    Physical Exam: Vitals:   11/09/24 0700 11/09/24 0900 11/09/24 0923 11/09/24 0937  BP: (!) 135/56 92/71 (!) 129/55   Pulse: 76 78 76   Resp: (!) 24 19 17    Temp:    (!) 97.3 F (36.3 C)  TempSrc:    Oral  SpO2: 97% 98% 95%   Weight:      Height:        Constitutional: NAD, calm,  comfortable Vitals:   11/09/24 0700 11/09/24 0900 11/09/24 0923 11/09/24 0937  BP: (!) 135/56 92/71 (!) 129/55   Pulse: 76 78 76   Resp: (!) 24 19 17    Temp:    (!) 97.3 F (36.3 C)  TempSrc:    Oral  SpO2: 97% 98% 95%   Weight:      Height:       Eyes: PERRL, lids and conjunctivae normal ENMT: Mucous membranes are moist. Posterior pharynx clear of any exudate or lesions.Normal dentition.  Neck: normal, supple, no masses, no thyromegaly Respiratory: clear to auscultation bilaterally, no wheezing, crackles on bilateral lower fields, right more than left, increasing respiratory effort. No accessory muscle use.  Cardiovascular: Regular rate and rhythm, no murmurs / rubs / gallops.  1+ extremity edema predominantly on the left ankle. 2+ pedal pulses. No carotid bruits.  Abdomen: no tenderness, no masses palpated. No hepatosplenomegaly. Bowel sounds positive.  Musculoskeletal: no clubbing / cyanosis. No joint deformity upper and lower extremities. Good ROM, no contractures. Normal muscle tone.  Skin: no rashes, lesions, ulcers. No induration Neurologic: CN 2-12 grossly intact. Sensation intact, DTR normal. Strength 5/5 in all 4.  Psychiatric: Normal judgment and insight. Alert and oriented x 3. Normal mood.     Labs on Admission: I have personally reviewed following labs and imaging studies  CBC: Recent Labs  Lab 11/09/24 0356  WBC 13.7*  NEUTROABS 12.2*  HGB 12.7  HCT 38.5  MCV 88.7  PLT 232   Basic Metabolic Panel: Recent Labs  Lab 11/09/24 0356  NA 138  K 3.9  CL 98  CO2 26  GLUCOSE 117*  BUN 36*  CREATININE 1.10*  CALCIUM  9.6   GFR: Estimated Creatinine Clearance: 30 mL/min (A) (by C-G formula based on SCr of 1.1 mg/dL (H)). Liver Function Tests: Recent Labs  Lab 11/09/24 0356  AST 103*  ALT 116*  ALKPHOS 132*  BILITOT 0.4  PROT 7.9  ALBUMIN 4.1   No results for input(s): LIPASE, AMYLASE in the last 168 hours. No results for input(s): AMMONIA in  the last 168 hours. Coagulation Profile: Recent Labs  Lab 11/09/24 0356  INR 1.0   Cardiac Enzymes: No results for input(s): CKTOTAL, CKMB, CKMBINDEX, TROPONINI in the last 168 hours. BNP (last 3 results) Recent Labs    11/09/24 0356  PROBNP 808.0*   HbA1C: No results for input(s): HGBA1C in the last 72 hours. CBG: No results for input(s): GLUCAP in the last 168 hours. Lipid Profile: Recent Labs    11/09/24 0356  CHOL 196  HDL 43  LDLCALC 123*  TRIG 147  CHOLHDL 4.5   Thyroid  Function Tests: No results for input(s): TSH, T4TOTAL, FREET4, T3FREE, THYROIDAB in the last 72 hours. Anemia Panel: No results for input(s): VITAMINB12, FOLATE, FERRITIN, TIBC, IRON, RETICCTPCT in the last 72 hours. Urine analysis:    Component Value Date/Time   COLORURINE YELLOW (A) 01/28/2023 1945   APPEARANCEUR HAZY (A) 01/28/2023 1945   APPEARANCEUR Hazy (A) 07/15/2016 1548   LABSPEC 1.011 01/28/2023 1945   PHURINE 7.0 01/28/2023 1945   GLUCOSEU NEGATIVE 01/28/2023 1945   HGBUR NEGATIVE 01/28/2023 1945   BILIRUBINUR NEGATIVE 01/28/2023 1945   BILIRUBINUR Negative 06/04/2022 1114   BILIRUBINUR Negative 07/15/2016 1548   KETONESUR NEGATIVE 01/28/2023 1945   PROTEINUR 100 (A) 01/28/2023 1945   UROBILINOGEN 0.2 06/04/2022 1114   NITRITE NEGATIVE 01/28/2023 1945   LEUKOCYTESUR MODERATE (A) 01/28/2023 1945    Radiological Exams on Admission: DG Chest [facility]  1 View Result Date: 11/09/2024 EXAM: 1 VIEW(S) XRAY OF THE CHEST 11/09/2024 04:10:00 AM COMPARISON: Portable chest 01/28/2023. CLINICAL HISTORY: Questionable sepsis - evaluate for abnormality. FINDINGS: LUNGS AND PLEURA: There is streaky opacity in the retrocardiac left lower lobe, findings are similar to the prior study and could represent scarring, chronic atelectasis, pneumonia or aspiration. The lungs are otherwise clear. No pleural effusion. No pneumothorax. HEART AND MEDIASTINUM: There is tortuosity  and atherosclerosis of the aorta with a stable mediastinum. No acute abnormality of the cardiac silhouette. BONES AND SOFT TISSUES: Osteopenia and degenerative change of the thoracic spine. There is a densely calcified splenic mass in the left upper abdomen, seen previously. IMPRESSION: 1. Streaky opacity in the retrocardiac left lower lobe, similar to the prior study, which may represent scarring or chronic atelectasis, with pneumonia or aspiration not excluded. Electronically signed by: Francis Quam MD 11/09/2024 05:05 AM EST RP Workstation: [id number]    EKG: Independently reviewed.  Sinus rhythm, chronic ST changes.  Assessment/Plan Principal Problem:   Pneumonia Active Problems:   CHF (congestive heart failure) (HCC)   PNA (pneumonia)   NSTEMI (non-ST elevated myocardial infarction) (HCC)  (please populate well all problems here in Problem List. (For example, if patient is on BP meds at home and you resume or decide to hold them, it is a problem that needs to be her. Same for CAD, COPD, HLD and so on)  Acute hypoxic respiratory failure CAP, bacterial - Clinically suspect atypical pneumonia given the patient has no significant infiltrates on x-ray, concurrent elevation of LFTs. - Check atypical pneumonia study Legionella and mycoplasma - Continue current antibiotic coverage azithromycin  and ceftriaxone , culture sputum - Wean off BiPAP - DuoNebs and as needed albuterol  -Incentive spirometry and flutter valve - Other DDx, patient appears to be volume contracted with increasing BUN/creatinine ratio, low suspicion for CHF decompensation.  Continue p.o. diuresis.  Elevated troponins - No chest pains, clinically suspect demand ischemia secondary to acute hypoxia from pneumonia, somewhat low suspicion for ACS. - Trend troponins, echocardiogram ordered - Cardiology consulted, now patient is on heparin  drip.  Acute transaminitis - Suspect atypical pneumonia - Check RUQ ultrasound - Hold  off statin  Peripheral edema - Hold off amlodipine   History of DVT - On heparin  drip, hold off Eliquis   Anxiety/depression - Continue SSRI   DVT prophylaxis: Heparin  drip Code Status: DNR/DNI Family Communication: Son at bedside Disposition Plan: Patient is sick with acute hypoxic failure secondary to Pneumonia requiring IV antibiotics, expect more than 2 midnight hospital stay. Consults called: Cardiology Admission status: PCU admit   Cort ONEIDA Mana MD [facility] Pager [phone]  11/09/2024, 10:35 AM       [1]  Allergies Allergen Reactions   Amoxicillin    Clindamycin/Lincomycin    Doxycycline    Levaquin [Levofloxacin In D5w]    Mirabegron Nausea And Vomiting   Nsaids Other (See Comments)    GI upset   Oxycodone  Nausea Only    Other reaction(s): Hallucination   Phenergan [Promethazine Hcl]    Singulair [Montelukast]    Vicodin [Hydrocodone-Acetaminophen ]    Zocor [Simvastatin]    Zoloft  [Sertraline] Other (See Comments)   Ampicillin Rash   Bactrim [Sulfamethoxazole-Trimethoprim] Rash   Levofloxacin Other (See Comments)    Weakness   Penicillins Rash    Ampicillin, Clindamycin, Doxycycline Levaquin-severe headache, muscle and joint aches, weak   Tolmetin     Other reaction(s): Other (See Comments) GI upset   "

## 2024-11-09 NOTE — Progress Notes (Signed)
 PT Cancellation Note  Patient Details Name: Helen Ramsey MRN: [id number] DOB: 12/12/1932   Cancelled Treatment:    Reason Eval/Treat Not Completed: Patient not medically ready PT orders received, chart reviewed. Pt currently on bipap with uptrending troponins, awaiting cardiology consult. Will hold PT at this time.  Richerd Pinal, PT, DPT 11/09/2024, 11:07 AM    Richerd CHRISTELLA Pinal 11/09/2024, 11:07 AM

## 2024-11-09 NOTE — Sepsis Progress Note (Signed)
 Elink monitoring for the code sepsis protocol.

## 2024-11-09 NOTE — Sepsis Progress Note (Signed)
 Awaiting results of second lactic acid. It appears that the test was drawn earlier, but has not resulted. Sent message to bedside RN.

## 2024-11-09 NOTE — ED Notes (Signed)
 Attempted report to [facility]. Per floor staff - pt does not meet their criteria as she is on 40 FiO2. Staff to reach out to admitting team. Awaiting new bed assignment

## 2024-11-09 NOTE — ED Notes (Signed)
 Admitting provider [facility] informed of troponin elevation from 57 to 314. EKG obtained per MD order. New orders for heparin  gtt placed.

## 2024-11-09 NOTE — Progress Notes (Signed)
 Was notified by bedside RN notes uptrending high-sensitivity troponin with delta, 314 from 57.  Twelve-lead EKG obtained.  Heparin  drip initiated.  Cardiology consulted.  No charge note.

## 2024-11-09 NOTE — Consult Note (Signed)
 " CARDIOLOGY CONSULT NOTE               Patient ID: Helen Ramsey MRN: [id number] DOB/AGE: [id number] 88 y.o.  Admit date: 11/09/2024 Referring Physician Dr. Cort Mana hospitalist Primary Physician Jon Eva MD  Primary Cardiologist [facility] Reason for Consultation SOB Cough Congestion  HPI: 89 year old history of HFpEF hypertension chronic respiratory failure on 2 L chronic renal insufficiency stage IIIb rheumatoid arthritis obesity presented with cough congestion shortness of breath  Review of systems complete and found to be negative unless listed above     Past Medical History:  Diagnosis Date   Abnormal SPEP    Allergic rhinitis, cause unspecified    Allergic rhinitis, cause unspecified    Arthritis    Atherosclerosis of aorta    Back pain    lumbar   Degeneration of lumbar or lumbosacral intervertebral disc    Diverticulitis of colon (without mention of hemorrhage)(562.11)    Dizziness and giddiness    Dysthymic disorder    Headache(784.0)    Myalgia and myositis, unspecified    Osteoporosis, unspecified    Other and unspecified hyperlipidemia    Other diseases of nasal cavity and sinuses(478.19)    Other specified disorders of bladder    Syncope    Trigger finger (acquired)    Unspecified essential hypertension    Unspecified sleep apnea    Urinary frequency     Past Surgical History:  Procedure Laterality Date   ABDOMINAL HYSTERECTOMY     ADENOIDECTOMY     APPENDECTOMY     ARTHROSCOPIC KNEE SURGERY     BREAST BIOPSY Right    lymph node surgery, benign    BREAST EXCISIONAL BIOPSY     CARDIAC CATHETERIZATION     [facility]   CARPAL TUNNEL RELEASE     CATARACT EXTRACTION W/ INTRAOCULAR LENS IMPLANT & ANTERIOR VITRECTOMY, BILATERAL     CHOLECYSTECTOMY     KNEE ARTHROPLASTY Right 01/30/2016   Procedure: COMPUTER ASSISTED TOTAL KNEE ARTHROPLASTY;  Surgeon: Lynwood SHAUNNA Hue, MD;  Location: [facility] ORS;  Service: Orthopedics;  Laterality: Right;   R/O  Lymph Nodes; Right Axilla     ROTATOR CUFF REPAIR Right    SPG block  03/20/2016   sphenopalatine ganglion and maxillary division of trigeminal nerve block block Dr. Maree   TEMPORAL ARTERY BIOPSY / LIGATION     TONSILLECTOMY     TUBAL LIGATION      (Not in a hospital admission)  Social History   Socioeconomic History   Marital status: Widowed    Spouse name: Not on file   Number of children: 3   Years of education: Not on file   Highest education level: Some college, no degree  Occupational History   Occupation: retired  Tobacco Use   Smoking status: Never   Smokeless tobacco: Never  Vaping Use   Vaping status: Never Used  Substance and Sexual Activity   Alcohol use: No   Drug use: No   Sexual activity: Not on file  Other Topics Concern   Not on file  Social History Narrative   Not on file   Social Drivers of Health   Tobacco Use: Low Risk (09/09/2024)   Patient History    Smoking Tobacco Use: Never    Smokeless Tobacco Use: Never    Passive Exposure: Not on file  Financial Resource Strain: Low Risk (05/06/2022)   Overall Financial Resource Strain (CARDIA)    Difficulty of Paying Living Expenses: Not hard at  all  Food Insecurity: No Food Insecurity (01/29/2023)   Hunger Vital Sign    Worried About Running Out of Food in the Last Year: Never true    Ran Out of Food in the Last Year: Never true  Transportation Needs: No Transportation Needs (01/29/2023)   PRAPARE - Administrator, Civil Service (Medical): No    Lack of Transportation (Non-Medical): No  Physical Activity: Insufficiently Active (05/06/2022)   Exercise Vital Sign    Days of Exercise per Week: 2 days    Minutes of Exercise per Session: 20 min  Stress: No Stress Concern Present (05/06/2022)   [employer] of Occupational Health - Occupational Stress Questionnaire    Feeling of Stress : Not at all  Social Connections: Moderately Isolated (05/06/2022)   Social Connection and Isolation  Panel    Frequency of Communication with Friends and Family: More than three times a week    Frequency of Social Gatherings with Friends and Family: Once a week    Attends Religious Services: More than 4 times per year    Active Member of [employer] or Organizations: No    Attends Banker Meetings: Never    Marital Status: Widowed  Intimate Partner Violence: Not At Risk (01/29/2023)   Humiliation, Afraid, Rape, and Kick questionnaire    Fear of Current or Ex-Partner: No    Emotionally Abused: No    Physically Abused: No    Sexually Abused: No  Depression (PHQ2-9): Medium Risk (12/18/2022)   Depression (PHQ2-9)    PHQ-2 Score: 9  Alcohol Screen: Low Risk (12/18/2022)   Alcohol Screen    Last Alcohol Screening Score (AUDIT): 0  Housing: Low Risk (01/29/2023)   Housing    Last Housing Risk Score: 0  Utilities: Not At Risk (01/29/2023)   AHC Utilities    Threatened with loss of utilities: No  Health Literacy: Not on file    Family History  Problem Relation Age of Onset   Heart disease Mother    Depression Mother    Colon polyps Mother    Dementia Mother    Heart disease Father    Dementia Father    Heart disease Brother    Hypertension Brother    Heart disease Brother    Hypertension Brother    Kidney cancer Neg Hx    Bladder Cancer Neg Hx       Review of systems complete and found to be negative unless listed above      PHYSICAL EXAM  General: Respiratory failure well developed, well nourished, in  acute distress BiPAP mask HEENT:  Normocephalic and atramatic Neck:  No JVD.  Lungs: Diffuse bilateral rhonchi  to auscultation and percussion. Heart: HRRR . Normal S1 and S2 without gallops or murmurs.  Abdomen: Bowel sounds are positive, abdomen soft and non-tender  Msk:  Back normal, normal gait. Normal strength and tone for age. Extremities: No clubbing, cyanosis or edema.   Neuro: Alert and oriented X 3. Psych:  Good affect, responds appropriately  Labs:    Lab Results  Component Value Date   WBC 13.7 (H) 11/09/2024   HGB 12.7 11/09/2024   HCT 38.5 11/09/2024   MCV 88.7 11/09/2024   PLT 232 11/09/2024    Recent Labs  Lab 11/09/24 0356  NA 138  K 3.9  CL 98  CO2 26  BUN 36*  CREATININE 1.10*  CALCIUM  9.6  PROT 7.9  BILITOT 0.4  ALKPHOS 132*  ALT 116*  AST  103*  GLUCOSE 117*   Lab Results  Component Value Date   CKTOTAL 70 03/26/2021   TROPONINI < 0.02 06/21/2014    Lab Results  Component Value Date   CHOL 196 11/09/2024   CHOL 159 11/20/2022   CHOL 182 08/15/2021   Lab Results  Component Value Date   HDL 43 11/09/2024   HDL 44 11/20/2022   HDL 28 (L) 08/15/2021   Lab Results  Component Value Date   LDLCALC 123 (H) 11/09/2024   LDLCALC 75 11/20/2022   LDLCALC 93 08/15/2021   Lab Results  Component Value Date   TRIG 147 11/09/2024   TRIG 243 (H) 11/20/2022   TRIG 367 (H) 08/15/2021   Lab Results  Component Value Date   CHOLHDL 4.5 11/09/2024   CHOLHDL 3.6 11/20/2022   CHOLHDL 6.5 (H) 08/15/2021   No results found for: LDLDIRECT    Radiology: US  Abdomen Limited RUQ (LIVER/GB) Result Date: 11/09/2024 CLINICAL DATA:  Elevated liver function tests EXAM: ULTRASOUND ABDOMEN LIMITED RIGHT UPPER QUADRANT COMPARISON:  August 26, 2021 FINDINGS: Gallbladder: Status post cholecystectomy Common bile duct: Diameter: 4 mm which is within normal limits Liver: No focal lesion identified. Increased echogenicity of hepatic parenchyma is noted suggesting hepatic steatosis. Portal vein is patent on color Doppler imaging with normal direction of blood flow towards the liver. Other: None. IMPRESSION: Status post cholecystectomy. Probable hepatic steatosis. Electronically Signed   By: Lynwood Landy Raddle M.D.   On: 11/09/2024 12:59   DG Chest Port 1 View Result Date: 11/09/2024 EXAM: 1 VIEW(S) XRAY OF THE CHEST 11/09/2024 04:10:00 AM COMPARISON: Portable chest 01/28/2023. CLINICAL HISTORY: Questionable sepsis - evaluate for  abnormality. FINDINGS: LUNGS AND PLEURA: There is streaky opacity in the retrocardiac left lower lobe, findings are similar to the prior study and could represent scarring, chronic atelectasis, pneumonia or aspiration. The lungs are otherwise clear. No pleural effusion. No pneumothorax. HEART AND MEDIASTINUM: There is tortuosity and atherosclerosis of the aorta with a stable mediastinum. No acute abnormality of the cardiac silhouette. BONES AND SOFT TISSUES: Osteopenia and degenerative change of the thoracic spine. There is a densely calcified splenic mass in the left upper abdomen, seen previously. IMPRESSION: 1. Streaky opacity in the retrocardiac left lower lobe, similar to the prior study, which may represent scarring or chronic atelectasis, with pneumonia or aspiration not excluded. Electronically signed by: Francis Quam MD 11/09/2024 05:05 AM EST RP Workstation: [id number]    EKG: Normal sinus rhythm rate of 80 nonspecific ST-T changes  ASSESSMENT AND PLAN:  Acute hypoxic respiratory failure Community-acquired pneumonia Hypoxemia Shortness of breath Myocardial injury without myocardial infarction consistent with Mild obesity Congestive heart failure Transaminitis . Agree with admit for management and care for respiratory failure Supplemental oxygen  therapy BiPAP or CPAP as necessary Inhalers to help with respiratory failure Agree with broad-spectrum antibiotic therapy Follow-up troponins EKGs Short term anticoagulation with heparin  but then transition to DVT prophylaxis Echocardiogram will be helpful for further assessment evaluation Do not recommend any invasive cardiac procedures at this point  Signed: Cara JONETTA Lovelace MD, 11/09/2024, 3:09 PM      "

## 2024-11-09 NOTE — ED Notes (Signed)
[place], MD at bedside. °

## 2024-11-09 NOTE — Progress Notes (Signed)
 ANTICOAGULATION CONSULT NOTE  Pharmacy Consult for heparin  infusion Indication: NSTEMI  Allergies[1]  Patient Measurements: Height: 4' 11 (149.9 cm) Weight: 72.4 kg (159 lb 9.8 oz) IBW/kg (Calculated) : 43.2 HEPARIN  DW (KG): 59.5  Vital Signs: Temp: 97.5 F (36.4 C) (12/24 1632) Temp Source: Oral (12/24 1632) BP: 134/68 (12/24 1632) Pulse Rate: 92 (12/24 1632)  Labs: Recent Labs    11/09/24 0356 11/09/24 1518  HGB 12.7  --   HCT 38.5  --   PLT 232  --   APTT 33  --   LABPROT 13.9  --   INR 1.0  --   HEPARINUNFRC <0.10* <0.10*  CREATININE 1.10*  --     Estimated Creatinine Clearance: 30 mL/min (A) (by C-G formula based on SCr of 1.1 mg/dL (H)).   Medical History: Past Medical History:  Diagnosis Date   Abnormal SPEP    Allergic rhinitis, cause unspecified    Allergic rhinitis, cause unspecified    Arthritis    Atherosclerosis of aorta    Back pain    lumbar   Degeneration of lumbar or lumbosacral intervertebral disc    Diverticulitis of colon (without mention of hemorrhage)(562.11)    Dizziness and giddiness    Dysthymic disorder    Headache(784.0)    Myalgia and myositis, unspecified    Osteoporosis, unspecified    Other and unspecified hyperlipidemia    Other diseases of nasal cavity and sinuses(478.19)    Other specified disorders of bladder    Syncope    Trigger finger (acquired)    Unspecified essential hypertension    Unspecified sleep apnea    Urinary frequency     Medications:  PTA Meds: Hx of Apixaban  5 mg BID for DVT in March 2024, but last dose unknown and no recent dispense hx.  Assessment: Pt is a 89 yo female presenting to ED c/o respiratory distress, found with elevated BNP and Troponin level, trending up. PMH notable for HFpEF, CKD 3b, and DVT in 2024 s/p Eliquis .  12/24 Baseline hgb 12.7, plt 232, aPTT 33, and HL <0.1. Tropnin continuing to trend up 496.  Goal of Therapy:  Heparin  level 0.3-0.7 units/ml aPTT 66-102  seconds Monitor platelets by anticoagulation protocol: Yes   Date    Time    aPTT    Rate/comment 12/24   1518    <0.10   SUBtherapeutic @ 750 units/hr  Plan:  Bolus 1800 units and increase rate to 950 units/hr Will check HL in 8 hr after rate change HL & CBC daily while on heparin   Leonor Argyle, PharmD 11/09/2024 6:09 PM       [1]  Allergies Allergen Reactions   Amoxicillin    Clindamycin/Lincomycin    Doxycycline    Levaquin [Levofloxacin In D5w]    Mirabegron Nausea And Vomiting   Nsaids Other (See Comments)    GI upset   Oxycodone  Nausea Only    Other reaction(s): Hallucination   Phenergan [Promethazine Hcl]    Singulair [Montelukast]    Vicodin [Hydrocodone-Acetaminophen ]    Zocor [Simvastatin]    Zoloft  [Sertraline] Other (See Comments)   Ampicillin Rash   Bactrim [Sulfamethoxazole-Trimethoprim] Rash   Levofloxacin Other (See Comments)    Weakness   Penicillins Rash    Ampicillin, Clindamycin, Doxycycline Levaquin-severe headache, muscle and joint aches, weak   Tolmetin     Other reaction(s): Other (See Comments) GI upset

## 2024-11-09 NOTE — ED Triage Notes (Signed)
 Pt BIB [facility] from [facility] call out to Respiratory Distress. Found to be 79% RA after walking back to bed from the bathroom. Was placed on CPAP in transport. Given duo neb x2, 125 soul medrol , 1 inch NTG paste, and 1 albuterol  tx by SNF staff.   12/22 pt dx with PNA, hx COPD and baseline 2 L [place].  EMS VS 20 RR 41, ETCO2, 98% CPAP

## 2024-11-09 NOTE — Progress Notes (Signed)
 ANTICOAGULATION CONSULT NOTE  Pharmacy Consult for heparin  infusion Indication: NSTEMI  Allergies[1]  Patient Measurements: Height: 4' 11 (149.9 cm) Weight: 72.4 kg (159 lb 9.8 oz) IBW/kg (Calculated) : 43.2 HEPARIN  DW (KG): 59.5  Vital Signs: Temp: 98.3 F (36.8 C) (12/24 0400) Temp Source: Rectal (12/24 0400) BP: 136/57 (12/24 0630) Pulse Rate: 81 (12/24 0645)  Labs: Recent Labs    11/09/24 0356  HGB 12.7  HCT 38.5  PLT 232  LABPROT 13.9  INR 1.0  CREATININE 1.10*    Estimated Creatinine Clearance: 30 mL/min (A) (by C-G formula based on SCr of 1.1 mg/dL (H)).   Medical History: Past Medical History:  Diagnosis Date   Abnormal SPEP    Allergic rhinitis, cause unspecified    Allergic rhinitis, cause unspecified    Arthritis    Atherosclerosis of aorta    Back pain    lumbar   Degeneration of lumbar or lumbosacral intervertebral disc    Diverticulitis of colon (without mention of hemorrhage)(562.11)    Dizziness and giddiness    Dysthymic disorder    Headache(784.0)    Myalgia and myositis, unspecified    Osteoporosis, unspecified    Other and unspecified hyperlipidemia    Other diseases of nasal cavity and sinuses(478.19)    Other specified disorders of bladder    Syncope    Trigger finger (acquired)    Unspecified essential hypertension    Unspecified sleep apnea    Urinary frequency     Medications:  PTA Meds: Hx of Apixaban  5 mg BID, but last dose unknown and no recent dispense hx.  Assessment: Pt is a 89 yo female presenting to ED c/o respiratory distress, found with elevated BNP and Troponin level, trending up.  Goal of Therapy:  Heparin  level 0.3-0.7 units/ml aPTT 66-102 seconds Monitor platelets by anticoagulation protocol: Yes   Plan:  Bolus 3600 units x 1 Start heparin  infusion at 750 units/hr Will follow aPTT until correlation w/ HL confirmed Will check aPTT or HL in 8 hr after start of infusion HL & CBC daily while on  heparin   Rankin CANDIE Dills, PharmD, [facility] 11/09/2024 7:01 AM      [1]  Allergies Allergen Reactions   Amoxicillin    Clindamycin/Lincomycin    Doxycycline    Levaquin [Levofloxacin In D5w]    Mirabegron Nausea And Vomiting   Nsaids Other (See Comments)    GI upset   Oxycodone  Nausea Only    Other reaction(s): Hallucination   Phenergan [Promethazine Hcl]    Singulair [Montelukast]    Vicodin [Hydrocodone-Acetaminophen ]    Zocor [Simvastatin]    Zoloft  [Sertraline] Other (See Comments)   Ampicillin Rash   Bactrim [Sulfamethoxazole-Trimethoprim] Rash   Levofloxacin Other (See Comments)    Weakness   Penicillins Rash    Ampicillin, Clindamycin, Doxycycline Levaquin-severe headache, muscle and joint aches, weak   Tolmetin     Other reaction(s): Other (See Comments) GI upset

## 2024-11-10 ENCOUNTER — Inpatient Hospital Stay: Payer: Medicare (Managed Care)

## 2024-11-10 LAB — CBC
HCT: 35.5 % — ABNORMAL LOW (ref 36.0–46.0)
Hemoglobin: 11.9 g/dL — ABNORMAL LOW (ref 12.0–15.0)
MCH: 30 pg (ref 26.0–34.0)
MCHC: 33.5 g/dL (ref 30.0–36.0)
MCV: 89.4 fL (ref 80.0–100.0)
Platelets: 236 K/uL (ref 150–400)
RBC: 3.97 MIL/uL (ref 3.87–5.11)
RDW: 15.1 % (ref 11.5–15.5)
WBC: 9.3 K/uL (ref 4.0–10.5)
nRBC: 0 % (ref 0.0–0.2)

## 2024-11-10 LAB — LACTIC ACID, PLASMA
Lactic Acid, Venous: 4.1 mmol/L (ref 0.5–1.9)
Lactic Acid, Venous: 2.3 mmol/L (ref 0.5–1.9)

## 2024-11-10 LAB — EXPECTORATED SPUTUM ASSESSMENT W GRAM STAIN, RFLX TO RESP C

## 2024-11-10 LAB — BASIC METABOLIC PANEL WITH GFR
Anion gap: 13 (ref 5–15)
BUN: 38 mg/dL — ABNORMAL HIGH (ref 8–23)
CO2: 28 mmol/L (ref 22–32)
Calcium: 9.3 mg/dL (ref 8.9–10.3)
Chloride: 103 mmol/L (ref 98–111)
Creatinine, Ser: 1.04 mg/dL — ABNORMAL HIGH (ref 0.44–1.00)
GFR, Estimated: 51 mL/min — ABNORMAL LOW
Glucose, Bld: 111 mg/dL — ABNORMAL HIGH (ref 70–99)
Potassium: 3.6 mmol/L (ref 3.5–5.1)
Sodium: 144 mmol/L (ref 135–145)

## 2024-11-10 LAB — HEPATIC FUNCTION PANEL
ALT: 148 U/L — ABNORMAL HIGH (ref 0–44)
AST: 113 U/L — ABNORMAL HIGH (ref 15–41)
Albumin: 3.7 g/dL (ref 3.5–5.0)
Alkaline Phosphatase: 99 U/L (ref 38–126)
Bilirubin, Direct: 0.2 mg/dL (ref 0.0–0.2)
Indirect Bilirubin: 0.2 mg/dL — ABNORMAL LOW (ref 0.3–0.9)
Total Bilirubin: 0.4 mg/dL (ref 0.0–1.2)
Total Protein: 7 g/dL (ref 6.5–8.1)

## 2024-11-10 LAB — GLUCOSE, CAPILLARY: Glucose-Capillary: 211 mg/dL — ABNORMAL HIGH (ref 70–99)

## 2024-11-10 LAB — MRSA NEXT GEN BY PCR, NASAL: MRSA by PCR Next Gen: DETECTED — AB

## 2024-11-10 LAB — BLOOD GAS, ARTERIAL
Acid-Base Excess: 6.6 mmol/L — ABNORMAL HIGH (ref 0.0–2.0)
Bicarbonate: 30.5 mmol/L — ABNORMAL HIGH (ref 20.0–28.0)
Delivery systems: POSITIVE
Expiratory PAP: 6 cmH2O
FIO2: 60 %
Inspiratory PAP: 14 cmH2O
O2 Saturation: 93.3 %
Patient temperature: 37
pCO2 arterial: 40 mmHg (ref 32–48)
pH, Arterial: 7.49 — ABNORMAL HIGH (ref 7.35–7.45)
pO2, Arterial: 60 mmHg — ABNORMAL LOW (ref 83–108)

## 2024-11-10 LAB — HEPARIN LEVEL (UNFRACTIONATED)
Heparin Unfractionated: 0.25 [IU]/mL — ABNORMAL LOW (ref 0.30–0.70)
Heparin Unfractionated: <0.1 [IU]/mL — ABNORMAL LOW (ref 0.30–0.70)
Heparin Unfractionated: 0.33 [IU]/mL (ref 0.30–0.70)

## 2024-11-10 LAB — TROPONIN T, HIGH SENSITIVITY: Troponin T High Sensitivity: 664 ng/L (ref 0–19)

## 2024-11-10 MED ORDER — CHLORHEXIDINE GLUCONATE CLOTH 2 % EX PADS
6.0000 | MEDICATED_PAD | Freq: Every day | CUTANEOUS | Status: DC
  Administered 2024-11-10 – 2024-11-11 (×2): 6 via TOPICAL

## 2024-11-10 MED ORDER — FUROSEMIDE 10 MG/ML IJ SOLN
80.0000 mg | Freq: Once | INTRAMUSCULAR | Status: AC
  Administered 2024-11-10: 80 mg via INTRAVENOUS

## 2024-11-10 MED ORDER — VANCOMYCIN HCL 1500 MG/300ML IV SOLN
1500.0000 mg | Freq: Once | INTRAVENOUS | Status: AC
  Administered 2024-11-10: 1500 mg via INTRAVENOUS
  Filled 2024-11-10: qty 300

## 2024-11-10 MED ORDER — HEPARIN BOLUS VIA INFUSION
1800.0000 [IU] | Freq: Once | INTRAVENOUS | Status: AC
  Administered 2024-11-10: 1800 [IU] via INTRAVENOUS
  Filled 2024-11-10: qty 1800

## 2024-11-10 MED ORDER — PANTOPRAZOLE SODIUM 40 MG IV SOLR
40.0000 mg | INTRAVENOUS | Status: DC
  Administered 2024-11-10 – 2024-11-12 (×3): 40 mg via INTRAVENOUS
  Filled 2024-11-10 (×3): qty 10

## 2024-11-10 MED ORDER — IPRATROPIUM-ALBUTEROL 0.5-2.5 (3) MG/3ML IN SOLN
3.0000 mL | Freq: Four times a day (QID) | RESPIRATORY_TRACT | Status: DC | PRN
  Administered 2024-11-10 – 2024-11-13 (×7): 3 mL via RESPIRATORY_TRACT
  Filled 2024-11-10 (×8): qty 3

## 2024-11-10 MED ORDER — METHYLPREDNISOLONE SODIUM SUCC 40 MG IJ SOLR
40.0000 mg | Freq: Two times a day (BID) | INTRAMUSCULAR | Status: DC
  Administered 2024-11-10 – 2024-11-12 (×5): 40 mg via INTRAVENOUS
  Filled 2024-11-10 (×5): qty 1

## 2024-11-10 MED ORDER — LORAZEPAM 2 MG/ML IJ SOLN
0.5000 mg | INTRAMUSCULAR | Status: DC | PRN
  Administered 2024-11-10 – 2024-11-11 (×3): 0.5 mg via INTRAVENOUS
  Filled 2024-11-10 (×3): qty 1

## 2024-11-10 MED ORDER — FUROSEMIDE 10 MG/ML IJ SOLN
60.0000 mg | Freq: Two times a day (BID) | INTRAMUSCULAR | Status: DC
  Administered 2024-11-11 – 2024-11-12 (×3): 60 mg via INTRAVENOUS
  Filled 2024-11-10 (×3): qty 6

## 2024-11-10 MED ORDER — HEPARIN BOLUS VIA INFUSION
900.0000 [IU] | Freq: Once | INTRAVENOUS | Status: AC
  Administered 2024-11-10: 900 [IU] via INTRAVENOUS
  Filled 2024-11-10: qty 900

## 2024-11-10 MED ORDER — SODIUM CHLORIDE 3 % IN NEBU
4.0000 mL | INHALATION_SOLUTION | Freq: Two times a day (BID) | RESPIRATORY_TRACT | Status: AC
  Administered 2024-11-11 – 2024-11-13 (×5): 4 mL via RESPIRATORY_TRACT
  Filled 2024-11-10 (×6): qty 4

## 2024-11-10 MED ORDER — VANCOMYCIN HCL 750 MG/150ML IV SOLN: 750.0000 mg | INTRAVENOUS | Status: DC

## 2024-11-10 MED ORDER — LACTATED RINGERS IV BOLUS
500.0000 mL | Freq: Once | INTRAVENOUS | Status: AC
  Administered 2024-11-10: 500 mL via INTRAVENOUS

## 2024-11-10 NOTE — Progress Notes (Addendum)
 " PROGRESS NOTE    Helen Ramsey  FMW:[id number] DOB: [birth year] DOA: 11/09/2024 PCP: Myrla Jon CHRISTELLA, MD  Chief Complaint  Patient presents with   Respiratory Distress    Hospital Course:  Helen Ramsey is a 89 y.o. female with medical history significant of chronic HFpEF, HTN, chronic hypoxic respiratory failure on 2 L continuously, CKD stage IIIb, RA on methotrexate , presented with worsening of cough shortness of breath and fever.  Patient has been having productive cough for about 1 week, started to have yellow sputum production and dyspnea on exertion.  Patient was admitted due to acute on chronic hypoxic respiratory failure, multifocal pneumonia,  Hospital course as below  Subjective: Patient was examined at the bedside, new to me today. Earlier during the day patient had diffuse wheezing, unclear if she has asthma/COPD but is on Breo Ellipta  at home Started on IV Solu-Medrol  Also found to have lactic acid 4.1 and dry on exam, gave 300 cc of IV fluids  Later in the day patient was found to be tachycardic, tachypneic and desaturating Rapid response was called and patient was placed on BiPAP and transferred to stepdown for close monitoring Discussed plan of care with son and daughter-in-law at the bedside   Objective: Vitals:   11/10/24 1629 11/10/24 1631 11/10/24 1642 11/10/24 1712  BP: (!) 169/90 (!) 163/93 (!) 174/74 (!) 167/73  Pulse: (!) 118 (!) 119 (!) 121 (!) 104  Resp:  (!) 24 (!) 21 19  Temp:    97.9 F (36.6 C)  TempSrc:    Axillary  SpO2: (!) 86% (!) 85% 92% 91%  Weight:    66.2 kg  Height:    4' 11 (1.499 m)    Intake/Output Summary (Last 24 hours) at 11/10/2024 1715 Last data filed at 11/10/2024 1500 Gross per 24 hour  Intake 346.49 ml  Output 2300 ml  Net -1953.51 ml   Filed Weights   11/09/24 0357 11/10/24 1712  Weight: 72.4 kg 66.2 kg    Examination: General exam: Mild distress, tachypnea, on BiPAP Neck: normal, supple, no masses, no  thyromegaly Respiratory: Diffuse bilateral wheezing, crackles bibasilar, tachypneic Cardiovascular: Regular rate and rhythm, no murmurs / rubs / gallops.  1+ extremity edema predominantly on the left ankle. 2+ pedal pulses. No carotid bruits.  Abdomen: no tenderness, no masses palpated. No hepatosplenomegaly. Bowel sounds positive.  Musculoskeletal: no clubbing / cyanosis. No joint deformity upper and lower extremities. Good ROM, no contractures. Normal muscle tone.  Skin: no rashes, lesions, ulcers. No induration Neurologic: No gross focal deficits  Assessment & Plan:  Acute on chronic hypoxic respiratory failure Suspect multifactorial due to multifocal pneumonia, asthma/COPD exacerbation, acute on chronic HFpEF Was on BiPAP on presentation, was able to wean off to 3 L [place] Placed on BiPAP again today, will wean as able Baseline on 2 L [place] supplemental oxygen   Multifocal pneumonia Lactic acidosis improving, leukocytosis resolved MRSA nares positive Add vancomycin , continue ceftriaxone , azithromycin  RVP pending, Legionella, mycoplasma Follow respiratory culture, blood culture SLP eval when stable  Acute asthma/COPD exacerbation Start IV Solu-Medrol , azithromycin  Home inhalers, DuoNebs as needed Mucinex  Incentive spirometry, flutter valve, hypertonic saline nebs  Acute on chronic HFpEF BNP elevated Continue IV lasix  60mg  bid Monitor daily weight, intake/output  Elevated troponin Troponin uptrending, denies chest pain Suspect demand ischemia due to pneumonia, CHF, asthma/COPD exacerbation Echo pending Continue IV heparin , Trend troponin Cardiology following, appreciate recs  CKD stage 3a/b Monitor creatinine, stable  Acute transaminitis Suspect atypical pneumonia US   abd status postcholecystectomy, probable hepatic steatosis Hold off statin Trend LFT's  HLD LDL 123, not compliant with statins  HTN Hold amlodipine   History of DVT On heparin  drip, hold off Eliquis     Anxiety/depression Continue SSRI, Buspar   GERD Pepcid , PPI  Rheumatoid arthritis Hold Methotrexate   Obesity class I Body mass index is 32.24 kg/m. - Outpatient follow up for lifestyle modification and risk factor management   DVT prophylaxis: IV Heparin    Code Status: Limited: Do not attempt resuscitation (DNR) -DNR-LIMITED -Do Not Intubate/DNI  Disposition:  TBD  Consultants:  Cardiology  Procedures:  None  Antimicrobials:  Anti-infectives (From admission, onward)    Start     Dose/Rate Route Frequency Ordered Stop   11/10/24 1000  cefTRIAXone  (ROCEPHIN ) 2 g in sodium chloride  0.9 % 100 mL IVPB        2 g 200 mL/hr over 30 Minutes Intravenous Every 24 hours 11/09/24 1125     11/09/24 1045  cefTRIAXone  (ROCEPHIN ) 1 g in sodium chloride  0.9 % 100 mL IVPB  Status:  Discontinued        1 g 200 mL/hr over 30 Minutes Intravenous Every 24 hours 11/09/24 1037 11/09/24 1125   11/09/24 1045  azithromycin  (ZITHROMAX ) 500 mg in sodium chloride  0.9 % 250 mL IVPB        500 mg 250 mL/hr over 60 Minutes Intravenous Every 24 hours 11/09/24 1037     11/09/24 0400  aztreonam  (AZACTAM ) 2 g in sodium chloride  0.9 % 100 mL IVPB        2 g 200 mL/hr over 30 Minutes Intravenous  Once 11/09/24 0353 11/09/24 0442   11/09/24 0400  vancomycin  (VANCOCIN ) IVPB 1000 mg/200 mL premix        1,000 mg 200 mL/hr over 60 Minutes Intravenous  Once 11/09/24 0353 11/09/24 0606       Data Reviewed: I have personally reviewed following labs and imaging studies CBC: Recent Labs  Lab 11/09/24 0356 11/10/24 0252  WBC 13.7* 9.3  NEUTROABS 12.2*  --   HGB 12.7 11.9*  HCT 38.5 35.5*  MCV 88.7 89.4  PLT 232 236   Basic Metabolic Panel: Recent Labs  Lab 11/09/24 0356 11/10/24 0252  NA 138 144  K 3.9 3.6  CL 98 103  CO2 26 28  GLUCOSE 117* 111*  BUN 36* 38*  CREATININE 1.10* 1.04*  CALCIUM  9.6 9.3   GFR: Estimated Creatinine Clearance: 30.3 mL/min (A) (by C-G formula based on SCr of  1.04 mg/dL (H)). Liver Function Tests: Recent Labs  Lab 11/09/24 0356 11/10/24 0252  AST 103* 113*  ALT 116* 148*  ALKPHOS 132* 99  BILITOT 0.4 0.4  PROT 7.9 7.0  ALBUMIN 4.1 3.7   CBG: No results for input(s): GLUCAP in the last 168 hours.  Recent Results (from the past 240 hours)  Resp panel by RT-PCR (RSV, Flu A&B, Covid) Anterior Nasal Swab     Status: None   Collection Time: 11/09/24  3:56 AM   Specimen: Anterior Nasal Swab  Result Value Ref Range Status   SARS Coronavirus 2 by RT PCR NEGATIVE NEGATIVE Final    Comment: (NOTE) SARS-CoV-2 target nucleic acids are NOT DETECTED.  The SARS-CoV-2 RNA is generally detectable in upper respiratory specimens during the acute phase of infection. The lowest concentration of SARS-CoV-2 viral copies this assay can detect is 138 copies/mL. A negative result does not preclude SARS-Cov-2 infection and should not be used as the sole basis for treatment or other  patient management decisions. A negative result may occur with  improper specimen collection/handling, submission of specimen other than nasopharyngeal swab, presence of viral mutation(s) within the areas targeted by this assay, and inadequate number of viral copies(<138 copies/mL). A negative result must be combined with clinical observations, patient history, and epidemiological information. The expected result is Negative.  Fact Sheet for Patients:  bloggercourse.com  Fact Sheet for Healthcare Providers:  seriousbroker.it  This test is no t yet approved or cleared by the [place]  FDA and  has been authorized for detection and/or diagnosis of SARS-CoV-2 by FDA under an Emergency Use Authorization (EUA). This EUA will remain  in effect (meaning this test can be used) for the duration of the COVID-19 declaration under Section 564(b)(1) of the Act, 21 U.S.C.section 360bbb-3(b)(1), unless the authorization is terminated   or revoked sooner.       Influenza A by PCR NEGATIVE NEGATIVE Final   Influenza B by PCR NEGATIVE NEGATIVE Final    Comment: (NOTE) The Xpert Xpress SARS-CoV-2/FLU/RSV plus assay is intended as an aid in the diagnosis of influenza from Nasopharyngeal swab specimens and should not be used as a sole basis for treatment. Nasal washings and aspirates are unacceptable for Xpert Xpress SARS-CoV-2/FLU/RSV testing.  Fact Sheet for Patients: bloggercourse.com  Fact Sheet for Healthcare Providers: seriousbroker.it  This test is not yet approved or cleared by the [place]  FDA and has been authorized for detection and/or diagnosis of SARS-CoV-2 by FDA under an Emergency Use Authorization (EUA). This EUA will remain in effect (meaning this test can be used) for the duration of the COVID-19 declaration under Section 564(b)(1) of the Act, 21 U.S.C. section 360bbb-3(b)(1), unless the authorization is terminated or revoked.     Resp Syncytial Virus by PCR NEGATIVE NEGATIVE Final    Comment: (NOTE) Fact Sheet for Patients: bloggercourse.com  Fact Sheet for Healthcare Providers: seriousbroker.it  This test is not yet approved or cleared by the [place]  FDA and has been authorized for detection and/or diagnosis of SARS-CoV-2 by FDA under an Emergency Use Authorization (EUA). This EUA will remain in effect (meaning this test can be used) for the duration of the COVID-19 declaration under Section 564(b)(1) of the Act, 21 U.S.C. section 360bbb-3(b)(1), unless the authorization is terminated or revoked.  Performed at [facility], [address] [place], [place] [address]   Blood Culture (routine x 2)     Status: None (Preliminary result)   Collection Time: 11/09/24  3:56 AM   Specimen: BLOOD  Result Value Ref Range Status   Specimen Description BLOOD LEFT ANTECUBITAL   Final   Special Requests   Final    BOTTLES DRAWN AEROBIC AND ANAEROBIC Blood Culture adequate volume   Culture   Final    NO GROWTH 1 DAY Performed at [facility], [address], [place], [place] [address]    Report Status PENDING  Incomplete  Blood Culture (routine x 2)     Status: None (Preliminary result)   Collection Time: 11/09/24  3:57 AM   Specimen: BLOOD  Result Value Ref Range Status   Specimen Description BLOOD BLOOD LEFT ARM  Final   Special Requests   Final    BOTTLES DRAWN AEROBIC AND ANAEROBIC Blood Culture adequate volume   Culture   Final    NO GROWTH 1 DAY Performed at [facility], [address], [place], [place] [address]    Report Status PENDING  Incomplete  MRSA Next Gen by PCR,  Nasal     Status: Abnormal   Collection Time: 11/10/24 12:40 PM   Specimen: Nasal Mucosa; Nasal Swab  Result Value Ref Range Status   MRSA by PCR Next Gen DETECTED (A) NOT DETECTED Final    Comment: RESULT CALLED TO, READ BACK BY AND VERIFIED WITH: PRAWEENA BHUSAL RN AT 1532 ON 11/10/2024 BY KC (NOTE) The GeneXpert MRSA Assay (FDA approved for NASAL specimens only), is one component of a comprehensive MRSA colonization surveillance program. It is not intended to diagnose MRSA infection nor to guide or monitor treatment for MRSA infections. Test performance is not FDA approved in patients less than 31 years old. Performed at [facility], [address] [place], [place] [address]   Expectorated Sputum Assessment w Gram Stain, Rflx to Resp Cult     Status: None   Collection Time: 11/10/24  2:30 PM   Specimen: Sputum  Result Value Ref Range Status   Specimen Description SPUTUM  Final   Special Requests NONE  Final   Sputum evaluation   Final    THIS SPECIMEN IS ACCEPTABLE FOR SPUTUM CULTURE Performed at [facility], [address] [place], [place] [address]    Report Status 11/10/2024 FINAL  Final     Radiology Studies: DG Chest  1 View Result Date: 11/10/2024 CLINICAL DATA:  Shortness of breath EXAM: CHEST  1 VIEW COMPARISON:  11/10/2024, 11/09/2024, 01/28/2023 FINDINGS: Stable cardiomediastinal silhouette with aortic atherosclerosis. New heterogeneous airspace disease within the right thorax compared to prior. Patchy airspace disease left lung base. Stable/chronic rim calcification in the left upper quadrant/spleen. IMPRESSION: New heterogeneous airspace disease within the right thorax and left lung base, suspicious for multifocal pneumonia. Underlying cardiomegaly and diffuse interstitial opacities suggestive of edema. Possible small right-sided pleural effusion Electronically Signed   By: Luke Bun M.D.   On: 11/10/2024 16:58   DG Chest 1 View Result Date: 11/10/2024 CLINICAL DATA:  CHF. EXAM: CHEST  1 VIEW COMPARISON:  1224.5 FINDINGS: The cardio pericardial silhouette is enlarged. Vascular congestion diffuse interstitial opacity is similar to minimally progressive in the interval. No substantial pleural effusion. Rim calcified splenic lesion again noted. Telemetry leads overlie the chest. IMPRESSION: Similar to minimally progressive diffuse interstitial opacity suggesting edema. Electronically Signed   By: Camellia Candle M.D.   On: 11/10/2024 07:14   US  Abdomen Limited RUQ (LIVER/GB) Result Date: 11/09/2024 CLINICAL DATA:  Elevated liver function tests EXAM: ULTRASOUND ABDOMEN LIMITED RIGHT UPPER QUADRANT COMPARISON:  August 26, 2021 FINDINGS: Gallbladder: Status post cholecystectomy Common bile duct: Diameter: 4 mm which is within normal limits Liver: No focal lesion identified. Increased echogenicity of hepatic parenchyma is noted suggesting hepatic steatosis. Portal vein is patent on color Doppler imaging with normal direction of blood flow towards the liver. Other: None. IMPRESSION: Status post cholecystectomy. Probable hepatic steatosis. Electronically Signed   By: Lynwood Landy Raddle M.D.   On: 11/09/2024 12:59   DG  Chest Port 1 View Result Date: 11/09/2024 EXAM: 1 VIEW(S) XRAY OF THE CHEST 11/09/2024 04:10:00 AM COMPARISON: Portable chest 01/28/2023. CLINICAL HISTORY: Questionable sepsis - evaluate for abnormality. FINDINGS: LUNGS AND PLEURA: There is streaky opacity in the retrocardiac left lower lobe, findings are similar to the prior study and could represent scarring, chronic atelectasis, pneumonia or aspiration. The lungs are otherwise clear. No pleural effusion. No pneumothorax. HEART AND MEDIASTINUM: There is tortuosity and atherosclerosis of the aorta with a stable mediastinum. No acute abnormality of the cardiac silhouette. BONES AND SOFT TISSUES: Osteopenia  and degenerative change of the thoracic spine. There is a densely calcified splenic mass in the left upper abdomen, seen previously. IMPRESSION: 1. Streaky opacity in the retrocardiac left lower lobe, similar to the prior study, which may represent scarring or chronic atelectasis, with pneumonia or aspiration not excluded. Electronically signed by: Francis Quam MD 11/09/2024 05:05 AM EST RP Workstation: [id number]    Scheduled Meds:  busPIRone   5 mg Oral TID   Chlorhexidine  Gluconate Cloth  6 each Topical Daily   citalopram   40 mg Oral Daily   famotidine   20 mg Oral Daily   fluticasone  furoate-vilanterol  1 puff Inhalation Daily   gabapentin   200 mg Oral BID   guaiFENesin   600 mg Oral BID   latanoprost   1 drop Both Eyes QHS   levothyroxine   50 mcg Oral Q0600   linaclotide   145 mcg Oral QAC breakfast   methylPREDNISolone  (SOLU-MEDROL ) injection  40 mg Intravenous Q12H   mirtazapine   7.5 mg Oral QHS   pantoprazole   40 mg Oral Daily   polyethylene glycol  17 g Oral Daily   senna  1 tablet Oral Daily   sodium chloride  HYPERTONIC  4 mL Nebulization BID   Continuous Infusions:  azithromycin  500 mg (11/10/24 1029)   cefTRIAXone  (ROCEPHIN )  IV 2 g (11/10/24 0945)   heparin  1,300 Units/hr (11/10/24 1502)     LOS: 1 day  MDM: Patient is high  risk for one or more organ failure.  They necessitate ongoing hospitalization for continued IV therapies and subsequent lab monitoring. Total time spent interpreting labs and vitals, reviewing the medical record, coordinating care amongst consultants and care team members, directly assessing and discussing care with the patient and/or family: 55 min  Laree Lock, MD [facility]  To contact the attending physician between 7A-7P please use Epic Chat. To contact the covering physician during after hours 7P-7A, please review Amion.  11/10/2024, 5:15 PM   *This document has been created with the assistance of dictation software. Please excuse typographical errors. *   "

## 2024-11-10 NOTE — Progress Notes (Signed)
 Patient ID: Helen Ramsey, female   DOB: 07-06-1933, 89 y.o.   MRN: [id number] [facility] Cardiology    SUBJECTIVE: Patient resting comfortably in bed on nasal cannula no longer on BiPAP no apparent respiratory distress.   Vitals:   11/09/24 2325 11/10/24 0502 11/10/24 0742 11/10/24 0859  BP: (!) 136/58 (!) 134/57  (!) 158/73  Pulse: 77 75  86  Resp: 16 18  14   Temp: 98.7 F (37.1 C) 98.5 F (36.9 C)  98.1 F (36.7 C)  TempSrc: Oral Oral    SpO2: 97% 97% 96% 96%  Weight:      Height:         Intake/Output Summary (Last 24 hours) at 11/10/2024 1006 Last data filed at 11/10/2024 9096 Gross per 24 hour  Intake 576.49 ml  Output 1200 ml  Net -623.51 ml      PHYSICAL EXAM  General: Well developed, well nourished, in no acute distress HEENT:  Normocephalic and atramatic Neck:  No JVD.  Lungs: Clear bilaterally to auscultation and percussion. Heart: HRRR . Normal S1 and S2 without gallops or murmurs.  Abdomen: Bowel sounds are positive, abdomen soft and non-tender  Msk:  Back normal, normal gait. Normal strength and tone for age. Extremities: No clubbing, cyanosis or edema.   Neuro: Alert and oriented X 3. Psych:  Good affect, responds appropriately   LABS: Basic Metabolic Panel: Recent Labs    11/09/24 0356 11/10/24 0252  NA 138 144  K 3.9 3.6  CL 98 103  CO2 26 28  GLUCOSE 117* 111*  BUN 36* 38*  CREATININE 1.10* 1.04*  CALCIUM  9.6 9.3   Liver Function Tests: Recent Labs    11/09/24 0356 11/10/24 0252  AST 103* 113*  ALT 116* 148*  ALKPHOS 132* 99  BILITOT 0.4 0.4  PROT 7.9 7.0  ALBUMIN 4.1 3.7   No results for input(s): LIPASE, AMYLASE in the last 72 hours. CBC: Recent Labs    11/09/24 0356 11/10/24 0252  WBC 13.7* 9.3  NEUTROABS 12.2*  --   HGB 12.7 11.9*  HCT 38.5 35.5*  MCV 88.7 89.4  PLT 232 236   Cardiac Enzymes: No results for input(s): CKTOTAL, CKMB, CKMBINDEX, TROPONINI in the last 72 hours. BNP: Invalid input(s):  POCBNP D-Dimer: No results for input(s): DDIMER in the last 72 hours. Hemoglobin A1C: No results for input(s): HGBA1C in the last 72 hours. Fasting Lipid Panel: Recent Labs    11/09/24 0356  CHOL 196  HDL 43  LDLCALC 123*  TRIG 147  CHOLHDL 4.5   Thyroid  Function Tests: No results for input(s): TSH, T4TOTAL, T3FREE, THYROIDAB in the last 72 hours.  Invalid input(s): FREET3 Anemia Panel: No results for input(s): VITAMINB12, FOLATE, FERRITIN, TIBC, IRON, RETICCTPCT in the last 72 hours.  DG Chest 1 View Result Date: 11/10/2024 CLINICAL DATA:  CHF. EXAM: CHEST  1 VIEW COMPARISON:  1224.5 FINDINGS: The cardio pericardial silhouette is enlarged. Vascular congestion diffuse interstitial opacity is similar to minimally progressive in the interval. No substantial pleural effusion. Rim calcified splenic lesion again noted. Telemetry leads overlie the chest. IMPRESSION: Similar to minimally progressive diffuse interstitial opacity suggesting edema. Electronically Signed   By: Camellia Candle M.D.   On: 11/10/2024 07:14   US  Abdomen Limited RUQ (LIVER/GB) Result Date: 11/09/2024 CLINICAL DATA:  Elevated liver function tests EXAM: ULTRASOUND ABDOMEN LIMITED RIGHT UPPER QUADRANT COMPARISON:  August 26, 2021 FINDINGS: Gallbladder: Status post cholecystectomy Common bile duct: Diameter: 4 mm which is within normal limits  Liver: No focal lesion identified. Increased echogenicity of hepatic parenchyma is noted suggesting hepatic steatosis. Portal vein is patent on color Doppler imaging with normal direction of blood flow towards the liver. Other: None. IMPRESSION: Status post cholecystectomy. Probable hepatic steatosis. Electronically Signed   By: Lynwood Landy Raddle M.D.   On: 11/09/2024 12:59   DG Chest Port 1 View Result Date: 11/09/2024 EXAM: 1 VIEW(S) XRAY OF THE CHEST 11/09/2024 04:10:00 AM COMPARISON: Portable chest 01/28/2023. CLINICAL HISTORY: Questionable sepsis -  evaluate for abnormality. FINDINGS: LUNGS AND PLEURA: There is streaky opacity in the retrocardiac left lower lobe, findings are similar to the prior study and could represent scarring, chronic atelectasis, pneumonia or aspiration. The lungs are otherwise clear. No pleural effusion. No pneumothorax. HEART AND MEDIASTINUM: There is tortuosity and atherosclerosis of the aorta with a stable mediastinum. No acute abnormality of the cardiac silhouette. BONES AND SOFT TISSUES: Osteopenia and degenerative change of the thoracic spine. There is a densely calcified splenic mass in the left upper abdomen, seen previously. IMPRESSION: 1. Streaky opacity in the retrocardiac left lower lobe, similar to the prior study, which may represent scarring or chronic atelectasis, with pneumonia or aspiration not excluded. Electronically signed by: Francis Quam MD 11/09/2024 05:05 AM EST RP Workstation: [id number]     Echo EF 50 to 55%  TELEMETRY: Sinus rhythm rate of 90 nonspecific ST-T changes:  ASSESSMENT AND PLAN:  Principal Problem:   Pneumonia Active Problems:   CHF (congestive heart failure) (HCC)   PNA (pneumonia)   NSTEMI (non-ST elevated myocardial infarction) (HCC)    Plan Acute on chronic respiratory failure secondary to COPD pneumonia hypoxemia continue BiPAP supplemental oxygen  antibiotics steroids respiratory support Asthma COPD exacerbation continue inhalers steroids supplemental oxygen  incentive spirometry Obesity recommend modest weight loss exercise portion control Healthcare acquired pneumonia continue broad-spectrum antibiotic therapy respiratory support supplemental oxygen  Elevated troponins consistent with demand ischemia not a non-STEMI ACS or myocardial injury non-MI recommend conservative therapy HFpEF by history elevated BNP currently receiving Lasix  therapy preserved left ventricular function around 50 to 55% continue current Renal insufficiency stage III continue conservative therapy  maintain adequate renal perfusion consider nephrology input Hypertension reasonably controlled continue current therapy Heparin  for DVT history consider transitioning to Eliquis  History of rheumatoid arthritis was on methotrexate  outpatient follow-up with rheumatology    Cara JONETTA Lovelace, MD,  11/10/2024 10:06 AM

## 2024-11-10 NOTE — Progress Notes (Signed)
 ANTICOAGULATION CONSULT NOTE  Pharmacy Consult for heparin  infusion Indication: NSTEMI  Allergies[1]  Patient Measurements: Height: 4' 11 (149.9 cm) Weight: 72.4 kg (159 lb 9.8 oz) IBW/kg (Calculated) : 43.2 HEPARIN  DW (KG): 59.5  Vital Signs: Temp: 98.5 F (36.9 C) (12/25 0502) Temp Source: Oral (12/25 0502) BP: 134/57 (12/25 0502) Pulse Rate: 75 (12/25 0502)  Labs: Recent Labs    11/09/24 0356 11/09/24 1518 11/10/24 0252  HGB 12.7  --  11.9*  HCT 38.5  --  35.5*  PLT 232  --  236  APTT 33  --   --   LABPROT 13.9  --   --   INR 1.0  --   --   HEPARINUNFRC <0.10* <0.10* 0.25*  CREATININE 1.10*  --  1.04*    Estimated Creatinine Clearance: 31.8 mL/min (A) (by C-G formula based on SCr of 1.04 mg/dL (H)).   Medical History: Past Medical History:  Diagnosis Date   Abnormal SPEP    Allergic rhinitis, cause unspecified    Allergic rhinitis, cause unspecified    Arthritis    Atherosclerosis of aorta    Back pain    lumbar   Degeneration of lumbar or lumbosacral intervertebral disc    Diverticulitis of colon (without mention of hemorrhage)(562.11)    Dizziness and giddiness    Dysthymic disorder    Headache(784.0)    Myalgia and myositis, unspecified    Osteoporosis, unspecified    Other and unspecified hyperlipidemia    Other diseases of nasal cavity and sinuses(478.19)    Other specified disorders of bladder    Syncope    Trigger finger (acquired)    Unspecified essential hypertension    Unspecified sleep apnea    Urinary frequency     Medications:  PTA Meds: Hx of Apixaban  5 mg BID for DVT in March 2024, but last dose unknown and no recent dispense hx.  Assessment: Pt is a 89 yo female presenting to ED c/o respiratory distress, found with elevated BNP and Troponin level, trending up. PMH notable for HFpEF, CKD 3b, and DVT in 2024 s/p Eliquis .  12/24 Baseline hgb 12.7, plt 232, aPTT 33, and HL <0.1. Tropnin continuing to trend up 496.  Goal of  Therapy:  heparin  level 0.3-0.7 units/ml Monitor platelets by anticoagulation protocol: Yes   Date    Time    aPTT    Rate/comment 12/24   1518    <0.10   SUBtherapeutic @ 750 units/hr 12/25 0252 0.25 Subtherapeutic  Plan: heparin  level remains subtherapeutic despite recent rate increases Will bolus 1800 units IV heparin  and increase rate to 1300 units/hr Will check heparin  level in 8 hrs after rate change CBC daily while on heparin    Adriana Bolster, PharmD, BCPS 11/10/2024 7:13 AM          [1]  Allergies Allergen Reactions   Amoxicillin    Clindamycin/Lincomycin    Doxycycline    Levaquin [Levofloxacin In D5w]    Mirabegron Nausea And Vomiting   Nsaids Other (See Comments)    GI upset   Oxycodone  Nausea Only    Other reaction(s): Hallucination   Phenergan [Promethazine Hcl]    Singulair [Montelukast]    Vicodin [Hydrocodone-Acetaminophen ]    Zocor [Simvastatin]    Zoloft  [Sertraline] Other (See Comments)   Ampicillin Rash   Bactrim [Sulfamethoxazole-Trimethoprim] Rash   Levofloxacin Other (See Comments)    Weakness   Penicillins Rash    Ampicillin, Clindamycin, Doxycycline Levaquin-severe headache, muscle and joint aches, weak  Tolmetin     Other reaction(s): Other (See Comments) GI upset

## 2024-11-10 NOTE — Progress Notes (Signed)
 ANTICOAGULATION CONSULT NOTE  Pharmacy Consult for heparin  infusion Indication: NSTEMI  Allergies[1]  Patient Measurements: Height: 4' 11 (149.9 cm) Weight: 72.4 kg (159 lb 9.8 oz) IBW/kg (Calculated) : 43.2 HEPARIN  DW (KG): 59.5  Vital Signs: Temp: 98.7 F (37.1 C) (12/24 2325) Temp Source: Oral (12/24 2325) BP: 136/58 (12/24 2325) Pulse Rate: 77 (12/24 2325)  Labs: Recent Labs    11/09/24 0356 11/09/24 1518 11/10/24 0252  HGB 12.7  --  11.9*  HCT 38.5  --  35.5*  PLT 232  --  236  APTT 33  --   --   LABPROT 13.9  --   --   INR 1.0  --   --   HEPARINUNFRC <0.10* <0.10* 0.25*  CREATININE 1.10*  --  1.04*    Estimated Creatinine Clearance: 31.8 mL/min (A) (by C-G formula based on SCr of 1.04 mg/dL (H)).   Medical History: Past Medical History:  Diagnosis Date   Abnormal SPEP    Allergic rhinitis, cause unspecified    Allergic rhinitis, cause unspecified    Arthritis    Atherosclerosis of aorta    Back pain    lumbar   Degeneration of lumbar or lumbosacral intervertebral disc    Diverticulitis of colon (without mention of hemorrhage)(562.11)    Dizziness and giddiness    Dysthymic disorder    Headache(784.0)    Myalgia and myositis, unspecified    Osteoporosis, unspecified    Other and unspecified hyperlipidemia    Other diseases of nasal cavity and sinuses(478.19)    Other specified disorders of bladder    Syncope    Trigger finger (acquired)    Unspecified essential hypertension    Unspecified sleep apnea    Urinary frequency     Medications:  PTA Meds: Hx of Apixaban  5 mg BID for DVT in March 2024, but last dose unknown and no recent dispense hx.  Assessment: Pt is a 89 yo female presenting to ED c/o respiratory distress, found with elevated BNP and Troponin level, trending up. PMH notable for HFpEF, CKD 3b, and DVT in 2024 s/p Eliquis .  12/24 Baseline hgb 12.7, plt 232, aPTT 33, and HL <0.1. Tropnin continuing to trend up 496.  Goal of  Therapy:  Heparin  level 0.3-0.7 units/ml aPTT 66-102 seconds Monitor platelets by anticoagulation protocol: Yes   Date    Time    aPTT    Rate/comment 12/24   1518    <0.10   SUBtherapeutic @ 750 units/hr 12/25 0252 0.25 Subtherapeutic  Plan:  Bolus 900 units and increase rate to 1050 units/hr Will check HL in 8 hr after rate change HL & CBC daily while on heparin   Rankin CANDIE Dills, PharmD, [facility] 11/10/2024 4:12 AM         [1]  Allergies Allergen Reactions   Amoxicillin    Clindamycin/Lincomycin    Doxycycline    Levaquin [Levofloxacin In D5w]    Mirabegron Nausea And Vomiting   Nsaids Other (See Comments)    GI upset   Oxycodone  Nausea Only    Other reaction(s): Hallucination   Phenergan [Promethazine Hcl]    Singulair [Montelukast]    Vicodin [Hydrocodone-Acetaminophen ]    Zocor [Simvastatin]    Zoloft  [Sertraline] Other (See Comments)   Ampicillin Rash   Bactrim [Sulfamethoxazole-Trimethoprim] Rash   Levofloxacin Other (See Comments)    Weakness   Penicillins Rash    Ampicillin, Clindamycin, Doxycycline Levaquin-severe headache, muscle and joint aches, weak   Tolmetin     Other  reaction(s): Other (See Comments) GI upset

## 2024-11-10 NOTE — Significant Event (Signed)
 Rapid response was called around 4:20 PM as patient was found to have SOB, and desaturating 60 to 70% Patient was placed on NRB 15 L, with sats 75%  On exam: General: Increased work of breathing, mild acute distress Lungs: Diffuse crackles bilaterally, diffuse wheezing present Neuro: AAO x 3, no gross focal deficit  CXR stat which shows evidence of multifocal pneumonia, diffuse interstitial opacities suggestive of pulmonary vascular congestion, small right-sided pleural effusion MRSA nares +  Will add IV vancomycin  to cefepime  and Flagyl IV Lasix  80 mg stat Continue IV Solu-Medrol , DuoNebs as needed Placed on BiPAP due to hypoxia and increased work of breathing Will get ABG, RVP pending Hypertonic saline nebs Will moved to stepdown unit for close monitoring

## 2024-11-10 NOTE — Progress Notes (Signed)
 Pharmacy Antibiotic Note  Helen Ramsey is a 89 y.o. female admitted on 11/09/2024 with pneumonia.  Pharmacy has been consulted for vancomycin  dosing.  Plan: Give vancomycin  load of 1500 mg IV x1 Start vancomycin  750 mg IV every 24 hours (eAUC 417, Scr 1.04, IBW used, Vd 0.72 L/kg) Continue to monitor renal function, clinical status, culture data, and antibiotic LOT  Height: 4' 11 (149.9 cm) Weight: 66.2 kg (145 lb 15.1 oz) IBW/kg (Calculated) : 43.2  Temp (24hrs), Avg:98.2 F (36.8 C), Min:97.9 F (36.6 C), Max:98.7 F (37.1 C)  Recent Labs  Lab 11/09/24 0356 11/09/24 1020 11/10/24 0252 11/10/24 1033 11/10/24 1544  WBC 13.7*  --  9.3  --   --   CREATININE 1.10*  --  1.04*  --   --   LATICACIDVEN 2.1* 2.5*  --  4.1* 2.3*    Estimated Creatinine Clearance: 30.3 mL/min (A) (by C-G formula based on SCr of 1.04 mg/dL (H)).    Allergies[1]  Antimicrobials this admission: azithromycin  12/24 >>  ceftriaxone  12/24 >>  Vancomycin  12/25 >> Aztreonam  12/24 x1  Dose adjustments this admission: N/A  Microbiology results: 12/24 BCx: NGTD 12/25 Sputum: pending  12/25 MRSA PCR: positive  Thank you for involving pharmacy in this patient's care.   Damien Napoleon, PharmD Clinical Pharmacist 11/10/2024 5:46 PM    [1]  Allergies Allergen Reactions   Amoxicillin    Clindamycin/Lincomycin    Doxycycline    Levaquin [Levofloxacin In D5w]    Mirabegron Nausea And Vomiting   Nsaids Other (See Comments)    GI upset   Oxycodone  Nausea Only    Other reaction(s): Hallucination   Phenergan [Promethazine Hcl]    Singulair [Montelukast]    Vicodin [Hydrocodone-Acetaminophen ]    Zocor [Simvastatin]    Zoloft  [Sertraline] Other (See Comments)   Ampicillin Rash   Bactrim [Sulfamethoxazole-Trimethoprim] Rash   Levofloxacin Other (See Comments)    Weakness   Penicillins Rash    Ampicillin, Clindamycin, Doxycycline Levaquin-severe headache, muscle and joint aches, weak    Tolmetin     Other reaction(s): Other (See Comments) GI upset

## 2024-11-10 NOTE — Progress Notes (Signed)
 Rapid Response Event Note   Reason for Call : Respiratory distress, desat   Initial Focused Assessment: On my arrival pt is on 100% nonrebreather with O2 sat of 75%. RT at bedside and says pt's O2 sats previously in the 60s. Pt has increased work of breathing with audible crackles and wheezing. Pt is alert and oriented but HOH. Dr. Jerelene at bedside. Chest xray complete.   Interventions: 80 mg IV lasix  given per Dr. Jerelene. RT placed pt on bipap. Pt continuing to pull bipap off wanting to spit after coughing up pink tinged sputum. Initially, pt RR 50. Pt did urinate after lasix  given, purewick in place. Stepdown orders placed.    Plan of Care: Pt transferred to ICU 11. Pt remains on 60% bipap. Pt's breathing much improved and pt states she is more comfortable. RR 19. All other VSS. Family at bedside.    Event Summary:   MD Notified: Dr. Jerelene Call Time: 1623 Arrival Time: 1624 End Time: Transferred to ICU 11  Duwaine LOISE Cools, RN

## 2024-11-10 NOTE — Progress Notes (Signed)
" °   11/10/24 1623  Spiritual Encounters  Type of Visit Initial  Care provided to: Pt and family (Son and Daughter in [place] at bedside)  [place] partners present during [employer];Other (comment) (Rapid Response Team)  Referral source Code page  Reason for visit Code  OnCall Visit Yes  Spiritual Framework  Presenting Themes Goals in life/care;Coping tools;Impactful experiences and emotions;Other (comment) (for Family)  Interventions  Spiritual Care Interventions Made Established relationship of care and support;Compassionate presence;Reflective listening;Encouragement;Other (comment) (for Family)  Intervention Outcomes  Outcomes Connection to spiritual care;Connection to values and goals of care;Awareness of health;Awareness of support;Other (comment) (for Family)    "

## 2024-11-10 NOTE — Progress Notes (Addendum)
 Patient transported from 2A to ICU on BiPAP with no complications.  Vital signs and O2 saturation remained stable throughout.

## 2024-11-11 ENCOUNTER — Inpatient Hospital Stay: Payer: Medicare (Managed Care)

## 2024-11-11 ENCOUNTER — Inpatient Hospital Stay
Admit: 2024-11-11 | Discharge: 2024-11-11 | Disposition: A | Discharge status: Home / Self Care | Payer: Medicare (Managed Care)

## 2024-11-11 DIAGNOSIS — J189 Pneumonia, unspecified organism: Secondary | ICD-10-CM

## 2024-11-11 DIAGNOSIS — I5023 Acute on chronic systolic (congestive) heart failure: Secondary | ICD-10-CM

## 2024-11-11 LAB — RESPIRATORY PANEL BY PCR

## 2024-11-11 LAB — BASIC METABOLIC PANEL WITH GFR
Anion gap: 12 (ref 5–15)
BUN: 30 mg/dL — ABNORMAL HIGH (ref 8–23)
CO2: 29 mmol/L (ref 22–32)
Calcium: 9 mg/dL (ref 8.9–10.3)
Chloride: 100 mmol/L (ref 98–111)
Creatinine, Ser: 1.02 mg/dL — ABNORMAL HIGH (ref 0.44–1.00)
GFR, Estimated: 52 mL/min — ABNORMAL LOW
Glucose, Bld: 170 mg/dL — ABNORMAL HIGH (ref 70–99)
Potassium: 3.2 mmol/L — ABNORMAL LOW (ref 3.5–5.1)
Sodium: 141 mmol/L (ref 135–145)

## 2024-11-11 LAB — BLOOD GAS, ARTERIAL
Acid-Base Excess: 8.5 mmol/L — ABNORMAL HIGH (ref 0.0–2.0)
Bicarbonate: 31.8 mmol/L — ABNORMAL HIGH (ref 20.0–28.0)
O2 Content: 6 L/min
O2 Saturation: 98.8 %
Patient temperature: 37
pCO2 arterial: 38 mmHg (ref 32–48)
pH, Arterial: 7.53 — ABNORMAL HIGH (ref 7.35–7.45)
pO2, Arterial: 106 mmHg (ref 83–108)

## 2024-11-11 LAB — ECHOCARDIOGRAM COMPLETE
AR max vel: 2.09 cm2
AV Area VTI: 2.11 cm2
AV Area mean vel: 2.03 cm2
AV Mean grad: 3 mmHg
AV Peak grad: 6.1 mmHg
Ao pk vel: 1.23 m/s
Area-P 1/2: 3.72 cm2
Calc EF: 42.2 %
Height: 59 in
MV VTI: 1.5 cm2
P 1/2 time: 387 ms
S' Lateral: 2.3 cm
Single Plane A2C EF: 30.2 %
Single Plane A4C EF: 49.5 %
Weight: 2377.44 [oz_av]

## 2024-11-11 LAB — CBC
HCT: 38 % (ref 36.0–46.0)
Hemoglobin: 12.9 g/dL (ref 12.0–15.0)
MCH: 30.1 pg (ref 26.0–34.0)
MCHC: 33.9 g/dL (ref 30.0–36.0)
MCV: 88.6 fL (ref 80.0–100.0)
Platelets: 262 K/uL (ref 150–400)
RBC: 4.29 MIL/uL (ref 3.87–5.11)
RDW: 15.1 % (ref 11.5–15.5)
WBC: 14.2 K/uL — ABNORMAL HIGH (ref 4.0–10.5)
nRBC: 0 % (ref 0.0–0.2)

## 2024-11-11 LAB — MAGNESIUM: Magnesium: 2.1 mg/dL (ref 1.7–2.4)

## 2024-11-11 LAB — TROPONIN T, HIGH SENSITIVITY
Troponin T High Sensitivity: 269 ng/L (ref 0–19)
Troponin T High Sensitivity: 237 ng/L (ref 0–19)

## 2024-11-11 LAB — HEPARIN LEVEL (UNFRACTIONATED): Heparin Unfractionated: 0.32 [IU]/mL (ref 0.30–0.70)

## 2024-11-11 MED ORDER — POTASSIUM CHLORIDE CRYS ER 20 MEQ PO TBCR
40.0000 meq | EXTENDED_RELEASE_TABLET | ORAL | Status: AC
  Administered 2024-11-11 (×2): 40 meq via ORAL
  Filled 2024-11-11 (×2): qty 2

## 2024-11-11 MED ORDER — PERFLUTREN LIPID MICROSPHERE
1.0000 mL | INTRAVENOUS | Status: AC | PRN
  Administered 2024-11-11: 2 mL via INTRAVENOUS

## 2024-11-11 MED ORDER — VANCOMYCIN HCL 1250 MG/250ML IV SOLN
1250.0000 mg | INTRAVENOUS | Status: DC
  Administered 2024-11-12: 1250 mg via INTRAVENOUS
  Filled 2024-11-11: qty 250

## 2024-11-11 MED ORDER — CHLORHEXIDINE GLUCONATE CLOTH 2 % EX PADS
6.0000 | MEDICATED_PAD | Freq: Every day | CUTANEOUS | Status: DC
  Administered 2024-11-12: 6 via TOPICAL

## 2024-11-11 NOTE — Progress Notes (Signed)
 ANTICOAGULATION CONSULT NOTE  Pharmacy Consult for heparin  infusion Indication: NSTEMI  Allergies[1]  Patient Measurements: Height: 4' 11 (149.9 cm) Weight: 66.2 kg (145 lb 15.1 oz) IBW/kg (Calculated) : 43.2 HEPARIN  DW (KG): 57.7  Vital Signs: Temp: 98.5 F (36.9 C) (12/26 0000) Temp Source: Oral (12/26 0000) BP: 134/64 (12/26 0400) Pulse Rate: 88 (12/26 0400)  Labs: Recent Labs    11/09/24 0356 11/09/24 1518 11/10/24 0252 11/10/24 1227 11/10/24 2156 11/11/24 0428  HGB 12.7  --  11.9*  --   --  12.9  HCT 38.5  --  35.5*  --   --  38.0  PLT 232  --  236  --   --  262  APTT 33  --   --   --   --   --   LABPROT 13.9  --   --   --   --   --   INR 1.0  --   --   --   --   --   HEPARINUNFRC <0.10*   < > 0.25* <0.10* 0.33 0.32  CREATININE 1.10*  --  1.04*  --   --  1.02*   < > = values in this interval not displayed.    Estimated Creatinine Clearance: 30.9 mL/min (A) (by C-G formula based on SCr of 1.02 mg/dL (H)).   Medical History: Past Medical History:  Diagnosis Date   Abnormal SPEP    Allergic rhinitis, cause unspecified    Allergic rhinitis, cause unspecified    Arthritis    Atherosclerosis of aorta    Back pain    lumbar   Degeneration of lumbar or lumbosacral intervertebral disc    Diverticulitis of colon (without mention of hemorrhage)(562.11)    Dizziness and giddiness    Dysthymic disorder    Headache(784.0)    Myalgia and myositis, unspecified    Osteoporosis, unspecified    Other and unspecified hyperlipidemia    Other diseases of nasal cavity and sinuses(478.19)    Other specified disorders of bladder    Syncope    Trigger finger (acquired)    Unspecified essential hypertension    Unspecified sleep apnea    Urinary frequency     Medications:  PTA Meds: Hx of Apixaban  5 mg BID for DVT in March 2024, but last dose unknown and no recent dispense hx.  Assessment: Pt is a 89 yo female presenting to ED c/o respiratory distress, found with  elevated BNP and Troponin level, trending up. PMH notable for HFpEF, CKD 3b, and DVT in 2024 s/p Eliquis .  12/24 Baseline hgb 12.7, plt 232, aPTT 33, and HL <0.1. Tropnin continuing to trend up 496.  Goal of Therapy:  heparin  level 0.3-0.7 units/ml Monitor platelets by anticoagulation protocol: Yes   Date    Time    aPTT    Rate/comment 12/24   1518    <0.10   SUBtherapeutic @ 750 units/hr 12/25 0252 0.25 Subtherapeutic 12/25  2159 0.33 Therapeutic x 1 12/26 0428 0.32 Therapeutic x 2  Plan:  Will continue heparin  infusion rate at 1300 units/hr Will check heparin  level daily w/ AM labs while therapeutic CBC daily while on heparin   Helen Ramsey, PharmD, [facility] 11/11/2024 5:12 AM            [1]  Allergies Allergen Reactions   Amoxicillin    Clindamycin/Lincomycin    Doxycycline    Levaquin [Levofloxacin In D5w]    Mirabegron Nausea And Vomiting   Nsaids Other (See Comments)  GI upset   Oxycodone  Nausea Only    Other reaction(s): Hallucination   Phenergan [Promethazine Hcl]    Singulair [Montelukast]    Vicodin [Hydrocodone-Acetaminophen ]    Zocor [Simvastatin]    Zoloft  [Sertraline] Other (See Comments)   Ampicillin Rash   Bactrim [Sulfamethoxazole-Trimethoprim] Rash   Levofloxacin Other (See Comments)    Weakness   Penicillins Rash    Ampicillin, Clindamycin, Doxycycline Levaquin-severe headache, muscle and joint aches, weak   Tolmetin     Other reaction(s): Other (See Comments) GI upset

## 2024-11-11 NOTE — Progress Notes (Signed)
 " PROGRESS NOTE    Helen Ramsey  FMW:[id number] DOB: 1933-07-08 DOA: 11/09/2024 PCP: Myrla Jon CHRISTELLA, MD  Chief Complaint  Patient presents with   Respiratory Distress    Hospital Course:  Helen Ramsey is a 89 y.o. female with medical history significant of chronic HFpEF, HTN, chronic hypoxic respiratory failure on 2 L continuously, CKD stage IIIb, RA on methotrexate , presented with worsening of cough shortness of breath and fever.  Patient has been having productive cough for about 1 week, started to have yellow sputum production and dyspnea on exertion.  Patient was admitted due to acute on chronic hypoxic respiratory failure, multifocal pneumonia,  Hospital course as below  Subjective: Patient was examined at the bedside, off BiPAP slightly doing better today Answering questions appropriately but appears to be very lethargic Repeat ABG with no CO2 narcosis Discussed plan of care with son at the bedside and answered all questions   Objective: Vitals:   11/11/24 1500 11/11/24 1600 11/11/24 1800 11/11/24 1841  BP: (!) 147/60 125/80 (!) 88/74 (!) 146/112  Pulse: 79 79 82 81  Resp: (!) 26 (!) 21 (!) 25 20  Temp:  97.6 F (36.4 C)    TempSrc:  Axillary    SpO2: 96% 96% 93% 93%  Weight:      Height:        Intake/Output Summary (Last 24 hours) at 11/11/2024 1845 Last data filed at 11/11/2024 1503 Gross per 24 hour  Intake 943.26 ml  Output 2200 ml  Net -1256.74 ml   Filed Weights   11/09/24 0357 11/10/24 1712 11/11/24 0500  Weight: 72.4 kg 66.2 kg 67.4 kg    Examination: General exam: Calm, comfortable, mild tachypnea Neck: normal, supple, no masses, no thyromegaly Respiratory: Diffuse bilateral wheezing, bibasilar crackles present Cardiovascular: Regular rate and rhythm, no murmurs / rubs / gallops.  1+ extremity edema predominantly on the left ankle. 2+ pedal pulses. No carotid bruits.  Abdomen: no tenderness, no masses palpated. No hepatosplenomegaly.  Bowel sounds positive.  Musculoskeletal: no clubbing / cyanosis. No joint deformity upper and lower extremities. Good ROM, no contractures. Normal muscle tone.  Skin: no rashes, lesions, ulcers. No induration Neurologic: No gross focal deficits  Assessment & Plan:  Acute on chronic hypoxic respiratory failure Suspect multifactorial due to multifocal pneumonia, metapneumovirus infection, asthma/COPD exacerbation, acute on chronic HFpEF Was on BiPAP on presentation, was able to wean to 6L HFNC Baseline on 2 L [place] supplemental oxygen   Multifocal pneumonia Metapneumovirus infection Lactic acidosis improving, Leukocytosis (on steroids) MRSA nares positive continue vancomycin , ceftriaxone , azithromycin   Legionella, mycoplasma pending Follow respiratory culture, blood culture SLP eval  Acute asthma/COPD exacerbation IV Solu-Medrol , azithromycin  Home inhalers, DuoNebs as needed Mucinex  Incentive spirometry, flutter valve, hypertonic saline nebs  Acute on chronic HFpEF BNP elevated Continue IV lasix  60mg  bid Monitor daily weight, intake/output  Lethargic, suspect due to infectious etiology ABG with no CO2 narcosis CT head with no acute abnormality.  Has pansinusitis -on broad-spectrum antibiotic Supportive care and monitor  Elevated troponin Troponin uptrending, denies chest pain Suspect demand ischemia due to pneumonia, CHF, asthma/COPD exacerbation Echo shows EF 50 to 55%, severe hypokinesis of LV, mid apical anterior septal wall, grade 1 DD, mild AR Continue IV heparin , Trend troponin Cardiology following, appreciate recs  Hypokalemia Monitor and replete as needed  CKD stage 3a/b Monitor creatinine, stable  Acute transaminitis Suspect atypical pneumonia US  abd status postcholecystectomy, probable hepatic steatosis Hold off statin Trend LFT's  HLD LDL 123, not compliant  with statins  HTN Hold amlodipine   History of DVT On heparin  drip, hold off Eliquis     Anxiety/depression Continue SSRI, Buspar   GERD Pepcid , PPI  Rheumatoid arthritis Hold Methotrexate   Obesity class I Body mass index is 32.24 kg/m. - Outpatient follow up for lifestyle modification and risk factor management  PT/OT eval   DVT prophylaxis: IV Heparin    Code Status: Limited: Do not attempt resuscitation (DNR) -DNR-LIMITED -Do Not Intubate/DNI  Disposition:  TBD  Consultants:  Cardiology  Procedures:  None  Antimicrobials:  Anti-infectives (From admission, onward)    Start     Dose/Rate Route Frequency Ordered Stop   11/12/24 2000  vancomycin  (VANCOREADY) IVPB 1250 mg/250 mL        1,250 mg 166.7 mL/hr over 90 Minutes Intravenous Every 48 hours 11/11/24 0846     11/11/24 1900  vancomycin  (VANCOREADY) IVPB 750 mg/150 mL  Status:  Discontinued        750 mg 150 mL/hr over 60 Minutes Intravenous Every 24 hours 11/10/24 1750 11/11/24 0846   11/10/24 1900  vancomycin  (VANCOREADY) IVPB 1500 mg/300 mL        1,500 mg 150 mL/hr over 120 Minutes Intravenous  Once 11/10/24 1750 11/10/24 2219   11/10/24 1000  cefTRIAXone  (ROCEPHIN ) 2 g in sodium chloride  0.9 % 100 mL IVPB        2 g 200 mL/hr over 30 Minutes Intravenous Every 24 hours 11/09/24 1125     11/09/24 1045  cefTRIAXone  (ROCEPHIN ) 1 g in sodium chloride  0.9 % 100 mL IVPB  Status:  Discontinued        1 g 200 mL/hr over 30 Minutes Intravenous Every 24 hours 11/09/24 1037 11/09/24 1125   11/09/24 1045  azithromycin  (ZITHROMAX ) 500 mg in sodium chloride  0.9 % 250 mL IVPB        500 mg 250 mL/hr over 60 Minutes Intravenous Every 24 hours 11/09/24 1037     11/09/24 0400  aztreonam  (AZACTAM ) 2 g in sodium chloride  0.9 % 100 mL IVPB        2 g 200 mL/hr over 30 Minutes Intravenous  Once 11/09/24 0353 11/09/24 0442   11/09/24 0400  vancomycin  (VANCOCIN ) IVPB 1000 mg/200 mL premix        1,000 mg 200 mL/hr over 60 Minutes Intravenous  Once 11/09/24 0353 11/09/24 0606       Data Reviewed: I have  personally reviewed following labs and imaging studies CBC: Recent Labs  Lab 11/09/24 0356 11/10/24 0252 11/11/24 0428  WBC 13.7* 9.3 14.2*  NEUTROABS 12.2*  --   --   HGB 12.7 11.9* 12.9  HCT 38.5 35.5* 38.0  MCV 88.7 89.4 88.6  PLT 232 236 262   Basic Metabolic Panel: Recent Labs  Lab 11/09/24 0356 11/10/24 0252 11/11/24 0428  NA 138 144 141  K 3.9 3.6 3.2*  CL 98 103 100  CO2 26 28 29   GLUCOSE 117* 111* 170*  BUN 36* 38* 30*  CREATININE 1.10* 1.04* 1.02*  CALCIUM  9.6 9.3 9.0  MG  --   --  2.1   GFR: Estimated Creatinine Clearance: 31.2 mL/min (A) (by C-G formula based on SCr of 1.02 mg/dL (H)). Liver Function Tests: Recent Labs  Lab 11/09/24 0356 11/10/24 0252  AST 103* 113*  ALT 116* 148*  ALKPHOS 132* 99  BILITOT 0.4 0.4  PROT 7.9 7.0  ALBUMIN 4.1 3.7   CBG: Recent Labs  Lab 11/10/24 1716  GLUCAP 211*    Recent Results (  from the past 240 hours)  Resp panel by RT-PCR (RSV, Flu A&B, Covid) Anterior Nasal Swab     Status: None   Collection Time: 11/09/24  3:56 AM   Specimen: Anterior Nasal Swab  Result Value Ref Range Status   SARS Coronavirus 2 by RT PCR NEGATIVE NEGATIVE Final    Comment: (NOTE) SARS-CoV-2 target nucleic acids are NOT DETECTED.  The SARS-CoV-2 RNA is generally detectable in upper respiratory specimens during the acute phase of infection. The lowest concentration of SARS-CoV-2 viral copies this assay can detect is 138 copies/mL. A negative result does not preclude SARS-Cov-2 infection and should not be used as the sole basis for treatment or other patient management decisions. A negative result may occur with  improper specimen collection/handling, submission of specimen other than nasopharyngeal swab, presence of viral mutation(s) within the areas targeted by this assay, and inadequate number of viral copies(<138 copies/mL). A negative result must be combined with clinical observations, patient history, and  epidemiological information. The expected result is Negative.  Fact Sheet for Patients:  bloggercourse.com  Fact Sheet for Healthcare Providers:  seriousbroker.it  This test is no t yet approved or cleared by the [place]  FDA and  has been authorized for detection and/or diagnosis of SARS-CoV-2 by FDA under an Emergency Use Authorization (EUA). This EUA will remain  in effect (meaning this test can be used) for the duration of the COVID-19 declaration under Section 564(b)(1) of the Act, 21 U.S.C.section 360bbb-3(b)(1), unless the authorization is terminated  or revoked sooner.       Influenza A by PCR NEGATIVE NEGATIVE Final   Influenza B by PCR NEGATIVE NEGATIVE Final    Comment: (NOTE) The Xpert Xpress SARS-CoV-2/FLU/RSV plus assay is intended as an aid in the diagnosis of influenza from Nasopharyngeal swab specimens and should not be used as a sole basis for treatment. Nasal washings and aspirates are unacceptable for Xpert Xpress SARS-CoV-2/FLU/RSV testing.  Fact Sheet for Patients: bloggercourse.com  Fact Sheet for Healthcare Providers: seriousbroker.it  This test is not yet approved or cleared by the [place]  FDA and has been authorized for detection and/or diagnosis of SARS-CoV-2 by FDA under an Emergency Use Authorization (EUA). This EUA will remain in effect (meaning this test can be used) for the duration of the COVID-19 declaration under Section 564(b)(1) of the Act, 21 U.S.C. section 360bbb-3(b)(1), unless the authorization is terminated or revoked.     Resp Syncytial Virus by PCR NEGATIVE NEGATIVE Final    Comment: (NOTE) Fact Sheet for Patients: bloggercourse.com  Fact Sheet for Healthcare Providers: seriousbroker.it  This test is not yet approved or cleared by the [place]  FDA and has been  authorized for detection and/or diagnosis of SARS-CoV-2 by FDA under an Emergency Use Authorization (EUA). This EUA will remain in effect (meaning this test can be used) for the duration of the COVID-19 declaration under Section 564(b)(1) of the Act, 21 U.S.C. section 360bbb-3(b)(1), unless the authorization is terminated or revoked.  Performed at [facility], [address] [place], [place] [address]   Blood Culture (routine x 2)     Status: None (Preliminary result)   Collection Time: 11/09/24  3:56 AM   Specimen: BLOOD  Result Value Ref Range Status   Specimen Description BLOOD LEFT ANTECUBITAL  Final   Special Requests   Final    BOTTLES DRAWN AEROBIC AND ANAEROBIC Blood Culture adequate volume   Culture   Final    NO GROWTH 2 DAYS Performed at  [facility], [address], [place], [place] [address]    Report Status PENDING  Incomplete  Blood Culture (routine x 2)     Status: None (Preliminary result)   Collection Time: 11/09/24  3:57 AM   Specimen: BLOOD  Result Value Ref Range Status   Specimen Description BLOOD BLOOD LEFT ARM  Final   Special Requests   Final    BOTTLES DRAWN AEROBIC AND ANAEROBIC Blood Culture adequate volume   Culture   Final    NO GROWTH 2 DAYS Performed at [facility], [address], [place], [place] [address]    Report Status PENDING  Incomplete  Respiratory (~20 pathogens) panel by PCR     Status: Abnormal   Collection Time: 11/10/24 12:40 PM   Specimen: Nasopharyngeal Swab; Respiratory  Result Value Ref Range Status   Adenovirus NOT DETECTED NOT DETECTED Final   Coronavirus 229E NOT DETECTED NOT DETECTED Final    Comment: (NOTE) The Coronavirus on the Respiratory Panel, DOES NOT test for the novel  Coronavirus (2019 nCoV)    Coronavirus HKU1 NOT DETECTED NOT DETECTED Final   Coronavirus NL63 NOT DETECTED NOT DETECTED Final   Coronavirus OC43 NOT DETECTED NOT DETECTED Final   Metapneumovirus DETECTED (A)  NOT DETECTED Final   Rhinovirus / Enterovirus NOT DETECTED NOT DETECTED Final   Influenza A NOT DETECTED NOT DETECTED Final   Influenza B NOT DETECTED NOT DETECTED Final   Parainfluenza Virus 1 NOT DETECTED NOT DETECTED Final   Parainfluenza Virus 2 NOT DETECTED NOT DETECTED Final   Parainfluenza Virus 3 NOT DETECTED NOT DETECTED Final   Parainfluenza Virus 4 NOT DETECTED NOT DETECTED Final   Respiratory Syncytial Virus NOT DETECTED NOT DETECTED Final   Bordetella pertussis NOT DETECTED NOT DETECTED Final   Bordetella Parapertussis NOT DETECTED NOT DETECTED Final   Chlamydophila pneumoniae NOT DETECTED NOT DETECTED Final   Mycoplasma pneumoniae NOT DETECTED NOT DETECTED Final    Comment: Performed at [facility], [address] [place], [place] [address]  MRSA Next Gen by PCR, Nasal     Status: Abnormal   Collection Time: 11/10/24 12:40 PM   Specimen: Nasal Mucosa; Nasal Swab  Result Value Ref Range Status   MRSA by PCR Next Gen DETECTED (A) NOT DETECTED Final    Comment: RESULT CALLED TO, READ BACK BY AND VERIFIED WITH: PRAWEENA BHUSAL RN AT 1532 ON 11/10/2024 BY KC (NOTE) The GeneXpert MRSA Assay (FDA approved for NASAL specimens only), is one component of a comprehensive MRSA colonization surveillance program. It is not intended to diagnose MRSA infection nor to guide or monitor treatment for MRSA infections. Test performance is not FDA approved in patients less than 56 years old. Performed at [facility], [address] [place], [place] [address]   Expectorated Sputum Assessment w Gram Stain, Rflx to Resp Cult     Status: None   Collection Time: 11/10/24  2:30 PM   Specimen: Sputum  Result Value Ref Range Status   Specimen Description SPUTUM  Final   Special Requests NONE  Final   Sputum evaluation   Final    THIS SPECIMEN IS ACCEPTABLE FOR SPUTUM CULTURE Performed at [facility], [address] [place], [place] [address]    Report Status  11/10/2024 FINAL  Final  Culture, Respiratory w Gram Stain     Status: None (Preliminary result)   Collection Time: 11/10/24  2:30 PM   Specimen: SPU  Result Value Ref Range  Status   Specimen Description   Final    SPUTUM Performed at [facility], [address] [place], [place] [address]    Special Requests   Final    NONE Reflexed from [id number] Performed at [facility], [address] [place], [place] [address]    Gram Stain   Final    FEW WBC PRESENT, PREDOMINANTLY PMN RARE GRAM POSITIVE COCCI RARE GRAM NEGATIVE RODS    Culture   Final    CULTURE REINCUBATED FOR BETTER GROWTH Performed at [facility], [address], [place], [place] [address]    Report Status PENDING  Incomplete     Radiology Studies: CT HEAD WO CONTRAST ( ) Result Date: 11/11/2024 EXAM: CT HEAD WITHOUT CONTRAST 11/11/2024 03:22:10 PM TECHNIQUE: CT of the head was performed without the administration of intravenous contrast. Automated exposure control, iterative reconstruction, and/or weight based adjustment of the mA/kV was utilized to reduce the radiation dose to as low as reasonably achievable. COMPARISON: 09/09/2024 CLINICAL HISTORY: lethargic, encephalopathy FINDINGS: BRAIN AND VENTRICLES: No acute hemorrhage. No evidence of acute infarct. No hydrocephalus. No extra-axial collection. No mass effect or midline shift. Stable background of moderate chronic small vessel disease. Mild generalized cerebral atrophy. ORBITS: No acute abnormality. SINUSES: Moderate pansinus disease with air fluid levels throughout. SOFT TISSUES AND SKULL: No acute soft tissue abnormality. No skull fracture. Unchanged chronic defect in the anterior nasal septum. Bilateral middle ear effusions. IMPRESSION: 1. No acute intracranial abnormality. 2. Moderate pansinusitis with air-fluid levels throughout and bilateral middle ear effusions. Electronically signed by: [name] MD 11/11/2024 03:33 PM EST RP  Workstation: [id number]   ECHOCARDIOGRAM COMPLETE Result Date: 11/11/2024    ECHOCARDIOGRAM REPORT   Patient Name:   Helen Ramsey Date of Exam: 11/11/2024 Medical Rec #:  [id number]        Height:       59.0 in Accession #:    [id number]       Weight:       148.6 lb Date of Birth:  03/03/1933        BSA:          1.626 m Patient Age:    89 years         BP:           145/64 mmHg Patient Gender: F                HR:           83 bpm. Exam Location:  [facility] Procedure: 2D Echo, Cardiac Doppler, Color Doppler and Intracardiac            Opacification Agent (Both Spectral and Color Flow Doppler were            utilized during procedure). Indications:     Congestive Heart Failure I50.9  History:         Patient has prior history of Echocardiogram examinations, most                  recent 12/11/2022. CHF; Signs/Symptoms:Dyspnea.  Sonographer:     Ashley McNeely-Sloane Referring Phys:  [id number] [facility] Jakeim Sedore Diagnosing Phys: Redell Cave MD IMPRESSIONS  1. Left ventricular ejection fraction, by estimation, is 50 to 55%. The left ventricle has normal function. The left ventricle demonstrates regional wall motion abnormalities (see scoring diagram/findings for description). Left ventricular diastolic parameters are consistent with Grade I diastolic dysfunction (impaired relaxation). There is severe hypokinesis of the left ventricular, mid-apical anteroseptal  wall and apical segment.  2. Right ventricular systolic function is normal. The right ventricular size is normal.  3. The mitral valve is degenerative. No evidence of mitral valve regurgitation.  4. The aortic valve is tricuspid. Aortic valve regurgitation is mild. Aortic valve sclerosis is present, with no evidence of aortic valve stenosis. FINDINGS  Left Ventricle: Left ventricular ejection fraction, by estimation, is 50 to 55%. The left ventricle has normal function. The left ventricle demonstrates regional wall motion abnormalities. Severe hypokinesis of  the left ventricular, mid-apical anteroseptal wall and apical segment. Definity  contrast agent was given IV to delineate the left ventricular endocardial borders. The left ventricular internal cavity size was normal in size. There is no left ventricular hypertrophy. Left ventricular diastolic parameters are consistent with Grade I diastolic dysfunction (impaired relaxation). Right Ventricle: The right ventricular size is normal. No increase in right ventricular wall thickness. Right ventricular systolic function is normal. Left Atrium: Left atrial size was normal in size. Right Atrium: Right atrial size was normal in size. Pericardium: There is no evidence of pericardial effusion. Mitral Valve: The mitral valve is degenerative in appearance. Mild to moderate mitral annular calcification. No evidence of mitral valve regurgitation. MV peak gradient, 10.5 mmHg. The mean mitral valve gradient is 4.0 mmHg. Tricuspid Valve: The tricuspid valve is normal in structure. Tricuspid valve regurgitation is not demonstrated. Aortic Valve: The aortic valve is tricuspid. Aortic valve regurgitation is mild. Aortic regurgitation PHT measures 387 msec. Aortic valve sclerosis is present, with no evidence of aortic valve stenosis. Aortic valve mean gradient measures 3.0 mmHg. Aortic valve peak gradient measures 6.1 mmHg. Aortic valve area, by VTI measures 2.11 cm. Pulmonic Valve: The pulmonic valve was not well visualized. Pulmonic valve regurgitation is not visualized. Aorta: The aortic root and ascending aorta are structurally normal, with no evidence of dilitation. Venous: The inferior vena cava was not well visualized. IAS/Shunts: No atrial level shunt detected by color flow Doppler.  LEFT VENTRICLE PLAX 2D LVIDd:         4.20 cm     Diastology LVIDs:         2.30 cm     LV e' medial:    3.29 cm/s LV PW:         0.90 cm     LV E/e' medial:  21.5 LV IVS:        1.40 cm     LV e' lateral:   5.92 cm/s LVOT diam:     1.70 cm     LV  E/e' lateral: 11.9 LV SV:         49 LV SV Index:   30 LVOT Area:     2.27 cm  LV Volumes (MOD) LV vol d, MOD A2C: 54.6 ml LV vol d, MOD A4C: 76.1 ml LV vol s, MOD A2C: 38.1 ml LV vol s, MOD A4C: 38.4 ml LV SV MOD A2C:     16.5 ml LV SV MOD A4C:     76.1 ml LV SV MOD BP:      28.5 ml RIGHT VENTRICLE RV Basal diam:  3.90 cm RV Mid diam:    3.90 cm RV S prime:     11.20 cm/s TAPSE (M-mode): 1.6 cm LEFT ATRIUM             Index        RIGHT ATRIUM           Index LA diam:  4.00 cm 2.46 cm/m   RA Area:     14.40 cm LA Vol (A2C):   63.8 ml 39.25 ml/m  RA Volume:   32.00 ml  19.69 ml/m LA Vol (A4C):   44.9 ml 27.62 ml/m LA Biplane Vol: 53.9 ml 33.16 ml/m  AORTIC VALVE                    PULMONIC VALVE AV Area (Vmax):    2.09 cm     PV Vmax:        1.09 m/s AV Area (Vmean):   2.03 cm     PV Vmean:       75.700 cm/s AV Area (VTI):     2.11 cm     PV VTI:         0.191 m AV Vmax:           123.00 cm/s  PV Peak grad:   4.8 mmHg AV Vmean:          85.600 cm/s  PV Mean grad:   3.0 mmHg AV VTI:            0.231 m      RVOT Peak grad: 3 mmHg AV Peak Grad:      6.1 mmHg AV Mean Grad:      3.0 mmHg LVOT Vmax:         113.00 cm/s LVOT Vmean:        76.600 cm/s LVOT VTI:          0.215 m LVOT/AV VTI ratio: 0.93 AI PHT:            387 msec  AORTA Ao Root diam: 2.90 cm Ao Asc diam:  3.40 cm MITRAL VALVE MV Area (PHT): 3.72 cm     SHUNTS MV Area VTI:   1.50 cm     Systemic VTI:  0.22 m MV Peak grad:  10.5 mmHg    Systemic Diam: 1.70 cm MV Mean grad:  4.0 mmHg     Pulmonic VTI:  0.127 m MV Vmax:       1.62 m/s MV Vmean:      93.6 cm/s MV Decel Time: 204 msec MV E velocity: 70.60 cm/s MV A velocity: 143.00 cm/s MV E/A ratio:  0.49 Redell Cave MD Electronically signed by Redell Cave MD Signature Date/Time: 11/11/2024/1:16:57 PM    Final    DG Chest 1 View Result Date: 11/10/2024 CLINICAL DATA:  Shortness of breath EXAM: CHEST  1 VIEW COMPARISON:  11/10/2024, 11/09/2024, 01/28/2023 FINDINGS: Stable  cardiomediastinal silhouette with aortic atherosclerosis. New heterogeneous airspace disease within the right thorax compared to prior. Patchy airspace disease left lung base. Stable/chronic rim calcification in the left upper quadrant/spleen. IMPRESSION: New heterogeneous airspace disease within the right thorax and left lung base, suspicious for multifocal pneumonia. Underlying cardiomegaly and diffuse interstitial opacities suggestive of edema. Possible small right-sided pleural effusion Electronically Signed   By: Luke Bun M.D.   On: 11/10/2024 16:58   DG Chest 1 View Result Date: 11/10/2024 CLINICAL DATA:  CHF. EXAM: CHEST  1 VIEW COMPARISON:  1224.5 FINDINGS: The cardio pericardial silhouette is enlarged. Vascular congestion diffuse interstitial opacity is similar to minimally progressive in the interval. No substantial pleural effusion. Rim calcified splenic lesion again noted. Telemetry leads overlie the chest. IMPRESSION: Similar to minimally progressive diffuse interstitial opacity suggesting edema. Electronically Signed   By: Camellia Candle M.D.   On: 11/10/2024 07:14    Scheduled Meds:  busPIRone   5 mg Oral TID   [START ON 11/12/2024] Chlorhexidine  Gluconate Cloth  6 each Topical QHS   citalopram   40 mg Oral Daily   famotidine   20 mg Oral Daily   fluticasone  furoate-vilanterol  1 puff Inhalation Daily   furosemide   60 mg Intravenous BID   gabapentin   200 mg Oral BID   guaiFENesin   600 mg Oral BID   latanoprost   1 drop Both Eyes QHS   levothyroxine   50 mcg Oral Q0600   linaclotide   145 mcg Oral QAC breakfast   methylPREDNISolone  (SOLU-MEDROL ) injection  40 mg Intravenous Q12H   mirtazapine   7.5 mg Oral QHS   pantoprazole  (PROTONIX ) IV  40 mg Intravenous Q24H   polyethylene glycol  17 g Oral Daily   senna  1 tablet Oral Daily   sodium chloride  HYPERTONIC  4 mL Nebulization BID   Continuous Infusions:  azithromycin  Stopped (11/11/24 1056)   cefTRIAXone  (ROCEPHIN )  IV Stopped  (11/11/24 0944)   heparin  1,300 Units/hr (11/11/24 1503)   [START ON 11/12/2024] vancomycin        LOS: 2 days  MDM: Patient is high risk for one or more organ failure.  They necessitate ongoing hospitalization for continued IV therapies and subsequent lab monitoring. Total time spent interpreting labs and vitals, reviewing the medical record, coordinating care amongst consultants and care team members, directly assessing and discussing care with the patient and/or family: 55 min  Laree Lock, MD [facility]  To contact the attending physician between 7A-7P please use Epic Chat. To contact the covering physician during after hours 7P-7A, please review Amion.  11/11/2024, 6:45 PM   *This document has been created with the assistance of dictation software. Please excuse typographical errors. *   "

## 2024-11-11 NOTE — Evaluation (Signed)
 Occupational Therapy Evaluation Patient Details Name: Helen Ramsey MRN: [id number] DOB: 04-Dec-1932 Today's Date: 11/11/2024   History of Present Illness   Helen Ramsey is a 89 y.o. female with medical history significant of chronic HFpEF, HTN, chronic hypoxic respiratory failure on 2 L continuously, CKD stage IIIb, RA on methotrexate , presented with worsening of cough shortness of breath and fever.   Clinical Impressions Ms Stehr was seen for OT evaluation this date. Prior to hospital admission, pt was ambulatory with RW to bathroom at facility. Pt lives at [facility]. Pt currently requires MOD A exit bed. MIN A don socks in sitting. CGA + RW for bed>chair t/f and MIN A sit<>stand from chair. Pt would benefit from skilled OT to address noted impairments and functional limitations (see below for any additional details). Upon hospital discharge, recommend OT follow up.    If plan is discharge home, recommend the following:   A little help with walking and/or transfers;A little help with bathing/dressing/bathroom;Help with stairs or ramp for entrance     Functional Status Assessment   Patient has had a recent decline in their functional status and demonstrates the ability to make significant improvements in function in a reasonable and predictable amount of time.     Equipment Recommendations   BSC/3in1     Recommendations for Other Services         Precautions/Restrictions   Precautions Precautions: Fall Recall of Precautions/Restrictions: Intact Restrictions Weight Bearing Restrictions Per Provider Order: No     Mobility Bed Mobility Overal bed mobility: Needs Assistance Bed Mobility: Supine to Sit     Supine to sit: Mod assist          Transfers Overall transfer level: Needs assistance Equipment used: Rolling walker (2 wheels) Transfers: Sit to/from Stand, Bed to chair/wheelchair/BSC Sit to Stand: Min assist     Step pivot transfers:  Contact guard assist            Balance Overall balance assessment: Needs assistance Sitting-balance support: No upper extremity supported, Feet supported Sitting balance-Leahy Scale: Fair     Standing balance support: Bilateral upper extremity supported Standing balance-Leahy Scale: Fair                             ADL either performed or assessed with clinical judgement   ADL Overall ADL's : Needs assistance/impaired                                       General ADL Comments: MIN A don socks in sitting. MIN A + RW for simulated BSC t/f      Pertinent Vitals/Pain Pain Assessment Pain Assessment: No/denies pain     Extremity/Trunk Assessment Upper Extremity Assessment Upper Extremity Assessment: Overall WFL for tasks assessed   Lower Extremity Assessment Lower Extremity Assessment: Generalized weakness       Communication Communication Communication: Impaired Factors Affecting Communication: Hearing impaired   Cognition Arousal: Alert Behavior During Therapy: WFL for tasks assessed/performed Cognition: No family/caregiver present to determine baseline             OT - Cognition Comments: age appropriate cognition, difficulty attending to questions                 Following commands: Impaired Following commands impaired: Follows multi-step commands inconsistently     Cueing  General Comments  Cueing Techniques: Verbal cues  SPO2 93% on 6L [place] with activity   Exercises     Shoulder Instructions      Home Living Family/patient expects to be discharged to:: Skilled nursing facility                                        Prior Functioning/Environment Prior Level of Function : Needs assist             Mobility Comments: RW household distances, w/c out of room ADLs Comments: assist as needed    OT Problem List: Decreased strength;Decreased range of motion;Decreased activity  tolerance;Impaired balance (sitting and/or standing);Decreased safety awareness   OT Treatment/Interventions: Self-care/ADL training;Therapeutic exercise;Energy conservation;DME and/or AE instruction;Therapeutic activities;Patient/family education      OT Goals(Current goals can be found in the care plan section)   Acute Rehab OT Goals Patient Stated Goal: to go home OT Goal Formulation: With patient Time For Goal Achievement: 11/25/24 Potential to Achieve Goals: Good ADL Goals Pt Will Perform Grooming: with modified independence;standing Pt Will Perform Lower Body Dressing: with modified independence;sit to/from stand Pt Will Transfer to Toilet: with modified independence;ambulating;regular height toilet   OT Frequency:  Min 2X/week    Co-evaluation PT/OT/SLP Co-Evaluation/Treatment: Yes Reason for Co-Treatment: To address functional/ADL transfers PT goals addressed during session: Mobility/safety with mobility OT goals addressed during session: ADL's and self-care      AM-PAC OT 6 Clicks Daily Activity     Outcome Measure Help from another person eating meals?: None Help from another person taking care of personal grooming?: A Little Help from another person toileting, which includes using toliet, bedpan, or urinal?: A Little Help from another person bathing (including washing, rinsing, drying)?: A Lot Help from another person to put on and taking off regular upper body clothing?: A Little Help from another person to put on and taking off regular lower body clothing?: A Little 6 Click Score: 18   End of Session Equipment Utilized During Treatment: Rolling walker (2 wheels) Nurse Communication: Mobility status  Activity Tolerance: Patient tolerated treatment well Patient left: in chair;with call bell/phone within reach  OT Visit Diagnosis: Other abnormalities of gait and mobility (R26.89);Muscle weakness (generalized) (M62.81)                Time: 8443-8376 OT Time  Calculation (min): 27 min Charges:  OT General Charges $OT Visit: 1 Visit OT Evaluation $OT Eval Moderate Complexity: 1 Mod OT Treatments $Self Care/Home Management : 8-22 mins  Elston Slot, M.S. OTR/L  11/11/2024, 4:34 PM  ascom [phone]

## 2024-11-11 NOTE — Progress Notes (Signed)
 [facility] Cardiology    SUBJECTIVE: Patient currently on BiPAP with respiratory distress sitting up in bed in ICU   Vitals:   11/11/24 1000 11/11/24 1100 11/11/24 1200 11/11/24 1321  BP: (!) 149/58 (!) 145/64 (!) 134/55   Pulse: 93 90 81   Resp: 20 (!) 28 (!) 28   Temp:   97.8 F (36.6 C)   TempSrc:   Axillary   SpO2: 96% 97% 95% 97%  Weight:      Height:         Intake/Output Summary (Last 24 hours) at 11/11/2024 1343 Last data filed at 11/11/2024 1055 Gross per 24 hour  Intake 1419.35 ml  Output 3150 ml  Net -1730.65 ml      PHYSICAL EXAM  General: Well developed, well nourished, in no acute distress HEENT:  Normocephalic and atramatic Neck:  No JVD.  Lungs: Clear bilaterally to auscultation and percussion. Heart: HRRR . Normal S1 and S2 without gallops or murmurs.  Abdomen: Bowel sounds are positive, abdomen soft and non-tender  Msk:  Back normal, normal gait. Normal strength and tone for age. Extremities: No clubbing, cyanosis or edema.   Neuro: Alert and oriented X 3. Psych:  Good affect, responds appropriately   LABS: Basic Metabolic Panel: Recent Labs    11/10/24 0252 11/11/24 0428  NA 144 141  K 3.6 3.2*  CL 103 100  CO2 28 29  GLUCOSE 111* 170*  BUN 38* 30*  CREATININE 1.04* 1.02*  CALCIUM  9.3 9.0  MG  --  2.1   Liver Function Tests: Recent Labs    11/09/24 0356 11/10/24 0252  AST 103* 113*  ALT 116* 148*  ALKPHOS 132* 99  BILITOT 0.4 0.4  PROT 7.9 7.0  ALBUMIN 4.1 3.7   No results for input(s): LIPASE, AMYLASE in the last 72 hours. CBC: Recent Labs    11/09/24 0356 11/10/24 0252 11/11/24 0428  WBC 13.7* 9.3 14.2*  NEUTROABS 12.2*  --   --   HGB 12.7 11.9* 12.9  HCT 38.5 35.5* 38.0  MCV 88.7 89.4 88.6  PLT 232 236 262   Cardiac Enzymes: No results for input(s): CKTOTAL, CKMB, CKMBINDEX, TROPONINI in the last 72 hours. BNP: Invalid input(s): POCBNP D-Dimer: No results for input(s): DDIMER in the last 72  hours. Hemoglobin A1C: No results for input(s): HGBA1C in the last 72 hours. Fasting Lipid Panel: Recent Labs    11/09/24 0356  CHOL 196  HDL 43  LDLCALC 123*  TRIG 147  CHOLHDL 4.5   Thyroid  Function Tests: No results for input(s): TSH, T4TOTAL, T3FREE, THYROIDAB in the last 72 hours.  Invalid input(s): FREET3 Anemia Panel: No results for input(s): VITAMINB12, FOLATE, FERRITIN, TIBC, IRON, RETICCTPCT in the last 72 hours.  ECHOCARDIOGRAM COMPLETE Result Date: 11/11/2024    ECHOCARDIOGRAM REPORT   Patient Name:   Helen Ramsey Date of Exam: 11/11/2024 Medical Rec #:  [id number]        Height:       59.0 in Accession #:    [id number]       Weight:       148.6 lb Date of Birth:  04-22-1933        BSA:          1.626 m Patient Age:    89 years         BP:           145/64 mmHg Patient Gender: F  HR:           83 bpm. Exam Location:  [facility] Procedure: 2D Echo, Cardiac Doppler, Color Doppler and Intracardiac            Opacification Agent (Both Spectral and Color Flow Doppler were            utilized during procedure). Indications:     Congestive Heart Failure I50.9  History:         Patient has prior history of Echocardiogram examinations, most                  recent 12/11/2022. CHF; Signs/Symptoms:Dyspnea.  Sonographer:     Ashley McNeely-Sloane Referring Phys:  [id number] [facility]ON[facility] Diagnosing Phys: Redell Cave MD IMPRESSIONS  1. Left ventricular ejection fraction, by estimation, is 50 to 55%. The left ventricle has normal function. The left ventricle demonstrates regional wall motion abnormalities (see scoring diagram/findings for description). Left ventricular diastolic parameters are consistent with Grade I diastolic dysfunction (impaired relaxation). There is severe hypokinesis of the left ventricular, mid-apical anteroseptal wall and apical segment.  2. Right ventricular systolic function is normal. The right ventricular size is normal.  3. The  mitral valve is degenerative. No evidence of mitral valve regurgitation.  4. The aortic valve is tricuspid. Aortic valve regurgitation is mild. Aortic valve sclerosis is present, with no evidence of aortic valve stenosis. FINDINGS  Left Ventricle: Left ventricular ejection fraction, by estimation, is 50 to 55%. The left ventricle has normal function. The left ventricle demonstrates regional wall motion abnormalities. Severe hypokinesis of the left ventricular, mid-apical anteroseptal wall and apical segment. Definity  contrast agent was given IV to delineate the left ventricular endocardial borders. The left ventricular internal cavity size was normal in size. There is no left ventricular hypertrophy. Left ventricular diastolic parameters are consistent with Grade I diastolic dysfunction (impaired relaxation). Right Ventricle: The right ventricular size is normal. No increase in right ventricular wall thickness. Right ventricular systolic function is normal. Left Atrium: Left atrial size was normal in size. Right Atrium: Right atrial size was normal in size. Pericardium: There is no evidence of pericardial effusion. Mitral Valve: The mitral valve is degenerative in appearance. Mild to moderate mitral annular calcification. No evidence of mitral valve regurgitation. MV peak gradient, 10.5 mmHg. The mean mitral valve gradient is 4.0 mmHg. Tricuspid Valve: The tricuspid valve is normal in structure. Tricuspid valve regurgitation is not demonstrated. Aortic Valve: The aortic valve is tricuspid. Aortic valve regurgitation is mild. Aortic regurgitation PHT measures 387 msec. Aortic valve sclerosis is present, with no evidence of aortic valve stenosis. Aortic valve mean gradient measures 3.0 mmHg. Aortic valve peak gradient measures 6.1 mmHg. Aortic valve area, by VTI measures 2.11 cm. Pulmonic Valve: The pulmonic valve was not well visualized. Pulmonic valve regurgitation is not visualized. Aorta: The aortic root and  ascending aorta are structurally normal, with no evidence of dilitation. Venous: The inferior vena cava was not well visualized. IAS/Shunts: No atrial level shunt detected by color flow Doppler.  LEFT VENTRICLE PLAX 2D LVIDd:         4.20 cm     Diastology LVIDs:         2.30 cm     LV e' medial:    3.29 cm/s LV PW:         0.90 cm     LV E/e' medial:  21.5 LV IVS:        1.40 cm     LV e'  lateral:   5.92 cm/s LVOT diam:     1.70 cm     LV E/e' lateral: 11.9 LV SV:         49 LV SV Index:   30 LVOT Area:     2.27 cm  LV Volumes (MOD) LV vol d, MOD A2C: 54.6 ml LV vol d, MOD A4C: 76.1 ml LV vol s, MOD A2C: 38.1 ml LV vol s, MOD A4C: 38.4 ml LV SV MOD A2C:     16.5 ml LV SV MOD A4C:     76.1 ml LV SV MOD BP:      28.5 ml RIGHT VENTRICLE RV Basal diam:  3.90 cm RV Mid diam:    3.90 cm RV S prime:     11.20 cm/s TAPSE (M-mode): 1.6 cm LEFT ATRIUM             Index        RIGHT ATRIUM           Index LA diam:        4.00 cm 2.46 cm/m   RA Area:     14.40 cm LA Vol (A2C):   63.8 ml 39.25 ml/m  RA Volume:   32.00 ml  19.69 ml/m LA Vol (A4C):   44.9 ml 27.62 ml/m LA Biplane Vol: 53.9 ml 33.16 ml/m  AORTIC VALVE                    PULMONIC VALVE AV Area (Vmax):    2.09 cm     PV Vmax:        1.09 m/s AV Area (Vmean):   2.03 cm     PV Vmean:       75.700 cm/s AV Area (VTI):     2.11 cm     PV VTI:         0.191 m AV Vmax:           123.00 cm/s  PV Peak grad:   4.8 mmHg AV Vmean:          85.600 cm/s  PV Mean grad:   3.0 mmHg AV VTI:            0.231 m      RVOT Peak grad: 3 mmHg AV Peak Grad:      6.1 mmHg AV Mean Grad:      3.0 mmHg LVOT Vmax:         113.00 cm/s LVOT Vmean:        76.600 cm/s LVOT VTI:          0.215 m LVOT/AV VTI ratio: 0.93 AI PHT:            387 msec  AORTA Ao Root diam: 2.90 cm Ao Asc diam:  3.40 cm MITRAL VALVE MV Area (PHT): 3.72 cm     SHUNTS MV Area VTI:   1.50 cm     Systemic VTI:  0.22 m MV Peak grad:  10.5 mmHg    Systemic Diam: 1.70 cm MV Mean grad:  4.0 mmHg     Pulmonic VTI:  0.127  m MV Vmax:       1.62 m/s MV Vmean:      93.6 cm/s MV Decel Time: 204 msec MV E velocity: 70.60 cm/s MV A velocity: 143.00 cm/s MV E/A ratio:  0.49 Redell Cave MD Electronically signed by Redell Cave MD Signature Date/Time: 11/11/2024/1:16:57 PM    Final    DG Chest 1 View  Result Date: 11/10/2024 CLINICAL DATA:  Shortness of breath EXAM: CHEST  1 VIEW COMPARISON:  11/10/2024, 11/09/2024, 01/28/2023 FINDINGS: Stable cardiomediastinal silhouette with aortic atherosclerosis. New heterogeneous airspace disease within the right thorax compared to prior. Patchy airspace disease left lung base. Stable/chronic rim calcification in the left upper quadrant/spleen. IMPRESSION: New heterogeneous airspace disease within the right thorax and left lung base, suspicious for multifocal pneumonia. Underlying cardiomegaly and diffuse interstitial opacities suggestive of edema. Possible small right-sided pleural effusion Electronically Signed   By: Luke Bun M.D.   On: 11/10/2024 16:58   DG Chest 1 View Result Date: 11/10/2024 CLINICAL DATA:  CHF. EXAM: CHEST  1 VIEW COMPARISON:  1224.5 FINDINGS: The cardio pericardial silhouette is enlarged. Vascular congestion diffuse interstitial opacity is similar to minimally progressive in the interval. No substantial pleural effusion. Rim calcified splenic lesion again noted. Telemetry leads overlie the chest. IMPRESSION: Similar to minimally progressive diffuse interstitial opacity suggesting edema. Electronically Signed   By: Camellia Candle M.D.   On: 11/10/2024 07:14     Echo preserved left ventricular function EF of 50 to 55%  TELEMETRY: Sinus tachycardia nonspecific ST-T wave changes:  ASSESSMENT AND PLAN:  Principal Problem:   Pneumonia Active Problems:   CHF (congestive heart failure) (HCC)   PNA (pneumonia)   NSTEMI (non-ST elevated myocardial infarction) (HCC) Acute respiratory failure  Plan Acute on chronic hypoxic respiratory failure with  desaturation now in ICU on BiPAP Multifocal pneumonia continue broad-spectrum antibiotic therapy continue intensive care critical care management Acute asthma COPD extubation continue inhalers steroids antibiotics respiratory support Acute on chronic HFpEF with elevated BNP continue IV Lasix  therapy for diuresis Elevated troponin probably from demand ischemia continue conservative therapy from a cardiac standpoint Chronic renal sufficiency appreciate follow-up renal function consider nephrology consult if worsens Elevated liver enzymes continue conservative therapy No invasive cardiac procedures recommended at this point   Cara JONETTA Lovelace, MD 11/11/2024 1:43 PM

## 2024-11-11 NOTE — Plan of Care (Signed)
  Problem: Clinical Measurements: Goal: Ability to maintain clinical measurements within normal limits will improve Outcome: Progressing Goal: Will remain free from infection Outcome: Progressing Goal: Diagnostic test results will improve Outcome: Progressing Goal: Respiratory complications will improve Outcome: Progressing   Problem: Elimination: Goal: Will not experience complications related to urinary retention Outcome: Progressing   Problem: Pain Managment: Goal: General experience of comfort will improve and/or be controlled Outcome: Progressing   Problem: Safety: Goal: Ability to remain free from injury will improve Outcome: Progressing   Problem: Skin Integrity: Goal: Risk for impaired skin integrity will decrease Outcome: Progressing

## 2024-11-11 NOTE — Evaluation (Signed)
 Physical Therapy Evaluation Patient Details Name: Helen Ramsey MRN: [id number] DOB: [birth year] Today's Date: 11/11/2024  History of Present Illness  Natia TARRA PENCE is a 89 y.o. female with medical history significant of chronic HFpEF, HTN, chronic hypoxic respiratory failure on 2 L continuously, CKD stage IIIb, RA on methotrexate , presented with worsening of cough shortness of breath and fever.  Clinical Impression  Pt was pleasant and willing to work with rehab.  She had some general fatigue/shortness of breath t/o the session but on 6L SpO2 remained in the mid 90s with transfers and a brief bout of ambulation (~35ft).  Pt showed good awareness with UE placement/use during transitions and though she had forward flexed posture and limited tolerance she did show good effort.  Pt will benefit from continued PT to address functional limitations.        If plan is discharge home, recommend the following: A little help with walking and/or transfers;A little help with bathing/dressing/bathroom;Assistance with cooking/housework;Assist for transportation   Can travel by private vehicle        Equipment Recommendations Rolling walker (2 wheels)  Recommendations for Other Services       Functional Status Assessment Patient has had a recent decline in their functional status and demonstrates the ability to make significant improvements in function in a reasonable and predictable amount of time.     Precautions / Restrictions Precautions Precautions: Fall Recall of Precautions/Restrictions: Intact Restrictions Weight Bearing Restrictions Per Provider Order: No      Mobility  Bed Mobility Overal bed mobility: Needs Assistance Bed Mobility: Supine to Sit     Supine to sit: Mod assist          Transfers Overall transfer level: Needs assistance Equipment used: Rolling walker (2 wheels) Transfers: Sit to/from Stand, Bed to chair/wheelchair/BSC Sit to Stand: Min assist   Step  pivot transfers: Contact guard assist       General transfer comment: pt slow but safe with the effort, SpO2 remains in the mid/high 90s on 6L    Ambulation/Gait Ambulation/Gait assistance: Contact guard assist Gait Distance (Feet): 7 Feet Assistive device: Rolling walker (2 wheels)         General Gait Details: After seated rest break pt willing to trial a small bout of ambulation - slow but again steady with no LOBs, clear reliance on the walker with forward lean but no overt safety issues.  Pt endorses fatigue with the effort but SpO2 continued to remain in the mid 90s on 6L.  Stairs            Wheelchair Mobility     Tilt Bed    Modified Rankin (Stroke Patients Only)       Balance Overall balance assessment: Needs assistance Sitting-balance support: No upper extremity supported, Feet supported Sitting balance-Leahy Scale: Fair     Standing balance support: Bilateral upper extremity supported Standing balance-Leahy Scale: Fair                               Pertinent Vitals/Pain Pain Assessment Pain Assessment: No/denies pain    Home Living Family/patient expects to be discharged to:: Skilled nursing facility                        Prior Function Prior Level of Function : Needs assist             Mobility Comments: RW household distances,  w/c out of room ADLs Comments: assist as needed     Extremity/Trunk Assessment   Upper Extremity Assessment Upper Extremity Assessment: Overall WFL for tasks assessed    Lower Extremity Assessment Lower Extremity Assessment: Overall WFL for tasks assessed       Communication   Communication Communication: Impaired Factors Affecting Communication: Hearing impaired    Cognition Arousal: Alert Behavior During Therapy: WFL for tasks assessed/performed                             Following commands: Impaired Following commands impaired: Follows multi-step commands  inconsistently     Cueing Cueing Techniques: Verbal cues     General Comments General comments (skin integrity, edema, etc.): SPO2 93% on 6L [place] with activity    Exercises     Assessment/Plan    PT Assessment Patient needs continued PT services  PT Problem List Decreased strength;Decreased range of motion;Decreased activity tolerance;Decreased balance;Decreased mobility;Decreased knowledge of use of DME;Decreased safety awareness;Cardiopulmonary status limiting activity       PT Treatment Interventions DME instruction;Gait training;Stair training;Functional mobility training;Therapeutic exercise;Therapeutic activities;Balance training;Neuromuscular re-education;Patient/family education    PT Goals (Current goals can be found in the Care Plan section)  Acute Rehab PT Goals Patient Stated Goal: get stronger and go home PT Goal Formulation: With patient Time For Goal Achievement: 11/24/24 Potential to Achieve Goals: Good    Frequency Min 2X/week     Co-evaluation PT/OT/SLP Co-Evaluation/Treatment: Yes Reason for Co-Treatment: To address functional/ADL transfers PT goals addressed during session: Mobility/safety with mobility OT goals addressed during session: ADL's and self-care       AM-PAC PT 6 Clicks Mobility  Outcome Measure Help needed turning from your back to your side while in a flat bed without using bedrails?: A Little Help needed moving from lying on your back to sitting on the side of a flat bed without using bedrails?: A Little Help needed moving to and from a bed to a chair (including a wheelchair)?: A Little Help needed standing up from a chair using your arms (e.g., wheelchair or bedside chair)?: A Little Help needed to walk in hospital room?: A Little Help needed climbing 3-5 steps with a railing? : A Lot 6 Click Score: 17    End of Session Equipment Utilized During Treatment: Gait belt;Oxygen  (6L) Activity Tolerance: Patient tolerated treatment  well;Patient limited by fatigue Patient left: in chair;with call bell/phone within reach Nurse Communication: Mobility status PT Visit Diagnosis: Muscle weakness (generalized) (M62.81);Difficulty in walking, not elsewhere classified (R26.2)    Time: 8394-8376 PT Time Calculation (min) (ACUTE ONLY): 18 min   Charges:   PT Evaluation $PT Eval Low Complexity: 1 Low   PT General Charges $$ ACUTE PT VISIT: 1 Visit         Carmin JONELLE Deed, DPT 11/11/2024, 4:52 PM

## 2024-11-11 NOTE — Care Management Important Message (Signed)
 Important Message  Patient Details  Name: Helen Ramsey MRN: [id number] Date of Birth: 1933-07-12   Important Message Given:  Yes - Medicare IM     Rojelio SHAUNNA Rattler 11/11/2024, 4:43 PM

## 2024-11-11 NOTE — Progress Notes (Signed)
 Pharmacy Antibiotic Note  Helen Ramsey is a 89 y.o. female admitted on 11/09/2024 with pneumonia.  Pharmacy has been consulted for vancomycin  dosing.  Plan:  Vancomycin  1.25 g IV q48h --Calculated AUC: 503, Cmin: 11.1 --Scr 1.02, IBW, Vd 0.72 --Daily Scr per protocol  Height: 4' 11 (149.9 cm) Weight: 67.4 kg (148 lb 9.4 oz) IBW/kg (Calculated) : 43.2  Temp (24hrs), Avg:98.2 F (36.8 C), Min:97.9 F (36.6 C), Max:98.5 F (36.9 C)  Recent Labs  Lab 11/09/24 0356 11/09/24 1020 11/10/24 0252 11/10/24 1033 11/10/24 1544 11/11/24 0428  WBC 13.7*  --  9.3  --   --  14.2*  CREATININE 1.10*  --  1.04*  --   --  1.02*  LATICACIDVEN 2.1* 2.5*  --  4.1* 2.3*  --     Estimated Creatinine Clearance: 31.2 mL/min (A) (by C-G formula based on SCr of 1.02 mg/dL (H)).    Allergies[1]  Antimicrobials this admission: Aztreonam  12/24 x 1 Azithromycin  12/24 >> Ceftriaxone  12/24 >> Vancomycin  12/24 x 1, 12/25 >>  Dose adjustments this admission: N/A  Microbiology results: 12/24 BCx: NGTD 12/25 Sputum: Pending  12/25 MRSA PCR: (+) 12/25 RVP: (+) Metapneumovirus  Thank you for involving pharmacy in this patient's care.   Helen Ramsey 11/11/2024 11:16 AM    [1]  Allergies Allergen Reactions   Amoxicillin    Clindamycin/Lincomycin    Doxycycline    Levaquin [Levofloxacin In D5w]    Mirabegron Nausea And Vomiting   Nsaids Other (See Comments)    GI upset   Oxycodone  Nausea Only    Other reaction(s): Hallucination   Phenergan [Promethazine Hcl]    Singulair [Montelukast]    Vicodin [Hydrocodone-Acetaminophen ]    Zocor [Simvastatin]    Zoloft  [Sertraline] Other (See Comments)   Ampicillin Rash   Bactrim [Sulfamethoxazole-Trimethoprim] Rash   Levofloxacin Other (See Comments)    Weakness   Penicillins Rash    Ampicillin, Clindamycin, Doxycycline Levaquin-severe headache, muscle and joint aches, weak   Tolmetin     Other reaction(s): Other (See Comments) GI  upset

## 2024-11-12 DIAGNOSIS — J189 Pneumonia, unspecified organism: Secondary | ICD-10-CM | POA: Diagnosis not present

## 2024-11-12 LAB — COMPREHENSIVE METABOLIC PANEL WITH GFR
ALT: 113 U/L — ABNORMAL HIGH (ref 0–44)
AST: 50 U/L — ABNORMAL HIGH (ref 15–41)
Albumin: 3.3 g/dL — ABNORMAL LOW (ref 3.5–5.0)
Alkaline Phosphatase: 82 U/L (ref 38–126)
Anion gap: 10 (ref 5–15)
BUN: 38 mg/dL — ABNORMAL HIGH (ref 8–23)
CO2: 28 mmol/L (ref 22–32)
Calcium: 9.1 mg/dL (ref 8.9–10.3)
Chloride: 103 mmol/L (ref 98–111)
Creatinine, Ser: 1.07 mg/dL — ABNORMAL HIGH (ref 0.44–1.00)
GFR, Estimated: 49 mL/min — ABNORMAL LOW
Glucose, Bld: 134 mg/dL — ABNORMAL HIGH (ref 70–99)
Potassium: 4 mmol/L (ref 3.5–5.1)
Sodium: 141 mmol/L (ref 135–145)
Total Bilirubin: 0.4 mg/dL (ref 0.0–1.2)
Total Protein: 6.2 g/dL — ABNORMAL LOW (ref 6.5–8.1)

## 2024-11-12 LAB — CBC
HCT: 34.1 % — ABNORMAL LOW (ref 36.0–46.0)
Hemoglobin: 11.1 g/dL — ABNORMAL LOW (ref 12.0–15.0)
MCH: 29.7 pg (ref 26.0–34.0)
MCHC: 32.6 g/dL (ref 30.0–36.0)
MCV: 91.2 fL (ref 80.0–100.0)
Platelets: 199 K/uL (ref 150–400)
RBC: 3.74 MIL/uL — ABNORMAL LOW (ref 3.87–5.11)
RDW: 15.1 % (ref 11.5–15.5)
WBC: 7.6 K/uL (ref 4.0–10.5)
nRBC: 0 % (ref 0.0–0.2)

## 2024-11-12 LAB — LEGIONELLA PNEUMOPHILA SEROGP 1 UR AG: L. pneumophila Serogp 1 Ur Ag: NEGATIVE

## 2024-11-12 LAB — MYCOPLASMA PNEUMONIAE ANTIBODY, IGM: Mycoplasma pneumo IgM: <770 U/mL (ref 0–769)

## 2024-11-12 LAB — HEPARIN LEVEL (UNFRACTIONATED): Heparin Unfractionated: 0.36 [IU]/mL (ref 0.30–0.70)

## 2024-11-12 MED ORDER — AMLODIPINE BESYLATE 5 MG PO TABS
2.5000 mg | ORAL_TABLET | Freq: Every day | ORAL | Status: DC
  Administered 2024-11-12 – 2024-11-13 (×2): 2.5 mg via ORAL
  Filled 2024-11-12 (×2): qty 1

## 2024-11-12 MED ORDER — PREDNISONE 20 MG PO TABS
40.0000 mg | ORAL_TABLET | Freq: Every day | ORAL | Status: DC
  Administered 2024-11-13 – 2024-11-14 (×2): 40 mg via ORAL
  Filled 2024-11-12: qty 4
  Filled 2024-11-12: qty 2

## 2024-11-12 MED ORDER — PANTOPRAZOLE SODIUM 40 MG PO TBEC
40.0000 mg | DELAYED_RELEASE_TABLET | Freq: Every day | ORAL | Status: DC
  Administered 2024-11-12 – 2024-11-14 (×3): 40 mg via ORAL
  Filled 2024-11-12 (×3): qty 1

## 2024-11-12 MED ORDER — SALINE SPRAY 0.65 % NA SOLN: 1.0000 | NASAL | Status: DC | PRN

## 2024-11-12 MED ORDER — TORSEMIDE 20 MG PO TABS
40.0000 mg | ORAL_TABLET | Freq: Two times a day (BID) | ORAL | Status: DC
  Administered 2024-11-12 – 2024-11-14 (×4): 40 mg via ORAL
  Filled 2024-11-12 (×4): qty 2

## 2024-11-12 NOTE — Progress Notes (Signed)
 [facility]    SUBJECTIVE: Resting comfortably in bed asleep not on BiPAP she was on nasal cannula appears to be in no respiratory distress   Vitals:   11/12/24 0737 11/12/24 0800 11/12/24 0821 11/12/24 0900  BP:  (!) 150/66  (!) 130/45  Pulse:  80 82 80  Resp:   16 (!) 22  Temp:  97.8 F (36.6 C)    TempSrc:  Oral    SpO2: 92% 94% 95% 96%  Weight:      Height:         Intake/Output Summary (Last 24 hours) at 11/12/2024 1049 Last data filed at 11/12/2024 9076 Gross per 24 hour  Intake 820.88 ml  Output 1200 ml  Net -379.12 ml      PHYSICAL EXAM  General: Well developed, well nourished, in no acute distress HEENT:  Normocephalic and atramatic Neck:  No JVD.  Lungs: Clear bilaterally to auscultation and percussion. Heart: HRRR . Normal S1 and S2 without gallops or murmurs.  Abdomen: Bowel sounds are positive, abdomen soft and non-tender  Msk:  Back normal, normal gait. Normal strength and tone for age. Extremities: No clubbing, cyanosis or edema.   Neuro: Alert and oriented X 3. Psych:  Good affect, responds appropriately   LABS: Basic Metabolic Panel: Recent Labs    11/11/24 0428 11/12/24 0359  NA 141 141  K 3.2* 4.0  CL 100 103  CO2 29 28  GLUCOSE 170* 134*  BUN 30* 38*  CREATININE 1.02* 1.07*  CALCIUM  9.0 9.1  MG 2.1  --    Liver Function Tests: Recent Labs    11/10/24 0252 11/12/24 0359  AST 113* 50*  ALT 148* 113*  ALKPHOS 99 82  BILITOT 0.4 0.4  PROT 7.0 6.2*  ALBUMIN 3.7 3.3*   No results for input(s): LIPASE, AMYLASE in the last 72 hours. CBC: Recent Labs    11/11/24 0428 11/12/24 0359  WBC 14.2* 7.6  HGB 12.9 11.1*  HCT 38.0 34.1*  MCV 88.6 91.2  PLT 262 199   Cardiac Enzymes: No results for input(s): CKTOTAL, CKMB, CKMBINDEX, TROPONINI in the last 72 hours. BNP: Invalid input(s): POCBNP D-Dimer: No results for input(s): DDIMER in the last 72 hours. Hemoglobin A1C: No results for input(s): HGBA1C in  the last 72 hours. Fasting Lipid Panel: No results for input(s): CHOL, HDL, LDLCALC, TRIG, CHOLHDL, LDLDIRECT in the last 72 hours. Thyroid  Function Tests: No results for input(s): TSH, T4TOTAL, T3FREE, THYROIDAB in the last 72 hours.  Invalid input(s): FREET3 Anemia Panel: No results for input(s): VITAMINB12, FOLATE, FERRITIN, TIBC, IRON, RETICCTPCT in the last 72 hours.  CT HEAD WO CONTRAST ( ) Result Date: 11/11/2024 EXAM: CT HEAD WITHOUT CONTRAST 11/11/2024 03:22:10 PM TECHNIQUE: CT of the head was performed without the administration of intravenous contrast. Automated exposure control, iterative reconstruction, and/or weight based adjustment of the mA/kV was utilized to reduce the radiation dose to as low as reasonably achievable. COMPARISON: 09/09/2024 CLINICAL HISTORY: lethargic, encephalopathy FINDINGS: BRAIN AND VENTRICLES: No acute hemorrhage. No evidence of acute infarct. No hydrocephalus. No extra-axial collection. No mass effect or midline shift. Stable background of moderate chronic small vessel disease. Mild generalized cerebral atrophy. ORBITS: No acute abnormality. SINUSES: Moderate pansinus disease with air fluid levels throughout. SOFT TISSUES AND SKULL: No acute soft tissue abnormality. No skull fracture. Unchanged chronic defect in the anterior nasal septum. Bilateral middle ear effusions. IMPRESSION: 1. No acute intracranial abnormality. 2. Moderate pansinusitis with air-fluid levels throughout and bilateral middle ear effusions.  Electronically signed by: [name] MD 11/11/2024 03:33 PM EST RP Workstation: [id number]   ECHOCARDIOGRAM COMPLETE Result Date: 11/11/2024    ECHOCARDIOGRAM REPORT   Patient Name:   Helen Ramsey Date of Exam: 11/11/2024 Medical Rec #:  [id number]        Height:       59.0 in Accession #:    [id number]       Weight:       148.6 lb Date of Birth:  [birth year]        BSA:          1.626 m Patient Age:    89 years          BP:           145/64 mmHg Patient Gender: F                HR:           83 bpm. Exam Location:  [facility] Procedure: 2D Echo, Cardiac Doppler, Color Doppler and Intracardiac            Opacification Agent (Both Spectral and Color Flow Doppler were            utilized during procedure). Indications:     Congestive Heart Failure I50.9  History:         Patient has prior history of Echocardiogram examinations, most                  recent 12/11/2022. CHF; Signs/Symptoms:Dyspnea.  Sonographer:     Ashley McNeely-Sloane Referring Phys:  [id number] [facility] PONNALA Diagnosing Phys: Redell Cave MD IMPRESSIONS  1. Left ventricular ejection fraction, by estimation, is 50 to 55%. The left ventricle has normal function. The left ventricle demonstrates regional wall motion abnormalities (see scoring diagram/findings for description). Left ventricular diastolic parameters are consistent with Grade I diastolic dysfunction (impaired relaxation). There is severe hypokinesis of the left ventricular, mid-apical anteroseptal wall and apical segment.  2. Right ventricular systolic function is normal. The right ventricular size is normal.  3. The mitral valve is degenerative. No evidence of mitral valve regurgitation.  4. The aortic valve is tricuspid. Aortic valve regurgitation is mild. Aortic valve sclerosis is present, with no evidence of aortic valve stenosis. FINDINGS  Left Ventricle: Left ventricular ejection fraction, by estimation, is 50 to 55%. The left ventricle has normal function. The left ventricle demonstrates regional wall motion abnormalities. Severe hypokinesis of the left ventricular, mid-apical anteroseptal wall and apical segment. Definity  contrast agent was given IV to delineate the left ventricular endocardial borders. The left ventricular internal cavity size was normal in size. There is no left ventricular hypertrophy. Left ventricular diastolic parameters are consistent with Grade I diastolic dysfunction  (impaired relaxation). Right Ventricle: The right ventricular size is normal. No increase in right ventricular wall thickness. Right ventricular systolic function is normal. Left Atrium: Left atrial size was normal in size. Right Atrium: Right atrial size was normal in size. Pericardium: There is no evidence of pericardial effusion. Mitral Valve: The mitral valve is degenerative in appearance. Mild to moderate mitral annular calcification. No evidence of mitral valve regurgitation. MV peak gradient, 10.5 mmHg. The mean mitral valve gradient is 4.0 mmHg. Tricuspid Valve: The tricuspid valve is normal in structure. Tricuspid valve regurgitation is not demonstrated. Aortic Valve: The aortic valve is tricuspid. Aortic valve regurgitation is mild. Aortic regurgitation PHT measures 387 msec. Aortic valve sclerosis is present, with no evidence of aortic valve stenosis. Aortic valve mean  gradient measures 3.0 mmHg. Aortic valve peak gradient measures 6.1 mmHg. Aortic valve area, by VTI measures 2.11 cm. Pulmonic Valve: The pulmonic valve was not well visualized. Pulmonic valve regurgitation is not visualized. Aorta: The aortic root and ascending aorta are structurally normal, with no evidence of dilitation. Venous: The inferior vena cava was not well visualized. IAS/Shunts: No atrial level shunt detected by color flow Doppler.  LEFT VENTRICLE PLAX 2D LVIDd:         4.20 cm     Diastology LVIDs:         2.30 cm     LV e' medial:    3.29 cm/s LV PW:         0.90 cm     LV E/e' medial:  21.5 LV IVS:        1.40 cm     LV e' lateral:   5.92 cm/s LVOT diam:     1.70 cm     LV E/e' lateral: 11.9 LV SV:         49 LV SV Index:   30 LVOT Area:     2.27 cm  LV Volumes (MOD) LV vol d, MOD A2C: 54.6 ml LV vol d, MOD A4C: 76.1 ml LV vol s, MOD A2C: 38.1 ml LV vol s, MOD A4C: 38.4 ml LV SV MOD A2C:     16.5 ml LV SV MOD A4C:     76.1 ml LV SV MOD BP:      28.5 ml RIGHT VENTRICLE RV Basal diam:  3.90 cm RV Mid diam:    3.90 cm RV S  prime:     11.20 cm/s TAPSE (M-mode): 1.6 cm LEFT ATRIUM             Index        RIGHT ATRIUM           Index LA diam:        4.00 cm 2.46 cm/m   RA Area:     14.40 cm LA Vol (A2C):   63.8 ml 39.25 ml/m  RA Volume:   32.00 ml  19.69 ml/m LA Vol (A4C):   44.9 ml 27.62 ml/m LA Biplane Vol: 53.9 ml 33.16 ml/m  AORTIC VALVE                    PULMONIC VALVE AV Area (Vmax):    2.09 cm     PV Vmax:        1.09 m/s AV Area (Vmean):   2.03 cm     PV Vmean:       75.700 cm/s AV Area (VTI):     2.11 cm     PV VTI:         0.191 m AV Vmax:           123.00 cm/s  PV Peak grad:   4.8 mmHg AV Vmean:          85.600 cm/s  PV Mean grad:   3.0 mmHg AV VTI:            0.231 m      RVOT Peak grad: 3 mmHg AV Peak Grad:      6.1 mmHg AV Mean Grad:      3.0 mmHg LVOT Vmax:         113.00 cm/s LVOT Vmean:        76.600 cm/s LVOT VTI:          0.215 m LVOT/AV  VTI ratio: 0.93 AI PHT:            387 msec  AORTA Ao Root diam: 2.90 cm Ao Asc diam:  3.40 cm MITRAL VALVE MV Area (PHT): 3.72 cm     SHUNTS MV Area VTI:   1.50 cm     Systemic VTI:  0.22 m MV Peak grad:  10.5 mmHg    Systemic Diam: 1.70 cm MV Mean grad:  4.0 mmHg     Pulmonic VTI:  0.127 m MV Vmax:       1.62 m/s MV Vmean:      93.6 cm/s MV Decel Time: 204 msec MV E velocity: 70.60 cm/s MV A velocity: 143.00 cm/s MV E/A ratio:  0.49 Redell Cave MD Electronically signed by Redell Cave MD Signature Date/Time: 11/11/2024/1:16:57 PM    Final    DG Chest 1 View Result Date: 11/10/2024 CLINICAL DATA:  Shortness of breath EXAM: CHEST  1 VIEW COMPARISON:  11/10/2024, 11/09/2024, 01/28/2023 FINDINGS: Stable cardiomediastinal silhouette with aortic atherosclerosis. New heterogeneous airspace disease within the right thorax compared to prior. Patchy airspace disease left lung base. Stable/chronic rim calcification in the left upper quadrant/spleen. IMPRESSION: New heterogeneous airspace disease within the right thorax and left lung base, suspicious for multifocal  pneumonia. Underlying cardiomegaly and diffuse interstitial opacities suggestive of edema. Possible small right-sided pleural effusion Electronically Signed   By: Luke Bun M.D.   On: 11/10/2024 16:58     Echo 50 to 55%  TELEMETRY: Normal sinus rhythm rate of 80 nonspecific ST-T wave changes:  ASSESSMENT AND PLAN:  Principal Problem:   Pneumonia Active Problems:   CHF (congestive heart failure) (HCC)   PNA (pneumonia)   NSTEMI (non-ST elevated myocardial infarction) (HCC)    Plan Acute on chronic hypoxic respiratory failure continue respiratory support supplemental oxygen  inhalers broad-spectrum antibiotic therapy BiPAP as necessary Acute asthma COPD extubation continue inhalers Solu-Medrol  antibiotics Mucinex  incentive spirometry Obesity recommend modest weight loss exercise portion control Chronic renal sufficiency stage III maintain adequate renal perfusion consider nephrology input Elevated troponin consistent with demand ischemia myocardial injury without MI continue conservative therapy Rheumatoid arthritis continue methotrexate  therapy History of DVT currently maintained on heparin  will consider transitioning back to Eliquis  Recommend conservative cardiac input at this stage   Helen JONETTA Lovelace, MD 11/12/2024 10:49 AM

## 2024-11-12 NOTE — Evaluation (Addendum)
 Clinical/Bedside Swallow Evaluation Patient Details  Name: Helen Ramsey Date of Birth: Aug 28, 1933  Today's Date: 11/12/2024 Time: SLP Start Time (ACUTE ONLY): 1015 SLP Stop Time (ACUTE ONLY): 1045 SLP Time Calculation (min) (ACUTE ONLY): 30 min  Past Medical History:  Past Medical History:  Diagnosis Date   Abnormal SPEP    Allergic rhinitis, cause unspecified    Allergic rhinitis, cause unspecified    Arthritis    Atherosclerosis of aorta    Back pain    lumbar   Degeneration of lumbar or lumbosacral intervertebral disc    Diverticulitis of colon (without mention of hemorrhage)(562.11)    Dizziness and giddiness    Dysthymic disorder    Headache(784.0)    Myalgia and myositis, unspecified    Osteoporosis, unspecified    Other and unspecified hyperlipidemia    Other diseases of nasal cavity and sinuses(478.19)    Other specified disorders of bladder    Syncope    Trigger finger (acquired)    Unspecified essential hypertension    Unspecified sleep apnea    Urinary frequency    Past Surgical History:  Past Surgical History:  Procedure Laterality Date   ABDOMINAL HYSTERECTOMY     ADENOIDECTOMY     APPENDECTOMY     ARTHROSCOPIC KNEE SURGERY     BREAST BIOPSY Right    lymph node surgery, benign    BREAST EXCISIONAL BIOPSY     CARDIAC CATHETERIZATION     [facility]   CARPAL TUNNEL RELEASE     CATARACT EXTRACTION W/ INTRAOCULAR LENS IMPLANT & ANTERIOR VITRECTOMY, BILATERAL     CHOLECYSTECTOMY     KNEE ARTHROPLASTY Right 01/30/2016   Procedure: COMPUTER ASSISTED TOTAL KNEE ARTHROPLASTY;  Surgeon: Lynwood SHAUNNA Hue, MD;  Location: [facility] ORS;  Service: Orthopedics;  Laterality: Right;   R/O Lymph Nodes; Right Axilla     ROTATOR CUFF REPAIR Right    SPG block  03/20/2016   sphenopalatine ganglion and maxillary division of trigeminal nerve block block Dr. Maree   TEMPORAL ARTERY BIOPSY / LIGATION     TONSILLECTOMY     TUBAL LIGATION     HPI:  Helen Ramsey  is a 89 y.o. female with medical history significant of Helen Ramsey, Helen Ramsey, Helen Ramsey, Helen Ramsey, Helen Ramsey , Helen with worsening of cough shortness of breath and fever.  Patient has been having productive cough for about 1 week, started to have yellow sputum production and dyspnea on exertion.   Patient was admitted due to acute on Helen hypoxic respiratory failure, multifocal pneumonia. Head CT:  No acute intracranial abnormality.  2. Moderate pansinusitis with air-fluid levels throughout and bilateral middle  ear effusions. New heterogeneous airspace disease within the right thorax and left  lung base, suspicious for multifocal pneumonia. Underlying  cardiomegaly and diffuse interstitial opacities suggestive of edema.  Possible small right-sided pleural effusion    Assessment / Plan / Recommendation  Clinical Impression  Pt seen for bedside swallow assessment in the setting of concern for aspiration in the setting of multifocal PNA and acute on Helen hypoxic respiratory failure. Pt's son and daughter in law present for session, reporting occasional coughing with PO intake, with increase in cough overall since upper respiratory infection a few weeks ago. Per report, pt often speaks while eating, chews/swallows quickly, and falls asleep after eating. No history of dysphagia, GERD, or PNA noted in chart/reported by family.   Today, pt resting on 4L nasal  canula, with O2 saturations maintained at greater than 95 for duration of session. Noted lethargy, with moderate tactile and verbal cues provided for increased alertness. Pt seen with trials of thin liquids (via straw) and regular solids. Pt self feeding t/o trials. No overt or subtle s/sx pharyngeal dysphagia noted. No change to vocal quality across trials. Vitals stable for duration of trials. Oral phase grossly intact- with complete manipulation and clearance of regular solid from oral  cavity.   Based on age, acute on Helen respirator function, and current debility, pt is at increased risk of aspiration. Therefore, recommend aspiration precautions (slow rate, small bites, elevated HOB, and alert for PO intake). Medications whole vs cut with thin liquids. Given noted lethargy- supervision recommended to ensure sufficient alertness/endurance for intake. Continue with current unrestricted diet. Education shared with family regarding risk for aspiration and need for precautions. No further SLP services indicated at this time. MD and RN aware of recommendations.   SLP Visit Diagnosis: Dysphagia, unspecified (R13.10) (suspect related to acute deconditioning and pulmonary status)    Aspiration Risk  Mild aspiration risk    Diet Recommendation   Thin;Age appropriate regular  Medication Administration: Whole meds with liquid (cut large pills as needed)    Other Recommendations Oral Care Recommendations: Oral care BID;Staff/trained caregiver to provide oral care      Assistance Recommended at Discharge  Supervision for PO intake   Functional Status Assessment Patient has not had a recent decline in their functional status    Swallow Study   General Date of Onset: 11/12/24 HPI: Helen Ramsey is a 89 y.o. female with medical history significant of Helen Ramsey, Helen Ramsey, Helen Ramsey, Helen Ramsey, Helen Ramsey , Helen with worsening of cough shortness of breath and fever.  Patient has been having productive cough for about 1 week, started to have yellow sputum production and dyspnea on exertion.   Patient was admitted due to acute on Helen hypoxic respiratory failure, multifocal pneumonia. Head CT:  No acute intracranial abnormality.  2. Moderate pansinusitis with air-fluid levels throughout and bilateral middle  ear effusions. New heterogeneous airspace disease within the right thorax and left  lung base, suspicious for  multifocal pneumonia. Underlying  cardiomegaly and diffuse interstitial opacities suggestive of edema.  Possible small right-sided pleural effusion Type of Study: Bedside Swallow Evaluation Previous Swallow Assessment: none in chart Diet Prior to this Study: Regular;Thin liquids (Level 0) Temperature Spikes Noted: No Respiratory Status: Nasal cannula (4L) History of Recent Intubation: No Behavior/Cognition: Lethargic/Drowsy;Requires cueing Oral Cavity Assessment: Within Functional Limits Oral Care Completed by SLP: Yes Oral Cavity - Dentition: Adequate natural dentition Vision: Functional for self-feeding Self-Feeding Abilities: Able to feed self Patient Positioning: Upright in bed Baseline Vocal Quality: Low vocal intensity Volitional Cough: Cognitively unable to elicit Volitional Swallow: Able to elicit    Oral/Motor/Sensory Function Overall Oral Motor/Sensory Function: Within functional limits   Ice Chips Ice chips: Not tested   Thin Liquid Thin Liquid: Within functional limits Presentation: Straw;Self Fed    Nectar Thick Nectar Thick Liquid: Not tested   Honey Thick Honey Thick Liquid: Not tested   Puree Puree: Not tested   Solid     Solid: Within functional limits Presentation: Self Fed     Camille Thau Clapp, MS, CCC-SLP Speech Language Pathologist Rehab Services; [facility] [phone] (ascom)   Octavion Mollenkopf J Clapp 11/12/2024,11:40 AM

## 2024-11-12 NOTE — Progress Notes (Signed)
 " PROGRESS NOTE    Helen Ramsey  FMW:[id number] DOB: June 10, 1933 DOA: 11/09/2024 PCP: Helen Jon CHRISTELLA, MD  Chief Complaint  Patient presents with   Respiratory Distress    Hospital Course:  Helen Ramsey is a 89 y.o. female with medical history significant of chronic HFpEF, HTN, chronic hypoxic respiratory failure on 2 L continuously, CKD stage IIIb, RA on methotrexate , presented with worsening of cough shortness of breath and fever.  Patient has been having productive cough for about 1 week, started to have yellow sputum production and dyspnea on exertion.  Patient was admitted due to acute on chronic hypoxic respiratory failure, multifocal pneumonia,  Hospital course as below  Subjective: Patient was examined at the bedside, reports feeling significantly better today Continues to have wheezing, will continue IV Solu-Medrol  and likely transition to p.o. prednisone  tomorrow Denies any complaints today RN reports concerns of urinary retention, but patient was able to void -continue to monitor Transfer to progressive care unit  Objective: Vitals:   11/12/24 1500 11/12/24 1515 11/12/24 1600 11/12/24 1700  BP: (!) 153/54  (!) 152/51 (!) 155/55  Pulse: 77 76 75 77  Resp: 18 (!) 21 20 (!) 24  Temp:   97.9 F (36.6 C)   TempSrc:   Axillary   SpO2: 95% 98% 99% 99%  Weight:      Height:        Intake/Output Summary (Last 24 hours) at 11/12/2024 1815 Last data filed at 11/12/2024 1541 Gross per 24 hour  Intake 1368.44 ml  Output 1268 ml  Net 100.44 ml   Filed Weights   11/10/24 1712 11/11/24 0500 11/12/24 0500  Weight: 66.2 kg 67.4 kg 67.2 kg    Examination: General exam: Calm, comfortable, no acute distress Neck: normal, supple, no masses, no thyromegaly Respiratory: Diffuse bilateral wheezing, bibasilar crackles present Cardiovascular: Regular rate and rhythm, no murmurs / rubs / gallops.  1+ extremity edema predominantly on the left ankle. 2+ pedal pulses. No  carotid bruits.  Abdomen: no tenderness, no masses palpated. No hepatosplenomegaly. Bowel sounds positive.  Musculoskeletal: no clubbing / cyanosis. No joint deformity upper and lower extremities. Good ROM, no contractures. Normal muscle tone.  Skin: no rashes, lesions, ulcers. No induration Neurologic: No gross focal deficits  Assessment & Plan:  Acute on chronic hypoxic respiratory failure Suspect multifactorial due to multifocal pneumonia, metapneumovirus infection, asthma/COPD exacerbation, acute on chronic HFpEF Was on BiPAP on presentation, was able to wean to 4L [place] Baseline on 2 L [place] supplemental oxygen   Severe Sepsis due to Multifocal pneumonia Metapneumovirus infection On presentation HR > 90, leukocytosis, lactic acidosis, meets SIRS criteria with source of infection - pneumonia  Lactic acidosis improving, Leukocytosis resolved MRSA nares positive continue vancomycin , ceftriaxone , azithromycin   Legionella, mycoplasma pending Follow respiratory culture, blood culture Seen by SLP  Acute asthma/COPD exacerbation Change IV Solumedrol to po prednisone , azithromycin  Home inhalers, DuoNebs as needed Mucinex  Incentive spirometry, flutter valve, hypertonic saline nebs  Acute on chronic HFpEF BNP elevated Change IV Lasix  to p.o. torsemide  Monitor daily weight, intake/output  Lethargic, suspect due to infectious etiology - resolved ABG with no CO2 narcosis CT head with no acute abnormality.  Has pansinusitis -on broad-spectrum antibiotic Supportive care and monitor  Elevated troponin Troponin uptrending, denies chest pain Suspect demand ischemia due to pneumonia, CHF, asthma/COPD exacerbation Echo shows EF 50 to 55%, severe hypokinesis of LV, mid apical anterior septal wall, grade 1 DD, mild AR Continue IV heparin , Trend troponin Cardiology following, appreciate recs  Hypokalemia Monitor and replete as needed  CKD stage 3a/b Monitor creatinine, stable  Acute  transaminitis - improving Suspect atypical pneumonia US  abd status postcholecystectomy, probable hepatic steatosis Hold off statin Trend LFT's  HLD LDL 123, not compliant with statins  HTN resume amlodipine  2.5mg   History of DVT On heparin  drip, hold off Eliquis    Anxiety/depression Continue SSRI, Buspar   GERD Pepcid , PPI  Rheumatoid arthritis Hold Methotrexate   Obesity class I Body mass index is 32.24 kg/m. - Outpatient follow up for lifestyle modification and risk factor management  PT/OT eval   DVT prophylaxis: IV Heparin    Code Status: Limited: Do not attempt resuscitation (DNR) -DNR-LIMITED -Do Not Intubate/DNI  Disposition:  TBD  Consultants:  Cardiology  Procedures:  None  Antimicrobials:  Anti-infectives (From admission, onward)    Start     Dose/Rate Route Frequency Ordered Stop   11/12/24 2000  vancomycin  (VANCOREADY) IVPB 1250 mg/250 mL        1,250 mg 166.7 mL/hr over 90 Minutes Intravenous Every 48 hours 11/11/24 0846     11/11/24 1900  vancomycin  (VANCOREADY) IVPB 750 mg/150 mL  Status:  Discontinued        750 mg 150 mL/hr over 60 Minutes Intravenous Every 24 hours 11/10/24 1750 11/11/24 0846   11/10/24 1900  vancomycin  (VANCOREADY) IVPB 1500 mg/300 mL        1,500 mg 150 mL/hr over 120 Minutes Intravenous  Once 11/10/24 1750 11/10/24 2219   11/10/24 1000  cefTRIAXone  (ROCEPHIN ) 2 g in sodium chloride  0.9 % 100 mL IVPB        2 g 200 mL/hr over 30 Minutes Intravenous Every 24 hours 11/09/24 1125     11/09/24 1045  cefTRIAXone  (ROCEPHIN ) 1 g in sodium chloride  0.9 % 100 mL IVPB  Status:  Discontinued        1 g 200 mL/hr over 30 Minutes Intravenous Every 24 hours 11/09/24 1037 11/09/24 1125   11/09/24 1045  azithromycin  (ZITHROMAX ) 500 mg in sodium chloride  0.9 % 250 mL IVPB        500 mg 250 mL/hr over 60 Minutes Intravenous Every 24 hours 11/09/24 1037     11/09/24 0400  aztreonam  (AZACTAM ) 2 g in sodium chloride  0.9 % 100 mL IVPB         2 g 200 mL/hr over 30 Minutes Intravenous  Once 11/09/24 0353 11/09/24 0442   11/09/24 0400  vancomycin  (VANCOCIN ) IVPB 1000 mg/200 mL premix        1,000 mg 200 mL/hr over 60 Minutes Intravenous  Once 11/09/24 0353 11/09/24 0606       Data Reviewed: I have personally reviewed following labs and imaging studies CBC: Recent Labs  Lab 11/09/24 0356 11/10/24 0252 11/11/24 0428 11/12/24 0359  WBC 13.7* 9.3 14.2* 7.6  NEUTROABS 12.2*  --   --   --   HGB 12.7 11.9* 12.9 11.1*  HCT 38.5 35.5* 38.0 34.1*  MCV 88.7 89.4 88.6 91.2  PLT 232 236 262 199   Basic Metabolic Panel: Recent Labs  Lab 11/09/24 0356 11/10/24 0252 11/11/24 0428 11/12/24 0359  NA 138 144 141 141  K 3.9 3.6 3.2* 4.0  CL 98 103 100 103  CO2 26 28 29 28   GLUCOSE 117* 111* 170* 134*  BUN 36* 38* 30* 38*  CREATININE 1.10* 1.04* 1.02* 1.07*  CALCIUM  9.6 9.3 9.0 9.1  MG  --   --  2.1  --    GFR: Estimated Creatinine Clearance: 29.7 mL/min (  A) (by C-G formula based on SCr of 1.07 mg/dL (H)). Liver Function Tests: Recent Labs  Lab 11/09/24 0356 11/10/24 0252 11/12/24 0359  AST 103* 113* 50*  ALT 116* 148* 113*  ALKPHOS 132* 99 82  BILITOT 0.4 0.4 0.4  PROT 7.9 7.0 6.2*  ALBUMIN 4.1 3.7 3.3*   CBG: Recent Labs  Lab 11/10/24 1716  GLUCAP 211*    Recent Results (from the past 240 hours)  Resp panel by RT-PCR (RSV, Flu A&B, Covid) Anterior Nasal Swab     Status: None   Collection Time: 11/09/24  3:56 AM   Specimen: Anterior Nasal Swab  Result Value Ref Range Status   SARS Coronavirus 2 by RT PCR NEGATIVE NEGATIVE Final    Comment: (NOTE) SARS-CoV-2 target nucleic acids are NOT DETECTED.  The SARS-CoV-2 RNA is generally detectable in upper respiratory specimens during the acute phase of infection. The lowest concentration of SARS-CoV-2 viral copies this assay can detect is 138 copies/mL. A negative result does not preclude SARS-Cov-2 infection and should not be used as the sole basis for  treatment or other patient management decisions. A negative result may occur with  improper specimen collection/handling, submission of specimen other than nasopharyngeal swab, presence of viral mutation(s) within the areas targeted by this assay, and inadequate number of viral copies(<138 copies/mL). A negative result must be combined with clinical observations, patient history, and epidemiological information. The expected result is Negative.  Fact Sheet for Patients:  bloggercourse.com  Fact Sheet for Healthcare Providers:  seriousbroker.it  This test is no t yet approved or cleared by the [place]  FDA and  has been authorized for detection and/or diagnosis of SARS-CoV-2 by FDA under an Emergency Use Authorization (EUA). This EUA will remain  in effect (meaning this test can be used) for the duration of the COVID-19 declaration under Section 564(b)(1) of the Act, 21 U.S.C.section 360bbb-3(b)(1), unless the authorization is terminated  or revoked sooner.       Influenza A by PCR NEGATIVE NEGATIVE Final   Influenza B by PCR NEGATIVE NEGATIVE Final    Comment: (NOTE) The Xpert Xpress SARS-CoV-2/FLU/RSV plus assay is intended as an aid in the diagnosis of influenza from Nasopharyngeal swab specimens and should not be used as a sole basis for treatment. Nasal washings and aspirates are unacceptable for Xpert Xpress SARS-CoV-2/FLU/RSV testing.  Fact Sheet for Patients: bloggercourse.com  Fact Sheet for Healthcare Providers: seriousbroker.it  This test is not yet approved or cleared by the [place]  FDA and has been authorized for detection and/or diagnosis of SARS-CoV-2 by FDA under an Emergency Use Authorization (EUA). This EUA will remain in effect (meaning this test can be used) for the duration of the COVID-19 declaration under Section 564(b)(1) of the Act, 21  U.S.C. section 360bbb-3(b)(1), unless the authorization is terminated or revoked.     Resp Syncytial Virus by PCR NEGATIVE NEGATIVE Final    Comment: (NOTE) Fact Sheet for Patients: bloggercourse.com  Fact Sheet for Healthcare Providers: seriousbroker.it  This test is not yet approved or cleared by the [place]  FDA and has been authorized for detection and/or diagnosis of SARS-CoV-2 by FDA under an Emergency Use Authorization (EUA). This EUA will remain in effect (meaning this test can be used) for the duration of the COVID-19 declaration under Section 564(b)(1) of the Act, 21 U.S.C. section 360bbb-3(b)(1), unless the authorization is terminated or revoked.  Performed at [facility], [address] [place], [place] [address]   Blood Culture (routine  x 2)     Status: None (Preliminary result)   Collection Time: 11/09/24  3:56 AM   Specimen: BLOOD  Result Value Ref Range Status   Specimen Description BLOOD LEFT ANTECUBITAL  Final   Special Requests   Final    BOTTLES DRAWN AEROBIC AND ANAEROBIC Blood Culture adequate volume   Culture   Final    NO GROWTH 3 DAYS Performed at [facility], [address] [place], [place] [address]    Report Status PENDING  Incomplete  Blood Culture (routine x 2)     Status: None (Preliminary result)   Collection Time: 11/09/24  3:57 AM   Specimen: BLOOD  Result Value Ref Range Status   Specimen Description BLOOD BLOOD LEFT ARM  Final   Special Requests   Final    BOTTLES DRAWN AEROBIC AND ANAEROBIC Blood Culture adequate volume   Culture   Final    NO GROWTH 3 DAYS Performed at [name], [address], [place], [place] [address]    Report Status PENDING  Incomplete  Respiratory (~20 pathogens) panel by PCR     Status: Abnormal   Collection Time: 11/10/24 12:40 PM   Specimen: Nasopharyngeal Swab; Respiratory  Result Value Ref Range Status    Adenovirus NOT DETECTED NOT DETECTED Final   Coronavirus 229E NOT DETECTED NOT DETECTED Final    Comment: (NOTE) The Coronavirus on the Respiratory Panel, DOES NOT test for the novel  Coronavirus (2019 nCoV)    Coronavirus HKU1 NOT DETECTED NOT DETECTED Final   Coronavirus NL63 NOT DETECTED NOT DETECTED Final   Coronavirus OC43 NOT DETECTED NOT DETECTED Final   Metapneumovirus DETECTED (A) NOT DETECTED Final   Rhinovirus / Enterovirus NOT DETECTED NOT DETECTED Final   Influenza A NOT DETECTED NOT DETECTED Final   Influenza B NOT DETECTED NOT DETECTED Final   Parainfluenza Virus 1 NOT DETECTED NOT DETECTED Final   Parainfluenza Virus 2 NOT DETECTED NOT DETECTED Final   Parainfluenza Virus 3 NOT DETECTED NOT DETECTED Final   Parainfluenza Virus 4 NOT DETECTED NOT DETECTED Final   Respiratory Syncytial Virus NOT DETECTED NOT DETECTED Final   Bordetella pertussis NOT DETECTED NOT DETECTED Final   Bordetella Parapertussis NOT DETECTED NOT DETECTED Final   Chlamydophila pneumoniae NOT DETECTED NOT DETECTED Final   Mycoplasma pneumoniae NOT DETECTED NOT DETECTED Final    Comment: Performed at [facility], [address] [place], [place] [address]  MRSA Next Gen by PCR, Nasal     Status: Abnormal   Collection Time: 11/10/24 12:40 PM   Specimen: Nasal Mucosa; Nasal Swab  Result Value Ref Range Status   MRSA by PCR Next Gen DETECTED (A) NOT DETECTED Final    Comment: RESULT CALLED TO, READ BACK BY AND VERIFIED WITH: PRAWEENA BHUSAL RN AT 1532 ON 11/10/2024 BY KC (NOTE) The GeneXpert MRSA Assay (FDA approved for NASAL specimens only), is one component of a comprehensive MRSA colonization surveillance program. It is not intended to diagnose MRSA infection nor to guide or monitor treatment for MRSA infections. Test performance is not FDA approved in patients less than 39 years old. Performed at [facility], [address] [place], [place] [address]   Expectorated Sputum  Assessment w Gram Stain, Rflx to Resp Cult     Status: None   Collection Time: 11/10/24  2:30 PM   Specimen: Sputum  Result Value Ref Range Status   Specimen Description SPUTUM  Final   Special Requests NONE  Final   Sputum evaluation   Final    THIS SPECIMEN IS ACCEPTABLE FOR SPUTUM CULTURE Performed at [facility], [address] [place], [place] [address]    Report Status 11/10/2024 FINAL  Final  Culture, Respiratory w Gram Stain     Status: None (Preliminary result)   Collection Time: 11/10/24  2:30 PM   Specimen: SPU  Result Value Ref Range Status   Specimen Description   Final    SPUTUM Performed at [facility], [address], [place], [place] [address]    Special Requests   Final    NONE Reflexed from [id number] Performed at [facility], [address] [place], [place] [address]    Gram Stain   Final    FEW WBC PRESENT, PREDOMINANTLY PMN RARE GRAM POSITIVE COCCI RARE GRAM NEGATIVE RODS    Culture   Final    RARE STAPHYLOCOCCUS AUREUS SUSCEPTIBILITIES TO FOLLOW Performed at [facility], [address] [place], [place] [address]    Report Status PENDING  Incomplete     Radiology Studies: CT HEAD WO CONTRAST ( ) Result Date: 11/11/2024 EXAM: CT HEAD WITHOUT CONTRAST 11/11/2024 03:22:10 PM TECHNIQUE: CT of the head was performed without the administration of intravenous contrast. Automated exposure control, iterative reconstruction, and/or weight based adjustment of the mA/kV was utilized to reduce the radiation dose to as low as reasonably achievable. COMPARISON: 09/09/2024 CLINICAL HISTORY: lethargic, encephalopathy FINDINGS: BRAIN AND VENTRICLES: No acute hemorrhage. No evidence of acute infarct. No hydrocephalus. No extra-axial collection. No mass effect or midline shift. Stable background of moderate chronic small vessel disease. Mild generalized cerebral atrophy. ORBITS: No acute abnormality. SINUSES: Moderate pansinus disease  with air fluid levels throughout. SOFT TISSUES AND SKULL: No acute soft tissue abnormality. No skull fracture. Unchanged chronic defect in the anterior nasal septum. Bilateral middle ear effusions. IMPRESSION: 1. No acute intracranial abnormality. 2. Moderate pansinusitis with air-fluid levels throughout and bilateral middle ear effusions. Electronically signed by: [name] MD 11/11/2024 03:33 PM EST RP Workstation: [id number]   ECHOCARDIOGRAM COMPLETE Result Date: 11/11/2024    ECHOCARDIOGRAM REPORT   Patient Name:   Helen Ramsey Date of Exam: 11/11/2024 Medical Rec #:  [id number]        Height:       59.0 in Accession #:    [id number]       Weight:       148.6 lb Date of Birth:  05/31/1933        BSA:          1.626 m Patient Age:    89 years         BP:           145/64 mmHg Patient Gender: F                HR:           83 bpm. Exam Location:  [facility] Procedure: 2D Echo, Cardiac Doppler, Color Doppler and Intracardiac            Opacification Agent (Both Spectral and Color Flow Doppler were            utilized during procedure). Indications:     Congestive Heart Failure I50.9  History:         Patient has prior history of Echocardiogram examinations, most                  recent 12/11/2022. CHF; Signs/Symptoms:Dyspnea.  Sonographer:  Ashley McNeely-Sloane Referring Phys:  [id number] [facility] Webster Patrone Diagnosing Phys: Redell Cave MD IMPRESSIONS  1. Left ventricular ejection fraction, by estimation, is 50 to 55%. The left ventricle has normal function. The left ventricle demonstrates regional wall motion abnormalities (see scoring diagram/findings for description). Left ventricular diastolic parameters are consistent with Grade I diastolic dysfunction (impaired relaxation). There is severe hypokinesis of the left ventricular, mid-apical anteroseptal wall and apical segment.  2. Right ventricular systolic function is normal. The right ventricular size is normal.  3. The mitral valve is degenerative. No  evidence of mitral valve regurgitation.  4. The aortic valve is tricuspid. Aortic valve regurgitation is mild. Aortic valve sclerosis is present, with no evidence of aortic valve stenosis. FINDINGS  Left Ventricle: Left ventricular ejection fraction, by estimation, is 50 to 55%. The left ventricle has normal function. The left ventricle demonstrates regional wall motion abnormalities. Severe hypokinesis of the left ventricular, mid-apical anteroseptal wall and apical segment. Definity  contrast agent was given IV to delineate the left ventricular endocardial borders. The left ventricular internal cavity size was normal in size. There is no left ventricular hypertrophy. Left ventricular diastolic parameters are consistent with Grade I diastolic dysfunction (impaired relaxation). Right Ventricle: The right ventricular size is normal. No increase in right ventricular wall thickness. Right ventricular systolic function is normal. Left Atrium: Left atrial size was normal in size. Right Atrium: Right atrial size was normal in size. Pericardium: There is no evidence of pericardial effusion. Mitral Valve: The mitral valve is degenerative in appearance. Mild to moderate mitral annular calcification. No evidence of mitral valve regurgitation. MV peak gradient, 10.5 mmHg. The mean mitral valve gradient is 4.0 mmHg. Tricuspid Valve: The tricuspid valve is normal in structure. Tricuspid valve regurgitation is not demonstrated. Aortic Valve: The aortic valve is tricuspid. Aortic valve regurgitation is mild. Aortic regurgitation PHT measures 387 msec. Aortic valve sclerosis is present, with no evidence of aortic valve stenosis. Aortic valve mean gradient measures 3.0 mmHg. Aortic valve peak gradient measures 6.1 mmHg. Aortic valve area, by VTI measures 2.11 cm. Pulmonic Valve: The pulmonic valve was not well visualized. Pulmonic valve regurgitation is not visualized. Aorta: The aortic root and ascending aorta are structurally  normal, with no evidence of dilitation. Venous: The inferior vena cava was not well visualized. IAS/Shunts: No atrial level shunt detected by color flow Doppler.  LEFT VENTRICLE PLAX 2D LVIDd:         4.20 cm     Diastology LVIDs:         2.30 cm     LV e' medial:    3.29 cm/s LV PW:         0.90 cm     LV E/e' medial:  21.5 LV IVS:        1.40 cm     LV e' lateral:   5.92 cm/s LVOT diam:     1.70 cm     LV E/e' lateral: 11.9 LV SV:         49 LV SV Index:   30 LVOT Area:     2.27 cm  LV Volumes (MOD) LV vol d, MOD A2C: 54.6 ml LV vol d, MOD A4C: 76.1 ml LV vol s, MOD A2C: 38.1 ml LV vol s, MOD A4C: 38.4 ml LV SV MOD A2C:     16.5 ml LV SV MOD A4C:     76.1 ml LV SV MOD BP:      28.5 ml RIGHT VENTRICLE RV Basal diam:  3.90 cm RV Mid diam:    3.90 cm RV S prime:     11.20 cm/s TAPSE (M-mode): 1.6 cm LEFT ATRIUM             Index        RIGHT ATRIUM           Index LA diam:        4.00 cm 2.46 cm/m   RA Area:     14.40 cm LA Vol (A2C):   63.8 ml 39.25 ml/m  RA Volume:   32.00 ml  19.69 ml/m LA Vol (A4C):   44.9 ml 27.62 ml/m LA Biplane Vol: 53.9 ml 33.16 ml/m  AORTIC VALVE                    PULMONIC VALVE AV Area (Vmax):    2.09 cm     PV Vmax:        1.09 m/s AV Area (Vmean):   2.03 cm     PV Vmean:       75.700 cm/s AV Area (VTI):     2.11 cm     PV VTI:         0.191 m AV Vmax:           123.00 cm/s  PV Peak grad:   4.8 mmHg AV Vmean:          85.600 cm/s  PV Mean grad:   3.0 mmHg AV VTI:            0.231 m      RVOT Peak grad: 3 mmHg AV Peak Grad:      6.1 mmHg AV Mean Grad:      3.0 mmHg LVOT Vmax:         113.00 cm/s LVOT Vmean:        76.600 cm/s LVOT VTI:          0.215 m LVOT/AV VTI ratio: 0.93 AI PHT:            387 msec  AORTA Ao Root diam: 2.90 cm Ao Asc diam:  3.40 cm MITRAL VALVE MV Area (PHT): 3.72 cm     SHUNTS MV Area VTI:   1.50 cm     Systemic VTI:  0.22 m MV Peak grad:  10.5 mmHg    Systemic Diam: 1.70 cm MV Mean grad:  4.0 mmHg     Pulmonic VTI:  0.127 m MV Vmax:       1.62 m/s MV  Vmean:      93.6 cm/s MV Decel Time: 204 msec MV E velocity: 70.60 cm/s MV A velocity: 143.00 cm/s MV E/A ratio:  0.49 Redell Cave MD Electronically signed by Redell Cave MD Signature Date/Time: 11/11/2024/1:16:57 PM    Final     Scheduled Meds:  busPIRone   5 mg Oral TID   Chlorhexidine  Gluconate Cloth  6 each Topical QHS   citalopram   40 mg Oral Daily   famotidine   20 mg Oral Daily   fluticasone  furoate-vilanterol  1 puff Inhalation Daily   gabapentin   200 mg Oral BID   guaiFENesin   600 mg Oral BID   latanoprost   1 drop Both Eyes QHS   levothyroxine   50 mcg Oral Q0600   linaclotide   145 mcg Oral QAC breakfast   methylPREDNISolone  (SOLU-MEDROL ) injection  40 mg Intravenous Q12H   mirtazapine   7.5 mg Oral QHS   pantoprazole  (PROTONIX ) IV  40 mg Intravenous Q24H   polyethylene glycol  17 g Oral Daily   senna  1 tablet Oral Daily   sodium chloride  HYPERTONIC  4 mL Nebulization BID   Continuous Infusions:  azithromycin  Stopped (11/12/24 1049)   cefTRIAXone  (ROCEPHIN )  IV Stopped (11/12/24 0919)   heparin  1,300 Units/hr (11/12/24 1541)   vancomycin        LOS: 3 days  MDM: Patient is high risk for one or more organ failure.  They necessitate ongoing hospitalization for continued IV therapies and subsequent lab monitoring. Total time spent interpreting labs and vitals, reviewing the medical record, coordinating care amongst consultants and care team members, directly assessing and discussing care with the patient and/or family: 55 min  Laree Lock, MD [facility]  To contact the attending physician between 7A-7P please use Epic Chat. To contact the covering physician during after hours 7P-7A, please review Amion.  11/12/2024, 6:15 PM   *This document has been created with the assistance of dictation software. Please excuse typographical errors. *   "

## 2024-11-12 NOTE — Progress Notes (Signed)
 ANTICOAGULATION CONSULT NOTE  Pharmacy Consult for heparin  infusion Indication: NSTEMI  Allergies[1]  Patient Measurements: Height: 4' 11 (149.9 cm) Weight: 67.4 kg (148 lb 9.4 oz) IBW/kg (Calculated) : 43.2 HEPARIN  DW (KG): 57.7  Vital Signs: Temp: 98 F (36.7 C) (12/27 0000) Temp Source: Oral (12/27 0000) BP: 118/56 (12/27 0100) Pulse Rate: 64 (12/27 0100)  Labs: Recent Labs    11/10/24 0252 11/10/24 1227 11/10/24 2156 11/11/24 0428 11/12/24 0359  HGB 11.9*  --   --  12.9 11.1*  HCT 35.5*  --   --  38.0 34.1*  PLT 236  --   --  262 199  HEPARINUNFRC 0.25*   < > 0.33 0.32 0.36  CREATININE 1.04*  --   --  1.02* 1.07*   < > = values in this interval not displayed.    Estimated Creatinine Clearance: 29.8 mL/min (A) (by C-G formula based on SCr of 1.07 mg/dL (H)).   Medical History: Past Medical History:  Diagnosis Date   Abnormal SPEP    Allergic rhinitis, cause unspecified    Allergic rhinitis, cause unspecified    Arthritis    Atherosclerosis of aorta    Back pain    lumbar   Degeneration of lumbar or lumbosacral intervertebral disc    Diverticulitis of colon (without mention of hemorrhage)(562.11)    Dizziness and giddiness    Dysthymic disorder    Headache(784.0)    Myalgia and myositis, unspecified    Osteoporosis, unspecified    Other and unspecified hyperlipidemia    Other diseases of nasal cavity and sinuses(478.19)    Other specified disorders of bladder    Syncope    Trigger finger (acquired)    Unspecified essential hypertension    Unspecified sleep apnea    Urinary frequency     Medications:  PTA Meds: Hx of Apixaban  5 mg BID for DVT in March 2024, but last dose unknown and no recent dispense hx.  Assessment: Pt is a 89 yo female presenting to ED c/o respiratory distress, found with elevated BNP and Troponin level, trending up. PMH notable for HFpEF, CKD 3b, and DVT in 2024 s/p Eliquis .  12/24 Baseline hgb 12.7, plt 232, aPTT 33, and  HL <0.1. Tropnin continuing to trend up 496.  Goal of Therapy:  heparin  level 0.3-0.7 units/ml Monitor platelets by anticoagulation protocol: Yes   Date    Time    aPTT    Rate/comment 12/24   1518    <0.10   SUBtherapeutic @ 750 units/hr 12/25 0252 0.25 Subtherapeutic 12/25  2159 0.33 Therapeutic x 1 12/26 0428 0.32 Therapeutic x 2 12/27 0359 0.36 Therapeutic x 3  Plan:  Will continue heparin  infusion rate at 1300 units/hr Will check heparin  level daily w/ AM labs while therapeutic CBC daily while on heparin   Rankin CANDIE Dills, PharmD, [facility] 11/12/2024 5:14 AM             [1]  Allergies Allergen Reactions   Amoxicillin    Clindamycin/Lincomycin    Doxycycline    Levaquin [Levofloxacin In D5w]    Mirabegron Nausea And Vomiting   Nsaids Other (See Comments)    GI upset   Oxycodone  Nausea Only    Other reaction(s): Hallucination   Phenergan [Promethazine Hcl]    Singulair [Montelukast]    Vicodin [Hydrocodone-Acetaminophen ]    Zocor [Simvastatin]    Zoloft  [Sertraline] Other (See Comments)   Ampicillin Rash   Bactrim [Sulfamethoxazole-Trimethoprim] Rash   Levofloxacin Other (See Comments)  Weakness   Penicillins Rash    Ampicillin, Clindamycin, Doxycycline Levaquin-severe headache, muscle and joint aches, weak   Tolmetin     Other reaction(s): Other (See Comments) GI upset

## 2024-11-12 NOTE — Plan of Care (Signed)

## 2024-11-13 ENCOUNTER — Inpatient Hospital Stay: Payer: Medicare (Managed Care)

## 2024-11-13 DIAGNOSIS — J189 Pneumonia, unspecified organism: Secondary | ICD-10-CM | POA: Diagnosis not present

## 2024-11-13 LAB — BASIC METABOLIC PANEL WITH GFR
Anion gap: 14 (ref 5–15)
BUN: 43 mg/dL — ABNORMAL HIGH (ref 8–23)
CO2: 25 mmol/L (ref 22–32)
Calcium: 9.3 mg/dL (ref 8.9–10.3)
Chloride: 103 mmol/L (ref 98–111)
Creatinine, Ser: 1.11 mg/dL — ABNORMAL HIGH (ref 0.44–1.00)
GFR, Estimated: 47 mL/min — ABNORMAL LOW
Glucose, Bld: 150 mg/dL — ABNORMAL HIGH (ref 70–99)
Potassium: 3.6 mmol/L (ref 3.5–5.1)
Sodium: 141 mmol/L (ref 135–145)

## 2024-11-13 LAB — CULTURE, RESPIRATORY W GRAM STAIN

## 2024-11-13 LAB — CBC
HCT: 35.5 % — ABNORMAL LOW (ref 36.0–46.0)
Hemoglobin: 12 g/dL (ref 12.0–15.0)
MCH: 30 pg (ref 26.0–34.0)
MCHC: 33.8 g/dL (ref 30.0–36.0)
MCV: 88.8 fL (ref 80.0–100.0)
Platelets: 188 K/uL (ref 150–400)
RBC: 4 MIL/uL (ref 3.87–5.11)
RDW: 15 % (ref 11.5–15.5)
WBC: 7.8 K/uL (ref 4.0–10.5)
nRBC: 0 % (ref 0.0–0.2)

## 2024-11-13 LAB — MAGNESIUM: Magnesium: 2.1 mg/dL (ref 1.7–2.4)

## 2024-11-13 LAB — HEPARIN LEVEL (UNFRACTIONATED): Heparin Unfractionated: 0.48 [IU]/mL (ref 0.30–0.70)

## 2024-11-13 LAB — GLUCOSE, CAPILLARY: Glucose-Capillary: 151 mg/dL — ABNORMAL HIGH (ref 70–99)

## 2024-11-13 MED ORDER — POTASSIUM CHLORIDE CRYS ER 20 MEQ PO TBCR
20.0000 meq | EXTENDED_RELEASE_TABLET | Freq: Once | ORAL | Status: AC
  Administered 2024-11-13: 20 meq via ORAL
  Filled 2024-11-13: qty 1

## 2024-11-13 MED ORDER — APIXABAN 5 MG PO TABS
5.0000 mg | ORAL_TABLET | Freq: Two times a day (BID) | ORAL | Status: DC
  Administered 2024-11-13 – 2024-11-14 (×3): 5 mg via ORAL
  Filled 2024-11-13 (×3): qty 1

## 2024-11-13 MED ORDER — AMLODIPINE BESYLATE 5 MG PO TABS
5.0000 mg | ORAL_TABLET | Freq: Every day | ORAL | Status: DC
  Administered 2024-11-14: 5 mg via ORAL
  Filled 2024-11-13: qty 1

## 2024-11-13 MED ORDER — ENSURE PLUS HIGH PROTEIN PO LIQD
237.0000 mL | Freq: Two times a day (BID) | ORAL | Status: DC
  Administered 2024-11-13 (×2): 237 mL via ORAL

## 2024-11-13 NOTE — Progress Notes (Signed)
 ANTICOAGULATION CONSULT NOTE  Pharmacy Consult for heparin  infusion Indication: NSTEMI  Allergies[1]  Patient Measurements: Height: 4' 11 (149.9 cm) Weight: 67.2 kg (148 lb 2.4 oz) IBW/kg (Calculated) : 43.2 HEPARIN  DW (KG): 57.7  Vital Signs: Temp: 98.4 F (36.9 C) (12/27 2000) Temp Source: Oral (12/27 2000) BP: 133/44 (12/28 0220) Pulse Rate: 71 (12/28 0325)  Labs: Recent Labs    11/11/24 0428 11/12/24 0359 11/13/24 0333  HGB 12.9 11.1* 12.0  HCT 38.0 34.1* 35.5*  PLT 262 199 188  HEPARINUNFRC 0.32 0.36 0.48  CREATININE 1.02* 1.07* 1.11*    Estimated Creatinine Clearance: 28.6 mL/min (A) (by C-G formula based on SCr of 1.11 mg/dL (H)).   Medical History: Past Medical History:  Diagnosis Date   Abnormal SPEP    Allergic rhinitis, cause unspecified    Allergic rhinitis, cause unspecified    Arthritis    Atherosclerosis of aorta    Back pain    lumbar   Degeneration of lumbar or lumbosacral intervertebral disc    Diverticulitis of colon (without mention of hemorrhage)(562.11)    Dizziness and giddiness    Dysthymic disorder    Headache(784.0)    Myalgia and myositis, unspecified    Osteoporosis, unspecified    Other and unspecified hyperlipidemia    Other diseases of nasal cavity and sinuses(478.19)    Other specified disorders of bladder    Syncope    Trigger finger (acquired)    Unspecified essential hypertension    Unspecified sleep apnea    Urinary frequency     Medications:  PTA Meds: Hx of Apixaban  5 mg BID for DVT in March 2024, but last dose unknown and no recent dispense hx.  Assessment: Pt is a 89 yo female presenting to ED c/o respiratory distress, found with elevated BNP and Troponin level, trending up. PMH notable for HFpEF, CKD 3b, and DVT in 2024 s/p Eliquis .  12/24 Baseline hgb 12.7, plt 232, aPTT 33, and HL <0.1. Tropnin continuing to trend up 496.  Goal of Therapy:  heparin  level 0.3-0.7 units/ml Monitor platelets by  anticoagulation protocol: Yes   Date    Time    aPTT    Rate/comment 12/24   1518    <0.10   SUBtherapeutic @ 750 units/hr 12/25 0252 0.25 Subtherapeutic 12/25  2159 0.33 Therapeutic x 1 12/26 0428 0.32 Therapeutic x 2 12/27 0359 0.36 Therapeutic x 3 12/28 0333 0.48 Therapeutic x 4  Plan:  Will continue heparin  infusion rate at 1300 units/hr Will check heparin  level daily w/ AM labs while therapeutic CBC daily while on heparin   Rankin CANDIE Dills, PharmD, [facility] 11/13/2024 4:52 AM              [1]  Allergies Allergen Reactions   Amoxicillin    Clindamycin/Lincomycin    Doxycycline    Levaquin [Levofloxacin In D5w]    Mirabegron Nausea And Vomiting   Nsaids Other (See Comments)    GI upset   Oxycodone  Nausea Only    Other reaction(s): Hallucination   Phenergan [Promethazine Hcl]    Singulair [Montelukast]    Vicodin [Hydrocodone-Acetaminophen ]    Zocor [Simvastatin]    Zoloft  [Sertraline] Other (See Comments)   Ampicillin Rash   Bactrim [Sulfamethoxazole-Trimethoprim] Rash   Levofloxacin Other (See Comments)    Weakness   Penicillins Rash    Ampicillin, Clindamycin, Doxycycline Levaquin-severe headache, muscle and joint aches, weak   Tolmetin     Other reaction(s): Other (See Comments) GI upset

## 2024-11-13 NOTE — Progress Notes (Addendum)
 Physical Therapy Treatment Patient Details Name: Helen Ramsey MRN: [id number] DOB: [birth year] Today's Date: 11/13/2024   History of Present Illness Helen Ramsey is a 89 y.o. female with medical history significant of chronic HFpEF, HTN, chronic hypoxic respiratory failure on 2 L continuously, CKD stage IIIb, RA on methotrexate , presented with worsening of cough shortness of breath and fever.    PT Comments   Pt pleasant and willing to work with PT but does report feeling very fatigued and as though she could not do a lot.  She ultimately was able to ambulate much more than expected (~96ft) on 2L O2 with SpO2 remaining in the high 90s but was very fatigued with the modest effort.  Pt reliant on walker but with no LOBs or overt safety issues.  Pt remains far from her baseline, will need ongoing PT to address functional limitations, continue with POC.    If plan is discharge home, recommend the following: A little help with walking and/or transfers;A little help with bathing/dressing/bathroom;Assistance with cooking/housework;Assist for transportation   Can travel by private vehicle     No  Equipment Recommendations  Rolling walker (2 wheels)    Recommendations for Other Services       Precautions / Restrictions Precautions Precautions: Fall Restrictions Weight Bearing Restrictions Per Provider Order: No     Mobility  Bed Mobility Overal bed mobility: Needs Assistance Bed Mobility: Supine to Sit, Sit to Supine     Supine to sit: Min assist Sit to supine: Min assist   General bed mobility comments: slow to transition, but needed only minimal phyiscal assist    Transfers Overall transfer level: Needs assistance Equipment used: Rolling walker (2 wheels) Transfers: Sit to/from Stand Sit to Stand: Min assist           General transfer comment: hesitant to do a lot (feeling weak) but able to rise to standing with only light assist     Ambulation/Gait Ambulation/Gait assistance: Contact guard assist Gait Distance (Feet): 40 Feet Assistive device: Rolling walker (2 wheels)         General Gait Details: Pt states she has been very fatigued with the effort of getting to/from [facility] earlier today but willing to try some ambulation.  She did become very fatigued with the effort but managed ~40 ft with slow and deliberate effort.  On 2L O2, SpO2 remains mid/high 90s pre and post ambulation.   Stairs             Wheelchair Mobility     Tilt Bed    Modified Rankin (Stroke Patients Only)       Balance Overall balance assessment: Needs assistance   Sitting balance-Leahy Scale: Good Sitting balance - Comments: able to maintain sitting EOB w/o assist, light UE use     Standing balance-Leahy Scale: Fair Standing balance comment: reliant on walker, fatigued but no LOBs during standing/ambulation tasks                            Communication Communication Communication: No apparent difficulties  Cognition Arousal: Alert Behavior During Therapy: WFL for tasks assessed/performed   PT - Cognitive impairments: No apparent impairments                         Following commands: Intact      Cueing Cueing Techniques: Verbal cues  Exercises      General Comments  Pertinent Vitals/Pain Pain Assessment Pain Assessment: Faces  Faces Pain Scale: Hurts a little bit    Home Living                          Prior Function            PT Goals (current goals can now be found in the care plan section) Progress towards PT goals: Progressing toward goals    Frequency    Min 2X/week      PT Plan      Co-evaluation              AM-PAC PT 6 Clicks Mobility   Outcome Measure  Help needed turning from your back to your side while in a flat bed without using bedrails?: A Little Help needed moving from lying on your back to sitting on the side of a flat  bed without using bedrails?: A Little Help needed moving to and from a bed to a chair (including a wheelchair)?: A Little Help needed standing up from a chair using your arms (e.g., wheelchair or bedside chair)?: A Little Help needed to walk in hospital room?: A Little Help needed climbing 3-5 steps with a railing? : A Lot 6 Click Score: 17    End of Session Equipment Utilized During Treatment: Gait belt;Oxygen  (2L) Activity Tolerance: Patient tolerated treatment well;Patient limited by fatigue Patient left: with bed alarm set;with call bell/phone within reach (reports she sat up for a few hours earlier today and did not wish to sit up post PT) Nurse Communication: Mobility status PT Visit Diagnosis: Muscle weakness (generalized) (M62.81);Difficulty in walking, not elsewhere classified (R26.2)     Time: 8389-8371 PT Time Calculation (min) (ACUTE ONLY): 18 min  Charges:    $Gait Training: 8-22 mins PT General Charges $$ ACUTE PT VISIT: 1 Visit                     Carmin JONELLE Deed, DPT 11/13/2024, 5:49 PM

## 2024-11-13 NOTE — Progress Notes (Signed)
 Patient ID: CLAY MENSER, female   DOB: [birth year], 89 y.o.   MRN: [id number] [facility] Card[facility]    SUBJECTIVE: Resting comfortably in bed no pain improved shortness of breath no leg swelling denies fever chills or sweats recently transition from ICU to floor bed   Vitals:   11/13/24 0500 11/13/24 0722 11/13/24 0805 11/13/24 1115  BP:   (!) 148/57 (!) 150/98  Pulse: 64  71 73  Resp: (!) 22  13 (!) 23  Temp:   (!) 97.5 F (36.4 C) (!) 97.4 F (36.3 C)  TempSrc:   Oral Oral  SpO2: 96% 98% 97% 96%  Weight:      Height:         Intake/Output Summary (Last 24 hours) at 11/13/2024 1134 Last data filed at 11/13/2024 1129 Gross per 24 hour  Intake 1734.12 ml  Output 2293 ml  Net -558.88 ml      PHYSICAL EXAM  General: Well developed, well nourished, in no acute distress HEENT:  Normocephalic and atramatic Neck:  No JVD.  Lungs: Clear bilaterally to auscultation and percussion. Heart: HRRR . Normal S1 and S2 without gallops or murmurs.  Abdomen: Bowel sounds are positive, abdomen soft and non-tender  Msk:  Back normal, normal gait. Normal strength and tone for age. Extremities: No clubbing, cyanosis or edema.   Neuro: Alert and oriented X 3. Psych:  Good affect, responds appropriately   LABS: Basic Metabolic Panel: Recent Labs    11/11/24 0428 11/12/24 0359 11/13/24 0333 11/13/24 1030  NA 141 141 141  --   K 3.2* 4.0 3.6  --   CL 100 103 103  --   CO2 29 28 25   --   GLUCOSE 170* 134* 150*  --   BUN 30* 38* 43*  --   CREATININE 1.02* 1.07* 1.11*  --   CALCIUM  9.0 9.1 9.3  --   MG 2.1  --   --  2.1   Liver Function Tests: Recent Labs    11/12/24 0359  AST 50*  ALT 113*  ALKPHOS 82  BILITOT 0.4  PROT 6.2*  ALBUMIN 3.3*   No results for input(s): LIPASE, AMYLASE in the last 72 hours. CBC: Recent Labs    11/12/24 0359 11/13/24 0333  WBC 7.6 7.8  HGB 11.1* 12.0  HCT 34.1* 35.5*  MCV 91.2 88.8  PLT 199 188   Cardiac Enzymes: No results for  input(s): CKTOTAL, CKMB, CKMBINDEX, TROPONINI in the last 72 hours. BNP: Invalid input(s): POCBNP D-Dimer: No results for input(s): DDIMER in the last 72 hours. Hemoglobin A1C: No results for input(s): HGBA1C in the last 72 hours. Fasting Lipid Panel: No results for input(s): CHOL, HDL, LDLCALC, TRIG, CHOLHDL, LDLDIRECT in the last 72 hours. Thyroid  Function Tests: No results for input(s): TSH, T4TOTAL, T3FREE, THYROIDAB in the last 72 hours.  Invalid input(s): FREET3 Anemia Panel: No results for input(s): VITAMINB12, FOLATE, FERRITIN, TIBC, IRON, RETICCTPCT in the last 72 hours.  CT HEAD WO CONTRAST ( ) Result Date: 11/11/2024 EXAM: CT HEAD WITHOUT CONTRAST 11/11/2024 03:22:10 PM TECHNIQUE: CT of the head was performed without the administration of intravenous contrast. Automated exposure control, iterative reconstruction, and/or weight based adjustment of the mA/kV was utilized to reduce the radiation dose to as low as reasonably achievable. COMPARISON: 09/09/2024 CLINICAL HISTORY: lethargic, encephalopathy FINDINGS: BRAIN AND VENTRICLES: No acute hemorrhage. No evidence of acute infarct. No hydrocephalus. No extra-axial collection. No mass effect or midline shift. Stable background of moderate chronic small vessel disease.  Mild generalized cerebral atrophy. ORBITS: No acute abnormality. SINUSES: Moderate pansinus disease with air fluid levels throughout. SOFT TISSUES AND SKULL: No acute soft tissue abnormality. No skull fracture. Unchanged chronic defect in the anterior nasal septum. Bilateral middle ear effusions. IMPRESSION: 1. No acute intracranial abnormality. 2. Moderate pansinusitis with air-fluid levels throughout and bilateral middle ear effusions. Electronically signed by: [name] MD 11/11/2024 03:33 PM EST RP Workstation: [id number]   ECHOCARDIOGRAM COMPLETE Result Date: 11/11/2024    ECHOCARDIOGRAM REPORT   Patient Name:    WONDER DONAWAY Date of Exam: 11/11/2024 Medical Rec #:  [id number]        Height:       59.0 in Accession #:    [id number]       Weight:       148.6 lb Date of Birth:  01/26/1933        BSA:          1.626 m Patient Age:    89 years         BP:           145/64 mmHg Patient Gender: F                HR:           83 bpm. Exam Location:  [facility] Procedure: 2D Echo, Cardiac Doppler, Color Doppler and Intracardiac            Opacification Agent (Both Spectral and Color Flow Doppler were            utilized during procedure). Indications:     Congestive Heart Failure I50.9  History:         Patient has prior history of Echocardiogram examinations, most                  recent 12/11/2022. CHF; Signs/Symptoms:Dyspnea.  Sonographer:     Ashley McNeely-Sloane Referring Phys:  [id number] [facility] PONNALA Diagnosing Phys: Redell Cave MD IMPRESSIONS  1. Left ventricular ejection fraction, by estimation, is 50 to 55%. The left ventricle has normal function. The left ventricle demonstrates regional wall motion abnormalities (see scoring diagram/findings for description). Left ventricular diastolic parameters are consistent with Grade I diastolic dysfunction (impaired relaxation). There is severe hypokinesis of the left ventricular, mid-apical anteroseptal wall and apical segment.  2. Right ventricular systolic function is normal. The right ventricular size is normal.  3. The mitral valve is degenerative. No evidence of mitral valve regurgitation.  4. The aortic valve is tricuspid. Aortic valve regurgitation is mild. Aortic valve sclerosis is present, with no evidence of aortic valve stenosis. FINDINGS  Left Ventricle: Left ventricular ejection fraction, by estimation, is 50 to 55%. The left ventricle has normal function. The left ventricle demonstrates regional wall motion abnormalities. Severe hypokinesis of the left ventricular, mid-apical anteroseptal wall and apical segment. Definity  contrast agent was given IV to delineate  the left ventricular endocardial borders. The left ventricular internal cavity size was normal in size. There is no left ventricular hypertrophy. Left ventricular diastolic parameters are consistent with Grade I diastolic dysfunction (impaired relaxation). Right Ventricle: The right ventricular size is normal. No increase in right ventricular wall thickness. Right ventricular systolic function is normal. Left Atrium: Left atrial size was normal in size. Right Atrium: Right atrial size was normal in size. Pericardium: There is no evidence of pericardial effusion. Mitral Valve: The mitral valve is degenerative in appearance. Mild to moderate mitral annular calcification. No evidence of mitral valve regurgitation. MV peak  gradient, 10.5 mmHg. The mean mitral valve gradient is 4.0 mmHg. Tricuspid Valve: The tricuspid valve is normal in structure. Tricuspid valve regurgitation is not demonstrated. Aortic Valve: The aortic valve is tricuspid. Aortic valve regurgitation is mild. Aortic regurgitation PHT measures 387 msec. Aortic valve sclerosis is present, with no evidence of aortic valve stenosis. Aortic valve mean gradient measures 3.0 mmHg. Aortic valve peak gradient measures 6.1 mmHg. Aortic valve area, by VTI measures 2.11 cm. Pulmonic Valve: The pulmonic valve was not well visualized. Pulmonic valve regurgitation is not visualized. Aorta: The aortic root and ascending aorta are structurally normal, with no evidence of dilitation. Venous: The inferior vena cava was not well visualized. IAS/Shunts: No atrial level shunt detected by color flow Doppler.  LEFT VENTRICLE PLAX 2D LVIDd:         4.20 cm     Diastology LVIDs:         2.30 cm     LV e' medial:    3.29 cm/s LV PW:         0.90 cm     LV E/e' medial:  21.5 LV IVS:        1.40 cm     LV e' lateral:   5.92 cm/s LVOT diam:     1.70 cm     LV E/e' lateral: 11.9 LV SV:         49 LV SV Index:   30 LVOT Area:     2.27 cm  LV Volumes (MOD) LV vol d, MOD A2C: 54.6 ml  LV vol d, MOD A4C: 76.1 ml LV vol s, MOD A2C: 38.1 ml LV vol s, MOD A4C: 38.4 ml LV SV MOD A2C:     16.5 ml LV SV MOD A4C:     76.1 ml LV SV MOD BP:      28.5 ml RIGHT VENTRICLE RV Basal diam:  3.90 cm RV Mid diam:    3.90 cm RV S prime:     11.20 cm/s TAPSE (M-mode): 1.6 cm LEFT ATRIUM             Index        RIGHT ATRIUM           Index LA diam:        4.00 cm 2.46 cm/m   RA Area:     14.40 cm LA Vol (A2C):   63.8 ml 39.25 ml/m  RA Volume:   32.00 ml  19.69 ml/m LA Vol (A4C):   44.9 ml 27.62 ml/m LA Biplane Vol: 53.9 ml 33.16 ml/m  AORTIC VALVE                    PULMONIC VALVE AV Area (Vmax):    2.09 cm     PV Vmax:        1.09 m/s AV Area (Vmean):   2.03 cm     PV Vmean:       75.700 cm/s AV Area (VTI):     2.11 cm     PV VTI:         0.191 m AV Vmax:           123.00 cm/s  PV Peak grad:   4.8 mmHg AV Vmean:          85.600 cm/s  PV Mean grad:   3.0 mmHg AV VTI:            0.231 m      RVOT Peak grad:  3 mmHg AV Peak Grad:      6.1 mmHg AV Mean Grad:      3.0 mmHg LVOT Vmax:         113.00 cm/s LVOT Vmean:        76.600 cm/s LVOT VTI:          0.215 m LVOT/AV VTI ratio: 0.93 AI PHT:            387 msec  AORTA Ao Root diam: 2.90 cm Ao Asc diam:  3.40 cm MITRAL VALVE MV Area (PHT): 3.72 cm     SHUNTS MV Area VTI:   1.50 cm     Systemic VTI:  0.22 m MV Peak grad:  10.5 mmHg    Systemic Diam: 1.70 cm MV Mean grad:  4.0 mmHg     Pulmonic VTI:  0.127 m MV Vmax:       1.62 m/s MV Vmean:      93.6 cm/s MV Decel Time: 204 msec MV E velocity: 70.60 cm/s MV A velocity: 143.00 cm/s MV E/A ratio:  0.49 Redell Cave MD Electronically signed by Redell Cave MD Signature Date/Time: 11/11/2024/1:16:57 PM    Final      Echo mild reduced left ventricular function EF around 50 to 55% mild aortic stenosis grade 1 diastolic dysfunction  TELEMETRY: Normal sinus rhythm rate of 75:  ASSESSMENT AND PLAN:  Principal Problem:   Pneumonia Active Problems:   CHF (congestive heart failure) (HCC)   PNA  (pneumonia)   NSTEMI (non-ST elevated myocardial infarction) (HCC) COPD Aortic stenosis Chronic renal sufficiency Rheumatoid arthritis Obesity  Plan Continue current therapy for HFpEF with diuresis supplemental oxygen  respiratory support Acute on chronic hypoxic respiratory failure on 2 L nasal cannula chronically continue oxygen  support inhalers diuretics Chronic insufficiency stage III continue renal perfusion consider nephrology input Rheumatoid arthritis has been on methotrexate  have the patient follow-up with rheumatology Shortness of breath dyspnea multifactorial COPD aortic stenosis pneumonia heart failure continue supplemental oxygen  support  Diuresis Pneumonia multifocal continue broad-spectrum antibiotic therapy respiratory support Acute supplemental steroid therapy to help with risk to support transition to p.o. prednisone  taper Hypertension continue antihypertensive therapy Patient not likely to benefit from ischemia workup   Cara JONETTA Lovelace, MD, 11/13/2024 11:34 AM

## 2024-11-13 NOTE — Plan of Care (Signed)

## 2024-11-13 NOTE — Progress Notes (Signed)
 " PROGRESS NOTE    Helen Ramsey  FMW:[id number] DOB: 1932-12-04 DOA: 11/09/2024 PCP: Myrla Jon CHRISTELLA, MD  Chief Complaint  Patient presents with   Respiratory Distress    Hospital Course:  Helen Ramsey is a 89 y.o. female with medical history significant of chronic HFpEF, HTN, chronic hypoxic respiratory failure on 2 L continuously, CKD stage IIIb, RA on methotrexate , presented with worsening of cough shortness of breath and fever.  Patient has been having productive cough for about 1 week, started to have yellow sputum production and dyspnea on exertion.  Patient was admitted due to acute on chronic hypoxic respiratory failure, multifocal pneumonia,  Hospital course as below  Subjective: Patient was examined at the bedside, reports feeling significantly better today Denies any complaints today Denies nausea/vomiting, able to void and had BM.  Abdomen distended -will get x-ray [facility] [name], provided updates and answered all questions  Objective: Vitals:   11/13/24 0805 11/13/24 1115 11/13/24 1231 11/13/24 1527  BP: (!) 148/57 (!) 150/98 130/78 (!) 158/68  Pulse: 71 73 71 70  Resp: 13 (!) 23 17 18   Temp: (!) 97.5 F (36.4 C) (!) 97.4 F (36.3 C) 97.8 F (36.6 C) 97.8 F (36.6 C)  TempSrc: Oral Oral    SpO2: 97% 96% 97% 99%  Weight:      Height:        Intake/Output Summary (Last 24 hours) at 11/13/2024 1709 Last data filed at 11/13/2024 1129 Gross per 24 hour  Intake 1182.18 ml  Output 1325 ml  Net -142.82 ml   Filed Weights   11/10/24 1712 11/11/24 0500 11/12/24 0500  Weight: 66.2 kg 67.4 kg 67.2 kg    Examination: General exam: Calm, comfortable, no acute distress Neck: normal, supple, no masses, no thyromegaly Respiratory: Diffuse bilateral wheezing, bibasilar crackles present Cardiovascular: Regular rate and rhythm, no murmurs / rubs / gallops.  1+ extremity edema predominantly on the left ankle. 2+ pedal pulses. No carotid bruits.  Abdomen: no  tenderness, no masses palpated, distended. No hepatosplenomegaly. Bowel sounds positive.  Musculoskeletal: no clubbing / cyanosis. No joint deformity upper and lower extremities. Good ROM, no contractures. Normal muscle tone.  Skin: no rashes, lesions, ulcers. No induration Neurologic: No gross focal deficits  Assessment & Plan:  Acute on chronic hypoxic respiratory failure Suspect multifactorial due to multifocal pneumonia, metapneumovirus infection, asthma/COPD exacerbation, acute on chronic HFpEF Was on BiPAP on presentation, was able to wean to 2L [place] Baseline on 2 L [place] supplemental oxygen   Severe Sepsis due to Multifocal pneumonia Metapneumovirus infection Lactic acidosis improving, Leukocytosis resolved MRSA nares positive continue vancomycin , ceftriaxone , azithromycin   Legionella, mycoplasma negative Follow respiratory culture, blood culture Seen by SLP  Acute asthma/COPD exacerbation Continue po prednisone , azithromycin  Home inhalers, DuoNebs as needed Mucinex  Incentive spirometry, flutter valve, hypertonic saline nebs  Acute on chronic HFpEF BNP elevated S/p IV Lasix , on p.o. torsemide  Monitor daily weight, intake/output  Elevated troponin Troponin uptrending, denies chest pain Suspect demand ischemia due to pneumonia, CHF, asthma/COPD exacerbation Echo shows EF 50 to 55%, severe hypokinesis of LV, mid apical anterior septal wall, grade 1 DD, mild AR Discussed with cardiology -unlikely to benefit from ischemic workup S/p IV heparin  Cardiology following, appreciate recs  Hypokalemia Monitor and replete as needed  CKD stage 3a/b Monitor creatinine, stable  Acute transaminitis - improving Suspect atypical pneumonia US  abd status postcholecystectomy, probable hepatic steatosis Hold off statin Trend LFT's  Abdominal distention X-ray pending Denies pain, nausea/vomiting, passing gas and  having BM Able to void with no difficulty  HLD LDL 123, not compliant  with statins  HTN Increase amlodipine  5 mg  History of DVT Resume Eliquis   Anxiety/depression Continue SSRI, Buspar   GERD Pepcid , PPI  Rheumatoid arthritis Hold Methotrexate   Obesity class I Body mass index is 32.24 kg/m. - Outpatient follow up for lifestyle modification and risk factor management  PT/OT eval   DVT prophylaxis: IV Heparin    Code Status: Limited: Do not attempt resuscitation (DNR) -DNR-LIMITED -Do Not Intubate/DNI  Disposition: Return to [employer]:  Cardiology  Procedures:  None  Antimicrobials:  Anti-infectives (From admission, onward)    Start     Dose/Rate Route Frequency Ordered Stop   11/12/24 2000  vancomycin  (VANCOREADY) IVPB 1250 mg/250 mL        1,250 mg 166.7 mL/hr over 90 Minutes Intravenous Every 48 hours 11/11/24 0846     11/11/24 1900  vancomycin  (VANCOREADY) IVPB 750 mg/150 mL  Status:  Discontinued        750 mg 150 mL/hr over 60 Minutes Intravenous Every 24 hours 11/10/24 1750 11/11/24 0846   11/10/24 1900  vancomycin  (VANCOREADY) IVPB 1500 mg/300 mL        1,500 mg 150 mL/hr over 120 Minutes Intravenous  Once 11/10/24 1750 11/10/24 2219   11/10/24 1000  cefTRIAXone  (ROCEPHIN ) 2 g in sodium chloride  0.9 % 100 mL IVPB        2 g 200 mL/hr over 30 Minutes Intravenous Every 24 hours 11/09/24 1125 11/13/24 1017   11/09/24 1045  cefTRIAXone  (ROCEPHIN ) 1 g in sodium chloride  0.9 % 100 mL IVPB  Status:  Discontinued        1 g 200 mL/hr over 30 Minutes Intravenous Every 24 hours 11/09/24 1037 11/09/24 1125   11/09/24 1045  azithromycin  (ZITHROMAX ) 500 mg in sodium chloride  0.9 % 250 mL IVPB        500 mg 250 mL/hr over 60 Minutes Intravenous Every 24 hours 11/09/24 1037 11/13/24 1120   11/09/24 0400  aztreonam  (AZACTAM ) 2 g in sodium chloride  0.9 % 100 mL IVPB        2 g 200 mL/hr over 30 Minutes Intravenous  Once 11/09/24 0353 11/09/24 0442   11/09/24 0400  vancomycin  (VANCOCIN ) IVPB 1000 mg/200 mL premix         1,000 mg 200 mL/hr over 60 Minutes Intravenous  Once 11/09/24 0353 11/09/24 0606       Data Reviewed: I have personally reviewed following labs and imaging studies CBC: Recent Labs  Lab 11/09/24 0356 11/10/24 0252 11/11/24 0428 11/12/24 0359 11/13/24 0333  WBC 13.7* 9.3 14.2* 7.6 7.8  NEUTROABS 12.2*  --   --   --   --   HGB 12.7 11.9* 12.9 11.1* 12.0  HCT 38.5 35.5* 38.0 34.1* 35.5*  MCV 88.7 89.4 88.6 91.2 88.8  PLT 232 236 262 199 188   Basic Metabolic Panel: Recent Labs  Lab 11/09/24 0356 11/10/24 0252 11/11/24 0428 11/12/24 0359 11/13/24 0333 11/13/24 1030  NA 138 144 141 141 141  --   K 3.9 3.6 3.2* 4.0 3.6  --   CL 98 103 100 103 103  --   CO2 26 28 29 28 25   --   GLUCOSE 117* 111* 170* 134* 150*  --   BUN 36* 38* 30* 38* 43*  --   CREATININE 1.10* 1.04* 1.02* 1.07* 1.11*  --   CALCIUM  9.6 9.3 9.0 9.1 9.3  --  MG  --   --  2.1  --   --  2.1   GFR: Estimated Creatinine Clearance: 28.6 mL/min (A) (by C-G formula based on SCr of 1.11 mg/dL (H)). Liver Function Tests: Recent Labs  Lab 11/09/24 0356 11/10/24 0252 11/12/24 0359  AST 103* 113* 50*  ALT 116* 148* 113*  ALKPHOS 132* 99 82  BILITOT 0.4 0.4 0.4  PROT 7.9 7.0 6.2*  ALBUMIN 4.1 3.7 3.3*   CBG: Recent Labs  Lab 11/10/24 1716  GLUCAP 211*    Recent Results (from the past 240 hours)  Resp panel by RT-PCR (RSV, Flu A&B, Covid) Anterior Nasal Swab     Status: None   Collection Time: 11/09/24  3:56 AM   Specimen: Anterior Nasal Swab  Result Value Ref Range Status   SARS Coronavirus 2 by RT PCR NEGATIVE NEGATIVE Final    Comment: (NOTE) SARS-CoV-2 target nucleic acids are NOT DETECTED.  The SARS-CoV-2 RNA is generally detectable in upper respiratory specimens during the acute phase of infection. The lowest concentration of SARS-CoV-2 viral copies this assay can detect is 138 copies/mL. A negative result does not preclude SARS-Cov-2 infection and should not be used as the sole basis  for treatment or other patient management decisions. A negative result may occur with  improper specimen collection/handling, submission of specimen other than nasopharyngeal swab, presence of viral mutation(s) within the areas targeted by this assay, and inadequate number of viral copies(<138 copies/mL). A negative result must be combined with clinical observations, patient history, and epidemiological information. The expected result is Negative.  Fact Sheet for Patients:  bloggercourse.com  Fact Sheet for Healthcare Providers:  seriousbroker.it  This test is no t yet approved or cleared by the [place]  FDA and  has been authorized for detection and/or diagnosis of SARS-CoV-2 by FDA under an Emergency Use Authorization (EUA). This EUA will remain  in effect (meaning this test can be used) for the duration of the COVID-19 declaration under Section 564(b)(1) of the Act, 21 U.S.C.section 360bbb-3(b)(1), unless the authorization is terminated  or revoked sooner.       Influenza A by PCR NEGATIVE NEGATIVE Final   Influenza B by PCR NEGATIVE NEGATIVE Final    Comment: (NOTE) The Xpert Xpress SARS-CoV-2/FLU/RSV plus assay is intended as an aid in the diagnosis of influenza from Nasopharyngeal swab specimens and should not be used as a sole basis for treatment. Nasal washings and aspirates are unacceptable for Xpert Xpress SARS-CoV-2/FLU/RSV testing.  Fact Sheet for Patients: bloggercourse.com  Fact Sheet for Healthcare Providers: seriousbroker.it  This test is not yet approved or cleared by the [place]  FDA and has been authorized for detection and/or diagnosis of SARS-CoV-2 by FDA under an Emergency Use Authorization (EUA). This EUA will remain in effect (meaning this test can be used) for the duration of the COVID-19 declaration under Section 564(b)(1) of the Act, 21  U.S.C. section 360bbb-3(b)(1), unless the authorization is terminated or revoked.     Resp Syncytial Virus by PCR NEGATIVE NEGATIVE Final    Comment: (NOTE) Fact Sheet for Patients: bloggercourse.com  Fact Sheet for Healthcare Providers: seriousbroker.it  This test is not yet approved or cleared by the [place]  FDA and has been authorized for detection and/or diagnosis of SARS-CoV-2 by FDA under an Emergency Use Authorization (EUA). This EUA will remain in effect (meaning this test can be used) for the duration of the COVID-19 declaration under Section 564(b)(1) of the Act, 21 U.S.C. section 360bbb-3(b)(1), unless the  authorization is terminated or revoked.  Performed at [facility], [address] [place], [place] [address]   Blood Culture (routine x 2)     Status: None (Preliminary result)   Collection Time: 11/09/24  3:56 AM   Specimen: BLOOD  Result Value Ref Range Status   Specimen Description BLOOD LEFT ANTECUBITAL  Final   Special Requests   Final    BOTTLES DRAWN AEROBIC AND ANAEROBIC Blood Culture adequate volume   Culture   Final    NO GROWTH 4 DAYS Performed at [facility], [address], [place], [place] [address]    Report Status PENDING  Incomplete  Blood Culture (routine x 2)     Status: None (Preliminary result)   Collection Time: 11/09/24  3:57 AM   Specimen: BLOOD  Result Value Ref Range Status   Specimen Description BLOOD BLOOD LEFT ARM  Final   Special Requests   Final    BOTTLES DRAWN AEROBIC AND ANAEROBIC Blood Culture adequate volume   Culture   Final    NO GROWTH 4 DAYS Performed at [facility], [address], [place], [place] [address]    Report Status PENDING  Incomplete  Respiratory (~20 pathogens) panel by PCR     Status: Abnormal   Collection Time: 11/10/24 12:40 PM   Specimen: Nasopharyngeal Swab; Respiratory  Result Value Ref Range Status    Adenovirus NOT DETECTED NOT DETECTED Final   Coronavirus 229E NOT DETECTED NOT DETECTED Final    Comment: (NOTE) The Coronavirus on the Respiratory Panel, DOES NOT test for the novel  Coronavirus (2019 nCoV)    Coronavirus HKU1 NOT DETECTED NOT DETECTED Final   Coronavirus NL63 NOT DETECTED NOT DETECTED Final   Coronavirus OC43 NOT DETECTED NOT DETECTED Final   Metapneumovirus DETECTED (A) NOT DETECTED Final   Rhinovirus / Enterovirus NOT DETECTED NOT DETECTED Final   Influenza A NOT DETECTED NOT DETECTED Final   Influenza B NOT DETECTED NOT DETECTED Final   Parainfluenza Virus 1 NOT DETECTED NOT DETECTED Final   Parainfluenza Virus 2 NOT DETECTED NOT DETECTED Final   Parainfluenza Virus 3 NOT DETECTED NOT DETECTED Final   Parainfluenza Virus 4 NOT DETECTED NOT DETECTED Final   Respiratory Syncytial Virus NOT DETECTED NOT DETECTED Final   Bordetella pertussis NOT DETECTED NOT DETECTED Final   Bordetella Parapertussis NOT DETECTED NOT DETECTED Final   Chlamydophila pneumoniae NOT DETECTED NOT DETECTED Final   Mycoplasma pneumoniae NOT DETECTED NOT DETECTED Final    Comment: Performed at [facility], [address], [place], [place] [address]  MRSA Next Gen by PCR, Nasal     Status: Abnormal   Collection Time: 11/10/24 12:40 PM   Specimen: Nasal Mucosa; Nasal Swab  Result Value Ref Range Status   MRSA by PCR Next Gen DETECTED (A) NOT DETECTED Final    Comment: RESULT CALLED TO, READ BACK BY AND VERIFIED WITH: PRAWEENA BHUSAL RN AT 1532 ON 11/10/2024 BY KC (NOTE) The GeneXpert MRSA Assay (FDA approved for NASAL specimens only), is one component of a comprehensive MRSA colonization surveillance program. It is not intended to diagnose MRSA infection nor to guide or monitor treatment for MRSA infections. Test performance is not FDA approved in patients less than 47 years old. Performed at [facility], [address] [place], [place] [address]   Expectorated Sputum  Assessment w Gram Stain, Rflx to Resp Cult     Status: None   Collection Time: 11/10/24  2:30  PM   Specimen: Sputum  Result Value Ref Range Status   Specimen Description SPUTUM  Final   Special Requests NONE  Final   Sputum evaluation   Final    THIS SPECIMEN IS ACCEPTABLE FOR SPUTUM CULTURE Performed at [facility], [address] [place], [place] [address]    Report Status 11/10/2024 FINAL  Final  Culture, Respiratory w Gram Stain     Status: None   Collection Time: 11/10/24  2:30 PM   Specimen: SPU  Result Value Ref Range Status   Specimen Description   Final    SPUTUM Performed at [facility], [address], [place], [place] [address]    Special Requests   Final    NONE Reflexed from [id number] Performed at [facility], [address] [place], [place] [address]    Gram Stain   Final    FEW WBC PRESENT, PREDOMINANTLY PMN RARE GRAM POSITIVE COCCI RARE GRAM NEGATIVE RODS Performed at [facility], [address], [place], [place] [address]    Culture RARE METHICILLIN RESISTANT STAPHYLOCOCCUS AUREUS  Final   Report Status 11/13/2024 FINAL  Final   Organism ID, Bacteria METHICILLIN RESISTANT STAPHYLOCOCCUS AUREUS  Final      Susceptibility   Methicillin resistant staphylococcus aureus - MIC*    CIPROFLOXACIN  >=8 RESISTANT Resistant     ERYTHROMYCIN >=8 RESISTANT Resistant     GENTAMICIN <=0.5 SENSITIVE Sensitive     OXACILLIN >=4 RESISTANT Resistant     TETRACYCLINE <=1 SENSITIVE Sensitive     VANCOMYCIN  1 SENSITIVE Sensitive     TRIMETH/SULFA <=10 SENSITIVE Sensitive     CLINDAMYCIN <=0.25 SENSITIVE Sensitive     RIFAMPIN <=0.5 SENSITIVE Sensitive     Inducible Clindamycin NEGATIVE Sensitive     LINEZOLID  2 SENSITIVE Sensitive     * RARE METHICILLIN RESISTANT STAPHYLOCOCCUS AUREUS     Radiology Studies: No results found.   Scheduled Meds:  [START ON 11/14/2024] amLODipine   5 mg Oral Daily   apixaban   5 mg Oral BID    busPIRone   5 mg Oral TID   Chlorhexidine  Gluconate Cloth  6 each Topical QHS   citalopram   40 mg Oral Daily   famotidine   20 mg Oral Daily   feeding supplement  237 mL Oral BID BM   fluticasone  furoate-vilanterol  1 puff Inhalation Daily   gabapentin   200 mg Oral BID   guaiFENesin   600 mg Oral BID   latanoprost   1 drop Both Eyes QHS   levothyroxine   50 mcg Oral Q0600   linaclotide   145 mcg Oral QAC breakfast   mirtazapine   7.5 mg Oral QHS   pantoprazole   40 mg Oral Daily   polyethylene glycol  17 g Oral Daily   predniSONE   40 mg Oral Q breakfast   senna  1 tablet Oral Daily   sodium chloride  HYPERTONIC  4 mL Nebulization BID   torsemide   40 mg Oral BID   Continuous Infusions:  vancomycin  Stopped (11/12/24 2138)     LOS: 4 days  MDM: Patient is high risk for one or more organ failure.  They necessitate ongoing hospitalization for continued IV therapies and subsequent lab monitoring. Total time spent interpreting labs and vitals, reviewing the medical record, coordinating care amongst consultants and care team members, directly assessing and discussing care with the patient and/or family: 55 min  Laree Lock, MD [facility]  To contact the attending physician between 7A-7P please use Epic Chat. To contact  the covering physician during after hours 7P-7A, please review Amion.  11/13/2024, 5:09 PM   *This document has been created with the assistance of dictation software. Please excuse typographical errors. *   "

## 2024-11-13 NOTE — Progress Notes (Signed)
 Pt transferred to room 147A from ICU bed 11. Pt transferred via wheelchair by this RN on 2L [place]. Telemetry notified. All pt belongings taken with pt. Pt family members at bedside.

## 2024-11-14 LAB — CULTURE, BLOOD (ROUTINE X 2)
Culture: NO GROWTH
Culture: NO GROWTH
Special Requests: ADEQUATE
Special Requests: ADEQUATE

## 2024-11-14 LAB — CBC
HCT: 37.2 % (ref 36.0–46.0)
Hemoglobin: 12.3 g/dL (ref 12.0–15.0)
MCH: 29.6 pg (ref 26.0–34.0)
MCHC: 33.1 g/dL (ref 30.0–36.0)
MCV: 89.6 fL (ref 80.0–100.0)
Platelets: 191 K/uL (ref 150–400)
RBC: 4.15 MIL/uL (ref 3.87–5.11)
RDW: 15.3 % (ref 11.5–15.5)
WBC: 7.4 K/uL (ref 4.0–10.5)
nRBC: 0 % (ref 0.0–0.2)

## 2024-11-14 LAB — COMPREHENSIVE METABOLIC PANEL WITH GFR
ALT: 120 U/L — ABNORMAL HIGH (ref 0–44)
AST: 59 U/L — ABNORMAL HIGH (ref 15–41)
Albumin: 3.6 g/dL (ref 3.5–5.0)
Alkaline Phosphatase: 81 U/L (ref 38–126)
Anion gap: 13 (ref 5–15)
BUN: 40 mg/dL — ABNORMAL HIGH (ref 8–23)
CO2: 27 mmol/L (ref 22–32)
Calcium: 9.4 mg/dL (ref 8.9–10.3)
Chloride: 101 mmol/L (ref 98–111)
Creatinine, Ser: 1.11 mg/dL — ABNORMAL HIGH (ref 0.44–1.00)
GFR, Estimated: 47 mL/min — ABNORMAL LOW
Glucose, Bld: 126 mg/dL — ABNORMAL HIGH (ref 70–99)
Potassium: 3.2 mmol/L — ABNORMAL LOW (ref 3.5–5.1)
Sodium: 142 mmol/L (ref 135–145)
Total Bilirubin: 0.4 mg/dL (ref 0.0–1.2)
Total Protein: 6.4 g/dL — ABNORMAL LOW (ref 6.5–8.1)

## 2024-11-14 LAB — GLUCOSE, CAPILLARY: Glucose-Capillary: 100 mg/dL — ABNORMAL HIGH (ref 70–99)

## 2024-11-14 LAB — HEPARIN LEVEL (UNFRACTIONATED): Heparin Unfractionated: >1.1 [IU]/mL — ABNORMAL HIGH (ref 0.30–0.70)

## 2024-11-14 MED ORDER — VANCOMYCIN HCL 1250 MG/250ML IV SOLN
1250.0000 mg | INTRAVENOUS | Status: DC
  Filled 2024-11-14: qty 250

## 2024-11-14 MED ORDER — LACTULOSE 10 GM/15ML PO SOLN
20.0000 g | Freq: Two times a day (BID) | ORAL | Status: DC
  Administered 2024-11-14: 20 g via ORAL
  Filled 2024-11-14: qty 30

## 2024-11-14 MED ORDER — PREDNISONE 20 MG PO TABS: ORAL_TABLET | ORAL | Status: AC

## 2024-11-14 MED ORDER — LINEZOLID 600 MG PO TABS
600.0000 mg | ORAL_TABLET | Freq: Two times a day (BID) | ORAL | Status: DC
  Filled 2024-11-14: qty 1

## 2024-11-14 MED ORDER — TRAMADOL HCL 50 MG PO TABS: 50.0000 mg | ORAL_TABLET | Freq: Three times a day (TID) | ORAL | 0 refills | Status: AC | PRN

## 2024-11-14 MED ORDER — MUPIROCIN 2 % EX OINT
1.0000 | TOPICAL_OINTMENT | Freq: Two times a day (BID) | CUTANEOUS | Status: DC
  Administered 2024-11-14: 1 via NASAL
  Filled 2024-11-14: qty 22

## 2024-11-14 MED ORDER — VANCOMYCIN HCL 1250 MG/250ML IV SOLN
1250.0000 mg | INTRAVENOUS | Status: DC
  Administered 2024-11-14: 1250 mg via INTRAVENOUS
  Filled 2024-11-14: qty 250

## 2024-11-14 MED ORDER — CHLORHEXIDINE GLUCONATE CLOTH 2 % EX PADS: 6.0000 | MEDICATED_PAD | Freq: Every day | CUTANEOUS | Status: DC

## 2024-11-14 MED ORDER — POTASSIUM CHLORIDE CRYS ER 20 MEQ PO TBCR
40.0000 meq | EXTENDED_RELEASE_TABLET | ORAL | Status: AC
  Administered 2024-11-14 (×2): 40 meq via ORAL
  Filled 2024-11-14 (×2): qty 2

## 2024-11-14 MED ORDER — TORSEMIDE 40 MG PO TABS: 40.0000 mg | ORAL_TABLET | Freq: Two times a day (BID) | ORAL | Status: AC

## 2024-11-14 NOTE — TOC Transition Note (Signed)
 Transition of Care [facility]) - Discharge Note   Patient Details  Name: Helen Ramsey MRN: [id number] Date of Birth: 07-12-1933  [facility] CM/SW Contact:  Alvaro Louder, LCSW Phone Number: 11/14/2024, 3:31 PM   Clinical Narrative:   LCSWA confirmed with MD that patient is stable for discharge. LCSWA notified the patient and daughter and they are in agreement with discharge . LCSWA confirmed bed is available at [facility]. Transport arranged with [facility] for next available.  [phone] RM: 107  TOC signing off  Final next level of care: Assisted Living Barriers to Discharge: No Barriers Identified   Patient Goals and CMS Choice            Discharge Placement              Patient chooses bed at: [facility] Patient to be transferred to facility by: [facility] Name of family member notified: [name] Patient and family notified of of transfer: 11/14/24  Discharge Plan and Services Additional resources added to the After Visit Summary for                                       Social Drivers of Health (SDOH) Interventions SDOH Screenings   Food Insecurity: No Food Insecurity (11/09/2024)  Housing: Low Risk (11/09/2024)  Transportation Needs: No Transportation Needs (11/09/2024)  Utilities: Not At Risk (11/09/2024)  Alcohol Screen: Low Risk (12/18/2022)  Depression (PHQ2-9): Medium Risk (12/18/2022)  Financial Resource Strain: Low Risk (05/06/2022)  Physical Activity: Insufficiently Active (05/06/2022)  Social Connections: Unknown (11/10/2024)  Stress: No Stress Concern Present (05/06/2022)  Tobacco Use: Low Risk (09/09/2024)     Readmission Risk Interventions     No data to display

## 2024-11-14 NOTE — Progress Notes (Signed)
 [facility] CLINIC CARDIOLOGY PROGRESS NOTE   Patient ID: Helen Ramsey MRN: [id number] DOB/AGE: 1933-01-26 89 y.o.  Admit date: 11/09/2024 Referring Physician Dr. Cort Mana  Primary Physician Myrla, Jon CHRISTELLA, MD  Primary Cardiologist Dr. Florencio Reason for Consultation SOB  HPI: Helen Ramsey is a 89 y.o. female with a past medical history of chronic HFpEF, HTN, chronic hypoxic respiratory failure on 2 L continuously, CKD stage IIIb, RA on methotrexate  who presented to the ED on 11/09/2024 for SOB, cough. Cardiology was consulted for further evaluation.   Interval History:  -Patient seen and examined this afternoon, resting in bed.  -States SOB mildly improved. Still with cough.  -BP elevated. HR controlled on tele with no events noted.   Review of systems complete and found to be negative unless listed above   Vitals:   11/13/24 1957 11/14/24 0343 11/14/24 0500 11/14/24 0746  BP: (!) 162/66 (!) 153/77  (!) 165/56  Pulse: 77 62  63  Resp: 18 18  14   Temp: 98.9 F (37.2 C) 97.7 F (36.5 C)  97.6 F (36.4 C)  TempSrc: Oral Oral    SpO2: 98% 94%  97%  Weight:   67.3 kg   Height:         Intake/Output Summary (Last 24 hours) at 11/14/2024 1253 Last data filed at 11/13/2024 2130 Gross per 24 hour  Intake 240 ml  Output --  Net 240 ml     PHYSICAL EXAM General: Chronically ill appearing elderly female, well nourished, in no acute distress. HEENT: Normocephalic and atraumatic. Neck: No JVD.  Lungs: Normal respiratory effort on 2L Helen Ramsey (baseline). Clear bilaterally to auscultation. No wheezes, crackles, rhonchi.  Heart: HRRR. Normal S1 and S2 without gallops or murmurs. Radial & DP pulses 2+ bilaterally. Abdomen: Non-distended appearing.  Msk: Normal strength and tone for age. Extremities: No clubbing, cyanosis or edema.   Neuro: Alert and oriented X 3. Psych: Mood appropriate, affect congruent.    LABS: Basic Metabolic Panel: Recent Labs     11/13/24 0333 11/13/24 1030 11/14/24 0917  NA 141  --  142  K 3.6  --  3.2*  CL 103  --  101  CO2 25  --  27  GLUCOSE 150*  --  126*  BUN 43*  --  40*  CREATININE 1.11*  --  1.11*  CALCIUM  9.3  --  9.4  MG  --  2.1  --    Liver Function Tests: Recent Labs    11/12/24 0359 11/14/24 0917  AST 50* 59*  ALT 113* 120*  ALKPHOS 82 81  BILITOT 0.4 0.4  PROT 6.2* 6.4*  ALBUMIN 3.3* 3.6   No results for input(s): LIPASE, AMYLASE in the last 72 hours. CBC: Recent Labs    11/13/24 0333 11/14/24 0917  WBC 7.8 7.4  HGB 12.0 12.3  HCT 35.5* 37.2  MCV 88.8 89.6  PLT 188 191   Cardiac Enzymes: No results for input(s): CKTOTAL, CKMB, CKMBINDEX, TROPONINIHS in the last 72 hours. BNP: No results for input(s): BNP in the last 72 hours. D-Dimer: No results for input(s): DDIMER in the last 72 hours. Hemoglobin A1C: No results for input(s): HGBA1C in the last 72 hours. Fasting Lipid Panel: No results for input(s): CHOL, HDL, LDLCALC, TRIG, CHOLHDL, LDLDIRECT in the last 72 hours. Thyroid  Function Tests: No results for input(s): TSH, T4TOTAL, T3FREE, THYROIDAB in the last 72 hours.  Invalid input(s): FREET3 Anemia Panel: No results for input(s): VITAMINB12, FOLATE, FERRITIN, TIBC, IRON,  RETICCTPCT in the last 72 hours.  DG Abd 1 View Result Date: 11/13/2024 CLINICAL DATA:  Abdominal distension. EXAM: DG ABDOMEN 1V COMPARISON:  CT 08/26/2021 FINDINGS: Air scattered throughout nondilated small and large bowel. Small volume of formed stool in the colon. Peripherally calcified splenic cyst is unchanged from prior exam. Cholecystectomy clips in the right upper quadrant. No evidence of free air. IMPRESSION: Nonobstructive bowel gas pattern. Electronically Signed   By: Andrea Gasman M.D.   On: 11/13/2024 18:46    ECHO 10/2024: 1. Left ventricular ejection fraction, by estimation, is 50 to 55%. The left ventricle has normal function.  The left ventricle demonstrates regional wall motion abnormalities (see scoring diagram/findings for description). Left ventricular diastolic parameters are consistent with Grade I diastolic dysfunction (impaired relaxation). There is severe hypokinesis of the left ventricular, mid-apical anteroseptal wall and apical segment.   2. Right ventricular systolic function is normal. The right ventricular  size is normal.   3. The mitral valve is degenerative. No evidence of mitral valve regurgitation.   4. The aortic valve is tricuspid. Aortic valve regurgitation is mild. Aortic valve sclerosis is present, with no evidence of aortic valve stenosis.   TELEMETRY (personally reviewed): sinus rhythm rate 60s  EKG (personally reviewed): sinus tach rate 101 bpm  DATA reviewed by me 11/14/2024: last 24h vitals tele labs imaging I/O, hospitalist progress note  Principal Problem:   Pneumonia Active Problems:   CHF (congestive heart failure) (HCC)   PNA (pneumonia)   NSTEMI (non-ST elevated myocardial infarction) (HCC)    ASSESSMENT AND PLAN: Helen Ramsey is a 89 y.o. female with a past medical history of chronic HFpEF, HTN, chronic hypoxic respiratory failure on 2 L continuously, CKD stage IIIb, RA on methotrexate  who presented to the ED on 11/09/2024 for SOB, cough. Cardiology was consulted for further evaluation.   # NSTEMI, type I vs type II # Sepsis 2/2 pneumonia # Metapneumovirus # Chronic HFpEF # Chronic hypoxic respiratory failure # Hx DVT Patient presented with cough, SOB, fever. Found to have pneumonia and met sepsis criteria. Now weaned to baseline O2 requirement. BNP 808, low suspicion for acute heart failure. Troponins 57 > 314 > 496 > 664 > 269 > 237. Dr. Florencio recommended medical management.  -Continue eliquis  5 mg twice daily.  -Continue amlodipine  5 mg daily.  -Continue torsemide  40 mg BID.   Cardiology will sign off. Please haiku with questions or re-engage if needed.  Follow up with Dr. Florencio in 1-2 weeks.   This patient's case was discussed and created with Dr. Wilburn and he is in agreement.  Signed:  Danita Bloch, PA-C  11/14/2024, 12:53 PM [facility] Cardiology

## 2024-11-14 NOTE — Discharge Summary (Signed)
 " Physician Discharge Summary   Patient: Helen Ramsey MRN: [id number] DOB: May 24, 1933  Admit date:     11/09/2024  Discharge date: 11/14/2024  Discharge Physician: Laree Lock   PCP: Myrla Jon CHRISTELLA, MD   Recommendations at discharge:  Follow-up with LTC provider -repeat CMP, CBC within 5 days Monitor BP and titrate meds as needed  Follow-up with cardiology outpatient in 1 week Has home oxygen  Continue physical therapy  Discharge Diagnoses: Principal Problem:   Pneumonia Active Problems:   CHF (congestive heart failure) (HCC)   PNA (pneumonia)   NSTEMI (non-ST elevated myocardial infarction) [facility])  Hospital Course: Helen Ramsey is a 89 y.o. female with medical history significant of chronic HFpEF, HTN, chronic hypoxic respiratory failure on 2 L continuously, CKD stage IIIb, RA on methotrexate , presented with worsening of cough shortness of breath and fever.  Patient has been having productive cough for about 1 week, started to have yellow sputum production and dyspnea on exertion.  Patient was admitted due to acute on chronic hypoxic respiratory failure, multifocal pneumonia,  Hospital course as below  Acute on chronic hypoxic respiratory failure Suspect multifactorial due to multifocal pneumonia, metapneumovirus infection, asthma/COPD exacerbation, chronic HFpEF Was on BiPAP on presentation, was able to wean to 2L [place], which is baseline   Severe Sepsis due to Multifocal pneumonia Metapneumovirus infection Lactic acidosis improving, Leukocytosis resolved MRSA nares positive, respiratory culture with rare MRSA Completed course of antibiotics with MRSA coverage Legionella, mycoplasma negativee Seen by SLP   Acute asthma/COPD exacerbation S/p Azithromycin , on prednisone  taper Home inhalers   Chronic HFpEF S/p IV Lasix , on p.o. torsemide    Elevated troponin Elevated troponin, Suspect demand ischemia due to pneumonia, CHF, asthma/COPD exacerbation Echo shows  EF 50 to 55%, severe hypokinesis of LV, mid apical anterior septal wall, grade 1 DD, mild AR Discussed with cardiology -unlikely to benefit from ischemic workup S/p IV heparin  Follow-up with cardiology outpatient in 1 week   Hypokalemia Repleted On potassium supplements at home   CKD stage 3a/b Stable  Acute transaminitis - improving US  abd status postcholecystectomy, probable hepatic steatosis Trend LFT's  Constipation Last BM 12/28 AM Passing gas, no abdominal pain. On bowel regimen at home   HLD LDL 123, not compliant with statins   HTN amlodipine  5 mg   History of DVT Eliquis    Anxiety/depression Continue SSRI, Buspar    GERD Pepcid , PPI   Rheumatoid arthritis On Methotrexate    Obesity class I Body mass index is 32.24 kg/m. - Outpatient follow up for lifestyle modification and risk factor management   PT/OT eval rec PT at LTC   Pain control - [place]  Controlled Substance Reporting System database was reviewed. and patient was instructed, not to drive, operate heavy machinery, perform activities at heights, swimming or participation in water activities or provide baby-sitting services while on Pain, Sleep and Anxiety Medications; until their outpatient Physician has advised to do so again. Also recommended to not to take more than prescribed Pain, Sleep and Anxiety Medications.  Consultants: Cardiology Procedures performed: None  Disposition: Long term care facility Diet recommendation:  Discharge Diet Orders (From admission, onward)     Start     Ordered   11/14/24 0000  Diet - low sodium heart healthy        11/14/24 1352           DISCHARGE MEDICATION: Allergies as of 11/14/2024       Reactions   Amoxicillin    Clindamycin/lincomycin  Doxycycline    Levaquin [levofloxacin In D5w]    Mirabegron Nausea And Vomiting   Nsaids Other (See Comments)   GI upset   Oxycodone  Nausea Only   Other reaction(s): Hallucination   Phenergan  [promethazine Hcl]    Singulair [montelukast]    Vicodin [hydrocodone-acetaminophen ]    Zocor [simvastatin]    Zoloft  [sertraline] Other (See Comments)   Ampicillin Rash   Bactrim [sulfamethoxazole-trimethoprim] Rash   Levofloxacin Other (See Comments)   Weakness   Penicillins Rash   Ampicillin, Clindamycin, Doxycycline Levaquin-severe headache, muscle and joint aches, weak   Tolmetin    Other reaction(s): Other (See Comments) GI upset        Medication List     TAKE these medications    acetaminophen  500 MG tablet Commonly known as: TYLENOL  Take 1,000 mg by mouth in the morning and at bedtime.   albuterol  108 (90 Base) MCG/ACT inhaler Commonly known as: VENTOLIN  HFA Inhale 2 puffs into the lungs every 6 (six) hours as needed for wheezing or shortness of breath.   amLODipine  5 MG tablet Commonly known as: NORVASC  Take 5 mg by mouth daily.   apixaban  5 MG Tabs tablet Commonly known as: ELIQUIS  Take 1 tablet (5 mg total) by mouth 2 (two) times daily.   atorvastatin  20 MG tablet Commonly known as: LIPITOR Take 1 tablet (20 mg total) by mouth daily.   busPIRone  5 MG tablet Commonly known as: BUSPAR  Take 5 mg by mouth 3 (three) times daily.   calamine lotion Apply 1 Application topically daily as needed for itching.   citalopram  40 MG tablet Commonly known as: CELEXA  Take 1 tablet (40 mg total) by mouth daily.   cyanocobalamin  1000 MCG tablet Take 1,000 mcg by mouth daily.   Emgality (300 MG Dose) 100 MG/ML Sosy Generic drug: Galcanezumab-gnlm Inject into the skin.   famotidine  20 MG tablet Commonly known as: PEPCID  Take 1 tablet (20 mg total) by mouth daily.   ferrous sulfate  325 (65 FE) MG tablet Take 325 mg by mouth daily with supper.   Fish Oil 1200 MG Caps Take 1,200 mg by mouth daily.   FLUORIDE TOOTHPASTE DT Place onto teeth daily.   fluticasone  furoate-vilanterol 100-25 MCG/ACT Aepb Commonly known as: Breo Ellipta  Inhale 1 puff into the  lungs daily.   gabapentin  100 MG capsule Commonly known as: NEURONTIN  Take 2 capsules (200 mg total) by mouth 2 (two) times daily AND 3 capsules (300 mg total) at bedtime.   glycerin  adult 2 g suppository Place 1 suppository rectally as needed for constipation.   guaiFENesin  600 MG 12 hr tablet Commonly known as: MUCINEX  Take 600 mg by mouth. Once daily for 7 days   hydroxychloroquine  200 MG tablet Commonly known as: PLAQUENIL  Take 200 mg by mouth daily.   ipratropium-albuterol  0.5-2.5 (3) MG/3ML Soln Commonly known as: DUONEB Inhale 3 mLs into the lungs 3 (three) times daily as needed.   latanoprost  0.005 % ophthalmic solution Commonly known as: XALATAN  Place 1 drop into both eyes at bedtime.   levothyroxine  50 MCG tablet Commonly known as: SYNTHROID  TAKE 1 TABLET(50 MCG) BY MOUTH DAILY BEFORE BREAKFAST What changed:  how much to take how to take this when to take this   linaclotide  145 MCG Caps capsule Commonly known as: Linzess  Take 1 capsule (145 mcg total) by mouth daily before breakfast.   MAGNESIUM  GLYCINATE PO Take 400 mg by mouth daily.   magnesium  hydroxide 400 MG/5ML suspension Commonly known as: MILK OF  MAGNESIA Take 5 mLs by mouth daily as needed for mild constipation.   melatonin 5 MG Tabs Take 5 mg by mouth at bedtime as needed.   methotrexate  2.5 MG tablet Commonly known as: RHEUMATREX Take 10 mg by mouth once a week.   MIRALAX  PO Take 17 g by mouth daily.   mirtazapine  7.5 MG tablet Commonly known as: REMERON  Take 7.5 mg by mouth at bedtime.   omeprazole  20 MG capsule Commonly known as: PRILOSEC TAKE 1 CAPSULE(20 MG) BY MOUTH DAILY   phenazopyridine  95 MG tablet Commonly known as: AZO-TABS Take 1 tablet (95 mg total) by mouth as needed. What changed: when to take this   potassium gluconate 595 (99 K) MG Tabs tablet Take 1 tablet (595 mg total) by mouth 2 (two) times daily.   predniSONE  20 MG tablet Commonly known as:  DELTASONE  Take 2 tablets (40 mg total) by mouth daily with breakfast for 1 day, THEN 1.5 tablets (30 mg total) daily with breakfast for 3 days, THEN 1 tablet (20 mg total) daily with breakfast for 3 days, THEN 0.5 tablets (10 mg total) daily with breakfast for 3 days. Start taking on: November 15, 2024   senna 8.6 MG tablet Commonly known as: SENOKOT Take 1 tablet (8.6 mg total) by mouth daily.   sodium chloride  0.65 % nasal spray Commonly known as: OCEAN Place 1 spray into the nose as needed.   Torsemide  40 MG Tabs Take 40 mg by mouth 2 (two) times daily.   traMADol  50 MG tablet Commonly known as: ULTRAM  Take 1 tablet (50 mg total) by mouth 3 (three) times daily as needed.   triamcinolone  cream 0.1 % Commonly known as: KENALOG  2 weeks on and off 2 weeks   VITAMIN D3 PO Take 1,000 Units by mouth.        Follow-up Information     Florencio Cara BIRCH, MD. Go in 1 week(s).   Specialties: Cardiology, Internal Medicine Contact information: [address] [place] [address] [phone]                Discharge Exam: Fredricka Weights   11/11/24 0500 11/12/24 0500 11/14/24 0500  Weight: 67.4 kg 67.2 kg 67.3 kg    General exam: Calm, comfortable, no acute distress Neck: normal, supple, no masses, no thyromegaly Respiratory: Clear to auscultation bilaterally Cardiovascular: Regular rate and rhythm, no murmurs / rubs / gallops.  1+ extremity edema predominantly on the left ankle Abdomen: no tenderness, no masses palpated, distended. No hepatosplenomegaly. Bowel sounds positive Skin: no rashes, lesions, ulcers. No induration Neurologic: No gross focal deficits  Condition at discharge: fair  The results of significant diagnostics from this hospitalization (including imaging, microbiology, ancillary and laboratory) are listed below for reference.   Imaging Studies: DG Abd 1 View Result Date: 11/13/2024 CLINICAL DATA:  Abdominal distension. EXAM: DG ABDOMEN  1V COMPARISON:  CT 08/26/2021 FINDINGS: Air scattered throughout nondilated small and large bowel. Small volume of formed stool in the colon. Peripherally calcified splenic cyst is unchanged from prior exam. Cholecystectomy clips in the right upper quadrant. No evidence of free air. IMPRESSION: Nonobstructive bowel gas pattern. Electronically Signed   By: Andrea Gasman M.D.   On: 11/13/2024 18:46   CT HEAD WO CONTRAST ( ) Result Date: 11/11/2024 EXAM: CT HEAD WITHOUT CONTRAST 11/11/2024 03:22:10 PM TECHNIQUE: CT of the head was performed without the administration of intravenous contrast. Automated exposure control, iterative reconstruction, and/or weight based adjustment of the mA/kV was utilized to  reduce the radiation dose to as low as reasonably achievable. COMPARISON: 09/09/2024 CLINICAL HISTORY: lethargic, encephalopathy FINDINGS: BRAIN AND VENTRICLES: No acute hemorrhage. No evidence of acute infarct. No hydrocephalus. No extra-axial collection. No mass effect or midline shift. Stable background of moderate chronic small vessel disease. Mild generalized cerebral atrophy. ORBITS: No acute abnormality. SINUSES: Moderate pansinus disease with air fluid levels throughout. SOFT TISSUES AND SKULL: No acute soft tissue abnormality. No skull fracture. Unchanged chronic defect in the anterior nasal septum. Bilateral middle ear effusions. IMPRESSION: 1. No acute intracranial abnormality. 2. Moderate pansinusitis with air-fluid levels throughout and bilateral middle ear effusions. Electronically signed by: [name] MD 11/11/2024 03:33 PM EST RP Workstation: [id number]   ECHOCARDIOGRAM COMPLETE Result Date: 11/11/2024    ECHOCARDIOGRAM REPORT   Patient Name:   MAKAILAH SLAVICK Date of Exam: 11/11/2024 Medical Rec #:  [id number]        Height:       59.0 in Accession #:    [id number]       Weight:       148.6 lb Date of Birth:  1933-02-17        BSA:          1.626 m Patient Age:    89 years         BP:            145/64 mmHg Patient Gender: F                HR:           83 bpm. Exam Location:  [facility] Procedure: 2D Echo, Cardiac Doppler, Color Doppler and Intracardiac            Opacification Agent (Both Spectral and Color Flow Doppler were            utilized during procedure). Indications:     Congestive Heart Failure I50.9  History:         Patient has prior history of Echocardiogram examinations, most                  recent 12/11/2022. CHF; Signs/Symptoms:Dyspnea.  Sonographer:     Ashley McNeely-Sloane Referring Phys:  [id number] [facility] Cecilee Rosner Diagnosing Phys: Redell Cave MD IMPRESSIONS  1. Left ventricular ejection fraction, by estimation, is 50 to 55%. The left ventricle has normal function. The left ventricle demonstrates regional wall motion abnormalities (see scoring diagram/findings for description). Left ventricular diastolic parameters are consistent with Grade I diastolic dysfunction (impaired relaxation). There is severe hypokinesis of the left ventricular, mid-apical anteroseptal wall and apical segment.  2. Right ventricular systolic function is normal. The right ventricular size is normal.  3. The mitral valve is degenerative. No evidence of mitral valve regurgitation.  4. The aortic valve is tricuspid. Aortic valve regurgitation is mild. Aortic valve sclerosis is present, with no evidence of aortic valve stenosis. FINDINGS  Left Ventricle: Left ventricular ejection fraction, by estimation, is 50 to 55%. The left ventricle has normal function. The left ventricle demonstrates regional wall motion abnormalities. Severe hypokinesis of the left ventricular, mid-apical anteroseptal wall and apical segment. Definity  contrast agent was given IV to delineate the left ventricular endocardial borders. The left ventricular internal cavity size was normal in size. There is no left ventricular hypertrophy. Left ventricular diastolic parameters are consistent with Grade I diastolic dysfunction (impaired  relaxation). Right Ventricle: The right ventricular size is normal. No increase in right ventricular wall thickness. Right ventricular systolic function is normal. Left Atrium:  Left atrial size was normal in size. Right Atrium: Right atrial size was normal in size. Pericardium: There is no evidence of pericardial effusion. Mitral Valve: The mitral valve is degenerative in appearance. Mild to moderate mitral annular calcification. No evidence of mitral valve regurgitation. MV peak gradient, 10.5 mmHg. The mean mitral valve gradient is 4.0 mmHg. Tricuspid Valve: The tricuspid valve is normal in structure. Tricuspid valve regurgitation is not demonstrated. Aortic Valve: The aortic valve is tricuspid. Aortic valve regurgitation is mild. Aortic regurgitation PHT measures 387 msec. Aortic valve sclerosis is present, with no evidence of aortic valve stenosis. Aortic valve mean gradient measures 3.0 mmHg. Aortic valve peak gradient measures 6.1 mmHg. Aortic valve area, by VTI measures 2.11 cm. Pulmonic Valve: The pulmonic valve was not well visualized. Pulmonic valve regurgitation is not visualized. Aorta: The aortic root and ascending aorta are structurally normal, with no evidence of dilitation. Venous: The inferior vena cava was not well visualized. IAS/Shunts: No atrial level shunt detected by color flow Doppler.  LEFT VENTRICLE PLAX 2D LVIDd:         4.20 cm     Diastology LVIDs:         2.30 cm     LV e' medial:    3.29 cm/s LV PW:         0.90 cm     LV E/e' medial:  21.5 LV IVS:        1.40 cm     LV e' lateral:   5.92 cm/s LVOT diam:     1.70 cm     LV E/e' lateral: 11.9 LV SV:         49 LV SV Index:   30 LVOT Area:     2.27 cm  LV Volumes (MOD) LV vol d, MOD A2C: 54.6 ml LV vol d, MOD A4C: 76.1 ml LV vol s, MOD A2C: 38.1 ml LV vol s, MOD A4C: 38.4 ml LV SV MOD A2C:     16.5 ml LV SV MOD A4C:     76.1 ml LV SV MOD BP:      28.5 ml RIGHT VENTRICLE RV Basal diam:  3.90 cm RV Mid diam:    3.90 cm RV S prime:      11.20 cm/s TAPSE (M-mode): 1.6 cm LEFT ATRIUM             Index        RIGHT ATRIUM           Index LA diam:        4.00 cm 2.46 cm/m   RA Area:     14.40 cm LA Vol (A2C):   63.8 ml 39.25 ml/m  RA Volume:   32.00 ml  19.69 ml/m LA Vol (A4C):   44.9 ml 27.62 ml/m LA Biplane Vol: 53.9 ml 33.16 ml/m  AORTIC VALVE                    PULMONIC VALVE AV Area (Vmax):    2.09 cm     PV Vmax:        1.09 m/s AV Area (Vmean):   2.03 cm     PV Vmean:       75.700 cm/s AV Area (VTI):     2.11 cm     PV VTI:         0.191 m AV Vmax:           123.00 cm/s  PV Peak grad:  4.8 mmHg AV Vmean:          85.600 cm/s  PV Mean grad:   3.0 mmHg AV VTI:            0.231 m      RVOT Peak grad: 3 mmHg AV Peak Grad:      6.1 mmHg AV Mean Grad:      3.0 mmHg LVOT Vmax:         113.00 cm/s LVOT Vmean:        76.600 cm/s LVOT VTI:          0.215 m LVOT/AV VTI ratio: 0.93 AI PHT:            387 msec  AORTA Ao Root diam: 2.90 cm Ao Asc diam:  3.40 cm MITRAL VALVE MV Area (PHT): 3.72 cm     SHUNTS MV Area VTI:   1.50 cm     Systemic VTI:  0.22 m MV Peak grad:  10.5 mmHg    Systemic Diam: 1.70 cm MV Mean grad:  4.0 mmHg     Pulmonic VTI:  0.127 m MV Vmax:       1.62 m/s MV Vmean:      93.6 cm/s MV Decel Time: 204 msec MV E velocity: 70.60 cm/s MV A velocity: 143.00 cm/s MV E/A ratio:  0.49 Redell Cave MD Electronically signed by Redell Cave MD Signature Date/Time: 11/11/2024/1:16:57 PM    Final    DG Chest 1 View Result Date: 11/10/2024 CLINICAL DATA:  Shortness of breath EXAM: CHEST  1 VIEW COMPARISON:  11/10/2024, 11/09/2024, 01/28/2023 FINDINGS: Stable cardiomediastinal silhouette with aortic atherosclerosis. New heterogeneous airspace disease within the right thorax compared to prior. Patchy airspace disease left lung base. Stable/chronic rim calcification in the left upper quadrant/spleen. IMPRESSION: New heterogeneous airspace disease within the right thorax and left lung base, suspicious for multifocal pneumonia.  Underlying cardiomegaly and diffuse interstitial opacities suggestive of edema. Possible small right-sided pleural effusion Electronically Signed   By: Luke Bun M.D.   On: 11/10/2024 16:58   DG Chest 1 View Result Date: 11/10/2024 CLINICAL DATA:  CHF. EXAM: CHEST  1 VIEW COMPARISON:  1224.5 FINDINGS: The cardio pericardial silhouette is enlarged. Vascular congestion diffuse interstitial opacity is similar to minimally progressive in the interval. No substantial pleural effusion. Rim calcified splenic lesion again noted. Telemetry leads overlie the chest. IMPRESSION: Similar to minimally progressive diffuse interstitial opacity suggesting edema. Electronically Signed   By: Camellia Candle M.D.   On: 11/10/2024 07:14   US  Abdomen Limited RUQ (LIVER/GB) Result Date: 11/09/2024 CLINICAL DATA:  Elevated liver function tests EXAM: ULTRASOUND ABDOMEN LIMITED RIGHT UPPER QUADRANT COMPARISON:  August 26, 2021 FINDINGS: Gallbladder: Status post cholecystectomy Common bile duct: Diameter: 4 mm which is within normal limits Liver: No focal lesion identified. Increased echogenicity of hepatic parenchyma is noted suggesting hepatic steatosis. Portal vein is patent on color Doppler imaging with normal direction of blood flow towards the liver. Other: None. IMPRESSION: Status post cholecystectomy. Probable hepatic steatosis. Electronically Signed   By: Lynwood Landy Raddle M.D.   On: 11/09/2024 12:59   DG Chest Port 1 View Result Date: 11/09/2024 EXAM: 1 VIEW(S) XRAY OF THE CHEST 11/09/2024 04:10:00 AM COMPARISON: Portable chest 01/28/2023. CLINICAL HISTORY: Questionable sepsis - evaluate for abnormality. FINDINGS: LUNGS AND PLEURA: There is streaky opacity in the retrocardiac left lower lobe, findings are similar to the prior study and could represent scarring, chronic atelectasis, pneumonia or aspiration. The lungs are otherwise clear.  No pleural effusion. No pneumothorax. HEART AND MEDIASTINUM: There is tortuosity and  atherosclerosis of the aorta with a stable mediastinum. No acute abnormality of the cardiac silhouette. BONES AND SOFT TISSUES: Osteopenia and degenerative change of the thoracic spine. There is a densely calcified splenic mass in the left upper abdomen, seen previously. IMPRESSION: 1. Streaky opacity in the retrocardiac left lower lobe, similar to the prior study, which may represent scarring or chronic atelectasis, with pneumonia or aspiration not excluded. Electronically signed by: Francis Quam MD 11/09/2024 05:05 AM EST RP Workstation: [id number]    Microbiology: Results for orders placed or performed during the hospital encounter of 11/09/24  Resp panel by RT-PCR (RSV, Flu A&B, Covid) Anterior Nasal Swab     Status: None   Collection Time: 11/09/24  3:56 AM   Specimen: Anterior Nasal Swab  Result Value Ref Range Status   SARS Coronavirus 2 by RT PCR NEGATIVE NEGATIVE Final    Comment: (NOTE) SARS-CoV-2 target nucleic acids are NOT DETECTED.  The SARS-CoV-2 RNA is generally detectable in upper respiratory specimens during the acute phase of infection. The lowest concentration of SARS-CoV-2 viral copies this assay can detect is 138 copies/mL. A negative result does not preclude SARS-Cov-2 infection and should not be used as the sole basis for treatment or other patient management decisions. A negative result may occur with  improper specimen collection/handling, submission of specimen other than nasopharyngeal swab, presence of viral mutation(s) within the areas targeted by this assay, and inadequate number of viral copies(<138 copies/mL). A negative result must be combined with clinical observations, patient history, and epidemiological information. The expected result is Negative.  Fact Sheet for Patients:  bloggercourse.com  Fact Sheet for Healthcare Providers:  seriousbroker.it  This test is no t yet approved or cleared by the  [place]  FDA and  has been authorized for detection and/or diagnosis of SARS-CoV-2 by FDA under an Emergency Use Authorization (EUA). This EUA will remain  in effect (meaning this test can be used) for the duration of the COVID-19 declaration under Section 564(b)(1) of the Act, 21 U.S.C.section 360bbb-3(b)(1), unless the authorization is terminated  or revoked sooner.       Influenza A by PCR NEGATIVE NEGATIVE Final   Influenza B by PCR NEGATIVE NEGATIVE Final    Comment: (NOTE) The Xpert Xpress SARS-CoV-2/FLU/RSV plus assay is intended as an aid in the diagnosis of influenza from Nasopharyngeal swab specimens and should not be used as a sole basis for treatment. Nasal washings and aspirates are unacceptable for Xpert Xpress SARS-CoV-2/FLU/RSV testing.  Fact Sheet for Patients: bloggercourse.com  Fact Sheet for Healthcare Providers: seriousbroker.it  This test is not yet approved or cleared by the [place]  FDA and has been authorized for detection and/or diagnosis of SARS-CoV-2 by FDA under an Emergency Use Authorization (EUA). This EUA will remain in effect (meaning this test can be used) for the duration of the COVID-19 declaration under Section 564(b)(1) of the Act, 21 U.S.C. section 360bbb-3(b)(1), unless the authorization is terminated or revoked.     Resp Syncytial Virus by PCR NEGATIVE NEGATIVE Final    Comment: (NOTE) Fact Sheet for Patients: bloggercourse.com  Fact Sheet for Healthcare Providers: seriousbroker.it  This test is not yet approved or cleared by the [place]  FDA and has been authorized for detection and/or diagnosis of SARS-CoV-2 by FDA under an Emergency Use Authorization (EUA). This EUA will remain in effect (meaning this test can be used) for the duration of the COVID-19 declaration under  Section 564(b)(1) of the Act, 21 U.S.C. section  360bbb-3(b)(1), unless the authorization is terminated or revoked.  Performed at [facility], [address] [place], [place] [address]   Blood Culture (routine x 2)     Status: None   Collection Time: 11/09/24  3:56 AM   Specimen: BLOOD  Result Value Ref Range Status   Specimen Description BLOOD LEFT ANTECUBITAL  Final   Special Requests   Final    BOTTLES DRAWN AEROBIC AND ANAEROBIC Blood Culture adequate volume   Culture   Final    NO GROWTH 5 DAYS Performed at [facility], [address] [place], [place] [address]    Report Status 11/14/2024 FINAL  Final  Blood Culture (routine x 2)     Status: None   Collection Time: 11/09/24  3:57 AM   Specimen: BLOOD  Result Value Ref Range Status   Specimen Description BLOOD BLOOD LEFT ARM  Final   Special Requests   Final    BOTTLES DRAWN AEROBIC AND ANAEROBIC Blood Culture adequate volume   Culture   Final    NO GROWTH 5 DAYS Performed at [facility], [address] [place], [place] [address]    Report Status 11/14/2024 FINAL  Final  Respiratory (~20 pathogens) panel by PCR     Status: Abnormal   Collection Time: 11/10/24 12:40 PM   Specimen: Nasopharyngeal Swab; Respiratory  Result Value Ref Range Status   Adenovirus NOT DETECTED NOT DETECTED Final   Coronavirus 229E NOT DETECTED NOT DETECTED Final    Comment: (NOTE) The Coronavirus on the Respiratory Panel, DOES NOT test for the novel  Coronavirus (2019 nCoV)    Coronavirus HKU1 NOT DETECTED NOT DETECTED Final   Coronavirus NL63 NOT DETECTED NOT DETECTED Final   Coronavirus OC43 NOT DETECTED NOT DETECTED Final   Metapneumovirus DETECTED (A) NOT DETECTED Final   Rhinovirus / Enterovirus NOT DETECTED NOT DETECTED Final   Influenza A NOT DETECTED NOT DETECTED Final   Influenza B NOT DETECTED NOT DETECTED Final   Parainfluenza Virus 1 NOT DETECTED NOT DETECTED Final   Parainfluenza Virus 2 NOT DETECTED NOT DETECTED Final   Parainfluenza  Virus 3 NOT DETECTED NOT DETECTED Final   Parainfluenza Virus 4 NOT DETECTED NOT DETECTED Final   Respiratory Syncytial Virus NOT DETECTED NOT DETECTED Final   Bordetella pertussis NOT DETECTED NOT DETECTED Final   Bordetella Parapertussis NOT DETECTED NOT DETECTED Final   Chlamydophila pneumoniae NOT DETECTED NOT DETECTED Final   Mycoplasma pneumoniae NOT DETECTED NOT DETECTED Final    Comment: Performed at [facility], [address] [place], [place] [address]  MRSA Next Gen by PCR, Nasal     Status: Abnormal   Collection Time: 11/10/24 12:40 PM   Specimen: Nasal Mucosa; Nasal Swab  Result Value Ref Range Status   MRSA by PCR Next Gen DETECTED (A) NOT DETECTED Final    Comment: RESULT CALLED TO, READ BACK BY AND VERIFIED WITH: PRAWEENA BHUSAL RN AT 1532 ON 11/10/2024 BY KC (NOTE) The GeneXpert MRSA Assay (FDA approved for NASAL specimens only), is one component of a comprehensive MRSA colonization surveillance program. It is not intended to diagnose MRSA infection nor to guide or monitor treatment for MRSA infections. Test performance is not FDA approved in patients less than 38 years old. Performed at [facility], [address] [place], [place] [address]   Expectorated Sputum Assessment w Gram Stain, Rflx to Resp Cult  Status: None   Collection Time: 11/10/24  2:30 PM   Specimen: Sputum  Result Value Ref Range Status   Specimen Description SPUTUM  Final   Special Requests NONE  Final   Sputum evaluation   Final    THIS SPECIMEN IS ACCEPTABLE FOR SPUTUM CULTURE Performed at [facility], [address] [place], [place] [address]    Report Status 11/10/2024 FINAL  Final  Culture, Respiratory w Gram Stain     Status: None   Collection Time: 11/10/24  2:30 PM   Specimen: SPU  Result Value Ref Range Status   Specimen Description   Final    SPUTUM Performed at [facility], [address], [place], [place] [address]    Special  Requests   Final    NONE Reflexed from [id number] Performed at [facility], [address] [place], [place] [address]    Gram Stain   Final    FEW WBC PRESENT, PREDOMINANTLY PMN RARE GRAM POSITIVE COCCI RARE GRAM NEGATIVE RODS Performed at [facility], [address] [place], [place] [address]    Culture RARE METHICILLIN RESISTANT STAPHYLOCOCCUS AUREUS  Final   Report Status 11/13/2024 FINAL  Final   Organism ID, Bacteria METHICILLIN RESISTANT STAPHYLOCOCCUS AUREUS  Final      Susceptibility   Methicillin resistant staphylococcus aureus - MIC*    CIPROFLOXACIN  >=8 RESISTANT Resistant     ERYTHROMYCIN >=8 RESISTANT Resistant     GENTAMICIN <=0.5 SENSITIVE Sensitive     OXACILLIN >=4 RESISTANT Resistant     TETRACYCLINE <=1 SENSITIVE Sensitive     VANCOMYCIN  1 SENSITIVE Sensitive     TRIMETH/SULFA <=10 SENSITIVE Sensitive     CLINDAMYCIN <=0.25 SENSITIVE Sensitive     RIFAMPIN <=0.5 SENSITIVE Sensitive     Inducible Clindamycin NEGATIVE Sensitive     LINEZOLID  2 SENSITIVE Sensitive     * RARE METHICILLIN RESISTANT STAPHYLOCOCCUS AUREUS    Labs: CBC: Recent Labs  Lab 11/09/24 0356 11/10/24 0252 11/11/24 0428 11/12/24 0359 11/13/24 0333 11/14/24 0917  WBC 13.7* 9.3 14.2* 7.6 7.8 7.4  NEUTROABS 12.2*  --   --   --   --   --   HGB 12.7 11.9* 12.9 11.1* 12.0 12.3  HCT 38.5 35.5* 38.0 34.1* 35.5* 37.2  MCV 88.7 89.4 88.6 91.2 88.8 89.6  PLT 232 236 262 199 188 191   Basic Metabolic Panel: Recent Labs  Lab 11/10/24 0252 11/11/24 0428 11/12/24 0359 11/13/24 0333 11/13/24 1030 11/14/24 0917  NA 144 141 141 141  --  142  K 3.6 3.2* 4.0 3.6  --  3.2*  CL 103 100 103 103  --  101  CO2 28 29 28 25   --  27  GLUCOSE 111* 170* 134* 150*  --  126*  BUN 38* 30* 38* 43*  --  40*  CREATININE 1.04* 1.02* 1.07* 1.11*  --  1.11*  CALCIUM  9.3 9.0 9.1 9.3  --  9.4  MG  --  2.1  --   --  2.1  --    Liver Function Tests: Recent Labs  Lab 11/09/24 0356  11/10/24 0252 11/12/24 0359 11/14/24 0917  AST 103* 113* 50* 59*  ALT 116* 148* 113* 120*  ALKPHOS 132* 99 82 81  BILITOT 0.4 0.4 0.4 0.4  PROT 7.9 7.0 6.2* 6.4*  ALBUMIN 4.1 3.7 3.3* 3.6   CBG: Recent Labs  Lab 11/10/24 1716 11/13/24 2133 11/14/24 0824  GLUCAP 211* 151* 100*  Discharge time spent: greater than 30 minutes.  Signed: Laree Lock, MD [facility] 11/14/2024 "

## 2024-11-14 NOTE — Plan of Care (Signed)
" °  Problem: Education: Goal: Knowledge of General Education information will improve Description: Including pain rating scale, medication(s)/side effects and non-pharmacologic comfort measures Outcome: Progressing   Problem: Clinical Measurements: Goal: Will remain free from infection Outcome: Progressing   Problem: Clinical Measurements: Goal: Respiratory complications will improve Outcome: Progressing   Problem: Clinical Measurements: Goal: Cardiovascular complication will be avoided Outcome: Progressing   Problem: Activity: Goal: Risk for activity intolerance will decrease Outcome: Progressing   Problem: Nutrition: Goal: Adequate nutrition will be maintained Outcome: Progressing   Problem: Coping: Goal: Level of anxiety will decrease Outcome: Progressing   Problem: Elimination: Goal: Will not experience complications related to urinary retention Outcome: Progressing   Problem: Pain Managment: Goal: General experience of comfort will improve and/or be controlled Outcome: Progressing   Problem: Safety: Goal: Ability to remain free from injury will improve Outcome: Progressing   "
